# Patient Record
Sex: Male | Born: 1955 | Race: White | Hispanic: No | Marital: Married | Smoking: Current every day smoker
Health system: Southern US, Community
[De-identification: ages and names within clinical notes are randomized; demographics above are authoritative.]

## PROBLEM LIST (undated history)

## (undated) DIAGNOSIS — H409 Unspecified glaucoma: Secondary | ICD-10-CM

## (undated) DIAGNOSIS — Z87891 Personal history of nicotine dependence: Secondary | ICD-10-CM

## (undated) DIAGNOSIS — C3490 Malignant neoplasm of unspecified part of unspecified bronchus or lung: Secondary | ICD-10-CM

## (undated) DIAGNOSIS — Z87442 Personal history of urinary calculi: Secondary | ICD-10-CM

## (undated) DIAGNOSIS — R6520 Severe sepsis without septic shock: Secondary | ICD-10-CM

## (undated) DIAGNOSIS — K469 Unspecified abdominal hernia without obstruction or gangrene: Secondary | ICD-10-CM

## (undated) DIAGNOSIS — F329 Major depressive disorder, single episode, unspecified: Secondary | ICD-10-CM

## (undated) DIAGNOSIS — Z1389 Encounter for screening for other disorder: Secondary | ICD-10-CM

## (undated) DIAGNOSIS — Z933 Colostomy status: Secondary | ICD-10-CM

## (undated) DIAGNOSIS — M1710 Unilateral primary osteoarthritis, unspecified knee: Secondary | ICD-10-CM

## (undated) DIAGNOSIS — K5792 Diverticulitis of intestine, part unspecified, without perforation or abscess without bleeding: Secondary | ICD-10-CM

## (undated) DIAGNOSIS — F32A Depression, unspecified: Secondary | ICD-10-CM

## (undated) DIAGNOSIS — C801 Malignant (primary) neoplasm, unspecified: Secondary | ICD-10-CM

## (undated) DIAGNOSIS — Z95828 Presence of other vascular implants and grafts: Secondary | ICD-10-CM

## (undated) DIAGNOSIS — M179 Osteoarthritis of knee, unspecified: Secondary | ICD-10-CM

## (undated) DIAGNOSIS — Z720 Tobacco use: Secondary | ICD-10-CM

## (undated) DIAGNOSIS — I10 Essential (primary) hypertension: Secondary | ICD-10-CM

## (undated) DIAGNOSIS — K6389 Other specified diseases of intestine: Secondary | ICD-10-CM

## (undated) DIAGNOSIS — N200 Calculus of kidney: Secondary | ICD-10-CM

## (undated) DIAGNOSIS — D649 Anemia, unspecified: Secondary | ICD-10-CM

## (undated) DIAGNOSIS — R0600 Dyspnea, unspecified: Secondary | ICD-10-CM

## (undated) DIAGNOSIS — J9620 Acute and chronic respiratory failure, unspecified whether with hypoxia or hypercapnia: Secondary | ICD-10-CM

## (undated) DIAGNOSIS — G709 Myoneural disorder, unspecified: Secondary | ICD-10-CM

## (undated) DIAGNOSIS — A419 Sepsis, unspecified organism: Secondary | ICD-10-CM

## (undated) DIAGNOSIS — F1290 Cannabis use, unspecified, uncomplicated: Secondary | ICD-10-CM

## (undated) DIAGNOSIS — R7303 Prediabetes: Secondary | ICD-10-CM

## (undated) DIAGNOSIS — Z1211 Encounter for screening for malignant neoplasm of colon: Secondary | ICD-10-CM

## (undated) DIAGNOSIS — C3411 Malignant neoplasm of upper lobe, right bronchus or lung: Secondary | ICD-10-CM

## (undated) DIAGNOSIS — Z8614 Personal history of Methicillin resistant Staphylococcus aureus infection: Secondary | ICD-10-CM

## (undated) DIAGNOSIS — Z972 Presence of dental prosthetic device (complete) (partial): Secondary | ICD-10-CM

## (undated) DIAGNOSIS — J189 Pneumonia, unspecified organism: Secondary | ICD-10-CM

## (undated) DIAGNOSIS — J449 Chronic obstructive pulmonary disease, unspecified: Secondary | ICD-10-CM

## (undated) DIAGNOSIS — E876 Hypokalemia: Secondary | ICD-10-CM

## (undated) DIAGNOSIS — E119 Type 2 diabetes mellitus without complications: Secondary | ICD-10-CM

## (undated) HISTORY — DX: Encounter for screening for other disorder: Z13.89

## (undated) HISTORY — DX: Calculus of kidney: N20.0

## (undated) HISTORY — DX: Diverticulitis of intestine, part unspecified, without perforation or abscess without bleeding: K57.92

## (undated) HISTORY — PX: COLONOSCOPY: SHX174

## (undated) HISTORY — PX: BACK SURGERY: SHX140

## (undated) HISTORY — DX: Unspecified abdominal hernia without obstruction or gangrene: K46.9

## (undated) HISTORY — DX: Unspecified glaucoma: H40.9

## (undated) HISTORY — PX: EYE SURGERY: SHX253

## (undated) HISTORY — PX: JOINT REPLACEMENT: SHX530

## (undated) HISTORY — DX: Encounter for screening for malignant neoplasm of colon: Z12.11

## (undated) HISTORY — DX: Personal history of nicotine dependence: Z87.891

## (undated) MED FILL — Dexamethasone Sodium Phosphate Inj 100 MG/10ML: INTRAMUSCULAR | Qty: 1 | Status: AC

## (undated) MED FILL — Iron Sucrose Inj 20 MG/ML (Fe Equiv): INTRAVENOUS | Qty: 10 | Status: AC

---
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
## 1977-10-04 DIAGNOSIS — K469 Unspecified abdominal hernia without obstruction or gangrene: Secondary | ICD-10-CM

## 1977-10-04 HISTORY — PX: HERNIA REPAIR: SHX51

## 1977-10-04 HISTORY — DX: Unspecified abdominal hernia without obstruction or gangrene: K46.9

## 2010-10-15 ENCOUNTER — Ambulatory Visit: Payer: Self-pay

## 2010-11-11 ENCOUNTER — Ambulatory Visit: Payer: Self-pay | Admitting: Anesthesiology

## 2010-11-13 ENCOUNTER — Ambulatory Visit: Payer: Self-pay | Admitting: Anesthesiology

## 2010-12-10 ENCOUNTER — Ambulatory Visit: Payer: Self-pay | Admitting: Anesthesiology

## 2010-12-30 ENCOUNTER — Ambulatory Visit: Payer: Self-pay | Admitting: Anesthesiology

## 2011-01-20 ENCOUNTER — Ambulatory Visit: Payer: Self-pay | Admitting: Anesthesiology

## 2011-08-23 ENCOUNTER — Ambulatory Visit: Payer: Self-pay

## 2011-10-05 HISTORY — PX: LITHOTRIPSY: SUR834

## 2012-01-07 ENCOUNTER — Emergency Department: Payer: Self-pay | Admitting: *Deleted

## 2012-01-07 LAB — COMPREHENSIVE METABOLIC PANEL
Albumin: 3.8 g/dL (ref 3.4–5.0)
Alkaline Phosphatase: 77 U/L (ref 50–136)
Calcium, Total: 8.6 mg/dL (ref 8.5–10.1)
Creatinine: 0.95 mg/dL (ref 0.60–1.30)
EGFR (African American): >60
EGFR (Non-African Amer.): >60
Glucose: 159 mg/dL — ABNORMAL HIGH (ref 65–99)
Osmolality: 283 (ref 275–301)
SGOT(AST): 28 U/L (ref 15–37)
Sodium: 141 mmol/L (ref 136–145)

## 2012-01-07 LAB — URINALYSIS, COMPLETE
Bacteria: NONE SEEN
Leukocyte Esterase: NEGATIVE
Ph: 5 (ref 4.5–8.0)
RBC,UR: 8 /HPF (ref 0–5)
Specific Gravity: 1.02 (ref 1.003–1.030)
WBC UR: 1 /HPF (ref 0–5)

## 2012-01-07 LAB — CBC
HCT: 42.8 % (ref 40.0–52.0)
MCH: 31.7 pg (ref 26.0–34.0)
MCHC: 33.8 g/dL (ref 32.0–36.0)
MCV: 94 fL (ref 80–100)
Platelet: 211 10*3/uL (ref 150–440)
RBC: 4.56 10*6/uL (ref 4.40–5.90)
RDW: 13.5 % (ref 11.5–14.5)
WBC: 18.1 10*3/uL — ABNORMAL HIGH (ref 3.8–10.6)

## 2012-01-07 LAB — LIPASE, BLOOD: Lipase: 163 U/L (ref 73–393)

## 2012-01-08 ENCOUNTER — Ambulatory Visit: Payer: Self-pay | Admitting: Urology

## 2012-01-08 ENCOUNTER — Inpatient Hospital Stay: Payer: Self-pay | Admitting: Internal Medicine

## 2012-01-08 LAB — COMPREHENSIVE METABOLIC PANEL
Alkaline Phosphatase: 69 U/L (ref 50–136)
Anion Gap: 11 (ref 7–16)
BUN: 12 mg/dL (ref 7–18)
Bilirubin,Total: 1.1 mg/dL — ABNORMAL HIGH (ref 0.2–1.0)
Calcium, Total: 8.4 mg/dL — ABNORMAL LOW (ref 8.5–10.1)
Creatinine: 1.08 mg/dL (ref 0.60–1.30)
EGFR (African American): >60
EGFR (Non-African Amer.): >60
Potassium: 3.8 mmol/L (ref 3.5–5.1)
SGPT (ALT): 20 U/L
Total Protein: 6.9 g/dL (ref 6.4–8.2)

## 2012-01-08 LAB — URINALYSIS, COMPLETE
Bacteria: NONE SEEN
Bilirubin,UR: NEGATIVE
Glucose,UR: 150 mg/dL (ref 0–75)
Ketone: NEGATIVE
Protein: 30
RBC,UR: 129 /HPF (ref 0–5)
Specific Gravity: 1.03 (ref 1.003–1.030)
Squamous Epithelial: <1
WBC UR: 4 /HPF (ref 0–5)

## 2012-01-08 LAB — CBC
HGB: 13.3 g/dL (ref 13.0–18.0)
MCH: 30.8 pg (ref 26.0–34.0)
MCHC: 33.2 g/dL (ref 32.0–36.0)
Platelet: 198 10*3/uL (ref 150–440)
RDW: 12.5 % (ref 11.5–14.5)

## 2012-01-09 LAB — CBC WITH DIFFERENTIAL/PLATELET
Basophil #: 0 10*3/uL (ref 0.0–0.1)
Basophil %: 0.2 %
Eosinophil #: 0 10*3/uL (ref 0.0–0.7)
Eosinophil %: 0.3 %
HCT: 33.3 % — ABNORMAL LOW (ref 40.0–52.0)
HGB: 11.3 g/dL — ABNORMAL LOW (ref 13.0–18.0)
Lymphocyte #: 0.6 10*3/uL — ABNORMAL LOW (ref 1.0–3.6)
MCHC: 34 g/dL (ref 32.0–36.0)
Monocyte #: 0.5 10*3/uL (ref 0.0–0.7)
Monocyte %: 3.8 %
Neutrophil %: 91.1 %
Platelet: 154 10*3/uL (ref 150–440)
RDW: 13.9 % (ref 11.5–14.5)
WBC: 13.9 10*3/uL — ABNORMAL HIGH (ref 3.8–10.6)

## 2012-01-09 LAB — MAGNESIUM: Magnesium: 1.7 mg/dL — ABNORMAL LOW

## 2012-01-09 LAB — BASIC METABOLIC PANEL
Anion Gap: 11 (ref 7–16)
Calcium, Total: 7.9 mg/dL — ABNORMAL LOW (ref 8.5–10.1)
Creatinine: 0.9 mg/dL (ref 0.60–1.30)
EGFR (African American): >60
EGFR (Non-African Amer.): >60
Glucose: 104 mg/dL — ABNORMAL HIGH (ref 65–99)
Osmolality: 287 (ref 275–301)
Potassium: 3.4 mmol/L — ABNORMAL LOW (ref 3.5–5.1)
Sodium: 144 mmol/L (ref 136–145)

## 2012-01-10 LAB — CBC WITH DIFFERENTIAL/PLATELET
Basophil %: 0.1 %
Basophil %: 0.4 %
Eosinophil #: 0 10*3/uL (ref 0.0–0.7)
Eosinophil #: 0.1 10*3/uL (ref 0.0–0.7)
Eosinophil %: 1 %
HCT: 35.6 % — ABNORMAL LOW (ref 40.0–52.0)
HCT: 33.8 % — ABNORMAL LOW (ref 40.0–52.0)
HGB: 11.7 g/dL — ABNORMAL LOW (ref 13.0–18.0)
Lymphocyte #: 0.6 10*3/uL — ABNORMAL LOW (ref 1.0–3.6)
Lymphocyte #: 0.5 10*3/uL — ABNORMAL LOW (ref 1.0–3.6)
Lymphocyte %: 4.5 %
Lymphocyte %: 4.1 %
MCH: 32 pg (ref 26.0–34.0)
MCH: 32.1 pg (ref 26.0–34.0)
MCHC: 34.1 g/dL (ref 32.0–36.0)
MCV: 94 fL (ref 80–100)
MCV: 93 fL (ref 80–100)
Monocyte #: 0.4 10*3/uL (ref 0.0–0.7)
Monocyte #: 0.2 10*3/uL (ref 0.0–0.7)
Monocyte %: 3.3 %
Monocyte %: 1.9 %
Neutrophil #: 10.9 10*3/uL — ABNORMAL HIGH (ref 1.4–6.5)
Neutrophil %: 91.7 %
Neutrophil %: 92.6 %
RBC: 3.63 10*6/uL — ABNORMAL LOW (ref 4.40–5.90)
RDW: 12.8 % (ref 11.5–14.5)
WBC: 11.7 10*3/uL — ABNORMAL HIGH (ref 3.8–10.6)

## 2012-01-10 LAB — MAGNESIUM: Magnesium: 1.8 mg/dL

## 2012-01-10 LAB — OCCULT BLOOD X 1 CARD TO LAB, STOOL: Occult Blood, Feces: NEGATIVE

## 2012-01-12 LAB — CBC WITH DIFFERENTIAL/PLATELET
Basophil #: 0 10*3/uL (ref 0.0–0.1)
Basophil %: 0 %
Eosinophil %: 0.8 %
HCT: 35.4 % — ABNORMAL LOW (ref 40.0–52.0)
MCH: 31.9 pg (ref 26.0–34.0)
MCHC: 34.2 g/dL (ref 32.0–36.0)
MCV: 93 fL (ref 80–100)
Monocyte #: 0.9 x10 3/mm (ref 0.2–1.0)
Neutrophil %: 87.2 %
RBC: 3.79 10*6/uL — ABNORMAL LOW (ref 4.40–5.90)
WBC: 13.1 10*3/uL — ABNORMAL HIGH (ref 3.8–10.6)

## 2012-02-03 ENCOUNTER — Ambulatory Visit: Payer: Self-pay | Admitting: Urology

## 2012-06-22 ENCOUNTER — Ambulatory Visit: Payer: Self-pay | Admitting: Urology

## 2013-02-01 ENCOUNTER — Ambulatory Visit: Payer: Self-pay | Admitting: Hematology and Oncology

## 2013-02-07 LAB — CBC CANCER CENTER
Basophil #: 0.1 x10 3/mm (ref 0.0–0.1)
Basophil %: 1 %
Eosinophil #: 0.3 x10 3/mm (ref 0.0–0.7)
HCT: 43.8 % (ref 40.0–52.0)
HGB: 15.1 g/dL (ref 13.0–18.0)
Lymphocyte #: 2.5 x10 3/mm (ref 1.0–3.6)
MCH: 31.6 pg (ref 26.0–34.0)
MCHC: 34.4 g/dL (ref 32.0–36.0)
Monocyte #: 0.8 x10 3/mm (ref 0.2–1.0)
Monocyte %: 8.4 %
Platelet: 219 x10 3/mm (ref 150–440)
RBC: 4.77 10*6/uL (ref 4.40–5.90)
RDW: 14 % (ref 11.5–14.5)
WBC: 9.8 x10 3/mm (ref 3.8–10.6)

## 2013-03-04 ENCOUNTER — Ambulatory Visit: Payer: Self-pay | Admitting: Hematology and Oncology

## 2013-03-26 ENCOUNTER — Encounter: Payer: Self-pay | Admitting: General Surgery

## 2013-03-26 ENCOUNTER — Ambulatory Visit (INDEPENDENT_AMBULATORY_CARE_PROVIDER_SITE_OTHER): Payer: BC Managed Care – PPO | Admitting: General Surgery

## 2013-03-26 VITALS — BP 140/80 | HR 74 | Resp 12 | Ht 71.0 in | Wt 201.0 lb

## 2013-03-26 DIAGNOSIS — K5732 Diverticulitis of large intestine without perforation or abscess without bleeding: Secondary | ICD-10-CM

## 2013-03-26 NOTE — Progress Notes (Signed)
Patient ID: Rodney Vega, male   DOB: [birth year], 57 y.o.   MRN: [id number]  Chief Complaint  Patient presents with  . Other    divertculitis    HPI Rodney Vega is a 57 y.o. male here today for diverculitis. He states approximately 1 year ago he started having abdominal pains. The first episode required in hospital treatment. He has been diagnosed with diverculitis since that time in April 2013. CT scan confirmed the diagnosis at that time. He states he has been to the doctor approximately 8 times due to this issue. He has been put on antibiotics for this several times in the past yr. The most recent episode was in April 2014. The pain is most in the super pubic abdominal area.  HPI  Past Medical History  Diagnosis Date  . Diverticulitis   . Kidney stone     Past Surgical History  Procedure Laterality Date  . Back surgery  1994, 2012  . Hernia repair  1979  . Tonsillectomy and adenoidectomy  1962    History reviewed. No pertinent family history.  Social History History  Substance Use Topics  . Smoking status: Current Every Day Smoker -- 1.00 packs/day for 20 years  . Smokeless tobacco: Not on file  . Alcohol Use: Yes    No Known Allergies  Current Outpatient Prescriptions  Medication Sig Dispense Refill  . calcium citrate (CALCITRATE - DOSED IN MG ELEMENTAL CALCIUM) 950 MG tablet Take 1 tablet by mouth daily.      . Probiotic Product (PROBIOTIC DAILY PO) Take 1 tablet by mouth daily.       No current facility-administered medications for this visit.    Review of Systems Review of Systems  Constitutional: Negative.   Respiratory: Negative.   Cardiovascular: Negative.   Gastrointestinal: Negative.     Blood pressure 140/80, pulse 74, resp. rate 12, height 5\' 11"  (1.803 m), weight 201 lb (91.173 kg).  Physical Exam Physical Exam  Constitutional: He is oriented to person, place, and time. He appears well-developed and well-nourished.  Eyes: Conjunctivae are  normal. No scleral icterus.  Neck: Trachea normal. No mass and no thyromegaly present.  Cardiovascular: Normal rate, regular rhythm, normal heart sounds and normal pulses.   No murmur heard. Pulmonary/Chest: Effort normal. He has wheezes (scattered bilateral).  Abdominal: Soft. Normal appearance and bowel sounds are normal. There is no hepatosplenomegaly. There is no tenderness. No hernia.  Neurological: He is alert and oriented to person, place, and time.  Skin: Skin is warm and dry.    Data Reviewed  Prior CT and Dr. Orpah Cobb notes  Assessment    Reoccurring diverticulitis of sigmoid colon. I feel it's reasonable to consider sigmoid colectomy. This was discussed in full with patient. The risks and benefits explained to him. Remote need for colostomy and potential complications of surgery explained to him. Patient continues to smoke and advised on the importance of quitting.     Plan    Would like a repeat CT to assess for surgical planning.        Nafeesa Dils G 03/26/2013, 9:05 PM

## 2013-03-26 NOTE — Patient Instructions (Addendum)
Patient to have repeat abdominal CT scan for surgical stand point. Surgery to be schedule after CT Scan. CT scan scheduled at [facility] on Thursday June 26 at 3:00 pm.  Patient to be NPO four hours prior and to pick up a prep kit today.

## 2013-03-29 ENCOUNTER — Ambulatory Visit: Payer: Self-pay | Admitting: General Surgery

## 2013-04-03 ENCOUNTER — Telehealth: Payer: Self-pay | Admitting: *Deleted

## 2013-04-03 NOTE — Telephone Encounter (Signed)
Patient had called asking about CT results.  Appt made to review CT results per Dr.Sankar for Thursday at 3:00.

## 2013-04-04 ENCOUNTER — Telehealth: Payer: Self-pay

## 2013-04-04 NOTE — Telephone Encounter (Signed)
Peg called and reported the patient had a temp of 100 at 3:00 am today, was given Tylenol with good effect. Was concerned because twice when he has urinated he is having some red tinted stringy like discharge. Did no know if this was normal or something to be concerned about.

## 2013-04-04 NOTE — Telephone Encounter (Signed)
Encouraged to drink plenty of water. He does have a history of kidney stones. Follow up as scheduled

## 2013-04-05 ENCOUNTER — Ambulatory Visit (INDEPENDENT_AMBULATORY_CARE_PROVIDER_SITE_OTHER): Payer: BC Managed Care – PPO | Admitting: General Surgery

## 2013-04-05 ENCOUNTER — Encounter: Payer: Self-pay | Admitting: General Surgery

## 2013-04-05 VITALS — BP 130/70 | HR 82 | Temp 99.7°F | Resp 14 | Ht 71.0 in | Wt 199.0 lb

## 2013-04-05 DIAGNOSIS — K5732 Diverticulitis of large intestine without perforation or abscess without bleeding: Secondary | ICD-10-CM

## 2013-04-05 MED ORDER — PEG 3350-KCL-NABCB-NACL-NASULF 236 G PO SOLR: 4.0000 L | Freq: Once | ORAL | Status: DC

## 2013-04-05 MED ORDER — AMOXICILLIN-POT CLAVULANATE 875-125 MG PO TABS: 1.0000 | ORAL_TABLET | Freq: Two times a day (BID) | ORAL | Status: AC

## 2013-04-05 MED ORDER — METRONIDAZOLE 500 MG PO TABS: 500.0000 mg | ORAL_TABLET | ORAL | Status: AC

## 2013-04-05 MED ORDER — NEOMYCIN SULFATE 500 MG PO TABS: ORAL_TABLET | ORAL | Status: DC

## 2013-04-05 NOTE — Patient Instructions (Addendum)
Schedule for colon surgery Take antibiotics as directed   Laparoscopic Colon Resection Laparoscopic colon resection is a relatively new procedure and is not performed in all centers. It may be done to remove a piece of the colon (large intestine) that may be sore and reddened (inflamed). It may be done to remove a portion of bowel that is blocked. The intestine may be blocked because of colon cancer. It is sometimes used to treat diseases of the bowel in which there are multiple small outgrowths from the bowel wall (polyps), which may predispose a person to cancer. LET YOUR CAREGIVER KNOW ABOUT:  Allergies.  Medications taken including herbs, eye drops, over the counter medications, and creams.  Use of steroids (by mouth or creams).  Previous problems with anesthetics or novocaine.  Possibility of pregnancy, if this applies.  History of blood clots (thrombophlebitis).  History of bleeding or blood problems.  Previous surgery.  Other health problems. RISKS AND COMPLICATIONS Some problems, which occur following this procedure, include:  Infection: A germ starts growing in the wound. This can usually be treated with medicine that kills germs (antibiotics).  Bleeding following surgery may be a complication of almost all surgeries. Your surgeon takes every precaution to keep this from happening.  Damage to other organs may occur. If damage to other organs or excessive bleeding should occur it may be necessary to convert the laparoscopic procedure into an open abdominal (belly) procedure. This means the surgery is performed by opening the abdomen and performing the surgery under direct vision. Scarring from previous surgeries or disease may also be a cause to change this procedure to an open abdominal operation.  Sometimes a leak can occur in the line where the bowel was sewn together after the portion of bowel was removed.  It is possible for the bowel to become obstructed in the area  where it was sewn together. When this happens, it is sometimes necessary to operate again to repair this. This may be accomplished using the laparoscope or opening the abdomen and operating in the usual manner without the laparoscope. BEFORE THE PROCEDURE You should be present 2 hours prior to your procedure or as instructed.  PROCEDURE  Laparoscopic means a laparoscope (a small pencil sized telescope) is used. You are made to sleep with medicine (anesthetized). Your surgeon inflates your belly (abdomen) with a needle like device (trocar and cannula). The inflation is done with a harmless gas (carbon dioxide). This makes your organs easier to see. The laparoscope is inserted into your abdomen through a small slit (incision) that allows your surgeon to see into the abdomen. Other small instruments, such as probes and operating instruments, are inserted into the abdomen through other small openings (ports). These ports allow the surgeon to perform the operation. Often surgeons attach a video camera to the laparoscope to enlarge the view. During the procedure the portion of bowel to be removed is taken out through one of the ports. A port may have to be enlarged if the bowel is too large to be removed. In this case a small incision will be made and some times the bowel is reconnected (anastamosis) outside the abdomen. After the procedure, the gas is released, and your incisions are closed with stitches (sutures). Because these incisions are small (usually less than one-half inch), there is usually minimal discomfort following the procedure. AFTER THE PROCEDURE The recovery time, if there are no problems, is shortened compared to regular surgery. You will rest in a recovery room until  you are stable and doing well. Following this, barring other problems you will be allowed to return to your room. Recovery times vary depending on what is found at surgery, the age of the patient, general health, etc. SEEK IMMEDIATE  MEDICAL CARE IF:   There is redness, swelling, or increasing pain in the wound area.  Pus is coming from the wound.  An unexplained oral temperature above 102 F (38.9 C) develops or as directed.  You notice a foul smell coming from the wound or dressing.  There is a breaking open of a wound (edges not staying together) after sutures have been removed.  You develop increasing abdominal pain. Document Released: 12/11/2002 Document Revised: 12/13/2011 Document Reviewed: 10/20/2007 [facility] Patient Information 2014 [place], [place].  Patient's surgery has been scheduled for 04-20-13 at [facility]. This patient will need to do a bowel prep and has been given instructions.

## 2013-04-05 NOTE — Progress Notes (Signed)
Patient ID: Rodney Vega, male   DOB: 02-Oct-1956, 57 y.o.   MRN: [id number]  Chief Complaint  Patient presents with  . Other    discussion      Rodney Vega is a 57 y.o. male here to discuss CT results. Patient has had a fever on Sunday and then again Wednesday, both relieved with Tylenol. Patient also reports two incidences of some reddish tinted urine with mucus like discharge while urinating. CT completed on 03/29/13. Patient does not report any pain, just some discomfort while urinating. HPI  Past Medical History  Diagnosis Date  . Diverticulitis   . Kidney stone     Past Surgical History  Procedure Laterality Date  . Back surgery  1994, 2012  . Hernia repair  1979  . Tonsillectomy and adenoidectomy  1962    History reviewed. No pertinent family history.  Social History History  Substance Use Topics  . Smoking status: Current Every Day Smoker -- 1.00 packs/day for 20 years  . Smokeless tobacco: Never Used  . Alcohol Use: Yes    No Known Allergies  Current Outpatient Prescriptions  Medication Sig Dispense Refill  . calcium citrate (CALCITRATE - DOSED IN MG ELEMENTAL CALCIUM) 950 MG tablet Take 1 tablet by mouth daily.      . Probiotic Product (PROBIOTIC DAILY PO) Take 1 tablet by mouth daily.      Marland Kitchen amoxicillin-clavulanate (AUGMENTIN) 875-125 MG per tablet Take 1 tablet by mouth 2 (two) times daily.  14 tablet  0  . metroNIDAZOLE (FLAGYL) 500 MG tablet Take 1 tablet (500 mg total) by mouth See admin instructions. Take one (1) tablet at 6 PM and one (1) tablets at 11 PM the night prior to surgery.  4 tablet  0  . neomycin (MYCIFRADIN) 500 MG tablet Take two (2) tablets at 6 pm and two (2) tablets at 11 pm  4 tablet  0  . polyethylene glycol (GOLYTELY) 236 G solution Take 4,000 mLs by mouth once.  4000 mL  0   No current facility-administered medications for this visit.    Review of Systems Review of Systems  Constitutional: Positive for fever and fatigue. Negative  for chills, diaphoresis, activity change, appetite change and unexpected weight change.  Gastrointestinal: Negative.   Recent onset of some mucoid material in urine.  Blood pressure 130/70, pulse 82, temperature 99.7 F (37.6 C), resp. rate 14, height 5\' 11"  (1.803 m), weight 199 lb (90.266 kg).  Physical Exam Physical Exam  Data Reviewed CT  Assessment    persistent active sigmoid diverticulitis low grade.  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 This was discussed in detail with the patient including the risks and benefits. Patient is agreeable.     Patient's surgery has been scheduled for 04-20-13 at [facility]. This patient will need to do a bowel prep and has been given instructions.    SANKAR,SEEPLAPUTHUR G 04/06/2013, 7:55 AM

## 2013-04-05 NOTE — Progress Notes (Deleted)
Patient ID: Rodney Vega, male   DOB: 05-28-1956, 57 y.o.   MRN: [id number]  Chief Complaint  Patient presents with  . Other    discussion     HPI Rodney Vega is a 57 y.o. male.  *** HPI  Past Medical History  Diagnosis Date  . Diverticulitis   . Kidney stone     Past Surgical History  Procedure Laterality Date  . Back surgery  1994, 2012  . Hernia repair  1979  . Tonsillectomy and adenoidectomy  1962    No family history on file.  Social History History  Substance Use Topics  . Smoking status: Current Every Day Smoker -- 1.00 packs/day for 20 years  . Smokeless tobacco: Not on file  . Alcohol Use: Yes    No Known Allergies  Current Outpatient Prescriptions  Medication Sig Dispense Refill  . calcium citrate (CALCITRATE - DOSED IN MG ELEMENTAL CALCIUM) 950 MG tablet Take 1 tablet by mouth daily.      . Probiotic Product (PROBIOTIC DAILY PO) Take 1 tablet by mouth daily.       No current facility-administered medications for this visit.    Review of Systems Review of Systems  Constitutional: Negative.   Respiratory: Negative.   Cardiovascular: Negative.     There were no vitals taken for this visit.  Physical Exam Physical Exam  Data Reviewed ***  Assessment    ***    Plan    ***       Currie Paris 04/05/2013, 2:55 PM

## 2013-04-06 ENCOUNTER — Encounter: Payer: Self-pay | Admitting: General Surgery

## 2013-04-09 ENCOUNTER — Other Ambulatory Visit: Payer: Self-pay | Admitting: General Surgery

## 2013-04-09 ENCOUNTER — Telehealth: Payer: Self-pay | Admitting: *Deleted

## 2013-04-09 DIAGNOSIS — K5732 Diverticulitis of large intestine without perforation or abscess without bleeding: Secondary | ICD-10-CM

## 2013-04-09 NOTE — Telephone Encounter (Signed)
Rodney Vega at Dr. Ramon Dredge office was notified that Dr. [name] would need to be on standby for patient's surgery on 04-20-13 due to possible colovesical fistula.

## 2013-04-11 ENCOUNTER — Telehealth: Payer: Self-pay | Admitting: General Surgery

## 2013-04-11 NOTE — Telephone Encounter (Signed)
Pt reportedly still having mild pain. Says he did better with Cipro and Flagyl. Rx called in for both to [facility] pharmacy. Advised to report any new problems, otherwise will proceed with surgery as planned on 04/20/13. Also discussed with Dr. Durene Fruits regarding  the colovesical fistula. He will be available at time of surgery if needed.

## 2013-04-13 ENCOUNTER — Telehealth: Payer: Self-pay

## 2013-04-13 NOTE — Telephone Encounter (Signed)
Spoke with Gigi Gin and discussed the patients complaints of abdominal discomfort and decreased appetite and no fever. He is urinitating and having small bowel movements. She states he had sausage ball and gravy biscuit yesterday. Advised her to encourage clear liquids. Dr Lemar Livings was advised of patients concerns. He reccommended if severe abdominal pain then he is to go to the ER. Instructions reviewed with Gigi Gin and she is agreeable to try clear liquids, and follow up with our office as to the patients progress.(Per Collene Schlichter, RN)

## 2013-04-13 NOTE — Telephone Encounter (Signed)
Rodney Vega called and said that Rodney Vega was not feeling any better and had been experiencing some weakness, decreased appetite, and some discomfort. She said that he was not eating or drinking very much. She says that he has thrown up some twice since Tuesday. She says that he has not had any fever in the past two days.

## 2013-04-17 ENCOUNTER — Telehealth: Payer: Self-pay

## 2013-04-17 ENCOUNTER — Other Ambulatory Visit: Payer: Self-pay | Admitting: General Surgery

## 2013-04-17 DIAGNOSIS — K5732 Diverticulitis of large intestine without perforation or abscess without bleeding: Secondary | ICD-10-CM

## 2013-04-17 MED ORDER — CIPROFLOXACIN HCL 500 MG PO TABS: 500.0000 mg | ORAL_TABLET | Freq: Two times a day (BID) | ORAL | Status: AC

## 2013-04-17 MED ORDER — METRONIDAZOLE 500 MG PO TABS: 500.0000 mg | ORAL_TABLET | Freq: Three times a day (TID) | ORAL | Status: DC

## 2013-04-17 NOTE — Telephone Encounter (Signed)
Peggy called and said that Rodney Vega has one dose of Cipro and Flagyl left for tonight. She stated that he is still having low grade fevers (99-100.8) off and on. These are controlled with the use of Tylenol. His eating and drinking have improved some. She would like to know if he needs refills of these antibiotics. He is scheduled for surgery on Friday July 18th with Dr Evette Cristal.

## 2013-04-18 ENCOUNTER — Encounter: Payer: Self-pay | Admitting: *Deleted

## 2013-04-18 NOTE — Telephone Encounter (Signed)
Notified Peggy that Dr Lemar Livings said she can pick up Zavior's antibiotic prescriptions at the pharmacy. She is to call us with any further questions or problems.

## 2013-04-19 ENCOUNTER — Ambulatory Visit: Payer: Self-pay | Admitting: General Surgery

## 2013-04-19 ENCOUNTER — Telehealth: Payer: Self-pay | Admitting: *Deleted

## 2013-04-19 LAB — BASIC METABOLIC PANEL
Calcium, Total: 8.8 mg/dL (ref 8.5–10.1)
EGFR (African American): >60
Osmolality: 282 (ref 275–301)

## 2013-04-19 LAB — CBC WITH DIFFERENTIAL/PLATELET
Basophil #: 0.1 10*3/uL (ref 0.0–0.1)
HCT: 38.4 % — ABNORMAL LOW (ref 40.0–52.0)
HGB: 13.1 g/dL (ref 13.0–18.0)
Lymphocyte %: 14.2 %
MCH: 30.9 pg (ref 26.0–34.0)
MCHC: 34.1 g/dL (ref 32.0–36.0)
MCV: 91 fL (ref 80–100)
Neutrophil #: 9 10*3/uL — ABNORMAL HIGH (ref 1.4–6.5)
Neutrophil %: 74.7 %
WBC: 12.1 10*3/uL — ABNORMAL HIGH (ref 3.8–10.6)

## 2013-04-19 NOTE — Telephone Encounter (Signed)
Patient's girlfriend called in to report patient is throwing up prep for surgery scheduled tomorrow, 04-20-13. A prescription for Zofran has been called into [facility] pharmacy.

## 2013-04-20 ENCOUNTER — Encounter: Payer: Self-pay | Admitting: General Surgery

## 2013-04-20 ENCOUNTER — Inpatient Hospital Stay: Payer: Self-pay | Admitting: General Surgery

## 2013-04-20 DIAGNOSIS — K660 Peritoneal adhesions (postprocedural) (postinfection): Secondary | ICD-10-CM

## 2013-04-20 DIAGNOSIS — N321 Vesicointestinal fistula: Secondary | ICD-10-CM

## 2013-04-20 DIAGNOSIS — K5732 Diverticulitis of large intestine without perforation or abscess without bleeding: Secondary | ICD-10-CM

## 2013-04-20 HISTORY — PX: LAPAROTOMY: SHX154

## 2013-04-20 HISTORY — PX: COLOSTOMY: SHX63

## 2013-04-20 LAB — CBC WITH DIFFERENTIAL/PLATELET
Basophil %: 0.3 %
Eosinophil #: 0 10*3/uL (ref 0.0–0.7)
Eosinophil %: 0 %
HCT: 33.4 % — ABNORMAL LOW (ref 40.0–52.0)
HGB: 10.9 g/dL — ABNORMAL LOW (ref 13.0–18.0)
Lymphocyte %: 3.4 %
MCHC: 32.6 g/dL (ref 32.0–36.0)
MCV: 91 fL (ref 80–100)
Monocyte #: 0.4 x10 3/mm (ref 0.2–1.0)
Monocyte %: 2.4 %
Platelet: 394 10*3/uL (ref 150–440)
RBC: 3.68 10*6/uL — ABNORMAL LOW (ref 4.40–5.90)
RDW: 13 % (ref 11.5–14.5)
WBC: 15.4 10*3/uL — ABNORMAL HIGH (ref 3.8–10.6)

## 2013-04-21 LAB — CBC WITH DIFFERENTIAL/PLATELET
Basophil %: 0.1 %
Eosinophil %: 0 %
HCT: 29.8 % — ABNORMAL LOW (ref 40.0–52.0)
HGB: 10.1 g/dL — ABNORMAL LOW (ref 13.0–18.0)
Lymphocyte %: 6.3 %
MCH: 30.5 pg (ref 26.0–34.0)
MCHC: 33.9 g/dL (ref 32.0–36.0)
MCV: 90 fL (ref 80–100)
Monocyte #: 1 x10 3/mm (ref 0.2–1.0)
Neutrophil %: 87.4 %
Platelet: 342 10*3/uL (ref 150–440)
RBC: 3.32 10*6/uL — ABNORMAL LOW (ref 4.40–5.90)
RDW: 13 % (ref 11.5–14.5)

## 2013-04-21 LAB — BASIC METABOLIC PANEL
Anion Gap: 7 (ref 7–16)
BUN: 10 mg/dL (ref 7–18)
Chloride: 107 mmol/L (ref 98–107)
Co2: 26 mmol/L (ref 21–32)
Creatinine: 0.96 mg/dL (ref 0.60–1.30)
EGFR (Non-African Amer.): >60
Glucose: 173 mg/dL — ABNORMAL HIGH (ref 65–99)
Sodium: 140 mmol/L (ref 136–145)

## 2013-04-22 LAB — CBC WITH DIFFERENTIAL/PLATELET
Basophil #: 0.1 10*3/uL (ref 0.0–0.1)
Eosinophil #: 0.1 10*3/uL (ref 0.0–0.7)
HCT: 26.2 % — ABNORMAL LOW (ref 40.0–52.0)
MCH: 31.5 pg (ref 26.0–34.0)
MCV: 90 fL (ref 80–100)
RBC: 2.92 10*6/uL — ABNORMAL LOW (ref 4.40–5.90)
WBC: 12.7 10*3/uL — ABNORMAL HIGH (ref 3.8–10.6)

## 2013-04-23 LAB — CBC WITH DIFFERENTIAL/PLATELET
Basophil %: 0.8 %
Eosinophil #: 0.2 10*3/uL (ref 0.0–0.7)
Eosinophil %: 1.8 %
HGB: 8.8 g/dL — ABNORMAL LOW (ref 13.0–18.0)
MCHC: 31.7 g/dL — ABNORMAL LOW (ref 32.0–36.0)
Monocyte %: 7.5 %
Neutrophil %: 73.9 %
Platelet: 342 10*3/uL (ref 150–440)
RBC: 3.06 10*6/uL — ABNORMAL LOW (ref 4.40–5.90)
WBC: 11 10*3/uL — ABNORMAL HIGH (ref 3.8–10.6)

## 2013-04-24 ENCOUNTER — Telehealth: Payer: Self-pay | Admitting: *Deleted

## 2013-04-24 LAB — PATHOLOGY REPORT

## 2013-04-24 MED ORDER — NYSTATIN 100000 UNIT/ML MT SUSP: 500000.0000 [IU] | Freq: Three times a day (TID) | OROMUCOSAL | Status: DC

## 2013-04-24 NOTE — Telephone Encounter (Signed)
Pt aware of RX 

## 2013-04-25 ENCOUNTER — Encounter: Payer: Self-pay | Admitting: General Surgery

## 2013-04-26 ENCOUNTER — Encounter: Payer: Self-pay | Admitting: General Surgery

## 2013-05-01 ENCOUNTER — Telehealth: Payer: Self-pay | Admitting: General Surgery

## 2013-05-01 ENCOUNTER — Encounter: Payer: Self-pay | Admitting: General Surgery

## 2013-05-01 ENCOUNTER — Ambulatory Visit (INDEPENDENT_AMBULATORY_CARE_PROVIDER_SITE_OTHER): Payer: BC Managed Care – PPO | Admitting: General Surgery

## 2013-05-01 VITALS — BP 156/78 | HR 74 | Resp 14 | Ht 71.0 in | Wt 184.0 lb

## 2013-05-01 DIAGNOSIS — K5732 Diverticulitis of large intestine without perforation or abscess without bleeding: Secondary | ICD-10-CM

## 2013-05-01 NOTE — Telephone Encounter (Signed)
05-01-13 PATIENTS FRIEND(PEGGE SPOON)CALLED AND WANTED TO SEE HOW LONG THE HOME HEALTH NURSE NEEDED TO COME?

## 2013-05-01 NOTE — Progress Notes (Signed)
Subjective:     Patient ID: Rodney Vega, male   DOB: 1956-01-14, 57 y.o.   MRN: [id number]  HPI Patient here today for post op visit, laparotomy with sigmoid resection and colostomy 04-20-13. [place] had repair of bladder   Sates he is getting along well. Foley catheter remains in place followed by Dr Sheppard Penton.  Review of Systems     Objective:   Physical Exam  Constitutional: He is oriented to person, place, and time. He appears well-developed and well-nourished.  Eyes: No scleral icterus.  Cardiovascular: Normal rate, regular rhythm and normal heart sounds.   Pulmonary/Chest: Breath sounds normal.  Abdominal: Soft. There is no tenderness.    Lymphadenopathy:    He has no cervical adenopathy.  Neurological: He is alert and oriented to person, place, and time.  Skin: Skin is warm and dry.       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     Plan:     Patient to return in four weeks.

## 2013-05-01 NOTE — Patient Instructions (Addendum)
Patient to return in 4 weeks. No physical activity.

## 2013-05-17 ENCOUNTER — Telehealth: Payer: Self-pay | Admitting: *Deleted

## 2013-05-17 NOTE — Telephone Encounter (Signed)
Left message FMLA papers ready Return to work date 06-21-13

## 2013-05-29 ENCOUNTER — Ambulatory Visit (INDEPENDENT_AMBULATORY_CARE_PROVIDER_SITE_OTHER): Payer: BC Managed Care – PPO | Admitting: General Surgery

## 2013-05-29 ENCOUNTER — Encounter: Payer: Self-pay | Admitting: General Surgery

## 2013-05-29 VITALS — BP 126/78 | HR 80 | Resp 13 | Ht 71.0 in | Wt 187.0 lb

## 2013-05-29 DIAGNOSIS — K5732 Diverticulitis of large intestine without perforation or abscess without bleeding: Secondary | ICD-10-CM

## 2013-05-29 NOTE — Progress Notes (Signed)
Patient ID: Rodney Vega, male   DOB: May 19, 1956, 57 y.o.   MRN: [id number]  Chief Complaint  Patient presents with  . Diverticulitis    surgery    HPI Rodney Vega is a 57 y.o. male. here today following up from his colon surgery and colostomy done 04-20-13.  States he is getting along well and adjusting to his colostomy.  HPI  Past Medical History  Diagnosis Date  . Diverticulitis   . Glaucoma     since 2004  . Hernia 1979  . Personal history of tobacco use, presenting hazards to health   . Kidney stone 2013  . Special screening for malignant neoplasms, colon   . Screening for obesity     Past Surgical History  Procedure Laterality Date  . Back surgery  1994, 2012  . Hernia repair  1979  . Tonsillectomy and adenoidectomy  1962  . Lithotripsy  2013  . Colonoscopy  4098,1191    [facility]/Dr. Carmell Austria sessile polyp,21mm, found in rectum,multiple diverticula were found in the sigmoid, in descending and in transverse colon  . Laparotomy  04-20-13    colon resection with colosotmy  . Colostomy  04-20-13    History reviewed. No pertinent family history.  Social History History  Substance Use Topics  . Smoking status: Current Every Day Smoker -- 1.00 packs/day for 20 years  . Smokeless tobacco: Never Used  . Alcohol Use: Yes    Allergies  Allergen Reactions  . Aleve [Naproxen Sodium] Itching    No current outpatient prescriptions on file.   No current facility-administered medications for this visit.    Review of Systems Review of Systems  Constitutional: Negative.   Respiratory: Negative.   Cardiovascular: Negative.     Blood pressure 126/78, pulse 80, resp. rate 13, height 5\' 11"  (1.803 m), weight 187 lb (84.823 kg).  Physical Exam Physical Exam  Constitutional: He is oriented to person, place, and time. He appears well-developed and well-nourished.  Cardiovascular: Normal rate and regular rhythm.   Pulmonary/Chest: Effort normal and breath sounds normal.   Abdominal: Soft.  Lower midline incision well healed Colostomy intact and functioning well LLQ  Neurological: He is alert and oriented to person, place, and time.  Skin: Skin is warm and dry.    Data Reviewed none  Assessment    Stable.     Plan    Follow up in November- will plan for reversal of colostomy in December.       SANKAR,SEEPLAPUTHUR G 05/29/2013, 2:21 PM

## 2013-05-29 NOTE — Patient Instructions (Addendum)
The patient is aware to call back for any questions or concerns. Resume physical activity as tolerated Pace back into a routine gradually return to work Sept 8 2014 Consider reversal of colostomy Dec 2014

## 2013-08-27 ENCOUNTER — Ambulatory Visit (INDEPENDENT_AMBULATORY_CARE_PROVIDER_SITE_OTHER): Payer: BC Managed Care – PPO | Admitting: General Surgery

## 2013-08-27 ENCOUNTER — Encounter: Payer: Self-pay | Admitting: General Surgery

## 2013-08-27 VITALS — BP 124/80 | HR 80 | Resp 16 | Ht 71.0 in | Wt 204.0 lb

## 2013-08-27 DIAGNOSIS — K5732 Diverticulitis of large intestine without perforation or abscess without bleeding: Secondary | ICD-10-CM

## 2013-08-27 DIAGNOSIS — Z933 Colostomy status: Secondary | ICD-10-CM

## 2013-08-27 MED ORDER — NEOMYCIN SULFATE 500 MG PO TABS: ORAL_TABLET | ORAL | Status: DC

## 2013-08-27 MED ORDER — POLYETHYLENE GLYCOL 3350 17 GM/SCOOP PO POWD: ORAL | Status: DC

## 2013-08-27 MED ORDER — METRONIDAZOLE 500 MG PO TABS: ORAL_TABLET | ORAL | Status: DC

## 2013-08-27 NOTE — Progress Notes (Signed)
Patient ID: Rodney Vega, male   DOB: Feb 23, 1956, 57 y.o.   MRN: [id number]  Chief Complaint  Patient presents with  . Other    diverticulitis    HPI Rodney Vega is a 57 y.o. male here today for his 3 months diverticulitis. Patient states he is doing  well.  He developed colovesical fistula, underwent sigmoid resection, bladder repair and colostomy in July, 2014. He denies any urinary or bowel symptoms. Appetite better, has gained back some of his weight. HPI  Past Medical History  Diagnosis Date  . Diverticulitis   . Glaucoma     since 2004  . Hernia 1979  . Personal history of tobacco use, presenting hazards to health   . Kidney stone 2013  . Special screening for malignant neoplasms, colon   . Screening for obesity     Past Surgical History  Procedure Laterality Date  . Back surgery  1994, 2012  . Hernia repair  1979  . Tonsillectomy and adenoidectomy  1962  . Lithotripsy  2013  . Colonoscopy  4098,1191    [facility]/Dr. Carmell Austria sessile polyp,44mm, found in rectum,multiple diverticula were found in the sigmoid, in descending and in transverse colon  . Laparotomy  04-20-13    colon resection with colosotmy  . Colostomy  04-20-13    History reviewed. No pertinent family history.  Social History History  Substance Use Topics  . Smoking status: Current Every Day Smoker -- 1.00 packs/day for 20 years  . Smokeless tobacco: Never Used  . Alcohol Use: Yes    Allergies  Allergen Reactions  . Aleve [Naproxen Sodium] Itching    Current Outpatient Prescriptions  Medication Sig Dispense Refill  . acetaminophen (TYLENOL) 325 MG tablet Take 325 mg by mouth every 6 (six) hours as needed.        No current facility-administered medications for this visit.    Review of Systems Review of Systems  Constitutional: Negative.   Respiratory: Negative.   Cardiovascular: Negative.     Blood pressure 124/80, pulse 80, resp. rate 16, height 5\' 11"  (1.803 m), weight 204 lb (92.534  kg).  Physical Exam Physical Exam  Constitutional: He is oriented to person, place, and time. He appears well-developed and well-nourished.  Eyes: Conjunctivae are normal. No scleral icterus.  Neck: No mass and no thyromegaly present.  Cardiovascular: Normal rate, regular rhythm and normal heart sounds.   Pulmonary/Chest: Breath sounds normal.  Abdominal: Soft. Normal appearance and bowel sounds are normal. There is no hepatomegaly. There is no tenderness. No hernia.  Left lower quadrant colostomy is intact and functioning well.   Lymphadenopathy:    He has no cervical adenopathy.  Neurological: He is alert and oriented to person, place, and time.  Skin: Skin is warm and dry.    Data Reviewed Previous op note  Assessment    Patient 6 months post sigmoid resection with colostomy. Ok to proceed with reversal of colostomy.     Plan    Patient to be scheduled for surgery. Procedure explained to him.     This patient's surgery has been scheduled for 09-14-13 at [facility].   [name] 08/27/2013, 9:13 AM

## 2013-08-27 NOTE — Patient Instructions (Addendum)
.  End Colostomy Reversal An end colostomy reversal is surgery that reverses an end colostomy. The large intestine is disconnected from the opening in the abdomen (stoma). It is then reconnected to the large intestine inside the body. A stoma and pouch are no longer needed. Bowel movements can resume through the rectum. LET YOUR CAREGIVER KNOW ABOUT:  Allergies to food or medicine.  Medicines taken, including vitamins, health supplements, herbs, eyedrops, over-the-counter medicines, and creams.  Use of steroids (by mouth or creams).  Previous problems with anesthetics or numbing medicines.  History of bleeding problems or blood clots.  Previous surgery.  Other health problems, including diabetes and kidney problems.  Possibility of pregnancy, if this applies. RISKS AND COMPLICATIONS General surgical complications may include:  Reaction to anesthesia.  Damage to surrounding nerves, tissues, or structures.  Blood clot.  Bleeding.  Scarring. Specific risks for colostomy reversal, while rare, may include:  Intestinal paralysis (ileus). This is a normal part of recovery. It usually goes away in 3 to 7 days. However, it can last longer in some people.  Leaking at the joined part of the intestine (anastamotic leak).  Infection of the surgical cut (incision) or the place where the stoma was located.  A collection of pus (abscess) in the abdomen or pelvis.  Intestinal blockage.  Narrowing at the joined part of the intestine (stricture).  Urinary and sexual dysfunction. BEFORE THE PROCEDURE It is important to follow your surgeon's instructions prior to your procedure. This will help you to avoid complications. Steps before your procedure may include:  A physical exam, rectal exam, X-rays, colonoscopy, and other procedures.  Chemotherapy or radiation therapy if the stoma was created for cancer.  A review of the procedure, the anesthesia being used, and what to expect after the  procedure. You may be asked to:  Stop taking certain medicines for several days prior to your procedure. This may include blood thinners (such as aspirin).  Take certain medicines, such as antibiotics or stool softeners.  Avoid eating and drinking after midnight the night before the procedure. This will help you to avoid complications from the anesthesia.  Quit smoking. Smoking increases the chances of a healing problem after your procedure. PROCEDURE  You will be given medicine that makes you sleep (general anesthetic). The procedure may be done as open surgery, with a large incision. It may also be done as laparoscopic surgery, with several smaller incisions. The surgeon will stitch or staple the intestine ends back together. This surgery takes several hours. AFTER THE PROCEDURE  You will be given pain medicine.  Your caregivers will slowly increase your diet and movement.  You can expect to be in the hospital for about 5 to 10 days.  You should arrange for someone to help you with activities at home while you recover. Document Released: 12/13/2011 Document Reviewed: 12/13/2011 [facility] Patient Information 2014 [place], [place].  Patient's surgery has been scheduled for 09-14-13 at [facility].

## 2013-09-12 ENCOUNTER — Encounter: Payer: Self-pay | Admitting: General Surgery

## 2013-09-12 ENCOUNTER — Ambulatory Visit (INDEPENDENT_AMBULATORY_CARE_PROVIDER_SITE_OTHER): Payer: BC Managed Care – PPO | Admitting: General Surgery

## 2013-09-12 VITALS — BP 140/80 | HR 76 | Resp 14 | Ht 71.0 in | Wt 207.0 lb

## 2013-09-12 DIAGNOSIS — K5732 Diverticulitis of large intestine without perforation or abscess without bleeding: Secondary | ICD-10-CM

## 2013-09-12 DIAGNOSIS — L02219 Cutaneous abscess of trunk, unspecified: Secondary | ICD-10-CM

## 2013-09-12 MED ORDER — SULFAMETHOXAZOLE-TRIMETHOPRIM 400-80 MG PO TABS: 1.0000 | ORAL_TABLET | Freq: Two times a day (BID) | ORAL | Status: AC

## 2013-09-12 NOTE — Patient Instructions (Addendum)
Rx sent  Patient's surgery has been rescheduled for 09-19-13 at [facility]. This patient is to have labs drawn today at [facility] lab.

## 2013-09-12 NOTE — Progress Notes (Signed)
Patient ID: Rodney Vega, male   DOB: 05/12/1956, 57 y.o.   MRN: [id number]  Chief Complaint  Patient presents with  . Other    ingrown hair     HPI Rodney Vega is a 57 y.o. male. here today for a evaluation of a ingrown hair around pubic area.HPI  Past Medical History  Diagnosis Date  . Diverticulitis   . Glaucoma     since 2004  . Hernia 1979  . Personal history of tobacco use, presenting hazards to health   . Kidney stone 2013  . Special screening for malignant neoplasms, colon   . Screening for obesity     Past Surgical History  Procedure Laterality Date  . Back surgery  1994, 2012  . Hernia repair  1979  . Tonsillectomy and adenoidectomy  1962  . Lithotripsy  2013  . Colonoscopy  4098,1191    [facility]/Dr. Carmell Austria sessile polyp,77mm, found in rectum,multiple diverticula were found in the sigmoid, in descending and in transverse colon  . Laparotomy  04-20-13    colon resection with colosotmy  . Colostomy  04-20-13    History reviewed. No pertinent family history.  Social History History  Substance Use Topics  . Smoking status: Current Every Day Smoker -- 1.00 packs/day for 20 years  . Smokeless tobacco: Never Used  . Alcohol Use: Yes    Allergies  Allergen Reactions  . Aleve [Naproxen Sodium] Itching    Current Outpatient Prescriptions  Medication Sig Dispense Refill  . acetaminophen (TYLENOL) 325 MG tablet Take 325 mg by mouth every 6 (six) hours as needed.       . metroNIDAZOLE (FLAGYL) 500 MG tablet Take one (1) tablet at 6 pm and one (1) tablet at 11 pm the evening prior to surgery.  2 tablet  0  . neomycin (MYCIFRADIN) 500 MG tablet Take two (2) tablets at 6 pm and two (2) tablets at 11 pm the evening prior to surgery.  4 tablet  0  . polyethylene glycol powder (GLYCOLAX/MIRALAX) powder 255 grams one bottle for bowel prep for surgery  255 g  0  . sulfamethoxazole-trimethoprim (BACTRIM) 400-80 MG per tablet Take 1 tablet by mouth 2 (two) times daily.  14 tablet   0   No current facility-administered medications for this visit.    Review of Systems Review of Systems  Constitutional: Negative.   Respiratory: Negative.   Cardiovascular: Negative.     Blood pressure 140/80, pulse 76, resp. rate 14, height 5\' 11"  (1.803 m), weight 207 lb (93.895 kg).  Physical Exam Physical Exam  Constitutional: He is oriented to person, place, and time. He appears well-developed and well-nourished.  Eyes: No scleral icterus.  Lymphadenopathy:    He has no cervical adenopathy.  Neurological: He is alert and oriented to person, place, and time.  Skin: Skin is warm and dry.  On top of pubic area there is a 3-4cm red swollen area with fluctuance in middle.   Data Reviewed none  Assessment    Skin abscess pubic area. Feel this needs to be treated and controlled before planned surgery for colostomy closure. This was drained today, c/s sent and pt started on Bactrm DS 1 bid for 1 week.     Incision and drainage: Abscess pubic area. 1ml 1% xylocine used, betadine prep. Cruciate incision made over the abscess and 1-2 ml thick yellow pus evacuated. C/S sent. Dressed with 4/4s.  Plan        Patient's surgery has been rescheduled for  09-19-13 at [facility]. This patient is to have the following labs drawn today at [facility] lab: CBC and Met C.   SANKAR,SEEPLAPUTHUR G 09/12/2013, 10:10 AM

## 2013-09-13 LAB — CBC WITH DIFFERENTIAL
Basos: 1 %
Eos: 4 %
HCT: 42.5 % (ref 37.5–51.0)
Hemoglobin: 14.5 g/dL (ref 12.6–17.7)
Immature Grans (Abs): 0 10*3/uL (ref 0.0–0.1)
Lymphs: 29 %
MCHC: 34.1 g/dL (ref 31.5–35.7)
MCV: 90 fL (ref 79–97)
Monocytes: 11 %
Neutrophils Absolute: 4.2 10*3/uL (ref 1.4–7.0)
Neutrophils Relative %: 55 %
RBC: 4.71 x10E6/uL (ref 4.14–5.80)
WBC: 7.5 10*3/uL (ref 3.4–10.8)

## 2013-09-13 LAB — COMPREHENSIVE METABOLIC PANEL
ALT: 16 IU/L (ref 0–44)
Albumin/Globulin Ratio: 2.1 (ref 1.1–2.5)
Albumin: 4.4 g/dL (ref 3.5–5.5)
CO2: 25 mmol/L (ref 18–29)
Calcium: 9.8 mg/dL (ref 8.7–10.2)
GFR calc non Af Amer: 73 mL/min/{1.73_m2} (ref >59)
Glucose: 105 mg/dL — ABNORMAL HIGH (ref 65–99)
Potassium: 4.8 mmol/L (ref 3.5–5.2)
Total Protein: 6.5 g/dL (ref 6.0–8.5)

## 2013-09-17 LAB — ANAEROBIC AND AEROBIC CULTURE

## 2013-09-18 ENCOUNTER — Ambulatory Visit: Payer: Self-pay | Admitting: General Surgery

## 2013-09-19 ENCOUNTER — Inpatient Hospital Stay: Payer: Self-pay | Admitting: General Surgery

## 2013-09-19 ENCOUNTER — Encounter: Payer: Self-pay | Admitting: General Surgery

## 2013-09-19 DIAGNOSIS — Z933 Colostomy status: Secondary | ICD-10-CM

## 2013-09-20 ENCOUNTER — Encounter: Payer: Self-pay | Admitting: General Surgery

## 2013-09-20 LAB — CBC WITH DIFFERENTIAL/PLATELET
Basophil #: 0.1 10*3/uL (ref 0.0–0.1)
Eosinophil #: 0 10*3/uL (ref 0.0–0.7)
HGB: 13.4 g/dL (ref 13.0–18.0)
MCH: 31.5 pg (ref 26.0–34.0)
MCHC: 35.1 g/dL (ref 32.0–36.0)
Monocyte #: 1.1 x10 3/mm — ABNORMAL HIGH (ref 0.2–1.0)
Monocyte %: 6.1 %
Neutrophil #: 15.3 10*3/uL — ABNORMAL HIGH (ref 1.4–6.5)
Platelet: 216 10*3/uL (ref 150–440)
RBC: 4.25 10*6/uL — ABNORMAL LOW (ref 4.40–5.90)
RDW: 14 % (ref 11.5–14.5)
WBC: 17.9 10*3/uL — ABNORMAL HIGH (ref 3.8–10.6)

## 2013-09-20 LAB — BASIC METABOLIC PANEL
Anion Gap: 5 — ABNORMAL LOW (ref 7–16)
Calcium, Total: 8.2 mg/dL — ABNORMAL LOW (ref 8.5–10.1)
Chloride: 107 mmol/L (ref 98–107)
Glucose: 160 mg/dL — ABNORMAL HIGH (ref 65–99)
Osmolality: 279 (ref 275–301)
Sodium: 138 mmol/L (ref 136–145)

## 2013-09-21 LAB — CBC WITH DIFFERENTIAL/PLATELET
Basophil #: 0.1 10*3/uL (ref 0.0–0.1)
Basophil %: 0.5 %
Eosinophil %: 0.3 %
HCT: 36.3 % — ABNORMAL LOW (ref 40.0–52.0)
Lymphocyte #: 1.4 10*3/uL (ref 1.0–3.6)
MCH: 31.3 pg (ref 26.0–34.0)
MCV: 91 fL (ref 80–100)
Monocyte #: 0.9 x10 3/mm (ref 0.2–1.0)
Neutrophil #: 11.3 10*3/uL — ABNORMAL HIGH (ref 1.4–6.5)
Neutrophil %: 82.2 %
Platelet: 185 10*3/uL (ref 150–440)
WBC: 13.8 10*3/uL — ABNORMAL HIGH (ref 3.8–10.6)

## 2013-09-24 ENCOUNTER — Encounter: Payer: Self-pay | Admitting: General Surgery

## 2013-09-24 LAB — CREATININE, SERUM
Creatinine: 0.84 mg/dL (ref 0.60–1.30)
EGFR (African American): >60
EGFR (Non-African Amer.): >60

## 2013-09-25 ENCOUNTER — Telehealth: Payer: Self-pay | Admitting: *Deleted

## 2013-09-25 NOTE — Telephone Encounter (Signed)
Restoril 15 mg 1 po qhs prn sleep #30 called in to [facility] per Dr Evette Cristal.

## 2013-09-25 NOTE — Telephone Encounter (Signed)
FMLA completed. Pt request Restoril for sleep?

## 2013-10-02 ENCOUNTER — Encounter: Payer: Self-pay | Admitting: General Surgery

## 2013-10-02 ENCOUNTER — Ambulatory Visit (INDEPENDENT_AMBULATORY_CARE_PROVIDER_SITE_OTHER): Payer: BC Managed Care – PPO | Admitting: General Surgery

## 2013-10-02 VITALS — BP 134/72 | HR 74 | Resp 14 | Ht 71.0 in | Wt 198.0 lb

## 2013-10-02 DIAGNOSIS — K5732 Diverticulitis of large intestine without perforation or abscess without bleeding: Secondary | ICD-10-CM

## 2013-10-02 NOTE — Progress Notes (Signed)
This is a 57 year old male here today for his post op visit. He had laparpotomy on12/17/14. Rectum was unidentifiable and colostomy reversal was not feasible. Pt is adjusting to this finding. Colostomy is working. Abd is soft. Incision is clean. Colostomy intact. Staples removed.

## 2013-10-02 NOTE — Patient Instructions (Signed)
No exertional activity. 

## 2013-10-03 ENCOUNTER — Encounter: Payer: Self-pay | Admitting: General Surgery

## 2013-10-15 ENCOUNTER — Encounter: Payer: Self-pay | Admitting: General Surgery

## 2013-10-15 ENCOUNTER — Ambulatory Visit (INDEPENDENT_AMBULATORY_CARE_PROVIDER_SITE_OTHER): Payer: BC Managed Care – PPO | Admitting: General Surgery

## 2013-10-15 VITALS — BP 134/70 | HR 74 | Resp 12 | Ht 71.0 in | Wt 193.0 lb

## 2013-10-15 DIAGNOSIS — Z933 Colostomy status: Secondary | ICD-10-CM

## 2013-10-15 NOTE — Progress Notes (Signed)
Patient ID: Rodney Vega, male   DOB: 06-22-1956, 58 y.o.   MRN: [id number]  Chief Complaint  Patient presents with  . Follow-up    HPI Rodney Vega is a 58 y.o. male. Here for follow up from failed attempt at colostomy reversal. He reports is doing well physically. He does report some depression and loss of appetite. HPI  Past Medical History  Diagnosis Date  . Diverticulitis   . Glaucoma     since 2004  . Hernia 1979  . Personal history of tobacco use, presenting hazards to health   . Kidney stone 2013  . Special screening for malignant neoplasms, colon   . Screening for obesity     Past Surgical History  Procedure Laterality Date  . Back surgery  1994, 2012  . Hernia repair  1979  . Tonsillectomy and adenoidectomy  1962  . Lithotripsy  2013  . Colonoscopy  [id number]    [facility]/Dr. Buelah Manis sessile polyp,17mm, found in rectum,multiple diverticula were found in the sigmoid, in descending and in transverse colon  . Laparotomy  04-20-13    colon resection with colosotmy  . Colostomy  04-20-13    History reviewed. No pertinent family history.  Social History History  Substance Use Topics  . Smoking status: Current Every Day Smoker -- 1.00 packs/day for 20 years  . Smokeless tobacco: Never Used  . Alcohol Use: Yes    Allergies  Allergen Reactions  . Aleve [Naproxen Sodium] Itching    Current Outpatient Prescriptions  Medication Sig Dispense Refill  . acetaminophen (TYLENOL) 325 MG tablet Take 325 mg by mouth every 6 (six) hours as needed.       . Cholecalciferol (VITAMIN D PO) Take by mouth.      Marland Kitchen FLUoxetine (PROZAC) 10 MG capsule Take 10 mg by mouth daily.      . Multiple Vitamin (MULTIVITAMIN) tablet Take 1 tablet by mouth daily.       No current facility-administered medications for this visit.    Review of Systems Review of Systems  Constitutional: Negative.   Respiratory: Negative.   Cardiovascular: Negative.     Blood pressure 134/70, pulse 74, resp.  rate 12, height 5\' 11"  (1.803 m), weight 193 lb (87.544 kg).  Physical Exam Physical Exam  Constitutional: He is oriented to person, place, and time. He appears well-developed and well-nourished.  Neurological: He is alert and oriented to person, place, and time.  Skin: Skin is warm.  Abdomen is soft. Incision healed well. Colostomy intact and functioning well.  Data Reviewed none  Assessment    Pt overall doing well.     Plan    Patient to return in 2-3 weeks. May increase activity to light work.        SANKAR,SEEPLAPUTHUR G 10/17/2013, 7:43 AM

## 2013-10-15 NOTE — Patient Instructions (Signed)
Patient to return in two weeks

## 2013-10-17 ENCOUNTER — Encounter: Payer: Self-pay | Admitting: General Surgery

## 2013-10-30 ENCOUNTER — Encounter: Payer: Self-pay | Admitting: General Surgery

## 2013-10-30 ENCOUNTER — Ambulatory Visit (INDEPENDENT_AMBULATORY_CARE_PROVIDER_SITE_OTHER): Payer: BC Managed Care – PPO | Admitting: General Surgery

## 2013-10-30 VITALS — BP 132/74 | HR 76 | Resp 14 | Ht 71.0 in | Wt 196.0 lb

## 2013-10-30 DIAGNOSIS — Z933 Colostomy status: Secondary | ICD-10-CM

## 2013-10-30 NOTE — Progress Notes (Signed)
This is a 58 year old male here for follow up from failed attempt at colostomy reversal. He reports is doing well physically. He does report some depression and loss of appetite. Incision is well healed and strong. The colostomy is intact and functing well. Patient may return to work 11/05/13.

## 2013-10-30 NOTE — Patient Instructions (Addendum)
Patient to return to work on 11/05/13. Will have pt evaluated at tertiary care facility for input regarding colostomy closure.

## 2013-12-04 ENCOUNTER — Ambulatory Visit (INDEPENDENT_AMBULATORY_CARE_PROVIDER_SITE_OTHER): Payer: BC Managed Care – PPO | Admitting: General Surgery

## 2013-12-04 ENCOUNTER — Encounter: Payer: Self-pay | Admitting: General Surgery

## 2013-12-04 VITALS — BP 146/98 | HR 88 | Resp 12 | Ht 71.0 in | Wt 202.0 lb

## 2013-12-04 DIAGNOSIS — Z933 Colostomy status: Secondary | ICD-10-CM

## 2013-12-04 NOTE — Patient Instructions (Addendum)
Patient to return in three months. Advised use of zinc oxide for skin irritation near colostomy

## 2013-12-04 NOTE — Progress Notes (Signed)
Patient ID: Rodney Vega, male   DOB: 1956-09-10, 58 y.o.   MRN: [id number]  Chief Complaint  Patient presents with  . Follow-up    colostomy    HPI Rodney Vega is a 58 y.o. male.  Here today for follow up colostomy. Irritation around the stoma and bleeds occasionally around the stoma.  HPI  Past Medical History  Diagnosis Date  . Diverticulitis   . Glaucoma     since 2004  . Hernia 1979  . Personal history of tobacco use, presenting hazards to health   . Kidney stone 2013  . Special screening for malignant neoplasms, colon   . Screening for obesity     Past Surgical History  Procedure Laterality Date  . Back surgery  1994, 2012  . Hernia repair  1979  . Tonsillectomy and adenoidectomy  1962  . Lithotripsy  2013  . Colonoscopy  [id number]    [facility]/Dr. Buelah Manis sessile polyp,42mm, found in rectum,multiple diverticula were found in the sigmoid, in descending and in transverse colon  . Laparotomy  04-20-13    colon resection with colosotmy  . Colostomy  04-20-13    History reviewed. No pertinent family history.  Social History History  Substance Use Topics  . Smoking status: Current Every Day Smoker -- 1.00 packs/day for 20 years  . Smokeless tobacco: Never Used  . Alcohol Use: Yes    Allergies  Allergen Reactions  . Aleve [Naproxen Sodium] Itching    Current Outpatient Prescriptions  Medication Sig Dispense Refill  . diphenhydramine-acetaminophen (TYLENOL PM) 25-500 MG TABS Take 1 tablet by mouth at bedtime as needed.      . Multiple Vitamin (MULTIVITAMIN) tablet Take 1 tablet by mouth daily.       No current facility-administered medications for this visit.    Review of Systems Review of Systems  Constitutional: Negative.   Respiratory: Negative.   Cardiovascular: Negative.   Gastrointestinal: Negative for nausea, vomiting, diarrhea and constipation.    Blood pressure 146/98, pulse 88, resp. rate 12, height 5\' 11"  (1.803 m), weight 202 lb (91.627  kg).  Physical Exam Physical Exam  Constitutional: He is oriented to person, place, and time. He appears well-developed and well-nourished.  Eyes: Conjunctivae are normal.  Neck: Neck supple. No mass and no thyromegaly present.  Cardiovascular: Normal rate, regular rhythm, normal heart sounds and intact distal pulses.   Pulses:      Dorsalis pedis pulses are 2+ on the right side, and 2+ on the left side.       Posterior tibial pulses are 2+ on the right side, and 2+ on the left side.  No edema   Pulmonary/Chest: Breath sounds normal.  Abdominal:  Mild skin irritation around the colostomy.  Neurological: He is alert and oriented to person, place, and time.  Skin: Skin is warm and dry.    Data Reviewed none  Assessment   Pt is doing well post sigmoid resection and colostomy ,failed attempt at reversal due to recal stump being unidentifiable.    Plan    Patient to return in 3 months.        Taylar Hartsough G 12/06/2013, 5:55 AM

## 2013-12-06 ENCOUNTER — Encounter: Payer: Self-pay | Admitting: General Surgery

## 2013-12-11 ENCOUNTER — Ambulatory Visit (INDEPENDENT_AMBULATORY_CARE_PROVIDER_SITE_OTHER): Payer: BC Managed Care – PPO | Admitting: General Surgery

## 2013-12-11 ENCOUNTER — Ambulatory Visit: Payer: BC Managed Care – PPO | Admitting: General Surgery

## 2013-12-11 ENCOUNTER — Telehealth: Payer: Self-pay | Admitting: *Deleted

## 2013-12-11 ENCOUNTER — Encounter: Payer: Self-pay | Admitting: General Surgery

## 2013-12-11 VITALS — BP 134/72 | HR 74 | Resp 14 | Ht 71.0 in | Wt 206.0 lb

## 2013-12-11 DIAGNOSIS — L02419 Cutaneous abscess of limb, unspecified: Secondary | ICD-10-CM

## 2013-12-11 DIAGNOSIS — L03119 Cellulitis of unspecified part of limb: Secondary | ICD-10-CM

## 2013-12-11 DIAGNOSIS — L02416 Cutaneous abscess of left lower limb: Secondary | ICD-10-CM

## 2013-12-11 MED ORDER — DOXYCYCLINE HYCLATE 100 MG PO CAPS: 100.0000 mg | ORAL_CAPSULE | Freq: Two times a day (BID) | ORAL | Status: DC

## 2013-12-11 NOTE — Telephone Encounter (Signed)
Called states there is an area marble size on left leg that is red and painful.

## 2013-12-11 NOTE — Patient Instructions (Addendum)
Patient to call if swelling and pain get worse. Otherwise follow up as schedule

## 2013-12-11 NOTE — Progress Notes (Signed)
Patient ID: Rodney Vega, male   DOB: July 31, 1956, 57 y.o.   MRN: [id number]  Chief Complaint  Patient presents with  . Follow-up    left thigh infected area    HPI Rodney Vega is a 58 y.o. male here today for an evaluation for his left leg abscess. Patient noticed this about four days. He states it is draining.  HPI  Past Medical History  Diagnosis Date  . Diverticulitis   . Glaucoma     since 2004  . Hernia 1979  . Personal history of tobacco use, presenting hazards to health   . Kidney stone 2013  . Special screening for malignant neoplasms, colon   . Screening for obesity     Past Surgical History  Procedure Laterality Date  . Back surgery  1994, 2012  . Hernia repair  1979  . Tonsillectomy and adenoidectomy  1962  . Lithotripsy  2013  . Colonoscopy  [id number]    [facility]/Dr. Buelah Manis sessile polyp,15mm, found in rectum,multiple diverticula were found in the sigmoid, in descending and in transverse colon  . Laparotomy  04-20-13    colon resection with colosotmy  . Colostomy  04-20-13    History reviewed. No pertinent family history.  Social History History  Substance Use Topics  . Smoking status: Current Every Day Smoker -- 1.00 packs/day for 20 years  . Smokeless tobacco: Never Used  . Alcohol Use: Yes    Allergies  Allergen Reactions  . Aleve [Naproxen Sodium] Itching    Current Outpatient Prescriptions  Medication Sig Dispense Refill  . diphenhydramine-acetaminophen (TYLENOL PM) 25-500 MG TABS Take 1 tablet by mouth at bedtime as needed.      . Multiple Vitamin (MULTIVITAMIN) tablet Take 1 tablet by mouth daily.      Marland Kitchen doxycycline (VIBRAMYCIN) 100 MG capsule Take 1 capsule (100 mg total) by mouth 2 (two) times daily.  14 capsule  0   No current facility-administered medications for this visit.    Review of Systems Review of Systems  Constitutional: Negative.   Respiratory: Negative.   Cardiovascular: Negative.        Blood pressure 134/72, pulse 74,  resp. rate 14, height 5\' 11"  (1.803 m), weight 206 lb (93.441 kg).  Physical Exam Physical Exam 3cm redness induration inner left thigh, 2 mm scabbed opening in the middle. No fluctuance noted. With gentle pressure 2 drops of pus drained.  No evidence of cellulitis.  Data Reviewed    Assessment    Likely an infected insect bite.      Plan    Moist heat , antibiotic-Doxycycline 100mg  bid for 1 week.        Rolande Moe G 12/12/2013, 6:06 AM

## 2013-12-12 ENCOUNTER — Encounter: Payer: Self-pay | Admitting: General Surgery

## 2014-01-31 ENCOUNTER — Telehealth: Payer: Self-pay | Admitting: *Deleted

## 2014-01-31 NOTE — Telephone Encounter (Signed)
Phone call from significant other, Vickii Chafe, a 3 month follow up appt was made for June. She also states that he has had at least 3-4 small "rabbit" stools from rectum, and he actually has passed gas from the rectum as well. No blood has been noted and denies any unusual pain. His colostomy is still functioning with at least 2 stools a day. i told her I would inform Dr. Jamal Collin and that we would call back if his appt needed to be sooner.

## 2014-03-07 ENCOUNTER — Telehealth: Payer: Self-pay | Admitting: General Surgery

## 2014-03-07 NOTE — Telephone Encounter (Signed)
Rodney Vega WOULD LIKE TO SPEAK TO YOU ABOUT STEVE Wingler.SHE WOULD LIKE TO KNOW IF HE CAN BE GIVEN ANYTHING FOR DEPRESSION.

## 2014-03-07 NOTE — Telephone Encounter (Signed)
Patient to check with Dr. Kary Kos regarding medications. She states the ostomy itself is doing well.

## 2014-03-26 ENCOUNTER — Encounter: Payer: Self-pay | Admitting: General Surgery

## 2014-03-26 ENCOUNTER — Ambulatory Visit (INDEPENDENT_AMBULATORY_CARE_PROVIDER_SITE_OTHER): Payer: BC Managed Care – PPO | Admitting: General Surgery

## 2014-03-26 VITALS — BP 140/90 | HR 76 | Resp 16 | Ht 71.0 in | Wt 207.0 lb

## 2014-03-26 DIAGNOSIS — Z933 Colostomy status: Secondary | ICD-10-CM

## 2014-03-26 NOTE — Progress Notes (Signed)
Patient ID: Rodney Vega, male   DOB: 06-12-1956, 58 y.o.   MRN: [id number]  Chief Complaint  Patient presents with  . Follow-up    3 month follow up colostomy    HPI Rodney Vega is a 58 y.o. male.  Here today for follow up colostomy. Ostomy functioning well, no bleeding noted. No gastrointestinal issues. Small irritated skin area around the ostomy and uses antibiotic ointment and zinc oxide occasionally.  Using OTC cold medications.  HPI  Past Medical History  Diagnosis Date  . Diverticulitis   . Glaucoma     since 2004  . Hernia 1979  . Personal history of tobacco use, presenting hazards to health   . Kidney stone 2013  . Special screening for malignant neoplasms, colon   . Screening for obesity     Past Surgical History  Procedure Laterality Date  . Back surgery  1994, 2012  . Hernia repair  1979  . Tonsillectomy and adenoidectomy  1962  . Lithotripsy  2013  . Colonoscopy  9563,8756    [facility]/Dr. Buelah Vega sessile polyp,13mm, found in rectum,multiple diverticula were found in the sigmoid, in descending and in transverse colon  . Laparotomy  04-20-13    colon resection with colosotmy  . Colostomy  04-20-13    History reviewed. No pertinent family history.  Social History History  Substance Use Topics  . Smoking status: Current Every Day Smoker -- 1.00 packs/day for 20 years  . Smokeless tobacco: Never Used  . Alcohol Use: Yes    Allergies  Allergen Reactions  . Aleve [Naproxen Sodium] Itching    Current Outpatient Prescriptions  Medication Sig Dispense Refill  . Acetaminophen (TYLENOL ARTHRITIS PAIN PO) Take by mouth as needed.      . diphenhydramine-acetaminophen (TYLENOL PM) 25-500 MG TABS Take 1 tablet by mouth at bedtime as needed.      . dorzolamide-timolol (COSOPT) 22.3-6.8 MG/ML ophthalmic solution Frequency:BID   Dosage:0.0     Instructions:  Note:Dose: 2%-0.5%      . latanoprost (XALATAN) 0.005 % ophthalmic solution Administer 1 drop to both eyes  nightly.      . Multiple Vitamin (MULTIVITAMIN) tablet Take 1 tablet by mouth daily.      Marland Kitchen venlafaxine XR (EFFEXOR-XR) 37.5 MG 24 hr capsule 1 po qd x 1-2 wks,then 2 po qd if needed       No current facility-administered medications for this visit.    Review of Systems Review of Systems  Constitutional: Negative.   Respiratory: Negative.   Cardiovascular: Negative.     Blood pressure 140/90, pulse 76, resp. rate 16, height 5\' 11"  (1.803 m), weight 207 lb (93.895 kg).  Physical Exam Physical Exam  Constitutional: He is oriented to person, place, and time. He appears well-developed and well-nourished.  Neck: Neck supple.  Cardiovascular: Normal rate, regular rhythm and normal heart sounds.   Pulmonary/Chest: Effort normal and breath sounds normal.  Abdominal: Soft. Normal appearance and bowel sounds are normal.  Ostomy present left lower quadrant. Intact and functioning. No hernia noted.  Lymphadenopathy:    He has no cervical adenopathy.  Neurological: He is alert and oriented to person, place, and time.  Skin: Skin is warm and dry.    Data Reviewed none  Assessment    Stable physical exam. Patient is status post Hartman's for diverticular disease. Subsequent attempt for reversal was unsuccessful due to inability to identify rectal pouch.    Plan    Will arrange for appointment at tertiary care for  consideration of colostomy reversal.      He would like letter to avoid heavy lifting at work.  PCP: Rodney Vega 03/27/2014, 5:29 AM

## 2014-03-26 NOTE — Patient Instructions (Signed)
The patient is aware to call back for any questions or concerns.  

## 2014-03-27 ENCOUNTER — Encounter: Payer: Self-pay | Admitting: General Surgery

## 2014-04-15 ENCOUNTER — Ambulatory Visit (INDEPENDENT_AMBULATORY_CARE_PROVIDER_SITE_OTHER): Payer: BC Managed Care – PPO | Admitting: General Surgery

## 2014-04-15 VITALS — BP 130/72 | HR 76 | Resp 14 | Ht 71.0 in | Wt 207.0 lb

## 2014-04-15 DIAGNOSIS — Z933 Colostomy status: Secondary | ICD-10-CM

## 2014-04-15 NOTE — Progress Notes (Signed)
Patient ID: Rodney Vega, male   DOB: 01-23-1956, 58 y.o.   MRN: [id number]  Chief Complaint  Patient presents with  . Follow-up    colotomy    HPI Rodney Vega is a 58 y.o. male here today for a evaluation of a hole in colotomy site. Patient noticed this 04/13/14. His long-term girlfriend who is responsible for stoma management noticed an ulcer forming medial to the colostomy in the last few days. She was concerned about the possibility for infection and he was asked to come in for assessment.  They've had to make use of several different appliances, and in spite of this and had difficulty with a good seal.  HPI  Past Medical History  Diagnosis Date  . Diverticulitis   . Glaucoma     since 2004  . Hernia 1979  . Personal history of tobacco use, presenting hazards to health   . Kidney stone 2013  . Special screening for malignant neoplasms, colon   . Screening for obesity     Past Surgical History  Procedure Laterality Date  . Back surgery  1994, 2012  . Hernia repair  1979  . Tonsillectomy and adenoidectomy  1962  . Lithotripsy  2013  . Colonoscopy  [id number]    [facility]/Dr. Buelah Manis sessile polyp,23mm, found in rectum,multiple diverticula were found in the sigmoid, in descending and in transverse colon  . Laparotomy  04-20-13    colon resection with colosotmy  . Colostomy  04-20-13    No family history on file.  Social History History  Substance Use Topics  . Smoking status: Current Every Day Smoker -- 1.00 packs/day for 20 years  . Smokeless tobacco: Never Used  . Alcohol Use: Yes    Allergies  Allergen Reactions  . Aleve [Naproxen Sodium] Itching    Current Outpatient Prescriptions  Medication Sig Dispense Refill  . Acetaminophen (TYLENOL ARTHRITIS PAIN PO) Take by mouth as needed.      . diphenhydramine-acetaminophen (TYLENOL PM) 25-500 MG TABS Take 1 tablet by mouth at bedtime as needed.      . dorzolamide-timolol (COSOPT) 22.3-6.8 MG/ML ophthalmic solution  Frequency:BID   Dosage:0.0     Instructions:  Note:Dose: 2%-0.5%      . latanoprost (XALATAN) 0.005 % ophthalmic solution Administer 1 drop to both eyes nightly.      . Multiple Vitamin (MULTIVITAMIN) tablet Take 1 tablet by mouth daily.      Marland Kitchen venlafaxine XR (EFFEXOR-XR) 37.5 MG 24 hr capsule 1 po qd x 1-2 wks,then 2 po qd if needed       No current facility-administered medications for this visit.    Review of Systems Review of Systems  Constitutional: Negative.   Respiratory: Negative.   Cardiovascular: Negative.     Blood pressure 130/72, pulse 76, resp. rate 14, height 5\' 11"  (1.803 m), weight 207 lb (93.895 kg).  Physical Exam Physical Exam  Constitutional: He is oriented to person, place, and time. He appears well-nourished.  Abdominal:    Neurological: He is alert and oriented to person, place, and time.  Skin: Skin is dry.       Assessment    Leaking stoma with resulting skin ulceration.     Plan    Will make a referral to the hospital ostomy nurse for her assessment. In the interval, the patient's girlfriend will make use of stoma paste to minimize skin irritation.     PCP: Virgie Dad 04/16/2014, 9:24 PM

## 2014-04-17 ENCOUNTER — Telehealth: Payer: Self-pay

## 2014-04-17 NOTE — Telephone Encounter (Signed)
Message copied by Lesly Rubenstein on Wed Apr 17, 2014 11:50 AM ------      Message from: [place], [place]      Created: Mon Apr 15, 2014  4:33 PM       See if you can arrange an evaluation by the stoma nurse,  Domenic Moras; pager [phone]; office # [phone] re: appliance fitting and para-stomal ulceration. Thanks. ------

## 2014-04-17 NOTE — Telephone Encounter (Signed)
Message left for patient to call back

## 2014-04-17 NOTE — Telephone Encounter (Signed)
Spoke with patient's wife and informed her of the doctor's recommendations. She is very [employer] attorney and will make sure he schedules with Domenic Moras for an evaluation and instruction. All contact information has been given to the patient.

## 2014-06-12 ENCOUNTER — Telehealth: Payer: Self-pay | Admitting: *Deleted

## 2014-06-12 NOTE — Telephone Encounter (Signed)
She was asking if Dr Jamal Collin had set up an appointment for colostomy reversal?

## 2014-06-12 NOTE — Telephone Encounter (Signed)
Pts girlfriend called and wanted to talk to you about his colostomy site.

## 2014-09-04 ENCOUNTER — Telehealth: Payer: Self-pay | Admitting: *Deleted

## 2014-09-04 NOTE — Telephone Encounter (Signed)
He was just wondering if Dr Jamal Collin had made any arrangements for his referral for colostomy reversal.

## 2014-09-04 NOTE — Telephone Encounter (Signed)
Pt was calling and wanted to talk to you, he didn't really say what he wanted but just to talk to you.

## 2014-09-10 ENCOUNTER — Telehealth: Payer: Self-pay | Admitting: *Deleted

## 2014-09-10 NOTE — Telephone Encounter (Signed)
I talked with Pegge and she is aware that Dr Jamal Collin is still trying to contact a referring physician, that he has not forgotten. She is also aware that Dr Jamal Collin will be gone the end on December.

## 2014-09-10 NOTE — Telephone Encounter (Signed)
Pt is calling to ask you a question

## 2015-01-24 NOTE — Op Note (Signed)
PATIENT NAME:  Rodney Vega, Rodney Vega MR#:  [id number] DATE OF BIRTH:  1955/12/22  DATE OF PROCEDURE:  04/20/2013  PREOPERATIVE DIAGNOSES:  1. Bladder laceration.  2. Colovesical fistula.   POSTOPERATIVE DIAGNOSES:  1. Bladder laceration.  2. Colovesical fistula.   PROCEDURE: Repair of bladder laceration.   SURGEON: Maryan Puls, M.D.   ANESTHETIST: Dennard Nip, M.D.   ANESTHETIC METHOD: General.   INDICATIONS: Mr. Poffenberger had just undergone sigmoid colon resection, and the segment of sigmoid was adherent to the dome of the bladder. During excision, the bladder was opened, and urologic consultation was requested. The patient does have a history of having a pinpoint colovesical fistula in the posterior bladder wall which was confirmed cystoscopically.   OPERATIVE SUMMARY: The patient had already undergone laparotomy. Bookwalter retractor was placed. The bladder dome was opened horizontally for a length of approximately 5 cm. The cystotomy did not extend to the ureters or trigone. Both ureteral orifices were identified and had clear efflux. At this point, the bladder mucosa was closed in several segments consisting of 4-0 Vicryl. After the mucosa was approximated, the bladder muscle and peritoneum was closed with running locking 2-0 Vicryl suture. At this point, the urologic portion of the procedure was terminated. The remaining portion of the procedure will be dictated by Dr. Jamal Collin.   ____________________________ [name]. [name], MD mrw:gb D: 04/20/2013 13:21:34 ET T: 04/20/2013 13:29:43 ET JOB#: [id number]  cc: [name]. [name], MD, <Dictator> [name] MD ELECTRONICALLY SIGNED 04/23/2013 8:08

## 2015-01-24 NOTE — Discharge Summary (Signed)
PATIENT NAME:  Rodney Vega, Rodney Vega MR#:  [id number] DATE OF BIRTH:  15-Jun-1956  DATE OF ADMISSION:  04/20/2013 DATE OF DISCHARGE:  04/24/2013  HISTORY OF PRESENT ILLNESS: This is a 59 year old male, who, in the past year, had recurring bouts of diverticulitis involving the sigmoid colon. He had been treated initially with in-hospital IV antibiotics and responded well and has been treated with oral antibiotics off and on over the past year per recurring episodes. The patient, most recently, presented with persistent left lower quadrant pain of a low-grade nature and CT scan showed, what looked like, active diverticulitis involving the sigmoid area with a mass-like effect. He has had a prior colonoscopy after his initial episode of diverticulitis showing diverticulosis, but no polyps or cancers. Prior to his admission, the patient also developed symptoms with some air and fecal material seen in the urine consistent with a colovesicle fistula. He was evaluated preoperatively also by Dr. Maryan Puls from urology. Recommendation was made for elective resection of this area and closure of the bladder defect and the patient accordingly was prepared for the same.  COURSE IN HOSPITAL: On 04/20/2013, he underwent laparotomy with lysis of adhesions, which involved several loops of small bowel stuck to this sigmoid mass area followed by sigmoid resection in the course of which a transverse tear of the bladder wall and the dome was noted since there was no actual plane of dissection. Colostomy with closure of the distal segment was performed and the bladder was repaired by Dr. Maryan Puls. The procedure was somewhat tedious, but otherwise uncomplicated. Postoperative course was basically unremarkable. The patient remained stable and after a brief period, appeared to be ileus. The colostomy was noted to be starting to function. He was able to tolerate oral intake without any difficulty. The abdominal incision was healing  satisfactorily. Instructions were given to the patient and arrangements were made for home health nursing to follow and assist with colostomy care.   FINAL DIAGNOSIS: Sigmoid diverticulitis with small bowel adhesions and colovesicle fistula.   OPERATION PERFORMED: Sigmoid colon resection with colostomy, closure of distal segment, lysis of adhesions with repair of the urinary bladder.   ____________________________ S.Robinette Haines, MD sgs:aw D: 05/08/2013 10:03:21 ET T: 05/08/2013 10:25:38 ET JOB#: [id number]  cc: Synthia Innocent. Jamal Collin, MD, <Dictator> [facility] Robinette Haines MD ELECTRONICALLY SIGNED 05/10/2013 7:57

## 2015-01-24 NOTE — Op Note (Signed)
PATIENT NAME:  Rodney Vega, Rodney Vega MR#:  [id number] DATE OF BIRTH:  03/01/1956  DATE OF PROCEDURE:  09/19/2013  PREOPERATIVE DIAGNOSIS: Status post sigmoid colostomy and resection of sigmoid colon for colovesical fistula.   POSTOPERATIVE DIAGNOSIS: Status post sigmoid colostomy and resection of sigmoid colon for colovesical fistula.   OPERATION PERFORMED: Laparotomy and attempted closure of colostomy, lysis of adhesions.   SURGEON: S.G. Jamal Collin, M.D.   ANESTHESIA: General.   COMPLICATIONS: None.   ESTIMATED BLOOD LOSS: Approximately 100 mL.   DRAINS: None.   DESCRIPTION OF PROCEDURE: This patient was put to sleep in a supine position on the operating table. He was placed on a bean bag and the legs placed in stirrups thereafter. A Foley catheter was inserted. The abdomen was prepped and draped out after of the colostomy site was closed with a pursestring sutureof 0silk. The previous skin incision along the midline was reopened after a timeout was performed. This was from below the umbilicus down towards the pubis, and it was carefully deepened through the layers until the peritoneal cavity was entered into. Some of the previous Prolene stitches in the fascia were removed. The peritoneal opening as it extended up and down for the entire length, it was noted that there were adhesions of small bowel to the anterior abdominal wall around the colostomy and in the pelvic area. Carefully, these adhesions were taken down until they were mobilized out of the pelvis and out of the anterior abdominal wall and around the colostomy. This was retracted superiorly and held in place with a lap sponge and retractors. A self-retaining retractor was placed. Although the previous Prolene stitch could be identified in the rectal area, this was buried somewhat and it was difficult to find. It also was noted that the rectum and the bladder were one contiguous structure with no defined edges or plane for any dissection. At this  point, it was decided to try to dilate the rectum with sigmoidoscope air and see if this will help to delineate the end of the rectum; however, although the area would distend, it would not stay within the rectum and kept pulling right out of the rectal area and this was unsuccessful. Following this, a finger palpation through the rectum was performed, and although the rectal stump area could be palpated from inside and from the rectal area, there appeared to be some tissue that was bridging this. It was really hard to tell if it was just rectal wall or whether it included some of the perirectal tissue. To make an honest attempt at trying to put this back together, it was felt that perhaps a stapler passed through the rectum might work. The 29 stapling device was brought up after sizing with dilators. The colostomy was taken down without difficulty, and the colostomy portion attached to the skin was excised out. With a pursestring suture around the sigmoid, it was brought up towards the rectum. It required to be mobilized some from the lateral aspect, and this was done to allow for easy approximation; however, the stapling device would not pass the anvil through to complete visualization, and an approximation was, therefore, felt to be unsuitable. It was felt that this was likely because of excessive tissue surrounding this area and since the rectum could not be more easily dissected out, further attempts were not made. The small opening through which the anvil came through was closed with a silk stitch and reinforced by the peritoneum over this. The colostomy was repositioned within the  prior colostomy site and matured. The fascial opening was then closed with interrupted 0 Prolene stitches. Subcutaneous tissue was irrigated and the skin closed with staples. Foley catheter was left in place. Colostomy appliance was placed. The patient subsequently returned to the recovery room in stable condition.    ____________________________ S.Robinette Haines, MD sgs:gb D: 09/19/2013 18:00:11 ET T: 09/19/2013 23:54:22 ET JOB#: [id number]  cc: S.G. Jamal Collin, MD, <Dictator> [facility] Robinette Haines MD ELECTRONICALLY SIGNED 09/20/2013 6:39

## 2015-01-24 NOTE — Op Note (Signed)
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   POSTOPERATIVE DIAGNOSIS: Phlegmonous sigmoid diverticulitis with a colovesical fistula and small bowel adhesions.   OPERATION PERFORMED: Laparotomy, lysis of adhesions, resection of sigmoid colon and repair of urinary bladder.   SURGEONS:  Mckinley Jewel, MD and Maryan Puls, MD from Urology   ASSISTANT:  Nestor Lewandowsky, MD    ANESTHESIA: General.  COMPLICATIONS: None.   ESTIMATED BLOOD LOSS: Approximately 900 mL.  REPLACEMENT: With 4 liters of crystalloid.   PROCEDURE: The patient was put to sleep in the supine position on the operating table, and a Foley catheter was inserted. Initial plan was for a laparoscopic attempt; however, on the patient being fully asleep and relaxed, abdominal exam revealed a palpable large mass in the left lower quadrant and suprapubic area.  It was felt, given this and the somewhat disturbing findings on a CT, that a laparoscopic approach would not be feasible. Accordingly, open laparotomy was planned. The abdomen was prepped and draped out as a sterile field. A timeout procedure was performed.  A vertical midline incision was made from below the umbilicus down to the pubic symphysis and carefully deepened through the layers of the abdominal wall.  In the lower part of the abdomen along the preperitoneal space and peritoneum, there appeared to be adherence of this mass, and, therefore, dissection was then performed to free up this area more carefully. It was noted during the course of this that the bladder wall was firmly adherent from the dome to the sigmoid mass and was basically with no plane of dissection. A transverse-oriented laceration through the bladder wall and the dome was noted and the Foley catheter exposed. At this point, the bladder wall was dissected  off the sigmoid colon both using blunt and sharp dissection very carefully until this completely freed from the sigmoid mass. After this was done, the mass was inspected, and with good exposure it was noted that there were multiple loops of small bowel also somewhat densely adherent to this mass. Again, careful dissection was then performed to free up the small bowel and loops, again using blunt and sharp dissection very slowly until this was completely freed. One small serosal tear was encountered in the small bowel which was repaired with 3-0 silk. The small bowel was then displaced superiorly and packed away, and the sigmoid colon was then dissected. It was noted that the proximal third of the sigmoid colon was relatively normal. However, the distal end and the upper rectum could not be easily visualized at this point. It was, therefore, decided to transect the bowel just proximal to this mass where the sigmoid colon was relatively normal. The mesentery was freed, and this was then taken down with a GIA stapler. The mesentery was then taken down with the use of the Harmonic device in addition to sutures with the use of 3-0 silk and 2-0 silk ligatures until the posterior aspect of the upper rectum was reached, and from here the rectum was adequately palpated. This was then freed around the posterior aspect, and a Contour stapling device was then used to take this down. The involved sigmoid colon was then sent in formalin to pathology. The staple line on the rectal stump was noted to be intact, and this was tagged with 2 sutures of 2-0 Prolene for future identification for reversal. At this point,  the procedure was turned over to Dr. Maryan Puls, who performed the repair of the bladder laceration. After this was completed, the area was irrigated.  The proximal sigmoid colon was then mobilized to allow for placement of the colostomy in the left lower quadrant. A circular incision was made along the anterior aspect  of the left rectus muscle, and the skin and subcutaneous tissue was removed. A cruciate incision was made in the anterior sheath of the rectus.  The rectus muscle was then separated.  The peritoneum was then opened and the opening dilated to 2 fingerbreadths.  The sigmoid colon stump was then brought up through this area and tacked to the fascia with interrupted 2-0 Vicryl stitches. Following this, the abdomen was irrigated and closed. The peritoneal layer was closed with a running 2-0 Vicryl stitch. The wound was irrigated and the fascia closed with interrupted figure-of-eight stitches of 0 Prolene. Subcutaneous tissue was irrigated and closed with 3-0 Vicryl and the skin approximated with staples. Following this, the colostomy was opened by removing the staple line and was matured with inverting sutures of 3-0 Vicryl.  A sealed dressing was placed over the incision and the colostomy appliance placed over this. The patient subsequently was extubated and returned to the recovery room in stable condition.  ____________________________ S.Robinette Haines, MD sgs:cb D: 04/22/2013 10:19:01 ET T: 04/22/2013 21:20:31 ET JOB#: [id number] cc: S.G. Jamal Collin, MD, <Dictator> [facility] Robinette Haines MD ELECTRONICALLY SIGNED 04/23/2013 6:36

## 2015-01-24 NOTE — Consult Note (Signed)
Brief Consult Note: Diagnosis: Bladder laceration/colovesical fistula.   Recommend to proceed with surgery or procedure.   Orders entered.   Discussed with Attending MD.   Comments: Foley for 1-2 weeks. Follow-up in the office in one week for cystogram. Levsin as needed for bladder spasms.  Electronic Signatures: [name] (MD)  (Signed 18-Jul-14 13:26)  Authored: Brief Consult Note   Last Updated: 18-Jul-14 13:26 by [name] (MD)

## 2015-01-25 NOTE — Discharge Summary (Signed)
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 At that time, a Hartmann's procedure was performed, and the bladder repair was repaired by urology. The patient has since done very well, and he was now brought in for an elective reanastomosis of his colon. He was scheduled for the surgery a week prior, but he had developed about 2 weeks ago an area of infection, cutaneous over the pubic region, and it was felt that this was best cleared before elective surgery is performed. This abscess area was drained, and the patient was placed on antibiotics, which he has completed. The patient's general health otherwise had been stable. He was a smoker and had quit smoking for quite a while but then resumed again, but states he is not smoking as much. On examination, the patient had a functioning left lower quadrant colostomy and a well-healed lower vertical midline incision. Abdominal exam was otherwise unremarkable.   COURSE IN HOSPITAL: The patient was taken to the operating room on 12/17. A laparotomy was then performed with reopening of the previous incision, and some adhesions of the small bowel were taken down and adhesions of the small bowel to the pelvic area were freed until all of this could be displaced superiorly. Further exploration was performed, and it was noted that the bladder and the rectum had become a mesh into one and there were absolutely no identifiable landmarks to dissect out the rectum. An attempt was made to do this by way of placing a sizer through the rectum to see where the rectum ended and to see if the peritoneum could be opened over this, but even this appeared to have a 1 cm long, 1 cm  thick tissue, and it was felt that this was not suitable. The colostomy was positioned back in its place, even though it was more than adequate length to bring it down to the rectum critical area. After multiple attempts were made to identify the rectum, which were unsuccessful, it was felt that it was safer not to attempt any further dissection of this area or attempt a reanastomosis. Accordingly, the abdomen was closed. The patient subsequently was advised of these findings. He had a brief period of ileus, which  resolved without any difficulty. He was able to start on some clear liquid diet and advanced to a solid diet by day 4, which he tolerated well, and he was discharged home in stable condition on 09/24/2013. Given this news, which obviously caused him to become somewhat depressed, he was placed on Fluoxetine 10 mg oral capsule at bedtime daily, which seemed to help him significantly. At the time of discharge, the colostomy was functioning. His abdominal incision was healing satisfactorily. He was tolerating his diet, and no other problems were encountered.   FINAL DIAGNOSES: History of sigmoid resection and repair of colovesical fistula. Status post colostomy. Operation laparotomy. Unsuccessful attempt at reanastomosis of the colon.  ____________________________ S.Robinette Haines, MD sgs:jcm D: 10/10/2013 14:43:22 ET T: 10/10/2013 20:03:48 ET JOB#: [id number]  cc: Synthia Innocent. Jamal Collin, MD, <Dictator> [facility] Robinette Haines MD ELECTRONICALLY SIGNED 10/11/2013 18:04

## 2015-01-26 NOTE — Consult Note (Signed)
PATIENT NAME:  Rodney Vega, Rodney Vega MR#:  [id number] DATE OF BIRTH:  [birth year]  DATE OF CONSULTATION:  01/10/2012  REFERRING PHYSICIAN:   CONSULTING PHYSICIAN:  S.G. Jamal Collin, MD  HISTORY OF PRESENT ILLNESS: This is a 59 year old male who was admitted a couple of days ago with a complaint of significant lower abdominal pain. This seemed to start fairly abruptly on 04/05 going on to the early morning of 04/06 when he was seen in the Emergency Room and admitted. He is not sure if he had any fever or associated symptoms with it. Since admission to the hospital the patient is feeling notably better. He is still sore and has some pain in the lower abdomen but not as bad as there was earlier. About a week ago the patient had noted the urine was fairly dark in color. He was concerned about this, was evaluated at urgent care where it was noted that he had some blood in the urine. He subsequently had a CT scan which showed that there is suggestion of mild stenosis in the left ureteral junction with the pelvis and possibly a calculus in the kidney area. He already had a urologic evaluation. With regards to his abdominal pain he has never had any such pain in the past. He has had no previous history of any GI problems. He reportedly had a colonoscopy several years ago at [facility] and reportedly it was normal.   PAST MEDICAL HISTORY:  1. Significant for the fact that he has history of chronic tobacco use.   2. History of glaucoma.   MEDICATIONS: The only medicine that he is on is eyedrops for glaucoma.   PAST SURGICAL HISTORY:  1. Lumbar spine surgery in 2012 and also in 1997.  2. He has had a right inguinal hernia repair done when he was in the early 20s, reportedly this was done without mesh and he has not had any problems there since.    PHYSICAL EXAMINATION:  GENERAL: Healthy-appearing male who is not in any acute distress.   HEENT: Normocephalic. Pupils are equal and reacting. Conjunctiva is pink. Tongue  is moist, pink, and clear.   NECK: Supple. No nodes or masses palpable.   LUNGS: Clear to auscultation and percussion.   HEART: Sinus rhythm without any murmurs.   ABDOMEN: Soft abdomen with mild to moderate tenderness over the lower abdominal area, more so on the left lower quadrant and suprapubic region with some mild voluntary guarding. Bowel sounds are active at present.   EXTREMITIES: Free of any edema or cyanosis. Patient has good peripheral pulses.   LABORATORY, DIAGNOSTIC AND RADIOLOGICAL DATA: The patient has had two CT scans done, one on the 5th and one on the 6th both of which were reviewed showing that he has evidence of sigmoid diverticulitis with focal findings of perforation with 2 or 3 tiny pockets of extraluminal air. There does not appear to be any signs of free perforation either on CT or on physical examination. It is unknown if the colonoscopy he had before had revealed any evidence of diverticulosis but the patient is not aware of this. The patient also has what appears to be a very tiny lobe left lower lobe pulmonary nodule which also needs a follow up.   IMPRESSION: Acute episode of sigmoid diverticulitis. By history this is the first such episode and based on his hospital course it appears he is improving. His pain has lessened significantly. His white count has come down to normal today. I feel  it is reasonable to continue with the current regimen of Cipro and Flagyl for an additional couple of days until he is totally free of any fever and the pain level is minimal and tolerable with oral pain medicine. Long term I do not think he will require any surgical consideration unless he has evidence of recurrent problem in this area. I certainly think it would if it has been over five years since he had a colonoscopy it would be prudent to repeat this at some point once he is recovered fully from this acute episode. I will arrange for this when I see him as an outpatient after  discharge.   CURRENT RECOMMENDATIONS: Continue with Cipro and Flagyl. IV is to stay until his diet is advanced. I would certainly favor advancing his diet to a full liquid once his pain is reduced to a minimal level at which point he can be discharged home on oral Cipro and Flagyl.   I thank you for allowing me to evaluate and help in the care of this patient. I will continue to follow while he is in the hospital and also follow as an outpatient.    As far as the GU status I will discuss this with the urologist after they evaluate him again. The pulmonary nodule can likely be re-evaluated in about two to three months with dedicated CT of the chest if necessary.   ____________________________ S.Robinette Haines, MD sgs:cms D: 01/10/2012 17:16:15 ET T: 01/10/2012 17:42:22 ET JOB#: [id number]  cc: S.G. Jamal Collin, MD, <Dictator> [facility] Robinette Haines MD ELECTRONICALLY SIGNED 01/13/2012 8:45

## 2015-01-26 NOTE — Consult Note (Signed)
PATIENT NAME:  Rodney Vega, Rodney Vega MR#:  [id number] DATE OF BIRTH:  May 28, 1956  DATE OF CONSULTATION:  01/08/2012  REFERRING PHYSICIAN:  Deborra Medina, MD  CONSULTING PHYSICIAN:  Darcella Cheshire, MD  REASON FOR CONSULTATION: Hematuria with new diagnosis of left lower pole nephrolithiasis with question of partial ureteropelvic junction obstruction.   HISTORY OF PRESENT ILLNESS: The patient is a 59 year old Caucasian male who reports a 4 to 5 day history of dark coffee-ground-appearing urine with suprapubic abdominal irritation described as a burning sensation associated with urinary urgency. This prompted the patient to visit a local urgent care where a urinalysis was done which revealed "a lot of blood". The patient denies any fevers or chills and after visit to the urgent care had scheduled an appointment with Dr. Charyl Dancer, a local urologist, for this upcoming Monday. Over the past couple of days, however, the patient developed progressive pain in the suprapubic region described as cramping in nature and severe rating a 10 out of 10 prompting a visit to the [facility] Emergency Department yesterday and again today. The pain persisted until pain medicines were administered in the Emergency Department. In the Emergency Department yesterday, the patient's white blood cell count was 18.1 thousand, hematocrit 42.8%, and platelet count of 211,000. Creatinine was 0.95 and calcium was 8.6 with liver function tests being within normal limits. A urinalysis at that time revealed 8 red blood cells per high-power field and a CAT scan of the abdomen and pelvis with contrast revealed inflammation in the sigmoid colon with diverticulosis consistent with likely colitis versus sigmoid diverticulitis. Also noted was fullness of the left renal collecting system with multiple stones in the left lower pole measuring up to 10 mm in diameter. The patient was treated for diverticulitis and discharged but returned to the  [facility] Emergency Department this morning again with recurrent suprapubic severe crampy abdominal pain. Re-evaluation in the Emergency Department revealed improvement in the leukocytosis to 14.5 thousand with hematocrit of 40% and platelet count of 198,000, sodium 143 potassium 3.8, chloride 106, CO2 26, BUN 12, creatinine 1.08, glucose 139, calcium 8.4. LFTs within normal limits. Urinalysis revealed a pH of 5.0, specific gravity of 1.030. Microscopic evaluation revealed 129 red blood cells and 4 white blood cells. A stone protocol CT revealed persistence in the multiple left lower pole renal calculi measuring up to 10 mm but with progression in the sigmoid diverticulitis with possible microperforations. There was also persistence in right pelviectasis suggesting possible partial ureteropelvic junction obstruction. The patient is being admitted for treatment for his acute progressive acute diverticulitis with intravenous Cipro and Flagyl and Urology is being consulted for further evaluation of the patient's gross hematuria with new onset of stone disease and possible partial ureteropelvic junction obstruction. The patient denies any personal or family history of urolithiasis. The patient also denies any past history of left flank pain. The patient denies any flank pain specifically after increased fluid intake or after caffeinated beverages or alcoholic beverages. The patient also denies any pneumaturia or fecaluria.   PAST MEDICAL HISTORY:   1. Degenerative disk disease of the lumbar spine.  2. Glaucoma.  PAST SURGICAL HISTORY: 1. Status post laminectomy in 1997.  2. Status post L4-L5 spine surgery in June of 2012 by Dr. Vertell Limber at [facility].   MEDICATIONS: 1. Eyedrops for glaucoma.  2. Tramadol p.r.n. status post his L4-L5 back surgery in June of 2012.  ALLERGIES: No known drug allergies.   FAMILY HISTORY:  Again, negative for urolithiasis but positive for prostate  cancer in his father and positive for hypertension in his mother.  SOCIAL HISTORY: Tobacco; the patient is an active smoker with a greater than 30-pack-year smoking history with a brief period of smoking cessation for five years in the past. The patient is encouraged to consider smoking cessation once again. Alcohol; the patient reports social intake of alcohol. 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   REVIEW OF SYSTEMS: CONSTITUTIONAL: As per the history of present illness (no fevers or chills). HEENT: The patient denies any recent colds or flus. RESPIRATORY: The patient denies any shortness of breath. CARDIAC: The patient denies any chest pain. GASTROINTESTINAL: The patient reports normal bowel movements, however, reports a sense of incomplete rectal emptying for the past 3 to 4 months but denies any blood per rectum. GENITOURINARY: As per the history of present illness. SKIN: The patient denies any rashes or lesions. MUSCULOSKELETAL: The patient denies any joint pain or discomfort except for chronic mild lower back pain status post back surgery. ENDOCRINE: The patient denies any thyroid dysfunction. HEMATOLOGIC AND LYMPHATIC: The patient denies any bleeding diathesis or adenopathy. NEUROLOGIC: The patient denies any lateralizing weakness. PSYCHIATRIC: The patient denies any history of depression.  PHYSICAL EXAMINATION:   VITAL SIGNS: Temperature 99.7 degrees Fahrenheit, pulse 90 and regular, respiratory rate 20 and unlabored, blood pressure 118/73, oxygen saturation 95% on room air.   GENERAL: The patient is a well developed, well nourished white male in no apparent distress.   SKIN: No rashes or lesions about the head and neck.   HEENT: Normocephalic and atraumatic. Extraocular movements intact. Anicteric.   NECK: No masses. No bruits.   CHEST: Clear to auscultation with normal respiratory effort.   CARDIOVASCULAR: Regular rate and rhythm without murmurs,  gallops, or rubs. 2+ radial pulses bilaterally.   ABDOMEN: Soft, nondistended but tender in the suprapubic region and the left lower quadrant without guarding or rebound. No right lower quadrant abdominal pain. No costovertebral angle tenderness.  GENITOURINARY: Normal circumcised phallus with bilaterally descended testes that were nontender and without masses.   RECTAL: Deferred to Dr. Madelin Headings after resolution of the patient's diverticulitis.   EXTREMITIES: No edema.   MUSCULOSKELETAL: Normal range of movement.   NEUROLOGIC: Nonfocal.  PSYCHIATRIC: Alert and oriented x4, pleasant and cooperative and appropriate for admission.   ADMISSION LABORATORY DATA: As per the history of present illness. Additionally, a stone CT again in the Emergency Department revealed a probable right adrenal adenoma with multiple stones in the left lower pole of the left kidney measuring up to 10 mm with progressive sigmoid diverticulitis with possible microperforations.    ASSESSMENT:  1. Gross hematuria. The patient has multiple possible etiologies for the gross hematuria including multiple stones in the left lower pole of the left kidney as well as possible partial ureteropelvic junction obstruction and certainly his sigmoid diverticulitis with possible microperforation. The area of the sigmoid inflammation appears to be right at the left dome of the bladder on the CT scan. Given the patient's active smoking history, the patient is urged to keep his follow-up with Dr. Charyl Dancer of [place] on Monday if he is discharged from the hospital in time; if not, upon discharge from the hospital again for complete evaluation of his gross hematuria as well as his new onset of stone disease and possible ureteropelvic junction obstruction.  2. Left lower pole renal calculi. The patient denies any past history  or family history of urolithiasis. Currently the stones are asymptomatic. Given the multiple  stones and the large size of the stones, however, he would certainly benefit from a stone risk profile evaluation after resolution of the diverticulitis.  3. 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 The patient is asymptomatic. However, this may need to be addressed in order for treatment of his left lower pole renal calculi. The patient was advised to maintain follow-up with Dr. Charyl Dancer of [place] for further evaluation after resolution of his diverticulitis.  4. Positive family history of adenocarcinoma of the prostate. The patient is advised to have his PSA blood test done and digital rectal examination of the prostate performed on an annual basis and again is urged to have this done after resolution of the diverticulitis with Dr. Charyl Dancer of [place].  5. Right adrenal mass/possible adrenal adenoma. Functional evaluation should be considered especially if surgery is anticipated.   Again, thank you very much for the opportunity to participate in the care of this most pleasant gentleman.  ____________________________ Darcella Cheshire, MD jhk:drc D: 01/08/2012 22:43:07 ET T: 01/10/2012 09:51:44 ET JOB#: [id number]  cc: Darcella Cheshire, MD, <Dictator> Deborra Medina, MD Alcus Dad, MD [place] SIGNED 01/22/2012 16:00

## 2015-01-26 NOTE — H&P (Signed)
PATIENT NAME:  Rodney Vega, Rodney Vega MR#:  [id number] DATE OF BIRTH:  05-Sep-1956  DATE OF ADMISSION:  01/08/2012  CHIEF COMPLAINT: Lower abdominal pain.   HISTORY OF PRESENT ILLNESS: Rodney Vega is a 59 year old white male with a history of chronic tobacco use over 30 years who presents with a five-day history of dark-colored urine and lower midline suprapubic discomfort accompanied by incomplete voiding and nausea with dry heaves. The patient came to the ER one day prior to admission with complaints of pain and diarrhea accompanied by increased bowel gas and decreased bowel movements and was treated for diverticulosis and sent home. He returns today with continued pain and worsening pain and was found to also have nephrolithiasis on repeat CAT scan. He notes that over the past several days he has had subjective fevers and chills, but his temperature has actually been measuring less than 98.4. His girlfriend/wife did set him up to see Dr. Madelin Headings, the urologist, as an outpatient when he first started having some urinary issues five days ago. He has never had a history of kidney stones and has no history of diverticulosis or diverticulitis. His last colonoscopy was over 10 years ago at [facility] and he has not had any medical follow up since then. His last meal was one day prior to admission where he had some chicken broth which did not cause any immediate abdominal pain or vomiting, but he has noted some episodes of nausea with dry heaves a couple of times over the last 48 hours.   The patient does not have a primary care doctor. He has no past medical history, other than chronic tobacco abuse. He does actually have a history of glaucoma and takes eye drops for this. He is not sure the name of them.   MEDICATIONS: Eyedrops for glaucoma.   PAST SURGICAL HISTORY:  1. L4-L5 lumbar spine surgery in June 2012 by Dr. Vertell Limber at [facility].  2. Back surgery in 1997, in the same area, and notes that since then he has  had some trouble with urinary retention.   DRUG ALLERGIES: No known drug allergies.   LAST HOSPITALIZATION: In 2012 at [facility].   FAMILY HISTORY: Father had a myocardial infarction in his 83s and died at age 52 from complications secondary to treatment for prostate cancer. Mother is alive and well with hypertension.   SOCIAL HISTORY: He is married. He is a daily smoker with a 30+ pack-year history. He did quit once for five years but resumed secondary to stressors at home. He is an occasional social drinker. 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   REVIEW OF SYSTEMS: Positive for subjective fevers and pain. No weight changes noted. He has a history of glaucoma, but his vision has been stable. He denies tinnitus, hearing loss, seasonal rhinitis, and trouble swallowing. He denies any cough, wheeze, hemoptysis, or dyspnea. He has no known history of chronic obstructive pulmonary disease. He denies chest pain, orthopnea, edema, and dyspnea on exertion. He has had nausea and vomiting with diarrhea and abdominal pain, per history of present illness. He denies dysuria, hematuria, and any prior history of renal calculi. He has noted some urinary retention over the last several years. He has no history of polyuria, nocturia, or thyroid problems. No history of anemia, easy bruising, or bleeding. He does not have a history of skin cancers or changes in moles, hair, or skin. He denies any chronic  neck, back, shoulder, knee, or hip pain. He does have occasional numbness in his left leg since his surgery. He has no history of anxiety or insomnia or any other psychiatric illness.   PHYSICAL EXAMINATION:   GENERAL: This is a well nourished, well developed male in no apparent distress.   VITAL SIGNS: Initial vital signs at 8:30 this morning: Pulse was 77 and regular, temperature 97.8, respirations 20, blood pressure 113/65, and pulse oximetry 100%. Pain was  10/10.  Repeat vital signs at my examination: Blood pressure 106/51, pulse 73, respirations 18, and saturating 95% on room air.   HEENT: Pupils are equal, round, and reactive to light. Extraocular movements are intact. Oropharynx is benign. Teeth are in good condition.   NECK: Supple without lymphadenopathy, JVD, thyromegaly, or carotid bruits.   LUNGS: Clear to auscultation bilaterally with no wheezing or rhonchi.   CARDIOVASCULAR: Regular rate and rhythm with no murmurs, rubs, or gallops.   ABDOMEN: Soft. He is guarding in the lower abdominal area. There is no rebound or masses.   GYN: Deferred. He has 5/5 strength in all four extremities.   SKIN: Skin is warm and dry without rashes or lesions.   LYMPH: He has no cervical, axillary, inguinal, or supraclavicular lymphadenopathy.   NEUROLOGIC: Grossly nonfocal with intact deep tendon reflexes. He is alert and oriented to person, place, and time.   ADMISSION LABS/STUDIES: Sodium 143, potassium 3.8, chloride 106, bicarbonate 26, BUN 12, creatinine 1.08, and glucose 139. White count 14.5, hemoglobin 13.3, and platelets 198. Liver function tests are normal.   Urinalysis has a specific gravity of 1.03 with 50 glucose, 4 white cells, and 129 red cells.   Three-way of the abdomen shows no signs of obstruction. It does show bilateral interstitial opacities.   CT of chest, abdomen and pelvis shows a 3 mm nodule in the left lower lobe of the lung, multiple calculi in the inferior pole of the left kidney the largest one being 9 to 10 mm in diameter, and diverticulosis noted with diverticulitis and evidence of free air suggestive of micro perforations. Left ureter is enlarged and there is suggestion of UPJ stenosis, on the left.   ASSESSMENT AND PLAN:  1. Diverticulitis, acute: Admit for IV fluids, n.p.o. status, and IV antibiotics with Cipro and Flagyl, already started in the ED. I have discussed with the patient the significance of the micro  perforations and the eventual need to see a general surgeon to deal with this area that may prove to be problematic in the future. We will refer to Dr. Jamal Collin and Dr. Bary Castilla as an outpatient, unless he worsens in house.  2. Nephrolithiasis with hydroureter and UPJ stenosis: The patient has an appointment with Dr. Madelin Headings at [place] which we will try to get moved up to an in-house visit while he is here. We will follow his urine output and if he has trouble voiding or his urine output drops, urgent urologic consult will be issued.  3. Pulmonary nodule: The patient has a long history of tobacco use. Therefore, this nodule needs to be viewed suspiciously. We will refer as an outpatient to local pulmonology with Dr. Lake Bells at [facility] here in [place].  4. Tobacco abuse: I have spoken with the patient today about his need to quit smoking. He has successfully quit in the past for as long as five years, but resumed due to stressors. We will place a patch on him for comfort while he is here. He is motivated  to quit, especially since he is anticipating having to have surgery on his bowels in the near future. The pulmonary nodule has not been discussed with him yet as this was found after the patient was evaluated.   ESTIMATED TIME OF CARE: 60 minutes. ____________________________ Deborra Medina, MD tt:slb D: 01/08/2012 15:09:17 ET T: 01/08/2012 15:35:49 ET JOB#: [id number]  cc: Deborra Medina, MD, <Dictator> Deborra Medina MD ELECTRONICALLY SIGNED 02/10/2012 18:46

## 2015-01-26 NOTE — Discharge Summary (Signed)
PATIENT NAME:  Rodney Vega, Rodney Vega MR#:  [id number] DATE OF BIRTH:  Dec 07, 1955  DATE OF ADMISSION:  01/08/2012 DATE OF DISCHARGE:  01/12/2012  ADMITTING DIAGNOSIS: Diverticulitis   DISCHARGE DIAGNOSES: 1. Colon diverticulitis with microperforation.  2. Leukocytosis.  3. Dehydration.  4. Nephrolithiasis.  5. Pulmonary nodules.  6. Hematuria, resolved.  7. Ongoing tobacco use.  8. History of glaucoma.  9. History of lumbar surgery in the past.   DISCHARGE CONDITION: Fair.   DISCHARGE MEDICATIONS:  1. Patient is to start Flagyl 500 mg p.o. every eight hours, total for 14 days. Patient is to continue antibiotic therapy to complete 14 day course. 2. Zofran 8 mg twice daily as needed.  3. Percocet 5/325 mg 1 tablet every six hours as needed.  4. Ciprofloxacin 500 mg p.o. twice daily, 14 days.  5. Dorzolamide timolol 2.23%/0.68% ophthalmic solution one drop to each affected eye twice daily.  6. Latanoprost 0.005% ophthalmic solution one drop to each affected eye once at bedtime   DIET: Mechanical soft.   ACTIVITY LIMITATIONS: As tolerated.    FOLLOW UP: Follow-up appointment with Dr. Jamal Collin in three days after discharge; Dr. Madelin Headings in three days after discharge; Dr. Clayborn Bigness in 2 to 3 days after discharge to establish primary care physician.    CONSULTANTS:  1. Dr. Jamal Collin. 2. Dr. Delma Officer.  HISTORY OF PRESENT ILLNESS: Patient is a 59 year old Caucasian male with history of glaucoma as well as tobacco abuse presented to the hospital with complaints of lower abdominal pain. Please refer to Dr. Hulen Shouts admission on 01/08/2012. On arrival to the hospital patient's pulse was 77 and regular, temperature 97.8, respirations were 20, blood pressure 113/65, pulse ox 100% on room air. Physical examination was remarkable for guarding in lower abdominal area but no rebound or masses were noted.   LABORATORY, DIAGNOSTIC AND RADIOLOGICAL DATA: CT of abdomen and pelvis with contrast 01/07/2012:  Findings concerning for acute diverticulitis versus colitis in the sigmoid region. No abscess formation is present. No definite perforation is identified. It would be difficult to exclude microscopic perforation in the region of image 118 and 119 as well as 120. No abscess formation is evident. 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 Urologic follow up is recommended. Colonic diverticular disease is present. The lung bases show dependent atelectasis but are otherwise clear. CT of abdomen and pelvis without contrast for stones 01/08/2012 revealed acute sigmoid diverticulitis. There are a few small foci of extraluminal air which are concerning for microperforation. No discrete extraluminal fluid collection is noted. Direct visualization is suggested after the acute episode as malignancy can have similar appearance. Mild bowel wall thickening of the adjacent small bowel is likely reactive. Mild dilatation of left renal collecting system similar to prior. The majority of the excreted contrast material has resolved from the left renal collecting system. Findings may represent some degree of partial left ureteropelvic junction obstruction. Left-sided nephrolithiasis. Indeterminate 3 mm nodule in left lower lobe. This patient is at low risk of lung cancer. No further follow-up is recommended. If patient is high risk for lung cancer recommend 12 month follow-up with noncontrast chest CT according to radiologist. Abdomen three-way including PA of chest 01/08/2012 showed no evidence of bowel obstruction. There are findings of diverticulosis of sigmoid colon. The findings of diverticulitis were better visualized in recent prior abdominal CT. Bilateral interstitial pulmonary opacities may represent interstitial pulmonary edema or atypical infection. Prominence of the bilateral  hilum may be related to residual opacities, however, follow up  chest radiographs are recommended to ensure resolution. Abdomen three views 01/09/2012: Nonobstructive bowel gas pattern. No large amount of free intraperitoneal air identified. The foci of extraluminal air seen on CT would be too small to visualize by radiography according to radiologist.   Lab data on 01/07/2012 showed glucose 159, otherwise BMP was unremarkable. Patient's lipase was normal at 163. Patient's liver enzymes were normal. White blood cell count was markedly elevated to 18.1, hemoglobin 14.5, platelet count 211. Urinalysis showed yellow clear urine, more than 500 glucose, negative bilirubin, 1+ ketones, specific gravity 1.020, pH 5.0, 2+ blood, negative for protein, nitrites, or leukocyte esterase, 8 red blood cells, 1 white blood cell, no bacteria were seen, less than 1 epithelial cell as well as mucus was present. Occult blood in feces study was negative.   HOSPITAL COURSE: Patient was admitted to the hospital. He was started on antibiotic therapy and consultation with Dr. Jamal Collin as well as Dr. Maudie Mercury were obtained. Dr. Delma Officer, urologist, saw patient on 01/08/2012. He felt that patient has gross hematuria and has multiple potential etiologies for that. 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 He urged to keep his scheduled appointment with Dr. Madelin Headings in the next few days after discharge. Patient understands the possibility of  genitourinary neoplasm. He also has new onset of ureterolithiasis with multiple stones up 10 mm in LLP, however, current asymptomatic.   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  In regards to questionable right adrenal mass, questionable adrenal adenoma needs to be worked up. Dr. Maudie Mercury felt that there was no indication for urgent urology intervention as patient had no left flank pain. He recommended to keep appointment with Dr. Madelin Headings of  [place] as scheduled or as soon as diverticulitis is resolved. Also patient will need annual PSA given positive family history of prostate cancer in first degree relative such as father. Also functional evaluation of right adrenal mass will be recommended as well as serial imaging to rule out growth and patient would need to follow up with general surgery for that according to Dr. Maudie Mercury. Dr. Jamal Collin saw patient in consultation and followed him along. He felt that patient's pain was improving as well as white blood cell count. His vital signs remained stable and on abdominal exam patient's abdomen was found to be soft, scant tenderness in left lower quadrant as well as suprapubic was still noted. White blood cell count was noted to be around 13,000 on the day of discharge. It was a little up from day before, however, patient was feeling better and had no fever. He recommended discharge patient on Cipro and Flagyl as well as pain medications and follow up with him in the office next week. Patient was also advised on diet and recommended to stay off work until he is seen in one week by Dr. Jamal Collin.   Patient is being discharged with above-mentioned medications and follow-up. His vital signs on the day of discharge: Temperature 98, pulse 91, respiratory rate 20, blood pressure 124/78, saturation 96% on room air at rest.   In regards to other medical issues such as a pulmonary nodule, patient is to make an appointment with primary care with Dr. Clayborn Bigness and make decisions about evaluation with repeated CT scan as outpatient.   In regards to hematuria, again patient is to follow  up with urologist.  For tobacco abuse, patient was counseled extensively, was recommended to quit.  For history of glaucoma, he is to continue his outpatient medications.   Patient is being discharged in stable condition with above-mentioned medications and follow up.   TIME SPENT: 40 minutes.    ____________________________ Theodoro Grist, MD rv:cms D: 01/12/2012 20:27:14 ET T: 01/13/2012 13:47:19 ET JOB#: [id number]  cc: Theodoro Grist, MD, <Dictator> Lavera Guise, MD Alcus Dad, MD Mckinley Jewel, MD Theodoro Grist MD ELECTRONICALLY SIGNED 01/27/2012 20:16

## 2015-01-26 NOTE — Consult Note (Signed)
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  Gross Hematuriapt has multiple potential etiologies (LLP Renal Calculi, ?Partial Left UPJO, Sigmoid Diverticulitis adjacent to the left bladder dome, extensive active smoking hx)the pt keep his scheduled appt with Dr. Alcus Dad this Monday, if discharged, or promptly after resolution of his current diverticulitis -the pt understands the possibility of a GU neoplasm New Onset of Urolithiasis (multiple stones, up to 21m, in the LLP)asymptomatic; FH negative for stones ?Partial Left UPJO FH of Prostate Cancer (Father) Right Adrenal Mass (?adrenal adenoma)  No indication for urgent urologic intervention (no left flank pain)Encourage pt to keep his appt with Dr. JAlcus Dadof [facility] scheduled this Monday, if discharged, or as soon as his diverticulitis is resolved.Annual PSA's/DRE's, given his positive FH of Prostate Cancer in a first degree relative (Father)Functional evalution of the right adrenal mass (and serial imaging to r/o growth) with his General Surgery follow up. you for the opportunity to participate in the care of this most pleasant gentleman.  Electronic Signatures: KDarcella Cheshire(MD)  (Signed on 06-Apr-13 23:10)  Authored  Last Updated: 06-Apr-13 23:10 by KDarcella Cheshire(MD)

## 2015-03-05 ENCOUNTER — Encounter: Payer: Self-pay | Admitting: *Deleted

## 2015-03-05 NOTE — Progress Notes (Signed)
  He came by the office and was just wondering if Dr Jamal Collin had made any arrangements for his referral for colostomy reversal ?

## 2015-10-01 ENCOUNTER — Telehealth: Payer: Self-pay | Admitting: *Deleted

## 2015-10-01 NOTE — Telephone Encounter (Signed)
She called asking if Dr Jamal Collin could fax notes to a Dr Gwinda [place] [facility] ph [phone] or [phone] fax [phone] for colostomy reversal consult, ok per Dr Jamal Collin, notes faxed. She called back to let us know his appointment is scheduled for January 11 at 10:00.

## 2015-10-13 ENCOUNTER — Emergency Department
Admission: EM | Admit: 2015-10-13 | Discharge: 2015-10-13 | Disposition: A | Discharge status: Home / Self Care | Payer: BC Managed Care – PPO | Attending: Emergency Medicine | Admitting: Emergency Medicine

## 2015-10-13 ENCOUNTER — Encounter: Payer: Self-pay | Admitting: Emergency Medicine

## 2015-10-13 DIAGNOSIS — Z79899 Other long term (current) drug therapy: Secondary | ICD-10-CM | POA: Diagnosis not present

## 2015-10-13 DIAGNOSIS — L089 Local infection of the skin and subcutaneous tissue, unspecified: Secondary | ICD-10-CM | POA: Diagnosis present

## 2015-10-13 DIAGNOSIS — F17200 Nicotine dependence, unspecified, uncomplicated: Secondary | ICD-10-CM | POA: Diagnosis not present

## 2015-10-13 DIAGNOSIS — L02415 Cutaneous abscess of right lower limb: Secondary | ICD-10-CM | POA: Insufficient documentation

## 2015-10-13 DIAGNOSIS — L03119 Cellulitis of unspecified part of limb: Secondary | ICD-10-CM

## 2015-10-13 DIAGNOSIS — L03115 Cellulitis of right lower limb: Secondary | ICD-10-CM | POA: Diagnosis not present

## 2015-10-13 DIAGNOSIS — L02419 Cutaneous abscess of limb, unspecified: Secondary | ICD-10-CM

## 2015-10-13 MED ORDER — SULFAMETHOXAZOLE-TRIMETHOPRIM 800-160 MG PO TABS: 1.0000 | ORAL_TABLET | Freq: Two times a day (BID) | ORAL | Status: DC

## 2015-10-13 NOTE — Discharge Instructions (Signed)
Cellulitis Cellulitis is an infection of the skin and the tissue beneath it. The infected area is usually red and tender. Cellulitis occurs most often in the arms and lower legs.  CAUSES  Cellulitis is caused by bacteria that enter the skin through cracks or cuts in the skin. The most common types of bacteria that cause cellulitis are staphylococci and streptococci. SIGNS AND SYMPTOMS   Redness and warmth.  Swelling.  Tenderness or pain.  Fever. DIAGNOSIS  Your health care provider can usually determine what is wrong based on a physical exam. Blood tests may also be done. TREATMENT  Treatment usually involves taking an antibiotic medicine. HOME CARE INSTRUCTIONS   Take your antibiotic medicine as directed by your health care provider. Finish the antibiotic even if you start to feel better.  Keep the infected arm or leg elevated to reduce swelling.  Apply a warm cloth to the affected area up to 4 times per day to relieve pain.  Take medicines only as directed by your health care provider.  Keep all follow-up visits as directed by your health care provider. SEEK MEDICAL CARE IF:   You notice red streaks coming from the infected area.  Your red area gets larger or turns dark in color.  Your bone or joint underneath the infected area becomes painful after the skin has healed.  Your infection returns in the same area or another area.  You notice a swollen bump in the infected area.  You develop new symptoms.  You have a fever. SEEK IMMEDIATE MEDICAL CARE IF:   You feel very sleepy.  You develop vomiting or diarrhea.  You have a general ill feeling (malaise) with muscle aches and pains.   This information is not intended to replace advice given to you by your health care provider. Make sure you discuss any questions you have with your health care provider.   Probiotics  WHAT ARE PROBIOTICS?  Probiotics are the good bacteria and yeasts that live in your body and keep  you and your digestive system healthy. Probiotics also help your body's defense (immune) system and protect your body against bad bacterial growth.  Certain foods contain probiotics, such as yogurt. Probiotics can also be purchased as a supplement. As with any supplement or drug, it is important to discuss its use with your health care provider.  WHAT AFFECTS THE BALANCE OF BACTERIA IN MY BODY?  The balance of bacteria in your body can be affected by:  Antibiotic medicines. Antibiotics are sometimes necessary to treat infection. Unfortunately, they may kill good or friendly bacteria in your body as well as the bad bacteria. This may lead to stomach problems like diarrhea, gas, and cramping.  Disease. Some conditions are the result of an overgrowth of bad bacteria, yeasts, parasites, or fungi. These conditions include:  Infectious diarrhea.  Stomach and respiratory infections.  Skin infections.  Irritable bowel syndrome (IBS).  Inflammatory bowel diseases.  Ulcer due to Helicobacter pylori (H. pylori) infection.  Tooth decay and periodontal disease.  Vaginal infections. Stress and poor diet may also lower the good bacteria in your body.  WHAT TYPE OF PROBIOTIC IS RIGHT FOR ME?  Probiotics are available over the counter at your local pharmacy, health food, or grocery store. They come in many different forms, combinations of strains, and dosing strengths. Some may need to be refrigerated. Always read the label for storage and usage instructions.  Specific strains have been shown to be more effective for certain conditions. Ask your  health care provider what option is best for you.  WHY WOULD I NEED PROBIOTICS?  There are many reasons your health care provider might recommend a probiotic supplement, including:  Diarrhea.  Constipation.  IBS.  Respiratory infections.  Yeast infections.  Acne, eczema, and other skin conditions.  Frequent urinary tract infections (UTIs). ARE THERE SIDE EFFECTS OF  PROBIOTICS?  Some people experience mild side effects when taking probiotics. Side effects are usually temporary and may include:  Gas.  Bloating.  Cramping. Rarely, serious side effects, such as infection or immune system changes, may occur.  WHAT ELSE DO I NEED TO KNOW ABOUT PROBIOTICS?  There are many different strains of probiotics. Certain strains may be more effective depending on your condition. Probiotics are available in varying doses. Ask your health care provider which probiotic you should use and how often.  If you are taking probiotics along with antibiotics, it is generally recommended to wait at least 2 hours between taking the antibiotic and taking the probiotic.  FOR MORE INFORMATION:  [facility] [email or web address]  This information is not intended to replace advice given to you by your health care provider. Make sure you discuss any questions you have with your health care provider.  Document Released: 04/17/2014 Document Reviewed: 04/17/2014  Elsevier Interactive Patient Education 2016 [place] Released: 06/30/2005 Document Revised: 06/11/2015 Document Reviewed: 12/06/2011 Elsevier Interactive Patient Education [employer].

## 2015-10-13 NOTE — ED Provider Notes (Signed)
[facility] Emergency Department Provider Note ?  ? ____________________________________________ ? Time seen: 4:46 PM ? I have reviewed the triage vital signs and the nursing notes.  ________ HISTORY ? Chief Complaint Abscess     HPI  Rodney Vega is a 60 y.o. male   who presents to emergency department complaining of "skin infection" to his right inguinal region. Patient states that he initially noticed area 2-3 days ago. He thought at first that he had a "pimple" in that region. He states over the intervening. He has had increased pain and redness. Patient denies taking any medications prior to arrival. He denies any fevers or chills, abdominal pain, nausea or vomiting. ? ? ? Past Medical History  Diagnosis Date  . Diverticulitis   . Glaucoma     since 2004  . Hernia 1979  . Personal history of tobacco use, presenting hazards to health   . Kidney stone 2013  . Special screening for malignant neoplasms, colon   . Screening for obesity     Patient Active Problem List   Diagnosis Date Noted  . 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 Past Surgical History  Procedure Laterality Date  . Back surgery  1994, 2012  . Hernia repair  1979  . Tonsillectomy and adenoidectomy  1962  . Lithotripsy  2013  . Colonoscopy  [id number]    [facility]/Dr. Buelah Manis sessile polyp,18m, found in rectum,multiple diverticula were found in the sigmoid, in descending and in transverse colon  . Laparotomy  04-20-13    colon resection with colosotmy  . Colostomy  04-20-13   ? Current Outpatient Rx  Name  Route  Sig  Dispense  Refill  . Acetaminophen (TYLENOL ARTHRITIS PAIN PO)   Oral   Take by mouth as needed.         . diphenhydramine-acetaminophen (TYLENOL PM) 25-500 MG TABS   Oral   Take 1 tablet by mouth at bedtime as needed.         . dorzolamide-timolol (COSOPT) 22.3-6.8 MG/ML ophthalmic solution      Frequency:BID   Dosage:0.0     Instructions:  Note:Dose:  2%-0.5%         . latanoprost (XALATAN) 0.005 % ophthalmic solution      Administer 1 drop to both eyes nightly.         . Multiple Vitamin (MULTIVITAMIN) tablet   Oral   Take 1 tablet by mouth daily.         .Marland Kitchensulfamethoxazole-trimethoprim (BACTRIM DS,SEPTRA DS) 800-160 MG tablet   Oral   Take 1 tablet by mouth 2 (two) times daily.   14 tablet   0   . venlafaxine XR (EFFEXOR-XR) 37.5 MG 24 hr capsule      1 po qd x 1-2 wks,then 2 po qd if needed          ? Allergies Aleve ? History reviewed. No pertinent family history. ? Social History Social History  Substance Use Topics  . Smoking status: Current Every Day Smoker -- 1.00 packs/day for 20 years  . Smokeless tobacco: Never Used  . Alcohol Use: Yes   ? Review of Systems Constitutional: no fever. Eyes: no discharge ENT: no sore throat. Cardiovascular: no chest pain. Respiratory: no cough. No sob Gastrointestinal: denies abdominal pain, vomiting, diarrhea, and constipation Genitourinary: no dysuria. Negative for hematuria Musculoskeletal: Negative for back pain. Skin: Negative for rash. Endorses erythematous and edematous lesion to right inguinal region.  Neurological: Negative for headaches  10-point  ROS otherwise negative.  _______________ PHYSICAL EXAM: ? VITAL SIGNS:   ED Triage Vitals  Enc Vitals Group     BP 10/13/15 1313 160/78 mmHg     Pulse Rate 10/13/15 1313 96     Resp 10/13/15 1313 18     Temp 10/13/15 1313 97.5 F (36.4 C)     Temp Source 10/13/15 1313 Oral     SpO2 10/13/15 1313 97 %     Weight 10/13/15 1313 223 lb (101.152 kg)     Height 10/13/15 1313 '5\' 11"'$  (1.803 m)     Head Cir --      Peak Flow --      Pain Score 10/13/15 1310 0     Pain Loc --      Pain Edu? --      Excl. in [place]? --    ?  Constitutional: Alert and oriented. Well appearing and in no distress. Eyes: Conjunctivae are normal.  ENT      Head: Normocephalic and atraumatic.      Ears:       Nose: No  congestion/rhinnorhea.      Mouth/Throat: Mucous membranes are moist.   Hematological/Lymphatic/Immunilogical: No cervical lymphadenopathy. Cardiovascular: Normal rate, regular rhythm. Normal S1 and S2. Respiratory: Normal respiratory effort without tachypnea nor retractions. Lungs CTAB. Gastrointestinal: Soft and nontender. No distention. There is no CVA tenderness. Genitourinary:  Musculoskeletal: Nontender with normal range of motion in all extremities.  Neurologic:  Normal speech and language. No gross focal neurologic deficits are appreciated. Skin:  Skin is warm, dry and intact. No rash noted. erythematous and edematous lesion noted to the right inguinal region. Area is approximately 5 cm in diameter. Area is firm to palpation. No fluctuance is noted. No drainage is noted.  Psychiatric: Mood and affect are normal. Speech and behavior are normal. Patient exhibits appropriate insight and judgment.    LABS (all labs ordered are listed, but only abnormal results are displayed)  Labs Reviewed - No data to display  ___________ RADIOLOGY    _____________ PROCEDURES ? Procedure(s) performed:    Medications - No data to display  ______________________________________________________ INITIAL IMPRESSION / ASSESSMENT AND PLAN / ED COURSE ? Pertinent labs & imaging results that were available during my care of the patient were reviewed by me and considered in my medical decision making (see chart for details).    [place] diagnosis is consistent with cellulitis to the right inguinal region. Patient will be placed on antibiotics. He will also be instructed to take probiotics at home to prevent GI upset. Patient may take Tylenol and ibuprofen for pain control. Patient will follow-up with his primary care for any since persisting past this treatment course.    New Prescriptions   SULFAMETHOXAZOLE-TRIMETHOPRIM (BACTRIM DS,SEPTRA DS) 800-160 MG TABLET    Take 1 tablet by mouth 2 (two)  times daily.   ____________________________________________ FINAL CLINICAL IMPRESSION(S) / ED DIAGNOSES?  Final diagnoses:  Cellulitis and abscess of lower extremity            Darletta Moll, PA-C 10/13/15 1646  Lisa Roca, MD 10/13/15 2344

## 2015-10-13 NOTE — ED Notes (Signed)
Pt to ed with c/o right leg abscess x 3 days.

## 2015-10-13 NOTE — ED Notes (Signed)
States abscess area to right leg for couple of days

## 2017-02-19 DIAGNOSIS — Z79899 Other long term (current) drug therapy: Secondary | ICD-10-CM | POA: Insufficient documentation

## 2017-02-19 DIAGNOSIS — F17200 Nicotine dependence, unspecified, uncomplicated: Secondary | ICD-10-CM | POA: Insufficient documentation

## 2017-02-19 DIAGNOSIS — R1084 Generalized abdominal pain: Secondary | ICD-10-CM | POA: Diagnosis present

## 2017-02-19 DIAGNOSIS — R110 Nausea: Secondary | ICD-10-CM | POA: Insufficient documentation

## 2017-02-19 DIAGNOSIS — R531 Weakness: Secondary | ICD-10-CM | POA: Insufficient documentation

## 2017-02-19 NOTE — ED Triage Notes (Signed)
Reports generalized weakness, abdominal pain and nausea all day.

## 2017-02-20 ENCOUNTER — Emergency Department
Admission: EM | Admit: 2017-02-20 | Discharge: 2017-02-20 | Disposition: A | Discharge status: Home / Self Care | Payer: BC Managed Care – PPO | Attending: Emergency Medicine | Admitting: Emergency Medicine

## 2017-02-20 ENCOUNTER — Emergency Department: Payer: BC Managed Care – PPO

## 2017-02-20 DIAGNOSIS — R1084 Generalized abdominal pain: Secondary | ICD-10-CM

## 2017-02-20 DIAGNOSIS — R110 Nausea: Secondary | ICD-10-CM

## 2017-02-20 LAB — URINALYSIS, COMPLETE (UACMP) WITH MICROSCOPIC
BILIRUBIN URINE: NEGATIVE
Bacteria, UA: NONE SEEN
KETONES UR: NEGATIVE mg/dL
LEUKOCYTES UA: NEGATIVE
Nitrite: NEGATIVE
PROTEIN: NEGATIVE mg/dL
Specific Gravity, Urine: 1.045 — ABNORMAL HIGH (ref 1.005–1.030)
pH: 5 (ref 5.0–8.0)

## 2017-02-20 LAB — CBC
HCT: 48.7 % (ref 40.0–52.0)
HEMOGLOBIN: 16.8 g/dL (ref 13.0–18.0)
MCH: 31.8 pg (ref 26.0–34.0)
MCHC: 34.4 g/dL (ref 32.0–36.0)
MCV: 92.5 fL (ref 80.0–100.0)
Platelets: 272 10*3/uL (ref 150–440)
RBC: 5.26 MIL/uL (ref 4.40–5.90)
RDW: 13.8 % (ref 11.5–14.5)
WBC: 24 10*3/uL — AB (ref 3.8–10.6)

## 2017-02-20 LAB — COMPREHENSIVE METABOLIC PANEL
ALT: 22 U/L (ref 17–63)
ANION GAP: 10 (ref 5–15)
AST: 34 U/L (ref 15–41)
Albumin: 4.5 g/dL (ref 3.5–5.0)
Alkaline Phosphatase: 76 U/L (ref 38–126)
BUN: 20 mg/dL (ref 6–20)
CALCIUM: 9.4 mg/dL (ref 8.9–10.3)
CHLORIDE: 105 mmol/L (ref 101–111)
CO2: 23 mmol/L (ref 22–32)
Creatinine, Ser: 0.95 mg/dL (ref 0.61–1.24)
GFR calc non Af Amer: >60 mL/min (ref >60)
Glucose, Bld: 195 mg/dL — ABNORMAL HIGH (ref 65–99)
Potassium: 4.1 mmol/L (ref 3.5–5.1)
SODIUM: 138 mmol/L (ref 135–145)
Total Bilirubin: 0.8 mg/dL (ref 0.3–1.2)
Total Protein: 7.6 g/dL (ref 6.5–8.1)

## 2017-02-20 LAB — TROPONIN I: Troponin I: <0.03 ng/mL (ref <0.03)

## 2017-02-20 LAB — LIPASE, BLOOD: LIPASE: 44 U/L (ref 11–51)

## 2017-02-20 MED ORDER — ONDANSETRON HCL 4 MG/2ML IJ SOLN
4.0000 mg | Freq: Once | INTRAMUSCULAR | Status: AC
  Administered 2017-02-20: 4 mg via INTRAVENOUS
  Filled 2017-02-20: qty 2

## 2017-02-20 MED ORDER — SODIUM CHLORIDE 0.9 % IV BOLUS (SEPSIS)
1000.0000 mL | Freq: Once | INTRAVENOUS | Status: AC
  Administered 2017-02-20: 1000 mL via INTRAVENOUS

## 2017-02-20 MED ORDER — ONDANSETRON 4 MG PO TBDP: 4.0000 mg | ORAL_TABLET | Freq: Three times a day (TID) | ORAL | 0 refills | Status: DC | PRN

## 2017-02-20 MED ORDER — CIPROFLOXACIN HCL 500 MG PO TABS: 500.0000 mg | ORAL_TABLET | Freq: Two times a day (BID) | ORAL | 0 refills | Status: DC

## 2017-02-20 MED ORDER — OXYCODONE-ACETAMINOPHEN 5-325 MG PO TABS: 1.0000 | ORAL_TABLET | ORAL | 0 refills | Status: DC | PRN

## 2017-02-20 MED ORDER — IOPAMIDOL (ISOVUE-300) INJECTION 61%
100.0000 mL | Freq: Once | INTRAVENOUS | Status: AC | PRN
  Administered 2017-02-20: 100 mL via INTRAVENOUS

## 2017-02-20 MED ORDER — CIPROFLOXACIN HCL 500 MG PO TABS
500.0000 mg | ORAL_TABLET | Freq: Once | ORAL | Status: AC
  Administered 2017-02-20: 500 mg via ORAL
  Filled 2017-02-20: qty 1

## 2017-02-20 MED ORDER — MORPHINE SULFATE (PF) 4 MG/ML IV SOLN
4.0000 mg | Freq: Once | INTRAVENOUS | Status: AC
  Administered 2017-02-20: 4 mg via INTRAVENOUS
  Filled 2017-02-20: qty 1

## 2017-02-20 MED ORDER — IOPAMIDOL (ISOVUE-300) INJECTION 61%
30.0000 mL | Freq: Once | INTRAVENOUS | Status: AC | PRN
  Administered 2017-02-20: 30 mL via ORAL

## 2017-02-20 NOTE — ED Provider Notes (Signed)
[facility] Emergency Department Provider Note   ____________________________________________   First MD Initiated Contact with Patient 02/20/17 [phone]     (approximate)  I have reviewed the triage vital signs and the nursing notes.   HISTORY  Chief Complaint Abdominal Pain and Weakness    HPI Rodney Vega is a 61 y.o. male who presents to the ED from home with a chief complaint of generalized weakness, abdominal pain and nausea. Patient reports a one-day history of the above complaints. Reports he has been eating "a lot" of peanuts. He has a history of diverticulitis requiring colectomy. Notes liquid stool in ostomy bag. Denies associated fever, chills, chest pain, shortness of breath, vomiting. Denies recent travel, trauma or antibiotic use. Nothing makes his symptoms better or worse.   Past Medical History:  Diagnosis Date  . Diverticulitis   . Glaucoma    since 2004  . Hernia 1979  . Kidney stone 2013  . Personal history of tobacco use, presenting hazards to health   . Screening for obesity   . Special screening for malignant neoplasms, colon     Patient Active Problem List   Diagnosis Date Noted  . 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 [place], 2012  . COLONOSCOPY  [id number]   [facility]/Dr. Buelah Manis sessile polyp,71m, found in rectum,multiple diverticula were found in the sigmoid, in descending and in transverse colon  . COLOSTOMY  04-20-13  . HERNIA REPAIR  1979  . LAPAROTOMY  04-20-13   colon resection with colosotmy  . LITHOTRIPSY  2013  . TONSILLECTOMY AND ADENOIDECTOMY  1962    Prior to Admission medications   Medication Sig Start Date End Date Taking? Authorizing Provider  Acetaminophen (TYLENOL ARTHRITIS PAIN PO) Take by mouth as needed.    [provider]  ciprofloxacin (CIPRO) 500 MG tablet Take 1 tablet (500 mg total) by mouth 2 (two) times daily. 02/20/17    SPaulette Blanch MD  diphenhydramine-acetaminophen (TYLENOL PM) 25-500 MG TABS Take 1 tablet by mouth at bedtime as needed.    [provider]  dorzolamide-timolol (COSOPT) 22.3-6.8 MG/ML ophthalmic solution Frequency:BID   Dosage:0.0     Instructions:  Note:Dose: 2%-0.5% 07/19/12   [provider]  latanoprost (XALATAN) 0.005 % ophthalmic solution Administer 1 drop to both eyes nightly. 09/03/13   [provider]  Multiple Vitamin (MULTIVITAMIN) tablet Take 1 tablet by mouth daily.    [provider]  ondansetron (ZOFRAN ODT) 4 MG disintegrating tablet Take 1 tablet (4 mg total) by mouth every 8 (eight) hours as needed for nausea or vomiting. 02/20/17   SPaulette Blanch MD  oxyCODONE-acetaminophen (ROXICET) 5-325 MG tablet Take 1 tablet by mouth every 4 (four) hours as needed for severe pain. 02/20/17   SPaulette Blanch MD  sulfamethoxazole-trimethoprim (BACTRIM DS,SEPTRA DS) 800-160 MG tablet Take 1 tablet by mouth 2 (two) times daily. 10/13/15   Cuthriell, JCharline Bills PA-C  venlafaxine XR (EFFEXOR-XR) 37.5 MG 24 hr capsule 1 po qd x 1-2 wks,then 2 po qd if needed 03/12/14   [provider]    Allergies Aleve [naproxen sodium]  No family history on file.  Social History Social History  Substance Use Topics  . Smoking status: Current Every Day Smoker    Packs/day: 1.00    Years: 20.00  . Smokeless tobacco: Never Used  . Alcohol use Yes    Review of Systems  Constitutional: No  fever/chills. Eyes: No visual changes. ENT: No sore throat. Cardiovascular: Denies chest pain. Respiratory: Denies shortness of breath. Gastrointestinal: Positive for abdominal pain and nausea, no vomiting.  No diarrhea.  No constipation. Genitourinary: Negative for dysuria. Musculoskeletal: Negative for back pain. Skin: Negative for rash. Neurological: Negative for headaches, focal weakness or numbness.   ____________________________________________   PHYSICAL  EXAM:  VITAL SIGNS: ED Triage Vitals  Enc Vitals Group     BP 02/19/17 2345 (!) 153/82     Pulse Rate 02/19/17 2345 86     Resp 02/19/17 2345 20     Temp 02/19/17 2345 97.7 F (36.5 C)     Temp Source 02/19/17 2345 Oral     SpO2 02/19/17 2345 96 %     Weight 02/19/17 2345 228 lb (103.4 kg)     Height 02/19/17 2345 '5\' 11"'$  (1.803 m)     Head Circumference --      Peak Flow --      Pain Score 02/19/17 2344 2     Pain Loc --      Pain Edu? --      Excl. in [place]? --     Constitutional: Alert and oriented. Well appearing and in mild acute distress. Eyes: Conjunctivae are normal. PERRL. EOMI. Head: Atraumatic. Nose: No congestion/rhinnorhea. Mouth/Throat: Mucous membranes are moist.  Oropharynx non-erythematous. Neck: No stridor.   Cardiovascular: Normal rate, regular rhythm. Grossly normal heart sounds.  Good peripheral circulation. Respiratory: Normal respiratory effort.  No retractions. Lungs CTAB. Gastrointestinal: Soft and mildly diffusely tender to palpation without rebound or guarding. Liquid stool in ostomy bag. No distention. No abdominal bruits. No CVA tenderness. Musculoskeletal: No lower extremity tenderness nor edema.  No joint effusions. Neurologic:  Normal speech and language. No gross focal neurologic deficits are appreciated.  Skin:  Skin is warm, dry and intact. No rash noted. Psychiatric: Mood and affect are normal. Speech and behavior are normal.  ____________________________________________   LABS (all labs ordered are listed, but only abnormal results are displayed)  Labs Reviewed  COMPREHENSIVE METABOLIC PANEL - Abnormal; Notable for the following:       Result Value   Glucose, Bld 195 (*)    All other components within normal limits  CBC - Abnormal; Notable for the following:    WBC 24.0 (*)    All other components within normal limits  URINALYSIS, COMPLETE (UACMP) WITH MICROSCOPIC - Abnormal; Notable for the following:    Color, Urine YELLOW (*)     APPearance CLEAR (*)    Specific Gravity, Urine 1.045 (*)    Glucose, UA >=500 (*)    Hgb urine dipstick SMALL (*)    Squamous Epithelial / LPF 0-5 (*)    All other components within normal limits  LIPASE, BLOOD  TROPONIN I   ____________________________________________  EKG  None ____________________________________________  RADIOLOGY  CT abdomen and pelvis interpreted per Dr. Collins [place]: 1. No acute abnormality of the abdomen or pelvis.  2. Diffusely fluid-filled and prominent small bowel without clear  transition point, technically below the upper limit of what is  considered nondilated. Nonetheless, a developing or early  obstructive process would be difficult to exclude.  3. Aortic atherosclerosis.  4. Nonobstructive left nephrolithiasis measuring up to 8 mm.   ____________________________________________   PROCEDURES  Procedure(s) performed: None  Procedures  Critical Care performed: No  ____________________________________________   INITIAL IMPRESSION / ASSESSMENT AND PLAN / ED COURSE  Pertinent labs & imaging results that were available during my care  of the patient were reviewed by me and considered in my medical decision making (see chart for details).  61 year old male who presents with abdominal pain. History of diverticulitis status post colectomy. Laboratory results remarkable for leukocytosis. Will administer IV analgesia and proceed with CT abdomen/pelvis.  Clinical Course as of Feb 20 750  Sun Feb 20, 2017  0346 Patient soundly asleep. Updated patient and spouse of CT imaging results. He will try to give urine specimen.  [JS]  [id number] Updated patient and spouse of urine results. He remains soundly asleep, resting comfortably. Reexamined abdomen which is nontender to palpation. Discussed with both; will place on Cipro empirically for 5 days. Strict return precautions given. Both verbalize understanding and agree with plan of care.  [JS]    Clinical  Course User Index [JS] Paulette Blanch, MD     ____________________________________________   FINAL CLINICAL IMPRESSION(S) / ED DIAGNOSES  Final diagnoses:  Generalized abdominal pain  Nausea      NEW MEDICATIONS STARTED DURING THIS VISIT:  Discharge Medication List as of 02/20/2017  4:40 AM    START taking these medications   Details  ciprofloxacin (CIPRO) 500 MG tablet Take 1 tablet (500 mg total) by mouth 2 (two) times daily., Starting Sun 02/20/2017, Print    ondansetron (ZOFRAN ODT) 4 MG disintegrating tablet Take 1 tablet (4 mg total) by mouth every 8 (eight) hours as needed for nausea or vomiting., Starting Sun 02/20/2017, Print    oxyCODONE-acetaminophen (ROXICET) 5-325 MG tablet Take 1 tablet by mouth every 4 (four) hours as needed for severe pain., Starting Sun 02/20/2017, Print         Note:  This document was prepared using Dragon voice recognition software and may include unintentional dictation errors.    Paulette Blanch, MD 02/20/17 [phone]

## 2017-02-20 NOTE — Discharge Instructions (Signed)
1. Take antibiotic as prescribed (Cipro 500 mg twice daily 5 days). 2. Avoid foods with seeds for the next week. 3. You may take pain and nausea medicines as needed (Percocet/Zofran). 4. Return to the ER for worsening symptoms, persistent vomiting, fever, difficulty breathing or other concerns.

## 2017-02-21 ENCOUNTER — Telehealth: Payer: Self-pay | Admitting: *Deleted

## 2017-02-21 NOTE — Telephone Encounter (Signed)
Patient's wife Vickii Chafe called the office to report that patient was seen in the ER Saturday night/Sunday morning. Went because of stomach pain.   The nurse told them that he had a small blockage but states that the doctor told him that he didn't. They would like for you to review the CT scan that was done.   Patient was placed on Cipro and told to follow up with Dr. Kary Kos.   Best number to give patient a call back is at [phone].

## 2017-10-11 DIAGNOSIS — M170 Bilateral primary osteoarthritis of knee: Secondary | ICD-10-CM | POA: Insufficient documentation

## 2017-10-11 DIAGNOSIS — E669 Obesity, unspecified: Secondary | ICD-10-CM | POA: Insufficient documentation

## 2017-11-01 ENCOUNTER — Other Ambulatory Visit: Payer: Self-pay | Admitting: Unknown Physician Specialty

## 2017-11-01 DIAGNOSIS — M170 Bilateral primary osteoarthritis of knee: Secondary | ICD-10-CM

## 2017-11-09 ENCOUNTER — Ambulatory Visit
Admission: RE | Admit: 2017-11-09 | Discharge: 2017-11-09 | Disposition: A | Discharge status: Home / Self Care | Payer: BC Managed Care – PPO | Source: Ambulatory Visit | Attending: Unknown Physician Specialty | Admitting: Unknown Physician Specialty

## 2017-11-09 DIAGNOSIS — M170 Bilateral primary osteoarthritis of knee: Secondary | ICD-10-CM

## 2017-11-09 DIAGNOSIS — S83241A Other tear of medial meniscus, current injury, right knee, initial encounter: Secondary | ICD-10-CM | POA: Insufficient documentation

## 2017-11-09 DIAGNOSIS — X58XXXA Exposure to other specified factors, initial encounter: Secondary | ICD-10-CM | POA: Diagnosis not present

## 2017-11-09 DIAGNOSIS — S83242A Other tear of medial meniscus, current injury, left knee, initial encounter: Secondary | ICD-10-CM | POA: Diagnosis not present

## 2018-03-21 ENCOUNTER — Telehealth: Payer: Self-pay | Admitting: General Surgery

## 2018-03-21 NOTE — Telephone Encounter (Signed)
Please would like for you to return his call.Old patient of Dr Lucy Chris)

## 2018-03-21 NOTE — Telephone Encounter (Signed)
He called asking for a letter stating that he has an ostomy that comes across where his seat belt is. Patient has not been seen since 2015 and Dr Jamal Collin has since retired, The patient was referred to [facility], Dr Gwinda Maine in 2016. Recommended him contacting them or his PCP.

## 2018-04-16 NOTE — Discharge Instructions (Signed)
°  Instructions after Total Knee Replacement ° ° Zurri Rudden P. Tison Leibold, Jr., M.D.    ° Dept. of Orthopaedics & Sports Medicine ° [facility] ° [address] ° [place], [place]  [address] ° Phone: [phone]   Fax: [phone] ° °  °DIET: °• Drink plenty of non-alcoholic fluids. °• Resume your normal diet. Include foods high in fiber. ° °ACTIVITY:  °• You may use crutches or a walker with weight-bearing as tolerated, unless instructed otherwise. °• You may be weaned off of the walker or crutches by your Physical Therapist.  °• Do NOT place pillows under the knee. Anything placed under the knee could limit your ability to straighten the knee.   °• Continue doing gentle exercises. Exercising will reduce the pain and swelling, increase motion, and prevent muscle weakness.   °• Please continue to use the TED compression stockings for 6 weeks. You may remove the stockings at night, but should reapply them in the morning. °• Do not drive or operate any equipment until instructed. ° °WOUND CARE:  °• Continue to use the PolarCare or ice packs periodically to reduce pain and swelling. °• You may bathe or shower after the staples are removed at the first office visit following surgery. ° °MEDICATIONS: °• You may resume your regular medications. °• Please take the pain medication as prescribed on the medication. °• Do not take pain medication on an empty stomach. °• You have been given a prescription for a blood thinner (Lovenox or Coumadin). Please take the medication as instructed. (NOTE: After completing a 2 week course of Lovenox, take one Enteric-coated aspirin once a day. This along with elevation will help reduce the possibility of phlebitis in your operated leg.) °• Do not drive or drink alcoholic beverages when taking pain medications. ° °CALL THE OFFICE FOR: °• Temperature above 101 degrees °• Excessive bleeding or drainage on the dressing. °• Excessive swelling, coldness, or paleness of the toes. °• Persistent  nausea and vomiting. ° °FOLLOW-UP:  °• You should have an appointment to return to the office in 10-14 days after surgery. °• Arrangements have been made for continuation of Physical Therapy (either home therapy or outpatient therapy). °  °

## 2018-04-26 ENCOUNTER — Encounter
Admission: RE | Admit: 2018-04-26 | Discharge: 2018-04-26 | Disposition: A | Discharge status: Home / Self Care | Payer: BC Managed Care – PPO | Source: Ambulatory Visit | Attending: Orthopedic Surgery | Admitting: Orthopedic Surgery

## 2018-04-26 ENCOUNTER — Other Ambulatory Visit: Payer: Self-pay

## 2018-04-26 DIAGNOSIS — Z01812 Encounter for preprocedural laboratory examination: Secondary | ICD-10-CM | POA: Diagnosis present

## 2018-04-26 HISTORY — DX: Major depressive disorder, single episode, unspecified: F32.9

## 2018-04-26 HISTORY — DX: Depression, unspecified: F32.A

## 2018-04-26 HISTORY — DX: Osteoarthritis of knee, unspecified: M17.9

## 2018-04-26 HISTORY — DX: Personal history of urinary calculi: Z87.442

## 2018-04-26 HISTORY — DX: Unilateral primary osteoarthritis, unspecified knee: M17.10

## 2018-04-26 LAB — COMPREHENSIVE METABOLIC PANEL
ALBUMIN: 3.9 g/dL (ref 3.5–5.0)
ALT: 18 U/L (ref 0–44)
AST: 26 U/L (ref 15–41)
Alkaline Phosphatase: 73 U/L (ref 38–126)
Anion gap: 5 (ref 5–15)
BUN: 7 mg/dL — AB (ref 8–23)
CHLORIDE: 105 mmol/L (ref 98–111)
CO2: 29 mmol/L (ref 22–32)
Calcium: 8.9 mg/dL (ref 8.9–10.3)
Creatinine, Ser: 1.01 mg/dL (ref 0.61–1.24)
GFR calc Af Amer: >60 mL/min (ref >60)
Glucose, Bld: 139 mg/dL — ABNORMAL HIGH (ref 70–99)
POTASSIUM: 3.9 mmol/L (ref 3.5–5.1)
SODIUM: 139 mmol/L (ref 135–145)
Total Bilirubin: 0.4 mg/dL (ref 0.3–1.2)
Total Protein: 7.3 g/dL (ref 6.5–8.1)

## 2018-04-26 LAB — CBC
HCT: 39.9 % — ABNORMAL LOW (ref 40.0–52.0)
Hemoglobin: 14.5 g/dL (ref 13.0–18.0)
MCH: 34.5 pg — ABNORMAL HIGH (ref 26.0–34.0)
MCHC: 36.4 g/dL — ABNORMAL HIGH (ref 32.0–36.0)
MCV: 94.9 fL (ref 80.0–100.0)
PLATELETS: 210 10*3/uL (ref 150–440)
RBC: 4.21 MIL/uL — AB (ref 4.40–5.90)
RDW: 13.6 % (ref 11.5–14.5)
WBC: 7.6 10*3/uL (ref 3.8–10.6)

## 2018-04-26 LAB — URINALYSIS, ROUTINE W REFLEX MICROSCOPIC
BACTERIA UA: NONE SEEN
BILIRUBIN URINE: NEGATIVE
Glucose, UA: >=500 mg/dL — AB
Ketones, ur: NEGATIVE mg/dL
Leukocytes, UA: NEGATIVE
Nitrite: NEGATIVE
Protein, ur: NEGATIVE mg/dL
SPECIFIC GRAVITY, URINE: 1.014 (ref 1.005–1.030)
SQUAMOUS EPITHELIAL / LPF: NONE SEEN (ref 0–5)
pH: 6 (ref 5.0–8.0)

## 2018-04-26 LAB — TYPE AND SCREEN
ABO/RH(D): B POS
ANTIBODY SCREEN: NEGATIVE

## 2018-04-26 LAB — APTT: APTT: 30 s (ref 24–36)

## 2018-04-26 LAB — SEDIMENTATION RATE: SED RATE: 10 mm/h (ref 0–20)

## 2018-04-26 LAB — C-REACTIVE PROTEIN

## 2018-04-26 LAB — SURGICAL PCR SCREEN
MRSA, PCR: NEGATIVE
STAPHYLOCOCCUS AUREUS: POSITIVE — AB

## 2018-04-26 LAB — PROTIME-INR
INR: 0.95
PROTHROMBIN TIME: 12.6 s (ref 11.4–15.2)

## 2018-04-26 NOTE — Patient Instructions (Signed)
Your procedure is scheduled on: Monday, May 08, 2018 Report to Day Surgery on the 2nd floor of the [facility]. To find out your arrival time, please call [phone] between 1PM - 3PM on: Friday, May 05, 2018  REMEMBER: Instructions that are not followed completely may result in serious medical risk, up to and including death; or upon the discretion of your surgeon and anesthesiologist your surgery may need to be rescheduled.  Do not eat food after midnight the night before your procedure.  No gum chewing, lozengers or hard candies.  You may however, drink CLEAR liquids up to 2 hours before you are scheduled to arrive for your surgery. Do not drink anything within 2 hours of the start of your surgery.  Clear liquids include: - water  - apple juice without pulp - clear gatorade - black coffee or tea (Do NOT add anything to the coffee or tea) Do NOT drink anything that is not on this list.  No Alcohol for 24 hours before or after surgery.  No Smoking including e-cigarettes for 24 hours prior to surgery.  No chewable tobacco products for at least 6 hours prior to surgery.  No nicotine patches on the day of surgery.  On the morning of surgery brush your teeth with toothpaste and water, you may rinse your mouth with mouthwash if you wish. Do not swallow any toothpaste or mouthwash.  Notify your doctor if there is any change in your medical condition (cold, fever, infection).  Do not wear jewelry, make-up, hairpins, clips or nail polish.  Do not wear lotions, powders, or perfumes. You may wear deodorant.  Do not shave 48 hours prior to surgery. Men may shave face and neck.  Contacts and dentures may not be worn into surgery.  Do not bring valuables to the hospital, including drivers license, insurance or credit cards.  [facility] is not responsible for any belongings or valuables.   TAKE THESE MEDICATIONS THE MORNING OF SURGERY:  1.  cosopt eye drops 2.  effexor  Use  CHG Soap as directed on instruction sheet.  On July 29 - Stop Anti-inflammatories (NSAIDS) such as Advil, Aleve, Ibuprofen, Motrin, Naproxen, Naprosyn and Aspirin based products such as Excedrin, Goodys Powder, BC Powder. (May take Tylenol or Acetaminophen if needed.)  On July 29 - Stop ANY OVER THE COUNTER supplements until after surgery.  Wear comfortable clothing (specific to your surgery type) to the hospital.  Plan for stool softeners for home use.  If you are being admitted to the hospital overnight, leave your suitcase in the car. After surgery it may be brought to your room.  Please call [phone] if you have any questions about these instructions.

## 2018-04-27 LAB — URINE CULTURE
Culture: NO GROWTH
Special Requests: NORMAL

## 2018-05-01 LAB — HEMOGLOBIN A1C
HEMOGLOBIN A1C: 6.1 % — AB (ref 4.8–5.6)
MEAN PLASMA GLUCOSE: 128.37 mg/dL

## 2018-05-07 MED ORDER — TRANEXAMIC ACID 1000 MG/10ML IV SOLN
1000.0000 mg | INTRAVENOUS | Status: DC
  Filled 2018-05-07: qty 10

## 2018-05-07 MED ORDER — CEFAZOLIN SODIUM-DEXTROSE 2-4 GM/100ML-% IV SOLN: 2.0000 g | INTRAVENOUS | Status: DC

## 2018-05-08 ENCOUNTER — Encounter: Payer: Self-pay | Admitting: Orthopedic Surgery

## 2018-05-08 ENCOUNTER — Inpatient Hospital Stay: Payer: BC Managed Care – PPO

## 2018-05-08 ENCOUNTER — Inpatient Hospital Stay
Admission: RE | Admit: 2018-05-08 | Discharge: 2018-05-10 | DRG: 470 | Disposition: A | Discharge status: Home Health | Payer: BC Managed Care – PPO | Attending: Orthopedic Surgery | Admitting: Orthopedic Surgery

## 2018-05-08 ENCOUNTER — Inpatient Hospital Stay: Payer: BC Managed Care – PPO | Admitting: Anesthesiology

## 2018-05-08 ENCOUNTER — Other Ambulatory Visit: Payer: Self-pay

## 2018-05-08 ENCOUNTER — Encounter
Admission: RE | Disposition: A | Discharge status: Home Health | Payer: Self-pay | Source: Ambulatory Visit | Attending: Orthopedic Surgery

## 2018-05-08 DIAGNOSIS — N200 Calculus of kidney: Secondary | ICD-10-CM | POA: Insufficient documentation

## 2018-05-08 DIAGNOSIS — Z96651 Presence of right artificial knee joint: Secondary | ICD-10-CM

## 2018-05-08 DIAGNOSIS — Z6830 Body mass index (BMI) 30.0-30.9, adult: Secondary | ICD-10-CM | POA: Diagnosis not present

## 2018-05-08 DIAGNOSIS — Z933 Colostomy status: Secondary | ICD-10-CM | POA: Insufficient documentation

## 2018-05-08 DIAGNOSIS — F17200 Nicotine dependence, unspecified, uncomplicated: Secondary | ICD-10-CM | POA: Diagnosis present

## 2018-05-08 DIAGNOSIS — H409 Unspecified glaucoma: Secondary | ICD-10-CM | POA: Diagnosis present

## 2018-05-08 DIAGNOSIS — M1711 Unilateral primary osteoarthritis, right knee: Secondary | ICD-10-CM | POA: Diagnosis present

## 2018-05-08 DIAGNOSIS — E669 Obesity, unspecified: Secondary | ICD-10-CM | POA: Diagnosis present

## 2018-05-08 DIAGNOSIS — Z96659 Presence of unspecified artificial knee joint: Secondary | ICD-10-CM

## 2018-05-08 HISTORY — PX: KNEE ARTHROPLASTY: SHX992

## 2018-05-08 LAB — URINE DRUG SCREEN, QUALITATIVE (ARMC ONLY)
Amphetamines, Ur Screen: NOT DETECTED
BARBITURATES, UR SCREEN: NOT DETECTED
CANNABINOID 50 NG, UR ~~LOC~~: POSITIVE — AB
Cocaine Metabolite,Ur ~~LOC~~: NOT DETECTED
MDMA (Ecstasy)Ur Screen: NOT DETECTED
Methadone Scn, Ur: NOT DETECTED
OPIATE, UR SCREEN: NOT DETECTED
PHENCYCLIDINE (PCP) UR S: NOT DETECTED
Tricyclic, Ur Screen: NOT DETECTED

## 2018-05-08 LAB — ABO/RH: ABO/RH(D): B POS

## 2018-05-08 SURGERY — ARTHROPLASTY, KNEE, TOTAL, USING IMAGELESS COMPUTER-ASSISTED NAVIGATION
Anesthesia: Spinal | Site: Knee | Laterality: Right | Wound class: Clean

## 2018-05-08 MED ORDER — PROPOFOL 500 MG/50ML IV EMUL
INTRAVENOUS | Status: AC
  Filled 2018-05-08: qty 50

## 2018-05-08 MED ORDER — ONDANSETRON HCL 4 MG PO TABS: 4.0000 mg | ORAL_TABLET | Freq: Four times a day (QID) | ORAL | Status: DC | PRN

## 2018-05-08 MED ORDER — SENNOSIDES-DOCUSATE SODIUM 8.6-50 MG PO TABS
1.0000 | ORAL_TABLET | Freq: Two times a day (BID) | ORAL | Status: DC
  Administered 2018-05-08 – 2018-05-10 (×4): 1 via ORAL
  Filled 2018-05-08 (×4): qty 1

## 2018-05-08 MED ORDER — BUPIVACAINE IN DEXTROSE 0.75-8.25 % IT SOLN
INTRATHECAL | Status: DC | PRN
  Administered 2018-05-08: 1.8 mL via INTRATHECAL

## 2018-05-08 MED ORDER — MAGNESIUM HYDROXIDE 400 MG/5ML PO SUSP
30.0000 mL | Freq: Every day | ORAL | Status: DC
  Administered 2018-05-09 – 2018-05-10 (×2): 30 mL via ORAL
  Filled 2018-05-08 (×2): qty 30

## 2018-05-08 MED ORDER — GLYCOPYRROLATE 0.2 MG/ML IJ SOLN
INTRAMUSCULAR | Status: DC | PRN
  Administered 2018-05-08: 0.2 mg via INTRAVENOUS

## 2018-05-08 MED ORDER — NEOMYCIN-POLYMYXIN B GU 40-200000 IR SOLN
Status: DC | PRN
  Administered 2018-05-08: 14 mL

## 2018-05-08 MED ORDER — METOCLOPRAMIDE HCL 10 MG PO TABS: 5.0000 mg | ORAL_TABLET | Freq: Three times a day (TID) | ORAL | Status: DC | PRN

## 2018-05-08 MED ORDER — MENTHOL 3 MG MT LOZG
1.0000 | LOZENGE | OROMUCOSAL | Status: DC | PRN
  Filled 2018-05-08: qty 9

## 2018-05-08 MED ORDER — CELECOXIB 200 MG PO CAPS
400.0000 mg | ORAL_CAPSULE | Freq: Once | ORAL | Status: AC
  Administered 2018-05-08: 400 mg via ORAL

## 2018-05-08 MED ORDER — ACETAMINOPHEN 10 MG/ML IV SOLN
INTRAVENOUS | Status: DC | PRN
  Administered 2018-05-08: 1000 mg via INTRAVENOUS

## 2018-05-08 MED ORDER — METOCLOPRAMIDE HCL 10 MG PO TABS
10.0000 mg | ORAL_TABLET | Freq: Three times a day (TID) | ORAL | Status: DC
  Administered 2018-05-08 – 2018-05-10 (×6): 10 mg via ORAL
  Filled 2018-05-08 (×6): qty 1

## 2018-05-08 MED ORDER — SODIUM CHLORIDE FLUSH 0.9 % IV SOLN
INTRAVENOUS | Status: AC
  Filled 2018-05-08: qty 40

## 2018-05-08 MED ORDER — BUPIVACAINE HCL (PF) 0.25 % IJ SOLN
INTRAMUSCULAR | Status: DC | PRN
  Administered 2018-05-08: 60 mL

## 2018-05-08 MED ORDER — CELECOXIB 200 MG PO CAPS
ORAL_CAPSULE | ORAL | Status: AC
  Administered 2018-05-08: 400 mg via ORAL
  Filled 2018-05-08: qty 2

## 2018-05-08 MED ORDER — GABAPENTIN 300 MG PO CAPS
300.0000 mg | ORAL_CAPSULE | Freq: Every day | ORAL | Status: DC
  Administered 2018-05-08 – 2018-05-09 (×2): 300 mg via ORAL
  Filled 2018-05-08 (×2): qty 1

## 2018-05-08 MED ORDER — ACETAMINOPHEN 10 MG/ML IV SOLN
INTRAVENOUS | Status: AC
  Filled 2018-05-08: qty 100

## 2018-05-08 MED ORDER — CHLORHEXIDINE GLUCONATE 4 % EX LIQD: 60.0000 mL | Freq: Once | CUTANEOUS | Status: DC

## 2018-05-08 MED ORDER — DIPHENHYDRAMINE HCL 12.5 MG/5ML PO ELIX
12.5000 mg | ORAL_SOLUTION | ORAL | Status: DC | PRN
  Administered 2018-05-09: 25 mg via ORAL
  Filled 2018-05-08: qty 10

## 2018-05-08 MED ORDER — ACETAMINOPHEN 325 MG PO TABS: 325.0000 mg | ORAL_TABLET | Freq: Four times a day (QID) | ORAL | Status: DC | PRN

## 2018-05-08 MED ORDER — TRANEXAMIC ACID 1000 MG/10ML IV SOLN
INTRAVENOUS | Status: DC | PRN
  Administered 2018-05-08: 1000 mg via INTRAVENOUS

## 2018-05-08 MED ORDER — METOCLOPRAMIDE HCL 5 MG/ML IJ SOLN: 5.0000 mg | Freq: Three times a day (TID) | INTRAMUSCULAR | Status: DC | PRN

## 2018-05-08 MED ORDER — GABAPENTIN 300 MG PO CAPS
ORAL_CAPSULE | ORAL | Status: AC
  Administered 2018-05-08: 300 mg via ORAL
  Filled 2018-05-08: qty 1

## 2018-05-08 MED ORDER — FERROUS SULFATE 325 (65 FE) MG PO TABS
325.0000 mg | ORAL_TABLET | Freq: Two times a day (BID) | ORAL | Status: DC
  Administered 2018-05-09 – 2018-05-10 (×3): 325 mg via ORAL
  Filled 2018-05-08 (×3): qty 1

## 2018-05-08 MED ORDER — BISACODYL 10 MG RE SUPP: 10.0000 mg | Freq: Every day | RECTAL | Status: DC | PRN

## 2018-05-08 MED ORDER — ALUM & MAG HYDROXIDE-SIMETH 200-200-20 MG/5ML PO SUSP: 30.0000 mL | ORAL | Status: DC | PRN

## 2018-05-08 MED ORDER — PROPOFOL 10 MG/ML IV BOLUS
INTRAVENOUS | Status: AC
  Filled 2018-05-08: qty 20

## 2018-05-08 MED ORDER — CEFAZOLIN SODIUM-DEXTROSE 2-4 GM/100ML-% IV SOLN
2.0000 g | Freq: Four times a day (QID) | INTRAVENOUS | Status: AC
  Administered 2018-05-09 (×3): 2 g via INTRAVENOUS
  Filled 2018-05-08 (×4): qty 100

## 2018-05-08 MED ORDER — ONDANSETRON HCL 4 MG/2ML IJ SOLN: 4.0000 mg | Freq: Once | INTRAMUSCULAR | Status: DC | PRN

## 2018-05-08 MED ORDER — OXYCODONE HCL 5 MG PO TABS
10.0000 mg | ORAL_TABLET | ORAL | Status: DC | PRN
  Administered 2018-05-09 (×2): 10 mg via ORAL
  Filled 2018-05-08 (×4): qty 2

## 2018-05-08 MED ORDER — PROPOFOL 500 MG/50ML IV EMUL
INTRAVENOUS | Status: DC | PRN
  Administered 2018-05-08: 60 ug/kg/min via INTRAVENOUS
  Administered 2018-05-08: 100 ug/kg/min via INTRAVENOUS

## 2018-05-08 MED ORDER — CEFAZOLIN SODIUM-DEXTROSE 2-3 GM-%(50ML) IV SOLR
INTRAVENOUS | Status: DC | PRN
  Administered 2018-05-08: 2 g via INTRAVENOUS

## 2018-05-08 MED ORDER — SODIUM CHLORIDE 0.9 % IV SOLN
INTRAVENOUS | Status: DC | PRN
  Administered 2018-05-08: 60 mL

## 2018-05-08 MED ORDER — DEXAMETHASONE SODIUM PHOSPHATE 10 MG/ML IJ SOLN
INTRAMUSCULAR | Status: AC
  Administered 2018-05-08: 8 mg via INTRAVENOUS
  Filled 2018-05-08: qty 1

## 2018-05-08 MED ORDER — DORZOLAMIDE HCL-TIMOLOL MAL 2-0.5 % OP SOLN
1.0000 [drp] | Freq: Two times a day (BID) | OPHTHALMIC | Status: DC
  Administered 2018-05-08 – 2018-05-10 (×3): 1 [drp] via OPHTHALMIC
  Filled 2018-05-08: qty 10

## 2018-05-08 MED ORDER — BUPIVACAINE LIPOSOME 1.3 % IJ SUSP
INTRAMUSCULAR | Status: AC
  Filled 2018-05-08: qty 20

## 2018-05-08 MED ORDER — MIDAZOLAM HCL 2 MG/2ML IJ SOLN
INTRAMUSCULAR | Status: AC
  Filled 2018-05-08: qty 2

## 2018-05-08 MED ORDER — LACTATED RINGERS IV SOLN
INTRAVENOUS | Status: DC
  Administered 2018-05-08 (×2): via INTRAVENOUS

## 2018-05-08 MED ORDER — PROPOFOL 10 MG/ML IV BOLUS
INTRAVENOUS | Status: AC
  Filled 2018-05-08: qty 40

## 2018-05-08 MED ORDER — HYDROMORPHONE HCL 1 MG/ML IJ SOLN: 0.5000 mg | INTRAMUSCULAR | Status: DC | PRN

## 2018-05-08 MED ORDER — PHENOL 1.4 % MT LIQD
1.0000 | OROMUCOSAL | Status: DC | PRN
  Filled 2018-05-08: qty 177

## 2018-05-08 MED ORDER — MIDAZOLAM HCL 5 MG/5ML IJ SOLN
INTRAMUSCULAR | Status: DC | PRN
Start: 2018-05-08 — End: 2018-05-08
  Administered 2018-05-08: 2 mg via INTRAVENOUS

## 2018-05-08 MED ORDER — VENLAFAXINE HCL ER 150 MG PO CP24
150.0000 mg | ORAL_CAPSULE | Freq: Every day | ORAL | Status: DC
  Administered 2018-05-09 – 2018-05-10 (×2): 150 mg via ORAL
  Filled 2018-05-08 (×2): qty 1

## 2018-05-08 MED ORDER — CEFAZOLIN SODIUM-DEXTROSE 2-4 GM/100ML-% IV SOLN
INTRAVENOUS | Status: AC
Start: 2018-05-08 — End: 2018-05-08
  Administered 2018-05-08: 2000 mg
  Filled 2018-05-08: qty 100

## 2018-05-08 MED ORDER — TRAMADOL HCL 50 MG PO TABS
50.0000 mg | ORAL_TABLET | ORAL | Status: DC | PRN
  Administered 2018-05-08 – 2018-05-09 (×2): 100 mg via ORAL
  Filled 2018-05-08 (×2): qty 2

## 2018-05-08 MED ORDER — FLEET ENEMA 7-19 GM/118ML RE ENEM: 1.0000 | ENEMA | Freq: Once | RECTAL | Status: DC | PRN

## 2018-05-08 MED ORDER — OXYCODONE HCL 5 MG PO TABS
5.0000 mg | ORAL_TABLET | ORAL | Status: DC | PRN
  Administered 2018-05-08 – 2018-05-09 (×2): 5 mg via ORAL
  Filled 2018-05-08: qty 1

## 2018-05-08 MED ORDER — LATANOPROST 0.005 % OP SOLN
1.0000 [drp] | Freq: Every day | OPHTHALMIC | Status: DC
  Administered 2018-05-08: 1 [drp] via OPHTHALMIC
  Filled 2018-05-08: qty 2.5

## 2018-05-08 MED ORDER — ACETAMINOPHEN 10 MG/ML IV SOLN
1000.0000 mg | Freq: Four times a day (QID) | INTRAVENOUS | Status: AC
  Administered 2018-05-09 (×4): 1000 mg via INTRAVENOUS
  Filled 2018-05-08 (×4): qty 100

## 2018-05-08 MED ORDER — DEXAMETHASONE SODIUM PHOSPHATE 10 MG/ML IJ SOLN
8.0000 mg | Freq: Once | INTRAMUSCULAR | Status: AC
  Administered 2018-05-08: 8 mg via INTRAVENOUS

## 2018-05-08 MED ORDER — BUPIVACAINE HCL (PF) 0.25 % IJ SOLN
INTRAMUSCULAR | Status: AC
  Filled 2018-05-08: qty 60

## 2018-05-08 MED ORDER — GABAPENTIN 300 MG PO CAPS
300.0000 mg | ORAL_CAPSULE | Freq: Once | ORAL | Status: AC
  Administered 2018-05-08: 300 mg via ORAL

## 2018-05-08 MED ORDER — SODIUM CHLORIDE 0.9 % IV SOLN
INTRAVENOUS | Status: DC
  Administered 2018-05-08 – 2018-05-09 (×2): via INTRAVENOUS

## 2018-05-08 MED ORDER — FENTANYL CITRATE (PF) 100 MCG/2ML IJ SOLN: 25.0000 ug | INTRAMUSCULAR | Status: DC | PRN

## 2018-05-08 MED ORDER — TRANEXAMIC ACID 1000 MG/10ML IV SOLN
1000.0000 mg | Freq: Once | INTRAVENOUS | Status: AC
  Administered 2018-05-08: 1000 mg via INTRAVENOUS
  Filled 2018-05-08: qty 10

## 2018-05-08 MED ORDER — NEOMYCIN-POLYMYXIN B GU 40-200000 IR SOLN
Status: AC
  Filled 2018-05-08: qty 20

## 2018-05-08 MED ORDER — FAMOTIDINE 20 MG PO TABS
ORAL_TABLET | ORAL | Status: AC
  Administered 2018-05-08: 20 mg via ORAL
  Filled 2018-05-08: qty 1

## 2018-05-08 MED ORDER — ENOXAPARIN SODIUM 30 MG/0.3ML ~~LOC~~ SOLN
30.0000 mg | Freq: Two times a day (BID) | SUBCUTANEOUS | Status: DC
  Administered 2018-05-09 – 2018-05-10 (×3): 30 mg via SUBCUTANEOUS
  Filled 2018-05-08 (×3): qty 0.3

## 2018-05-08 MED ORDER — PANTOPRAZOLE SODIUM 40 MG PO TBEC
40.0000 mg | DELAYED_RELEASE_TABLET | Freq: Two times a day (BID) | ORAL | Status: DC
  Administered 2018-05-08 – 2018-05-10 (×4): 40 mg via ORAL
  Filled 2018-05-08 (×4): qty 1

## 2018-05-08 MED ORDER — CELECOXIB 200 MG PO CAPS
200.0000 mg | ORAL_CAPSULE | Freq: Two times a day (BID) | ORAL | Status: DC
  Administered 2018-05-08 – 2018-05-10 (×4): 200 mg via ORAL
  Filled 2018-05-08 (×4): qty 1

## 2018-05-08 MED ORDER — FAMOTIDINE 20 MG PO TABS
20.0000 mg | ORAL_TABLET | Freq: Once | ORAL | Status: AC
  Administered 2018-05-08: 20 mg via ORAL

## 2018-05-08 MED ORDER — ONDANSETRON HCL 4 MG/2ML IJ SOLN: 4.0000 mg | Freq: Four times a day (QID) | INTRAMUSCULAR | Status: DC | PRN

## 2018-05-08 SURGICAL SUPPLY — 72 items
ATTUNE MED DOME PAT 38 KNEE (Knees) ×2 IMPLANT
ATTUNE PS FEM RT SZ 7 CEM KNEE (Femur) ×2 IMPLANT
ATTUNE PSRP INSR SZ7 6 KNEE (Insert) ×2 IMPLANT
BASE TIBIAL ROT PLAT SZ 7 KNEE (Knees) ×1 IMPLANT
BATTERY INSTRU NAVIGATION (MISCELLANEOUS) ×8 IMPLANT
BLADE CLIPPER SURG (BLADE) ×2 IMPLANT
BLADE SAGITTAL 25.0X1.19X90 (BLADE) ×2 IMPLANT
BLADE SAW 1/2 (BLADE) ×2 IMPLANT
BLADE SAW 70X12.5 (BLADE) IMPLANT
BONE CEMENT GENTAMICIN (Cement) ×4 IMPLANT
CANISTER SUCT 1200ML W/VALVE (MISCELLANEOUS) ×2 IMPLANT
CANISTER SUCT 3000ML PPV (MISCELLANEOUS) ×4 IMPLANT
CEMENT BONE GENTAMICIN 40 (Cement) ×2 IMPLANT
COOLER POLAR GLACIER W/PUMP (MISCELLANEOUS) ×2 IMPLANT
CUFF TOURN 24 STER (MISCELLANEOUS) IMPLANT
CUFF TOURN 30 STER DUAL PORT (MISCELLANEOUS) IMPLANT
DRAPE SHEET LG 3/4 BI-LAMINATE (DRAPES) ×2 IMPLANT
DRSG DERMACEA 8X12 NADH (GAUZE/BANDAGES/DRESSINGS) ×2 IMPLANT
DRSG OPSITE POSTOP 4X14 (GAUZE/BANDAGES/DRESSINGS) ×2 IMPLANT
DRSG TEGADERM 4X4.75 (GAUZE/BANDAGES/DRESSINGS) ×2 IMPLANT
DURAPREP 26ML APPLICATOR (WOUND CARE) ×4 IMPLANT
ELECT CAUTERY BLADE 6.4 (BLADE) ×2 IMPLANT
ELECT REM PT RETURN 9FT ADLT (ELECTROSURGICAL) ×2
ELECTRODE REM PT RTRN 9FT ADLT (ELECTROSURGICAL) ×1 IMPLANT
EX-PIN ORTHOLOCK NAV 4X150 (PIN) ×4 IMPLANT
GLOVE BIOGEL M STRL SZ7.5 (GLOVE) ×4 IMPLANT
GLOVE BIOGEL PI IND STRL 7.0 (GLOVE) ×6 IMPLANT
GLOVE BIOGEL PI IND STRL 9 (GLOVE) ×1 IMPLANT
GLOVE BIOGEL PI INDICATOR 7.0 (GLOVE) ×6
GLOVE BIOGEL PI INDICATOR 9 (GLOVE) ×1
GLOVE INDICATOR 8.0 STRL GRN (GLOVE) ×2 IMPLANT
GLOVE SURG SYN 9.0  PF PI (GLOVE) ×1
GLOVE SURG SYN 9.0 PF PI (GLOVE) ×1 IMPLANT
GOWN STRL REUS W/ TWL LRG LVL3 (GOWN DISPOSABLE) ×2 IMPLANT
GOWN STRL REUS W/TWL 2XL LVL3 (GOWN DISPOSABLE) ×2 IMPLANT
GOWN STRL REUS W/TWL LRG LVL3 (GOWN DISPOSABLE) ×2
HEMOVAC 400CC 10FR (MISCELLANEOUS) ×2 IMPLANT
HOLDER FOLEY CATH W/STRAP (MISCELLANEOUS) ×2 IMPLANT
HOOD PEEL AWAY FLYTE STAYCOOL (MISCELLANEOUS) ×4 IMPLANT
KIT TURNOVER KIT A (KITS) ×2 IMPLANT
KNIFE SCULPS 14X20 (INSTRUMENTS) ×2 IMPLANT
LABEL OR SOLS (LABEL) ×2 IMPLANT
NDL SAFETY ECLIPSE 18X1.5 (NEEDLE) ×1 IMPLANT
NEEDLE HYPO 18GX1.5 SHARP (NEEDLE) ×1
NEEDLE SPNL 20GX3.5 QUINCKE YW (NEEDLE) ×4 IMPLANT
NS IRRIG 500ML POUR BTL (IV SOLUTION) ×2 IMPLANT
PACK TOTAL KNEE (MISCELLANEOUS) ×2 IMPLANT
PAD WRAPON POLAR KNEE (MISCELLANEOUS) ×1 IMPLANT
PIN DRILL QUICK PACK ×2 IMPLANT
PIN FIXATION 1/8DIA X 3INL (PIN) ×6 IMPLANT
PULSAVAC PLUS IRRIG FAN TIP (DISPOSABLE) ×2
SOL .9 NS 3000ML IRR  AL (IV SOLUTION) ×1
SOL .9 NS 3000ML IRR UROMATIC (IV SOLUTION) ×1 IMPLANT
SOL PREP PVP 2OZ (MISCELLANEOUS) ×2
SOLUTION PREP PVP 2OZ (MISCELLANEOUS) ×1 IMPLANT
SPONGE DRAIN TRACH 4X4 STRL 2S (GAUZE/BANDAGES/DRESSINGS) ×2 IMPLANT
SPONGE LAP 18X18 RF (DISPOSABLE) ×2 IMPLANT
STAPLER SKIN PROX 35W (STAPLE) ×2 IMPLANT
STRAP TIBIA SHORT (MISCELLANEOUS) ×2 IMPLANT
SUCTION FRAZIER HANDLE 10FR (MISCELLANEOUS) ×1
SUCTION TUBE FRAZIER 10FR DISP (MISCELLANEOUS) ×1 IMPLANT
SUT VIC AB 0 CT1 36 (SUTURE) ×2 IMPLANT
SUT VIC AB 1 CT1 36 (SUTURE) ×4 IMPLANT
SUT VIC AB 2-0 CT2 27 (SUTURE) ×2 IMPLANT
SYR 20CC LL (SYRINGE) ×2 IMPLANT
SYR 30ML LL (SYRINGE) ×4 IMPLANT
TIBIAL BASE ROT PLAT SZ 7 KNEE (Knees) ×2 IMPLANT
TIP FAN IRRIG PULSAVAC PLUS (DISPOSABLE) ×1 IMPLANT
TOWEL OR 17X26 4PK STRL BLUE (TOWEL DISPOSABLE) ×2 IMPLANT
TOWER CARTRIDGE SMART MIX (DISPOSABLE) ×2 IMPLANT
TRAY FOLEY MTR SLVR 16FR STAT (SET/KITS/TRAYS/PACK) ×2 IMPLANT
WRAPON POLAR PAD KNEE (MISCELLANEOUS) ×2

## 2018-05-08 NOTE — Anesthesia Procedure Notes (Signed)
Spinal  Patient location during procedure: OR Staffing Anesthesiologist: Gunnar Bulla, MD Performed: anesthesiologist  Preanesthetic Checklist Completed: patient identified, site marked, surgical consent, pre-op evaluation, timeout performed, IV checked and risks and benefits discussed Spinal Block Patient position: sitting Prep: ChloraPrep Patient monitoring: heart rate, cardiac monitor, continuous pulse ox and blood pressure Approach: midline Location: L3-4 Injection technique: single-shot Needle Needle type: Pencil-Tip  Needle gauge: 25 G Needle length: 9 cm Assessment Sensory level: T10

## 2018-05-08 NOTE — Anesthesia Procedure Notes (Deleted)
Performed by: Aline Brochure, CRNA Ventilation: Oral airway inserted - appropriate to patient size

## 2018-05-08 NOTE — Progress Notes (Signed)
   05/08/18 2100  Clinical Encounter Type  Visited With Patient and family together  Visit Type Initial;Other (Comment) (HCPOA)  Referral From Nurse  Spiritual Encounters  Spiritual Needs Literature  HCPOA/AD materials dropped off with patient.  [place] reviewed materials briefly, discussed process; offered additional spiritual and emotional support

## 2018-05-08 NOTE — H&P (Signed)
The patient has been re-examined, and the chart reviewed, and there have been no interval changes to the documented history and physical.    The risks, benefits, and alternatives have been discussed at length. The patient expressed understanding of the risks benefits and agreed with plans for surgical intervention.  Maelle Sheaffer P. Gabriana Wilmott, Jr. M.D.    

## 2018-05-08 NOTE — Anesthesia Preprocedure Evaluation (Addendum)
Anesthesia Evaluation  Patient identified by MRN, date of birth, ID band Patient awake    Reviewed: Allergy & Precautions, NPO status , Patient's Chart, lab work & pertinent test results, reviewed documented beta blocker date and time   Airway Mallampati: III  TM Distance: >3 FB     Dental  (+) Upper Dentures, Lower Dentures   Pulmonary Current Smoker,           Cardiovascular      Neuro/Psych PSYCHIATRIC DISORDERS Depression    GI/Hepatic   Endo/Other    Renal/GU Renal disease     Musculoskeletal  (+) Arthritis ,   Abdominal   Peds  Hematology   Anesthesia Other Findings Obese. Has had a back operation without metal. Will pproceed with spinal.  Reproductive/Obstetrics                            Anesthesia Physical Anesthesia Plan  ASA: III  Anesthesia Plan: Spinal   Post-op Pain Management:    Induction:   PONV Risk Score and Plan:   Airway Management Planned:   Additional Equipment:   Intra-op Plan:   Post-operative Plan:   Informed Consent: I have reviewed the patients History and Physical, chart, labs and discussed the procedure including the risks, benefits and alternatives for the proposed anesthesia with the patient or authorized representative who has indicated his/her understanding and acceptance.     Plan Discussed with: CRNA  Anesthesia Plan Comments:         Anesthesia Quick Evaluation

## 2018-05-08 NOTE — Op Note (Signed)
OPERATIVE NOTE  DATE OF SURGERY:  05/08/2018  PATIENT NAME:  Rodney Vega   DOB: [birth year]  MRN: [id number]  PRE-OPERATIVE DIAGNOSIS: Degenerative arthrosis of the right knee, primary  POST-OPERATIVE DIAGNOSIS:  Same  PROCEDURE:  Right total knee arthroplasty using computer-assisted navigation  SURGEON:  Marciano Sequin. M.D.  ASSISTANT:  Vance Peper, PA (present and scrubbed throughout the case, critical for assistance with exposure, retraction, instrumentation, and closure)  ANESTHESIA: spinal  ESTIMATED BLOOD LOSS: 50 mL  FLUIDS REPLACED: 1300 mL of crystalloid  TOURNIQUET TIME: 102 minutes  DRAINS: 2 medium Hemovac drains  SOFT TISSUE RELEASES: Anterior cruciate ligament, posterior cruciate ligament, deep and superficial medial collateral ligament, patellofemoral ligament  IMPLANTS UTILIZED: DePuy Attune size 7 posterior stabilized femoral component (cemented), size 7 rotating platform tibial component (cemented), 38 mm medialized dome patella (cemented), and a 6 mm stabilized rotating platform polyethylene insert.  INDICATIONS FOR SURGERY: Rodney Vega is a 62 y.o. year old male with a long history of progressive knee pain. X-rays demonstrated severe degenerative changes in tricompartmental fashion. The patient had not seen any significant improvement despite conservative nonsurgical intervention. After discussion of the risks and benefits of surgical intervention, the patient expressed understanding of the risks benefits and agree with plans for total knee arthroplasty.   The risks, benefits, and alternatives were discussed at length including but not limited to the risks of infection, bleeding, nerve injury, stiffness, blood clots, the need for revision surgery, cardiopulmonary complications, among others, and they were willing to proceed.  PROCEDURE IN DETAIL: The patient was brought into the operating room and, after adequate spinal anesthesia was achieved, a tourniquet was  placed on the patient's upper thigh. The patient's knee and leg were cleaned and prepped with alcohol and DuraPrep and draped in the usual sterile fashion. A "timeout" was performed as per usual protocol. The lower extremity was exsanguinated using an Esmarch, and the tourniquet was inflated to 300 mmHg. An anterior longitudinal incision was made followed by a standard mid vastus approach. The deep fibers of the medial collateral ligament were elevated in a subperiosteal fashion off of the medial flare of the tibia so as to maintain a continuous soft tissue sleeve. The patella was subluxed laterally and the patellofemoral ligament was incised. Inspection of the knee demonstrated severe degenerative changes with full-thickness loss of articular cartilage. Osteophytes were debrided using a rongeur. Anterior and posterior cruciate ligaments were excised. Two 4.0 mm Schanz pins were inserted in the femur and into the tibia for attachment of the array of trackers used for computer-assisted navigation. Hip center was identified using a circumduction technique. Distal landmarks were mapped using the computer. The distal femur and proximal tibia were mapped using the computer. The distal femoral cutting guide was positioned using computer-assisted navigation so as to achieve a 5 distal valgus cut. The femur was sized and it was felt that a size 7 femoral component was appropriate. A size 7 femoral cutting guide was positioned and the anterior cut was performed and verified using the computer. This was followed by completion of the posterior and chamfer cuts. Femoral cutting guide for the central box was then positioned in the center box cut was performed.  Attention was then directed to the proximal tibia. Medial and lateral menisci were excised. The extramedullary tibial cutting guide was positioned using computer-assisted navigation so as to achieve a 0 varus-valgus alignment and 3 posterior slope. The cut was  performed and verified using the computer. The proximal tibia  was sized and it was felt that a size 7 tibial tray was appropriate. Tibial and femoral trials were inserted followed by insertion of a 6 mm polyethylene insert. The knee was felt to be tight medially. A Cobb elevator was used to elevate the superficial fibers of the medial collateral ligament.  This allowed for excellent mediolateral soft tissue balancing both in flexion and in full extension. Finally, the patella was cut and prepared so as to accommodate a 38 mm medialized dome patella. A patella trial was placed and the knee was placed through a range of motion with excellent patellar tracking appreciated. The femoral trial was removed after debridement of posterior osteophytes. The central post-hole for the tibial component was reamed followed by insertion of a keel punch. Tibial trials were then removed. Cut surfaces of bone were irrigated with copious amounts of normal saline with antibiotic solution using pulsatile lavage and then suctioned dry. Polymethylmethacrylate cement with gentamicin was prepared in the usual fashion using a vacuum mixer. Cement was applied to the cut surface of the proximal tibia as well as along the undersurface of a size 7 rotating platform tibial component. Tibial component was positioned and impacted into place. Excess cement was removed using [employer] fast streamer. Cement was then applied to the cut surfaces of the femur as well as along the posterior flanges of the size 7 femoral component. The femoral component was positioned and impacted into place. Excess cement was removed using [employer] fast streamer. A 6 mm polyethylene trial was inserted and the knee was brought into full extension with steady axial compression applied. Finally, cement was applied to the backside of a 38 mm medialized dome patella and the patellar component was positioned and patellar clamp applied. Excess cement was removed using [employer] fast streamer. After  adequate curing of the cement, the tourniquet was deflated after a total tourniquet time of 102 minutes. Hemostasis was achieved using electrocautery. The knee was irrigated with copious amounts of normal saline with antibiotic solution using pulsatile lavage and then suctioned dry. 20 mL of 1.3% Exparel and 60 mL of 0.25% Marcaine in 40 mL of normal saline was injected along the posterior capsule, medial and lateral gutters, and along the arthrotomy site. A 6 mm stabilized rotating platform polyethylene insert was inserted and the knee was placed through a range of motion with excellent mediolateral soft tissue balancing appreciated and excellent patellar tracking noted. 2 medium drains were placed in the wound bed and brought out through separate stab incisions. The medial parapatellar portion of the incision was reapproximated using interrupted sutures of #1 Vicryl. Subcutaneous tissue was approximated in layers using first #0 Vicryl followed #2-0 Vicryl. The skin was approximated with skin staples. A sterile dressing was applied.  The patient tolerated the procedure well and was transported to the recovery room in stable condition.    James P. Holley Bouche., M.D.

## 2018-05-08 NOTE — Transfer of Care (Signed)
Immediate Anesthesia Transfer of Care Note  Patient: Rodney Vega  Procedure(s) Performed: COMPUTER ASSISTED TOTAL KNEE ARTHROPLASTY (Right Knee)  Patient Location: PACU  Anesthesia Type:Spinal  Level of Consciousness: sedated  Airway & Oxygen Therapy: Patient connected to face mask oxygen  Post-op Assessment: Post -op Vital signs reviewed and stable  Post vital signs: stable  Last Vitals:  Vitals Value Taken Time  BP 138/81 05/08/2018  5:29 PM  Temp    Pulse 78 05/08/2018  5:29 PM  Resp 14 05/08/2018  5:29 PM  SpO2 99 % 05/08/2018  5:29 PM  Vitals shown include unvalidated device data.  Last Pain:  Vitals:   05/08/18 1245  TempSrc: Oral  PainSc: 4          Complications: No apparent anesthesia complications

## 2018-05-08 NOTE — Anesthesia Post-op Follow-up Note (Signed)
Anesthesia QCDR form completed.        

## 2018-05-09 ENCOUNTER — Encounter: Payer: Self-pay | Admitting: Orthopedic Surgery

## 2018-05-09 MED ORDER — OXYCODONE HCL 5 MG PO TABS: 5.0000 mg | ORAL_TABLET | ORAL | 0 refills | Status: DC | PRN

## 2018-05-09 MED ORDER — TRAMADOL HCL 50 MG PO TABS: 50.0000 mg | ORAL_TABLET | Freq: Four times a day (QID) | ORAL | 0 refills | Status: DC | PRN

## 2018-05-09 MED ORDER — ENOXAPARIN SODIUM 40 MG/0.4ML ~~LOC~~ SOLN: 40.0000 mg | SUBCUTANEOUS | 0 refills | Status: DC

## 2018-05-09 NOTE — Progress Notes (Signed)
   Subjective: 1 Day Post-Op Procedure(s) (LRB): COMPUTER ASSISTED TOTAL KNEE ARTHROPLASTY (Right) Patient reports pain as 3 on 0-10 scale.   Patient is well, and has had no acute complaints or problems Nursing staff states that patient being on his leg at the side of the bed for quite some time during the night. Plan is to go Home after hospital stay. no nausea and no vomiting Patient denies any chest pains or shortness of breath. Slept well. Voicing no complaints  Objective: Vital signs in last 24 hours: Temp:  [97.2 F (36.2 C)-98.2 F (36.8 C)] 97.8 F (36.6 C) (08/06 0024) Pulse Rate:  [60-80] 68 (08/06 0024) Resp:  [14-21] 18 (08/06 0024) BP: (126-155)/(74-89) 143/84 (08/06 0024) SpO2:  [96 %-100 %] 97 % (08/06 0024) Weight:  [97.7 kg (215 lb 6.4 oz)] 97.7 kg (215 lb 6.4 oz) (08/05 1245) Heels are non tender and elevated off the bed using rolled towels along with bone foam under operative heel  Intake/Output from previous day: 08/05 0701 - 08/06 0700 In: 2131.7 [I.V.:2131.7] Out: 2140 [Urine:1900; Drains:190; Blood:50] Intake/Output this shift: No intake/output data recorded.  No results for input(s): HGB in the last 72 hours. No results for input(s): WBC, RBC, HCT, PLT in the last 72 hours. No results for input(s): NA, K, CL, CO2, BUN, CREATININE, GLUCOSE, CALCIUM in the last 72 hours. No results for input(s): LABPT, INR in the last 72 hours.  EXAM General - Patient is Alert, Appropriate and Oriented Extremity - Neurologically intact Neurovascular intact Sensation intact distally Intact pulses distally Dorsiflexion/Plantar flexion intact Compartment soft Dressing - dressing C/D/I Motor Function - intact, moving foot and toes well on exam.    Past Medical History:  Diagnosis Date  . Degenerative arthritis of knee   . Depression   . Diverticulitis   . Glaucoma    since 2004  . Hernia 1979  . History of kidney stones   . Kidney stone 2013  . Personal  history of tobacco use, presenting hazards to health   . Screening for obesity   . Special screening for malignant neoplasms, colon     Assessment/Plan: 1 Day Post-Op Procedure(s) (LRB): COMPUTER ASSISTED TOTAL KNEE ARTHROPLASTY (Right) Active Problems:   S/P total knee arthroplasty  Estimated body mass index is 30.04 kg/m as calculated from the following:   Height as of 04/26/18: 5\' 11"  (1.803 m).   Weight as of this encounter: 97.7 kg (215 lb 6.4 oz). Advance diet Up with therapy D/C IV fluids Plan for discharge tomorrow Discharge home with home health  Labs: None DVT Prophylaxis - Lovenox, Foot Pumps and TED hose Weight-Bearing as tolerated to right leg D/C O2 and Pulse OX and try on Room Air Begin working on bowel movement  Aleyna Cueva R. [place] 05/09/2018, 7:36 AM

## 2018-05-09 NOTE — Evaluation (Signed)
Occupational Therapy Evaluation Patient Details Name: Rodney Vega MRN: [id number] DOB: [birth year] Today's Date: 05/09/2018    History of Present Illness 62 yo male with onset of R TKA and plans to do L knee in the future.  PMHx:  OA, glaucoma,    Clinical Impression   Pt seen for OT evaluation this date, POD#1 from above surgery. Pt was independent in all ADLs prior to surgery, however occasionally using SPC for mobility due to R knee pain. Pt is eager to return to PLOF with less pain and improved safety and independence. Pt currently requires additional time/effort to perform LB dressing and requires assist for compression stocking mgt due to pain and limited AROM of R knee. Pt instructed in polar care mgt, falls prevention strategies including pet care considerations, home/routines modifications, DME/AE for LB bathing and dressing tasks, and compression stocking mgt. Pt/family verbalized understanding to all education/training provided. No additional skilled OT needs. Will sing off.       Follow Up Recommendations  No OT follow up    Equipment Recommendations  None recommended by OT    Recommendations for Other Services       Precautions / Restrictions Precautions Precautions: Fall;Knee Precaution Booklet Issued: Yes (comment) Precaution Comments: pt is aware of his drain and the use of immob not needed Required Braces or Orthoses: (pt can do SLR's at evaluation) Restrictions Weight Bearing Restrictions: Yes RLE Weight Bearing: Weight bearing as tolerated Other Position/Activity Restrictions: no immob needed      Mobility Bed Mobility Overal bed mobility: Modified Independent Bed Mobility: Supine to Sit;Sit to Supine     Supine to sit: Modified independent (Device/Increase time) Sit to supine: Modified independent (Device/Increase time)      Transfers Overall transfer level: Needs assistance Equipment used: Rolling walker (2 wheeled) Transfers: Sit to/from Stand Sit  to Stand: Supervision              Balance Overall balance assessment: No apparent balance deficits (not formally assessed)                                         ADL either performed or assessed with clinical judgement   ADL Overall ADL's : Modified independent                                       General ADL Comments: Pt able to perform with slightly increased time/effort, will have assist from spouse for compression stockings and polar care     Vision Baseline Vision/History: Glaucoma Patient Visual Report: No change from baseline Vision Assessment?: No apparent visual deficits     Perception     Praxis      Pertinent Vitals/Pain Pain Assessment: 0-10 Pain Score: 1  Pain Descriptors / Indicators: Aching Pain Intervention(s): Limited activity within patient's tolerance;Monitored during session;Premedicated before session;Repositioned;Ice applied     Hand Dominance Right   Extremity/Trunk Assessment Upper Extremity Assessment Upper Extremity Assessment: Overall WFL for tasks assessed   Lower Extremity Assessment Lower Extremity Assessment: Defer to PT evaluation;Overall WFL for tasks assessed(expected post-op strength/ROM deficits, however pt able to bend knee almost to 90 degrees sitting EOB without difficulty)   Cervical / Trunk Assessment Cervical / Trunk Assessment: Normal   Communication Communication Communication: No difficulties   Cognition Arousal/Alertness: Awake/alert Behavior During  Therapy: WFL for tasks assessed/performed Overall Cognitive Status: Within Functional Limits for tasks assessed                                     General Comments       Exercises Other Exercises Other Exercises: Pt instructed in compression stockings mgt Other Exercises: Pt instructed in polar care mgt Other Exercises: Pt instructed in AE for LB ADL tasks  Other Exercises: Pt instructed in falls prevention  strategies including pet care considerations   Shoulder Instructions      Home Living Family/patient expects to be discharged to:: Private residence Living Arrangements: Spouse/significant other Available Help at Discharge: Family;Available PRN/intermittently Type of Home: House Home Access: Stairs to enter [employer] of Steps: 4 Entrance Stairs-Rails: Left;Right;Can reach both Home Layout: One level     Bathroom Shower/Tub: Tub/shower unit;Walk-in shower   Bathroom Toilet: Standard     Home Equipment: Cane - single point   Additional Comments: will need RW for mobility      Prior Functioning/Environment Level of Independence: Independent with assistive device(s)        Comments: PRN use of SPC, no falls, indep with ADL, IADL, mobility, stays active        OT Problem List:        OT Treatment/Interventions:      OT Goals(Current goals can be found in the care plan section) Acute Rehab OT Goals Patient Stated Goal: to go home and recover OT Goal Formulation: All assessment and education complete, DC therapy  OT Frequency:     Barriers to D/C:            Co-evaluation              AM-PAC PT "6 Clicks" Daily Activity     Outcome Measure Help from another person eating meals?: None Help from another person taking care of personal grooming?: None Help from another person toileting, which includes using toliet, bedpan, or urinal?: None Help from another person bathing (including washing, rinsing, drying)?: A Little Help from another person to put on and taking off regular upper body clothing?: None Help from another person to put on and taking off regular lower body clothing?: A Little 6 Click Score: 22   End of Session    Activity Tolerance: Patient tolerated treatment well Patient left: in bed;with call bell/phone within reach;with bed alarm set;with family/visitor present  OT Visit Diagnosis: Other abnormalities of gait and mobility  (R26.89)                Time: 8657-8469 OT Time Calculation (min): 29 min Charges:  OT General Charges $OT Visit: 1 Visit OT Evaluation $OT Eval Low Complexity: 1 Low OT Treatments $Self Care/Home Management : 8-22 mins  Jeni Salles, MPH, MS, OTR/L ascom [phone] 05/09/18, 12:32 PM

## 2018-05-09 NOTE — Progress Notes (Signed)
Pt was assessed and did a great job in walking and following instructions from PT for mobility.  He is set for BID visits and expect him to progress well with all TKA rehab.  HHPT requested and can follow up with outpatient thereafter for final progression of rehab.       05/09/18 0900  PT Visit Information  Last PT Received On 05/09/18  Assistance Needed +1  History of Present Illness 62 yo male with onset of R TKA and plans to do L knee in the future.  PMHx:  OA, glaucoma,   Precautions  Precautions Fall;Knee  Precaution Booklet Issued Yes (comment)  Precaution Comments pt is aware of his drain and the use of immob not needed  Required Braces or Orthoses  (pt can do SLR's at evaluation)  Restrictions  Weight Bearing Restrictions Yes  RLE Weight Bearing WBAT  Other Position/Activity Restrictions no immob needed  Home Living  Family/patient expects to be discharged to: Private residence  Living Arrangements Spouse/significant other  Available Help at Discharge Family;Available PRN/intermittently  Type of Home House  Home Access Stairs to enter  Entrance Stairs-Number of Steps 4  Entrance Stairs-Rails Left;Right  Home Layout One level  Research officer, [employer] - single point  Additional Comments will need RW for mobility  Prior Function  Level of Independence Independent with assistive device(s)  Communication  Communication No difficulties  Pain Assessment  Pain Assessment 0-10  Pain Score 2  Pain Location R knee  Pain Descriptors / Indicators Operative site guarding  Pain Intervention(s) Repositioned;Premedicated before session;Ice applied  Cognition  Arousal/Alertness Awake/alert  Behavior During Therapy WFL for tasks assessed/performed  Overall Cognitive Status Within Functional Limits for tasks assessed  Upper Extremity Assessment  Upper Extremity Assessment Overall WFL for tasks assessed  Lower Extremity Assessment  Lower Extremity Assessment  RLE deficits/detail  RLE Deficits / Details new TKA with ROM and strength changes  RLE Coordination decreased fine motor;decreased gross motor  Cervical / Trunk Assessment  Cervical / Trunk Assessment Normal  Bed Mobility  Overal bed mobility Modified Independent  Bed Mobility Supine to Sit;Sit to Supine  General bed mobility comments pt is using hand placement as cued  Transfers  Overall transfer level Needs assistance  Equipment used Rolling walker (2 wheeled);1 person hand held assist  Transfers Sit to/from Stand  Sit to Stand Min guard  General transfer comment min guard for safety with standing  Ambulation/Gait  Ambulation/Gait assistance Min guard  Gait Distance (Feet) 200 Feet  Assistive device Rolling walker (2 wheeled);1 person hand held assist  Gait Pattern/deviations Step-to pattern;Step-through pattern;Decreased stride length;Decreased weight shift to right;Decreased dorsiflexion - right;Decreased dorsiflexion - left;Wide base of support  General Gait Details leads with LLE at times but appropriate offloading on RW for RLE  Gait velocity reduced  Gait velocity interpretation <1.31 ft/sec, indicative of household ambulator  Stairs Yes  Stairs assistance Min guard  Stair Management One rail Right;One rail Left;Step to pattern;Forwards  Number of Stairs 4 (4 up and down)  General stair comments Pt is able to follow cues and appropriately sequence stairs with RLE  Balance  Overall balance assessment No apparent balance deficits (not formally assessed)  Exercises  Exercises Total Joint  Total Joint Exercises  Goniometric ROM -11 ext and 76 flexion R knee AAROM  PT - End of Session  Equipment Utilized During Treatment Gait belt  Activity Tolerance Patient tolerated treatment well;Patient limited by fatigue  Patient left in  bed;with call bell/phone within reach;with family/visitor present  Nurse Communication Mobility status  PT Assessment  PT Recommendation/Assessment  Patient needs continued PT services  PT Visit Diagnosis Unsteadiness on feet (R26.81);Muscle weakness (generalized) (M62.81);Difficulty in walking, not elsewhere classified (R26.2)  PT Problem List Decreased strength;Decreased range of motion;Decreased activity tolerance;Decreased balance;Decreased mobility;Decreased coordination;Decreased knowledge of use of DME;Decreased safety awareness;Pain;Decreased skin integrity  Barriers to Discharge Inaccessible home environment;Decreased caregiver support  Barriers to Discharge Comments home with family but will be less as he improves  PT Plan  PT Frequency (ACUTE ONLY) BID  PT Treatment/Interventions (ACUTE ONLY) DME instruction;Gait training;Stair training;Functional mobility training;Therapeutic activities;Therapeutic exercise;Balance training;Neuromuscular re-education;Patient/family education  AM-PAC PT "6 Clicks" Daily Activity Outcome Measure  Difficulty turning over in bed (including adjusting bedclothes, sheets and blankets)? 3  Difficulty moving from lying on back to sitting on the side of the bed?  3  Difficulty sitting down on and standing up from a chair with arms (e.g., wheelchair, bedside commode, etc,.)? 3  Help needed moving to and from a bed to chair (including a wheelchair)? 3  Help needed walking in hospital room? 3  Help needed climbing 3-5 steps with a railing?  3  6 Click Score 18  Mobility G Code  CK  PT Recommendation  Follow Up Recommendations Home health PT;Supervision for mobility/OOB  PT equipment Rolling walker with 5" wheels  Individuals Consulted  Consulted and Agree with Results and Recommendations Patient;Family member/caregiver  Family Member Consulted wife  Acute Rehab PT Goals  Patient Stated Goal to go home and get ready for next knee surgery  PT Goal Formulation With patient/family  Time For Goal Achievement 05/23/18  Potential to Achieve Goals Good  PT Time Calculation  PT Start Time (ACUTE ONLY) 0756   PT Stop Time (ACUTE ONLY) 0824  PT Time Calculation (min) (ACUTE ONLY) 28 min  PT General Charges  $$ ACUTE PT VISIT 1 Visit  PT Evaluation  $PT Eval Moderate Complexity 1 Mod  PT Treatments  $Gait Training 8-22 mins    Mee Hives, PT MS Acute Rehab Dept. Number: [place]

## 2018-05-09 NOTE — Discharge Summary (Signed)
Physician Discharge Summary  Patient ID: Rodney Vega MRN: [id number] DOB/AGE: Jun 24, 1956 62 y.o.  Admit date: 05/08/2018 Discharge date: 05/10/2018  Admission Diagnoses:  OSTEOARTHRITIS RIGHT KNEE   Discharge Diagnoses: Patient Active Problem List   Diagnosis Date Noted  . Colostomy in place [facility]) 05/08/2018  . Nephrolithiasis 05/08/2018  . S/P total knee arthroplasty 05/08/2018  . Obesity (BMI 30.0-34.9) 10/11/2017  . Primary osteoarthritis of both knees 10/11/2017  . Colostomy status ([place]) 08/27/2013    Past Medical History:  Diagnosis Date  . Degenerative arthritis of knee   . Depression   . Diverticulitis   . Glaucoma    since 2004  . Hernia 1979  . History of kidney stones   . Kidney stone 2013  . Personal history of tobacco use, presenting hazards to health   . Screening for obesity   . Special screening for malignant neoplasms, colon      Transfusion: No transfusions during this admission   Consultants (if any):   Discharged Condition: Improved  Hospital Course: Rodney Vega is an 62 y.o. male who was admitted 05/08/2018 with a diagnosis of degenerative arthrosis right knee and went to the operating room on 05/08/2018 and underwent the above named procedures.    Surgeries:Procedure(s): COMPUTER ASSISTED TOTAL KNEE ARTHROPLASTY on 05/08/2018  PRE-OPERATIVE DIAGNOSIS: Degenerative arthrosis of the right knee, primary  POST-OPERATIVE DIAGNOSIS:  Same  PROCEDURE:  Right total knee arthroplasty using computer-assisted navigation  SURGEON:  Marciano Sequin. M.D.  ASSISTANT:  Vance Peper, PA (present and scrubbed throughout the case, critical for assistance with exposure, retraction, instrumentation, and closure)  ANESTHESIA: spinal  ESTIMATED BLOOD LOSS: 50 mL  FLUIDS REPLACED: 1300 mL of crystalloid  TOURNIQUET TIME: 102 minutes  DRAINS: 2 medium Hemovac drains  SOFT TISSUE RELEASES: Anterior cruciate ligament, posterior cruciate ligament, deep  and superficial medial collateral ligament, patellofemoral ligament  IMPLANTS UTILIZED: DePuy Attune size 7 posterior stabilized femoral component (cemented), size 7 rotating platform tibial component (cemented), 38 mm medialized dome patella (cemented), and a 6 mm stabilized rotating platform polyethylene insert.  INDICATIONS FOR SURGERY: Rodney Vega is a 62 y.o. year old male with a long history of progressive knee pain. X-rays demonstrated severe degenerative changes in tricompartmental fashion. The patient had not seen any significant improvement despite conservative nonsurgical intervention. After discussion of the risks and benefits of surgical intervention, the patient expressed understanding of the risks benefits and agree with plans for total knee arthroplasty.   The risks, benefits, and alternatives were discussed at length including but not limited to the risks of infection, bleeding, nerve injury, stiffness, blood clots, the need for revision surgery, cardiopulmonary complications, among others, and they were willing to proceed.   Patient tolerated the surgery well. No complications .Patient was taken to PACU where she was stabilized and then transferred to the orthopedic floor.  Patient started on Lovenox 30 mg q 12 hrs. Foot pumps applied bilaterally at 80 mm hgb. Heels elevated off bed with rolled towels. No evidence of DVT. Calves non tender. Negative Homan. Physical therapy started on day #1 for gait training and transfer with OT starting on  day #1 for ADL and assisted devices. Patient has done well with therapy. Ambulated greater than 200 feet upon being discharged.  Was able to ascend and descend 4 steps safely and independently  Patient's IV And Foley were discontinued on day #1 with Hemovac being discontinued on day #2. Dressing was changed on day 2 prior to patient being discharged  He was given perioperative antibiotics:  Anti-infectives (From admission, onward)    Start     Dose/Rate Route Frequency Ordered Stop   05/08/18 2000  ceFAZolin (ANCEF) IVPB 2g/100 mL premix     2 g 200 mL/hr over 30 Minutes Intravenous Every 6 hours 05/08/18 1829 05/09/18 1959   05/08/18 1221  ceFAZolin (ANCEF) 2-4 GM/100ML-% IVPB    Note to Pharmacy:  Lyman Bishop   : cabinet override      05/08/18 1221 05/08/18 2005   05/08/18 0600  ceFAZolin (ANCEF) IVPB 2g/100 mL premix  Status:  Discontinued     2 g 200 mL/hr over 30 Minutes Intravenous On call to O.R. 05/07/18 2149 05/08/18 1227    .  He was fitted with AV 1 compression foot pump devices, instructed on heel pumps, early ambulation, and fitted with TED stockings bilaterally for DVT prophylaxis.  He benefited maximally from the hospital stay and there were no complications.    Recent vital signs:  Vitals:   05/09/18 0024 05/09/18 0738  BP: (!) 143/84 (!) 151/80  Pulse: 68 78  Resp: 18 20  Temp: 97.8 F (36.6 C) 97.6 F (36.4 C)  SpO2: 97% 99%    Recent laboratory studies:  Lab Results  Component Value Date   HGB 14.5 04/26/2018   HGB 16.8 02/19/2017   HGB 12.5 (L) 09/21/2013   Lab Results  Component Value Date   WBC 7.6 04/26/2018   PLT 210 04/26/2018   Lab Results  Component Value Date   INR 0.95 04/26/2018   Lab Results  Component Value Date   NA 139 04/26/2018   K 3.9 04/26/2018   CL 105 04/26/2018   CO2 29 04/26/2018   BUN 7 (L) 04/26/2018   CREATININE 1.01 04/26/2018   GLUCOSE 139 (H) 04/26/2018    Discharge Medications:   Allergies as of 05/09/2018      Reactions   Aleve [naproxen Sodium] Itching      Medication List    STOP taking these medications   dorzolamide-timolol 22.3-6.8 MG/ML ophthalmic solution Commonly known as:  COSOPT   latanoprost 0.005 % ophthalmic solution Commonly known as:  XALATAN     TAKE these medications   acetaminophen 500 MG tablet Commonly known as:  TYLENOL Take 1,000 mg by mouth 3 (three) times daily as needed for moderate pain or  headache.   diphenhydramine-acetaminophen 25-500 MG Tabs tablet Commonly known as:  TYLENOL PM Take 1 tablet by mouth at bedtime.   enoxaparin 40 MG/0.4ML injection Commonly known as:  LOVENOX Inject 0.4 mLs (40 mg total) into the skin daily for 14 days. Start taking on:  05/11/2018   oxyCODONE 5 MG immediate release tablet Commonly known as:  Oxy IR/ROXICODONE Take 1 tablet (5 mg total) by mouth every 4 (four) hours as needed for moderate pain (pain score 4-6).   traMADol 50 MG tablet Commonly known as:  ULTRAM Take 1-2 tablets (50-100 mg total) by mouth every 6 (six) hours as needed for moderate pain.   venlafaxine XR 150 MG 24 hr capsule Commonly known as:  EFFEXOR-XR Take 150 mg by mouth daily.            Durable Medical Equipment  (From admission, onward)        Start     Ordered   05/08/18 1830  DME Walker rolling  Once    Question:  Patient needs a walker to treat with the following condition  Answer:  Total knee replacement  status   05/08/18 1829   05/08/18 1830  DME Bedside commode  Once    Question:  Patient needs a bedside commode to treat with the following condition  Answer:  Total knee replacement status   05/08/18 1829      Diagnostic Studies: Dg Knee Right Port  Result Date: 05/08/2018 CLINICAL DATA:  Total knee replacement EXAM: PORTABLE RIGHT KNEE - 1-2 VIEW COMPARISON:  None. FINDINGS: Status post right total knee arthroplasty with well seated femoral and tibial components. There is soft tissue swelling with a surgical drain and expected soft tissue gas. No abnormality. IMPRESSION: Expected postoperative appearance of right total knee arthroplasty. Electronically Signed   By: Ulyses Jarred M.D.   On: 05/08/2018 17:51    Disposition:   Discharge Instructions    Diet - low sodium heart healthy   Complete by:  As directed    Increase activity slowly   Complete by:  As directed       Follow-up Information    Watt Climes, PA On 05/23/2018.    Specialty:  Physician Assistant Why:  at 9:15am Contact information: [place] [address] [phone]        Dereck Leep, MD On 06/27/2018.   Specialty:  Orthopedic Surgery Why:  at 11:00am Contact information: [place] [address] [phone]            Signed: Watt Climes. 05/09/2018, 7:42 AM

## 2018-05-09 NOTE — Progress Notes (Signed)
Physical Therapy Treatment Patient Details Name: Lawrnce Vega MRN: [id number] DOB: 11-19-1955 Today's Date: 05/09/2018    History of Present Illness 62 yo male with onset of R TKA and plans to do L knee in the future.  PMHx:  OA, glaucoma,     PT Comments    Pt was seen for full exercise review and discussion about precautions for TKA.  He is voicing understanding about all rules and also about the sequence for car transfers.  Wife in attendance to see and hear all instructions.  Pt is getting improved ROM on R knee with routine and will plan to attach exercises to his dc summary.  Follow acutely for exercises, education and mobility as needed.   Follow Up Recommendations  Home health PT;Supervision for mobility/OOB     Equipment Recommendations  Rolling walker with 5" wheels    Recommendations for Other Services       Precautions / Restrictions Precautions Precautions: Fall;Knee Precaution Booklet Issued: Yes (comment) Precaution Comments: pt is aware of his drain and the use of immob not needed Restrictions Weight Bearing Restrictions: Yes RLE Weight Bearing: Weight bearing as tolerated    Mobility  Bed Mobility Overal bed mobility: Modified Independent Bed Mobility: Rolling              Transfers                    Ambulation/Gait                 Stairs             Wheelchair Mobility    Modified Rankin (Stroke Patients Only)       Balance                                            Cognition Arousal/Alertness: Awake/alert Behavior During Therapy: WFL for tasks assessed/performed Overall Cognitive Status: Within Functional Limits for tasks assessed                                        Exercises Total Joint Exercises Ankle Circles/Pumps: AROM;Both;5 reps Quad Sets: AROM;Both;10 reps Gluteal Sets: AROM;Both;10 reps Heel Slides: AROM;AAROM;Both;20 reps Hip ABduction/ADduction:  AROM;AAROM;Both;10 reps Straight Leg Raises: AROM;Both;10 reps Goniometric ROM: -9 ext, flexion 89 deg    General Comments        Pertinent Vitals/Pain Pain Assessment: 0-10 Pain Score: 1  Pain Location: R knee Pain Descriptors / Indicators: Operative site guarding Pain Intervention(s): Monitored during session;Premedicated before session    Home Living                      Prior Function            PT Goals (current goals can now be found in the care plan section) Progress towards PT goals: Progressing toward goals    Frequency    BID      PT Plan Current plan remains appropriate    Co-evaluation              AM-PAC PT "6 Clicks" Daily Activity  Outcome Measure  Difficulty turning over in bed (including adjusting bedclothes, sheets and blankets)?: A Little Difficulty moving from lying on back to sitting on the side of the  bed? : A Little Difficulty sitting down on and standing up from a chair with arms (e.g., wheelchair, bedside commode, etc,.)?: A Little Help needed moving to and from a bed to chair (including a wheelchair)?: A Little Help needed walking in hospital room?: A Little Help needed climbing 3-5 steps with a railing? : A Little 6 Click Score: 18    End of Session   Activity Tolerance: Patient tolerated treatment well Patient left: in bed;with call bell/phone within reach;with family/visitor present Nurse Communication: Mobility status PT Visit Diagnosis: Unsteadiness on feet (R26.81);Muscle weakness (generalized) (M62.81);Difficulty in walking, not elsewhere classified (R26.2)     Time: 3664-4034 PT Time Calculation (min) (ACUTE ONLY): 31 min  Charges:  $Therapeutic Exercise: 8-22 mins $Therapeutic Activity: 8-22 mins                     Ramond Dial 05/09/2018, 5:02 PM   Mee Hives, PT MS Acute Rehab Dept. Number: [place] [facility] [place]

## 2018-05-09 NOTE — Anesthesia Postprocedure Evaluation (Signed)
Anesthesia Post Note  Patient: Broox Lonigro  Procedure(s) Performed: COMPUTER ASSISTED TOTAL KNEE ARTHROPLASTY (Right Knee)  Patient location during evaluation: Other Anesthesia Type: Spinal Level of consciousness: oriented and awake and alert Pain management: pain level controlled Vital Signs Assessment: post-procedure vital signs reviewed and stable Respiratory status: spontaneous breathing, respiratory function stable and patient connected to nasal cannula oxygen Cardiovascular status: blood pressure returned to baseline and stable Postop Assessment: no headache, no backache and no apparent nausea or vomiting Anesthetic complications: no     Last Vitals:  Vitals:   05/09/18 0024 05/09/18 0738  BP: (!) 143/84 (!) 151/80  Pulse: 68 78  Resp: 18 20  Temp: 36.6 C 36.4 C  SpO2: 97% 99%    Last Pain:  Vitals:   05/09/18 0745  TempSrc:   PainSc: 6                  Alison Stalling

## 2018-05-10 NOTE — Progress Notes (Signed)
Physical Therapy Treatment Patient Details Name: Rodney Vega MRN: [id number] DOB: Feb 24, 1956 Today's Date: 05/10/2018    History of Present Illness 62 yo male with onset of R TKA and plans to do L knee in the future.  PMHx:  OA, glaucoma,     PT Comments    Pt presents with mild deficits in strength, transfers, gait, balance, R knee ROM, and activity tolerance but overall is doing very well and progress quickly towards goals.  Pt was Mod Ind with bed mobility tasks and was SBA with transfers and ambulation.  Pt was able to amb 200' with a RW with step-to pattern initially that progressed quickly to step-through pattern with improved cadence.  Pt steady ascending and descending 4 steps with B rails with SBA and min verbal cues for sequencing.  Pt will benefit from HHPT services upon discharge to safely address above deficits for decreased caregiver assistance and eventual return to PLOF.     Follow Up Recommendations  Home health PT;Supervision for mobility/OOB     Equipment Recommendations  Rolling walker with 5" wheels    Recommendations for Other Services       Precautions / Restrictions Precautions Precautions: Fall;Knee Restrictions Weight Bearing Restrictions: Yes RLE Weight Bearing: Weight bearing as tolerated    Mobility  Bed Mobility Overal bed mobility: Modified Independent                Transfers Overall transfer level: Needs assistance Equipment used: Rolling walker (2 wheeled) Transfers: Sit to/from Stand Sit to Stand: Supervision         General transfer comment: Min verbal cues for sequencing for correct hand placement  Ambulation/Gait Ambulation/Gait assistance: Supervision Gait Distance (Feet): 200 Feet Assistive device: Rolling walker (2 wheeled) Gait Pattern/deviations: Step-through pattern;Decreased stance time - right;Decreased step length - left Gait velocity: reduced   General Gait Details: Gait quality improved during the session  from near step-to pattern to step-through with improved cadence    Stairs Stairs: Yes Stairs assistance: Supervision Stair Management: Two rails Number of Stairs: 4 General stair comments: Min verbal cues for sequencing   Wheelchair Mobility    Modified Rankin (Stroke Patients Only)       Balance Overall balance assessment: No apparent balance deficits (not formally assessed)                                          Cognition Arousal/Alertness: Awake/alert Behavior During Therapy: WFL for tasks assessed/performed Overall Cognitive Status: Within Functional Limits for tasks assessed                                        Exercises Total Joint Exercises Ankle Circles/Pumps: AROM;Both;10 reps Quad Sets: AROM;Both;10 reps;Strengthening Long Arc Quad: AROM;Right;10 reps Knee Flexion: AROM;Right;10 reps;15 reps Goniometric ROM: R knee AROM 2-105 deg Marching in Standing: AROM;Both;5 reps;10 reps Other Exercises Other Exercises: Static balance training with feet apart and together without UE support with eyes open/closed and head still/head turns Other Exercises: Gait training education to prevent CKC twisting on the R knee during turns    General Comments        Pertinent Vitals/Pain Pain Assessment: 0-10 Pain Score: 1  Pain Location: R knee Pain Descriptors / Indicators: Sore Pain Intervention(s): Monitored during session    Home  Living                      Prior Function            PT Goals (current goals can now be found in the care plan section) Progress towards PT goals: Progressing toward goals    Frequency    BID      PT Plan Current plan remains appropriate    Co-evaluation              AM-PAC PT "6 Clicks" Daily Activity  Outcome Measure                   End of Session Equipment Utilized During Treatment: Gait belt Activity Tolerance: Patient tolerated treatment well Patient  left: in bed;with call bell/phone within reach;with family/visitor present Nurse Communication: Mobility status PT Visit Diagnosis: Muscle weakness (generalized) (M62.81);Other abnormalities of gait and mobility (R26.89)     Time: 1836-7255 PT Time Calculation (min) (ACUTE ONLY): 26 min  Charges:  $Gait Training: 8-22 mins $Therapeutic Exercise: 8-22 mins                     D. Scott Alec Mcphee PT, DPT 05/10/18, 12:13 PM

## 2018-05-10 NOTE — Progress Notes (Signed)
Clinical Education officer, museum (CSW) received SNF consult. PT is recommending home health. RN case manger aware of above. Please reconsult if future social work needs arise. CSW signing off.   [employer], LCSW [phone]

## 2018-05-10 NOTE — Progress Notes (Signed)
DISCHARGE NOTE:  Pt given discharge instructions and prescriptions. Pt verbalized understanding. Honeycomb dressing changed (extra sent with pt), TED hose on both legs, bone foam and walker sent with pt. Pt wheeled to car by staff.

## 2018-05-10 NOTE — Progress Notes (Addendum)
   Subjective: 2 Days Post-Op Procedure(s) (LRB): COMPUTER ASSISTED TOTAL KNEE ARTHROPLASTY (Right) Patient reports pain as mild.   Patient is well, and has had no acute complaints or problems Patient did well with physical therapy yesterday.  Was able to ambulate 200 feet.  Has full extension with flexion being approximately 90 degrees. Plan is to go Home after hospital stay. no nausea and no vomiting Patient denies any chest pains or shortness of breath. Patient voicing no complaints. Patient sleeping in a fetal position with no bone foam under leg.  Objective: Vital signs in last 24 hours: Temp:  [97.6 F (36.4 C)-97.8 F (36.6 C)] 97.8 F (36.6 C) (08/07 0012) Pulse Rate:  [55-78] 71 (08/07 0012) Resp:  [15-20] 15 (08/07 0012) BP: (116-151)/(59-80) 116/59 (08/07 0012) SpO2:  [97 %-100 %] 97 % (08/07 0012) Weight:  [98.4 kg (217 lb)] 98.4 kg (217 lb) (08/06 0738) well approximated incision Heels are non tender and elevated off the bed using rolled towels Intake/Output from previous day: 08/06 0701 - 08/07 0700 In: 2224.3 [P.O.:721; I.V.:780; IV Piggyback:723.3] Out: 1460 [Urine:1200; Drains:260] Intake/Output this shift: Total I/O In: 241 [P.O.:241] Out: 670 [Urine:600; Drains:70]  No results for input(s): HGB in the last 72 hours. No results for input(s): WBC, RBC, HCT, PLT in the last 72 hours. No results for input(s): NA, K, CL, CO2, BUN, CREATININE, GLUCOSE, CALCIUM in the last 72 hours. No results for input(s): LABPT, INR in the last 72 hours.  EXAM General - Patient is Alert, Appropriate and Oriented Extremity - Neurologically intact Neurovascular intact Sensation intact distally Intact pulses distally Dorsiflexion/Plantar flexion intact No cellulitis present Compartment soft Dressing - scant drainage Motor Function - intact, moving foot and toes well on exam.    Past Medical History:  Diagnosis Date  . Degenerative arthritis of knee   . Depression   .  Diverticulitis   . Glaucoma    since 2004  . Hernia 1979  . History of kidney stones   . Kidney stone 2013  . Personal history of tobacco use, presenting hazards to health   . Screening for obesity   . Special screening for malignant neoplasms, colon     Assessment/Plan: 2 Days Post-Op Procedure(s) (LRB): COMPUTER ASSISTED TOTAL KNEE ARTHROPLASTY (Right) Active Problems:   S/P total knee arthroplasty  Estimated body mass index is 30.27 kg/m as calculated from the following:   Height as of this encounter: 5\' 11"  (1.803 m).   Weight as of this encounter: 98.4 kg (217 lb). Up with therapy Discharge home with home health  Labs: None DVT Prophylaxis - Lovenox, Foot Pumps and TED hose Weight-Bearing as tolerated to right leg Hemovac was discontinued today.  Into the drain appeared to be intact. Patient needs a bowel movement Please wash operative leg, change dressing and apply TED stockings. Bone foam is to go home with patient Please give the patient 2 extra honeycomb dressings to take home  [place]. [place] 05/10/2018, 6:41 AM

## 2018-05-10 NOTE — Care Management Note (Signed)
Case Management Note  Patient Details  Name: Rodney Vega MRN: [id number] Date of Birth: 06-25-1956  Subjective/Objective:  Met with patient and his wife at bedside to discuss discharge planning. Patient will need a walker and bsc. Ordered from [place] with [facility]. Offered a list of home health agencies. [facility]erral to [facility] for [facility] health PT[phone]. Cost of Lovenox can not be received due to [facility] computer problems. Patients wife updated.                   Action/Plan: [facility] for HHPT. [facility] for DME.   Expected Discharge Date:  05/10/18               Expected Discharge Plan:  [place]  In-House Referral:     Discharge planning Services  CM Consult  Post Acute Care Choice:  Durable Medical Equipment, Home Health Choice offered to:  Patient  DME Arranged:  Walker rolling, 3-N-1 DME Agency:  [place]:  PT [place]:  [facility]  Status of Service:  Completed, signed off  If discussed at [employer] of Stay Meetings, dates discussed:    Additional Comments:  Jolly Mango, RN 05/10/2018, 9:29 AM

## 2019-10-18 NOTE — Discharge Instructions (Signed)
Instructions after Total Knee Replacement   Rodney Vega, Jr., M.D.     Dept. of Orthopaedics & Sports Medicine  [facility]  [address]  [place], [place]  [address]  Phone: [phone]   Fax: [phone]    DIET: Drink plenty of non-alcoholic fluids. Resume your normal diet. Include foods high in fiber.  ACTIVITY:  You may use crutches or a walker with weight-bearing as tolerated, unless instructed otherwise. You may be weaned off of the walker or crutches by your Physical Therapist.  Do NOT place pillows under the knee. Anything placed under the knee could limit your ability to straighten the knee.   Continue doing gentle exercises. Exercising will reduce the pain and swelling, increase motion, and prevent muscle weakness.   Please continue to use the TED compression stockings for 6 weeks. You may remove the stockings at night, but should reapply them in the morning. Do not drive or operate any equipment until instructed.  WOUND CARE:  Continue to use the PolarCare or ice packs periodically to reduce pain and swelling. You may bathe or shower after the staples are removed at the first office visit following surgery.  MEDICATIONS: You may resume your regular medications. Please take the pain medication as prescribed on the medication. Do not take pain medication on an empty stomach. You have been given a prescription for a blood thinner (Lovenox or Coumadin). Please take the medication as instructed. (NOTE: After completing a 2 week course of Lovenox, take one Enteric-coated aspirin once a day. This along with elevation will help reduce the possibility of phlebitis in your operated leg.) Do not drive or drink alcoholic beverages when taking pain medications.  CALL THE OFFICE FOR: Temperature above 101 degrees Excessive bleeding or drainage on the dressing. Excessive swelling, coldness, or paleness of the toes. Persistent nausea and vomiting.  FOLLOW-UP:  You  should have an appointment to return to the office in 10-14 days after surgery. Arrangements have been made for continuation of Physical Therapy (either home therapy or outpatient therapy).   [facility] Department Directory         [email or web address]       [email or web address]          Cardiology  Appointments: [place] - [phone] [place] - [phone]  Endocrinology  Appointments: [place] - [phone] [place] - [phone]  Gastroenterology  Appointments: [place] - [phone] [place] - [phone]        General Surgery   Appointments: [place] - [phone]  Internal Medicine/Family Medicine  Appointments: [place] - [phone] [place] - [phone] [place] - [phone]  Metabolic and Weigh Loss Surgery  Appointments: [place] - [phone]        Neurology  Appointments: [place] - [phone] [place] - [phone]  Neurosurgery  Appointments: [place] - [phone]  Obstetrics & Gynecology  Appointments: [place] - [phone] [place] - [phone]        Pediatrics  Appointments: [place] - [phone] [place] - [phone]  Physiatry  Appointments: [place] -[phone]  Physical Therapy  Appointments: [place] - [phone] [place] - [phone]        Podiatry  Appointments: [place] - [phone] [place] - [phone]  Pulmonology  Appointments: [place] - [phone]  Rheumatology  Appointments: [place] - [phone]         Location: [facility]  [address] , [place]  [address]  [place] Location: [facility] [address] [place], [place]  [address]  [place] Location: [facility] [address] [place], [place]  [address]    

## 2019-10-19 ENCOUNTER — Encounter
Admission: RE | Admit: 2019-10-19 | Discharge: 2019-10-19 | Disposition: A | Discharge status: Home / Self Care | Payer: BC Managed Care – PPO | Source: Ambulatory Visit | Attending: Orthopedic Surgery | Admitting: Orthopedic Surgery

## 2019-10-19 ENCOUNTER — Encounter: Payer: Self-pay | Admitting: Orthopedic Surgery

## 2019-10-19 ENCOUNTER — Other Ambulatory Visit: Payer: Self-pay

## 2019-10-19 DIAGNOSIS — Z01818 Encounter for other preprocedural examination: Secondary | ICD-10-CM | POA: Insufficient documentation

## 2019-10-19 NOTE — Patient Instructions (Signed)
Your procedure is scheduled on: Wed. 1/20 Report to Day Surgery. [facility] To find out your arrival time please call [phone] between 1PM - 3PM on Tues 1/19.  Remember: Instructions that are not followed completely may result in serious medical risk,  up to and including death, or upon the discretion of your surgeon and anesthesiologist your  surgery may need to be rescheduled.     _X__ 1. Do not eat food after midnight the night before your procedure.                 No gum chewing or hard candies. You may drink clear liquids up to 2 hours                 before you are scheduled to arrive for your surgery- DO not drink clear                 liquids within 2 hours of the start of your surgery.                 Clear Liquids include:  water, apple juice without pulp, clear carbohydrate COMPLETE DRINK  2 HOURS BEFORE ARRIVAL                 Gatorade, Black Coffee or Tea (Do not add                 anything to coffee or tea).  __X__2.  On the morning of surgery brush your teeth with toothpaste and water, you                may rinse your mouth with mouthwash if you wish.  Do not swallow any toothpaste of mouthwash.     _X__ 3.  No Alcohol for 24 hours before or after surgery.   _X__ 4.  Do Not Smoke or use e-cigarettes For 24 Hours Prior to Your Surgery.                 Do not use any chewable tobacco products for at least 6 hours prior to                 surgery.  ____  5.  Bring all medications with you on the day of surgery if instructed.   __x__  6.  Notify your doctor if there is any change in your medical condition      (cold, fever, infections).     Do not wear jewelry, make-up, hairpins, clips or nail polish. Do not wear lotions, powders, or perfumes. You may wear deodorant. Do not shave 48 hours prior to surgery. Men may shave face and neck. Do not bring valuables to the hospital.    [facility] is not responsible for any belongings or valuables.  Contacts,  dentures or bridgework may not be worn into surgery. Leave your suitcase in the car. After surgery it may be brought to your room. For patients admitted to the hospital, discharge time is determined by your treatment team.   Patients discharged the day of surgery will not be allowed to drive home.   Please read over the following fact sheets that you were given:     _x___ Take these medicines the morning of surgery with A SIP OF WATER:    1. EYE DROPS  2. TYLENOL IF NEEDED   3.   4.  5.  6.  ____ Fleet Enema (as directed)   _x___ Use CHG Soap as  directed  ____ Use inhalers on the day of surgery  ____ Stop metformin 2 days prior to surgery    ____ Take 1/2 of usual insulin dose the night before surgery. No insulin the morning          of surgery.   ____ Stop Coumadin/Plavix/aspirin on   _X__ Stop Anti-inflammatories  No ibuprofen     May continue tylenol   ____ Stop supplements until after surgery.    ____ Bring C-Pap to the hospital.

## 2019-10-22 ENCOUNTER — Other Ambulatory Visit: Admission: RE | Admit: 2019-10-22 | Payer: BC Managed Care – PPO | Source: Ambulatory Visit

## 2019-10-22 ENCOUNTER — Other Ambulatory Visit: Payer: Self-pay

## 2019-10-22 ENCOUNTER — Encounter
Admission: RE | Admit: 2019-10-22 | Discharge: 2019-10-22 | Disposition: A | Discharge status: Home / Self Care | Payer: BC Managed Care – PPO | Source: Ambulatory Visit | Attending: Orthopedic Surgery | Admitting: Orthopedic Surgery

## 2019-10-22 DIAGNOSIS — Z01818 Encounter for other preprocedural examination: Secondary | ICD-10-CM | POA: Diagnosis present

## 2019-10-22 DIAGNOSIS — R9431 Abnormal electrocardiogram [ECG] [EKG]: Secondary | ICD-10-CM | POA: Insufficient documentation

## 2019-10-22 DIAGNOSIS — Z20822 Contact with and (suspected) exposure to covid-19: Secondary | ICD-10-CM | POA: Diagnosis not present

## 2019-10-22 LAB — SEDIMENTATION RATE: Sed Rate: 15 mm/hr (ref 0–20)

## 2019-10-22 LAB — URINALYSIS, ROUTINE W REFLEX MICROSCOPIC
Bacteria, UA: NONE SEEN
Bilirubin Urine: NEGATIVE
Glucose, UA: 150 mg/dL — AB
Ketones, ur: NEGATIVE mg/dL
Leukocytes,Ua: NEGATIVE
Nitrite: NEGATIVE
Protein, ur: NEGATIVE mg/dL
Specific Gravity, Urine: 1.018 (ref 1.005–1.030)
Squamous Epithelial / HPF: NONE SEEN (ref 0–5)
pH: 6 (ref 5.0–8.0)

## 2019-10-22 LAB — CBC
HCT: 39.7 % (ref 39.0–52.0)
Hemoglobin: 14.9 g/dL (ref 13.0–17.0)
MCH: 35 pg — ABNORMAL HIGH (ref 26.0–34.0)
MCHC: 37.5 g/dL — ABNORMAL HIGH (ref 30.0–36.0)
MCV: 93.2 fL (ref 80.0–100.0)
Platelets: 219 10*3/uL (ref 150–400)
RBC: 4.26 MIL/uL (ref 4.22–5.81)
RDW: 12.6 % (ref 11.5–15.5)
WBC: 7.7 10*3/uL (ref 4.0–10.5)
nRBC: 0 % (ref 0.0–0.2)

## 2019-10-22 LAB — TYPE AND SCREEN
ABO/RH(D): B POS
Antibody Screen: NEGATIVE

## 2019-10-22 LAB — PROTIME-INR
INR: 1 (ref 0.8–1.2)
Prothrombin Time: 13.1 seconds (ref 11.4–15.2)

## 2019-10-22 LAB — COMPREHENSIVE METABOLIC PANEL
ALT: 16 U/L (ref 0–44)
AST: 26 U/L (ref 15–41)
Albumin: 4 g/dL (ref 3.5–5.0)
Alkaline Phosphatase: 80 U/L (ref 38–126)
Anion gap: 9 (ref 5–15)
BUN: 10 mg/dL (ref 8–23)
CO2: 28 mmol/L (ref 22–32)
Calcium: 8.9 mg/dL (ref 8.9–10.3)
Chloride: 101 mmol/L (ref 98–111)
Creatinine, Ser: 1.02 mg/dL (ref 0.61–1.24)
GFR calc Af Amer: >60 mL/min (ref >60)
GFR calc non Af Amer: >60 mL/min (ref >60)
Glucose, Bld: 175 mg/dL — ABNORMAL HIGH (ref 70–99)
Potassium: 4 mmol/L (ref 3.5–5.1)
Sodium: 138 mmol/L (ref 135–145)
Total Bilirubin: 0.9 mg/dL (ref 0.3–1.2)
Total Protein: 7.3 g/dL (ref 6.5–8.1)

## 2019-10-22 LAB — C-REACTIVE PROTEIN: CRP: 0.7 mg/dL (ref <1.0)

## 2019-10-22 LAB — APTT: aPTT: 30 seconds (ref 24–36)

## 2019-10-22 LAB — SURGICAL PCR SCREEN
MRSA, PCR: NEGATIVE
Staphylococcus aureus: POSITIVE — AB

## 2019-10-22 LAB — SARS CORONAVIRUS 2 (TAT 6-24 HRS): SARS Coronavirus 2: NEGATIVE

## 2019-10-22 NOTE — Pre-Procedure Instructions (Addendum)
Pre-Admit Testing Provider Notification Note  Provider Notified: Dr. Marry Guan  Notification Mode: Fax AND Secure Chat  Reason: Abnormal EKG. Request for Clearance.  Response: Fax confirmation received. Awaiting Secure Chat Response.  Additional Information: Placed on chart. Noted on Pre-Admit Worksheet.   Signed: Beulah Gandy, RN

## 2019-10-23 LAB — URINE CULTURE
Culture: NO GROWTH
Special Requests: NORMAL

## 2019-10-23 NOTE — Pre-Procedure Instructions (Signed)
Will do stress test on 1/20 and will proceed if OK.

## 2019-10-24 ENCOUNTER — Ambulatory Visit
Admission: RE | Admit: 2019-10-24 | Payer: BC Managed Care – PPO | Source: Home / Self Care | Admitting: Orthopedic Surgery

## 2019-10-24 ENCOUNTER — Encounter: Admission: RE | Payer: Self-pay | Source: Home / Self Care

## 2019-10-24 HISTORY — DX: Prediabetes: R73.03

## 2019-10-24 HISTORY — DX: Essential (primary) hypertension: I10

## 2019-10-24 SURGERY — ARTHROPLASTY, KNEE, TOTAL, USING IMAGELESS COMPUTER-ASSISTED NAVIGATION
Anesthesia: Choice | Site: Knee | Laterality: Left

## 2019-11-07 NOTE — H&P (Signed)
ORTHOPAEDIC HISTORY & PHYSICAL  Progress Notes by Feliberto Gottron, [place] at 10/19/2019 2:00 PM  Chief Complaint:    Chief Complaint  Patient presents with  . Left Knee - Pre-op Exam    Rodney Vega is a 64 y.o. male who presents today for history and physical for left total knee arthroplasty with Dr. Marry Guan on 10/24/2019.  Patient has had years of progressive left knee pain that is recently interfered with his quality of life and activities of daily living.  Patient unable to walk more than 100 feet without his left knee buckling and giving away.  He has had a successful right total knee arthroplasty which is doing very well.  Despite conservative treatment with Tylenol, cortisone injections, gel injections and topical creams has been unable to get any relief of his left knee pain.  Pain is moderate to severe with weightbearing activity.  He will have swelling with activity.  He gets relief only with sitting.  Pain is located along the medial joint line is described as a deep ache.  X-rays show severe degenerative changes with complete loss of joint space in the medial compartment of the left knee.  Patient has seen Dr. Marry Guan, discussed his left knee and has agreed and consented to left total knee arthroplasty.    Past Medical History:     Past Medical History:  Diagnosis Date  . Colostomy in place (CMS-HCC)   . Diverticula of colon    surgery for colostomy  . Diverticulitis   . Glaucoma   . Kidney stone   . Kidney stone   . Nephrolithiasis    REQUIRING LITHOTRIPSY  . Osteoarthritis   . Situational depression     Past Surgical History:      Past Surgical History:  Procedure Laterality Date  . back surgery  1994 and June of 2012  . BACK SURGERY     x 2  . COLON SURGERY  04/2013  . HERNIA REPAIR  1979  . LITHOTRIPSY  2013   KIDNEY STONES  . Right total knee arthroplasty using computer-assisted navigation   05/08/2018   Dr Marry Guan  . Tdap   07/2012  . TONSILLECTOMY  1964    Past Family History: Family History       Family History  Problem Relation Age of Onset  . High blood pressure (Hypertension) Mother   . Diabetes type II Mother 46  . Coronary Artery Disease (Blocked arteries around heart) Father 49       MI  . Prostate cancer Father 68      Medications:       Current Outpatient Medications Ordered in Epic  Medication Sig Dispense Refill  . acetaminophen (TYLENOL) 500 MG tablet Take 1,000 mg by mouth every 6 (six) hours as needed      . diphenhydrAMINE-acetaminophen (TYLENOL PM) 25-500 mg per tablet Take 1 tablet by mouth nightly as needed      . dorzolamide-timolol (COSOPT) 22.3-6.8 mg/mL ophthalmic solution Place 1 drop into both eyes 2 (two) times daily      . latanoprost (XALATAN) 0.005 % ophthalmic solution Place 1 drop into both eyes nightly      . losartan (COZAAR) 100 MG tablet Take 1 tablet (100 mg total) by mouth once daily 30 tablet 11  . triamcinolone 0.1 % cream Apply topically 2 (two) times daily 30 g 0  . venlafaxine (EFFEXOR-XR) 75 MG XR capsule Take 3 capsules (225 mg total) by mouth once daily 90 capsule  11   No current Epic-ordered facility-administered medications on file.     Allergies:      Allergies  Allergen Reactions  . Aleve [Naproxen Sodium] Hives  . Nsaids (Non-Steroidal Anti-Inflammatory Drug) Abdominal Pain    H/O diverticulitis, has colostomy     Review of Systems:  A comprehensive 14 point ROS was performed, reviewed by me today, and the pertinent orthopaedic findings are documented in the HPI.   Exam: BP 144/70   Wt 99.8 kg (220 lb)   BMI 31.57 kg/m  General:  Well developed, well nourished, no apparent distress, normal affect, antalgic gait with varus thrust.  HEENT: Head normocephalic, atraumatic, PERRL.  Upper and lower dentures present.  Abdomen: Soft, non tender, non distended, Bowel sounds present.  Heart: Examination of the heart  reveals regular, rate, and rhythm.  There is no murmur noted on ascultation.  There is a normal apical pulse.  Lungs: Lungs are clear to auscultation.  There is no wheeze, rhonchi, or crackles.  There is normal expansion of bilateral chest walls.    Left lower extremity: Examination of the left lower extremity shows patient has good internal and external rotation of the hip with no discomfort.  He is able to straight leg raise left knee, lacks a few degrees of active extension.  Active flexion to 115 degrees.  Knee is stable to valgus and varus stress testing.  There is a varus deformity that is not passively corrected.  Patella tracks well the trochlear groove.  Mild swelling and effusion noted.  Negative Homans' sign.  Neurovascular intact in left lower extremity.   AP lateral and sunrise views of the left knee are ordered interpreted by me in the office today.  Impression: Patient has varus deformity to the left knee with severe degenerative changes, complete loss of joint space in the medial compartment.  Subchondral changes noted in the lateral tibial plateau with mild spurring along the lateral femoral condyle.  Small loose bodies noted along the posterior knee.  Patellofemoral spurring noted with lateral tracking of the patella in the trochlear groove.  No evidence of acute bony abnormality.  No abnormal bony lesions.   Impression: Primary osteoarthritis of left knee [M17.12] Primary osteoarthritis of left knee  (primary encounter diagnosis)  Plan:  1.  Risks, benefits, complications of a left total knee arthroplasty have been discussed with the patient.  Patient has agreed and consented procedure with Dr. Marry Guan on 10/24/2019.  This note was generated in part with voice recognition software and I apologize for any typographical errors that were not detected and corrected.   Feliberto Gottron MPA-C      Electronically signed by Feliberto Gottron, [place] on  10/19/2019 3:37 PM

## 2019-11-13 ENCOUNTER — Other Ambulatory Visit: Payer: Self-pay

## 2019-11-13 ENCOUNTER — Encounter
Admission: RE | Admit: 2019-11-13 | Discharge: 2019-11-13 | Disposition: A | Discharge status: Home / Self Care | Payer: BC Managed Care – PPO | Source: Ambulatory Visit | Attending: Orthopedic Surgery | Admitting: Orthopedic Surgery

## 2019-11-13 DIAGNOSIS — Z20822 Contact with and (suspected) exposure to covid-19: Secondary | ICD-10-CM | POA: Insufficient documentation

## 2019-11-13 DIAGNOSIS — Z01812 Encounter for preprocedural laboratory examination: Secondary | ICD-10-CM | POA: Insufficient documentation

## 2019-11-13 NOTE — Patient Instructions (Signed)
Your procedure is scheduled on: 10/16/19 Report to [place]. To find out your arrival time please call [phone] between 1PM - 3PM on 10/15/19.  Remember: Instructions that are not followed completely may result in serious medical risk, up to and including death, or upon the discretion of your surgeon and anesthesiologist your surgery may need to be rescheduled.     _X__ 1. Do not eat food after midnight the night before your procedure.                 No gum chewing or hard candies. You may drink clear liquids up to 2 hours                 before you are scheduled to arrive for your surgery- DO not drink clear                 liquids within 2 hours of the start of your surgery.                 Clear Liquids include:  water, apple juice without pulp, clear carbohydrate                 drink such as Clearfast or Gatorade, Black Coffee or Tea (Do not add                 anything to coffee or tea). Diabetics water only  __X__2.  On the morning of surgery brush your teeth with toothpaste and water, you                 may rinse your mouth with mouthwash if you wish.  Do not swallow any              toothpaste of mouthwash.     _X__ 3.  No Alcohol for 24 hours before or after surgery.   _X__ 4.  Do Not Smoke or use e-cigarettes For 24 Hours Prior to Your Surgery.                 Do not use any chewable tobacco products for at least 6 hours prior to                 surgery.  ____  5.  Bring all medications with you on the day of surgery if instructed.   __X__  6.  Notify your doctor if there is any change in your medical condition      (cold, fever, infections).     Do not wear jewelry, make-up, hairpins, clips or nail polish. Do not wear lotions, powders, or perfumes.  Do not shave 48 hours prior to surgery. Men may shave face and neck. Do not bring valuables to the hospital.    [facility] is not responsible for any belongings or  valuables.  Contacts, dentures/partials or body piercings may not be worn into surgery. Bring a case for your contacts, glasses or hearing aids, a denture cup will be supplied. Leave your suitcase in the car. After surgery it may be brought to your room. For patients admitted to the hospital, discharge time is determined by your treatment team.   Patients discharged the day of surgery will not be allowed to drive home.   Please read over the following fact sheets that you were given:   MRSA Information  __X__ Take these medicines the morning of surgery with A SIP OF WATER:  1. venlafaxine XR (EFFEXOR-XR) 75 MG 24 hr capsule  2.   3.   4.  5.  6.  ____ Fleet Enema (as directed)   __X__ Use CHG Soap/SAGE wipes as directed  ____ Use inhalers on the day of surgery  ____ Stop metformin/Janumet/Farxiga 2 days prior to surgery    ____ Take 1/2 of usual insulin dose the night before surgery. No insulin the morning          of surgery.   ____ Stop Blood Thinners Coumadin/Plavix/Xarelto/Pleta/Pradaxa/Eliquis/Effient/Aspirin  on   Or contact your Surgeon, Cardiologist or Medical Doctor regarding  ability to stop your blood thinners  __X__ Stop Anti-inflammatories 7 days before surgery such as Advil, Ibuprofen, Motrin,  BC or Goodies Powder, Naprosyn, Naproxen, Aleve, Aspirin    __X__ Stop all herbal supplements, fish oil or vitamin E until after surgery.    ____ Bring C-Pap to the hospital.

## 2019-11-14 ENCOUNTER — Other Ambulatory Visit
Admission: RE | Admit: 2019-11-14 | Discharge: 2019-11-14 | Disposition: A | Discharge status: Home / Self Care | Payer: BC Managed Care – PPO | Source: Ambulatory Visit | Attending: Orthopedic Surgery | Admitting: Orthopedic Surgery

## 2019-11-14 DIAGNOSIS — Z01812 Encounter for preprocedural laboratory examination: Secondary | ICD-10-CM | POA: Diagnosis present

## 2019-11-14 DIAGNOSIS — Z20822 Contact with and (suspected) exposure to covid-19: Secondary | ICD-10-CM | POA: Diagnosis not present

## 2019-11-14 LAB — SARS CORONAVIRUS 2 (TAT 6-24 HRS): SARS Coronavirus 2: NEGATIVE

## 2019-11-15 ENCOUNTER — Encounter: Payer: Self-pay | Admitting: Orthopedic Surgery

## 2019-11-16 ENCOUNTER — Inpatient Hospital Stay: Payer: BC Managed Care – PPO | Admitting: Anesthesiology

## 2019-11-16 ENCOUNTER — Encounter: Payer: Self-pay | Admitting: Orthopedic Surgery

## 2019-11-16 ENCOUNTER — Inpatient Hospital Stay: Payer: BC Managed Care – PPO

## 2019-11-16 ENCOUNTER — Other Ambulatory Visit: Payer: Self-pay

## 2019-11-16 ENCOUNTER — Inpatient Hospital Stay
Admission: RE | Admit: 2019-11-16 | Discharge: 2019-11-17 | DRG: 470 | Disposition: A | Discharge status: Home / Self Care | Payer: BC Managed Care – PPO | Attending: Orthopedic Surgery | Admitting: Orthopedic Surgery

## 2019-11-16 ENCOUNTER — Encounter
Admission: RE | Disposition: A | Discharge status: Home / Self Care | Payer: Self-pay | Source: Home / Self Care | Attending: Orthopedic Surgery

## 2019-11-16 DIAGNOSIS — Z87891 Personal history of nicotine dependence: Secondary | ICD-10-CM

## 2019-11-16 DIAGNOSIS — H409 Unspecified glaucoma: Secondary | ICD-10-CM | POA: Diagnosis present

## 2019-11-16 DIAGNOSIS — Z96651 Presence of right artificial knee joint: Secondary | ICD-10-CM | POA: Diagnosis present

## 2019-11-16 DIAGNOSIS — I10 Essential (primary) hypertension: Secondary | ICD-10-CM | POA: Diagnosis present

## 2019-11-16 DIAGNOSIS — F4321 Adjustment disorder with depressed mood: Secondary | ICD-10-CM | POA: Diagnosis present

## 2019-11-16 DIAGNOSIS — Z8249 Family history of ischemic heart disease and other diseases of the circulatory system: Secondary | ICD-10-CM | POA: Diagnosis not present

## 2019-11-16 DIAGNOSIS — E669 Obesity, unspecified: Secondary | ICD-10-CM | POA: Diagnosis present

## 2019-11-16 DIAGNOSIS — Z96659 Presence of unspecified artificial knee joint: Secondary | ICD-10-CM

## 2019-11-16 DIAGNOSIS — M25762 Osteophyte, left knee: Secondary | ICD-10-CM | POA: Diagnosis present

## 2019-11-16 DIAGNOSIS — Z23 Encounter for immunization: Secondary | ICD-10-CM | POA: Diagnosis not present

## 2019-11-16 DIAGNOSIS — K08109 Complete loss of teeth, unspecified cause, unspecified class: Secondary | ICD-10-CM | POA: Diagnosis present

## 2019-11-16 DIAGNOSIS — Z888 Allergy status to other drugs, medicaments and biological substances status: Secondary | ICD-10-CM

## 2019-11-16 DIAGNOSIS — M1712 Unilateral primary osteoarthritis, left knee: Secondary | ICD-10-CM | POA: Diagnosis present

## 2019-11-16 DIAGNOSIS — Z6831 Body mass index (BMI) 31.0-31.9, adult: Secondary | ICD-10-CM

## 2019-11-16 DIAGNOSIS — Z79899 Other long term (current) drug therapy: Secondary | ICD-10-CM | POA: Diagnosis not present

## 2019-11-16 DIAGNOSIS — Z87442 Personal history of urinary calculi: Secondary | ICD-10-CM

## 2019-11-16 DIAGNOSIS — Z933 Colostomy status: Secondary | ICD-10-CM

## 2019-11-16 HISTORY — PX: KNEE ARTHROPLASTY: SHX992

## 2019-11-16 LAB — TYPE AND SCREEN
ABO/RH(D): B POS
Antibody Screen: NEGATIVE

## 2019-11-16 LAB — URINE DRUG SCREEN, QUALITATIVE (ARMC ONLY)
Amphetamines, Ur Screen: NOT DETECTED
Barbiturates, Ur Screen: NOT DETECTED
Benzodiazepine, Ur Scrn: NOT DETECTED
Cannabinoid 50 Ng, Ur ~~LOC~~: POSITIVE — AB
Cocaine Metabolite,Ur ~~LOC~~: NOT DETECTED
MDMA (Ecstasy)Ur Screen: NOT DETECTED
Methadone Scn, Ur: NOT DETECTED
Opiate, Ur Screen: NOT DETECTED
Phencyclidine (PCP) Ur S: NOT DETECTED
Tricyclic, Ur Screen: NOT DETECTED

## 2019-11-16 SURGERY — ARTHROPLASTY, KNEE, TOTAL, USING IMAGELESS COMPUTER-ASSISTED NAVIGATION
Anesthesia: Spinal | Site: Knee | Laterality: Left

## 2019-11-16 MED ORDER — KETAMINE HCL 50 MG/ML IJ SOLN
INTRAMUSCULAR | Status: DC | PRN
  Administered 2019-11-16 (×2): 50 mg via INTRAMUSCULAR

## 2019-11-16 MED ORDER — ALUM & MAG HYDROXIDE-SIMETH 200-200-20 MG/5ML PO SUSP: 30.0000 mL | ORAL | Status: DC | PRN

## 2019-11-16 MED ORDER — SENNOSIDES-DOCUSATE SODIUM 8.6-50 MG PO TABS
1.0000 | ORAL_TABLET | Freq: Two times a day (BID) | ORAL | Status: DC
  Administered 2019-11-16: 1 via ORAL
  Filled 2019-11-16 (×3): qty 1

## 2019-11-16 MED ORDER — BUPIVACAINE HCL (PF) 0.5 % IJ SOLN
INTRAMUSCULAR | Status: DC | PRN
  Administered 2019-11-16: 3 mL via INTRATHECAL

## 2019-11-16 MED ORDER — BUPIVACAINE LIPOSOME 1.3 % IJ SUSP
INTRAMUSCULAR | Status: AC
  Filled 2019-11-16: qty 20

## 2019-11-16 MED ORDER — FERROUS SULFATE 325 (65 FE) MG PO TABS
325.0000 mg | ORAL_TABLET | Freq: Two times a day (BID) | ORAL | Status: DC
  Administered 2019-11-16: 325 mg via ORAL
  Filled 2019-11-16 (×2): qty 1

## 2019-11-16 MED ORDER — CELECOXIB 200 MG PO CAPS
200.0000 mg | ORAL_CAPSULE | Freq: Two times a day (BID) | ORAL | Status: DC
  Administered 2019-11-16 – 2019-11-17 (×2): 200 mg via ORAL
  Filled 2019-11-16 (×2): qty 1

## 2019-11-16 MED ORDER — DEXAMETHASONE SODIUM PHOSPHATE 10 MG/ML IJ SOLN: 8.0000 mg | Freq: Once | INTRAMUSCULAR | Status: AC

## 2019-11-16 MED ORDER — PROPOFOL 500 MG/50ML IV EMUL
INTRAVENOUS | Status: AC
  Filled 2019-11-16: qty 50

## 2019-11-16 MED ORDER — FAMOTIDINE 20 MG PO TABS
ORAL_TABLET | ORAL | Status: AC
  Administered 2019-11-16: 20 mg via ORAL
  Filled 2019-11-16: qty 1

## 2019-11-16 MED ORDER — PROPOFOL 10 MG/ML IV BOLUS
INTRAVENOUS | Status: DC | PRN
  Administered 2019-11-16: 40 mg via INTRAVENOUS

## 2019-11-16 MED ORDER — OXYCODONE HCL 5 MG PO TABS
5.0000 mg | ORAL_TABLET | ORAL | Status: DC | PRN
  Administered 2019-11-16 – 2019-11-17 (×2): 5 mg via ORAL
  Filled 2019-11-16 (×2): qty 1

## 2019-11-16 MED ORDER — LATANOPROST 0.005 % OP SOLN
1.0000 [drp] | Freq: Every day | OPHTHALMIC | Status: DC
  Administered 2019-11-16: 1 [drp] via OPHTHALMIC
  Filled 2019-11-16: qty 2.5

## 2019-11-16 MED ORDER — NEOMYCIN-POLYMYXIN B GU 40-200000 IR SOLN
Status: DC | PRN
  Administered 2019-11-16: 16 mL

## 2019-11-16 MED ORDER — DORZOLAMIDE HCL-TIMOLOL MAL 2-0.5 % OP SOLN
1.0000 [drp] | Freq: Two times a day (BID) | OPHTHALMIC | Status: DC
  Administered 2019-11-16 – 2019-11-17 (×2): 1 [drp] via OPHTHALMIC
  Filled 2019-11-16: qty 10

## 2019-11-16 MED ORDER — GABAPENTIN 300 MG PO CAPS: 300.0000 mg | ORAL_CAPSULE | Freq: Once | ORAL | Status: AC

## 2019-11-16 MED ORDER — MENTHOL 3 MG MT LOZG
1.0000 | LOZENGE | OROMUCOSAL | Status: DC | PRN
  Filled 2019-11-16: qty 9

## 2019-11-16 MED ORDER — DIPHENHYDRAMINE HCL 12.5 MG/5ML PO ELIX
12.5000 mg | ORAL_SOLUTION | ORAL | Status: DC | PRN
  Administered 2019-11-16: 25 mg via ORAL
  Filled 2019-11-16: qty 10

## 2019-11-16 MED ORDER — TRANEXAMIC ACID-NACL 1000-0.7 MG/100ML-% IV SOLN: 1000.0000 mg | Freq: Once | INTRAVENOUS | Status: AC

## 2019-11-16 MED ORDER — PROPOFOL 500 MG/50ML IV EMUL
INTRAVENOUS | Status: DC | PRN
  Administered 2019-11-16: 75 ug/kg/min via INTRAVENOUS

## 2019-11-16 MED ORDER — CEFAZOLIN SODIUM-DEXTROSE 2-4 GM/100ML-% IV SOLN
2.0000 g | INTRAVENOUS | Status: AC
  Administered 2019-11-16: 2 g via INTRAVENOUS

## 2019-11-16 MED ORDER — MAGNESIUM HYDROXIDE 400 MG/5ML PO SUSP
30.0000 mL | Freq: Every day | ORAL | Status: DC
  Filled 2019-11-16 (×2): qty 30

## 2019-11-16 MED ORDER — PHENOL 1.4 % MT LIQD
1.0000 | OROMUCOSAL | Status: DC | PRN
  Filled 2019-11-16: qty 177

## 2019-11-16 MED ORDER — CEFAZOLIN SODIUM-DEXTROSE 2-4 GM/100ML-% IV SOLN
2.0000 g | Freq: Four times a day (QID) | INTRAVENOUS | Status: AC
  Administered 2019-11-16 – 2019-11-17 (×4): 2 g via INTRAVENOUS
  Filled 2019-11-16 (×4): qty 100

## 2019-11-16 MED ORDER — SODIUM CHLORIDE 0.9 % IV SOLN
INTRAVENOUS | Status: DC | PRN
  Administered 2019-11-16: 60 mL

## 2019-11-16 MED ORDER — TRANEXAMIC ACID-NACL 1000-0.7 MG/100ML-% IV SOLN
INTRAVENOUS | Status: AC
  Administered 2019-11-16: 1000 mg via INTRAVENOUS
  Filled 2019-11-16: qty 100

## 2019-11-16 MED ORDER — KETAMINE HCL 50 MG/ML IJ SOLN
INTRAMUSCULAR | Status: AC
  Filled 2019-11-16: qty 10

## 2019-11-16 MED ORDER — INFLUENZA VAC SPLIT QUAD 0.5 ML IM SUSY
0.5000 mL | PREFILLED_SYRINGE | INTRAMUSCULAR | Status: AC
  Administered 2019-11-17: 0.5 mL via INTRAMUSCULAR
  Filled 2019-11-16: qty 0.5

## 2019-11-16 MED ORDER — FENTANYL CITRATE (PF) 100 MCG/2ML IJ SOLN
INTRAMUSCULAR | Status: AC
  Filled 2019-11-16: qty 2

## 2019-11-16 MED ORDER — PANTOPRAZOLE SODIUM 40 MG PO TBEC
40.0000 mg | DELAYED_RELEASE_TABLET | Freq: Two times a day (BID) | ORAL | Status: DC
  Administered 2019-11-16: 40 mg via ORAL
  Filled 2019-11-16 (×2): qty 1

## 2019-11-16 MED ORDER — GABAPENTIN 300 MG PO CAPS
ORAL_CAPSULE | ORAL | Status: AC
  Administered 2019-11-16: 300 mg via ORAL
  Filled 2019-11-16: qty 1

## 2019-11-16 MED ORDER — ENOXAPARIN SODIUM 30 MG/0.3ML ~~LOC~~ SOLN
30.0000 mg | Freq: Two times a day (BID) | SUBCUTANEOUS | Status: DC
  Administered 2019-11-17 (×2): 30 mg via SUBCUTANEOUS
  Filled 2019-11-16 (×2): qty 0.3

## 2019-11-16 MED ORDER — ONDANSETRON HCL 4 MG/2ML IJ SOLN
INTRAMUSCULAR | Status: DC | PRN
  Administered 2019-11-16: 4 mg via INTRAVENOUS

## 2019-11-16 MED ORDER — ACETAMINOPHEN 10 MG/ML IV SOLN
INTRAVENOUS | Status: AC
  Filled 2019-11-16: qty 100

## 2019-11-16 MED ORDER — LACTATED RINGERS IV SOLN: INTRAVENOUS | Status: DC

## 2019-11-16 MED ORDER — ONDANSETRON HCL 4 MG/2ML IJ SOLN
INTRAMUSCULAR | Status: AC
  Filled 2019-11-16: qty 2

## 2019-11-16 MED ORDER — OXYCODONE HCL 5 MG PO TABS: 10.0000 mg | ORAL_TABLET | ORAL | Status: DC | PRN

## 2019-11-16 MED ORDER — TRANEXAMIC ACID-NACL 1000-0.7 MG/100ML-% IV SOLN
1000.0000 mg | INTRAVENOUS | Status: AC
  Administered 2019-11-16: 1000 mg via INTRAVENOUS

## 2019-11-16 MED ORDER — BUPIVACAINE HCL (PF) 0.25 % IJ SOLN
INTRAMUSCULAR | Status: AC
  Filled 2019-11-16: qty 60

## 2019-11-16 MED ORDER — ACETAMINOPHEN 10 MG/ML IV SOLN
INTRAVENOUS | Status: DC | PRN
  Administered 2019-11-16: 1000 mg via INTRAVENOUS

## 2019-11-16 MED ORDER — ONDANSETRON HCL 4 MG PO TABS: 4.0000 mg | ORAL_TABLET | Freq: Four times a day (QID) | ORAL | Status: DC | PRN

## 2019-11-16 MED ORDER — CELECOXIB 200 MG PO CAPS: 400.0000 mg | ORAL_CAPSULE | Freq: Once | ORAL | Status: AC

## 2019-11-16 MED ORDER — BISACODYL 10 MG RE SUPP: 10.0000 mg | Freq: Every day | RECTAL | Status: DC | PRN

## 2019-11-16 MED ORDER — ACETAMINOPHEN 325 MG PO TABS: 325.0000 mg | ORAL_TABLET | Freq: Four times a day (QID) | ORAL | Status: DC | PRN

## 2019-11-16 MED ORDER — BUPIVACAINE HCL (PF) 0.25 % IJ SOLN
INTRAMUSCULAR | Status: DC | PRN
  Administered 2019-11-16: 60 mL

## 2019-11-16 MED ORDER — METOCLOPRAMIDE HCL 10 MG PO TABS
10.0000 mg | ORAL_TABLET | Freq: Three times a day (TID) | ORAL | Status: DC
  Administered 2019-11-16 (×2): 10 mg via ORAL
  Filled 2019-11-16 (×3): qty 1

## 2019-11-16 MED ORDER — ONDANSETRON HCL 4 MG/2ML IJ SOLN: 4.0000 mg | Freq: Once | INTRAMUSCULAR | Status: DC | PRN

## 2019-11-16 MED ORDER — HYDROMORPHONE HCL 1 MG/ML IJ SOLN
0.5000 mg | INTRAMUSCULAR | Status: DC | PRN
  Filled 2019-11-16: qty 1

## 2019-11-16 MED ORDER — SODIUM CHLORIDE 0.9 % IV SOLN: INTRAVENOUS | Status: DC

## 2019-11-16 MED ORDER — ACETAMINOPHEN 10 MG/ML IV SOLN
1000.0000 mg | Freq: Four times a day (QID) | INTRAVENOUS | Status: AC
  Administered 2019-11-16 – 2019-11-17 (×4): 1000 mg via INTRAVENOUS
  Filled 2019-11-16 (×4): qty 100

## 2019-11-16 MED ORDER — FLEET ENEMA 7-19 GM/118ML RE ENEM: 1.0000 | ENEMA | Freq: Once | RECTAL | Status: DC | PRN

## 2019-11-16 MED ORDER — SODIUM CHLORIDE FLUSH 0.9 % IV SOLN
INTRAVENOUS | Status: AC
  Filled 2019-11-16: qty 40

## 2019-11-16 MED ORDER — METOCLOPRAMIDE HCL 5 MG/ML IJ SOLN: 5.0000 mg | Freq: Three times a day (TID) | INTRAMUSCULAR | Status: DC | PRN

## 2019-11-16 MED ORDER — CHLORHEXIDINE GLUCONATE 4 % EX LIQD: 60.0000 mL | Freq: Once | CUTANEOUS | Status: DC

## 2019-11-16 MED ORDER — CELECOXIB 200 MG PO CAPS
ORAL_CAPSULE | ORAL | Status: AC
  Administered 2019-11-16: 400 mg via ORAL
  Filled 2019-11-16: qty 2

## 2019-11-16 MED ORDER — METOCLOPRAMIDE HCL 10 MG PO TABS: 5.0000 mg | ORAL_TABLET | Freq: Three times a day (TID) | ORAL | Status: DC | PRN

## 2019-11-16 MED ORDER — LOSARTAN POTASSIUM 50 MG PO TABS
100.0000 mg | ORAL_TABLET | Freq: Every day | ORAL | Status: DC
  Administered 2019-11-16 – 2019-11-17 (×2): 100 mg via ORAL
  Filled 2019-11-16 (×2): qty 2

## 2019-11-16 MED ORDER — DEXAMETHASONE SODIUM PHOSPHATE 10 MG/ML IJ SOLN
INTRAMUSCULAR | Status: AC
  Administered 2019-11-16: 8 mg via INTRAVENOUS
  Filled 2019-11-16: qty 1

## 2019-11-16 MED ORDER — CEFAZOLIN SODIUM-DEXTROSE 2-4 GM/100ML-% IV SOLN
INTRAVENOUS | Status: AC
  Filled 2019-11-16: qty 100

## 2019-11-16 MED ORDER — FENTANYL CITRATE (PF) 100 MCG/2ML IJ SOLN
INTRAMUSCULAR | Status: DC | PRN
  Administered 2019-11-16 (×2): 25 ug via INTRAVENOUS

## 2019-11-16 MED ORDER — FENTANYL CITRATE (PF) 100 MCG/2ML IJ SOLN: 25.0000 ug | INTRAMUSCULAR | Status: DC | PRN

## 2019-11-16 MED ORDER — MIDAZOLAM HCL 2 MG/2ML IJ SOLN
INTRAMUSCULAR | Status: AC
  Filled 2019-11-16: qty 2

## 2019-11-16 MED ORDER — GABAPENTIN 300 MG PO CAPS
300.0000 mg | ORAL_CAPSULE | Freq: Every day | ORAL | Status: DC
  Administered 2019-11-17: 300 mg via ORAL
  Filled 2019-11-16 (×2): qty 1

## 2019-11-16 MED ORDER — TRANEXAMIC ACID-NACL 1000-0.7 MG/100ML-% IV SOLN
INTRAVENOUS | Status: AC
  Filled 2019-11-16: qty 100

## 2019-11-16 MED ORDER — TRAMADOL HCL 50 MG PO TABS
50.0000 mg | ORAL_TABLET | ORAL | Status: DC | PRN
  Administered 2019-11-16: 100 mg via ORAL
  Filled 2019-11-16: qty 2

## 2019-11-16 MED ORDER — VENLAFAXINE HCL ER 75 MG PO CP24
225.0000 mg | ORAL_CAPSULE | Freq: Every day | ORAL | Status: DC
  Administered 2019-11-16 – 2019-11-17 (×2): 225 mg via ORAL
  Filled 2019-11-16 (×2): qty 1

## 2019-11-16 MED ORDER — MIDAZOLAM HCL 5 MG/5ML IJ SOLN
INTRAMUSCULAR | Status: DC | PRN
  Administered 2019-11-16: 2 mg via INTRAVENOUS

## 2019-11-16 MED ORDER — DEXAMETHASONE SODIUM PHOSPHATE 10 MG/ML IJ SOLN
INTRAMUSCULAR | Status: AC
  Filled 2019-11-16: qty 1

## 2019-11-16 MED ORDER — ONDANSETRON HCL 4 MG/2ML IJ SOLN: 4.0000 mg | Freq: Four times a day (QID) | INTRAMUSCULAR | Status: DC | PRN

## 2019-11-16 MED ORDER — ENSURE ENLIVE PO LIQD: 296.0000 mL | Freq: Once | ORAL | Status: DC

## 2019-11-16 MED ORDER — DEXAMETHASONE SODIUM PHOSPHATE 10 MG/ML IJ SOLN
INTRAMUSCULAR | Status: DC | PRN
  Administered 2019-11-16: 10 mg via INTRAVENOUS

## 2019-11-16 MED ORDER — FAMOTIDINE 20 MG PO TABS: 20.0000 mg | ORAL_TABLET | Freq: Once | ORAL | Status: AC

## 2019-11-16 SURGICAL SUPPLY — 76 items
ATTUNE MED DOME PAT 38 KNEE (Knees) ×1 IMPLANT
ATTUNE PS FEM LT SZ 7 CEM KNEE (Femur) ×1 IMPLANT
ATTUNE PSRP INSR SZ7 5 KNEE (Insert) ×1 IMPLANT
BASE TIBIAL ROT PLAT SZ 7 KNEE (Knees) IMPLANT
BATTERY INSTRU NAVIGATION (MISCELLANEOUS) ×8 IMPLANT
BLADE SAW 70X12.5 (BLADE) ×2 IMPLANT
BLADE SAW 90X13X1.19 OSCILLAT (BLADE) ×2 IMPLANT
BLADE SAW 90X25X1.19 OSCILLAT (BLADE) ×2 IMPLANT
CANISTER SUCT 3000ML PPV (MISCELLANEOUS) ×2 IMPLANT
CEMENT HV SMART SET (Cement) ×2 IMPLANT
COOLER POLAR GLACIER W/PUMP (MISCELLANEOUS) ×2 IMPLANT
COVER WAND RF STERILE (DRAPES) ×2 IMPLANT
CUFF TOURN SGL QUICK 30 (TOURNIQUET CUFF) ×1
CUFF TRNQT CYL 30X4X21-28X (TOURNIQUET CUFF) IMPLANT
DRAPE 3/4 80X56 (DRAPES) ×2 IMPLANT
DRSG DERMACEA 8X12 NADH (GAUZE/BANDAGES/DRESSINGS) ×2 IMPLANT
DRSG OPSITE POSTOP 4X12 (GAUZE/BANDAGES/DRESSINGS) ×1 IMPLANT
DRSG OPSITE POSTOP 4X14 (GAUZE/BANDAGES/DRESSINGS) ×2 IMPLANT
DRSG TEGADERM 4X4.75 (GAUZE/BANDAGES/DRESSINGS) ×2 IMPLANT
DURAPREP 26ML APPLICATOR (WOUND CARE) ×4 IMPLANT
ELECT REM PT RETURN 9FT ADLT (ELECTROSURGICAL) ×2
ELECTRODE REM PT RTRN 9FT ADLT (ELECTROSURGICAL) ×1 IMPLANT
EX-PIN ORTHOLOCK NAV 4X150 (PIN) ×4 IMPLANT
GLOVE BIO SURGEON STRL SZ7.5 (GLOVE) ×4 IMPLANT
GLOVE BIOGEL M STRL SZ7.5 (GLOVE) ×4 IMPLANT
GLOVE BIOGEL PI IND STRL 7.5 (GLOVE) ×1 IMPLANT
GLOVE BIOGEL PI INDICATOR 7.5 (GLOVE) ×1
GLOVE INDICATOR 8.0 STRL GRN (GLOVE) ×2 IMPLANT
GOWN STRL REUS W/ TWL LRG LVL3 (GOWN DISPOSABLE) ×2 IMPLANT
GOWN STRL REUS W/ TWL XL LVL3 (GOWN DISPOSABLE) ×1 IMPLANT
GOWN STRL REUS W/TWL LRG LVL3 (GOWN DISPOSABLE) ×2
GOWN STRL REUS W/TWL XL LVL3 (GOWN DISPOSABLE) ×1
HEMOVAC 400CC 10FR (MISCELLANEOUS) ×2 IMPLANT
HOLDER FOLEY CATH W/STRAP (MISCELLANEOUS) ×2 IMPLANT
HOOD PEEL AWAY FLYTE STAYCOOL (MISCELLANEOUS) ×4 IMPLANT
KIT TURNOVER KIT A (KITS) ×2 IMPLANT
KNIFE SCULPS 14X20 (INSTRUMENTS) ×2 IMPLANT
LABEL OR SOLS (LABEL) ×2 IMPLANT
MANIFOLD NEPTUNE II (INSTRUMENTS) ×2 IMPLANT
NDL SAFETY ECLIPSE 18X1.5 (NEEDLE) ×1 IMPLANT
NDL SPNL 20GX3.5 QUINCKE YW (NEEDLE) ×2 IMPLANT
NEEDLE HYPO 18GX1.5 SHARP (NEEDLE) ×1
NEEDLE SPNL 20GX3.5 QUINCKE YW (NEEDLE) ×4 IMPLANT
NS IRRIG 500ML POUR BTL (IV SOLUTION) ×2 IMPLANT
PACK TOTAL KNEE (MISCELLANEOUS) ×2 IMPLANT
PAD ABD DERMACEA PRESS 5X9 (GAUZE/BANDAGES/DRESSINGS) ×1 IMPLANT
PAD CAST CTTN 4X4 STRL (SOFTGOODS) IMPLANT
PAD WRAPON POLAR KNEE (MISCELLANEOUS) ×1 IMPLANT
PADDING CAST COTTON 4X4 STRL (SOFTGOODS) ×1
PENCIL SMOKE EVACUATOR COATED (MISCELLANEOUS) ×2 IMPLANT
PENCIL SMOKE ULTRAEVAC 22 CON (MISCELLANEOUS) ×2 IMPLANT
PIN DRILL QUICK PACK ×2 IMPLANT
PIN FIXATION 1/8DIA X 3INL (PIN) ×6 IMPLANT
PULSAVAC PLUS IRRIG FAN TIP (DISPOSABLE) ×2
SOL .9 NS 3000ML IRR  AL (IV SOLUTION) ×1
SOL .9 NS 3000ML IRR UROMATIC (IV SOLUTION) ×1 IMPLANT
SOL PREP PVP 2OZ (MISCELLANEOUS) ×2
SOLUTION PREP PVP 2OZ (MISCELLANEOUS) ×1 IMPLANT
SPONGE DRAIN TRACH 4X4 STRL 2S (GAUZE/BANDAGES/DRESSINGS) ×2 IMPLANT
STAPLER SKIN PROX 35W (STAPLE) ×2 IMPLANT
STOCKINETTE BIAS CUT 6 980064 (GAUZE/BANDAGES/DRESSINGS) ×1 IMPLANT
STOCKINETTE IMPERV 14X48 (MISCELLANEOUS) ×1 IMPLANT
STRAP TIBIA SHORT (MISCELLANEOUS) ×2 IMPLANT
SUCTION FRAZIER HANDLE 10FR (MISCELLANEOUS) ×1
SUCTION TUBE FRAZIER 10FR DISP (MISCELLANEOUS) ×1 IMPLANT
SUT VIC AB 0 CT1 36 (SUTURE) ×4 IMPLANT
SUT VIC AB 1 CT1 36 (SUTURE) ×4 IMPLANT
SUT VIC AB 2-0 CT2 27 (SUTURE) ×2 IMPLANT
SYR 20ML LL LF (SYRINGE) ×2 IMPLANT
SYR 30ML LL (SYRINGE) ×4 IMPLANT
TIBIAL BASE ROT PLAT SZ 7 KNEE (Knees) ×2 IMPLANT
TIP FAN IRRIG PULSAVAC PLUS (DISPOSABLE) ×1 IMPLANT
TOWEL OR 17X26 4PK STRL BLUE (TOWEL DISPOSABLE) ×2 IMPLANT
TOWER CARTRIDGE SMART MIX (DISPOSABLE) ×2 IMPLANT
TRAY FOLEY MTR SLVR 16FR STAT (SET/KITS/TRAYS/PACK) ×2 IMPLANT
WRAPON POLAR PAD KNEE (MISCELLANEOUS) ×2

## 2019-11-16 NOTE — Progress Notes (Signed)
Patient able to feed self, well POD 0 for Left knee replacement, tolerating diet without difficulty . Given PRN ultram 100mg  for breakthrough pain

## 2019-11-16 NOTE — Anesthesia Preprocedure Evaluation (Signed)
Anesthesia Evaluation  Patient identified by MRN, date of birth, ID band Patient awake    Reviewed: Allergy & Precautions, NPO status , Patient's Chart, lab work & pertinent test results  History of Anesthesia Complications Negative for: history of anesthetic complications  Airway Mallampati: II  TM Distance: >3 FB Neck ROM: Full    Dental  (+) Edentulous Upper, Edentulous Lower   Pulmonary neg sleep apnea, neg COPD, Current Smoker and Patient abstained from smoking.,    breath sounds clear to auscultation- rhonchi (-) wheezing      Cardiovascular hypertension, Pt. on medications (-) CAD, (-) Past MI, (-) Cardiac Stents and (-) CABG  Rhythm:Regular Rate:Normal - Systolic murmurs and - Diastolic murmurs    Neuro/Psych neg Seizures PSYCHIATRIC DISORDERS Depression negative neurological ROS     GI/Hepatic negative GI ROS, Neg liver ROS,   Endo/Other  negative endocrine ROSneg diabetes  Renal/GU Renal disease: hx of nephrolithiasis.     Musculoskeletal  (+) Arthritis ,   Abdominal (+) + obese,   Peds  Hematology negative hematology ROS (+)   Anesthesia Other Findings Past Medical History: No date: Degenerative arthritis of knee No date: Depression No date: Diverticulitis No date: Glaucoma     Comment:  since 2004 1979: Hernia No date: History of kidney stones No date: Hypertension No date: Personal history of tobacco use, presenting hazards to health No date: Pre-diabetes No date: Screening for obesity No date: Special screening for malignant neoplasms, colon   Reproductive/Obstetrics                             Lab Results  Component Value Date   WBC 7.7 10/22/2019   HGB 14.9 10/22/2019   HCT 39.7 10/22/2019   MCV 93.2 10/22/2019   PLT 219 10/22/2019    Anesthesia Physical Anesthesia Plan  ASA: II  Anesthesia Plan: Spinal   Post-op Pain Management:    Induction:    PONV Risk Score and Plan: 0 and Propofol infusion  Airway Management Planned: Natural Airway  Additional Equipment:   Intra-op Plan:   Post-operative Plan:   Informed Consent: I have reviewed the patients History and Physical, chart, labs and discussed the procedure including the risks, benefits and alternatives for the proposed anesthesia with the patient or authorized representative who has indicated his/her understanding and acceptance.     Dental advisory given  Plan Discussed with: CRNA and Anesthesiologist  Anesthesia Plan Comments:         Anesthesia Quick Evaluation

## 2019-11-16 NOTE — Transfer of Care (Signed)
Immediate Anesthesia Transfer of Care Note  Patient: Rodney Vega  Procedure(s) Performed: COMPUTER ASSISTED TOTAL KNEE ARTHROPLASTY (Left Knee)  Patient Location: PACU  Anesthesia Type:Spinal  Level of Consciousness: awake  Airway & Oxygen Therapy: Patient connected to face mask oxygen  Post-op Assessment: Post -op Vital signs reviewed and stable  Post vital signs: stable  Last Vitals:  Vitals Value Taken Time  BP 134/81 11/16/19 1113  Temp    Pulse 74 11/16/19 1115  Resp 13 11/16/19 1115  SpO2 99 % 11/16/19 1115  Vitals shown include unvalidated device data.  Last Pain:  Vitals:   11/16/19 0634  TempSrc: Tympanic  PainSc: 0-No pain         Complications: No apparent anesthesia complications

## 2019-11-16 NOTE — H&P (Signed)
The patient has been re-examined, and the chart reviewed, and there have been no interval changes to the documented history and physical.    The risks, benefits, and alternatives have been discussed at length. The patient expressed understanding of the risks benefits and agreed with plans for surgical intervention.  Damaso Laday P. Aleksei Goodlin, Jr. M.D.    

## 2019-11-16 NOTE — Evaluation (Signed)
Occupational Therapy Evaluation Patient Details Name: Rodney Vega MRN: [id number] DOB: October 25, 1955 Today's Date: 11/16/2019    History of Present Illness Pt admitted for L TKR. History of previous R TKR in 2019.    Clinical Impression   Pt seen for OT evaluation this date, POD#1 from above surgery. Pt was independent in all ADL prior to surgery and is eager to return to PLOF with less pain and improved safety and independence. Pt currently requires PRN minimal assist for LB dressing and bathing while in seated position due to pain and limited AROM of L knee. Pt/spouse instructed in polar care mgt, falls prevention strategies, home/routines modifications, DME/AE for LB bathing and dressing tasks, and compression stocking mgt. Pt/spouse verbalized understanding. CGA for ADL transfer, pt able to manage urinal with supervision to CGA for static standing with occasional UE support on RW. No LOB noted. All education/training provided at time of evaluation. Do not anticipate need for additional skilled OT services. Will sign off. Pt/spouse in agreement.       Follow Up Recommendations  No OT follow up    Equipment Recommendations  None recommended by OT    Recommendations for Other Services       Precautions / Restrictions Precautions Precautions: Fall;Knee Precaution Booklet Issued: No Restrictions Weight Bearing Restrictions: Yes LLE Weight Bearing: Weight bearing as tolerated      Mobility Bed Mobility Overal bed mobility: Needs Assistance Bed Mobility: Supine to Sit     Supine to sit: Min guard     General bed mobility comments: deferred, up in recliner at start and end of session  Transfers Overall transfer level: Needs assistance Equipment used: Rolling walker (2 wheeled) Transfers: Sit to/from Stand Sit to Stand: Min guard         General transfer comment: upon initial standing, upright posture noted with slight LOB with post leaning, able to self correct     Balance Overall balance assessment: Needs assistance Sitting-balance support: Feet supported Sitting balance-Leahy Scale: Good     Standing balance support: Single extremity supported;During functional activity Standing balance-Leahy Scale: Good                             ADL either performed or assessed with clinical judgement   ADL Overall ADL's : Needs assistance/impaired                                       General ADL Comments: PRN Min A for LB ADL and mod A for compression stocking mgt 2/2 decreased strength/ROM of L knee; spouse able to provide needed level of assist     Vision Baseline Vision/History: Glaucoma Patient Visual Report: No change from baseline       Perception     Praxis      Pertinent Vitals/Pain Pain Assessment: No/denies pain     Hand Dominance Right   Extremity/Trunk Assessment Upper Extremity Assessment Upper Extremity Assessment: Overall WFL for tasks assessed   Lower Extremity Assessment Lower Extremity Assessment: (L LE grossly 4/5; R LE grossly 5/5)   Cervical / Trunk Assessment Cervical / Trunk Assessment: Normal   Communication Communication Communication: No difficulties   Cognition Arousal/Alertness: Awake/alert Behavior During Therapy: WFL for tasks assessed/performed Overall Cognitive Status: Within Functional Limits for tasks assessed  General Comments       Exercises  Other Exercises: pt/spouse instructed in compression stocking mgt, polar acre mgt, falls prevention, home/routines modifications and AE/DME for ADL tasks; handout provided   Shoulder Instructions      Home Living Family/patient expects to be discharged to:: Private residence Living Arrangements: Spouse/significant other Available Help at Discharge: Family;Available 24 hours/day Type of Home: House Home Access: Stairs to enter [employer] of Steps: 5 Entrance  Stairs-Rails: Can reach both Home Layout: One level     Bathroom Shower/Tub: Occupational psychologist: Standard     Home Equipment: Environmental consultant - 2 wheels;Bedside commode;Shower seat - built Education officer, museum        Prior Functioning/Environment Level of Independence: Independent        Comments: driving and indep with all ADLs prior to admission        OT Problem List: Decreased strength;Decreased range of motion      OT Treatment/Interventions:      OT Goals(Current goals can be found in the care plan section) Acute Rehab OT Goals Patient Stated Goal: to go home OT Goal Formulation: All assessment and education complete, DC therapy  OT Frequency:     Barriers to D/C:            Co-evaluation              AM-PAC OT "6 Clicks" Daily Activity     Outcome Measure Help from another person eating meals?: None Help from another person taking care of personal grooming?: None Help from another person toileting, which includes using toliet, bedpan, or urinal?: A Little Help from another person bathing (including washing, rinsing, drying)?: A Little Help from another person to put on and taking off regular upper body clothing?: None Help from another person to put on and taking off regular lower body clothing?: A Little 6 Click Score: 21   End of Session Equipment Utilized During Treatment: Rolling walker Nurse Communication: Other (comment)(pt requesting bone foam)  Activity Tolerance: Patient tolerated treatment well Patient left: in chair;with call bell/phone within reach;with chair alarm set;with family/visitor present;with SCD's reapplied;Other (comment)(polar care and hemovac in place)  OT Visit Diagnosis: Other abnormalities of gait and mobility (R26.89)                Time: 8937-3428 OT Time Calculation (min): 36 min Charges:  OT General Charges $OT Visit: 1 Visit OT Evaluation $OT Eval Low Complexity: 1 Low OT  Treatments $Self Care/Home Management : 23-37 mins  Jeni Salles, MPH, MS, OTR/L ascom [phone] 11/16/19, 4:55 PM

## 2019-11-16 NOTE — Op Note (Signed)
OPERATIVE NOTE  DATE OF SURGERY:  11/16/2019  PATIENT NAME:  Rodney Vega   DOB: [birth year]  MRN: [id number]  PRE-OPERATIVE DIAGNOSIS: Degenerative arthrosis of the left knee, primary  POST-OPERATIVE DIAGNOSIS:  Same  PROCEDURE:  Left total knee arthroplasty using computer-assisted navigation  SURGEON:  Marciano Sequin. M.D.  ASSISTANT: Cassell Smiles, PA-C (present and scrubbed throughout the case, critical for assistance with exposure, retraction, instrumentation, and closure)  ANESTHESIA: spinal  ESTIMATED BLOOD LOSS: 50 mL  FLUIDS REPLACED: 1200 mL of crystalloid  TOURNIQUET TIME: 88 minutes  DRAINS: 2 medium Hemovac drains  SOFT TISSUE RELEASES: Anterior cruciate ligament, posterior cruciate ligament, deep medial collateral ligament, patellofemoral ligament  IMPLANTS UTILIZED: DePuy Attune size 7 posterior stabilized femoral component (cemented), size 7 rotating platform tibial component (cemented), 38 mm medialized dome patella (cemented), and a 5 mm stabilized rotating platform polyethylene insert.  INDICATIONS FOR SURGERY: Rodney Vega is a 64 y.o. year old male with a long history of progressive knee pain. X-rays demonstrated severe degenerative changes in tricompartmental fashion. The patient had not seen any significant improvement despite conservative nonsurgical intervention. After discussion of the risks and benefits of surgical intervention, the patient expressed understanding of the risks benefits and agree with plans for total knee arthroplasty.   The risks, benefits, and alternatives were discussed at length including but not limited to the risks of infection, bleeding, nerve injury, stiffness, blood clots, the need for revision surgery, cardiopulmonary complications, among others, and they were willing to proceed.  PROCEDURE IN DETAIL: The patient was brought into the operating room and, after adequate spinal anesthesia was achieved, a tourniquet was placed on the  patient's upper thigh. The patient's knee and leg were cleaned and prepped with alcohol and DuraPrep and draped in the usual sterile fashion. A "timeout" was performed as per usual protocol. The lower extremity was exsanguinated using an Esmarch, and the tourniquet was inflated to 300 mmHg. An anterior longitudinal incision was made followed by a standard mid vastus approach. The deep fibers of the medial collateral ligament were elevated in a subperiosteal fashion off of the medial flare of the tibia so as to maintain a continuous soft tissue sleeve. The patella was subluxed laterally and the patellofemoral ligament was incised. Inspection of the knee demonstrated severe degenerative changes with full-thickness loss of articular cartilage. Osteophytes were debrided using a rongeur. Anterior and posterior cruciate ligaments were excised. Two 4.0 mm Schanz pins were inserted in the femur and into the tibia for attachment of the array of trackers used for computer-assisted navigation. Hip center was identified using a circumduction technique. Distal landmarks were mapped using the computer. The distal femur and proximal tibia were mapped using the computer. The distal femoral cutting guide was positioned using computer-assisted navigation so as to achieve a 5 distal valgus cut. The femur was sized and it was felt that a size 7 femoral component was appropriate. A size 7 femoral cutting guide was positioned and the anterior cut was performed and verified using the computer. This was followed by completion of the posterior and chamfer cuts. Femoral cutting guide for the central box was then positioned in the center box cut was performed.  Attention was then directed to the proximal tibia. Medial and lateral menisci were excised. The extramedullary tibial cutting guide was positioned using computer-assisted navigation so as to achieve a 0 varus-valgus alignment and 3 posterior slope. The cut was performed and  verified using the computer. The proximal tibia was sized and  it was felt that a size 7 tibial tray was appropriate. Tibial and femoral trials were inserted followed by insertion of a 5 mm polyethylene insert. This allowed for excellent mediolateral soft tissue balancing both in flexion and in full extension. Finally, the patella was cut and prepared so as to accommodate a 38 mm medialized dome patella. A patella trial was placed and the knee was placed through a range of motion with excellent patellar tracking appreciated. The femoral trial was removed after debridement of posterior osteophytes. The central post-hole for the tibial component was reamed followed by insertion of a keel punch. Tibial trials were then removed. Cut surfaces of bone were irrigated with copious amounts of normal saline with antibiotic solution using pulsatile lavage and then suctioned dry. Polymethylmethacrylate cement was prepared in the usual fashion using a vacuum mixer. Cement was applied to the cut surface of the proximal tibia as well as along the undersurface of a size 7 rotating platform tibial component. Tibial component was positioned and impacted into place. Excess cement was removed using [employer] fast streamer. Cement was then applied to the cut surfaces of the femur as well as along the posterior flanges of the size 7 femoral component. The femoral component was positioned and impacted into place. Excess cement was removed using [employer] fast streamer. A 5 mm polyethylene trial was inserted and the knee was brought into full extension with steady axial compression applied. Finally, cement was applied to the backside of a 38 mm medialized dome patella and the patellar component was positioned and patellar clamp applied. Excess cement was removed using [employer] fast streamer. After adequate curing of the cement, the tourniquet was deflated after a total tourniquet time of 88 minutes. Hemostasis was achieved using electrocautery. The knee was  irrigated with copious amounts of normal saline with antibiotic solution using pulsatile lavage and then suctioned dry. 20 mL of 1.3% Exparel and 60 mL of 0.25% Marcaine in 40 mL of normal saline was injected along the posterior capsule, medial and lateral gutters, and along the arthrotomy site. A 5 mm stabilized rotating platform polyethylene insert was inserted and the knee was placed through a range of motion with excellent mediolateral soft tissue balancing appreciated and excellent patellar tracking noted. 2 medium drains were placed in the wound bed and brought out through separate stab incisions. The medial parapatellar portion of the incision was reapproximated using interrupted sutures of #1 Vicryl. Subcutaneous tissue was approximated in layers using first #0 Vicryl followed #2-0 Vicryl. The skin was approximated with skin staples. A sterile dressing was applied.  The patient tolerated the procedure well and was transported to the recovery room in stable condition.    Soloman Mckeithan P. Holley Bouche., M.D.

## 2019-11-16 NOTE — Anesthesia Procedure Notes (Addendum)
Spinal  Patient location during procedure: OR Start time: 11/16/2019 7:21 AM End time: 11/16/2019 7:27 AM Staffing Performed: resident/CRNA  Anesthesiologist: Emmie Niemann, MD Resident/CRNA: Aline Brochure, CRNA Preanesthetic Checklist Completed: patient identified, IV checked, site marked, risks and benefits discussed, surgical consent, monitors and equipment checked, pre-op evaluation and timeout performed Spinal Block Patient position: sitting Prep: ChloraPrep Patient monitoring: heart rate, continuous pulse ox and blood pressure Approach: midline Location: L4-5 Injection technique: single-shot Needle Needle type: Introducer and Pencil-Tip  Needle gauge: 24 G Needle length: 9 cm Additional Notes Negative paresthesia. Negative blood return. Positive free-flowing CSF. Expiration date of kit checked and confirmed. Patient tolerated procedure well, without complications.

## 2019-11-16 NOTE — Evaluation (Signed)
Physical Therapy Evaluation Patient Details Name: Rodney Vega MRN: [id number] DOB: 02/01/1956 Today's Date: 11/16/2019   History of Present Illness  Pt admitted for L TKR. History of previous R TKR in 2019.   Clinical Impression  Pt is a pleasant 64 year old male who was admitted for L TKR. Pt performs bed mobility, transfers, and ambulation with cga and RW. Pt demonstrates ability to perform 10 SLRs with independence, therefore does not require KI for mobility. Pt demonstrates deficits with strength/mobility/pain. Pt is motivated to perform therapy. Would benefit from skilled PT to address above deficits and promote optimal return to PLOF. Recommend transition to [place] upon discharge from acute hospitalization.     Follow Up Recommendations Home health PT    Equipment Recommendations  None recommended by PT    Recommendations for Other Services       Precautions / Restrictions Precautions Precautions: Fall;Knee Precaution Booklet Issued: No Restrictions Weight Bearing Restrictions: Yes LLE Weight Bearing: Weight bearing as tolerated      Mobility  Bed Mobility Overal bed mobility: Needs Assistance Bed Mobility: Supine to Sit     Supine to sit: Min guard     General bed mobility comments: safe technique with ease of transition. ONce seated at EOB, upright posture noted  Transfers Overall transfer level: Needs assistance Equipment used: Rolling walker (2 wheeled) Transfers: Sit to/from Stand Sit to Stand: Min guard         General transfer comment: upon initial standing, upright posture noted with slight LOB with post leaning, able to self correct  Ambulation/Gait Ambulation/Gait assistance: Min guard Gait Distance (Feet): 40 Feet Assistive device: Rolling walker (2 wheeled) Gait Pattern/deviations: Step-to pattern     General Gait Details: ambulated in room with RW with safe technique. Noted slight buckling in L knee, however improves with cues for keeping  knee straight.  Stairs            Wheelchair Mobility    Modified Rankin (Stroke Patients Only)       Balance Overall balance assessment: Needs assistance Sitting-balance support: Feet supported Sitting balance-Leahy Scale: Good     Standing balance support: Bilateral upper extremity supported Standing balance-Leahy Scale: Good                               Pertinent Vitals/Pain Pain Assessment: No/denies pain    Home Living Family/patient expects to be discharged to:: Private residence Living Arrangements: Spouse/significant other Available Help at Discharge: Family;Available 24 hours/day Type of Home: House Home Access: Stairs to enter Entrance Stairs-Rails: Can reach both Entrance Stairs-Number of Steps: 5 Home Layout: One level Home Equipment: Walker - 2 wheels;Bedside commode      Prior Function Level of Independence: Independent         Comments: driving and indep with all ADLs prior to admission     Hand Dominance        Extremity/Trunk Assessment   Upper Extremity Assessment Upper Extremity Assessment: Overall WFL for tasks assessed    Lower Extremity Assessment Lower Extremity Assessment: Generalized weakness(L LE grossly 4/5; R LE grossly 5/5)       Communication   Communication: No difficulties  Cognition Arousal/Alertness: Awake/alert Behavior During Therapy: WFL for tasks assessed/performed Overall Cognitive Status: Within Functional Limits for tasks assessed  General Comments      Exercises Total Joint Exercises Goniometric ROM: L knee AAROM: 0-85 degrees Other Exercises Other Exercises: supine ther-ex performed on L LE including AP, SLRs, hip abd/add, and quad sets. All ther-ex performed x 10 reps with L LE with supervision   Assessment/Plan    PT Assessment Patient needs continued PT services  PT Problem List Decreased strength;Decreased range of  motion;Decreased balance;Decreased mobility;Decreased knowledge of use of DME;Pain       PT Treatment Interventions DME instruction;Gait training;Stair training;Therapeutic exercise;Balance training    PT Goals (Current goals can be found in the Care Plan section)  Acute Rehab PT Goals Patient Stated Goal: to go home PT Goal Formulation: With patient Time For Goal Achievement: 11/30/19 Potential to Achieve Goals: Good    Frequency BID   Barriers to discharge        Co-evaluation               AM-PAC PT "6 Clicks" Mobility  Outcome Measure Help needed turning from your back to your side while in a flat bed without using bedrails?: None Help needed moving from lying on your back to sitting on the side of a flat bed without using bedrails?: None Help needed moving to and from a bed to a chair (including a wheelchair)?: A Little Help needed standing up from a chair using your arms (e.g., wheelchair or bedside chair)?: A Little Help needed to walk in hospital room?: A Little Help needed climbing 3-5 steps with a railing? : A Little 6 Click Score: 20    End of Session Equipment Utilized During Treatment: Gait belt Activity Tolerance: Patient tolerated treatment well Patient left: in chair;with chair alarm set;with SCD's reapplied Nurse Communication: Mobility status PT Visit Diagnosis: Difficulty in walking, not elsewhere classified (R26.2);Pain Pain - Right/Left: Left Pain - part of body: Knee    Time: 9509-3267 PT Time Calculation (min) (ACUTE ONLY): 33 min   Charges:   PT Evaluation $PT Eval Moderate Complexity: 1 Mod PT Treatments $Therapeutic Exercise: 8-22 mins        Greggory Stallion, PT, DPT [phone]   Harvey Lingo 11/16/2019, 4:48 PM

## 2019-11-17 MED ORDER — ENOXAPARIN SODIUM 40 MG/0.4ML ~~LOC~~ SOLN: 40.0000 mg | SUBCUTANEOUS | 0 refills | Status: DC

## 2019-11-17 MED ORDER — OXYCODONE HCL 5 MG PO TABS: 5.0000 mg | ORAL_TABLET | ORAL | 0 refills | Status: DC | PRN

## 2019-11-17 MED ORDER — TRAMADOL HCL 50 MG PO TABS: 50.0000 mg | ORAL_TABLET | ORAL | 0 refills | Status: DC | PRN

## 2019-11-17 MED ORDER — CELECOXIB 200 MG PO CAPS: 200.0000 mg | ORAL_CAPSULE | Freq: Two times a day (BID) | ORAL | 0 refills | Status: DC

## 2019-11-17 NOTE — Progress Notes (Signed)
Ben PA spoke with patient and wife about dc instructions.  He will email complete instructions to patients email.  Dc instructions and medications gone over.  Verbalizes understanding.  Dc home via w/c

## 2019-11-17 NOTE — Progress Notes (Signed)
  Subjective: 1 Day Post-Op Procedure(s) (LRB): COMPUTER ASSISTED TOTAL KNEE ARTHROPLASTY (Left) Patient reports pain as well-controlled.   Patient is well, and has had no acute complaints or problems Plan is to go Home after hospital stay. Negative for chest pain and shortness of breath Fever: no Gastrointestinal: negative for nausea and vomiting.  Patient has had a bowel movement.  Objective: Vital signs in last 24 hours: Temp:  [97.5 F (36.4 C)-98.5 F (36.9 C)] 97.7 F (36.5 C) (02/13 1536) Pulse Rate:  [73-91] 78 (02/13 1536) Resp:  [16-18] 16 (02/13 0533) BP: (132-165)/(73-94) 150/91 (02/13 1536) SpO2:  [96 %-100 %] 100 % (02/13 1536)  Intake/Output from previous day:  Intake/Output Summary (Last 24 hours) at 11/17/2019 1545 Last data filed at 11/17/2019 1319 Gross per 24 hour  Intake 1104 ml  Output 2250 ml  Net -1146 ml    Intake/Output this shift: Total I/O In: 360 [P.O.:360] Out: 690 [Urine:650; Drains:40]  Labs: No results for input(s): HGB in the last 72 hours. No results for input(s): WBC, RBC, HCT, PLT in the last 72 hours. No results for input(s): NA, K, CL, CO2, BUN, CREATININE, GLUCOSE, CALCIUM in the last 72 hours. No results for input(s): LABPT, INR in the last 72 hours.   EXAM General - Patient is Alert, Appropriate and Oriented Extremity - Neurovascular intact Dorsiflexion/Plantar flexion intact Compartment soft Dressing/Incision -Postoperative dressing remains in place., Polar Care in place and working. , Hemovac in place.  Motor Function - intact, moving foot and toes well on exam.  Cardiovascular- Regular rate and rhythm, no murmurs/rubs/gallops Respiratory- Lungs clear to auscultation bilaterally Gastrointestinal- soft, nontender and active bowel sounds   Assessment/Plan: 1 Day Post-Op Procedure(s) (LRB): COMPUTER ASSISTED TOTAL KNEE ARTHROPLASTY (Left) Active Problems:   Total knee replacement status  Estimated body mass index is  32.19 kg/m as calculated from the following:   Height as of this encounter: 5\' 9"  (1.753 m).   Weight as of this encounter: 98.9 kg. Advance diet Up with therapy Discharge home with home health  Postoperative dressing and Hemovac removed.  Fresh honeycomb dressing placed.  DVT Prophylaxis - Lovenox, Ted hose and foot pumps Weight-Bearing as tolerated to left leg  Cassell Smiles, PA-C [facility] 11/17/2019, 3:45 PM

## 2019-11-17 NOTE — Anesthesia Postprocedure Evaluation (Signed)
Anesthesia Post Note  Patient: Mahlon Gabrielle  Procedure(s) Performed: COMPUTER ASSISTED TOTAL KNEE ARTHROPLASTY (Left Knee)  Patient location during evaluation: Nursing Unit Anesthesia Type: Spinal Level of consciousness: oriented and awake and alert Pain management: pain level controlled Vital Signs Assessment: post-procedure vital signs reviewed and stable Respiratory status: spontaneous breathing, respiratory function stable and patient connected to nasal cannula oxygen Cardiovascular status: blood pressure returned to baseline and stable Postop Assessment: no headache, no backache and no apparent nausea or vomiting Anesthetic complications: no     Last Vitals:  Vitals:   11/17/19 1144 11/17/19 1536  BP: (!) 145/73 (!) 150/91  Pulse: 79 78  Resp:    Temp: 36.5 C 36.5 C  SpO2: 97% 100%    Last Pain:  Vitals:   11/17/19 1536  TempSrc: Oral  PainSc:                  Martha Clan

## 2019-11-17 NOTE — Progress Notes (Signed)
Physical Therapy Treatment Patient Details Name: Rodney Vega MRN: [id number] DOB: 27-Aug-1956 Today's Date: 11/17/2019    History of Present Illness 64 y/o male s/p L TKR 2/12. History of previous R TKR in 2019.     PT Comments    Pt did very well with PT session POD1, w/o warm up was easily able to do 10 SLRs, showed surprising comfort with ROM tasks, getting to 106 AROM flexion with no increased pain.  Pt was able to safely and confidently circumambulate the nurses station with consistent cadence, minimal UE reliance and good speed and safety.  Overall pt is progressing nicely and is eager to go home when cleared for discharge.   Follow Up Recommendations  Home health PT     Equipment Recommendations  None recommended by PT    Recommendations for Other Services       Precautions / Restrictions Restrictions LLE Weight Bearing: Weight bearing as tolerated    Mobility  Bed Mobility Overal bed mobility: Independent Bed Mobility: Supine to Sit     Supine to sit: Independent     General bed mobility comments: Pt easily able to get up to sitting from supine  Transfers Overall transfer level: Modified independent Equipment used: Rolling walker (2 wheeled) Transfers: Sit to/from Stand Sit to Stand: Supervision         General transfer comment: minimal cuing to insure appropriate UE use, not issues with rising or return to sitting - good control  Ambulation/Gait Ambulation/Gait assistance: Supervision Gait Distance (Feet): 250 Feet Assistive device: Rolling walker (2 wheeled)       General Gait Details: Pt was able to easily and confidently circumambulate the nurses' station with minimal UE use on walker.  He had consistent cadence with no buckling or L knee hesitation, O2 remained in the 90s and overall pt could not believe how good he felt with the effort.     Stairs             Wheelchair Mobility    Modified Rankin (Stroke Patients Only)        Balance Overall balance assessment: Modified Independent Sitting-balance support: Feet supported Sitting balance-Leahy Scale: Good     Standing balance support: Bilateral upper extremity supported Standing balance-Leahy Scale: Good Standing balance comment: pt with minimal WBing through walker, maintains balance easily                            Cognition Arousal/Alertness: Awake/alert Behavior During Therapy: WFL for tasks assessed/performed Overall Cognitive Status: Within Functional Limits for tasks assessed                                        Exercises Total Joint Exercises Ankle Circles/Pumps: AROM;10 reps Quad Sets: Strengthening;10 reps Heel Slides: Strengthening;10 reps Hip ABduction/ADduction: Strengthening;10 reps Straight Leg Raises: Strengthening;10 reps Knee Flexion: AROM;5 reps Goniometric ROM: 0-106 AROM, minimal pain    General Comments        Pertinent Vitals/Pain Pain Assessment: 0-10 Pain Score: 1  Pain Location: pain hardly increases at all even with exercises and ROM    Home Living                      Prior Function            PT Goals (current goals can now be found  in the care plan section) Progress towards PT goals: Progressing toward goals    Frequency    BID      PT Plan Current plan remains appropriate    Co-evaluation              AM-PAC PT "6 Clicks" Mobility   Outcome Measure  Help needed turning from your back to your side while in a flat bed without using bedrails?: None Help needed moving from lying on your back to sitting on the side of a flat bed without using bedrails?: None Help needed moving to and from a bed to a chair (including a wheelchair)?: None Help needed standing up from a chair using your arms (e.g., wheelchair or bedside chair)?: None Help needed to walk in hospital room?: None Help needed climbing 3-5 steps with a railing? : None 6 Click Score: 24     End of Session Equipment Utilized During Treatment: Gait belt Activity Tolerance: Patient tolerated treatment well Patient left: with call bell/phone within reach;with chair alarm set Nurse Communication: Mobility status PT Visit Diagnosis: Difficulty in walking, not elsewhere classified (R26.2);Pain Pain - Right/Left: Left Pain - part of body: Knee     Time: 8159-4707 PT Time Calculation (min) (ACUTE ONLY): 41 min  Charges:  $Gait Training: 8-22 mins $Therapeutic Exercise: 8-22 mins $Therapeutic Activity: 8-22 mins                     Kreg Shropshire, DPT 11/17/2019, 12:47 PM

## 2019-11-17 NOTE — Discharge Instructions (Signed)
Orthopedic instructions: 1.  Take Lovenox injections once daily for 14 days. AFTER completion of Lovenox injections, begin taking two 81mg  aspirin daily. 2. Keep the incision clean and dry until your follow-up appointment in 2 weeks.  3. Work with home health therapy to increase your range of motion.  4. Continue wearing TED hose for the first 6 weeks following surgery. You may remove them at night.  5. Wear the PolarCare as tolerated. This works to decrease swelling and inflammation in the knee.

## 2019-11-17 NOTE — Progress Notes (Signed)
Physical Therapy Treatment Patient Details Name: Rodney Vega MRN: [id number] DOB: 07-14-1956 Today's Date: 11/17/2019    History of Present Illness 64 y/o male s/p L TKR 2/12. History of previous R TKR in 2019.     PT Comments    Pt continues to improve nicely on POD1, no assist required with mobility, gait, steps and only minimal cuing at times to insure safety awareness and respect that though he is feeling well he does need to insure he is deliberate and and safe as the knee is still acutely recovering.  Pt's wife present, answered questions about home safety and expectation, issues paper HEP as pt is unsure if he still has copy from previous TKA.  Pt doing well and is ready to go home from PT perspective.  Follow Up Recommendations  Home health PT     Equipment Recommendations  None recommended by PT    Recommendations for Other Services       Precautions / Restrictions Precautions Precautions: Fall;Knee Restrictions LLE Weight Bearing: Weight bearing as tolerated    Mobility  Bed Mobility Overal bed mobility: Independent Bed Mobility: Supine to Sit     Supine to sit: Independent     General bed mobility comments: Pt easily able to get up to sitting from supine  Transfers Overall transfer level: Modified independent Equipment used: Rolling walker (2 wheeled) Transfers: Sit to/from Stand Sit to Stand: Supervision         General transfer comment: again needs some cuing to insure safe/appropriate walker use/positioning  Ambulation/Gait Ambulation/Gait assistance: Supervision Gait Distance (Feet): 300 Feet Assistive device: Rolling walker (2 wheeled)       General Gait Details: Pt again with minimal reliance on the walker, good confidence, safety and consistent cadence.  No buckling/hesitation.   Stairs Stairs: Yes Stairs assistance: Min guard Stair Management: Two rails Number of Stairs: 4 General stair comments: Pt able to easily negotiate up/down  steps safely and w/o issue   Wheelchair Mobility    Modified Rankin (Stroke Patients Only)       Balance Overall balance assessment: Modified Independent Sitting-balance support: Feet supported Sitting balance-Leahy Scale: Good     Standing balance support: Bilateral upper extremity supported Standing balance-Leahy Scale: Good                              Cognition Arousal/Alertness: Awake/alert Behavior During Therapy: WFL for tasks assessed/performed Overall Cognitive Status: Within Functional Limits for tasks assessed                                        Exercises Total Joint Exercises Quad Sets: Strengthening;10 reps Short Arc Quad: Strengthening;15 reps(pt with some fatigue by the end of 15 reps) Heel Slides: Strengthening;10 reps Hip ABduction/ADduction: Strengthening;10 reps Straight Leg Raises: Strengthening;10 reps Knee Flexion: AROM;5 reps Goniometric ROM: ~110 AROM    General Comments        Pertinent Vitals/Pain Pain Assessment: 0-10 Pain Score: 1     Home Living                      Prior Function            PT Goals (current goals can now be found in the care plan section) Progress towards PT goals: Progressing toward goals    Frequency  BID      PT Plan Current plan remains appropriate    Co-evaluation              AM-PAC PT "6 Clicks" Mobility   Outcome Measure  Help needed turning from your back to your side while in a flat bed without using bedrails?: None Help needed moving from lying on your back to sitting on the side of a flat bed without using bedrails?: None Help needed moving to and from a bed to a chair (including a wheelchair)?: None Help needed standing up from a chair using your arms (e.g., wheelchair or bedside chair)?: None Help needed to walk in hospital room?: None Help needed climbing 3-5 steps with a railing? : None 6 Click Score: 24    End of Session  Equipment Utilized During Treatment: Gait belt Activity Tolerance: Patient tolerated treatment well Patient left: with call bell/phone within reach;with chair alarm set Nurse Communication: Mobility status PT Visit Diagnosis: Difficulty in walking, not elsewhere classified (R26.2);Pain Pain - Right/Left: Left Pain - part of body: Knee     Time: 4742-5956 PT Time Calculation (min) (ACUTE ONLY): 28 min  Charges:  $Gait Training: 8-22 mins $Therapeutic Exercise: 8-22 mins                     Kreg Shropshire, DPT 11/17/2019, 5:11 PM

## 2019-11-17 NOTE — Discharge Summary (Signed)
Physician Discharge Summary  Patient ID: Rodney Vega MRN: [id number] DOB/AGE: September 28, 1956 64 y.o.  Admit date: 11/16/2019 Discharge date: 11/17/2019  Admission Diagnoses:  Total knee replacement status [Z96.659]  Surgeries:  Left total knee arthroplasty using computer-assisted navigation  SURGEON:  Marciano Sequin. M.D.  ASSISTANT: Cassell Smiles, PA-C (present and scrubbed throughout the case, critical for assistance with exposure, retraction, instrumentation, and closure)  ANESTHESIA: spinal  ESTIMATED BLOOD LOSS: 50 mL  FLUIDS REPLACED: 1200 mL of crystalloid  TOURNIQUET TIME: 88 minutes  DRAINS: 2 medium Hemovac drains  SOFT TISSUE RELEASES: Anterior cruciate ligament, posterior cruciate ligament, deep medial collateral ligament, patellofemoral ligament  IMPLANTS UTILIZED: DePuy Attune size 7 posterior stabilized femoral component (cemented), size 7 rotating platform tibial component (cemented), 38 mm medialized dome patella (cemented), and a 5 mm stabilized rotating platform polyethylene insert.  Discharge Diagnoses: Patient Active Problem List   Diagnosis Date Noted  . 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 Colostomy in place [facility]) 05/08/2018  . Nephrolithiasis 05/08/2018  . Status post total right knee replacement 05/08/2018  . Obesity (BMI 30.0-34.9) 10/11/2017  . Primary osteoarthritis of both knees 10/11/2017  . Colostomy status ([place]) 08/27/2013    Past Medical History:  Diagnosis Date  . Degenerative arthritis of knee   . Depression   . Diverticulitis   . Glaucoma    since 2004  . Hernia 1979  . History of kidney stones   . Hypertension   . Personal history of tobacco use, presenting hazards to health   . Pre-diabetes   . Screening for obesity   . Special screening for malignant neoplasms, colon      Transfusion:    Consultants (if any):   Discharged Condition: Improved  Hospital Course: Rodney Vega is an 64 y.o. male who was  admitted 11/16/2019 with a diagnosis of left knee osteoarthritis and went to the operating room on 11/16/2019 and underwent left total knee arthroplasty. The patient received perioperative antibiotics for prophylaxis (see below). The patient tolerated the procedure well and was transported to PACU in stable condition. After meeting PACU criteria, the patient was subsequently transferred to the Orthopaedics/Rehabilitation unit.   The patient received DVT prophylaxis in the form of early mobilization, Lovenox, Foot Pumps and TED hose. A sacral pad had been placed and heels were elevated off of the bed with rolled towels in order to protect skin integrity. Foley catheter was discontinued on postoperative day #0. Wound drains were discontinued on postoperative day #1. The surgical incision was healing well without signs of infection.  Physical therapy was initiated postoperatively for transfers, gait training, and strengthening. Occupational therapy was initiated for activities of daily living and evaluation for assisted devices. Rehabilitation goals were reviewed in detail with the patient. The patient made steady progress with physical therapy and physical therapy recommended discharge to Home.   The patient achieved his preliminary goals of this hospitalization and was felt to be medically and orthopaedically appropriate for discharge.  He was given perioperative antibiotics:  Anti-infectives (From admission, onward)   Start     Dose/Rate Route Frequency Ordered Stop   11/16/19 1429  ceFAZolin (ANCEF) IVPB 2g/100 mL premix     2 g 200 mL/hr over 30 Minutes Intravenous Every 6 hours 11/16/19 1429 11/17/19 0938   11/16/19 0622  ceFAZolin (ANCEF) 2-4 GM/100ML-% IVPB    Note to Pharmacy: Myles Lipps   : cabinet override      11/16/19 0622 11/16/19 1497   11/16/19 0263  ceFAZolin (ANCEF) IVPB 2g/100 mL premix     2 g 200 mL/hr over 30 Minutes Intravenous On call to O.R. 11/16/19 9390 11/16/19 0750      .  Recent vital signs:  Vitals:   11/17/19 1144 11/17/19 1536  BP: (!) 145/73 (!) 150/91  Pulse: 79 78  Resp:    Temp: 97.7 F (36.5 C) 97.7 F (36.5 C)  SpO2: 97% 100%    Recent laboratory studies:  No results for input(s): WBC, HGB, HCT, PLT, K, CL, CO2, BUN, CREATININE, GLUCOSE, CALCIUM, LABPT, INR in the last 72 hours.  Diagnostic Studies: DG Knee Left Port  Result Date: 11/16/2019 CLINICAL DATA:  Knee replacement EXAM: PORTABLE LEFT KNEE - 1-2 VIEW COMPARISON:  None similar FINDINGS: Total knee arthroplasty that is well seated. There is no subluxation or fracture. IMPRESSION: Total knee arthroplasty without complicating feature. Electronically Signed   By: Monte Fantasia M.D.   On: 11/16/2019 11:37    Discharge Medications:   Allergies as of 11/17/2019      Reactions   Aleve [naproxen Sodium] Itching      Medication List    TAKE these medications   acetaminophen 500 MG tablet Commonly known as: TYLENOL Take 1,000 mg by mouth daily.   celecoxib 200 MG capsule Commonly known as: CELEBREX Take 1 capsule (200 mg total) by mouth 2 (two) times daily.   diphenhydramine-acetaminophen 25-500 MG Tabs tablet Commonly known as: TYLENOL PM Take 1-2 tablets by mouth at bedtime as needed (sleep.).   dorzolamide-timolol 22.3-6.8 MG/ML ophthalmic solution Commonly known as: COSOPT Place 1 drop into both eyes 2 (two) times daily.   enoxaparin 40 MG/0.4ML injection Commonly known as: LOVENOX Inject 0.4 mLs (40 mg total) into the skin daily for 14 days.   latanoprost 0.005 % ophthalmic solution Commonly known as: XALATAN Place 1 drop into both eyes at bedtime.   losartan 100 MG tablet Commonly known as: COZAAR Take 100 mg by mouth daily.   oxyCODONE 5 MG immediate release tablet Commonly known as: Oxy IR/ROXICODONE Take 1 tablet (5 mg total) by mouth every 4 (four) hours as needed for moderate pain (pain score 4-6).   traMADol 50 MG tablet Commonly known as:  ULTRAM Take 1 tablet (50 mg total) by mouth every 4 (four) hours as needed for moderate pain.   venlafaxine XR 75 MG 24 hr capsule Commonly known as: EFFEXOR-XR Take 225 mg by mouth daily.            Durable Medical Equipment  (From admission, onward)         Start     Ordered   11/16/19 1430  DME Walker rolling  Once    Question:  Patient needs a walker to treat with the following condition  Answer:  Total knee replacement status   11/16/19 1429   11/16/19 1430  DME Bedside commode  Once    Question:  Patient needs a bedside commode to treat with the following condition  Answer:  Total knee replacement status   11/16/19 1429          Disposition: Home with home health PT     Follow-up Information    [facility] On 11/30/2019.   Specialty: Orthopedic Surgery Why: at 9:15am Contact information: [place] [address] [phone]        Dereck Leep, MD On 01/01/2020.   Specialty: Orthopedic Surgery Why: at 9:15am Contact information:  [place] [address] [place], Hershal Coria 11/17/2019, 3:49 PM

## 2020-01-06 DIAGNOSIS — Z96652 Presence of left artificial knee joint: Secondary | ICD-10-CM | POA: Insufficient documentation

## 2021-01-13 ENCOUNTER — Other Ambulatory Visit: Payer: Self-pay

## 2021-01-13 ENCOUNTER — Encounter: Payer: Self-pay | Admitting: Ophthalmology

## 2021-01-19 NOTE — Discharge Instructions (Signed)

## 2021-01-20 ENCOUNTER — Other Ambulatory Visit: Payer: Self-pay

## 2021-01-20 ENCOUNTER — Encounter: Payer: Self-pay | Admitting: Ophthalmology

## 2021-01-20 ENCOUNTER — Ambulatory Visit
Admission: RE | Admit: 2021-01-20 | Discharge: 2021-01-20 | Disposition: A | Discharge status: Home / Self Care | Payer: Medicare PPO | Attending: Ophthalmology | Admitting: Ophthalmology

## 2021-01-20 ENCOUNTER — Ambulatory Visit: Payer: Medicare PPO | Admitting: Anesthesiology

## 2021-01-20 ENCOUNTER — Encounter
Admission: RE | Disposition: A | Discharge status: Home / Self Care | Payer: Self-pay | Source: Home / Self Care | Attending: Ophthalmology

## 2021-01-20 DIAGNOSIS — F17210 Nicotine dependence, cigarettes, uncomplicated: Secondary | ICD-10-CM | POA: Insufficient documentation

## 2021-01-20 DIAGNOSIS — Z79899 Other long term (current) drug therapy: Secondary | ICD-10-CM | POA: Diagnosis not present

## 2021-01-20 DIAGNOSIS — H2512 Age-related nuclear cataract, left eye: Secondary | ICD-10-CM | POA: Diagnosis not present

## 2021-01-20 DIAGNOSIS — Z886 Allergy status to analgesic agent status: Secondary | ICD-10-CM | POA: Insufficient documentation

## 2021-01-20 DIAGNOSIS — Z96653 Presence of artificial knee joint, bilateral: Secondary | ICD-10-CM | POA: Insufficient documentation

## 2021-01-20 HISTORY — PX: CATARACT EXTRACTION W/PHACO: SHX586

## 2021-01-20 HISTORY — DX: Presence of dental prosthetic device (complete) (partial): Z97.2

## 2021-01-20 SURGERY — PHACOEMULSIFICATION, CATARACT, WITH IOL INSERTION
Anesthesia: Monitor Anesthesia Care | Site: Eye | Laterality: Left

## 2021-01-20 MED ORDER — EPINEPHRINE PF 1 MG/ML IJ SOLN
INTRAOCULAR | Status: DC | PRN
  Administered 2021-01-20: 46 mL via OPHTHALMIC

## 2021-01-20 MED ORDER — TETRACAINE HCL 0.5 % OP SOLN
1.0000 [drp] | OPHTHALMIC | Status: DC | PRN
  Administered 2021-01-20 (×3): 1 [drp] via OPHTHALMIC

## 2021-01-20 MED ORDER — ONDANSETRON HCL 4 MG/2ML IJ SOLN: 4.0000 mg | Freq: Once | INTRAMUSCULAR | Status: DC | PRN

## 2021-01-20 MED ORDER — NA CHONDROIT SULF-NA HYALURON 40-17 MG/ML IO SOLN
INTRAOCULAR | Status: DC | PRN
  Administered 2021-01-20: 1 mL via INTRAOCULAR

## 2021-01-20 MED ORDER — BRIMONIDINE TARTRATE-TIMOLOL 0.2-0.5 % OP SOLN
OPHTHALMIC | Status: DC | PRN
  Administered 2021-01-20: 1 [drp] via OPHTHALMIC

## 2021-01-20 MED ORDER — ACETAMINOPHEN 325 MG PO TABS: 325.0000 mg | ORAL_TABLET | ORAL | Status: DC | PRN

## 2021-01-20 MED ORDER — FENTANYL CITRATE (PF) 100 MCG/2ML IJ SOLN
INTRAMUSCULAR | Status: DC | PRN
  Administered 2021-01-20 (×2): 50 ug via INTRAVENOUS

## 2021-01-20 MED ORDER — ACETAMINOPHEN 160 MG/5ML PO SOLN: 325.0000 mg | ORAL | Status: DC | PRN

## 2021-01-20 MED ORDER — LIDOCAINE HCL (PF) 2 % IJ SOLN
INTRAOCULAR | Status: DC | PRN
  Administered 2021-01-20: 1 mL

## 2021-01-20 MED ORDER — ARMC OPHTHALMIC DILATING DROPS
1.0000 "application " | OPHTHALMIC | Status: DC | PRN
  Administered 2021-01-20 (×3): 1 via OPHTHALMIC

## 2021-01-20 MED ORDER — MIDAZOLAM HCL 2 MG/2ML IJ SOLN
INTRAMUSCULAR | Status: DC | PRN
  Administered 2021-01-20: 2 mg via INTRAVENOUS

## 2021-01-20 MED ORDER — MOXIFLOXACIN HCL 0.5 % OP SOLN
OPHTHALMIC | Status: DC | PRN
  Administered 2021-01-20: 0.2 mL via OPHTHALMIC

## 2021-01-20 SURGICAL SUPPLY — 17 items
CANNULA ANT/CHMB 27GA (MISCELLANEOUS) ×4 IMPLANT
GLOVE SURG TRIUMPH 8.0 PF LTX (GLOVE) ×6 IMPLANT
GOWN STRL REUS W/ TWL LRG LVL3 (GOWN DISPOSABLE) ×2 IMPLANT
GOWN STRL REUS W/TWL LRG LVL3 (GOWN DISPOSABLE) ×4
LENS IOL TECNIS EYHANCE 20.0 (Intraocular Lens) ×2 IMPLANT
MARKER SKIN DUAL TIP RULER LAB (MISCELLANEOUS) ×2 IMPLANT
NEEDLE FILTER BLUNT 18X 1/2SAF (NEEDLE) ×1
NEEDLE FILTER BLUNT 18X1 1/2 (NEEDLE) ×1 IMPLANT
PACK EYE AFTER SURG (MISCELLANEOUS) ×2 IMPLANT
PACK OPTHALMIC (MISCELLANEOUS) ×2 IMPLANT
PACK PORFILIO (MISCELLANEOUS) ×2 IMPLANT
SUT ETHILON 10-0 CS-B-6CS-B-6 (SUTURE)
SUTURE EHLN 10-0 CS-B-6CS-B-6 (SUTURE) IMPLANT
SYR 3ML LL SCALE MARK (SYRINGE) ×2 IMPLANT
SYR TB 1ML LUER SLIP (SYRINGE) ×2 IMPLANT
WATER STERILE IRR 250ML POUR (IV SOLUTION) ×2 IMPLANT
WIPE NON LINTING 3.25X3.25 (MISCELLANEOUS) ×2 IMPLANT

## 2021-01-20 NOTE — H&P (Signed)
[facility]   Primary Care Physician:  [name], MD Ophthalmologist: Dr. George Ina  Pre-Procedure History & Physical: HPI:  Rodney Vega is a 65 y.o. male here for cataract surgery.   Past Medical History:  Diagnosis Date  . Degenerative arthritis of knee   . Depression   . Diverticulitis   . Glaucoma    since 2004  . Hernia 1979  . History of kidney stones   . Hypertension   . Personal history of tobacco use, presenting hazards to health   . Pre-diabetes   . Screening for obesity   . Special screening for malignant neoplasms, colon   . Wears dentures    full upper and lower    Past Surgical History:  Procedure Laterality Date  . [place], 2012   lumbar bulging disc  . COLONOSCOPY  8676,1950   [facility]/Dr. Buelah Manis sessile polyp,65mm, found in rectum,multiple diverticula were found in the sigmoid, in descending and in transverse colon  . COLOSTOMY  04-20-13  . HERNIA REPAIR  1979   inguinal  . JOINT REPLACEMENT Right    knee  . KNEE ARTHROPLASTY Right 05/08/2018   Procedure: COMPUTER ASSISTED TOTAL KNEE ARTHROPLASTY;  Surgeon: Dereck Leep, MD;  Location: [facility] ORS;  Service: Orthopedics;  Laterality: Right;  . KNEE ARTHROPLASTY Left 11/16/2019   Procedure: COMPUTER ASSISTED TOTAL KNEE ARTHROPLASTY;  Surgeon: Dereck Leep, MD;  Location: [facility] ORS;  Service: Orthopedics;  Laterality: Left;  . LAPAROTOMY  04-20-13   colon resection with colosotmy  . LITHOTRIPSY  2013  . TONSILLECTOMY AND ADENOIDECTOMY  1962    Prior to Admission medications   Medication Sig Start Date End Date Taking? Authorizing Provider  acetaminophen (TYLENOL) 500 MG tablet Take 1,000 mg by mouth daily.   Yes [provider]  cetirizine (ZYRTEC) 10 MG tablet Take 10 mg by mouth daily.   Yes [provider]  Cyanocobalamin (VITAMIN B-12 PO) Take by mouth daily.   Yes [provider]  diphenhydramine-acetaminophen (TYLENOL PM) 25-500 MG TABS Take 1-2  tablets by mouth at bedtime as needed (sleep.).    Yes [provider]  dorzolamide-timolol (COSOPT) 22.3-6.8 MG/ML ophthalmic solution Place 1 drop into both eyes 2 (two) times daily.   Yes [provider]  guaiFENesin (MUCINEX) 600 MG 12 hr tablet Take by mouth 2 (two) times daily as needed.   Yes [provider]  latanoprost (XALATAN) 0.005 % ophthalmic solution Place 1 drop into both eyes at bedtime.   Yes [provider]  olmesartan (BENICAR) 40 MG tablet Take 40 mg by mouth daily.   Yes [provider]  venlafaxine XR (EFFEXOR-XR) 75 MG 24 hr capsule Take 225 mg by mouth daily. 09/24/19  Yes [provider]    Allergies as of 12/24/2020 - Review Complete 11/16/2019  Allergen Reaction Noted  . Aleve [naproxen sodium] Itching 05/01/2013    Family History  Problem Relation Age of Onset  . Diabetes Mother   . Heart disease Mother   . Heart attack Father   . Prostate cancer Father     Social History   Socioeconomic History  . Marital status: Single    Spouse name: Not on file  . Number of children: Not on file  . Years of education: Not on file  . Highest education level: Not on file  Occupational History  . Not on file  Tobacco Use  . Smoking status: Current Every Day Smoker    Packs/day:  1.00    Years: 20.00    Pack years: 20.00    Types: Cigarettes  . Smokeless tobacco: Never Used  . Tobacco comment: since age 33 or 65  Vaping Use  . Vaping Use: Never used  Substance and Sexual Activity  . Alcohol use: Yes    Comment: occassional  . Drug use: Yes    Types: Marijuana  . Sexual activity: Not on file  Other Topics Concern  . Not on file  Social History Narrative  . Not on file   Social Determinants of Health   Financial Resource Strain: Not on file  Food Insecurity: Not on file  Transportation Needs: Not on file  Physical Activity: Not on file  Stress: Not on file  Social Connections: Not on file   Intimate Partner Violence: Not on file    Review of Systems: See HPI, otherwise negative ROS  Physical Exam: BP (!) 156/91   Pulse 76   Temp (!) 96.8 F (36 C) (Temporal)   Resp 16   Ht 5\' 11"  (1.803 m)   Wt 94.1 kg   SpO2 97%   BMI 28.94 kg/m  General:   Alert,  pleasant and cooperative in NAD Head:  Normocephalic and atraumatic. Respiratory:  Normal work of breathing. Cardiovascular:  RRR  Impression/Plan: Rodney Vega is here for cataract surgery.  Risks, benefits, limitations, and alternatives regarding cataract surgery have been reviewed with the patient.  Questions have been answered.  All parties agreeable.   Birder Robson, MD  01/20/2021, 8:11 AM

## 2021-01-20 NOTE — Anesthesia Procedure Notes (Signed)
Procedure Name: [place] Performed by: Silvana Newness, CRNA Pre-anesthesia Checklist: Patient identified, Emergency Drugs available, Suction available, Patient being monitored and Timeout performed Patient Re-evaluated:Patient Re-evaluated prior to induction

## 2021-01-20 NOTE — Anesthesia Postprocedure Evaluation (Signed)
Anesthesia Post Note  Patient: Rodney Vega  Procedure(s) Performed: CATARACT EXTRACTION PHACO AND INTRAOCULAR LENS PLACEMENT (IOC) LEFT (Left Eye)     Patient location during evaluation: PACU Anesthesia Type: MAC Level of consciousness: awake Pain management: pain level controlled Vital Signs Assessment: post-procedure vital signs reviewed and stable Respiratory status: respiratory function stable Cardiovascular status: stable Postop Assessment: no apparent nausea or vomiting Anesthetic complications: no   No complications documented.  [name]

## 2021-01-20 NOTE — Transfer of Care (Signed)
Immediate Anesthesia Transfer of Care Note  Patient: Rodney Vega  Procedure(s) Performed: CATARACT EXTRACTION PHACO AND INTRAOCULAR LENS PLACEMENT (IOC) LEFT (Left Eye)  Patient Location: PACU  Anesthesia Type: MAC  Level of Consciousness: awake, alert  and patient cooperative  Airway and Oxygen Therapy: Patient Spontanous Breathing and Patient connected to supplemental oxygen  Post-op Assessment: Post-op Vital signs reviewed, Patient's Cardiovascular Status Stable, Respiratory Function Stable, Patent Airway and No signs of Nausea or vomiting  Post-op Vital Signs: Reviewed and stable  Complications: No complications documented.

## 2021-01-20 NOTE — Anesthesia Preprocedure Evaluation (Signed)
Anesthesia Evaluation  Patient identified by MRN, date of birth, ID band Patient awake    Reviewed: Allergy & Precautions, NPO status   Airway Mallampati: II  TM Distance: >3 FB     Dental   Pulmonary Current Smoker and Patient abstained from smoking.,    Pulmonary exam normal        Cardiovascular hypertension,  Rhythm:Regular Rate:Normal     Neuro/Psych PSYCHIATRIC DISORDERS Depression    GI/Hepatic   Endo/Other    Renal/GU      Musculoskeletal  (+) Arthritis ,   Abdominal   Peds  Hematology   Anesthesia Other Findings   Reproductive/Obstetrics                             Anesthesia Physical Anesthesia Plan  ASA: III  Anesthesia Plan: MAC   Post-op Pain Management:    Induction: Intravenous  PONV Risk Score and Plan: TIVA, Midazolam and Treatment may vary due to age or medical condition  Airway Management Planned: Natural Airway and Nasal Cannula  Additional Equipment:   Intra-op Plan:   Post-operative Plan:   Informed Consent: I have reviewed the patients History and Physical, chart, labs and discussed the procedure including the risks, benefits and alternatives for the proposed anesthesia with the patient or authorized representative who has indicated his/her understanding and acceptance.       Plan Discussed with: CRNA  Anesthesia Plan Comments:         Anesthesia Quick Evaluation

## 2021-01-20 NOTE — Op Note (Signed)
PREOPERATIVE DIAGNOSIS:  Nuclear sclerotic cataract of the left eye.   POSTOPERATIVE DIAGNOSIS:  Nuclear sclerotic cataract of the left eye.   OPERATIVE PROCEDURE:@   SURGEON:  Birder Robson, MD.   ANESTHESIA:  Anesthesiologist: [name], MD CRNA: Izetta Dakin, CRNA; Silvana Newness, CRNA  1.      Managed anesthesia care. 2.     0.74ml of Shugarcaine was instilled following the paracentesis   COMPLICATIONS:  None.   TECHNIQUE:   Stop and chop   DESCRIPTION OF PROCEDURE:  The patient was examined and consented in the preoperative holding area where the aforementioned topical anesthesia was applied to the left eye and then brought back to the Operating Room where the left eye was prepped and draped in the usual sterile ophthalmic fashion and a lid speculum was placed. A paracentesis was created with the side port blade and the anterior chamber was filled with viscoelastic. A near clear corneal incision was performed with the steel keratome. A continuous curvilinear capsulorrhexis was performed with a cystotome followed by the capsulorrhexis forceps. Hydrodissection and hydrodelineation were carried out with BSS on a blunt cannula. The lens was removed in a stop and chop  technique and the remaining cortical material was removed with the irrigation-aspiration handpiece. The capsular bag was inflated with viscoelastic and the Technis [id number] lens was placed in the capsular bag without complication. The remaining viscoelastic was removed from the eye with the irrigation-aspiration handpiece. The wounds were hydrated. The anterior chamber was flushed with BSS and the eye was inflated to physiologic pressure. 0.48ml Vigamox was placed in the anterior chamber. The wounds were found to be water tight. The eye was dressed with Combigan. The patient was given protective glasses to wear throughout the day and a shield with which to sleep tonight. The patient was also given drops with which to begin a  drop regimen today and will follow-up with me in one day. Implant Name Type Inv. Item Serial No. Manufacturer Lot No. LRB No. Used Action  LENS IOL TECNIS EYHANCE 20.0 - [id number] Intraocular Lens LENS IOL TECNIS EYHANCE 20.0 [id number] JOHNSON   Left 1 Implanted    Procedure(s) with comments: CATARACT EXTRACTION PHACO AND INTRAOCULAR LENS PLACEMENT ([facility]) LEFT (Left) - 10.13 0:52.1  Electronically signed: Birder Robson 01/20/2021 8:34 AM

## 2021-01-21 ENCOUNTER — Encounter: Payer: Self-pay | Admitting: Ophthalmology

## 2021-05-06 ENCOUNTER — Other Ambulatory Visit: Payer: Self-pay | Admitting: Student

## 2021-05-06 DIAGNOSIS — R918 Other nonspecific abnormal finding of lung field: Secondary | ICD-10-CM

## 2021-05-12 ENCOUNTER — Ambulatory Visit: Payer: Medicare PPO

## 2021-05-15 ENCOUNTER — Ambulatory Visit: Payer: Medicare PPO

## 2021-05-19 ENCOUNTER — Other Ambulatory Visit: Payer: Self-pay

## 2021-05-19 ENCOUNTER — Ambulatory Visit
Admission: RE | Admit: 2021-05-19 | Discharge: 2021-05-19 | Disposition: A | Discharge status: Home / Self Care | Payer: Medicare PPO | Source: Ambulatory Visit | Attending: Student | Admitting: Student

## 2021-05-19 DIAGNOSIS — R918 Other nonspecific abnormal finding of lung field: Secondary | ICD-10-CM | POA: Diagnosis present

## 2021-05-19 LAB — POCT I-STAT CREATININE: Creatinine, Ser: 1.1 mg/dL (ref 0.61–1.24)

## 2021-05-19 MED ORDER — IOHEXOL 350 MG/ML SOLN
50.0000 mL | Freq: Once | INTRAVENOUS | Status: AC | PRN
  Administered 2021-05-19: 50 mL via INTRAVENOUS

## 2021-05-20 ENCOUNTER — Inpatient Hospital Stay: Payer: Medicare PPO

## 2021-05-20 ENCOUNTER — Encounter: Payer: Self-pay | Admitting: *Deleted

## 2021-05-20 ENCOUNTER — Encounter: Payer: Self-pay | Admitting: Internal Medicine

## 2021-05-20 ENCOUNTER — Inpatient Hospital Stay: Payer: Medicare PPO | Attending: Internal Medicine | Admitting: Internal Medicine

## 2021-05-20 ENCOUNTER — Telehealth: Payer: Self-pay | Admitting: Internal Medicine

## 2021-05-20 ENCOUNTER — Ambulatory Visit: Payer: Medicare PPO

## 2021-05-20 VITALS — BP 156/92 | HR 98 | Temp 98.0°F | Resp 18 | Wt 203.5 lb

## 2021-05-20 DIAGNOSIS — F17210 Nicotine dependence, cigarettes, uncomplicated: Secondary | ICD-10-CM | POA: Diagnosis not present

## 2021-05-20 DIAGNOSIS — Z8042 Family history of malignant neoplasm of prostate: Secondary | ICD-10-CM | POA: Insufficient documentation

## 2021-05-20 DIAGNOSIS — I700 Atherosclerosis of aorta: Secondary | ICD-10-CM | POA: Diagnosis not present

## 2021-05-20 DIAGNOSIS — Z79899 Other long term (current) drug therapy: Secondary | ICD-10-CM | POA: Insufficient documentation

## 2021-05-20 DIAGNOSIS — C3411 Malignant neoplasm of upper lobe, right bronchus or lung: Secondary | ICD-10-CM | POA: Insufficient documentation

## 2021-05-20 DIAGNOSIS — R918 Other nonspecific abnormal finding of lung field: Secondary | ICD-10-CM

## 2021-05-20 DIAGNOSIS — Z933 Colostomy status: Secondary | ICD-10-CM | POA: Insufficient documentation

## 2021-05-20 DIAGNOSIS — R0789 Other chest pain: Secondary | ICD-10-CM | POA: Diagnosis not present

## 2021-05-20 DIAGNOSIS — N200 Calculus of kidney: Secondary | ICD-10-CM | POA: Diagnosis not present

## 2021-05-20 DIAGNOSIS — E279 Disorder of adrenal gland, unspecified: Secondary | ICD-10-CM | POA: Diagnosis not present

## 2021-05-20 DIAGNOSIS — R590 Localized enlarged lymph nodes: Secondary | ICD-10-CM | POA: Diagnosis not present

## 2021-05-20 DIAGNOSIS — J439 Emphysema, unspecified: Secondary | ICD-10-CM | POA: Diagnosis not present

## 2021-05-20 DIAGNOSIS — Z87442 Personal history of urinary calculi: Secondary | ICD-10-CM | POA: Diagnosis not present

## 2021-05-20 DIAGNOSIS — I10 Essential (primary) hypertension: Secondary | ICD-10-CM | POA: Insufficient documentation

## 2021-05-20 LAB — CBC WITH DIFFERENTIAL/PLATELET
Abs Immature Granulocytes: 0.03 10*3/uL (ref 0.00–0.07)
Basophils Absolute: 0.1 10*3/uL (ref 0.0–0.1)
Basophils Relative: 1 %
Eosinophils Absolute: 0.2 10*3/uL (ref 0.0–0.5)
Eosinophils Relative: 2 %
HCT: 39.8 % (ref 39.0–52.0)
Hemoglobin: 13.8 g/dL (ref 13.0–17.0)
Immature Granulocytes: 0 %
Lymphocytes Relative: 19 %
Lymphs Abs: 1.4 10*3/uL (ref 0.7–4.0)
MCH: 31.1 pg (ref 26.0–34.0)
MCHC: 34.7 g/dL (ref 30.0–36.0)
MCV: 89.6 fL (ref 80.0–100.0)
Monocytes Absolute: 0.4 10*3/uL (ref 0.1–1.0)
Monocytes Relative: 6 %
Neutro Abs: 5.6 10*3/uL (ref 1.7–7.7)
Neutrophils Relative %: 72 %
Platelets: 235 10*3/uL (ref 150–400)
RBC: 4.44 MIL/uL (ref 4.22–5.81)
RDW: 12.6 % (ref 11.5–15.5)
WBC: 7.7 10*3/uL (ref 4.0–10.5)
nRBC: 0 % (ref 0.0–0.2)

## 2021-05-20 LAB — COMPREHENSIVE METABOLIC PANEL
ALT: 10 U/L (ref 0–44)
AST: 18 U/L (ref 15–41)
Albumin: 4.1 g/dL (ref 3.5–5.0)
Alkaline Phosphatase: 112 U/L (ref 38–126)
Anion gap: 7 (ref 5–15)
BUN: 7 mg/dL — ABNORMAL LOW (ref 8–23)
CO2: 30 mmol/L (ref 22–32)
Calcium: 9 mg/dL (ref 8.9–10.3)
Chloride: 100 mmol/L (ref 98–111)
Creatinine, Ser: 1.11 mg/dL (ref 0.61–1.24)
GFR, Estimated: >60 mL/min (ref >60)
Glucose, Bld: 228 mg/dL — ABNORMAL HIGH (ref 70–99)
Potassium: 3.4 mmol/L — ABNORMAL LOW (ref 3.5–5.1)
Sodium: 137 mmol/L (ref 135–145)
Total Bilirubin: 0.4 mg/dL (ref 0.3–1.2)
Total Protein: 7.5 g/dL (ref 6.5–8.1)

## 2021-05-20 LAB — PROTIME-INR
INR: 1 (ref 0.8–1.2)
Prothrombin Time: 13.1 seconds (ref 11.4–15.2)

## 2021-05-20 LAB — APTT: aPTT: 29 seconds (ref 24–36)

## 2021-05-20 LAB — LACTATE DEHYDROGENASE: LDH: 172 U/L (ref 98–192)

## 2021-05-20 NOTE — Progress Notes (Signed)
Met with patient during initial consult with Dr. Brahmanday to discuss recent CT scan and next steps. All questions answered during visit. Pt informed that he will be called with his appt for PET scan and biopsy once scheduled. Contact info given and instructed to call with any questions or needs. Pt verbalized understanding. 

## 2021-05-20 NOTE — Telephone Encounter (Signed)
On 8/17-called and left a voicemail for the patient to get his daughter's number for me to call/update.  Hayley-please reach out to patient; and forward me his daughter's number  Thanks  GB

## 2021-05-20 NOTE — Progress Notes (Signed)
[place] NOTE  Patient Care Team: [name], [name], [name], RN as Oncology Nurse Navigator  CHIEF COMPLAINTS/PURPOSE OF CONSULTATION: lung nodule/mass  #  IMPRESSION: 8.2 x 5.9 cm spiculated mass noted in right upper lobe consistent with malignancy. It extends from the right hilum to the lateral chest wall and results in lytic destruction of the lateral portion of the right fourth rib. These results will be called to the ordering clinician or representative by the Radiologist Assistant, and communication documented in the PACS or zVision Dashboard.   Mediastinal lymph nodes are noted, including 12 mm right hilar lymph node, consistent with metastatic disease.   3 cm right adrenal mass is noted concerning for metastatic disease.    Oncology History   No history exists.     HISTORY OF PRESENTING ILLNESS:  Rodney Vega 65 y.o.  male history of smoking is here for further evaluation and recommendations for lung mass.   Patient noted to have right chest wall pain/cough approximately 4 months ago.  Patient was treated with antibiotics symptoms improved but not resolved.  Patient continued to have a lingering chest wall pain-which led to further imaging with chest x-ray and finally CT scan as above.  With regards to chest wall pain patient has been taking 1-2 Tylenol today; only hurts with movement.   Patient denies any headaches or nausea vomiting.   Review of Systems  Constitutional:  Negative for chills, diaphoresis, fever, malaise/fatigue and weight loss.  HENT:  Negative for nosebleeds and sore throat.   Eyes:  Negative for double vision.  Respiratory:  Positive for shortness of breath. Negative for cough, hemoptysis, sputum production and wheezing.   Cardiovascular:  Negative for chest pain, palpitations, orthopnea and leg swelling.   Gastrointestinal:  Negative for abdominal pain, blood in stool, constipation, diarrhea, heartburn, melena, nausea and vomiting.  Genitourinary:  Negative for dysuria, frequency and urgency.  Musculoskeletal:  Negative for back pain and joint pain.  Skin: Negative.  Negative for itching and rash.  Neurological:  Positive for tingling. Negative for dizziness, focal weakness, weakness and headaches.  Endo/Heme/Allergies:  Does not bruise/bleed easily.  Psychiatric/Behavioral:  Negative for depression. The patient is not nervous/anxious and does not have insomnia.     MEDICAL HISTORY:  Past Medical History:  Diagnosis Date   Degenerative arthritis of knee    Depression    Diverticulitis    Glaucoma    since 2004   Hernia 1979   History of kidney stones    Hypertension    Personal history of tobacco use, presenting hazards to health    Pre-diabetes    Screening for obesity    Special screening for malignant neoplasms, colon    Wears dentures    full upper and lower    SURGICAL HISTORY: Past Surgical History:  Procedure Laterality Date   [place], 2012   lumbar bulging disc   CATARACT EXTRACTION W/PHACO Left 01/20/2021   Procedure: CATARACT EXTRACTION PHACO AND INTRAOCULAR LENS PLACEMENT ([place]) LEFT;  Surgeon: Birder Robson, MD;  Location: [place];  Service: Ophthalmology;  Laterality: Left;  10.13 0:52.1   COLONOSCOPY  5621,3086   [facility]/Dr. Buelah Manis sessile polyp,79mm, found in rectum,multiple diverticula were found in the sigmoid, in descending and in transverse colon   COLOSTOMY  04-20-13   HERNIA REPAIR  1979   inguinal   JOINT REPLACEMENT Right  knee   KNEE ARTHROPLASTY Right 05/08/2018   Procedure: COMPUTER ASSISTED TOTAL KNEE ARTHROPLASTY;  Surgeon: Dereck Leep, MD;  Location: [facility];  Service: Orthopedics;  Laterality: Right;   KNEE ARTHROPLASTY Left 11/16/2019   Procedure: COMPUTER ASSISTED TOTAL KNEE ARTHROPLASTY;  Surgeon: Dereck Leep,  MD;  Location: [facility];  Service: Orthopedics;  Laterality: Left;   LAPAROTOMY  04-20-13   colon resection with colosotmy   LITHOTRIPSY  2013   TONSILLECTOMY AND ADENOIDECTOMY  1962    SOCIAL HISTORY: Social History   Socioeconomic History   Marital status: Single    Spouse name: Not on file   Number of children: Not on file   Years of education: Not on file   Highest education level: Not on file  Occupational History   Not on file  Tobacco Use   Smoking status: Every Day    Packs/day: 1.00    Years: 20.00    Pack years: 20.00    Types: Cigarettes   Smokeless tobacco: Never   Tobacco comments:    since age 37 or 16  Vaping Use   Vaping Use: Never used  Substance and Sexual Activity   Alcohol use: Yes    Comment: occassional   Drug use: Yes    Types: Marijuana   Sexual activity: Not on file  Other Topics Concern   Not on file  Social History Narrative   15 mins [place]; smoking; not much alcohol. Worked for [employer], 2019. Marland Kitchen    Social Determinants of Health   Financial Resource Strain: Not on file  Food Insecurity: Not on file  Transportation Needs: Not on file  Physical Activity: Not on file  Stress: Not on file  Social Connections: Not on file  Intimate Partner Violence: Not on file    FAMILY HISTORY: Family History  Problem Relation Age of Onset   Diabetes Mother    Heart disease Mother    Heart attack Father    Prostate cancer Father     ALLERGIES:  is allergic to aleve [naproxen sodium].  MEDICATIONS:  Current Outpatient Medications  Medication Sig Dispense Refill   acetaminophen (TYLENOL) 500 MG tablet Take 1,000 mg by mouth daily.     cetirizine (ZYRTEC) 10 MG tablet Take 10 mg by mouth daily.     diphenhydramine-acetaminophen (TYLENOL PM) 25-500 MG TABS Take 1-2 tablets by mouth at bedtime as needed (sleep.).      dorzolamide-timolol (COSOPT) 22.3-6.8 MG/ML ophthalmic solution Place 1 drop into both eyes 2 (two) times  daily.     guaiFENesin (MUCINEX) 600 MG 12 hr tablet Take by mouth 2 (two) times daily as needed.     latanoprost (XALATAN) 0.005 % ophthalmic solution Place 1 drop into both eyes at bedtime.     olmesartan (BENICAR) 40 MG tablet Take 40 mg by mouth daily.     venlafaxine XR (EFFEXOR-XR) 75 MG 24 hr capsule Take 225 mg by mouth daily.     Cyanocobalamin (VITAMIN B-12 PO) Take by mouth daily. (Patient not taking: Reported on 05/20/2021)     No current facility-administered medications for this visit.      Marland Kitchen  PHYSICAL EXAMINATION: ECOG PERFORMANCE STATUS: 1 - Symptomatic but completely ambulatory  Vitals:   05/20/21 1341  BP: (!) 156/92  Pulse: 98  Resp: 18  Temp: 98 F (36.7 C)  SpO2: 100%   Filed Weights   05/20/21 1341  Weight: 203 lb 8 oz (92.3 kg)  Physical Exam Vitals and nursing note reviewed.  Constitutional:      Comments: With wife. Ambulating independently.  HENT:     Head: Normocephalic and atraumatic.     Mouth/Throat:     Pharynx: Oropharynx is clear.  Eyes:     Extraocular Movements: Extraocular movements intact.     Pupils: Pupils are equal, round, and reactive to light.  Cardiovascular:     Rate and Rhythm: Normal rate and regular rhythm.  Pulmonary:     Comments: Decreased breath sounds bilaterally.  Abdominal:     Palpations: Abdomen is soft.  Musculoskeletal:        General: Normal range of motion.     Cervical back: Normal range of motion.  Skin:    General: Skin is warm.  Neurological:     General: No focal deficit present.     Mental Status: He is alert and oriented to person, place, and time.  Psychiatric:        Behavior: Behavior normal.        Judgment: Judgment normal.     LABORATORY DATA:  I have reviewed the data as listed Lab Results  Component Value Date   WBC 7.7 05/20/2021   HGB 13.8 05/20/2021   HCT 39.8 05/20/2021   MCV 89.6 05/20/2021   PLT 235 05/20/2021   Recent Labs    05/19/21 0755 05/20/21 1443  NA  --   137  K  --  3.4*  CL  --  100  CO2  --  30  GLUCOSE  --  228*  BUN  --  7*  CREATININE 1.10 1.11  CALCIUM  --  9.0  GFRNONAA  --  >60  PROT  --  7.5  ALBUMIN  --  4.1  AST  --  18  ALT  --  10  ALKPHOS  --  112  BILITOT  --  0.4    RADIOGRAPHIC STUDIES: I have personally reviewed the radiological images as listed and agreed with the findings in the report. CT CHEST W CONTRAST  Result Date: 05/19/2021 CLINICAL DATA:  Abnormal chest x-ray.  Cough. EXAM: CT CHEST WITH CONTRAST TECHNIQUE: Multidetector CT imaging of the chest was performed during intravenous contrast administration. CONTRAST:  62mL OMNIPAQUE IOHEXOL 350 MG/ML SOLN COMPARISON:  None available currently. FINDINGS: Cardiovascular: Atherosclerosis of thoracic aorta is noted without aneurysm or dissection. Normal cardiac size. No pericardial effusion. Mild coronary artery calcifications are noted. Mediastinum/Nodes: Thyroid gland and esophagus are unremarkable. 8 mm precarinal lymph node is noted. 8 mm subcarinal lymph node is noted. 12 mm right hilar lymph node is noted. Lungs/Pleura: No pneumothorax or pleural effusion is noted. Mild left apical scarring is noted. 8.2 x 5.9 cm spiculated mass is noted in right upper lobe which extends from the right hilum to the lateral chest wall and results in lytic destruction of the lateral portion of the right fourth rib. Upper Abdomen: 3 cm right adrenal mass is noted concerning for metastatic disease. Musculoskeletal: As noted previously, there is lytic destruction of the lateral portion of the right fourth rib secondary to right upper lobe malignancy. IMPRESSION: 8.2 x 5.9 cm spiculated mass noted in right upper lobe consistent with malignancy. It extends from the right hilum to the lateral chest wall and results in lytic destruction of the lateral portion of the right fourth rib. These results will be called to the ordering clinician or representative by the Radiologist Assistant, and  communication documented in the PACS or  zVision Dashboard. Mediastinal lymph nodes are noted, including 12 mm right hilar lymph node, consistent with metastatic disease. 3 cm right adrenal mass is noted concerning for metastatic disease. Mild coronary artery calcifications are noted. Aortic Atherosclerosis (ICD10-I70.0) and Emphysema (ICD10-J43.9). Electronically Signed   By: Marijo Conception M.D.   On: 05/19/2021 09:04    ASSESSMENT & PLAN:   Mass of upper lobe of right lung #Right upper lobe lung mass -concerning for malignancy.  8.2 x 5.9 cm -extends from the right hilum to the lateral chest wall and results in lytic destruction of the lateral portion of the right fourth rib; also noted to have mediastinal adenopathy/hilar lymph nodes; and a 3 cm adrenal mass on the right.   #Discussed with the patient advised that given findings concerning for malignancy -I would recommend PET scan for further evaluation/staging. Patient will need biopsy for confirmation of malignancy /NGS testing.  Discussed with IR; CT-guided biopsy of pulm mass ordered.  #Await prognosis/staging while waiting on the PET scan/biopsy.  We will have the patient follow-up after the biopsy to review the treatment plan/plan of care. Patient also need brain MRI.   #Right chest wall pain- right improved on Tylenol prn.  Continue NSAIDs for now #Smoking:  #Clinically COPD-based on imaging findings/history.  Will order inhalers at next visit.  #Prediabetes: We need to monitor blood sugar levels on therapies.  Thank you Dr.Hedrick Ms.Ccarter for allowing me to participate in the care of your pleasant patient. Please do not hesitate to contact me with questions or concerns in the interim.  Discussed with [facility].  # DISPOSITION: # labs today- cbc/cmp/CEA/LDH/PT-PTT # PET ASAP # follow up TBD- Dr.B  # I reviewed the blood work- with the patient in detail; also reviewed the imaging independently [as summarized above]; and with  the patient in detail.     All questions were answered. The patient knows to call the clinic with any problems, questions or concerns.       [name], MD 05/20/2021 3:43 PM

## 2021-05-20 NOTE — Assessment & Plan Note (Addendum)
#  Right upper lobe lung mass -concerning for malignancy.  8.2 x 5.9 cm -extends from the right hilum to the lateral chest wall and results in lytic destruction of the lateral portion of the right fourth rib; also noted to have mediastinal adenopathy/hilar lymph nodes; and a 3 cm adrenal mass on the right.   #Discussed with the patient advised that given findings concerning for malignancy -I would recommend PET scan for further evaluation/staging. Patient will need biopsy for confirmation of malignancy /NGS testing.  Discussed with IR; CT-guided biopsy of pulm mass ordered.  #Await prognosis/staging while waiting on the PET scan/biopsy.  We will have the patient follow-up after the biopsy to review the treatment plan/plan of care. Patient also need brain MRI.   #Right chest wall pain- right improved on Tylenol prn.  Continue NSAIDs for now #Smoking:  #Clinically COPD-based on imaging findings/history.  Will order inhalers at next visit.  #Prediabetes: We need to monitor blood sugar levels on therapies.  Thank you Dr.Hedrick Ms.Ccarter for allowing me to participate in the care of your pleasant patient. Please do not hesitate to contact me with questions or concerns in the interim.  Discussed with [facility].  # DISPOSITION: # labs today- cbc/cmp/CEA/LDH/PT-PTT # PET ASAP # follow up TBD- Dr.B  # I reviewed the blood work- with the patient in detail; also reviewed the imaging independently [as summarized above]; and with the patient in detail.

## 2021-05-21 ENCOUNTER — Telehealth: Payer: Self-pay | Admitting: Internal Medicine

## 2021-05-21 LAB — CEA: CEA: 6.1 ng/mL — ABNORMAL HIGH (ref 0.0–4.7)

## 2021-05-21 NOTE — Telephone Encounter (Signed)
On 8/18  ; I called pt 's daughter re: update. Unable to reach.  I left VM to call us back to discuss.  GB

## 2021-05-21 NOTE — Telephone Encounter (Signed)
Chinita Pester, daughter, can be reached at [phone]

## 2021-05-22 ENCOUNTER — Other Ambulatory Visit: Payer: Self-pay | Admitting: *Deleted

## 2021-05-22 ENCOUNTER — Encounter: Payer: Self-pay | Admitting: Internal Medicine

## 2021-05-22 MED ORDER — TRAMADOL HCL 50 MG PO TABS: 50.0000 mg | ORAL_TABLET | Freq: Three times a day (TID) | ORAL | 0 refills | Status: DC | PRN

## 2021-05-22 MED ORDER — TRAZODONE HCL 50 MG PO TABS: 50.0000 mg | ORAL_TABLET | Freq: Every evening | ORAL | 1 refills | Status: DC | PRN

## 2021-05-22 NOTE — Telephone Encounter (Signed)
Daughter sent mychart msg   Dr. Rogue Bussing,  This is Chinita Pester, Mariane Duval' daughter. I returned your call this morning and I will have my phone on me today. I am in clinic but I will be on the look out for your call. I did want to thank you for being available to the family and also for your "bedside manner" with my dad.  He was less anxious after meeting with you.  Thank you,  Chinita Pester, [phone]

## 2021-05-22 NOTE — Addendum Note (Signed)
Addended by: [name] on: 05/22/2021 11:36 AM   Modules accepted: Orders

## 2021-05-23 ENCOUNTER — Other Ambulatory Visit: Payer: Self-pay | Admitting: Internal Medicine

## 2021-05-23 MED ORDER — NICOTINE 21 MG/24HR TD PT24: 21.0000 mg | MEDICATED_PATCH | Freq: Every day | TRANSDERMAL | 1 refills | Status: DC

## 2021-05-23 NOTE — Progress Notes (Signed)
Spoke to daughter sandra at length re: imaging/concerns for malignancy on imaging. Discussed re: nutrition/life style changes with current diagnosis. Discussed re: smoking cessation [order patches]; pain control/insomnia/medications.  FYI-GB

## 2021-05-25 ENCOUNTER — Ambulatory Visit
Admission: RE | Admit: 2021-05-25 | Discharge: 2021-05-25 | Disposition: A | Discharge status: Home / Self Care | Payer: Medicare PPO | Source: Ambulatory Visit | Attending: Internal Medicine | Admitting: Internal Medicine

## 2021-05-25 DIAGNOSIS — R918 Other nonspecific abnormal finding of lung field: Secondary | ICD-10-CM | POA: Insufficient documentation

## 2021-05-25 HISTORY — DX: Malignant (primary) neoplasm, unspecified: C80.1

## 2021-05-25 LAB — GLUCOSE, CAPILLARY: Glucose-Capillary: 144 mg/dL — ABNORMAL HIGH (ref 70–99)

## 2021-05-25 MED ORDER — FLUDEOXYGLUCOSE F - 18 (FDG) INJECTION
10.5000 | Freq: Once | INTRAVENOUS | Status: AC | PRN
  Administered 2021-05-25: 11.25 via INTRAVENOUS

## 2021-05-26 NOTE — Progress Notes (Signed)
Patient on schedule for Lung biospy 05/29/2021, called and spoke with patient on phone and wife with pre procedure instructions given. Made aware to be here @ 0800, NPO after MN prior to procedure, and driver post procedure/recovery/discharge/stated understanding.

## 2021-05-27 ENCOUNTER — Other Ambulatory Visit: Payer: Medicare PPO

## 2021-05-28 ENCOUNTER — Other Ambulatory Visit: Payer: Self-pay | Admitting: Radiology

## 2021-05-29 ENCOUNTER — Ambulatory Visit
Admission: RE | Admit: 2021-05-29 | Discharge: 2021-05-29 | Disposition: A | Discharge status: Home / Self Care | Payer: Medicare PPO | Source: Ambulatory Visit | Attending: Internal Medicine | Admitting: Internal Medicine

## 2021-05-29 ENCOUNTER — Telehealth: Payer: Self-pay | Admitting: *Deleted

## 2021-05-29 ENCOUNTER — Ambulatory Visit
Admission: RE | Admit: 2021-05-29 | Discharge: 2021-05-29 | Disposition: A | Discharge status: Home / Self Care | Payer: Medicare PPO | Source: Ambulatory Visit | Attending: Interventional Radiology | Admitting: Interventional Radiology

## 2021-05-29 DIAGNOSIS — J95811 Postprocedural pneumothorax: Secondary | ICD-10-CM | POA: Diagnosis present

## 2021-05-29 DIAGNOSIS — R918 Other nonspecific abnormal finding of lung field: Secondary | ICD-10-CM

## 2021-05-29 MED ORDER — FENTANYL CITRATE (PF) 100 MCG/2ML IJ SOLN
INTRAMUSCULAR | Status: AC | PRN
  Administered 2021-05-29: 25 ug via INTRAVENOUS

## 2021-05-29 MED ORDER — MIDAZOLAM HCL 2 MG/2ML IJ SOLN
INTRAMUSCULAR | Status: AC
  Filled 2021-05-29: qty 2

## 2021-05-29 MED ORDER — SODIUM CHLORIDE 0.9 % IV SOLN: INTRAVENOUS | Status: DC

## 2021-05-29 MED ORDER — MIDAZOLAM HCL 2 MG/2ML IJ SOLN
INTRAMUSCULAR | Status: AC | PRN
  Administered 2021-05-29: 0.5 mg via INTRAVENOUS

## 2021-05-29 MED ORDER — FENTANYL CITRATE (PF) 100 MCG/2ML IJ SOLN
INTRAMUSCULAR | Status: AC
  Filled 2021-05-29: qty 2

## 2021-05-29 NOTE — H&P (Signed)
Interventional Radiology - Consult H&P    Referring Provider (current admission): [name], MD  Reason for Consult: Right lung mass     History of Present Illness  Rodney Vega is a 65 y.o. male with a relevant past medical history of right lung mass seen in consultation for Ct guided right lung biopsy.     Additional Past Medical History Past Medical History:  Diagnosis Date   Cancer ([place])    Degenerative arthritis of knee    Depression    Diverticulitis    Glaucoma    since 2004   Hernia 1979   History of kidney stones    Hypertension    Personal history of tobacco use, presenting hazards to health    Pre-diabetes    Screening for obesity    Special screening for malignant neoplasms, colon    Wears dentures    full upper and lower    Surgical History  Past Surgical History:  Procedure Laterality Date   BACK SURGERY  1994, 2012   lumbar bulging disc   CATARACT EXTRACTION W/PHACO Left 01/20/2021   Procedure: CATARACT EXTRACTION PHACO AND INTRAOCULAR LENS PLACEMENT ([place]) LEFT;  Surgeon: Birder Robson, MD;  Location: [place];  Service: Ophthalmology;  Laterality: Left;  10.13 0:52.1   COLONOSCOPY  1093,2355   [facility]/Dr. Buelah Manis sessile polyp,57mm, found in rectum,multiple diverticula were found in the sigmoid, in descending and in transverse colon   COLOSTOMY  04-20-13   [place]   inguinal   JOINT REPLACEMENT Right    knee   KNEE ARTHROPLASTY Right 05/08/2018   Procedure: COMPUTER ASSISTED TOTAL KNEE ARTHROPLASTY;  Surgeon: Dereck Leep, MD;  Location: [facility];  Service: Orthopedics;  Laterality: Right;   KNEE ARTHROPLASTY Left 11/16/2019   Procedure: COMPUTER ASSISTED TOTAL KNEE ARTHROPLASTY;  Surgeon: Dereck Leep, MD;  Location: [facility];  Service: Orthopedics;  Laterality: Left;   LAPAROTOMY  04-20-13   colon resection with colosotmy   LITHOTRIPSY  2013   [place]     Medications  I have  reviewed the current medication list. Refer to chart for details. Current Outpatient Medications  Medication Instructions   acetaminophen (TYLENOL) 1,000 mg, Oral, Daily   cetirizine (ZYRTEC) 10 mg, Oral, Daily   Cyanocobalamin (VITAMIN B-12 PO) Daily   diphenhydramine-acetaminophen (TYLENOL PM) 25-500 MG TABS 1-2 tablets, Oral, At bedtime PRN   dorzolamide-timolol (COSOPT) 22.3-6.8 MG/ML ophthalmic solution 1 drop, Both Eyes, 2 times daily   guaiFENesin (MUCINEX) 600 MG 12 hr tablet Oral, 2 times daily PRN   latanoprost (XALATAN) 0.005 % ophthalmic solution 1 drop, Both Eyes, Daily at bedtime   nicotine (NICODERM CQ - DOSED IN MG/24 HOURS) 21 mg, Transdermal, Daily   olmesartan (BENICAR) 40 mg, Oral, Daily   traMADol (ULTRAM) 50 mg, Oral, Every 8 hours PRN   traZODone (DESYREL) 50 mg, Oral, At bedtime PRN   venlafaxine XR (EFFEXOR-XR) 225 mg, Oral, Daily     I have reviewed prior sedation medication usage: Yes    Allergies Allergies  Allergen Reactions   Aleve [Naproxen Sodium] Itching   Does patient have contrast allergy: No     Physical Exam Current Vitals Temp: 98 F (36.7 C) (Temp Source: Oral)  Pulse Rate: 85  Resp: 15  BP: (!) 182/95  SpO2: 99 %  Height: 5\' 11"  (180.3 cm)  Weight: 92.1 kg  Body mass index is 28.31 kg/m.  General: Alert and answers questions appropriately. No  apparent distress. HEENT: Normocephalic, atraumatic. Conjunctivae normal without scleral icterus. Mallampati score: I (soft palate, uvula, fauces, and tonsillar pillars visible) Cardiac: Regular rate and rhythm. No dependent edema. Pulmonary: Normal work of breathing. On room air. Abdominal: Soft without distension. Extremities: Normally-formed, well perfused.    Pertinent Lab Results Labs: CBC:    BMP:   Coagulation:    CBC Trends: No results for input(s): WBC, HGB, HCT, PLT in the last 72 hours.   Creatinine Trend: No results for input(s): CREATININE in the last 72  hours.   Relevant and/or Recent Imaging: CT/PET showing right lung mass, large necrotic center    Assessment & Plan Rodney Vega is a 65 y.o. male seen in consultation for CT guided biopsy of right lung mass.  Risks including bleeding, infection and pneumothorax were described. Patient understand and agrees to proceed after being given time to ask questions.    Procedure Checklist:  Consent obtained: Risks of the procedure as well as the alternatives and risks of each were explained to the patient and/or caregiver.  Consent for the procedure was obtained and is signed in the bedside chart Consent obtained from: The patient Patient is appropriate candidate for sedation Yes ASA Classification: ASA 2 - Patient with mild systemic disease with no functional limitations NPO status: 0000  Code status:   Code Status: Prior Pre-procedural prep necessary: none      Albin Felling, MD  Vascular and Interventional Radiology 05/29/2021 10:06 AM

## 2021-05-29 NOTE — Procedures (Signed)
Interventional Radiology Procedure Note  Date of Procedure: 05/29/2021  Procedure: CT guided right lung biopsy   Findings:  1. Successful CT guided core needle biopsy of right lung mass, 18ga x5 passes    Complications: No immediate complications noted.   Estimated Blood Loss: minimal  Follow-up and Recommendations: 1. CXR 1 hour    Albin Felling, MD  Vascular & Interventional Radiology  05/29/2021 10:01 AM

## 2021-05-29 NOTE — Telephone Encounter (Signed)
Per pt's wife, pt's daughter that is involved with his care would like to come to his appt on Wed with pt and his wife present to discuss biopsy results and treatment options. Informed that may be able to use consult room downstairs if okay with Dr. Rogue Bussing for this one visit. Informed that will relay message to Dr. Jacinto Reap and his clinical team.

## 2021-06-01 ENCOUNTER — Telehealth: Payer: Self-pay | Admitting: *Deleted

## 2021-06-01 NOTE — Telephone Encounter (Signed)
Pt continues to have right shoulder pain that he describes as "nagging." He has been taking Tramadol but it has not helped relieve his pain recently. Pt is asking if there is another medication he can try that is stronger than Tramadol but not an opioid.   Pt advised to try Tramadol with Tylenol to see if that helps his "nagging" pain but would discuss with Dr. B if there is a stronger pain medication he could try as well.   Please advise.

## 2021-06-01 NOTE — Telephone Encounter (Signed)
[name]- please advise

## 2021-06-01 NOTE — Telephone Encounter (Signed)
Message left with patient to continue tramadol + tylenol or increase tramadol to 100mg  q6hours prn as recommended by [name]. Instructed to call back with any further questions or needs.

## 2021-06-02 ENCOUNTER — Other Ambulatory Visit: Payer: Self-pay | Admitting: Internal Medicine

## 2021-06-02 LAB — SURGICAL PATHOLOGY

## 2021-06-03 ENCOUNTER — Inpatient Hospital Stay (HOSPITAL_BASED_OUTPATIENT_CLINIC_OR_DEPARTMENT_OTHER): Payer: Medicare PPO | Admitting: Internal Medicine

## 2021-06-03 ENCOUNTER — Encounter: Payer: Self-pay | Admitting: *Deleted

## 2021-06-03 ENCOUNTER — Ambulatory Visit
Admission: RE | Admit: 2021-06-03 | Discharge: 2021-06-03 | Disposition: A | Discharge status: Home / Self Care | Payer: Medicare PPO | Source: Ambulatory Visit | Attending: Radiation Oncology | Admitting: Radiation Oncology

## 2021-06-03 ENCOUNTER — Encounter: Payer: Self-pay | Admitting: Radiation Oncology

## 2021-06-03 VITALS — BP 162/91 | HR 86 | Temp 97.6°F | Resp 16 | Wt 202.3 lb

## 2021-06-03 DIAGNOSIS — M13869 Other specified arthritis, unspecified knee: Secondary | ICD-10-CM | POA: Insufficient documentation

## 2021-06-03 DIAGNOSIS — Z79899 Other long term (current) drug therapy: Secondary | ICD-10-CM | POA: Diagnosis not present

## 2021-06-03 DIAGNOSIS — Z87442 Personal history of urinary calculi: Secondary | ICD-10-CM | POA: Insufficient documentation

## 2021-06-03 DIAGNOSIS — C3490 Malignant neoplasm of unspecified part of unspecified bronchus or lung: Secondary | ICD-10-CM | POA: Diagnosis not present

## 2021-06-03 DIAGNOSIS — I10 Essential (primary) hypertension: Secondary | ICD-10-CM | POA: Insufficient documentation

## 2021-06-03 DIAGNOSIS — F17210 Nicotine dependence, cigarettes, uncomplicated: Secondary | ICD-10-CM | POA: Insufficient documentation

## 2021-06-03 DIAGNOSIS — R918 Other nonspecific abnormal finding of lung field: Secondary | ICD-10-CM | POA: Insufficient documentation

## 2021-06-03 DIAGNOSIS — Z8042 Family history of malignant neoplasm of prostate: Secondary | ICD-10-CM | POA: Insufficient documentation

## 2021-06-03 DIAGNOSIS — C3411 Malignant neoplasm of upper lobe, right bronchus or lung: Secondary | ICD-10-CM

## 2021-06-03 DIAGNOSIS — Z8719 Personal history of other diseases of the digestive system: Secondary | ICD-10-CM | POA: Diagnosis not present

## 2021-06-03 MED ORDER — ALBUTEROL SULFATE HFA 108 (90 BASE) MCG/ACT IN AERS: 2.0000 | INHALATION_SPRAY | Freq: Four times a day (QID) | RESPIRATORY_TRACT | 2 refills | Status: DC | PRN

## 2021-06-03 NOTE — Progress Notes (Signed)
Met with patient and his family during follow up visit to discuss biopsy results and treatment options. All questions answered during visit. Reviewed upcoming appts with patient. Informed will be notified with appt for port placement once scheduled. Instructed pt to call with any further questions or needs Pt verbalized understanding. Nothing further needed at this time.

## 2021-06-03 NOTE — Progress Notes (Signed)
Patient on schedule for Port placement 06/10/2021, called and spoke with patient/friend on phone with pre procedure instructions given. Made aware to be here @ 1000, NPO after MN prior to procedure and driver post procedure/recovery/discharge. Stated understanding.

## 2021-06-03 NOTE — Assessment & Plan Note (Addendum)
#  Right upper lobe lung mass -concerning for malignancy.  8.2 x 5.9 cm -extends from the right hilum to the lateral chest wall and results in lytic destruction of the lateral portion of the right fourth rib; also noted to have mediastinal adenopathy/hilar lymph nodes; and a 3 cm adrenal mass on the right.  Recommend MRI of the brain.  August 2022-PET scan avoid lung findings; adrenal lesion non-avid.   #Biopsy unfortunately not definitive-however atypical cells suggestive of malignancy noted-no necrosis.  Discussed regarding repeating a biopsy however patient will need further invasive testing like bronchoscopy.  Given the clinical picture [imaging-]; I think is reasonable to proceed with concurrent chemoradiation without any repeat biopsy.   # Long discussion with the patient regarding the goal of treatment of stage III lung cancer-goal is cure; however only 10-20% of the patients are cured.  Patient has unresectable disease.  #Discussed the role of concurrent chemoradiation [weekly carbotaxol with the daily radiation/Monday through Friday ~6 weeks].  Reviewed with the patient the potential side effects of radiation include skin rash; radiation esophagitis/pneumonitis.   # Post chemoradiation-consolidation immunotherapy with durvalumab every 2 weeks is recommended based on data from [facility] clinical trial.  #Right chest wall pain-clinically stable on Tylenol/tramadol.  # COPD-prescribed albuterol.  #Smoking: Counseled regarding quitting smoking.  #Prediabetes: We need to monitor blood sugar levels on therapies.  #IV access-recommend port implantation.  # DISPOSITION: # Chemo education # port referral # follow up on SEP 12th, MD- labs- cbc/cmp;carbo-taxol-Dr.B   # I reviewed the blood work- with the patient in detail; also reviewed the imaging independently [as summarized above]; and with the patient in detail.    # 40 minutes face-to-face with the patient discussing the above plan of  care; more than 50% of time spent on prognosis/ natural history; counseling and coordination.

## 2021-06-03 NOTE — Progress Notes (Signed)
[place] NOTE  Patient Care Team: [name], [name], [name], [name]    Oncology History Overview Note  AUG 2022- 8.2 x 5.9 cm spiculated mass noted in right upper lobe consistent with malignancy. It extends from the right hilum to the lateral chest wall and results in lytic destruction of the lateral portion of the right fourth rib.  Mediastinal lymph nodes are noted, including 12 mm right hilar lymph node, consistent with metastatic disease. 3 cm right adrenal mass is noted concerning for metastatic disease.  # AUG 2022- PET IMPRESSION: Peripherally hypermetabolic right upper lobe mass, likely due to central necrosis. Mass extends into the right hilum and right lateral chest wall involving the second through fourth right ribs.   Mildly enlarged and hypermetabolic right hilar lymph node.   No evidence of metastatic disease in the abdomen or pelvis.  # AUG, 2022-RIGHT CHEST WALL Bx-necrosis; suspicious for malignancy not definitive [discussed with Dr.Kraynie;MDT] LUNG MASS, RIGHT; BIOPSY:  - SUSPICIOUS FOR MALIGNANCY.  - SEE COMMENT.   Comment:  Approximately six tissue cores demonstrate inflamed fibrous capsule with  hemosiderin-laden macrophages, in a background of abundant necrosis.  One of the cores demonstrates a minute fragment of viable epithelial  cells.  These cells are positive for p40.  They are negative for TTF1  and CK7. The cells of interest disappear on deeper cut levels.  While  the findings are suspicious for squamous cell carcinoma, there is not  enough viable tumor for a definitive diagnosis.  Additional tissue  sampling may be helpful.    Primary cancer of right upper lobe of lung ([place])  06/03/2021 Initial Diagnosis   Primary cancer of right  upper lobe of lung ([place])   06/03/2021 Cancer Staging   Staging form: Lung, AJCC 8th Edition - Clinical: Stage IIIB (cT3, cN2, cM0) - Signed by [name], MD on 06/03/2021   06/09/2021 -  Chemotherapy    Patient is on Treatment Plan: LUNG CARBOPLATIN / PACLITAXEL + XRT Q7D          HISTORY OF PRESENTING ILLNESS: Ambulating independently.  Accompanied by daughter and wife. Rodney Vega 65 y.o.  male history of smoking is here today with results of the chest wall mass biopsy.  He continues to have right chest wall pain-fairly controlled with tramadol/NSAIDs.  Denies any nausea vomiting.  Review of Systems  Constitutional:  Negative for chills, diaphoresis, fever, malaise/fatigue and weight loss.  HENT:  Negative for nosebleeds and sore throat.   Eyes:  Negative for double vision.  Respiratory:  Positive for shortness of breath. Negative for cough, hemoptysis, sputum production and wheezing.   Cardiovascular:  Negative for chest pain, palpitations, orthopnea and leg swelling.  Gastrointestinal:  Negative for abdominal pain, blood in stool, constipation, diarrhea, heartburn, melena, nausea and vomiting.  Genitourinary:  Negative for dysuria, frequency and urgency.  Musculoskeletal:  Negative for back pain and joint pain.  Skin: Negative.  Negative for itching and rash.  Neurological:  Positive for tingling. Negative for dizziness, focal weakness, weakness and headaches.  Endo/Heme/Allergies:  Does not bruise/bleed easily.  Psychiatric/Behavioral:  Negative for depression. The patient is not nervous/anxious and does not have insomnia.     MEDICAL HISTORY:  Past Medical History:  Diagnosis Date  . Cancer ([place])   . Degenerative arthritis of knee   .  Depression   . Diverticulitis   . Glaucoma    since 2004  . Hernia 1979  . History of kidney stones   . Hypertension   . Personal history of tobacco use, presenting hazards to health   . Pre-diabetes   . Screening for  obesity   . Special screening for malignant neoplasms, colon   . Wears dentures    full upper and lower    SURGICAL HISTORY: Past Surgical History:  Procedure Laterality Date  . [place], 2012   lumbar bulging disc  . CATARACT EXTRACTION W/PHACO Left 01/20/2021   Procedure: CATARACT EXTRACTION PHACO AND INTRAOCULAR LENS PLACEMENT ([place]) LEFT;  Surgeon: Birder Robson, MD;  Location: [facility];  Service: Ophthalmology;  Laterality: Left;  10.13 0:52.1  . COLONOSCOPY  0109,3235   [facility]/Dr. Buelah Manis sessile polyp,109mm, found in rectum,multiple diverticula were found in the sigmoid, in descending and in transverse colon  . COLOSTOMY  04-20-13  . HERNIA REPAIR  1979   inguinal  . JOINT REPLACEMENT Right    knee  . KNEE ARTHROPLASTY Right 05/08/2018   Procedure: COMPUTER ASSISTED TOTAL KNEE ARTHROPLASTY;  Surgeon: Dereck Leep, MD;  Location: [facility];  Service: Orthopedics;  Laterality: Right;  . KNEE ARTHROPLASTY Left 11/16/2019   Procedure: COMPUTER ASSISTED TOTAL KNEE ARTHROPLASTY;  Surgeon: Dereck Leep, MD;  Location: [facility];  Service: Orthopedics;  Laterality: Left;  . LAPAROTOMY  04-20-13   colon resection with colosotmy  . LITHOTRIPSY  2013  . TONSILLECTOMY AND ADENOIDECTOMY  1962    SOCIAL HISTORY: Social History   Socioeconomic History  . Marital status: Single    Spouse name: Not on file  . Number of children: Not on file  . Years of education: Not on file  . Highest education level: Not on file  Occupational History  . Not on file  Tobacco Use  . Smoking status: Every Day    Packs/day: 1.00    Years: 20.00    Pack years: 20.00    Types: Cigarettes  . Smokeless tobacco: Never  . Tobacco comments:    since age 47 or 86  Vaping Use  . Vaping Use: Never used  Substance and Sexual Activity  . Alcohol use: Yes    Comment: occassional  . Drug use: Yes    Types: Marijuana  . Sexual activity: Not on file  Other Topics Concern  . Not on  file  Social History Narrative   15 mins [place]; smoking; not much alcohol. Worked for [employer], 2019. 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 Diabetes Mother   . Heart disease Mother   . Heart attack Father   . Prostate cancer Father     ALLERGIES:  is allergic to aleve [naproxen sodium].  MEDICATIONS:  Current Outpatient Medications  Medication Sig Dispense Refill  . albuterol (VENTOLIN HFA) 108 (90 Base) MCG/ACT inhaler Inhale 2 puffs into the lungs every 6 (six) hours as needed for wheezing or shortness of breath. 1 each 2  . acetaminophen (TYLENOL) 500 MG tablet Take 1,000 mg by mouth daily.    . cetirizine (ZYRTEC)  10 MG tablet Take 10 mg by mouth daily.    . Cyanocobalamin (VITAMIN B-12 PO) Take by mouth daily. (Patient not taking: No sig reported)    . diphenhydramine-acetaminophen (TYLENOL PM) 25-500 MG TABS Take 1-2 tablets by mouth at bedtime as needed (sleep.).     Marland Kitchen dorzolamide-timolol (COSOPT) 22.3-6.8 MG/ML ophthalmic solution Place 1 drop into both eyes 2 (two) times daily.    Marland Kitchen guaiFENesin (MUCINEX) 600 MG 12 hr tablet Take by mouth 2 (two) times daily as needed.    . latanoprost (XALATAN) 0.005 % ophthalmic solution Place 1 drop into both eyes at bedtime.    . nicotine (NICODERM CQ - DOSED IN MG/24 HOURS) 21 mg/24hr patch Place 1 patch (21 mg total) onto the skin daily. 28 patch 1  . olmesartan (BENICAR) 40 MG tablet Take 40 mg by mouth daily.    . traMADol (ULTRAM) 50 MG tablet TAKE 1 TABLET(50 MG) BY MOUTH EVERY 8 HOURS AS NEEDED 60 tablet 0  . traZODone (DESYREL) 50 MG tablet Take 1 tablet (50 mg total) by mouth at bedtime as needed for sleep.  30 tablet 1  . venlafaxine XR (EFFEXOR-XR) 75 MG 24 hr capsule Take 225 mg by mouth daily.     No current facility-administered medications for this visit.      Marland Kitchen  PHYSICAL EXAMINATION: ECOG PERFORMANCE STATUS: 1 - Symptomatic but completely ambulatory  There were no vitals filed for this visit.  There were no vitals filed for this visit.   Physical Exam Vitals and nursing note reviewed.  HENT:     Head: Normocephalic and atraumatic.     Mouth/Throat:     Pharynx: Oropharynx is clear.  Eyes:     Extraocular Movements: Extraocular movements intact.     Pupils: Pupils are equal, round, and reactive to light.  Cardiovascular:     Rate and Rhythm: Normal rate and regular rhythm.  Pulmonary:     Comments: Decreased breath sounds bilaterally.  Abdominal:     Palpations: Abdomen is soft.  Musculoskeletal:        General: Normal range of motion.     Cervical back: Normal range of motion.  Skin:    General: Skin is warm.  Neurological:     General: No focal deficit present.     Mental Status: He is alert and oriented to person, place, and time.  Psychiatric:        Behavior: Behavior normal.        Judgment: Judgment normal.     LABORATORY DATA:  I have reviewed the data as listed Lab Results  Component Value Date   WBC 7.7 05/20/2021   HGB 13.8 05/20/2021   HCT 39.8 05/20/2021   MCV 89.6 05/20/2021   PLT 235 05/20/2021   Recent Labs    05/19/21 0755 05/20/21 1443  NA  --  137  K  --  3.4*  CL  --  100  CO2  --  30  GLUCOSE  --  228*  BUN  --  7*  CREATININE 1.10 1.11  CALCIUM  --  9.0  GFRNONAA  --  >60  PROT  --  7.5  ALBUMIN  --  4.1  AST  --  18  ALT  --  10  ALKPHOS  --  112  BILITOT  --  0.4    RADIOGRAPHIC STUDIES: I have personally reviewed the radiological images as listed and agreed with the findings in the report. CT CHEST W  CONTRAST  Result Date: 05/19/2021 CLINICAL DATA:  Abnormal chest x-ray.  Cough. EXAM: CT CHEST WITH CONTRAST  TECHNIQUE: Multidetector CT imaging of the chest was performed during intravenous contrast administration. CONTRAST:  75mL OMNIPAQUE IOHEXOL 350 MG/ML SOLN COMPARISON:  None available currently. FINDINGS: Cardiovascular: Atherosclerosis of thoracic aorta is noted without aneurysm or dissection. Normal cardiac size. No pericardial effusion. Mild coronary artery calcifications are noted. Mediastinum/Nodes: Thyroid gland and esophagus are unremarkable. 8 mm precarinal lymph node is noted. 8 mm subcarinal lymph node is noted. 12 mm right hilar lymph node is noted. Lungs/Pleura: No pneumothorax or pleural effusion is noted. Mild left apical scarring is noted. 8.2 x 5.9 cm spiculated mass is noted in right upper lobe which extends from the right hilum to the lateral chest wall and results in lytic destruction of the lateral portion of the right fourth rib. Upper Abdomen: 3 cm right adrenal mass is noted concerning for metastatic disease. Musculoskeletal: As noted previously, there is lytic destruction of the lateral portion of the right fourth rib secondary to right upper lobe malignancy. IMPRESSION: 8.2 x 5.9 cm spiculated mass noted in right upper lobe consistent with malignancy. It extends from the right hilum to the lateral chest wall and results in lytic destruction of the lateral portion of the right fourth rib. These results will be called to the ordering clinician or representative by the Radiologist Assistant, and communication documented in the PACS or zVision Dashboard. Mediastinal lymph nodes are noted, including 12 mm right hilar lymph node, consistent with metastatic disease. 3 cm right adrenal mass is noted concerning for metastatic disease. Mild coronary artery calcifications are noted. Aortic Atherosclerosis (ICD10-I70.0) and Emphysema (ICD10-J43.9). Electronically Signed   By: Marijo Conception M.D.   On: 05/19/2021 09:04   NM PET Image Initial (PI) Skull Base To Thigh (F-18 FDG)  Result Date:  05/26/2021 CLINICAL DATA:  Initial treatment strategy for right upper lobe mass EXAM: NUCLEAR MEDICINE PET SKULL BASE TO THIGH TECHNIQUE: 11.25 mCi F-18 FDG was injected intravenously. Full-ring PET imaging was performed from the skull base to thigh after the radiotracer. CT data was obtained and used for attenuation correction and anatomic localization. Fasting blood glucose: 144 mg/dl COMPARISON:  None. FINDINGS: Mediastinal blood pool activity: SUV max 2.2 Liver activity: SUV max 4.6 NECK: No hypermetabolic lymph nodes in the neck. Incidental CT findings: none CHEST: Peripherally hypermetabolic right upper lobe mass, likely due to central necrosis, with an SUV max of 8.7. Mass extends into the right hilum causing narrowing of the right upper lobe bronchus. Mildly enlarged hypermetabolic right hilar lymph node measuring 1.2 cm in short axis with an SUV max of 5.9. Mediastinal lymph nodes with FDG activity similar to mediastinal blood pool. Reference right lower paratracheal lymph node measuring 9 mm in short axis with an SUV max of 2.4. Incidental CT findings: none ABDOMEN/PELVIS: No abnormal hypermetabolic activity within the liver, pancreas, adrenal glands, or spleen. No hypermetabolic lymph nodes in the abdomen or pelvis. Incidental CT findings: Prominent left extrarenal pelvis. Nonobstructing stones of the left kidney. Left lower quadrant colostomy. SKELETON: Osseous destruction of the lateral second through fourth ribs adjacent to the right upper lobe lung mass. Incidental CT findings: none IMPRESSION: Peripherally hypermetabolic right upper lobe mass, likely due to central necrosis. Mass extends into the right hilum and right lateral chest wall involving the second through fourth right ribs. Mildly enlarged and hypermetabolic right hilar lymph node. No evidence of metastatic disease in the abdomen or pelvis.  Electronically Signed   By: Yetta Glassman M.D.   On: 05/26/2021 09:30   CT BIOPSY  Result  Date: 05/29/2021 INDICATION: Right lung mass EXAM: CT BIOPSY MEDICATIONS: None. ANESTHESIA/SEDATION: Moderate (conscious) sedation was employed during this procedure. A total of Versed 0.5 mg and Fentanyl 25 mcg was administered intravenously. Moderate Sedation Time: 14 minutes. The patient's level of consciousness and vital signs were monitored continuously by radiology nursing throughout the procedure under my direct supervision. FLUOROSCOPY TIME:  N/a COMPLICATIONS: None immediate. PROCEDURE: Informed written consent was obtained from the patient after a thorough discussion of the procedural risks, benefits and alternatives. All questions were addressed. Maximal Sterile Barrier Technique was utilized including caps, mask, sterile gowns, sterile gloves, sterile drape, hand hygiene and skin antiseptic. A timeout was performed prior to the initiation of the procedure. The patient was placed supine on the CT exam table. Limited CT of the chest was performed for planning purposes. The significant inserted a large right lung mass. Skin entry site was marked, and the overlying skin was prepped and draped in the standard sterile fashion. Local analgesia was obtained with 1% lidocaine. Using CT fluoroscopy, a 17 gauge introducer needle was advanced just deep to the right lung mass. Subsequently, core needle biopsy was performed using an 18 gauge core biopsy device x5 passes. Specimens were submitted in formalin to pathology for further handling. Limited postprocedure imaging demonstrated no complicating feature or pneumothorax. A clean dressing was placed. The patient tolerated the procedure well without immediate complication. IMPRESSION: Successful CT-guided core needle biopsy of right lung mass. Electronically Signed   By: Albin Felling M.D.   On: 05/29/2021 11:41   DG Chest Port 1 View  Result Date: 05/29/2021 CLINICAL DATA:  Status post right lung biopsy EXAM: PORTABLE CHEST 1 VIEW COMPARISON:  None. FINDINGS:  The cardiomediastinal silhouette is within normal limits. No pleural effusion. No pneumothorax. Again seen is the large right upper lung mass. Coarse interstitial markings. No acute osseous abnormality. IMPRESSION: No evidence of pneumothorax after right lung mass biopsy. Electronically Signed   By: Albin Felling M.D.   On: 05/29/2021 11:38    ASSESSMENT & PLAN:   Primary cancer of right upper lobe of lung ([place]) #Right upper lobe lung mass -concerning for malignancy.  8.2 x 5.9 cm -extends from the right hilum to the lateral chest wall and results in lytic destruction of the lateral portion of the right fourth rib; also noted to have mediastinal adenopathy/hilar lymph nodes; and a 3 cm adrenal mass on the right.  Recommend MRI of the brain.  August 2022-PET scan avoid lung findings; adrenal lesion non-avid.   #Biopsy unfortunately not definitive-however atypical cells suggestive of malignancy noted-no necrosis.  Discussed regarding repeating a biopsy however patient will need further invasive testing like bronchoscopy.  Given the clinical picture [imaging-]; I think is reasonable to proceed with concurrent chemoradiation without any repeat biopsy.   # Long discussion with the patient regarding the goal of treatment of stage III lung cancer-goal is cure; however only 10-20% of the patients are cured.  Patient has unresectable disease.  #Discussed the role of concurrent chemoradiation [weekly carbotaxol with the daily radiation/Monday through Friday ~6 weeks].  Reviewed with the patient the potential side effects of radiation include skin rash; radiation esophagitis/pneumonitis.   # Post chemoradiation-consolidation immunotherapy with durvalumab every 2 weeks is recommended based on data from [facility] clinical trial.  #Right chest wall pain-clinically stable on Tylenol/tramadol.  # COPD-prescribed albuterol.  #Smoking: Counseled  regarding quitting smoking.  #Prediabetes: We need to monitor blood  sugar levels on therapies.  #IV access-recommend port implantation.  # DISPOSITION: # Chemo education # port referral # follow up on SEP 12th, MD- labs- cbc/cmp;carbo-taxol-Dr.B   # I reviewed the blood work- with the patient in detail; also reviewed the imaging independently [as summarized above]; and with the patient in detail.    # 40 minutes face-to-face with the patient discussing the above plan of care; more than 50% of time spent on prognosis/ natural history; counseling and coordination.      All questions were answered. The patient knows to call the clinic with any problems, questions or concerns.       [name], MD 06/09/2021 10:51 PM

## 2021-06-03 NOTE — Consult Note (Signed)
NEW PATIENT EVALUATION  Name: Rodney Vega  MRN: [id number]  Date:   06/03/2021     DOB: [birth year]   This 65 y.o. 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  REFERRING PHYSICIAN: [name], MD  CHIEF COMPLAINT:  Chief Complaint  Patient presents with   Lung Cancer    DIAGNOSIS: The encounter diagnosis was Primary cancer of right upper lobe of lung ([place]).   PREVIOUS INVESTIGATIONS:  PET scan CT scans reviewed Clinical notes reviewed Pathology report reviewed  HPI: Patient is a 65 year old male who presented with right chest pain a feeling of cough had a chest x-ray showing a right lung mass.  CT scan demonstrated an 8.2 x 5.9 cm spiculated mass in the right upper lobe consistent with malignancy.  There is also lytic destruction of the lateral portion of the right fourth rib.  He had mediastinal lymph nodes.  Patient underwent a PET CT scan again showing peripheral hypermetabolic right upper lobe mass with central necrosis.  Mass extended to the right hilum and right lateral chest wall involving the second through fourth ribs.  There are mildly enlarged hypermetabolic right hilar lymph nodes no Adibe evidence of metastatic disease in abdomen or pelvis.  He underwent a CT-guided biopsy although not definitive for malignancy it was highly suspicious favoring squamous cell carcinoma.  Patient is now seen by both medical oncology and radiation oncology for opinion he is doing fairly well still has some tenderness and pain in his right chest wall.  He is having no significant cough hemoptysis or chest tightness.  PLANNED TREATMENT REGIMEN: IMRT radiation with concurrent chemotherapy  PAST MEDICAL HISTORY:  has a past medical history of Cancer ([place]), Degenerative arthritis of knee, Depression, Diverticulitis, Glaucoma, Hernia (1979), History of kidney stones, Hypertension, Personal  history of tobacco use, presenting hazards to health, Pre-diabetes, Screening for obesity, Special screening for malignant neoplasms, colon, and Wears dentures.    PAST SURGICAL HISTORY:  Past Surgical History:  Procedure Laterality Date   BACK SURGERY  1994, 2012   lumbar bulging disc   CATARACT EXTRACTION W/PHACO Left 01/20/2021   Procedure: CATARACT EXTRACTION PHACO AND INTRAOCULAR LENS PLACEMENT ([place]) LEFT;  Surgeon: Birder Robson, MD;  Location: [place];  Service: Ophthalmology;  Laterality: Left;  10.13 0:52.1   COLONOSCOPY  1962,2297   [facility]/Dr. Buelah Manis sessile polyp,47mm, found in rectum,multiple diverticula were found in the sigmoid, in descending and in transverse colon   COLOSTOMY  04-20-13   [place]   inguinal   JOINT REPLACEMENT Right    knee   KNEE ARTHROPLASTY Right 05/08/2018   Procedure: COMPUTER ASSISTED TOTAL KNEE ARTHROPLASTY;  Surgeon: Dereck Leep, MD;  Location: [facility];  Service: Orthopedics;  Laterality: Right;   KNEE ARTHROPLASTY Left 11/16/2019   Procedure: COMPUTER ASSISTED TOTAL KNEE ARTHROPLASTY;  Surgeon: Dereck Leep, MD;  Location: [facility];  Service: Orthopedics;  Laterality: Left;   LAPAROTOMY  04-20-13   colon resection with colosotmy   LITHOTRIPSY  2013   TONSILLECTOMY AND ADENOIDECTOMY  1962    FAMILY HISTORY: family history includes Diabetes in his mother; Heart attack in his father; Heart disease in his mother; Prostate cancer in his father.  SOCIAL HISTORY:  reports that he has been smoking cigarettes. He has a 20.00 pack-year smoking history. He has never used smokeless tobacco. He reports current alcohol use. He reports  current drug use. Drug: Marijuana.  ALLERGIES: Aleve [naproxen sodium]  MEDICATIONS:  Current Outpatient Medications  Medication Sig Dispense Refill   acetaminophen (TYLENOL) 500 MG tablet Take 1,000 mg by mouth daily.     cetirizine (ZYRTEC) 10 MG tablet Take 10 mg by mouth daily.      diphenhydramine-acetaminophen (TYLENOL PM) 25-500 MG TABS Take 1-2 tablets by mouth at bedtime as needed (sleep.).      dorzolamide-timolol (COSOPT) 22.3-6.8 MG/ML ophthalmic solution Place 1 drop into both eyes 2 (two) times daily.     guaiFENesin (MUCINEX) 600 MG 12 hr tablet Take by mouth 2 (two) times daily as needed.     latanoprost (XALATAN) 0.005 % ophthalmic solution Place 1 drop into both eyes at bedtime.     nicotine (NICODERM CQ - DOSED IN MG/24 HOURS) 21 mg/24hr patch Place 1 patch (21 mg total) onto the skin daily. 28 patch 1   olmesartan (BENICAR) 40 MG tablet Take 40 mg by mouth daily.     traMADol (ULTRAM) 50 MG tablet Take 1 tablet (50 mg total) by mouth every 8 (eight) hours as needed. 30 tablet 0   traZODone (DESYREL) 50 MG tablet Take 1 tablet (50 mg total) by mouth at bedtime as needed for sleep. 30 tablet 1   venlafaxine XR (EFFEXOR-XR) 75 MG 24 hr capsule Take 225 mg by mouth daily.     Cyanocobalamin (VITAMIN B-12 PO) Take by mouth daily. (Patient not taking: No sig reported)     No current facility-administered medications for this encounter.    ECOG PERFORMANCE STATUS:  1 - Symptomatic but completely ambulatory  REVIEW OF SYSTEMS: Patient denies any weight loss, fatigue, weakness, fever, chills or night sweats. Patient denies any loss of vision, blurred vision. Patient denies any ringing  of the ears or hearing loss. No irregular heartbeat. Patient denies heart murmur or history of fainting. Patient denies any chest pain or pain radiating to her upper extremities. Patient denies any shortness of breath, difficulty breathing at night, cough or hemoptysis. Patient denies any swelling in the lower legs. Patient denies any nausea vomiting, vomiting of blood, or coffee ground material in the vomitus. Patient denies any stomach pain. Patient states has had normal bowel movements no significant constipation or diarrhea. Patient denies any dysuria, hematuria or significant  nocturia. Patient denies any problems walking, swelling in the joints or loss of balance. Patient denies any skin changes, loss of hair or loss of weight. Patient denies any excessive worrying or anxiety or significant depression. Patient denies any problems with insomnia. Patient denies excessive thirst, polyuria, polydipsia. Patient denies any swollen glands, patient denies easy bruising or easy bleeding. Patient denies any recent infections, allergies or URI. Patient "s visual fields have not changed significantly in recent time.   PHYSICAL EXAM: BP (!) 162/91   Pulse 86   Temp 97.6 F (36.4 C) (Tympanic)   Resp 16   Wt 202 lb 4.8 oz (91.8 kg)   BMI 28.22 kg/m  Well-developed well-nourished patient in NAD. HEENT reveals PERLA, EOMI, discs not visualized.  Oral cavity is clear. No oral mucosal lesions are identified. Neck is clear without evidence of cervical or supraclavicular adenopathy. Lungs are clear to A&P. Cardiac examination is essentially unremarkable with regular rate and rhythm without murmur rub or thrill. Abdomen is benign with no organomegaly or masses noted. Motor sensory and DTR levels are equal and symmetric in the upper and lower extremities. Cranial nerves II through XII are grossly intact. Proprioception  is intact. No peripheral adenopathy or edema is identified. No motor or sensory levels are noted. Crude visual fields are within normal range.  LABORATORY DATA: Pathology report reviewed    RADIOLOGY RESULTS: CT scans PET CT scan reviewed compatible with above-stated findings   IMPRESSION: Stage IIIa non-small cell lung cancer of the right lung in 65 year old male for concurrent chemoradiation  PLAN: At this time I believe pathology report is sufficient with highly suspicious cells for malignancy favoring squamous cell carcinoma.  That and the fact there is lytic destruction of his ribs goes along with malignancy.  I would recommend [address] using IMRT treatment planning and  delivery.  I would treat with IMRT to avoid critical structures such as normal lung volume spinal cord esophagus and heart.  Risks and benefits of treatment including development of cough skin reaction fatigue alteration of blood counts and possible dysphagia all were reviewed in detail with the patient.  I have set her up for CT simulation later this week.  I will use motion restriction as well as 4-dimensional treatment planning.  PET fusion also will be performed.  Patient comprehends my recommendations well.  I would like to take this opportunity to thank you for allowing me to participate in the care of your patient.Noreene Filbert, MD

## 2021-06-04 ENCOUNTER — Other Ambulatory Visit: Payer: Self-pay | Admitting: Internal Medicine

## 2021-06-04 ENCOUNTER — Other Ambulatory Visit: Payer: Self-pay | Admitting: Radiology

## 2021-06-04 DIAGNOSIS — E119 Type 2 diabetes mellitus without complications: Secondary | ICD-10-CM | POA: Insufficient documentation

## 2021-06-04 DIAGNOSIS — I10 Essential (primary) hypertension: Secondary | ICD-10-CM | POA: Insufficient documentation

## 2021-06-05 ENCOUNTER — Ambulatory Visit
Admission: RE | Admit: 2021-06-05 | Discharge: 2021-06-05 | Disposition: A | Discharge status: Home / Self Care | Payer: Medicare PPO | Source: Ambulatory Visit | Attending: Radiation Oncology | Admitting: Radiation Oncology

## 2021-06-05 DIAGNOSIS — Z510 Encounter for antineoplastic radiation therapy: Secondary | ICD-10-CM | POA: Diagnosis present

## 2021-06-05 DIAGNOSIS — C3411 Malignant neoplasm of upper lobe, right bronchus or lung: Secondary | ICD-10-CM | POA: Diagnosis not present

## 2021-06-06 ENCOUNTER — Encounter: Payer: Self-pay | Admitting: Internal Medicine

## 2021-06-08 DIAGNOSIS — C3491 Malignant neoplasm of unspecified part of right bronchus or lung: Secondary | ICD-10-CM | POA: Insufficient documentation

## 2021-06-09 ENCOUNTER — Other Ambulatory Visit: Payer: Self-pay | Admitting: Internal Medicine

## 2021-06-09 ENCOUNTER — Other Ambulatory Visit: Payer: Self-pay | Admitting: Radiology

## 2021-06-09 ENCOUNTER — Encounter: Payer: Self-pay | Admitting: Internal Medicine

## 2021-06-09 DIAGNOSIS — Z510 Encounter for antineoplastic radiation therapy: Secondary | ICD-10-CM | POA: Diagnosis not present

## 2021-06-09 NOTE — Progress Notes (Signed)
START ON PATHWAY REGIMEN - Non-Small Cell Lung     Administer weekly:     Paclitaxel      Carboplatin   **Always confirm dose/schedule in your pharmacy ordering system**  Patient Characteristics: Preoperative or Nonsurgical Candidate (Clinical Staging), Stage III - Nonsurgical Candidate (Nonsquamous and Squamous), PS = 0, 1 Therapeutic Status: Preoperative or Nonsurgical Candidate (Clinical Staging) AJCC T Category: cT3 AJCC N Category: cN2 AJCC M Category: cM0 AJCC 8 Stage Grouping: IIIB ECOG Performance Status: 1 Intent of Therapy: Curative Intent, Discussed with Patient 

## 2021-06-10 ENCOUNTER — Other Ambulatory Visit: Payer: Self-pay | Admitting: Radiology

## 2021-06-10 ENCOUNTER — Other Ambulatory Visit: Payer: Self-pay

## 2021-06-10 ENCOUNTER — Encounter: Payer: Self-pay | Admitting: Radiology

## 2021-06-10 ENCOUNTER — Ambulatory Visit
Admission: RE | Admit: 2021-06-10 | Discharge: 2021-06-10 | Disposition: A | Discharge status: Home / Self Care | Payer: Medicare PPO | Source: Ambulatory Visit | Attending: Internal Medicine | Admitting: Internal Medicine

## 2021-06-10 DIAGNOSIS — Z79899 Other long term (current) drug therapy: Secondary | ICD-10-CM | POA: Diagnosis not present

## 2021-06-10 DIAGNOSIS — C3411 Malignant neoplasm of upper lobe, right bronchus or lung: Secondary | ICD-10-CM | POA: Diagnosis present

## 2021-06-10 DIAGNOSIS — Z886 Allergy status to analgesic agent status: Secondary | ICD-10-CM | POA: Diagnosis not present

## 2021-06-10 DIAGNOSIS — F17210 Nicotine dependence, cigarettes, uncomplicated: Secondary | ICD-10-CM | POA: Diagnosis not present

## 2021-06-10 HISTORY — PX: IR IMAGING GUIDED PORT INSERTION: IMG5740

## 2021-06-10 MED ORDER — LIDOCAINE HCL 1 % IJ SOLN
INTRAMUSCULAR | Status: AC
  Administered 2021-06-10: 20 mL
  Filled 2021-06-10: qty 20

## 2021-06-10 MED ORDER — FENTANYL CITRATE (PF) 100 MCG/2ML IJ SOLN
INTRAMUSCULAR | Status: AC | PRN
  Administered 2021-06-10 (×2): 50 ug via INTRAVENOUS

## 2021-06-10 MED ORDER — SODIUM CHLORIDE 0.9 % IV SOLN
INTRAVENOUS | Status: DC
  Filled 2021-06-10: qty 1000

## 2021-06-10 MED ORDER — HEPARIN SOD (PORK) LOCK FLUSH 100 UNIT/ML IV SOLN
INTRAVENOUS | Status: AC
  Administered 2021-06-10: 500 [IU]
  Filled 2021-06-10: qty 5

## 2021-06-10 MED ORDER — MIDAZOLAM HCL 2 MG/2ML IJ SOLN
INTRAMUSCULAR | Status: AC | PRN
  Administered 2021-06-10: 2 mg via INTRAVENOUS
  Administered 2021-06-10: 1 mg via INTRAVENOUS

## 2021-06-10 MED ORDER — MIDAZOLAM HCL 2 MG/2ML IJ SOLN
INTRAMUSCULAR | Status: AC
  Filled 2021-06-10: qty 2

## 2021-06-10 MED ORDER — FENTANYL CITRATE (PF) 100 MCG/2ML IJ SOLN
INTRAMUSCULAR | Status: AC
  Filled 2021-06-10: qty 2

## 2021-06-10 NOTE — Procedures (Signed)
Interventional Radiology Procedure Note  Procedure: Single Lumen Power Port Placement    Access:  Left IJ vein.  Findings: Catheter tip positioned at SVC/RA junction. Port is ready for immediate use.   Complications: None  EBL: < 10 mL  Recommendations:  - Ok to shower in 24 hours - Do not submerge for 7 days - Routine line care   Tomie Elko T. Kathlene Cote, M.D Pager:  [phone]

## 2021-06-10 NOTE — H&P (Signed)
Chief Complaint: Patient was seen in consultation today for lung cancer at the request of Brahmanday,Govinda R  Referring Physician(s): [name]  Supervising Physician: Aletta Edouard  Patient Status: [facility] - Out-pt  History of Present Illness: Rodney Vega is a 65 y.o. male with a RUL lung mass s/p biopsy with IR, pathology suggestive of malignancy. The patient has been seen by Oncology on 8/31 with decision to start chemoradiation, request received for port a catheter placement with IR. The patient has had a H&P performed within the last 30 days, all history, medications, and exam have been reviewed. The patient denies any interval changes since the H&P.  He denies any chest pain, shortness of breath or palpitations. He denies any active signs of bleeding or excessive bruising. He denies any recent fever or chills. The patient denies any history of sleep apnea or chronic oxygen use. He has previously tolerated sedation without complications.    Past Medical History:  Diagnosis Date   Cancer ([place])    Degenerative arthritis of knee    Depression    Diverticulitis    Glaucoma    since 2004   Hernia 1979   History of kidney stones    Hypertension    Personal history of tobacco use, presenting hazards to health    Pre-diabetes    Screening for obesity    Special screening for malignant neoplasms, colon    Wears dentures    full upper and lower    Past Surgical History:  Procedure Laterality Date   BACK SURGERY  1994, 2012   lumbar bulging disc   CATARACT EXTRACTION W/PHACO Left 01/20/2021   Procedure: CATARACT EXTRACTION PHACO AND INTRAOCULAR LENS PLACEMENT ([place]) LEFT;  Surgeon: Birder Robson, MD;  Location: [place];  Service: Ophthalmology;  Laterality: Left;  10.13 0:52.1   COLONOSCOPY  [id number]   [facility]/Dr. Buelah Manis sessile polyp,59mm, found in rectum,multiple diverticula were found in the sigmoid, in descending and in transverse colon    COLOSTOMY  04-20-13   [place]   inguinal   JOINT REPLACEMENT Right    knee   KNEE ARTHROPLASTY Right 05/08/2018   Procedure: COMPUTER ASSISTED TOTAL KNEE ARTHROPLASTY;  Surgeon: Dereck Leep, MD;  Location: [facility];  Service: Orthopedics;  Laterality: Right;   KNEE ARTHROPLASTY Left 11/16/2019   Procedure: COMPUTER ASSISTED TOTAL KNEE ARTHROPLASTY;  Surgeon: Dereck Leep, MD;  Location: [facility];  Service: Orthopedics;  Laterality: Left;   LAPAROTOMY  04-20-13   colon resection with colosotmy   LITHOTRIPSY  2013   TONSILLECTOMY AND ADENOIDECTOMY  1962    Allergies: Aleve [naproxen sodium]  Medications: Prior to Admission medications   Medication Sig Start Date End Date Taking? Authorizing Provider  acetaminophen (TYLENOL) 500 MG tablet Take 1,000 mg by mouth daily.   Yes [provider]  cetirizine (ZYRTEC) 10 MG tablet Take 10 mg by mouth daily.   Yes [provider]  diphenhydramine-acetaminophen (TYLENOL PM) 25-500 MG TABS Take 1-2 tablets by mouth at bedtime as needed (sleep.).    Yes [provider]  dorzolamide-timolol (COSOPT) 22.3-6.8 MG/ML ophthalmic solution Place 1 drop into both eyes 2 (two) times daily.   Yes [provider]  guaiFENesin (MUCINEX) 600 MG 12 hr tablet Take by mouth 2 (two) times daily as needed.   Yes [provider]  latanoprost (XALATAN) 0.005 % ophthalmic solution Place 1 drop into both eyes at bedtime.   Yes [provider]  nicotine (NICODERM CQ -  DOSED IN MG/24 HOURS) 21 mg/24hr patch Place 1 patch (21 mg total) onto the skin daily. 05/23/21  Yes [name], MD  traMADol (ULTRAM) 50 MG tablet TAKE 1 TABLET(50 MG) BY MOUTH EVERY 8 HOURS AS NEEDED 06/04/21  Yes [name], MD  traZODone (DESYREL) 50 MG tablet Take 1 tablet (50 mg total) by mouth at bedtime as needed for sleep. 05/22/21  Yes [name], MD  venlafaxine XR (EFFEXOR-XR) 75 MG 24 hr capsule Take  225 mg by mouth daily. 09/24/19  Yes [provider]  albuterol (VENTOLIN HFA) 108 (90 Base) MCG/ACT inhaler Inhale 2 puffs into the lungs every 6 (six) hours as needed for wheezing or shortness of breath. 06/03/21   [name], MD  Cyanocobalamin (VITAMIN B-12 PO) Take by mouth daily. Patient not taking: No sig reported    [provider]  olmesartan (BENICAR) 40 MG tablet Take 40 mg by mouth daily.    [provider]     Family History  Problem Relation Age of Onset   Diabetes Mother    Heart disease Mother    Heart attack Father    Prostate cancer Father     Social History   Socioeconomic History   Marital status: Single    Spouse name: Not on file   Number of children: Not on file   Years of education: Not on file   Highest education level: Not on file  Occupational History   Not on file  Tobacco Use   Smoking status: Every Day    Packs/day: 1.00    Years: 20.00    Pack years: 20.00    Types: Cigarettes   Smokeless tobacco: Never   Tobacco comments:    since age 35 or 40  Vaping Use   Vaping Use: Never used  Substance and Sexual Activity   Alcohol use: Yes    Comment: occassional   Drug use: Yes    Types: Marijuana   Sexual activity: Not on file  Other Topics Concern   Not on file  Social History Narrative   15 mins [place]; smoking; not much alcohol. Worked for [employer], 2019. [employer]    Social Determinants of Health   Financial Resource Strain: Not on file  Food Insecurity: Not on file  Transportation Needs: Not on file  Physical Activity: Not on file  Stress: Not on file  Social Connections: Not on file   Review of Systems: A 12 point ROS discussed and pertinent positives are indicated in the HPI above.  All other systems are negative.  Review of Systems  Vital Signs: BP (!) 164/85   Pulse 77   Temp 98.1 F (36.7 C)   Resp (!) 8   Ht 5\' 11"  (1.803 m)   Wt 203 lb (92.1 kg)   SpO2 98%   BMI  28.31 kg/m   Physical Exam Constitutional:      Appearance: Normal appearance.  HENT:     Head: Normocephalic and atraumatic.  Cardiovascular:     Rate and Rhythm: Normal rate and regular rhythm.  Pulmonary:     Effort: Pulmonary effort is normal. No respiratory distress.  Abdominal:     Palpations: Abdomen is soft.  Skin:    General: Skin is warm and dry.  Neurological:     Mental Status: He is alert and oriented to person, place, and time.  Psychiatric:        Thought Content: Thought content normal.  Imaging: CT CHEST W CONTRAST  Result Date: 05/19/2021 CLINICAL DATA:  Abnormal chest x-ray.  Cough. EXAM: CT CHEST WITH CONTRAST TECHNIQUE: Multidetector CT imaging of the chest was performed during intravenous contrast administration. CONTRAST:  72mL OMNIPAQUE IOHEXOL 350 MG/ML SOLN COMPARISON:  None available currently. FINDINGS: Cardiovascular: Atherosclerosis of thoracic aorta is noted without aneurysm or dissection. Normal cardiac size. No pericardial effusion. Mild coronary artery calcifications are noted. Mediastinum/Nodes: Thyroid gland and esophagus are unremarkable. 8 mm precarinal lymph node is noted. 8 mm subcarinal lymph node is noted. 12 mm right hilar lymph node is noted. Lungs/Pleura: No pneumothorax or pleural effusion is noted. Mild left apical scarring is noted. 8.2 x 5.9 cm spiculated mass is noted in right upper lobe which extends from the right hilum to the lateral chest wall and results in lytic destruction of the lateral portion of the right fourth rib. Upper Abdomen: 3 cm right adrenal mass is noted concerning for metastatic disease. Musculoskeletal: As noted previously, there is lytic destruction of the lateral portion of the right fourth rib secondary to right upper lobe malignancy. IMPRESSION: 8.2 x 5.9 cm spiculated mass noted in right upper lobe consistent with malignancy. It extends from the right hilum to the lateral chest wall and results in lytic  destruction of the lateral portion of the right fourth rib. These results will be called to the ordering clinician or representative by the Radiologist Assistant, and communication documented in the PACS or zVision Dashboard. Mediastinal lymph nodes are noted, including 12 mm right hilar lymph node, consistent with metastatic disease. 3 cm right adrenal mass is noted concerning for metastatic disease. Mild coronary artery calcifications are noted. Aortic Atherosclerosis (ICD10-I70.0) and Emphysema (ICD10-J43.9). Electronically Signed   By: Marijo Conception M.D.   On: 05/19/2021 09:04   NM PET Image Initial (PI) Skull Base To Thigh (F-18 FDG)  Result Date: 05/26/2021 CLINICAL DATA:  Initial treatment strategy for right upper lobe mass EXAM: NUCLEAR MEDICINE PET SKULL BASE TO THIGH TECHNIQUE: 11.25 mCi F-18 FDG was injected intravenously. Full-ring PET imaging was performed from the skull base to thigh after the radiotracer. CT data was obtained and used for attenuation correction and anatomic localization. Fasting blood glucose: 144 mg/dl COMPARISON:  None. FINDINGS: Mediastinal blood pool activity: SUV max 2.2 Liver activity: SUV max 4.6 NECK: No hypermetabolic lymph nodes in the neck. Incidental CT findings: none CHEST: Peripherally hypermetabolic right upper lobe mass, likely due to central necrosis, with an SUV max of 8.7. Mass extends into the right hilum causing narrowing of the right upper lobe bronchus. Mildly enlarged hypermetabolic right hilar lymph node measuring 1.2 cm in short axis with an SUV max of 5.9. Mediastinal lymph nodes with FDG activity similar to mediastinal blood pool. Reference right lower paratracheal lymph node measuring 9 mm in short axis with an SUV max of 2.4. Incidental CT findings: none ABDOMEN/PELVIS: No abnormal hypermetabolic activity within the liver, pancreas, adrenal glands, or spleen. No hypermetabolic lymph nodes in the abdomen or pelvis. Incidental CT findings: Prominent  left extrarenal pelvis. Nonobstructing stones of the left kidney. Left lower quadrant colostomy. SKELETON: Osseous destruction of the lateral second through fourth ribs adjacent to the right upper lobe lung mass. Incidental CT findings: none IMPRESSION: Peripherally hypermetabolic right upper lobe mass, likely due to central necrosis. Mass extends into the right hilum and right lateral chest wall involving the second through fourth right ribs. Mildly enlarged and hypermetabolic right hilar lymph node. No evidence of metastatic disease in  the abdomen or pelvis. Electronically Signed   By: Yetta Glassman M.D.   On: 05/26/2021 09:30   CT BIOPSY  Result Date: 05/29/2021 INDICATION: Right lung mass EXAM: CT BIOPSY MEDICATIONS: None. ANESTHESIA/SEDATION: Moderate (conscious) sedation was employed during this procedure. A total of Versed 0.5 mg and Fentanyl 25 mcg was administered intravenously. Moderate Sedation Time: 14 minutes. The patient's level of consciousness and vital signs were monitored continuously by radiology nursing throughout the procedure under my direct supervision. FLUOROSCOPY TIME:  N/a COMPLICATIONS: None immediate. PROCEDURE: Informed written consent was obtained from the patient after a thorough discussion of the procedural risks, benefits and alternatives. All questions were addressed. Maximal Sterile Barrier Technique was utilized including caps, mask, sterile gowns, sterile gloves, sterile drape, hand hygiene and skin antiseptic. A timeout was performed prior to the initiation of the procedure. The patient was placed supine on the CT exam table. Limited CT of the chest was performed for planning purposes. The significant inserted a large right lung mass. Skin entry site was marked, and the overlying skin was prepped and draped in the standard sterile fashion. Local analgesia was obtained with 1% lidocaine. Using CT fluoroscopy, a 17 gauge introducer needle was advanced just deep to the right  lung mass. Subsequently, core needle biopsy was performed using an 18 gauge core biopsy device x5 passes. Specimens were submitted in formalin to pathology for further handling. Limited postprocedure imaging demonstrated no complicating feature or pneumothorax. A clean dressing was placed. The patient tolerated the procedure well without immediate complication. IMPRESSION: Successful CT-guided core needle biopsy of right lung mass. Electronically Signed   By: Albin Felling M.D.   On: 05/29/2021 11:41   DG Chest Port 1 View  Result Date: 05/29/2021 CLINICAL DATA:  Status post right lung biopsy EXAM: PORTABLE CHEST 1 VIEW COMPARISON:  None. FINDINGS: The cardiomediastinal silhouette is within normal limits. No pleural effusion. No pneumothorax. Again seen is the large right upper lung mass. Coarse interstitial markings. No acute osseous abnormality. IMPRESSION: No evidence of pneumothorax after right lung mass biopsy. Electronically Signed   By: Albin Felling M.D.   On: 05/29/2021 11:38    Labs:  CBC: Recent Labs    05/20/21 1443  WBC 7.7  HGB 13.8  HCT 39.8  PLT 235    COAGS: Recent Labs    05/20/21 1443  INR 1.0  APTT 29    BMP: Recent Labs    05/19/21 0755 05/20/21 1443  NA  --  137  K  --  3.4*  CL  --  100  CO2  --  30  GLUCOSE  --  228*  BUN  --  7*  CALCIUM  --  9.0  CREATININE 1.10 1.11  GFRNONAA  --  >60    LIVER FUNCTION TESTS: Recent Labs    05/20/21 1443  BILITOT 0.4  AST 18  ALT 10  ALKPHOS 112  PROT 7.5  ALBUMIN 4.1    Assessment and Plan: RUL lung mass s/p biopsy with IR, pathology suggestive of malignancy  Seen by Oncology 8/31 with decision to start chemoradiation  Request received for port a catheter placement with IR The patient has been NPO, no blood thinners taken, labs and vitals have been reviewed.  Risks and benefits of image guided port-a-catheter placement was discussed with the patient including, but not limited to bleeding,  infection, pneumothorax, or fibrin sheath development and need for additional procedures.  All of the patient's questions were answered, patient is  agreeable to proceed. Consent signed and in chart.   Thank you for this interesting consult.  I greatly enjoyed meeting Hiroto Saltzman and look forward to participating in their care.  A copy of this report was sent to the requesting provider on this date.  Electronically Signed: Hedy Jacob, PA-C 06/10/2021, 11:39 AM   I spent a total of 15 Minutes in face to face in clinical consultation, greater than 50% of which was counseling/coordinating care for Lung cancer.

## 2021-06-11 ENCOUNTER — Ambulatory Visit: Admission: RE | Admit: 2021-06-11 | Payer: Medicare PPO | Source: Ambulatory Visit

## 2021-06-11 ENCOUNTER — Other Ambulatory Visit: Payer: Medicare PPO

## 2021-06-11 ENCOUNTER — Inpatient Hospital Stay: Payer: Medicare PPO | Attending: Internal Medicine

## 2021-06-11 DIAGNOSIS — J449 Chronic obstructive pulmonary disease, unspecified: Secondary | ICD-10-CM | POA: Insufficient documentation

## 2021-06-11 DIAGNOSIS — E279 Disorder of adrenal gland, unspecified: Secondary | ICD-10-CM | POA: Insufficient documentation

## 2021-06-11 DIAGNOSIS — T380X5A Adverse effect of glucocorticoids and synthetic analogues, initial encounter: Secondary | ICD-10-CM | POA: Insufficient documentation

## 2021-06-11 DIAGNOSIS — Z8042 Family history of malignant neoplasm of prostate: Secondary | ICD-10-CM | POA: Insufficient documentation

## 2021-06-11 DIAGNOSIS — C3411 Malignant neoplasm of upper lobe, right bronchus or lung: Secondary | ICD-10-CM | POA: Insufficient documentation

## 2021-06-11 DIAGNOSIS — R0789 Other chest pain: Secondary | ICD-10-CM | POA: Insufficient documentation

## 2021-06-11 DIAGNOSIS — Z79899 Other long term (current) drug therapy: Secondary | ICD-10-CM | POA: Insufficient documentation

## 2021-06-11 DIAGNOSIS — F17210 Nicotine dependence, cigarettes, uncomplicated: Secondary | ICD-10-CM | POA: Insufficient documentation

## 2021-06-11 DIAGNOSIS — Z5111 Encounter for antineoplastic chemotherapy: Secondary | ICD-10-CM | POA: Insufficient documentation

## 2021-06-11 DIAGNOSIS — J439 Emphysema, unspecified: Secondary | ICD-10-CM | POA: Insufficient documentation

## 2021-06-11 DIAGNOSIS — I10 Essential (primary) hypertension: Secondary | ICD-10-CM | POA: Insufficient documentation

## 2021-06-11 DIAGNOSIS — Z7984 Long term (current) use of oral hypoglycemic drugs: Secondary | ICD-10-CM | POA: Insufficient documentation

## 2021-06-11 DIAGNOSIS — E1165 Type 2 diabetes mellitus with hyperglycemia: Secondary | ICD-10-CM | POA: Insufficient documentation

## 2021-06-11 DIAGNOSIS — I700 Atherosclerosis of aorta: Secondary | ICD-10-CM | POA: Insufficient documentation

## 2021-06-11 MED ORDER — LIDOCAINE-PRILOCAINE 2.5-2.5 % EX CREA
TOPICAL_CREAM | CUTANEOUS | 3 refills | Status: DC
Start: 2021-06-11 — End: 2021-09-11

## 2021-06-11 MED ORDER — ONDANSETRON HCL 8 MG PO TABS: 8.0000 mg | ORAL_TABLET | Freq: Two times a day (BID) | ORAL | 1 refills | Status: DC | PRN

## 2021-06-11 MED ORDER — PROCHLORPERAZINE MALEATE 10 MG PO TABS: 10.0000 mg | ORAL_TABLET | Freq: Four times a day (QID) | ORAL | 1 refills | Status: DC | PRN

## 2021-06-11 MED ORDER — DEXAMETHASONE 4 MG PO TABS
8.0000 mg | ORAL_TABLET | Freq: Every day | ORAL | 1 refills | Status: DC
Start: 2021-06-11 — End: 2021-09-11

## 2021-06-11 NOTE — Progress Notes (Signed)
Tumor Board Documentation  Rodney Vega was presented by Dr Rogue Bussing at our Tumor Board on 06/11/2021, which included representatives from medical oncology, radiation oncology, surgical, radiology, pathology, navigation, internal medicine, genetics, research, palliative care, pulmonology, pharmacy.  Rodney Vega currently presents as a new patient, for [facility], for new positive pathology with history of the following treatments: surgical intervention(s).  Additionally, we reviewed previous medical and familial history, history of present illness, and recent lab results along with all available histopathologic and imaging studies. The tumor board considered available treatment options and made the following recommendations: Concurrent chemo-radiation therapy, Additional screening (MRI Brain,)    The following procedures/referrals were also placed: No orders of the defined types were placed in this encounter.   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  Tumor board is a meeting of clinicians from various specialty areas who evaluate and discuss patients for whom a multidisciplinary approach is being considered. Final determinations in the plan of care are those of the provider(s). The responsibility for follow up of recommendations given during tumor board is that of the provider.   Today's extended care, comprehensive team conference, Rodney Vega was not present for the discussion and was not examined.   Multidisciplinary Tumor Board is a multidisciplinary case peer review process.  Decisions discussed in the Multidisciplinary Tumor Board reflect the opinions of the specialists present at the conference without having examined the patient.  Ultimately, treatment and diagnostic decisions rest with the primary  provider(s) and the patient.

## 2021-06-11 NOTE — Addendum Note (Signed)
Addended by: Magdalene Patricia B on: 06/11/2021 04:21 PM   Modules accepted: Orders

## 2021-06-12 ENCOUNTER — Ambulatory Visit
Admission: RE | Admit: 2021-06-12 | Discharge: 2021-06-12 | Disposition: A | Discharge status: Home / Self Care | Payer: Medicare PPO | Source: Ambulatory Visit | Attending: Internal Medicine | Admitting: Internal Medicine

## 2021-06-12 DIAGNOSIS — C3411 Malignant neoplasm of upper lobe, right bronchus or lung: Secondary | ICD-10-CM | POA: Diagnosis not present

## 2021-06-12 MED ORDER — GADOBUTROL 1 MMOL/ML IV SOLN
9.0000 mL | Freq: Once | INTRAVENOUS | Status: AC | PRN
  Administered 2021-06-12: 9 mL via INTRAVENOUS

## 2021-06-15 ENCOUNTER — Ambulatory Visit
Admission: RE | Admit: 2021-06-15 | Discharge: 2021-06-15 | Disposition: A | Discharge status: Home / Self Care | Payer: Medicare PPO | Source: Ambulatory Visit | Attending: Radiation Oncology | Admitting: Radiation Oncology

## 2021-06-15 ENCOUNTER — Encounter: Payer: Self-pay | Admitting: Internal Medicine

## 2021-06-15 ENCOUNTER — Other Ambulatory Visit: Payer: Self-pay

## 2021-06-15 ENCOUNTER — Inpatient Hospital Stay: Payer: Medicare PPO

## 2021-06-15 ENCOUNTER — Inpatient Hospital Stay (HOSPITAL_BASED_OUTPATIENT_CLINIC_OR_DEPARTMENT_OTHER): Payer: Medicare PPO | Admitting: Internal Medicine

## 2021-06-15 VITALS — BP 136/78 | HR 71 | Resp 16

## 2021-06-15 DIAGNOSIS — Z7984 Long term (current) use of oral hypoglycemic drugs: Secondary | ICD-10-CM | POA: Diagnosis not present

## 2021-06-15 DIAGNOSIS — Z510 Encounter for antineoplastic radiation therapy: Secondary | ICD-10-CM | POA: Diagnosis not present

## 2021-06-15 DIAGNOSIS — I10 Essential (primary) hypertension: Secondary | ICD-10-CM | POA: Diagnosis not present

## 2021-06-15 DIAGNOSIS — Z79899 Other long term (current) drug therapy: Secondary | ICD-10-CM | POA: Diagnosis not present

## 2021-06-15 DIAGNOSIS — T380X5A Adverse effect of glucocorticoids and synthetic analogues, initial encounter: Secondary | ICD-10-CM | POA: Diagnosis not present

## 2021-06-15 DIAGNOSIS — E1165 Type 2 diabetes mellitus with hyperglycemia: Secondary | ICD-10-CM | POA: Diagnosis not present

## 2021-06-15 DIAGNOSIS — E279 Disorder of adrenal gland, unspecified: Secondary | ICD-10-CM | POA: Diagnosis not present

## 2021-06-15 DIAGNOSIS — C3411 Malignant neoplasm of upper lobe, right bronchus or lung: Secondary | ICD-10-CM | POA: Diagnosis present

## 2021-06-15 DIAGNOSIS — R0789 Other chest pain: Secondary | ICD-10-CM | POA: Diagnosis not present

## 2021-06-15 DIAGNOSIS — I700 Atherosclerosis of aorta: Secondary | ICD-10-CM | POA: Diagnosis not present

## 2021-06-15 DIAGNOSIS — C3490 Malignant neoplasm of unspecified part of unspecified bronchus or lung: Secondary | ICD-10-CM

## 2021-06-15 DIAGNOSIS — F17210 Nicotine dependence, cigarettes, uncomplicated: Secondary | ICD-10-CM | POA: Diagnosis not present

## 2021-06-15 DIAGNOSIS — Z5111 Encounter for antineoplastic chemotherapy: Secondary | ICD-10-CM | POA: Diagnosis not present

## 2021-06-15 DIAGNOSIS — J449 Chronic obstructive pulmonary disease, unspecified: Secondary | ICD-10-CM | POA: Diagnosis not present

## 2021-06-15 DIAGNOSIS — J439 Emphysema, unspecified: Secondary | ICD-10-CM | POA: Diagnosis not present

## 2021-06-15 DIAGNOSIS — Z8042 Family history of malignant neoplasm of prostate: Secondary | ICD-10-CM | POA: Diagnosis not present

## 2021-06-15 LAB — CBC WITH DIFFERENTIAL/PLATELET
Abs Immature Granulocytes: 0.05 10*3/uL (ref 0.00–0.07)
Basophils Absolute: 0.1 10*3/uL (ref 0.0–0.1)
Basophils Relative: 1 %
Eosinophils Absolute: 0.2 10*3/uL (ref 0.0–0.5)
Eosinophils Relative: 2 %
HCT: 38.9 % — ABNORMAL LOW (ref 39.0–52.0)
Hemoglobin: 13.3 g/dL (ref 13.0–17.0)
Immature Granulocytes: 1 %
Lymphocytes Relative: 17 %
Lymphs Abs: 1.8 10*3/uL (ref 0.7–4.0)
MCH: 30.7 pg (ref 26.0–34.0)
MCHC: 34.2 g/dL (ref 30.0–36.0)
MCV: 89.8 fL (ref 80.0–100.0)
Monocytes Absolute: 0.8 10*3/uL (ref 0.1–1.0)
Monocytes Relative: 8 %
Neutro Abs: 7.5 10*3/uL (ref 1.7–7.7)
Neutrophils Relative %: 71 %
Platelets: 249 10*3/uL (ref 150–400)
RBC: 4.33 MIL/uL (ref 4.22–5.81)
RDW: 12.9 % (ref 11.5–15.5)
WBC: 10.6 10*3/uL — ABNORMAL HIGH (ref 4.0–10.5)
nRBC: 0 % (ref 0.0–0.2)

## 2021-06-15 LAB — COMPREHENSIVE METABOLIC PANEL
ALT: 11 U/L (ref 0–44)
AST: 17 U/L (ref 15–41)
Albumin: 3.9 g/dL (ref 3.5–5.0)
Alkaline Phosphatase: 109 U/L (ref 38–126)
Anion gap: 7 (ref 5–15)
BUN: 11 mg/dL (ref 8–23)
CO2: 28 mmol/L (ref 22–32)
Calcium: 8.9 mg/dL (ref 8.9–10.3)
Chloride: 100 mmol/L (ref 98–111)
Creatinine, Ser: 1.06 mg/dL (ref 0.61–1.24)
GFR, Estimated: >60 mL/min (ref >60)
Glucose, Bld: 171 mg/dL — ABNORMAL HIGH (ref 70–99)
Potassium: 3.6 mmol/L (ref 3.5–5.1)
Sodium: 135 mmol/L (ref 135–145)
Total Bilirubin: 0.8 mg/dL (ref 0.3–1.2)
Total Protein: 7.3 g/dL (ref 6.5–8.1)

## 2021-06-15 MED ORDER — SODIUM CHLORIDE 0.9 % IV SOLN
Freq: Once | INTRAVENOUS | Status: AC
  Filled 2021-06-15: qty 250

## 2021-06-15 MED ORDER — DIPHENHYDRAMINE HCL 50 MG/ML IJ SOLN
50.0000 mg | Freq: Once | INTRAMUSCULAR | Status: AC
  Administered 2021-06-15: 50 mg via INTRAVENOUS
  Filled 2021-06-15: qty 1

## 2021-06-15 MED ORDER — SODIUM CHLORIDE 0.9 % IV SOLN
228.0000 mg | Freq: Once | INTRAVENOUS | Status: AC
  Administered 2021-06-15: 230 mg via INTRAVENOUS
  Filled 2021-06-15: qty 23

## 2021-06-15 MED ORDER — FAMOTIDINE 20 MG IN NS 100 ML IVPB
20.0000 mg | Freq: Once | INTRAVENOUS | Status: AC
  Administered 2021-06-15: 20 mg via INTRAVENOUS
  Filled 2021-06-15: qty 20

## 2021-06-15 MED ORDER — PALONOSETRON HCL INJECTION 0.25 MG/5ML
0.2500 mg | Freq: Once | INTRAVENOUS | Status: AC
  Administered 2021-06-15: 0.25 mg via INTRAVENOUS
  Filled 2021-06-15: qty 5

## 2021-06-15 MED ORDER — SODIUM CHLORIDE 0.9 % IV SOLN
10.0000 mg | Freq: Once | INTRAVENOUS | Status: AC
  Administered 2021-06-15: 10 mg via INTRAVENOUS
  Filled 2021-06-15: qty 10

## 2021-06-15 MED ORDER — HEPARIN SOD (PORK) LOCK FLUSH 100 UNIT/ML IV SOLN
500.0000 [IU] | Freq: Once | INTRAVENOUS | Status: AC | PRN
  Administered 2021-06-15: 500 [IU]
  Filled 2021-06-15: qty 5

## 2021-06-15 MED ORDER — SODIUM CHLORIDE 0.9 % IV SOLN
45.0000 mg/m2 | Freq: Once | INTRAVENOUS | Status: AC
  Administered 2021-06-15: 96 mg via INTRAVENOUS
  Filled 2021-06-15: qty 16

## 2021-06-15 NOTE — Patient Instructions (Signed)
[facility]  Discharge Instructions: Thank you for choosing [facility] to provide your oncology and hematology care.  If you have a lab appointment with the [facility], please go directly to the [facility] and check in at the registration area.  Wear comfortable clothing and clothing appropriate for easy access to any Portacath or PICC line.   We strive to give you quality time with your provider. You may need to reschedule your appointment if you arrive late (15 or more minutes).  Arriving late affects you and other patients whose appointments are after yours.  Also, if you miss three or more appointments without notifying the office, you may be dismissed from the clinic at the provider's discretion.      For prescription refill requests, have your pharmacy contact our office and allow 72 hours for refills to be completed.    Today you received the following chemotherapy and/or immunotherapy agents : Taxol / Carboplatin   Paclitaxel injection What is this medication? PACLITAXEL (PAK li TAX el) is a chemotherapy drug. It targets fast dividing cells, like cancer cells, and causes these cells to die. This medicine is used to treat ovarian cancer, breast cancer, lung cancer, Kaposi's sarcoma, and other cancers. This medicine may be used for other purposes; ask your health care provider or pharmacist if you have questions. COMMON BRAND NAME(S): Onxol, Taxol What should I tell my care team before I take this medication? They need to know if you have any of these conditions: history of irregular heartbeat liver disease low blood counts, like low white cell, platelet, or red cell counts lung or breathing disease, like asthma tingling of the fingers or toes, or other nerve disorder an unusual or allergic reaction to paclitaxel, alcohol, polyoxyethylated castor oil, other chemotherapy, other medicines, foods, dyes, or preservatives pregnant  or trying to get pregnant breast-feeding How should I use this medication? This drug is given as an infusion into a vein. It is administered in a hospital or clinic by a specially trained health care professional. Talk to your pediatrician regarding the use of this medicine in children. Special care may be needed. Overdosage: If you think you have taken too much of this medicine contact a poison control center or emergency room at once. NOTE: This medicine is only for you. Do not share this medicine with others. What if I miss a dose? It is important not to miss your dose. Call your doctor or health care professional if you are unable to keep an appointment. What may interact with this medication? Do not take this medicine with any of the following medications: live virus vaccines This medicine may also interact with the following medications: antiviral medicines for hepatitis, HIV or AIDS certain antibiotics like erythromycin and clarithromycin certain medicines for fungal infections like ketoconazole and itraconazole certain medicines for seizures like carbamazepine, phenobarbital, phenytoin gemfibrozil nefazodone rifampin St. John's wort This list may not describe all possible interactions. Give your health care provider a list of all the medicines, herbs, non-prescription drugs, or dietary supplements you use. Also tell them if you smoke, drink alcohol, or use illegal drugs. Some items may interact with your medicine. What should I watch for while using this medication? Your condition will be monitored carefully while you are receiving this medicine. You will need important blood work done while you are taking this medicine. This medicine can cause serious allergic reactions. To reduce your risk you will need to take other  medicine(s) before treatment with this medicine. If you experience allergic reactions like skin rash, itching or hives, swelling of the face, lips, or tongue, tell your  doctor or health care professional right away. In some cases, you may be given additional medicines to help with side effects. Follow all directions for their use. This drug may make you feel generally unwell. This is not uncommon, as chemotherapy can affect healthy cells as well as cancer cells. Report any side effects. Continue your course of treatment even though you feel ill unless your doctor tells you to stop. Call your doctor or health care professional for advice if you get a fever, chills or sore throat, or other symptoms of a cold or flu. Do not treat yourself. This drug decreases your body's ability to fight infections. Try to avoid being around people who are sick. This medicine may increase your risk to bruise or bleed. Call your doctor or health care professional if you notice any unusual bleeding. Be careful brushing and flossing your teeth or using a toothpick because you may get an infection or bleed more easily. If you have any dental work done, tell your dentist you are receiving this medicine. Avoid taking products that contain aspirin, acetaminophen, ibuprofen, naproxen, or ketoprofen unless instructed by your doctor. These medicines may hide a fever. Do not become pregnant while taking this medicine. Women should inform their doctor if they wish to become pregnant or think they might be pregnant. There is a potential for serious side effects to an unborn child. Talk to your health care professional or pharmacist for more information. Do not breast-feed an infant while taking this medicine. Men are advised not to father a child while receiving this medicine. This product may contain alcohol. Ask your pharmacist or healthcare provider if this medicine contains alcohol. Be sure to tell all healthcare providers you are taking this medicine. Certain medicines, like metronidazole and disulfiram, can cause an unpleasant reaction when taken with alcohol. The reaction includes flushing,  headache, nausea, vomiting, sweating, and increased thirst. The reaction can last from 30 minutes to several hours. What side effects may I notice from receiving this medication? Side effects that you should report to your doctor or health care professional as soon as possible: allergic reactions like skin rash, itching or hives, swelling of the face, lips, or tongue breathing problems changes in vision fast, irregular heartbeat high or low blood pressure mouth sores pain, tingling, numbness in the hands or feet signs of decreased platelets or bleeding - bruising, pinpoint red spots on the skin, black, tarry stools, blood in the urine signs of decreased red blood cells - unusually weak or tired, feeling faint or lightheaded, falls signs of infection - fever or chills, cough, sore throat, pain or difficulty passing urine signs and symptoms of liver injury like dark yellow or brown urine; general ill feeling or flu-like symptoms; light-colored stools; loss of appetite; nausea; right upper belly pain; unusually weak or tired; yellowing of the eyes or skin swelling of the ankles, feet, hands unusually slow heartbeat Side effects that usually do not require medical attention (report to your doctor or health care professional if they continue or are bothersome): diarrhea hair loss loss of appetite muscle or joint pain nausea, vomiting pain, redness, or irritation at site where injected tiredness This list may not describe all possible side effects. Call your doctor for medical advice about side effects. You may report side effects to FDA at 1-800-FDA-1088. Where should I keep  my medication? This drug is given in a hospital or clinic and will not be stored at home. NOTE: This sheet is a summary. It may not cover all possible information. If you have questions about this medicine, talk to your doctor, pharmacist, or health care provider.  2022 Elsevier/Gold Standard (2019-08-22  13:37:23)    Carboplatin injection What is this medication? CARBOPLATIN (KAR boe pla tin) is a chemotherapy drug. It targets fast dividing cells, like cancer cells, and causes these cells to die. This medicine is used to treat ovarian cancer and many other cancers. This medicine may be used for other purposes; ask your health care provider or pharmacist if you have questions. COMMON BRAND NAME(S): Paraplatin What should I tell my care team before I take this medication? They need to know if you have any of these conditions: blood disorders hearing problems kidney disease recent or ongoing radiation therapy an unusual or allergic reaction to carboplatin, cisplatin, other chemotherapy, other medicines, foods, dyes, or preservatives pregnant or trying to get pregnant breast-feeding How should I use this medication? This drug is usually given as an infusion into a vein. It is administered in a hospital or clinic by a specially trained health care professional. Talk to your pediatrician regarding the use of this medicine in children. Special care may be needed. Overdosage: If you think you have taken too much of this medicine contact a poison control center or emergency room at once. NOTE: This medicine is only for you. Do not share this medicine with others. What if I miss a dose? It is important not to miss a dose. Call your doctor or health care professional if you are unable to keep an appointment. What may interact with this medication? medicines for seizures medicines to increase blood counts like filgrastim, pegfilgrastim, sargramostim some antibiotics like amikacin, gentamicin, neomycin, streptomycin, tobramycin vaccines Talk to your doctor or health care professional before taking any of these medicines: acetaminophen aspirin ibuprofen ketoprofen naproxen This list may not describe all possible interactions. Give your health care provider a list of all the medicines, herbs,  non-prescription drugs, or dietary supplements you use. Also tell them if you smoke, drink alcohol, or use illegal drugs. Some items may interact with your medicine. What should I watch for while using this medication? Your condition will be monitored carefully while you are receiving this medicine. You will need important blood work done while you are taking this medicine. This drug may make you feel generally unwell. This is not uncommon, as chemotherapy can affect healthy cells as well as cancer cells. Report any side effects. Continue your course of treatment even though you feel ill unless your doctor tells you to stop. In some cases, you may be given additional medicines to help with side effects. Follow all directions for their use. Call your doctor or health care professional for advice if you get a fever, chills or sore throat, or other symptoms of a cold or flu. Do not treat yourself. This drug decreases your body's ability to fight infections. Try to avoid being around people who are sick. This medicine may increase your risk to bruise or bleed. Call your doctor or health care professional if you notice any unusual bleeding. Be careful brushing and flossing your teeth or using a toothpick because you may get an infection or bleed more easily. If you have any dental work done, tell your dentist you are receiving this medicine. Avoid taking products that contain aspirin, acetaminophen, ibuprofen, naproxen, or  ketoprofen unless instructed by your doctor. These medicines may hide a fever. Do not become pregnant while taking this medicine. Women should inform their doctor if they wish to become pregnant or think they might be pregnant. There is a potential for serious side effects to an unborn child. Talk to your health care professional or pharmacist for more information. Do not breast-feed an infant while taking this medicine. What side effects may I notice from receiving this medication? Side  effects that you should report to your doctor or health care professional as soon as possible: allergic reactions like skin rash, itching or hives, swelling of the face, lips, or tongue signs of infection - fever or chills, cough, sore throat, pain or difficulty passing urine signs of decreased platelets or bleeding - bruising, pinpoint red spots on the skin, black, tarry stools, nosebleeds signs of decreased red blood cells - unusually weak or tired, fainting spells, lightheadedness breathing problems changes in hearing changes in vision chest pain high blood pressure low blood counts - This drug may decrease the number of white blood cells, red blood cells and platelets. You may be at increased risk for infections and bleeding. nausea and vomiting pain, swelling, redness or irritation at the injection site pain, tingling, numbness in the hands or feet problems with balance, talking, walking trouble passing urine or change in the amount of urine Side effects that usually do not require medical attention (report to your doctor or health care professional if they continue or are bothersome): hair loss loss of appetite metallic taste in the mouth or changes in taste This list may not describe all possible side effects. Call your doctor for medical advice about side effects. You may report side effects to FDA at 1-800-FDA-1088. Where should I keep my medication? This drug is given in a hospital or clinic and will not be stored at home. NOTE: This sheet is a summary. It may not cover all possible information. If you have questions about this medicine, talk to your doctor, pharmacist, or health care provider.  2022 Elsevier/Gold Standard (2007-12-26 14:38:05)      To help prevent nausea and vomiting after your treatment, we encourage you to take your nausea medication as directed.  BELOW ARE SYMPTOMS THAT SHOULD BE REPORTED IMMEDIATELY: *FEVER GREATER THAN 100.4 F (38 C) OR  HIGHER *CHILLS OR SWEATING *NAUSEA AND VOMITING THAT IS NOT CONTROLLED WITH YOUR NAUSEA MEDICATION *UNUSUAL SHORTNESS OF BREATH *UNUSUAL BRUISING OR BLEEDING *URINARY PROBLEMS (pain or burning when urinating, or frequent urination) *BOWEL PROBLEMS (unusual diarrhea, constipation, pain near the anus) TENDERNESS IN MOUTH AND THROAT WITH OR WITHOUT PRESENCE OF ULCERS (sore throat, sores in mouth, or a toothache) UNUSUAL RASH, SWELLING OR PAIN  UNUSUAL VAGINAL DISCHARGE OR ITCHING   Items with * indicate a potential emergency and should be followed up as soon as possible or go to the Emergency Department if any problems should occur.  Please show the CHEMOTHERAPY ALERT CARD or IMMUNOTHERAPY ALERT CARD at check-in to the Emergency Department and triage nurse.  Should you have questions after your visit or need to cancel or reschedule your appointment, please contact [facility]  [phone] and follow the prompts.  Office hours are 8:00 a.m. to 4:30 p.m. Monday - Friday. Please note that voicemails left after 4:00 p.m. may not be returned until the following business day.  We are closed weekends and major holidays. You have access to a nurse at all times for urgent questions. Please  call the main number to the clinic [phone] and follow the prompts.  For any non-urgent questions, you may also contact your provider using MyChart. We now offer e-Visits for anyone 59 and older to request care online for non-urgent symptoms. For details visit [email or web address].   Also download the MyChart app! Go to the app store, search "MyChart", open the app, select [facility], and log in with your MyChart username and password.  Due to Covid, a mask is required upon entering the hospital/clinic. If you do not have a mask, one will be given to you upon arrival. For doctor visits, patients may have 1 support person aged 63 or older with them. For treatment visits,  patients cannot have anyone with them due to current Covid guidelines and our immunocompromised population.

## 2021-06-15 NOTE — Progress Notes (Signed)
Rod[facility] Vega NOTE  Patient Care Team: [name], [name], [name], [name]    Oncology History Overview Note  AUG 2022- 8.2 x 5.9 cm spiculated mass noted in right upper lobe consistent with malignancy. It extends from the right hilum to the lateral chest wall and results in lytic destruction of the lateral portion of the right fourth rib.  Mediastinal lymph nodes are noted, including 12 mm right hilar lymph node, consistent with metastatic disease. 3 cm right adrenal mass is noted concerning for metastatic disease.  # AUG 2022- PET IMPRESSION: Peripherally hypermetabolic right upper lobe mass, likely due to central necrosis. Mass extends into the right hilum and right lateral chest wall involving the second through fourth right ribs.   Mildly enlarged and hypermetabolic right hilar lymph node.   No evidence of metastatic disease in the abdomen or pelvis.  # AUG, 2022-RIGHT CHEST WALL Bx-necrosis; suspicious for malignancy not definitive [discussed with Dr.Kraynie;MDT] LUNG MASS, RIGHT; BIOPSY:  - SUSPICIOUS FOR MALIGNANCY.  - SEE COMMENT.   Comment:  Approximately six tissue cores demonstrate inflamed fibrous capsule with  hemosiderin-laden macrophages, in a background of abundant necrosis.  One of the cores demonstrates a minute fragment of viable epithelial  cells.  These cells are positive for p40.  They are negative for TTF1  and CK7. The cells of interest disappear on deeper cut levels.  While  the findings are suspicious for squamous cell carcinoma, there is not  enough viable tumor for a definitive diagnosis.  Additional tissue  sampling may be helpful.    Primary cancer of right upper lobe of lung ([place])  06/03/2021 Initial Diagnosis   Primary cancer of right  upper lobe of lung ([place])   06/03/2021 Cancer Staging   Staging form: Lung, AJCC 8th Edition - Clinical: Stage IIIB (cT3, cN2, cM0) - Signed by [name], MD on 06/03/2021   06/15/2021 -  Chemotherapy    Patient is on Treatment Plan: LUNG CARBOPLATIN / PACLITAXEL + XRT Q7D          HISTORY OF PRESENTING ILLNESS: Ambulating independently.  Accompanied by daughter and wife.  Rodney Vega 65 y.o.  male history of smoking -"lung cancer" [Limited necrotic tissue]-stage III unresectable is here to proceed with chemoradiation.  Patient continues to have right chest wall pain-fairly controlled with tramadol/Tylenol.  Denies any nausea vomiting headaches.  Review of Systems  Constitutional:  Negative for chills, diaphoresis, fever, malaise/fatigue and weight loss.  HENT:  Negative for nosebleeds and sore throat.   Eyes:  Negative for double vision.  Respiratory:  Positive for shortness of breath. Negative for cough, hemoptysis, sputum production and wheezing.   Cardiovascular:  Negative for chest pain, palpitations, orthopnea and leg swelling.  Gastrointestinal:  Negative for abdominal pain, blood in stool, constipation, diarrhea, heartburn, melena, nausea and vomiting.  Genitourinary:  Negative for dysuria, frequency and urgency.  Musculoskeletal:  Negative for back pain and joint pain.  Skin: Negative.  Negative for itching and rash.  Neurological:  Positive for tingling. Negative for dizziness, focal weakness, weakness and headaches.  Endo/Heme/Allergies:  Does not bruise/bleed easily.  Psychiatric/Behavioral:  Negative for depression. The patient is not nervous/anxious and does not have insomnia.     MEDICAL HISTORY:  Past Medical History:  Diagnosis Date  . Cancer ([place])   . Degenerative  arthritis of knee   . Depression   . Diverticulitis   . Glaucoma    since 2004  . Hernia 1979  . History of kidney stones   . Hypertension   . Personal history of tobacco use,  presenting hazards to health   . Pre-diabetes   . Screening for obesity   . Special screening for malignant neoplasms, colon   . Wears dentures    full upper and lower    SURGICAL HISTORY: Past Surgical History:  Procedure Laterality Date  . [place], 2012   lumbar bulging disc  . CATARACT EXTRACTION W/PHACO Left 01/20/2021   Procedure: CATARACT EXTRACTION PHACO AND INTRAOCULAR LENS PLACEMENT ([place]) LEFT;  Surgeon: Birder Robson, MD;  Location: [facility];  Service: Ophthalmology;  Laterality: Left;  10.13 0:52.1  . COLONOSCOPY  [id number]   [facility]/Dr. Buelah Manis sessile polyp,51mm, found in rectum,multiple diverticula were found in the sigmoid, in descending and in transverse colon  . COLOSTOMY  04-20-13  . HERNIA REPAIR  1979   inguinal  . IR IMAGING GUIDED PORT INSERTION  06/10/2021  . JOINT REPLACEMENT Right    knee  . KNEE ARTHROPLASTY Right 05/08/2018   Procedure: COMPUTER ASSISTED TOTAL KNEE ARTHROPLASTY;  Surgeon: Dereck Leep, MD;  Location: [facility];  Service: Orthopedics;  Laterality: Right;  . KNEE ARTHROPLASTY Left 11/16/2019   Procedure: COMPUTER ASSISTED TOTAL KNEE ARTHROPLASTY;  Surgeon: Dereck Leep, MD;  Location: [facility];  Service: Orthopedics;  Laterality: Left;  . LAPAROTOMY  04-20-13   colon resection with colosotmy  . LITHOTRIPSY  2013  . TONSILLECTOMY AND ADENOIDECTOMY  1962    SOCIAL HISTORY: Social History   Socioeconomic History  . Marital status: Single    Spouse name: Not on file  . Number of children: Not on file  . Years of education: Not on file  . Highest education level: Not on file  Occupational History  . Not on file  Tobacco Use  . Smoking status: Every Day    Packs/day: 1.00    Years: 20.00    Pack years: 20.00    Types: Cigarettes  . Smokeless tobacco: Never  . Tobacco comments:    since age 1 or 76  Vaping Use  . Vaping Use: Never used  Substance and Sexual Activity  . Alcohol use: Yes    Comment:  occassional  . Drug use: Yes    Types: Marijuana  . Sexual activity: Not on file  Other Topics Concern  . Not on file  Social History Narrative   15 mins [place]; smoking; not much alcohol. Worked for [employer], 2019. 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 Diabetes Mother   . Heart disease Mother   . Heart attack Father   . Prostate cancer Father     ALLERGIES:  is allergic to aleve [naproxen sodium].  MEDICATIONS:  Current Outpatient Medications  Medication Sig Dispense Refill  . acetaminophen (TYLENOL) 500 MG tablet Take 1,000 mg by mouth daily.    Marland Kitchen albuterol (VENTOLIN HFA) 108 (90 Base) MCG/ACT inhaler Inhale 2 puffs into the lungs every 6 (six) hours  as needed for wheezing or shortness of breath. 1 each 2  . cetirizine (ZYRTEC) 10 MG tablet Take 10 mg by mouth daily.    Marland Kitchen dexamethasone (DECADRON) 4 MG tablet Take 2 tablets (8 mg total) by mouth daily. Start the day after chemotherapy for 2 days. 30 tablet 1  . diphenhydramine-acetaminophen (TYLENOL PM) 25-500 MG TABS Take 1-2 tablets by mouth at bedtime as needed (sleep.).     Marland Kitchen dorzolamide-timolol (COSOPT) 22.3-6.8 MG/ML ophthalmic solution Place 1 drop into both eyes 2 (two) times daily.    Marland Kitchen guaiFENesin (MUCINEX) 600 MG 12 hr tablet Take by mouth 2 (two) times daily as needed.    . latanoprost (XALATAN) 0.005 % ophthalmic solution Place 1 drop into both eyes at bedtime.    . lidocaine-prilocaine (EMLA) cream Apply to affected area once 30 g 3  . nicotine (NICODERM CQ - DOSED IN MG/24 HOURS) 21 mg/24hr patch Place 1 patch (21 mg total) onto the skin daily. 28 patch 1  . olmesartan (BENICAR) 40 MG tablet  Take 40 mg by mouth daily.    . ondansetron (ZOFRAN) 8 MG tablet Take 1 tablet (8 mg total) by mouth 2 (two) times daily as needed for refractory nausea / vomiting. Start on day 3 after chemo. 30 tablet 1  . prochlorperazine (COMPAZINE) 10 MG tablet Take 1 tablet (10 mg total) by mouth every 6 (six) hours as needed (Nausea or vomiting). 30 tablet 1  . traMADol (ULTRAM) 50 MG tablet TAKE 1 TABLET(50 MG) BY MOUTH EVERY 8 HOURS AS NEEDED 60 tablet 0  . traZODone (DESYREL) 50 MG tablet Take 1 tablet (50 mg total) by mouth at bedtime as needed for sleep. 30 tablet 1  . venlafaxine XR (EFFEXOR-XR) 75 MG 24 hr capsule Take 225 mg by mouth daily.    . Cyanocobalamin (VITAMIN B-12 PO) Take by mouth daily. (Patient not taking: Reported on 06/15/2021)     No current facility-administered medications for this visit.      Marland Kitchen  PHYSICAL EXAMINATION: ECOG PERFORMANCE STATUS: 1 - Symptomatic but completely ambulatory  Vitals:   06/15/21 0844  BP: (!) 164/82  Resp: 20  Temp: 98.6 F (37 C)  SpO2: 98%    Filed Weights   06/15/21 0844  Weight: 196 lb 9.6 oz (89.2 kg)     Physical Exam Vitals and nursing note reviewed.  HENT:     Head: Normocephalic and atraumatic.     Mouth/Throat:     Pharynx: Oropharynx is clear.  Eyes:     Extraocular Movements: Extraocular movements intact.     Pupils: Pupils are equal, round, and reactive to light.  Cardiovascular:     Rate and Rhythm: Normal rate and regular rhythm.  Pulmonary:     Comments: Decreased breath sounds bilaterally.  Abdominal:     Palpations: Abdomen is soft.  Musculoskeletal:        General: Normal range of motion.     Cervical back: Normal range of motion.  Skin:    General: Skin is warm.  Neurological:     General: No focal deficit present.     Mental Status: He is alert and oriented to person, place, and time.  Psychiatric:        Behavior: Behavior normal.        Judgment: Judgment normal.     LABORATORY DATA:  I have  reviewed the data as listed Lab Results  Component Value Date   WBC 10.6 (H) 06/15/2021  HGB 13.3 06/15/2021   HCT 38.9 (L) 06/15/2021   MCV 89.8 06/15/2021   PLT 249 06/15/2021   Recent Labs    05/19/21 0755 05/20/21 1443 06/15/21 0825  NA  --  137 135  K  --  3.4* 3.6  CL  --  100 100  CO2  --  30 28  GLUCOSE  --  228* 171*  BUN  --  7* 11  CREATININE 1.10 1.11 1.06  CALCIUM  --  9.0 8.9  GFRNONAA  --  >60 >60  PROT  --  7.5 7.3  ALBUMIN  --  4.1 3.9  AST  --  18 17  ALT  --  10 11  ALKPHOS  --  112 109  BILITOT  --  0.4 0.8    RADIOGRAPHIC STUDIES: I have personally reviewed the radiological images as listed and agreed with the findings in the report. CT CHEST W CONTRAST  Result Date: 05/19/2021 CLINICAL DATA:  Abnormal chest x-ray.  Cough. EXAM: CT CHEST WITH CONTRAST TECHNIQUE: Multidetector CT imaging of the chest was performed during intravenous contrast administration. CONTRAST:  55mL OMNIPAQUE IOHEXOL 350 MG/ML SOLN COMPARISON:  None available currently. FINDINGS: Cardiovascular: Atherosclerosis of thoracic aorta is noted without aneurysm or dissection. Normal cardiac size. No pericardial effusion. Mild coronary artery calcifications are noted. Mediastinum/Nodes: Thyroid gland and esophagus are unremarkable. 8 mm precarinal lymph node is noted. 8 mm subcarinal lymph node is noted. 12 mm right hilar lymph node is noted. Lungs/Pleura: No pneumothorax or pleural effusion is noted. Mild left apical scarring is noted. 8.2 x 5.9 cm spiculated mass is noted in right upper lobe which extends from the right hilum to the lateral chest wall and results in lytic destruction of the lateral portion of the right fourth rib. Upper Abdomen: 3 cm right adrenal mass is noted concerning for metastatic disease. Musculoskeletal: As noted previously, there is lytic destruction of the lateral portion of the right fourth rib secondary to right upper lobe malignancy. IMPRESSION: 8.2 x 5.9 cm  spiculated mass noted in right upper lobe consistent with malignancy. It extends from the right hilum to the lateral chest wall and results in lytic destruction of the lateral portion of the right fourth rib. These results will be called to the ordering clinician or representative by the Radiologist Assistant, and communication documented in the PACS or zVision Dashboard. Mediastinal lymph nodes are noted, including 12 mm right hilar lymph node, consistent with metastatic disease. 3 cm right adrenal mass is noted concerning for metastatic disease. Mild coronary artery calcifications are noted. Aortic Atherosclerosis (ICD10-I70.0) and Emphysema (ICD10-J43.9). Electronically Signed   By: Marijo Conception M.D.   On: 05/19/2021 09:04   MR Brain W Wo Contrast  Result Date: 06/13/2021 CLINICAL DATA:  Non-small cell lung cancer, staging EXAM: MRI HEAD WITHOUT AND WITH CONTRAST TECHNIQUE: Multiplanar, multiecho pulse sequences of the brain and surrounding structures were obtained without and with intravenous contrast. CONTRAST:  39mL GADAVIST GADOBUTROL 1 MMOL/ML IV SOLN COMPARISON:  None. FINDINGS: Brain: No acute infarction, hemorrhage, hydrocephalus, extra-axial collection or mass lesion. Mild scattered T2 hyperintensities within the white matter, nonspecific but compatible with chronic microvascular ischemic disease. Vascular: Major arterial flow voids are maintained at the skull base. Skull and upper cervical spine: Normal marrow signal. Sinuses/Orbits: Very small air-fluid level in left maxillary sinus. No acute orbital abnormality. Other: No mastoid effusions. IMPRESSION: 1. No evidence of acute intracranial abnormality or metastatic disease. 2. Mild chronic microvascular ischemic disease.  Electronically Signed   By: Margaretha Sheffield M.D.   On: 06/13/2021 12:22   NM PET Image Initial (PI) Skull Base To Thigh (F-18 FDG)  Addendum Date: 06/10/2021   ADDENDUM REPORT: 06/10/2021 14:44 ADDENDUM: Right adrenal nodule  demonstrates no FDG activity and has been present dating back to 2013 prior exam, compatible with benign adenoma. Electronically Signed   By: Yetta Glassman M.D.   On: 06/10/2021 14:44   Result Date: 06/10/2021 CLINICAL DATA:  Initial treatment strategy for right upper lobe mass EXAM: NUCLEAR MEDICINE PET SKULL BASE TO THIGH TECHNIQUE: 11.25 mCi F-18 FDG was injected intravenously. Full-ring PET imaging was performed from the skull base to thigh after the radiotracer. CT data was obtained and used for attenuation correction and anatomic localization. Fasting blood glucose: 144 mg/dl COMPARISON:  None. FINDINGS: Mediastinal blood pool activity: SUV max 2.2 Liver activity: SUV max 4.6 NECK: No hypermetabolic lymph nodes in the neck. Incidental CT findings: none CHEST: Peripherally hypermetabolic right upper lobe mass, likely due to central necrosis, with an SUV max of 8.7. Mass extends into the right hilum causing narrowing of the right upper lobe bronchus. Mildly enlarged hypermetabolic right hilar lymph node measuring 1.2 cm in short axis with an SUV max of 5.9. Mediastinal lymph nodes with FDG activity similar to mediastinal blood pool. Reference right lower paratracheal lymph node measuring 9 mm in short axis with an SUV max of 2.4. Incidental CT findings: none ABDOMEN/PELVIS: No abnormal hypermetabolic activity within the liver, pancreas, adrenal glands, or spleen. No hypermetabolic lymph nodes in the abdomen or pelvis. Incidental CT findings: Prominent left extrarenal pelvis. Nonobstructing stones of the left kidney. Left lower quadrant colostomy. SKELETON: Osseous destruction of the lateral second through fourth ribs adjacent to the right upper lobe lung mass. Incidental CT findings: none IMPRESSION: Peripherally hypermetabolic right upper lobe mass, likely due to central necrosis. Mass extends into the right hilum and right lateral chest wall involving the second through fourth right ribs. Mildly enlarged  and hypermetabolic right hilar lymph node. No evidence of metastatic disease in the abdomen or pelvis. Electronically Signed: By: Yetta Glassman M.D. On: 05/26/2021 09:30   CT BIOPSY  Result Date: 05/29/2021 INDICATION: Right lung mass EXAM: CT BIOPSY MEDICATIONS: None. ANESTHESIA/SEDATION: Moderate (conscious) sedation was employed during this procedure. A total of Versed 0.5 mg and Fentanyl 25 mcg was administered intravenously. Moderate Sedation Time: 14 minutes. The patient's level of consciousness and vital signs were monitored continuously by radiology nursing throughout the procedure under my direct supervision. FLUOROSCOPY TIME:  N/a COMPLICATIONS: None immediate. PROCEDURE: Informed written consent was obtained from the patient after a thorough discussion of the procedural risks, benefits and alternatives. All questions were addressed. Maximal Sterile Barrier Technique was utilized including caps, mask, sterile gowns, sterile gloves, sterile drape, hand hygiene and skin antiseptic. A timeout was performed prior to the initiation of the procedure. The patient was placed supine on the CT exam table. Limited CT of the chest was performed for planning purposes. The significant inserted a large right lung mass. Skin entry site was marked, and the overlying skin was prepped and draped in the standard sterile fashion. Local analgesia was obtained with 1% lidocaine. Using CT fluoroscopy, a 17 gauge introducer needle was advanced just deep to the right lung mass. Subsequently, core needle biopsy was performed using an 18 gauge core biopsy device x5 passes. Specimens were submitted in formalin to pathology for further handling. Limited postprocedure imaging demonstrated no complicating feature or pneumothorax.  A clean dressing was placed. The patient tolerated the procedure well without immediate complication. IMPRESSION: Successful CT-guided core needle biopsy of right lung mass. Electronically Signed   By:  Albin Felling M.D.   On: 05/29/2021 11:41   DG Chest Port 1 View  Result Date: 05/29/2021 CLINICAL DATA:  Status post right lung biopsy EXAM: PORTABLE CHEST 1 VIEW COMPARISON:  None. FINDINGS: The cardiomediastinal silhouette is within normal limits. No pleural effusion. No pneumothorax. Again seen is the large right upper lung mass. Coarse interstitial markings. No acute osseous abnormality. IMPRESSION: No evidence of pneumothorax after right lung mass biopsy. Electronically Signed   By: Albin Felling M.D.   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 Port-A-Cath placement requested for chemotherapy administration. EXAM: IMPLANTED PORT A CATH PLACEMENT WITH ULTRASOUND AND FLUOROSCOPIC GUIDANCE ANESTHESIA/SEDATION: 2.0 mg IV Versed; 100 mcg IV Fentanyl Total Moderate Sedation Time:  44 minutes The patient's level of consciousness and physiologic status were continuously monitored during the procedure by Radiology nursing. FLUOROSCOPY TIME:  30 seconds.  3.4 mGy. PROCEDURE: The procedure, risks, benefits, and alternatives were explained to the patient. Questions regarding the procedure were encouraged and answered. The patient understands and consents to the procedure. A time-out was performed prior to initiating the procedure. Ultrasound was utilized to confirm patency of the left internal jugular vein. The left neck and chest were prepped with chlorhexidine in a sterile fashion, and a sterile drape was applied covering the operative field. Maximum barrier sterile technique with sterile gowns and gloves were used for the procedure. Local anesthesia was provided with 1% lidocaine. After creating a small venotomy incision, a 21 gauge needle was advanced into the left internal jugular vein under direct, real-time ultrasound guidance. Ultrasound  image documentation was performed. After securing guidewire access, an 8 Fr dilator was placed. A J-wire was kinked to measure appropriate catheter length. A subcutaneous port pocket was then created along the upper chest wall utilizing sharp and blunt dissection. Portable cautery was utilized. The pocket was irrigated with sterile saline. A single lumen power injectable port was chosen for placement. The 8 Fr catheter was tunneled from the port pocket site to the venotomy incision. The port was placed in the pocket. External catheter was trimmed to appropriate length based on guidewire measurement. At the venotomy, an 8 Fr peel-away sheath was placed over a guidewire. The catheter was then placed through the sheath and the sheath removed. Final catheter positioning was confirmed and documented with a fluoroscopic spot image. The port was accessed with a needle and aspirated and flushed with heparinized saline. The access needle was removed. The venotomy and port pocket incisions were closed with subcutaneous 3-0 Monocryl and subcuticular 4-0 Vicryl. Dermabond was applied to both incisions. COMPLICATIONS: COMPLICATIONS None FINDINGS: After catheter placement, the tip lies at the cavo-atrial junction. The catheter aspirates normally and is ready for immediate use. IMPRESSION: Placement of single lumen port a cath via left internal jugular vein. The catheter tip lies at the cavo-atrial junction. A power injectable port a cath was placed and is ready for immediate use. Electronically Signed   By: Aletta Edouard M.D.   On: 06/10/2021 15:28    ASSESSMENT & PLAN:   Primary cancer of right upper lobe of lung ([place]) #Right upper lobe lung mass -concerning for malignancy.  8.2  x 5.9 cm -extends from the right hilum to the lateral chest wall and results in lytic destruction of the lateral portion of the right fourth rib; also noted to have mediastinal adenopathy/hilar lymph nodes; and a 3 cm adrenal mass on the right.  SEP 2022 MRI of the brain-WNL.  August 2022-PET scan above. lung findings; adrenal lesion non-avid.Biopsy unfortunately not definitive-however atypical cells suggestive of malignancy noted-no necrosis.  Reviewed at tumor conference.   # Proceed with carbo-taxol with RT [last RT 10/28]; Labs today reviewed;  acceptable for treatment today.   #Right chest wall pain-clinically stable on Tylenol/tramadol [Qhs-2; 5-6 tramadol a day]  # COPD- STABLE; albuterol.  #Smoking: Counseled regarding quitting smoking; recommend putting the patch on.   #Prediabetes: FBS: 171- We need to monitor blood sugar levels on therapies.  #IV mediport: functioning/STABLE.  tati # DISPOSITION: # Chemo today # 1 week- labs- cbc/cmo;carbo-taxol # follow up in 2 weeks- , MD- labs- cbc/cmp;carbo-taxol-Dr.B         All questions were answered. The patient knows to call the clinic with any problems, questions or concerns.       [name], MD 06/15/2021 11:59 PM

## 2021-06-15 NOTE — Assessment & Plan Note (Signed)
#  Right upper lobe lung mass -concerning for malignancy.  8.2 x 5.9 cm -extends from the right hilum to the lateral chest wall and results in lytic destruction of the lateral portion of the right fourth rib; also noted to have mediastinal adenopathy/hilar lymph nodes; and a 3 cm adrenal mass on the right. SEP 2022 MRI of the brain-WNL.  August 2022-PET scan above. lung findings; adrenal lesion non-avid.Biopsy unfortunately not definitive-however atypical cells suggestive of malignancy noted-no necrosis.  Reviewed at tumor conference.   # Proceed with carbo-taxol with RT [last RT 10/28]; Labs today reviewed;  acceptable for treatment today.   #Right chest wall pain-clinically stable on Tylenol/tramadol [Qhs-2; 5-6 tramadol a day]  # COPD- STABLE; albuterol.  #Smoking: Counseled regarding quitting smoking; recommend putting the patch on.   #Prediabetes: FBS: 171- We need to monitor blood sugar levels on therapies.  #IV mediport: functioning/STABLE.  tati # DISPOSITION: # Chemo today # 1 week- labs- cbc/cmo;carbo-taxol # follow up in 2 weeks- , MD- labs- cbc/cmp;carbo-taxol-Dr.B

## 2021-06-16 ENCOUNTER — Telehealth: Payer: Self-pay

## 2021-06-16 ENCOUNTER — Ambulatory Visit
Admission: RE | Admit: 2021-06-16 | Discharge: 2021-06-16 | Disposition: A | Discharge status: Home / Self Care | Payer: Medicare PPO | Source: Ambulatory Visit | Attending: Radiation Oncology | Admitting: Radiation Oncology

## 2021-06-16 DIAGNOSIS — Z510 Encounter for antineoplastic radiation therapy: Secondary | ICD-10-CM | POA: Diagnosis not present

## 2021-06-16 NOTE — Telephone Encounter (Signed)
Telephone call to patient for follow up after receiving first infusion.   Patient states infusion went great.  States eating good and drinking plenty of fluids.   Denies any nausea or vomiting.  Encouraged patient to call for any concerns or questions. 

## 2021-06-17 ENCOUNTER — Encounter: Payer: Self-pay | Admitting: *Deleted

## 2021-06-17 ENCOUNTER — Ambulatory Visit
Admission: RE | Admit: 2021-06-17 | Discharge: 2021-06-17 | Disposition: A | Discharge status: Home / Self Care | Payer: Medicare PPO | Source: Ambulatory Visit | Attending: Radiation Oncology | Admitting: Radiation Oncology

## 2021-06-17 DIAGNOSIS — Z510 Encounter for antineoplastic radiation therapy: Secondary | ICD-10-CM | POA: Diagnosis not present

## 2021-06-18 ENCOUNTER — Ambulatory Visit
Admission: RE | Admit: 2021-06-18 | Discharge: 2021-06-18 | Disposition: A | Discharge status: Home / Self Care | Payer: Medicare PPO | Source: Ambulatory Visit | Attending: Radiation Oncology | Admitting: Radiation Oncology

## 2021-06-18 DIAGNOSIS — Z510 Encounter for antineoplastic radiation therapy: Secondary | ICD-10-CM | POA: Diagnosis not present

## 2021-06-19 ENCOUNTER — Ambulatory Visit
Admission: RE | Admit: 2021-06-19 | Discharge: 2021-06-19 | Disposition: A | Discharge status: Home / Self Care | Payer: Medicare PPO | Source: Ambulatory Visit | Attending: Radiation Oncology | Admitting: Radiation Oncology

## 2021-06-19 DIAGNOSIS — Z510 Encounter for antineoplastic radiation therapy: Secondary | ICD-10-CM | POA: Diagnosis not present

## 2021-06-22 ENCOUNTER — Ambulatory Visit
Admission: RE | Admit: 2021-06-22 | Discharge: 2021-06-22 | Disposition: A | Discharge status: Home / Self Care | Payer: Medicare PPO | Source: Ambulatory Visit | Attending: Radiation Oncology | Admitting: Radiation Oncology

## 2021-06-22 ENCOUNTER — Other Ambulatory Visit: Payer: Self-pay | Admitting: Internal Medicine

## 2021-06-22 ENCOUNTER — Inpatient Hospital Stay: Payer: Medicare PPO

## 2021-06-22 VITALS — BP 136/66 | HR 101 | Temp 98.3°F | Resp 20 | Wt 207.0 lb

## 2021-06-22 DIAGNOSIS — C3411 Malignant neoplasm of upper lobe, right bronchus or lung: Secondary | ICD-10-CM

## 2021-06-22 DIAGNOSIS — C3490 Malignant neoplasm of unspecified part of unspecified bronchus or lung: Secondary | ICD-10-CM

## 2021-06-22 DIAGNOSIS — Z510 Encounter for antineoplastic radiation therapy: Secondary | ICD-10-CM | POA: Diagnosis not present

## 2021-06-22 LAB — COMPREHENSIVE METABOLIC PANEL
ALT: 13 U/L (ref 0–44)
AST: 18 U/L (ref 15–41)
Albumin: 3.9 g/dL (ref 3.5–5.0)
Alkaline Phosphatase: 95 U/L (ref 38–126)
Anion gap: 10 (ref 5–15)
BUN: 16 mg/dL (ref 8–23)
CO2: 26 mmol/L (ref 22–32)
Calcium: 9 mg/dL (ref 8.9–10.3)
Chloride: 98 mmol/L (ref 98–111)
Creatinine, Ser: 1 mg/dL (ref 0.61–1.24)
GFR, Estimated: >60 mL/min (ref >60)
Glucose, Bld: 252 mg/dL — ABNORMAL HIGH (ref 70–99)
Potassium: 3.6 mmol/L (ref 3.5–5.1)
Sodium: 134 mmol/L — ABNORMAL LOW (ref 135–145)
Total Bilirubin: 0.5 mg/dL (ref 0.3–1.2)
Total Protein: 7.4 g/dL (ref 6.5–8.1)

## 2021-06-22 LAB — CBC WITH DIFFERENTIAL/PLATELET
Abs Immature Granulocytes: 0.06 10*3/uL (ref 0.00–0.07)
Basophils Absolute: 0.1 10*3/uL (ref 0.0–0.1)
Basophils Relative: 1 %
Eosinophils Absolute: 0.2 10*3/uL (ref 0.0–0.5)
Eosinophils Relative: 2 %
HCT: 38.6 % — ABNORMAL LOW (ref 39.0–52.0)
Hemoglobin: 13.4 g/dL (ref 13.0–17.0)
Immature Granulocytes: 1 %
Lymphocytes Relative: 16 %
Lymphs Abs: 1.5 10*3/uL (ref 0.7–4.0)
MCH: 30.8 pg (ref 26.0–34.0)
MCHC: 34.7 g/dL (ref 30.0–36.0)
MCV: 88.7 fL (ref 80.0–100.0)
Monocytes Absolute: 0.5 10*3/uL (ref 0.1–1.0)
Monocytes Relative: 6 %
Neutro Abs: 6.6 10*3/uL (ref 1.7–7.7)
Neutrophils Relative %: 74 %
Platelets: 244 10*3/uL (ref 150–400)
RBC: 4.35 MIL/uL (ref 4.22–5.81)
RDW: 12.8 % (ref 11.5–15.5)
WBC: 9 10*3/uL (ref 4.0–10.5)
nRBC: 0 % (ref 0.0–0.2)

## 2021-06-22 MED ORDER — SODIUM CHLORIDE 0.9 % IV SOLN
45.0000 mg/m2 | Freq: Once | INTRAVENOUS | Status: AC
  Administered 2021-06-22: 96 mg via INTRAVENOUS
  Filled 2021-06-22: qty 16

## 2021-06-22 MED ORDER — ALBUTEROL SULFATE HFA 108 (90 BASE) MCG/ACT IN AERS
1.0000 | INHALATION_SPRAY | Freq: Once | RESPIRATORY_TRACT | Status: AC
  Administered 2021-06-22: 2 via RESPIRATORY_TRACT
  Filled 2021-06-22 (×2): qty 6.7

## 2021-06-22 MED ORDER — SODIUM CHLORIDE 0.9% FLUSH
10.0000 mL | INTRAVENOUS | Status: DC | PRN
  Administered 2021-06-22: 10 mL
  Filled 2021-06-22: qty 10

## 2021-06-22 MED ORDER — SODIUM CHLORIDE 0.9 % IV SOLN
230.0000 mg | Freq: Once | INTRAVENOUS | Status: AC
  Administered 2021-06-22: 230 mg via INTRAVENOUS
  Filled 2021-06-22: qty 23

## 2021-06-22 MED ORDER — PALONOSETRON HCL INJECTION 0.25 MG/5ML
0.2500 mg | Freq: Once | INTRAVENOUS | Status: AC
  Administered 2021-06-22: 0.25 mg via INTRAVENOUS
  Filled 2021-06-22: qty 5

## 2021-06-22 MED ORDER — SODIUM CHLORIDE 0.9 % IV SOLN
Freq: Once | INTRAVENOUS | Status: DC | PRN
  Filled 2021-06-22: qty 250

## 2021-06-22 MED ORDER — FAMOTIDINE 20 MG IN NS 100 ML IVPB
20.0000 mg | Freq: Once | INTRAVENOUS | Status: AC | PRN
  Administered 2021-06-22: 20 mg via INTRAVENOUS
  Filled 2021-06-22: qty 100

## 2021-06-22 MED ORDER — METHYLPREDNISOLONE SODIUM SUCC 125 MG IJ SOLR
125.0000 mg | Freq: Once | INTRAMUSCULAR | Status: AC | PRN
  Administered 2021-06-22: 125 mg via INTRAVENOUS

## 2021-06-22 MED ORDER — FAMOTIDINE 20 MG IN NS 100 ML IVPB
20.0000 mg | Freq: Once | INTRAVENOUS | Status: AC
Start: 2021-06-22 — End: 2021-06-22
  Administered 2021-06-22: 20 mg via INTRAVENOUS
  Filled 2021-06-22: qty 20

## 2021-06-22 MED ORDER — SODIUM CHLORIDE 0.9 % IV SOLN
10.0000 mg | Freq: Once | INTRAVENOUS | Status: AC
  Administered 2021-06-22: 10 mg via INTRAVENOUS
  Filled 2021-06-22: qty 10

## 2021-06-22 MED ORDER — HEPARIN SOD (PORK) LOCK FLUSH 100 UNIT/ML IV SOLN
500.0000 [IU] | Freq: Once | INTRAVENOUS | Status: AC | PRN
  Administered 2021-06-22: 500 [IU]
  Filled 2021-06-22: qty 5

## 2021-06-22 MED ORDER — DIPHENHYDRAMINE HCL 50 MG/ML IJ SOLN
50.0000 mg | Freq: Once | INTRAMUSCULAR | Status: AC
  Administered 2021-06-22: 50 mg via INTRAVENOUS
  Filled 2021-06-22: qty 1

## 2021-06-22 MED ORDER — DIPHENHYDRAMINE HCL 50 MG/ML IJ SOLN
50.0000 mg | Freq: Once | INTRAMUSCULAR | Status: AC | PRN
  Administered 2021-06-22: 25 mg via INTRAVENOUS

## 2021-06-22 MED ORDER — SODIUM CHLORIDE 0.9 % IV SOLN
Freq: Once | INTRAVENOUS | Status: AC
  Filled 2021-06-22: qty 250

## 2021-06-22 NOTE — Progress Notes (Signed)
Hypersensitivity Reaction note  Date of event: 06/22/21 Time of event: 1252 Generic name of drug involved: paclitaxel Name of provider notified of the hypersensitivity reaction: Faythe Casa, NP Was agent that likely caused hypersensitivity reaction added to Allergies List within EMR? yes Chain of events including reaction signs/symptoms, treatment administered, and outcome (e.g., drug resumed; drug discontinued; sent to Emergency Department; etc.) Taxol started at 1242, approximately 10 minutes later patient sat up and complaining of trouble breathing, flushed, and diaphoretic. Taxol stopped, IVF bolus started, Benadryl, Pepcid, and solumedrol given. Faythe Casa NP, chairside at 1255. Patient wheezing, sats 94% on room air . 2L via [place] applied. See Flowsheet for VS. Albuterol inhaler given at 1315. 1320 VSS, patient 98% on room air. Dr Rogue Bussing to see patient at 1330. Symptoms resolved. Carboplatin given without any incident. Patient discharged home.   Johnsie Cancel, RN 06/22/2021 3:12 PM

## 2021-06-22 NOTE — Patient Instructions (Signed)
[facility]  Discharge Instructions: Thank you for choosing [facility] to provide your oncology and hematology care.  If you have a lab appointment with the [facility], please go directly to the [facility] and check in at the registration area.  Wear comfortable clothing and clothing appropriate for easy access to any Portacath or PICC line.   We strive to give you quality time with your provider. You may need to reschedule your appointment if you arrive late (15 or more minutes).  Arriving late affects you and other patients whose appointments are after yours.  Also, if you miss three or more appointments without notifying the office, you may be dismissed from the clinic at the provider's discretion.      For prescription refill requests, have your pharmacy contact our office and allow 72 hours for refills to be completed.    Today you received the following chemotherapy and/or immunotherapy agents : Taxol, Carboplatin      To help prevent nausea and vomiting after your treatment, we encourage you to take your nausea medication as directed.  BELOW ARE SYMPTOMS THAT SHOULD BE REPORTED IMMEDIATELY: *FEVER GREATER THAN 100.4 F (38 C) OR HIGHER *CHILLS OR SWEATING *NAUSEA AND VOMITING THAT IS NOT CONTROLLED WITH YOUR NAUSEA MEDICATION *UNUSUAL SHORTNESS OF BREATH *UNUSUAL BRUISING OR BLEEDING *URINARY PROBLEMS (pain or burning when urinating, or frequent urination) *BOWEL PROBLEMS (unusual diarrhea, constipation, pain near the anus) TENDERNESS IN MOUTH AND THROAT WITH OR WITHOUT PRESENCE OF ULCERS (sore throat, sores in mouth, or a toothache) UNUSUAL RASH, SWELLING OR PAIN  UNUSUAL VAGINAL DISCHARGE OR ITCHING   Items with * indicate a potential emergency and should be followed up as soon as possible or go to the Emergency Department if any problems should occur.  Please show the CHEMOTHERAPY ALERT CARD or IMMUNOTHERAPY ALERT CARD at  check-in to the Emergency Department and triage nurse.  Should you have questions after your visit or need to cancel or reschedule your appointment, please contact [facility]  [phone] and follow the prompts.  Office hours are 8:00 a.m. to 4:30 p.m. Monday - Friday. Please note that voicemails left after 4:00 p.m. may not be returned until the following business day.  We are closed weekends and major holidays. You have access to a nurse at all times for urgent questions. Please call the main number to the clinic [phone] and follow the prompts.  For any non-urgent questions, you may also contact your provider using MyChart. We now offer e-Visits for anyone 8 and older to request care online for non-urgent symptoms. For details visit [email or web address].   Also download the MyChart app! Go to the app store, search "MyChart", open the app, select [facility], and log in with your MyChart username and password.  Due to Covid, a mask is required upon entering the hospital/clinic. If you do not have a mask, one will be given to you upon arrival. For doctor visits, patients may have 1 support person aged 50 or older with them. For treatment visits, patients cannot have anyone with them due to current Covid guidelines and our immunocompromised population. Carboplatin injection What is this medication? CARBOPLATIN (KAR boe pla tin) is a chemotherapy drug. It targets fast dividing cells, like cancer cells, and causes these cells to die. This medicine is used to treat ovarian cancer and many other cancers. This medicine may be used for other purposes; ask your health care provider or  pharmacist if you have questions. COMMON BRAND NAME(S): Paraplatin What should I tell my care team before I take this medication? They need to know if you have any of these conditions: blood disorders hearing problems kidney disease recent or ongoing radiation therapy an unusual  or allergic reaction to carboplatin, cisplatin, other chemotherapy, other medicines, foods, dyes, or preservatives pregnant or trying to get pregnant breast-feeding How should I use this medication? This drug is usually given as an infusion into a vein. It is administered in a hospital or clinic by a specially trained health care professional. Talk to your pediatrician regarding the use of this medicine in children. Special care may be needed. Overdosage: If you think you have taken too much of this medicine contact a poison control center or emergency room at once. NOTE: This medicine is only for you. Do not share this medicine with others. What if I miss a dose? It is important not to miss a dose. Call your doctor or health care professional if you are unable to keep an appointment. What may interact with this medication? medicines for seizures medicines to increase blood counts like filgrastim, pegfilgrastim, sargramostim some antibiotics like amikacin, gentamicin, neomycin, streptomycin, tobramycin vaccines Talk to your doctor or health care professional before taking any of these medicines: acetaminophen aspirin ibuprofen ketoprofen naproxen This list may not describe all possible interactions. Give your health care provider a list of all the medicines, herbs, non-prescription drugs, or dietary supplements you use. Also tell them if you smoke, drink alcohol, or use illegal drugs. Some items may interact with your medicine. What should I watch for while using this medication? Your condition will be monitored carefully while you are receiving this medicine. You will need important blood work done while you are taking this medicine. This drug may make you feel generally unwell. This is not uncommon, as chemotherapy can affect healthy cells as well as cancer cells. Report any side effects. Continue your course of treatment even though you feel ill unless your doctor tells you to stop. In  some cases, you may be given additional medicines to help with side effects. Follow all directions for their use. Call your doctor or health care professional for advice if you get a fever, chills or sore throat, or other symptoms of a cold or flu. Do not treat yourself. This drug decreases your body's ability to fight infections. Try to avoid being around people who are sick. This medicine may increase your risk to bruise or bleed. Call your doctor or health care professional if you notice any unusual bleeding. Be careful brushing and flossing your teeth or using a toothpick because you may get an infection or bleed more easily. If you have any dental work done, tell your dentist you are receiving this medicine. Avoid taking products that contain aspirin, acetaminophen, ibuprofen, naproxen, or ketoprofen unless instructed by your doctor. These medicines may hide a fever. Do not become pregnant while taking this medicine. Women should inform their doctor if they wish to become pregnant or think they might be pregnant. There is a potential for serious side effects to an unborn child. Talk to your health care professional or pharmacist for more information. Do not breast-feed an infant while taking this medicine. What side effects may I notice from receiving this medication? Side effects that you should report to your doctor or health care professional as soon as possible: allergic reactions like skin rash, itching or hives, swelling of the face, lips, or tongue  signs of infection - fever or chills, cough, sore throat, pain or difficulty passing urine signs of decreased platelets or bleeding - bruising, pinpoint red spots on the skin, black, tarry stools, nosebleeds signs of decreased red blood cells - unusually weak or tired, fainting spells, lightheadedness breathing problems changes in hearing changes in vision chest pain high blood pressure low blood counts - This drug may decrease the number of  white blood cells, red blood cells and platelets. You may be at increased risk for infections and bleeding. nausea and vomiting pain, swelling, redness or irritation at the injection site pain, tingling, numbness in the hands or feet problems with balance, talking, walking trouble passing urine or change in the amount of urine Side effects that usually do not require medical attention (report to your doctor or health care professional if they continue or are bothersome): hair loss loss of appetite metallic taste in the mouth or changes in taste This list may not describe all possible side effects. Call your doctor for medical advice about side effects. You may report side effects to FDA at 1-800-FDA-1088. Where should I keep my medication? This drug is given in a hospital or clinic and will not be stored at home. NOTE: This sheet is a summary. It may not cover all possible information. If you have questions about this medicine, talk to your doctor, pharmacist, or health care provider.  2022 Elsevier/Gold Standard (2007-12-26 14:38:05) Paclitaxel injection What is this medication? PACLITAXEL (PAK li TAX el) is a chemotherapy drug. It targets fast dividing cells, like cancer cells, and causes these cells to die. This medicine is used to treat ovarian cancer, breast cancer, lung cancer, Kaposi's sarcoma, and other cancers. This medicine may be used for other purposes; ask your health care provider or pharmacist if you have questions. COMMON BRAND NAME(S): Onxol, Taxol What should I tell my care team before I take this medication? They need to know if you have any of these conditions: history of irregular heartbeat liver disease low blood counts, like low white cell, platelet, or red cell counts lung or breathing disease, like asthma tingling of the fingers or toes, or other nerve disorder an unusual or allergic reaction to paclitaxel, alcohol, polyoxyethylated castor oil, other chemotherapy,  other medicines, foods, dyes, or preservatives pregnant or trying to get pregnant breast-feeding How should I use this medication? This drug is given as an infusion into a vein. It is administered in a hospital or clinic by a specially trained health care professional. Talk to your pediatrician regarding the use of this medicine in children. Special care may be needed. Overdosage: If you think you have taken too much of this medicine contact a poison control center or emergency room at once. NOTE: This medicine is only for you. Do not share this medicine with others. What if I miss a dose? It is important not to miss your dose. Call your doctor or health care professional if you are unable to keep an appointment. What may interact with this medication? Do not take this medicine with any of the following medications: live virus vaccines This medicine may also interact with the following medications: antiviral medicines for hepatitis, HIV or AIDS certain antibiotics like erythromycin and clarithromycin certain medicines for fungal infections like ketoconazole and itraconazole certain medicines for seizures like carbamazepine, phenobarbital, phenytoin gemfibrozil nefazodone rifampin St. John's wort This list may not describe all possible interactions. Give your health care provider a list of all the medicines, herbs, non-prescription drugs,  or dietary supplements you use. Also tell them if you smoke, drink alcohol, or use illegal drugs. Some items may interact with your medicine. What should I watch for while using this medication? Your condition will be monitored carefully while you are receiving this medicine. You will need important blood work done while you are taking this medicine. This medicine can cause serious allergic reactions. To reduce your risk you will need to take other medicine(s) before treatment with this medicine. If you experience allergic reactions like skin rash, itching  or hives, swelling of the face, lips, or tongue, tell your doctor or health care professional right away. In some cases, you may be given additional medicines to help with side effects. Follow all directions for their use. This drug may make you feel generally unwell. This is not uncommon, as chemotherapy can affect healthy cells as well as cancer cells. Report any side effects. Continue your course of treatment even though you feel ill unless your doctor tells you to stop. Call your doctor or health care professional for advice if you get a fever, chills or sore throat, or other symptoms of a cold or flu. Do not treat yourself. This drug decreases your body's ability to fight infections. Try to avoid being around people who are sick. This medicine may increase your risk to bruise or bleed. Call your doctor or health care professional if you notice any unusual bleeding. Be careful brushing and flossing your teeth or using a toothpick because you may get an infection or bleed more easily. If you have any dental work done, tell your dentist you are receiving this medicine. Avoid taking products that contain aspirin, acetaminophen, ibuprofen, naproxen, or ketoprofen unless instructed by your doctor. These medicines may hide a fever. Do not become pregnant while taking this medicine. Women should inform their doctor if they wish to become pregnant or think they might be pregnant. There is a potential for serious side effects to an unborn child. Talk to your health care professional or pharmacist for more information. Do not breast-feed an infant while taking this medicine. Men are advised not to father a child while receiving this medicine. This product may contain alcohol. Ask your pharmacist or healthcare provider if this medicine contains alcohol. Be sure to tell all healthcare providers you are taking this medicine. Certain medicines, like metronidazole and disulfiram, can cause an unpleasant reaction when  taken with alcohol. The reaction includes flushing, headache, nausea, vomiting, sweating, and increased thirst. The reaction can last from 30 minutes to several hours. What side effects may I notice from receiving this medication? Side effects that you should report to your doctor or health care professional as soon as possible: allergic reactions like skin rash, itching or hives, swelling of the face, lips, or tongue breathing problems changes in vision fast, irregular heartbeat high or low blood pressure mouth sores pain, tingling, numbness in the hands or feet signs of decreased platelets or bleeding - bruising, pinpoint red spots on the skin, black, tarry stools, blood in the urine signs of decreased red blood cells - unusually weak or tired, feeling faint or lightheaded, falls signs of infection - fever or chills, cough, sore throat, pain or difficulty passing urine signs and symptoms of liver injury like dark yellow or brown urine; general ill feeling or flu-like symptoms; light-colored stools; loss of appetite; nausea; right upper belly pain; unusually weak or tired; yellowing of the eyes or skin swelling of the ankles, feet, hands unusually slow heartbeat  Side effects that usually do not require medical attention (report to your doctor or health care professional if they continue or are bothersome): diarrhea hair loss loss of appetite muscle or joint pain nausea, vomiting pain, redness, or irritation at site where injected tiredness This list may not describe all possible side effects. Call your doctor for medical advice about side effects. You may report side effects to FDA at 1-800-FDA-1088. Where should I keep my medication? This drug is given in a hospital or clinic and will not be stored at home. NOTE: This sheet is a summary. It may not cover all possible information. If you have questions about this medicine, talk to your doctor, pharmacist, or health care provider.  2022  Elsevier/Gold Standard (2019-08-22 13:37:23)

## 2021-06-22 NOTE — Progress Notes (Signed)
On 9/19-patient had a reaction to Taxol-severe needing breathing treatment/oxygen supplementation.  Improved with albuterol; steroids/supportive care.  Will discontinue Taxol.  Proceed with carboplatin #2.   Recommend substituting Taxol with Abraxane. Discussed with pt/Rodney Vega.   GB

## 2021-06-23 ENCOUNTER — Encounter: Payer: Self-pay | Admitting: *Deleted

## 2021-06-23 ENCOUNTER — Ambulatory Visit
Admission: RE | Admit: 2021-06-23 | Discharge: 2021-06-23 | Disposition: A | Discharge status: Home / Self Care | Payer: Medicare PPO | Source: Ambulatory Visit | Attending: Radiation Oncology | Admitting: Radiation Oncology

## 2021-06-23 DIAGNOSIS — Z510 Encounter for antineoplastic radiation therapy: Secondary | ICD-10-CM | POA: Diagnosis not present

## 2021-06-23 NOTE — Progress Notes (Signed)
Met with patient during infusion on 06/22/21. All questions answered during visit. Pt did not have any barriers or needs at this time. Instructed to call with any questions or needs in the future. Pt verbalized understanding.

## 2021-06-24 ENCOUNTER — Inpatient Hospital Stay: Payer: Medicare PPO | Admitting: Occupational Therapy

## 2021-06-24 ENCOUNTER — Ambulatory Visit
Admission: RE | Admit: 2021-06-24 | Discharge: 2021-06-24 | Disposition: A | Discharge status: Home / Self Care | Payer: Medicare PPO | Source: Ambulatory Visit | Attending: Radiation Oncology | Admitting: Radiation Oncology

## 2021-06-24 ENCOUNTER — Inpatient Hospital Stay: Payer: Medicare PPO | Admitting: *Deleted

## 2021-06-24 DIAGNOSIS — M25611 Stiffness of right shoulder, not elsewhere classified: Secondary | ICD-10-CM

## 2021-06-24 DIAGNOSIS — Z510 Encounter for antineoplastic radiation therapy: Secondary | ICD-10-CM | POA: Diagnosis not present

## 2021-06-24 DIAGNOSIS — C3490 Malignant neoplasm of unspecified part of unspecified bronchus or lung: Secondary | ICD-10-CM

## 2021-06-24 NOTE — Progress Notes (Signed)
Multidisciplinary Oncology Council Documentation  Rodney Vega was presented by our [facility] on 06/24/2021, which included representatives from:  Palliative Care Dietitian  Physical/Occupational Therapist Nurse Navigator Genetics Speech Therapist Survivorship RN Research RN  Shandy currently presents with history of lung cancer  We reviewed previous medical and familial history, history of present illness, and recent lab results along with all available histopathologic and imaging studies. The [facility] considered available treatment options and made the following recommendations/referrals:  -No recommendations at this time.  The MOC is a meeting of clinicians from various specialty areas who evaluate and discuss patients for whom a multidisciplinary approach is being considered. Final determinations in the plan of care are those of the provider(s).   Today's extended care, comprehensive team conference, Nour was not present for the discussion and was not examined.

## 2021-06-24 NOTE — Therapy (Signed)
[facility] [address] [place], [place], [place], [address] Phone: [phone]   Fax:  [phone]  Occupational Therapy Screen:  Patient Details  Name: Rodney Vega MRN: [id number] Date of Birth: March 20, 1956 No data recorded  Encounter Date: 06/24/2021   OT End of Session - 06/24/21 1127     Visit Number 0             Past Medical History:  Diagnosis Date   [facility]cer [facility])    Degenerative arthritis of knee    Depression    Diverticulitis    Glaucoma    since 2004   Hernia 1979   History of kidney stones    Hypertension    Personal history of tobacco use, presenting hazards to health    Pre-diabetes    Screening for obesity    Special screening for malignant neoplasms, colon    Wears dentures    full upper and lower    Past Surgical History:  Procedure Laterality Date   [place], 2012   lumbar bulging disc   CATARACT EXTRACTION W/PHACO Left 01/20/2021   Procedure: CATARACT EXTRACTION PHACO AND INTRAOCULAR LENS PLACEMENT ([place]) LEFT;  Surgeon: Birder Robson, MD;  Location: [facility];  Service: Ophthalmology;  Laterality: Left;  10.13 0:52.1   COLONOSCOPY  5009,3818   [facility]/Dr. Buelah Manis sessile polyp,16mm, found in rectum,multiple diverticula were found in the sigmoid, in descending and in transverse colon   COLOSTOMY  04-20-13   HERNIA REPAIR  1979   inguinal   IR IMAGING GUIDED PORT INSERTION  06/10/2021   JOINT REPLACEMENT Right    knee   KNEE ARTHROPLASTY Right 05/08/2018   Procedure: COMPUTER ASSISTED TOTAL KNEE ARTHROPLASTY;  Surgeon: Dereck Leep, MD;  Location: [facility];  Service: Orthopedics;  Laterality: Right;   KNEE ARTHROPLASTY Left 11/16/2019   Procedure: COMPUTER ASSISTED TOTAL KNEE ARTHROPLASTY;  Surgeon: Dereck Leep, MD;  Location: [facility];  Service: Orthopedics;  Laterality: Left;   LAPAROTOMY  04-20-13   colon resection with colosotmy   LITHOTRIPSY  2013    [place]    There were no vitals filed for this visit.   Subjective Assessment - 06/24/21 1124     Subjective  I had since July 4th - some pain with my R shoulder and on my side - then after that was diagnosis with cancer - today is my 8th radiation treatment- and has chemo every week - but since Sunday my pain on my R side and arm is better- I did fell on that shoulder years ago    Currently in Pain? No/denies                Dr Rogue Bussing note from 06/15/21: ASSESSMENT & PLAN:    Primary cancer of right upper lobe of lung ([place]) #Right upper lobe lung mass -concerning for malignancy.  8.2 x 5.9 cm -extends from the right hilum to the lateral chest wall and results in lytic destruction of the lateral portion of the right fourth rib; also noted to have mediastinal adenopathy/hilar lymph nodes; and a 3 cm adrenal mass on the right. SEP 2022 MRI of the brain-WNL.  August 2022-PET scan above. lung findings; adrenal lesion non-avid.Biopsy unfortunately not definitive-however atypical cells suggestive of malignancy noted-no necrosis.  Reviewed at tumor conference.    # Proceed with carbo-taxol with RT [last RT 10/28]; Labs today reviewed;  acceptable for treatment today.    #  Right chest wall pain-clinically stable on Tylenol/tramadol [Qhs-2; 5-6 tramadol a day]   # COPD- STABLE; albuterol.   #Smoking: Counseled regarding quitting smoking; recommend putting the patch on.    #Prediabetes: FBS: 171- We need to monitor blood sugar levels on therapies.   #IV mediport: functioning/STABLE.  tati # DISPOSITION: # Chemo today # 1 week- labs- cbc/cmo;carbo-taxol # follow up in 2 weeks- , MD- labs- cbc/cmp;carbo-taxol-Dr.B    OT SCREEN 06/24/21: Pt  report he was worried about not having his R shoulder get more stiff- but since this weekend his shoulder and pain in his side better. AROM for bilateral shoulder flexion, ABD and ext WNL- strength 5/5 EXT and INT  rotations WNL  Pt active at home - with puppy, gardening and yard work. Denies any falls  No need for OT at this time - pt and wife to contact me if any issues occur in future with fatigue, balance , stiffness or shoulder pain - or any other rehab needs                                      Patient will benefit from skilled therapeutic intervention in order to improve the following deficits and impairments:           Visit Diagnosis: Stiffness of right shoulder, not elsewhere classified    Problem List Patient Active Problem List   Diagnosis Date Noted   Primary cancer of right upper lobe of lung ([place]) 06/03/2021   Mass of upper lobe of right lung 05/20/2021   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

## 2021-06-25 ENCOUNTER — Other Ambulatory Visit: Payer: Self-pay | Admitting: *Deleted

## 2021-06-25 ENCOUNTER — Ambulatory Visit
Admission: RE | Admit: 2021-06-25 | Discharge: 2021-06-25 | Disposition: A | Discharge status: Home / Self Care | Payer: Medicare PPO | Source: Ambulatory Visit | Attending: Radiation Oncology | Admitting: Radiation Oncology

## 2021-06-25 ENCOUNTER — Other Ambulatory Visit: Payer: Self-pay | Admitting: Internal Medicine

## 2021-06-25 DIAGNOSIS — C3411 Malignant neoplasm of upper lobe, right bronchus or lung: Secondary | ICD-10-CM

## 2021-06-25 DIAGNOSIS — Z510 Encounter for antineoplastic radiation therapy: Secondary | ICD-10-CM | POA: Diagnosis not present

## 2021-06-25 NOTE — Telephone Encounter (Signed)
Pt requesting refill of tramadol.

## 2021-06-26 ENCOUNTER — Ambulatory Visit
Admission: RE | Admit: 2021-06-26 | Discharge: 2021-06-26 | Disposition: A | Discharge status: Home / Self Care | Payer: Medicare PPO | Source: Ambulatory Visit | Attending: Radiation Oncology | Admitting: Radiation Oncology

## 2021-06-26 DIAGNOSIS — Z510 Encounter for antineoplastic radiation therapy: Secondary | ICD-10-CM | POA: Diagnosis not present

## 2021-06-29 ENCOUNTER — Inpatient Hospital Stay: Payer: Medicare PPO

## 2021-06-29 ENCOUNTER — Ambulatory Visit
Admission: RE | Admit: 2021-06-29 | Discharge: 2021-06-29 | Disposition: A | Discharge status: Home / Self Care | Payer: Medicare PPO | Source: Ambulatory Visit | Attending: Radiation Oncology | Admitting: Radiation Oncology

## 2021-06-29 ENCOUNTER — Inpatient Hospital Stay (HOSPITAL_BASED_OUTPATIENT_CLINIC_OR_DEPARTMENT_OTHER): Payer: Medicare PPO | Admitting: Internal Medicine

## 2021-06-29 VITALS — BP 137/66 | HR 96 | Temp 98.0°F | Resp 18 | Wt 197.1 lb

## 2021-06-29 DIAGNOSIS — E1369 Other specified diabetes mellitus with other specified complication: Secondary | ICD-10-CM | POA: Diagnosis not present

## 2021-06-29 DIAGNOSIS — C3411 Malignant neoplasm of upper lobe, right bronchus or lung: Secondary | ICD-10-CM

## 2021-06-29 DIAGNOSIS — C3490 Malignant neoplasm of unspecified part of unspecified bronchus or lung: Secondary | ICD-10-CM

## 2021-06-29 DIAGNOSIS — Z510 Encounter for antineoplastic radiation therapy: Secondary | ICD-10-CM | POA: Diagnosis not present

## 2021-06-29 LAB — COMPREHENSIVE METABOLIC PANEL
ALT: 15 U/L (ref 0–44)
AST: 18 U/L (ref 15–41)
Albumin: 3.7 g/dL (ref 3.5–5.0)
Alkaline Phosphatase: 117 U/L (ref 38–126)
Anion gap: 9 (ref 5–15)
BUN: 17 mg/dL (ref 8–23)
CO2: 26 mmol/L (ref 22–32)
Calcium: 9.4 mg/dL (ref 8.9–10.3)
Chloride: 98 mmol/L (ref 98–111)
Creatinine, Ser: 0.94 mg/dL (ref 0.61–1.24)
GFR, Estimated: >60 mL/min (ref >60)
Glucose, Bld: 174 mg/dL — ABNORMAL HIGH (ref 70–99)
Potassium: 4.1 mmol/L (ref 3.5–5.1)
Sodium: 133 mmol/L — ABNORMAL LOW (ref 135–145)
Total Bilirubin: 0.4 mg/dL (ref 0.3–1.2)
Total Protein: 7.1 g/dL (ref 6.5–8.1)

## 2021-06-29 LAB — CBC WITH DIFFERENTIAL/PLATELET
Abs Immature Granulocytes: 0.04 10*3/uL (ref 0.00–0.07)
Basophils Absolute: 0.1 10*3/uL (ref 0.0–0.1)
Basophils Relative: 1 %
Eosinophils Absolute: 0.1 10*3/uL (ref 0.0–0.5)
Eosinophils Relative: 1 %
HCT: 38.2 % — ABNORMAL LOW (ref 39.0–52.0)
Hemoglobin: 12.9 g/dL — ABNORMAL LOW (ref 13.0–17.0)
Immature Granulocytes: 1 %
Lymphocytes Relative: 13 %
Lymphs Abs: 1 10*3/uL (ref 0.7–4.0)
MCH: 30.4 pg (ref 26.0–34.0)
MCHC: 33.8 g/dL (ref 30.0–36.0)
MCV: 89.9 fL (ref 80.0–100.0)
Monocytes Absolute: 0.9 10*3/uL (ref 0.1–1.0)
Monocytes Relative: 11 %
Neutro Abs: 5.8 10*3/uL (ref 1.7–7.7)
Neutrophils Relative %: 73 %
Platelets: 232 10*3/uL (ref 150–400)
RBC: 4.25 MIL/uL (ref 4.22–5.81)
RDW: 13.3 % (ref 11.5–15.5)
WBC: 7.9 10*3/uL (ref 4.0–10.5)
nRBC: 0 % (ref 0.0–0.2)

## 2021-06-29 MED ORDER — BLOOD GLUCOSE MONITOR KIT
PACK | 0 refills | Status: DC
Start: 2021-06-29 — End: 2021-06-29

## 2021-06-29 MED ORDER — PALONOSETRON HCL INJECTION 0.25 MG/5ML
0.2500 mg | Freq: Once | INTRAVENOUS | Status: AC
  Administered 2021-06-29: 0.25 mg via INTRAVENOUS
  Filled 2021-06-29: qty 5

## 2021-06-29 MED ORDER — GLIPIZIDE 5 MG PO TABS: 5.0000 mg | ORAL_TABLET | Freq: Every day | ORAL | 1 refills | Status: DC

## 2021-06-29 MED ORDER — FREESTYLE LANCETS MISC: 12 refills | Status: DC

## 2021-06-29 MED ORDER — BLOOD GLUCOSE MONITOR KIT: PACK | 0 refills | Status: DC

## 2021-06-29 MED ORDER — SODIUM CHLORIDE 0.9% FLUSH
10.0000 mL | Freq: Once | INTRAVENOUS | Status: AC
  Administered 2021-06-29: 10 mL via INTRAVENOUS
  Filled 2021-06-29: qty 10

## 2021-06-29 MED ORDER — PACLITAXEL PROTEIN-BOUND CHEMO INJECTION 100 MG
25.0000 mg/m2 | Freq: Once | INTRAVENOUS | Status: AC
  Administered 2021-06-29: 50 mg via INTRAVENOUS
  Filled 2021-06-29: qty 10

## 2021-06-29 MED ORDER — GLUCOSE BLOOD VI STRP: ORAL_STRIP | 12 refills | Status: DC

## 2021-06-29 MED ORDER — SODIUM CHLORIDE 0.9 % IV SOLN
Freq: Once | INTRAVENOUS | Status: AC
  Filled 2021-06-29: qty 250

## 2021-06-29 MED ORDER — SODIUM CHLORIDE 0.9 % IV SOLN
10.0000 mg | Freq: Once | INTRAVENOUS | Status: AC
  Administered 2021-06-29: 10 mg via INTRAVENOUS
  Filled 2021-06-29: qty 10

## 2021-06-29 MED ORDER — FAMOTIDINE 20 MG IN NS 100 ML IVPB: 20.0000 mg | Freq: Once | INTRAVENOUS | Status: DC

## 2021-06-29 MED ORDER — DIPHENHYDRAMINE HCL 50 MG/ML IJ SOLN
50.0000 mg | Freq: Once | INTRAMUSCULAR | Status: DC
  Filled 2021-06-29: qty 1

## 2021-06-29 MED ORDER — SODIUM CHLORIDE 0.9 % IV SOLN
230.0000 mg | Freq: Once | INTRAVENOUS | Status: AC
  Administered 2021-06-29: 230 mg via INTRAVENOUS
  Filled 2021-06-29: qty 23

## 2021-06-29 MED ORDER — HEPARIN SOD (PORK) LOCK FLUSH 100 UNIT/ML IV SOLN
INTRAVENOUS | Status: AC
  Administered 2021-06-29: 500 [IU]
  Filled 2021-06-29: qty 5

## 2021-06-29 MED ORDER — HEPARIN SOD (PORK) LOCK FLUSH 100 UNIT/ML IV SOLN
500.0000 [IU] | Freq: Once | INTRAVENOUS | Status: AC | PRN
  Filled 2021-06-29: qty 5

## 2021-06-29 NOTE — Patient Instructions (Signed)
[facility]  Discharge Instructions: Thank you for choosing [facility] to provide your oncology and hematology care.  If you have a lab appointment with the [facility], please go directly to the [facility] and check in at the registration area.  Wear comfortable clothing and clothing appropriate for easy access to any Portacath or PICC line.   We strive to give you quality time with your provider. You may need to reschedule your appointment if you arrive late (15 or more minutes).  Arriving late affects you and other patients whose appointments are after yours.  Also, if you miss three or more appointments without notifying the office, you may be dismissed from the clinic at the provider's discretion.      For prescription refill requests, have your pharmacy contact our office and allow 72 hours for refills to be completed.    Today you received the following chemotherapy and/or immunotherapy agents Abraxane & carboplatin   Carboplatin injection What is this medication? CARBOPLATIN (KAR boe pla tin) is a chemotherapy drug. It targets fast dividing cells, like cancer cells, and causes these cells to die. This medicine is used to treat ovarian cancer and many other cancers. This medicine may be used for other purposes; ask your health care provider or pharmacist if you have questions. COMMON BRAND NAME(S): Paraplatin What should I tell my care team before I take this medication? They need to know if you have any of these conditions: blood disorders hearing problems kidney disease recent or ongoing radiation therapy an unusual or allergic reaction to carboplatin, cisplatin, other chemotherapy, other medicines, foods, dyes, or preservatives pregnant or trying to get pregnant breast-feeding How should I use this medication? This drug is usually given as an infusion into a vein. It is administered in a hospital or clinic by a specially  trained health care professional. Talk to your pediatrician regarding the use of this medicine in children. Special care may be needed. Overdosage: If you think you have taken too much of this medicine contact a poison control center or emergency room at once. NOTE: This medicine is only for you. Do not share this medicine with others. What if I miss a dose? It is important not to miss a dose. Call your doctor or health care professional if you are unable to keep an appointment. What may interact with this medication? medicines for seizures medicines to increase blood counts like filgrastim, pegfilgrastim, sargramostim some antibiotics like amikacin, gentamicin, neomycin, streptomycin, tobramycin vaccines Talk to your doctor or health care professional before taking any of these medicines: acetaminophen aspirin ibuprofen ketoprofen naproxen This list may not describe all possible interactions. Give your health care provider a list of all the medicines, herbs, non-prescription drugs, or dietary supplements you use. Also tell them if you smoke, drink alcohol, or use illegal drugs. Some items may interact with your medicine. What should I watch for while using this medication? Your condition will be monitored carefully while you are receiving this medicine. You will need important blood work done while you are taking this medicine. This drug may make you feel generally unwell. This is not uncommon, as chemotherapy can affect healthy cells as well as cancer cells. Report any side effects. Continue your course of treatment even though you feel ill unless your doctor tells you to stop. In some cases, you may be given additional medicines to help with side effects. Follow all directions for their use. Call your doctor or  health care professional for advice if you get a fever, chills or sore throat, or other symptoms of a cold or flu. Do not treat yourself. This drug decreases your body's ability to  fight infections. Try to avoid being around people who are sick. This medicine may increase your risk to bruise or bleed. Call your doctor or health care professional if you notice any unusual bleeding. Be careful brushing and flossing your teeth or using a toothpick because you may get an infection or bleed more easily. If you have any dental work done, tell your dentist you are receiving this medicine. Avoid taking products that contain aspirin, acetaminophen, ibuprofen, naproxen, or ketoprofen unless instructed by your doctor. These medicines may hide a fever. Do not become pregnant while taking this medicine. Women should inform their doctor if they wish to become pregnant or think they might be pregnant. There is a potential for serious side effects to an unborn child. Talk to your health care professional or pharmacist for more information. Do not breast-feed an infant while taking this medicine. What side effects may I notice from receiving this medication? Side effects that you should report to your doctor or health care professional as soon as possible: allergic reactions like skin rash, itching or hives, swelling of the face, lips, or tongue signs of infection - fever or chills, cough, sore throat, pain or difficulty passing urine signs of decreased platelets or bleeding - bruising, pinpoint red spots on the skin, black, tarry stools, nosebleeds signs of decreased red blood cells - unusually weak or tired, fainting spells, lightheadedness breathing problems changes in hearing changes in vision chest pain high blood pressure low blood counts - This drug may decrease the number of white blood cells, red blood cells and platelets. You may be at increased risk for infections and bleeding. nausea and vomiting pain, swelling, redness or irritation at the injection site pain, tingling, numbness in the hands or feet problems with balance, talking, walking trouble passing urine or change in the  amount of urine Side effects that usually do not require medical attention (report to your doctor or health care professional if they continue or are bothersome): hair loss loss of appetite metallic taste in the mouth or changes in taste This list may not describe all possible side effects. Call your doctor for medical advice about side effects. You may report side effects to FDA at 1-800-FDA-1088. Where should I keep my medication? This drug is given in a hospital or clinic and will not be stored at home. NOTE: This sheet is a summary. It may not cover all possible information. If you have questions about this medicine, talk to your doctor, pharmacist, or health care provider.  2022 Elsevier/Gold Standard (2007-12-26 14:38:05) Nanoparticle Albumin-Bound Paclitaxel injection What is this medication? NANOPARTICLE ALBUMIN-BOUND PACLITAXEL (Na no PAHR ti kuhl al BYOO muhn-bound PAK li TAX el) is a chemotherapy drug. It targets fast dividing cells, like cancer cells, and causes these cells to die. This medicine is used to treat advanced breast cancer, lung cancer, and pancreatic cancer. This medicine may be used for other purposes; ask your health care provider or pharmacist if you have questions. COMMON BRAND NAME(S): Abraxane What should I tell my care team before I take this medication? They need to know if you have any of these conditions: kidney disease liver disease low blood counts, like low white cell, platelet, or red cell counts lung or breathing disease, like asthma tingling of the fingers or toes,  or other nerve disorder an unusual or allergic reaction to paclitaxel, albumin, other chemotherapy, other medicines, foods, dyes, or preservatives pregnant or trying to get pregnant breast-feeding How should I use this medication? This drug is given as an infusion into a vein. It is administered in a hospital or clinic by a specially trained health care professional. Talk to your  pediatrician regarding the use of this medicine in children. Special care may be needed. Overdosage: If you think you have taken too much of this medicine contact a poison control center or emergency room at once. NOTE: This medicine is only for you. Do not share this medicine with others. What if I miss a dose? It is important not to miss your dose. Call your doctor or health care professional if you are unable to keep an appointment. What may interact with this medication? This medicine may interact with the following medications: antiviral medicines for hepatitis, HIV or AIDS certain antibiotics like erythromycin and clarithromycin certain medicines for fungal infections like ketoconazole and itraconazole certain medicines for seizures like carbamazepine, phenobarbital, phenytoin gemfibrozil nefazodone rifampin St. John's wort This list may not describe all possible interactions. Give your health care provider a list of all the medicines, herbs, non-prescription drugs, or dietary supplements you use. Also tell them if you smoke, drink alcohol, or use illegal drugs. Some items may interact with your medicine. What should I watch for while using this medication? Your condition will be monitored carefully while you are receiving this medicine. You will need important blood work done while you are taking this medicine. This medicine can cause serious allergic reactions. If you experience allergic reactions like skin rash, itching or hives, swelling of the face, lips, or tongue, tell your doctor or health care professional right away. In some cases, you may be given additional medicines to help with side effects. Follow all directions for their use. This drug may make you feel generally unwell. This is not uncommon, as chemotherapy can affect healthy cells as well as cancer cells. Report any side effects. Continue your course of treatment even though you feel ill unless your doctor tells you to  stop. Call your doctor or health care professional for advice if you get a fever, chills or sore throat, or other symptoms of a cold or flu. Do not treat yourself. This drug decreases your body's ability to fight infections. Try to avoid being around people who are sick. This medicine may increase your risk to bruise or bleed. Call your doctor or health care professional if you notice any unusual bleeding. Be careful brushing and flossing your teeth or using a toothpick because you may get an infection or bleed more easily. If you have any dental work done, tell your dentist you are receiving this medicine. Avoid taking products that contain aspirin, acetaminophen, ibuprofen, naproxen, or ketoprofen unless instructed by your doctor. These medicines may hide a fever. Do not become pregnant while taking this medicine or for 6 months after stopping it. Women should inform their doctor if they wish to become pregnant or think they might be pregnant. Men should not father a child while taking this medicine or for 3 months after stopping it. There is a potential for serious side effects to an unborn child. Talk to your health care professional or pharmacist for more information. Do not breast-feed an infant while taking this medicine or for 2 weeks after stopping it. This medicine may interfere with the ability to get pregnant or to father  a child. You should talk to your doctor or health care professional if you are concerned about your fertility. What side effects may I notice from receiving this medication? Side effects that you should report to your doctor or health care professional as soon as possible: allergic reactions like skin rash, itching or hives, swelling of the face, lips, or tongue breathing problems changes in vision fast, irregular heartbeat low blood pressure mouth sores pain, tingling, numbness in the hands or feet signs of decreased platelets or bleeding - bruising, pinpoint red spots  on the skin, black, tarry stools, blood in the urine signs of decreased red blood cells - unusually weak or tired, feeling faint or lightheaded, falls signs of infection - fever or chills, cough, sore throat, pain or difficulty passing urine signs and symptoms of liver injury like dark yellow or brown urine; general ill feeling or flu-like symptoms; light-colored stools; loss of appetite; nausea; right upper belly pain; unusually weak or tired; yellowing of the eyes or skin swelling of the ankles, feet, hands unusually slow heartbeat Side effects that usually do not require medical attention (report to your doctor or health care professional if they continue or are bothersome): diarrhea hair loss loss of appetite nausea, vomiting tiredness This list may not describe all possible side effects. Call your doctor for medical advice about side effects. You may report side effects to FDA at 1-800-FDA-1088. Where should I keep my medication? This drug is given in a hospital or clinic and will not be stored at home. NOTE: This sheet is a summary. It may not cover all possible information. If you have questions about this medicine, talk to your doctor, pharmacist, or health care provider.  2022 Elsevier/Gold Standard (2017-05-24 13:03:45)    To help prevent nausea and vomiting after your treatment, we encourage you to take your nausea medication as directed.  BELOW ARE SYMPTOMS THAT SHOULD BE REPORTED IMMEDIATELY: *FEVER GREATER THAN 100.4 F (38 C) OR HIGHER *CHILLS OR SWEATING *NAUSEA AND VOMITING THAT IS NOT CONTROLLED WITH YOUR NAUSEA MEDICATION *UNUSUAL SHORTNESS OF BREATH *UNUSUAL BRUISING OR BLEEDING *URINARY PROBLEMS (pain or burning when urinating, or frequent urination) *BOWEL PROBLEMS (unusual diarrhea, constipation, pain near the anus) TENDERNESS IN MOUTH AND THROAT WITH OR WITHOUT PRESENCE OF ULCERS (sore throat, sores in mouth, or a toothache) UNUSUAL RASH, SWELLING OR PAIN   UNUSUAL VAGINAL DISCHARGE OR ITCHING   Items with * indicate a potential emergency and should be followed up as soon as possible or go to the Emergency Department if any problems should occur.  Please show the CHEMOTHERAPY ALERT CARD or IMMUNOTHERAPY ALERT CARD at check-in to the Emergency Department and triage nurse.  Should you have questions after your visit or need to cancel or reschedule your appointment, please contact [facility]  [phone] and follow the prompts.  Office hours are 8:00 a.m. to 4:30 p.m. Monday - Friday. Please note that voicemails left after 4:00 p.m. may not be returned until the following business day.  We are closed weekends and major holidays. You have access to a nurse at all times for urgent questions. Please call the main number to the clinic [phone] and follow the prompts.  For any non-urgent questions, you may also contact your provider using MyChart. We now offer e-Visits for anyone 35 and older to request care online for non-urgent symptoms. For details visit [email or web address].   Also download the MyChart app! Go to the app store, search "MyChart", open the  app, select [facility], and log in with your MyChart username and password.  Due to Covid, a mask is required upon entering the hospital/clinic. If you do not have a mask, one will be given to you upon arrival. For doctor visits, patients may have 1 support person aged 28 or older with them. For treatment visits, patients cannot have anyone with them due to current Covid guidelines and our immunocompromised population.

## 2021-06-29 NOTE — Patient Instructions (Signed)
#  Sent prescription for glipizide for diabetes.  Take medication as recommended.  Watch your diet-cut down sugars.   #Check your blood glucose levels twice a day; once in the morning before breakfast; and once in the evening after dinner.  Call us if blood sugars greater than 300 -350 consistently or blood sugars less than 70-80.

## 2021-06-29 NOTE — Progress Notes (Signed)
[place] NOTE  Patient Care Team: [name], [name], [name], [name]    Oncology History Overview Note  AUG 2022- 8.2 x 5.9 cm spiculated mass noted in right upper lobe consistent with malignancy. It extends from the right hilum to the lateral chest wall and results in lytic destruction of the lateral portion of the right fourth rib.  Mediastinal lymph nodes are noted, including 12 mm right hilar lymph node, consistent with metastatic disease. 3 cm right adrenal mass is noted concerning for metastatic disease.  # AUG 2022- PET IMPRESSION: Peripherally hypermetabolic right upper lobe mass, likely due to central necrosis. Mass extends into the right hilum and right lateral chest wall involving the second through fourth right ribs.   Mildly enlarged and hypermetabolic right hilar lymph node.   No evidence of metastatic disease in the abdomen or pelvis.  # AUG, 2022-RIGHT CHEST WALL Bx-necrosis; suspicious for malignancy not definitive [discussed with Dr.Kraynie;MDT] LUNG MASS, RIGHT; BIOPSY:  - SUSPICIOUS FOR MALIGNANCY.  - SEE COMMENT.   Comment:  Approximately six tissue cores demonstrate inflamed fibrous capsule with  hemosiderin-laden macrophages, in a background of abundant necrosis.  One of the cores demonstrates a minute fragment of viable epithelial  cells.  These cells are positive for p40.  They are negative for TTF1  and CK7. The cells of interest disappear on deeper cut levels.  While  the findings are suspicious for squamous cell carcinoma, there is not  enough viable tumor for a definitive diagnosis.  Additional tissue  sampling may be helpful.   # SEP 2022-cycle #2 Taxol hypersensitive reaction.  Switched over to #3 cycle- carbo-Abraxane starting  06/29/2021   Primary cancer of right upper lobe of lung ([place])  06/03/2021 Initial Diagnosis   Primary cancer of right upper lobe of lung ([place])   06/03/2021 Cancer Staging   Staging form: Lung, AJCC 8th Edition - Clinical: Stage IIIB (cT3, cN2, cM0) - Signed by [name], MD on 06/03/2021   06/15/2021 -  Chemotherapy   Patient is on Treatment Plan : LUNG Carboplatin / Paclitaxel + XRT q7d        HISTORY OF PRESENTING ILLNESS: Ambulating independently.  Accompanied by wife.  Rodney Vega 65 y.o.  male history of smoking -"lung cancer" [Limited necrotic tissue]-stage III unresectable currently on chemoradiation- carbo-Taxane.   Patient had reaction to Taxol with cycle #2.  Taxol has been discontinued.  Patient continues to have right chest wall pain-fairly controlled with tramadol/Tylenol.  Denies any nausea vomiting headaches.  Review of Systems  Constitutional:  Positive for malaise/fatigue. Negative for chills, diaphoresis, fever and weight loss.  HENT:  Negative for nosebleeds and sore throat.   Eyes:  Negative for double vision.  Respiratory:  Positive for shortness of breath. Negative for cough, hemoptysis, sputum production and wheezing.   Cardiovascular:  Negative for chest pain, palpitations, orthopnea and leg swelling.  Gastrointestinal:  Positive for nausea. Negative for abdominal pain, blood in stool, constipation, diarrhea, heartburn, melena and vomiting.  Genitourinary:  Negative for dysuria, frequency and urgency.  Musculoskeletal:  Negative for back pain and joint pain.  Skin: Negative.  Negative for itching and rash.  Neurological:  Positive for tingling. Negative for dizziness, focal weakness, weakness and headaches.  Endo/Heme/Allergies:  Does not bruise/bleed easily.  Psychiatric/Behavioral:  Negative for depression. The  patient is not nervous/anxious and does not have insomnia.     MEDICAL HISTORY:  Past Medical History:  Diagnosis Date   Cancer [facility])     Degenerative arthritis of knee    Depression    Diverticulitis    Glaucoma    since 2004   Hernia 1979   History of kidney stones    Hypertension    Personal history of tobacco use, presenting hazards to health    Pre-diabetes    Screening for obesity    Special screening for malignant neoplasms, colon    Wears dentures    full upper and lower    SURGICAL HISTORY: Past Surgical History:  Procedure Laterality Date   [place], 2012   lumbar bulging disc   CATARACT EXTRACTION W/PHACO Left 01/20/2021   Procedure: CATARACT EXTRACTION PHACO AND INTRAOCULAR LENS PLACEMENT ([place]) LEFT;  Surgeon: Birder Robson, MD;  Location: [place];  Service: Ophthalmology;  Laterality: Left;  10.13 0:52.1   COLONOSCOPY  6213,[id number]   [facility]/Dr. Buelah Manis sessile polyp,64m, found in rectum,multiple diverticula were found in the sigmoid, in descending and in transverse colon   COLOSTOMY  04-20-13   HERNIA REPAIR  1979   inguinal   IR IMAGING GUIDED PORT INSERTION  06/10/2021   JOINT REPLACEMENT Right    knee   KNEE ARTHROPLASTY Right 05/08/2018   Procedure: COMPUTER ASSISTED TOTAL KNEE ARTHROPLASTY;  Surgeon: HDereck Leep MD;  Location: [facility] ORS;  Service: Orthopedics;  Laterality: Right;   KNEE ARTHROPLASTY Left 11/16/2019   Procedure: COMPUTER ASSISTED TOTAL KNEE ARTHROPLASTY;  Surgeon: HDereck Leep MD;  Location: [facility] OR[facility];  Service: Orthopedics;  Laterality: Left;   LAPAROTOMY  04-20-13   colon resection with colosotmy   LITHOTRIPSY  2013   TONSILLECTOMY AND ADENOIDECTOMY  1962    SOCIAL HISTORY: Social History   Socioeconomic History   Marital status: Single    Spouse name: Not on file   Number of children: Not on file   Years of education: Not on file   Highest education level: Not on file  Occupational History   Not on file  Tobacco Use   Smoking status: Every Day    Packs/day: 1.00    Years: 20.00    Pack years: 20.00    Types: Cigarettes   Smokeless  tobacco: Never   Tobacco comments:    since age [age] Vaping Use   Vaping Use: Never used  Substance and Sexual Activity   Alcohol use: Yes    Comment: occassional   Drug use: Yes    Types: Marijuana   Sexual activity: Not on file  Other Topics Concern   Not on file  Social History Narrative   15 mins /[place] smoking; not much alcohol. Worked for [employer] 2019. .Marland Kitchen   Social Determinants of Health   Financial Resource Strain: Not on file  Food Insecurity: Not on file  Transportation Needs: Not on file  Physical Activity: Not on file  Stress: Not on file  Social Connections: Not on file  Intimate Partner Violence: Not on file    FAMILY HISTORY: Family History  Problem Relation Age of Onset   Diabetes Mother    Heart disease Mother    Heart attack Father    Prostate cancer Father     ALLERGIES:  is allergic to paclitaxel, ambien [zolpidem], and aleve [naproxen sodium].  MEDICATIONS:  Current Outpatient Medications  Medication Sig Dispense Refill  acetaminophen (TYLENOL) 500 MG tablet Take 1,000 mg by mouth daily.     albuterol (VENTOLIN HFA) 108 (90 Base) MCG/ACT inhaler Inhale 2 puffs into the lungs every 6 (six) hours as needed for wheezing or shortness of breath. 1 each 2   cetirizine (ZYRTEC) 10 MG tablet Take 10 mg by mouth daily.     dexamethasone (DECADRON) 4 MG tablet Take 2 tablets (8 mg total) by mouth daily. Start the day after chemotherapy for 2 days. 30 tablet 1   diphenhydramine-acetaminophen (TYLENOL PM) 25-500 MG TABS Take 1-2 tablets by mouth at bedtime as needed (sleep.).      dorzolamide-timolol (COSOPT) 22.3-6.8 MG/ML ophthalmic solution Place 1 drop into both eyes 2 (two) times daily.     glipiZIDE (GLUCOTROL) 5 MG tablet Take 1 tablet (5 mg total) by mouth daily before breakfast. 30 tablet 1   guaiFENesin (MUCINEX) 600 MG 12 hr tablet Take by mouth 2 (two) times daily as needed.     latanoprost (XALATAN) 0.005 %  ophthalmic solution Place 1 drop into both eyes at bedtime.     lidocaine-prilocaine (EMLA) cream Apply to affected area once 30 g 3   nicotine (NICODERM CQ - DOSED IN MG/24 HOURS) 21 mg/24hr patch Place 1 patch (21 mg total) onto the skin daily. 28 patch 1   olmesartan (BENICAR) 40 MG tablet Take 40 mg by mouth daily.     ondansetron (ZOFRAN) 8 MG tablet Take 1 tablet (8 mg total) by mouth 2 (two) times daily as needed for refractory nausea / vomiting. Start on day 3 after chemo. 30 tablet 1   prochlorperazine (COMPAZINE) 10 MG tablet TAKE 1 TABLET(10 MG) BY MOUTH EVERY 6 HOURS AS NEEDED FOR NAUSEA OR VOMITING 30 tablet 1   traMADol (ULTRAM) 50 MG tablet TAKE 1 TABLET(50 MG) BY MOUTH EVERY 8 HOURS AS NEEDED 60 tablet 0   traZODone (DESYREL) 50 MG tablet Take 1 tablet (50 mg total) by mouth at bedtime as needed for sleep. 30 tablet 1   venlafaxine XR (EFFEXOR-XR) 75 MG 24 hr capsule Take 225 mg by mouth daily.     blood glucose meter kit and supplies KIT Check your blood glucose levels twice a day; once in the morning before breakfast; and once in the evening after dinner. 1 each 0   Cyanocobalamin (VITAMIN B-12 PO) Take by mouth daily. (Patient not taking: No sig reported)     glucose blood test strip Check your blood glucose levels twice a day; once in the morning before breakfast; and once in the evening after dinner. 100 each 12   Lancets (FREESTYLE) lancets Check your blood glucose levels twice a day; once in the morning before breakfast; and once in the evening after dinner. 100 each 12   No current facility-administered medications for this visit.      Marland Kitchen  PHYSICAL EXAMINATION: ECOG PERFORMANCE STATUS: 1 - Symptomatic but completely ambulatory  Vitals:   06/29/21 0906  BP: 137/66  Pulse: 96  Resp: 18  Temp: 98 F (36.7 C)  SpO2: 99%    Filed Weights   06/29/21 0906  Weight: 197 lb 2 oz (89.4 kg)     Physical Exam Vitals and nursing note reviewed.  HENT:     Head:  Normocephalic and atraumatic.     Mouth/Throat:     Pharynx: Oropharynx is clear.  Eyes:     Extraocular Movements: Extraocular movements intact.     Pupils: Pupils are equal, round, and reactive to  light.  Cardiovascular:     Rate and Rhythm: Normal rate and regular rhythm.  Pulmonary:     Comments: Decreased breath sounds bilaterally.  Abdominal:     Palpations: Abdomen is soft.  Musculoskeletal:        General: Normal range of motion.     Cervical back: Normal range of motion.  Skin:    General: Skin is warm.  Neurological:     General: No focal deficit present.     Mental Status: He is alert and oriented to person, place, and time.  Psychiatric:        Behavior: Behavior normal.        Judgment: Judgment normal.     LABORATORY DATA:  I have reviewed the data as listed Lab Results  Component Value Date   WBC 7.9 06/29/2021   HGB 12.9 (L) 06/29/2021   HCT 38.2 (L) 06/29/2021   MCV 89.9 06/29/2021   PLT 232 06/29/2021   Recent Labs    06/15/21 0825 06/22/21 1059 06/29/21 0850  NA 135 134* 133*  K 3.6 3.6 4.1  CL 100 98 98  CO2 _0 GLUCOSE 171* 252* 174*  BUN _1 CREATININE 1.06 1.00 0.94  CALCIUM 8.9 9.0 9.4  GFRNONAA >60 >60 >60  PROT 7.3 7.4 7.1  ALBUMIN 3.9 3.9 3.7  AST _2 ALT _3 ALKPHOS 109 95 117  BILITOT 0.8 0.5 0.4    RADIOGRAPHIC STUDIES: I have personally reviewed the radiological images as listed and agreed with the findings in the report. MR Brain W Wo Contrast  Result Date: 06/13/2021 CLINICAL DATA:  Non-small cell lung cancer, staging EXAM: MRI HEAD WITHOUT AND WITH CONTRAST TECHNIQUE: Multiplanar, multiecho pulse sequences of the brain and surrounding structures were obtained without and with intravenous contrast. CONTRAST:  15m GADAVIST GADOBUTROL 1 MMOL/ML IV SOLN COMPARISON:  None. FINDINGS: Brain: No acute infarction, hemorrhage, hydrocephalus, extra-axial collection or mass lesion. Mild scattered T2  hyperintensities within the white matter, nonspecific but compatible with chronic microvascular ischemic disease. Vascular: Major arterial flow voids are maintained at the skull base. Skull and upper cervical spine: Normal marrow signal. Sinuses/Orbits: Very small air-fluid level in left maxillary sinus. No acute orbital abnormality. Other: No mastoid effusions. IMPRESSION: 1. No evidence of acute intracranial abnormality or metastatic disease. 2. Mild chronic microvascular ischemic disease. Electronically Signed   By: FMargaretha SheffieldM.D.   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 Port-A-Cath placement requested for chemotherapy administration. EXAM: IMPLANTED PORT A CATH PLACEMENT WITH ULTRASOUND AND FLUOROSCOPIC GUIDANCE ANESTHESIA/SEDATION: 2.0 mg IV Versed; 100 mcg IV Fentanyl Total Moderate Sedation Time:  44 minutes The patient's level of consciousness and physiologic status were continuously monitored during the procedure by Radiology nursing. FLUOROSCOPY TIME:  30 seconds.  3.4 mGy. PROCEDURE: The procedure, risks, benefits, and alternatives were explained to the patient. Questions regarding the procedure were encouraged and answered. The patient understands and consents to the procedure. A time-out was performed prior to initiating the procedure. Ultrasound was utilized to confirm patency of the left internal jugular vein. The left neck and chest were prepped with chlorhexidine in a sterile fashion, and a sterile drape was applied covering the operative field. Maximum barrier sterile technique with sterile gowns and gloves were used for  the procedure. Local anesthesia was provided with 1% lidocaine. After creating a small venotomy incision, a 21 gauge needle was advanced into the left internal jugular vein under direct, real-time  ultrasound guidance. Ultrasound image documentation was performed. After securing guidewire access, an 8 Fr dilator was placed. A J-wire was kinked to measure appropriate catheter length. A subcutaneous port pocket was then created along the upper chest wall utilizing sharp and blunt dissection. Portable cautery was utilized. The pocket was irrigated with sterile saline. A single lumen power injectable port was chosen for placement. The 8 Fr catheter was tunneled from the port pocket site to the venotomy incision. The port was placed in the pocket. External catheter was trimmed to appropriate length based on guidewire measurement. At the venotomy, an 8 Fr peel-away sheath was placed over a guidewire. The catheter was then placed through the sheath and the sheath removed. Final catheter positioning was confirmed and documented with a fluoroscopic spot image. The port was accessed with a needle and aspirated and flushed with heparinized saline. The access needle was removed. The venotomy and port pocket incisions were closed with subcutaneous 3-0 Monocryl and subcuticular 4-0 Vicryl. Dermabond was applied to both incisions. COMPLICATIONS: COMPLICATIONS None FINDINGS: After catheter placement, the tip lies at the cavo-atrial junction. The catheter aspirates normally and is ready for immediate use. IMPRESSION: Placement of single lumen port a cath via left internal jugular vein. The catheter tip lies at the cavo-atrial junction. A power injectable port a cath was placed and is ready for immediate use. Electronically Signed   By: Aletta Edouard M.D.   On: 06/10/2021 15:28    ASSESSMENT & PLAN:   Primary cancer of right upper lobe of lung ([place]) #Right upper lobe lung mass -concerning for malignancy.  8.2 x 5.9 cm -extends from the right hilum to the lateral chest wall and results in lytic destruction of the lateral portion of the right fourth rib; also noted to have mediastinal adenopathy/hilar lymph nodes; and a 3  cm adrenal mass on the right. SEP 2022 MRI of the brain-WNL.  August 2022-PET scan above. lung findings; adrenal lesion non-avid.Biopsy unfortunately not definitive-however atypical cells suggestive of malignancy noted-no necrosis.   # continue carbo-abraxane with RT [last RT 10/28]; Labs today reviewed;  acceptable for treatment today.   #Hypersensitive reaction to Taxol.-Discontinue Taxol; substituted with Abraxane.  #Right chest wall pain-clinically  STABLE on Tylenol/tramadol [Qhs-2; 5-6 tramadol a day]  # COPD- STABLE: continue  Prn albuterol.  #Diabetes- poorly controlled sec to steroids- PPBG: 252- ;monitor blood sugar levels on therapies; recommend adding glipizide 5 mg  In AM; glucometer/strips. Discussed with joli.  # Smoking: Counseled regarding quitting smoking; recommend putting the patch on.   #IV mediport: functioning/STABLE.   Rodney Vega # DISPOSITION: # Chemo today # 1 week- labs- cbc/cmo;carbo-abraxane # follow up in 2 weeks- MD- labs- cbc/cmp;carbo-abraxane-Dr.B      All questions were answered. The patient knows to call the clinic with any problems, questions or concerns.       [name], MD 06/29/2021 8:39 PM

## 2021-06-29 NOTE — Progress Notes (Signed)
Nutrition Assessment:  65 year old male with right upper lobe lung mass.  Past medical history of pre-diabetes, HTN, smoking.  Patient receiving chemotherapy and radiation.   Met with patient and wife in clinic.  Patient reports that appetite is up and down.  Denies nausea.  Constipation is better, not taking pain medication.  Breakfast usually eats sausage and eggs, toast and coffee (adds sugar). Lunch is sandwich and dinner is usually meat and vegetables.  Snacks on fruit, pudding, yogurt.  Wife has been making milkshakes with milk, ice cream and packet of carnation breakfast essentials packet.      Medications: MD adding glipizide today  Labs: glucose 174  Anthropometrics:   Height: 71 inches Weight: 197 lb today 220 lb 11/12/20 BMI: 27  10% weight loss in the last 7 months  Estimated Energy Needs  Kcals: 2250-2700 Protein: 112-135 g Fluid: 2.2 L  NUTRITION DIAGNOSIS: Inadequate oral intake related to cancer and cancer related treatment side effects as evidenced by 10% weight loss in the last 7 months and decreased intake   INTERVENTION:  Discussed importance of nutrition during treatment and weight maintenance Encouraged protein food at every meal Encouraged patient to reduce sugary beverages to help with better blood glucose control (ie less sweet tea, sugar in coffee, etc).  Provided samples of boost glucose control and glucerna  Contact information provided    MONITORING, EVALUATION, GOAL: weight trends, intake   NEXT VISIT: Thursday, Oct 13th after radiation  Vernetta Dizdarevic B. Zenia Resides, Derma, [place] Registered Dietitian [phone] (mobile)

## 2021-06-29 NOTE — Progress Notes (Signed)
Pt states that he had a reaction to the Taxol at his last treatment. States that is appetite is "holding". Reports that chest/thoracic pain is improving. Feels weak at times.

## 2021-06-29 NOTE — Progress Notes (Signed)
Discontinue diphenhydramine and famotidine from premedication for chemo due to discontinuation of Taxol.  T.O. Dr Dorris Fetch, PharmD

## 2021-06-29 NOTE — Assessment & Plan Note (Addendum)
#  Right upper lobe lung mass -concerning for malignancy.  8.2 x 5.9 cm -extends from the right hilum to the lateral chest wall and results in lytic destruction of the lateral portion of the right fourth rib; also noted to have mediastinal adenopathy/hilar lymph nodes; and a 3 cm adrenal mass on the right. SEP 2022 MRI of the brain-WNL.  August 2022-PET scan above. lung findings; adrenal lesion non-avid.Biopsy unfortunately not definitive-however atypical cells suggestive of malignancy noted-no necrosis.   # continue carbo-abraxane with RT [last RT 10/28]; Labs today reviewed;  acceptable for treatment today.   #Hypersensitive reaction to Taxol.-Discontinue Taxol; substituted with Abraxane.  #Right chest wall pain-clinically  STABLE on Tylenol/tramadol [Qhs-2; 5-6 tramadol a day]  # COPD- STABLE: continue  Prn albuterol.  #Diabetes- poorly controlled sec to steroids- PPBG: 252- ;monitor blood sugar levels on therapies; recommend adding glipizide 5 mg  In AM; glucometer/strips. Discussed with joli.  # Smoking: Counseled regarding quitting smoking; recommend putting the patch on.   #IV mediport: functioning/STABLE.   Rodney Vega # DISPOSITION: # Chemo today # 1 week- labs- cbc/cmo;carbo-abraxane # follow up in 2 weeks- MD- labs- cbc/cmp;carbo-abraxane-Dr.B

## 2021-06-30 ENCOUNTER — Ambulatory Visit
Admission: RE | Admit: 2021-06-30 | Discharge: 2021-06-30 | Disposition: A | Discharge status: Home / Self Care | Payer: Medicare PPO | Source: Ambulatory Visit | Attending: Radiation Oncology | Admitting: Radiation Oncology

## 2021-06-30 DIAGNOSIS — Z510 Encounter for antineoplastic radiation therapy: Secondary | ICD-10-CM | POA: Diagnosis not present

## 2021-07-01 ENCOUNTER — Ambulatory Visit
Admission: RE | Admit: 2021-07-01 | Discharge: 2021-07-01 | Disposition: A | Discharge status: Home / Self Care | Payer: Medicare PPO | Source: Ambulatory Visit | Attending: Radiation Oncology | Admitting: Radiation Oncology

## 2021-07-01 DIAGNOSIS — Z510 Encounter for antineoplastic radiation therapy: Secondary | ICD-10-CM | POA: Diagnosis not present

## 2021-07-02 ENCOUNTER — Ambulatory Visit
Admission: RE | Admit: 2021-07-02 | Discharge: 2021-07-02 | Disposition: A | Discharge status: Home / Self Care | Payer: Medicare PPO | Source: Ambulatory Visit | Attending: Radiation Oncology | Admitting: Radiation Oncology

## 2021-07-02 DIAGNOSIS — Z510 Encounter for antineoplastic radiation therapy: Secondary | ICD-10-CM | POA: Diagnosis not present

## 2021-07-03 ENCOUNTER — Ambulatory Visit
Admission: RE | Admit: 2021-07-03 | Discharge: 2021-07-03 | Disposition: A | Discharge status: Home / Self Care | Payer: Medicare PPO | Source: Ambulatory Visit | Attending: Radiation Oncology | Admitting: Radiation Oncology

## 2021-07-03 DIAGNOSIS — Z510 Encounter for antineoplastic radiation therapy: Secondary | ICD-10-CM | POA: Diagnosis not present

## 2021-07-06 ENCOUNTER — Other Ambulatory Visit: Payer: Self-pay | Admitting: Internal Medicine

## 2021-07-06 ENCOUNTER — Ambulatory Visit
Admission: RE | Admit: 2021-07-06 | Discharge: 2021-07-06 | Disposition: A | Discharge status: Home / Self Care | Payer: Medicare PPO | Source: Ambulatory Visit | Attending: Radiation Oncology | Admitting: Radiation Oncology

## 2021-07-06 ENCOUNTER — Inpatient Hospital Stay: Payer: Medicare PPO

## 2021-07-06 ENCOUNTER — Other Ambulatory Visit: Payer: Self-pay

## 2021-07-06 VITALS — BP 102/69 | HR 106 | Temp 97.0°F | Resp 18

## 2021-07-06 DIAGNOSIS — C3411 Malignant neoplasm of upper lobe, right bronchus or lung: Secondary | ICD-10-CM

## 2021-07-06 DIAGNOSIS — Z510 Encounter for antineoplastic radiation therapy: Secondary | ICD-10-CM | POA: Diagnosis present

## 2021-07-06 DIAGNOSIS — Z5111 Encounter for antineoplastic chemotherapy: Secondary | ICD-10-CM | POA: Insufficient documentation

## 2021-07-06 DIAGNOSIS — C3490 Malignant neoplasm of unspecified part of unspecified bronchus or lung: Secondary | ICD-10-CM

## 2021-07-06 LAB — CBC WITH DIFFERENTIAL/PLATELET
Abs Immature Granulocytes: 0.09 10*3/uL — ABNORMAL HIGH (ref 0.00–0.07)
Basophils Absolute: 0.1 10*3/uL (ref 0.0–0.1)
Basophils Relative: 1 %
Eosinophils Absolute: 0.1 10*3/uL (ref 0.0–0.5)
Eosinophils Relative: 1 %
HCT: 37.1 % — ABNORMAL LOW (ref 39.0–52.0)
Hemoglobin: 12.9 g/dL — ABNORMAL LOW (ref 13.0–17.0)
Immature Granulocytes: 1 %
Lymphocytes Relative: 9 %
Lymphs Abs: 1.1 10*3/uL (ref 0.7–4.0)
MCH: 31 pg (ref 26.0–34.0)
MCHC: 34.8 g/dL (ref 30.0–36.0)
MCV: 89.2 fL (ref 80.0–100.0)
Monocytes Absolute: 0.7 10*3/uL (ref 0.1–1.0)
Monocytes Relative: 6 %
Neutro Abs: 9.6 10*3/uL — ABNORMAL HIGH (ref 1.7–7.7)
Neutrophils Relative %: 82 %
Platelets: 191 10*3/uL (ref 150–400)
RBC: 4.16 MIL/uL — ABNORMAL LOW (ref 4.22–5.81)
RDW: 13.5 % (ref 11.5–15.5)
WBC: 11.6 10*3/uL — ABNORMAL HIGH (ref 4.0–10.5)
nRBC: 0 % (ref 0.0–0.2)

## 2021-07-06 LAB — COMPREHENSIVE METABOLIC PANEL
ALT: 16 U/L (ref 0–44)
AST: 17 U/L (ref 15–41)
Albumin: 3.8 g/dL (ref 3.5–5.0)
Alkaline Phosphatase: 106 U/L (ref 38–126)
Anion gap: 7 (ref 5–15)
BUN: 17 mg/dL (ref 8–23)
CO2: 26 mmol/L (ref 22–32)
Calcium: 9.1 mg/dL (ref 8.9–10.3)
Chloride: 102 mmol/L (ref 98–111)
Creatinine, Ser: 0.98 mg/dL (ref 0.61–1.24)
GFR, Estimated: >60 mL/min (ref >60)
Glucose, Bld: 153 mg/dL — ABNORMAL HIGH (ref 70–99)
Potassium: 4 mmol/L (ref 3.5–5.1)
Sodium: 135 mmol/L (ref 135–145)
Total Bilirubin: 0.5 mg/dL (ref 0.3–1.2)
Total Protein: 7.5 g/dL (ref 6.5–8.1)

## 2021-07-06 MED ORDER — PACLITAXEL PROTEIN-BOUND CHEMO INJECTION 100 MG
25.0000 mg/m2 | Freq: Once | INTRAVENOUS | Status: AC
  Administered 2021-07-06: 50 mg via INTRAVENOUS
  Filled 2021-07-06: qty 10

## 2021-07-06 MED ORDER — HEPARIN SOD (PORK) LOCK FLUSH 100 UNIT/ML IV SOLN
500.0000 [IU] | Freq: Once | INTRAVENOUS | Status: AC | PRN
  Administered 2021-07-06: 500 [IU]
  Filled 2021-07-06: qty 5

## 2021-07-06 MED ORDER — SODIUM CHLORIDE 0.9 % IV SOLN
Freq: Once | INTRAVENOUS | Status: AC
  Filled 2021-07-06: qty 250

## 2021-07-06 MED ORDER — SODIUM CHLORIDE 0.9 % IV SOLN
10.0000 mg | Freq: Once | INTRAVENOUS | Status: AC
  Administered 2021-07-06: 10 mg via INTRAVENOUS
  Filled 2021-07-06: qty 10

## 2021-07-06 MED ORDER — SODIUM CHLORIDE 0.9 % IV SOLN
230.0000 mg | Freq: Once | INTRAVENOUS | Status: AC
  Administered 2021-07-06: 230 mg via INTRAVENOUS
  Filled 2021-07-06: qty 23

## 2021-07-06 MED ORDER — PALONOSETRON HCL INJECTION 0.25 MG/5ML
0.2500 mg | Freq: Once | INTRAVENOUS | Status: AC
  Administered 2021-07-06: 0.25 mg via INTRAVENOUS
  Filled 2021-07-06: qty 5

## 2021-07-06 MED ORDER — HEPARIN SOD (PORK) LOCK FLUSH 100 UNIT/ML IV SOLN
INTRAVENOUS | Status: AC
  Filled 2021-07-06: qty 5

## 2021-07-06 NOTE — Progress Notes (Signed)
Per MD ok to treat with HR 106

## 2021-07-07 ENCOUNTER — Ambulatory Visit
Admission: RE | Admit: 2021-07-07 | Discharge: 2021-07-07 | Disposition: A | Discharge status: Home / Self Care | Payer: Medicare PPO | Source: Ambulatory Visit | Attending: Radiation Oncology | Admitting: Radiation Oncology

## 2021-07-07 DIAGNOSIS — Z510 Encounter for antineoplastic radiation therapy: Secondary | ICD-10-CM | POA: Diagnosis not present

## 2021-07-08 ENCOUNTER — Ambulatory Visit
Admission: RE | Admit: 2021-07-08 | Discharge: 2021-07-08 | Disposition: A | Discharge status: Home / Self Care | Payer: Medicare PPO | Source: Ambulatory Visit | Attending: Radiation Oncology | Admitting: Radiation Oncology

## 2021-07-08 DIAGNOSIS — Z510 Encounter for antineoplastic radiation therapy: Secondary | ICD-10-CM | POA: Diagnosis not present

## 2021-07-09 ENCOUNTER — Ambulatory Visit
Admission: RE | Admit: 2021-07-09 | Discharge: 2021-07-09 | Disposition: A | Discharge status: Home / Self Care | Payer: Medicare PPO | Source: Ambulatory Visit | Attending: Radiation Oncology | Admitting: Radiation Oncology

## 2021-07-09 ENCOUNTER — Other Ambulatory Visit: Payer: Self-pay | Admitting: Internal Medicine

## 2021-07-09 DIAGNOSIS — C3411 Malignant neoplasm of upper lobe, right bronchus or lung: Secondary | ICD-10-CM

## 2021-07-09 DIAGNOSIS — Z510 Encounter for antineoplastic radiation therapy: Secondary | ICD-10-CM | POA: Diagnosis not present

## 2021-07-10 ENCOUNTER — Ambulatory Visit
Admission: RE | Admit: 2021-07-10 | Discharge: 2021-07-10 | Disposition: A | Discharge status: Home / Self Care | Payer: Medicare PPO | Source: Ambulatory Visit | Attending: Radiation Oncology | Admitting: Radiation Oncology

## 2021-07-10 DIAGNOSIS — Z510 Encounter for antineoplastic radiation therapy: Secondary | ICD-10-CM | POA: Diagnosis not present

## 2021-07-13 ENCOUNTER — Ambulatory Visit
Admission: RE | Admit: 2021-07-13 | Discharge: 2021-07-13 | Disposition: A | Discharge status: Home / Self Care | Payer: Medicare PPO | Source: Ambulatory Visit | Attending: Radiation Oncology | Admitting: Radiation Oncology

## 2021-07-13 ENCOUNTER — Inpatient Hospital Stay: Payer: Medicare PPO

## 2021-07-13 ENCOUNTER — Encounter: Payer: Self-pay | Admitting: Internal Medicine

## 2021-07-13 ENCOUNTER — Other Ambulatory Visit: Payer: Self-pay

## 2021-07-13 ENCOUNTER — Inpatient Hospital Stay (HOSPITAL_BASED_OUTPATIENT_CLINIC_OR_DEPARTMENT_OTHER): Payer: Medicare PPO | Admitting: Internal Medicine

## 2021-07-13 VITALS — BP 112/61 | HR 92 | Resp 18

## 2021-07-13 DIAGNOSIS — C3411 Malignant neoplasm of upper lobe, right bronchus or lung: Secondary | ICD-10-CM

## 2021-07-13 DIAGNOSIS — C3490 Malignant neoplasm of unspecified part of unspecified bronchus or lung: Secondary | ICD-10-CM

## 2021-07-13 DIAGNOSIS — Z510 Encounter for antineoplastic radiation therapy: Secondary | ICD-10-CM | POA: Diagnosis not present

## 2021-07-13 LAB — CBC WITH DIFFERENTIAL/PLATELET
Abs Immature Granulocytes: 0.04 10*3/uL (ref 0.00–0.07)
Basophils Absolute: 0 10*3/uL (ref 0.0–0.1)
Basophils Relative: 0 %
Eosinophils Absolute: 0 10*3/uL (ref 0.0–0.5)
Eosinophils Relative: 1 %
HCT: 30.9 % — ABNORMAL LOW (ref 39.0–52.0)
Hemoglobin: 10.7 g/dL — ABNORMAL LOW (ref 13.0–17.0)
Immature Granulocytes: 1 %
Lymphocytes Relative: 9 %
Lymphs Abs: 0.7 10*3/uL (ref 0.7–4.0)
MCH: 31.3 pg (ref 26.0–34.0)
MCHC: 34.6 g/dL (ref 30.0–36.0)
MCV: 90.4 fL (ref 80.0–100.0)
Monocytes Absolute: 0.6 10*3/uL (ref 0.1–1.0)
Monocytes Relative: 8 %
Neutro Abs: 5.8 10*3/uL (ref 1.7–7.7)
Neutrophils Relative %: 81 %
Platelets: 106 10*3/uL — ABNORMAL LOW (ref 150–400)
RBC: 3.42 MIL/uL — ABNORMAL LOW (ref 4.22–5.81)
RDW: 14 % (ref 11.5–15.5)
WBC: 7.1 10*3/uL (ref 4.0–10.5)
nRBC: 0 % (ref 0.0–0.2)

## 2021-07-13 LAB — COMPREHENSIVE METABOLIC PANEL
ALT: 13 U/L (ref 0–44)
AST: 16 U/L (ref 15–41)
Albumin: 3.3 g/dL — ABNORMAL LOW (ref 3.5–5.0)
Alkaline Phosphatase: 77 U/L (ref 38–126)
Anion gap: 8 (ref 5–15)
BUN: 13 mg/dL (ref 8–23)
CO2: 26 mmol/L (ref 22–32)
Calcium: 8.6 mg/dL — ABNORMAL LOW (ref 8.9–10.3)
Chloride: 101 mmol/L (ref 98–111)
Creatinine, Ser: 0.87 mg/dL (ref 0.61–1.24)
GFR, Estimated: >60 mL/min (ref >60)
Glucose, Bld: 154 mg/dL — ABNORMAL HIGH (ref 70–99)
Potassium: 3.5 mmol/L (ref 3.5–5.1)
Sodium: 135 mmol/L (ref 135–145)
Total Bilirubin: 0.5 mg/dL (ref 0.3–1.2)
Total Protein: 7.1 g/dL (ref 6.5–8.1)

## 2021-07-13 MED ORDER — SODIUM CHLORIDE 0.9 % IV SOLN
10.0000 mg | Freq: Once | INTRAVENOUS | Status: AC
  Administered 2021-07-13: 10 mg via INTRAVENOUS
  Filled 2021-07-13: qty 10

## 2021-07-13 MED ORDER — SODIUM CHLORIDE 0.9 % IV SOLN
230.0000 mg | Freq: Once | INTRAVENOUS | Status: AC
  Administered 2021-07-13: 230 mg via INTRAVENOUS
  Filled 2021-07-13: qty 23

## 2021-07-13 MED ORDER — HEPARIN SOD (PORK) LOCK FLUSH 100 UNIT/ML IV SOLN
INTRAVENOUS | Status: AC
  Filled 2021-07-13: qty 5

## 2021-07-13 MED ORDER — HEPARIN SOD (PORK) LOCK FLUSH 100 UNIT/ML IV SOLN
500.0000 [IU] | Freq: Once | INTRAVENOUS | Status: AC | PRN
  Administered 2021-07-13: 500 [IU]
  Filled 2021-07-13: qty 5

## 2021-07-13 MED ORDER — PALONOSETRON HCL INJECTION 0.25 MG/5ML
0.2500 mg | Freq: Once | INTRAVENOUS | Status: AC
  Administered 2021-07-13: 0.25 mg via INTRAVENOUS
  Filled 2021-07-13: qty 5

## 2021-07-13 MED ORDER — SODIUM CHLORIDE 0.9 % IV SOLN
Freq: Once | INTRAVENOUS | Status: AC
  Filled 2021-07-13: qty 250

## 2021-07-13 MED ORDER — PACLITAXEL PROTEIN-BOUND CHEMO INJECTION 100 MG
25.0000 mg/m2 | Freq: Once | INTRAVENOUS | Status: AC
  Administered 2021-07-13: 50 mg via INTRAVENOUS
  Filled 2021-07-13: qty 10

## 2021-07-13 NOTE — Patient Instructions (Signed)
[facility] ONCOLOGY  Discharge Instructions: Thank you for choosing [facility] to provide your oncology and hematology care.  If you have a lab appointment with the [facility], please go directly to the [facility] and check in at the registration area.  Wear comfortable clothing and clothing appropriate for easy access to any Portacath or PICC line.   We strive to give you quality time with your provider. You may need to reschedule your appointment if you arrive late (15 or more minutes).  Arriving late affects you and other patients whose appointments are after yours.  Also, if you miss three or more appointments without notifying the office, you may be dismissed from the clinic at the provider's discretion.      For prescription refill requests, have your pharmacy contact our office and allow 72 hours for refills to be completed.    Today you received the following chemotherapy and/or immunotherapy agents      To help prevent nausea and vomiting after your treatment, we encourage you to take your nausea medication as directed.  BELOW ARE SYMPTOMS THAT SHOULD BE REPORTED IMMEDIATELY: *FEVER GREATER THAN 100.4 F (38 C) OR HIGHER *CHILLS OR SWEATING *NAUSEA AND VOMITING THAT IS NOT CONTROLLED WITH YOUR NAUSEA MEDICATION *UNUSUAL SHORTNESS OF BREATH *UNUSUAL BRUISING OR BLEEDING *URINARY PROBLEMS (pain or burning when urinating, or frequent urination) *BOWEL PROBLEMS (unusual diarrhea, constipation, pain near the anus) TENDERNESS IN MOUTH AND THROAT WITH OR WITHOUT PRESENCE OF ULCERS (sore throat, sores in mouth, or a toothache) UNUSUAL RASH, SWELLING OR PAIN  UNUSUAL VAGINAL DISCHARGE OR ITCHING   Items with * indicate a potential emergency and should be followed up as soon as possible or go to the Emergency Department if any problems should occur.  Please show the CHEMOTHERAPY ALERT CARD or IMMUNOTHERAPY ALERT CARD at check-in to the  Emergency Department and triage nurse.  Should you have questions after your visit or need to cancel or reschedule your appointment, please contact [facility]  [phone] and follow the prompts.  Office hours are 8:00 a.m. to 4:30 p.m. Monday - Friday. Please note that voicemails left after 4:00 p.m. may not be returned until the following business day.  We are closed weekends and major holidays. You have access to a nurse at all times for urgent questions. Please call the main number to the clinic [phone] and follow the prompts.  For any non-urgent questions, you may also contact your provider using MyChart. We now offer e-Visits for anyone 80 and older to request care online for non-urgent symptoms. For details visit [email or web address].   Also download the MyChart app! Go to the app store, search "MyChart", open the app, select [facility], and log in with your MyChart username and password.  Due to Covid, a mask is required upon entering the hospital/clinic. If you do not have a mask, one will be given to you upon arrival. For doctor visits, patients may have 1 support person aged 34 or older with them. For treatment visits, patients cannot have anyone with them due to current Covid guidelines and our immunocompromised population.

## 2021-07-13 NOTE — Progress Notes (Signed)
[facility] NOTE  Patient Care Team: [name], [name], [name], [name]    Oncology History Overview Note  AUG 2022- 8.2 x 5.9 cm spiculated mass noted in right upper lobe consistent with malignancy. It extends from the right hilum to the lateral chest wall and results in lytic destruction of the lateral portion of the right fourth rib.  Mediastinal lymph nodes are noted, including 12 mm right hilar lymph node, consistent with metastatic disease. 3 cm right adrenal mass is noted concerning for metastatic disease.  # AUG 2022- PET IMPRESSION: Peripherally hypermetabolic right upper lobe mass, likely due to central necrosis. Mass extends into the right hilum and right lateral chest wall involving the second through fourth right ribs.   Mildly enlarged and hypermetabolic right hilar lymph node.   No evidence of metastatic disease in the abdomen or pelvis.  # AUG, 2022-RIGHT CHEST WALL Bx-necrosis; suspicious for malignancy not definitive [discussed with Dr.Kraynie;MDT] LUNG MASS, RIGHT; BIOPSY:  - SUSPICIOUS FOR MALIGNANCY.  - SEE COMMENT.   Comment:  Approximately six tissue cores demonstrate inflamed fibrous capsule with  hemosiderin-laden macrophages, in a background of abundant necrosis.  One of the cores demonstrates a minute fragment of viable epithelial  cells.  These cells are positive for p40.  They are negative for TTF1  and CK7. The cells of interest disappear on deeper cut levels.  While  the findings are suspicious for squamous cell carcinoma, there is not  enough viable tumor for a definitive diagnosis.  Additional tissue  sampling may be helpful.   # SEP 2022-cycle #2 Taxol hypersensitive reaction.  Switched over to #3 cycle- carbo-Abraxane starting  06/29/2021   Primary cancer of right upper lobe of lung ([place])  06/03/2021 Initial Diagnosis   Primary cancer of right upper lobe of lung ([place])   06/03/2021 Cancer Staging   Staging form: Lung, AJCC 8th Edition - Clinical: Stage IIIB (cT3, cN2, cM0) - Signed by [name], MD on 06/03/2021   06/15/2021 -  Chemotherapy   Patient is on Treatment Plan : LUNG Carboplatin / Paclitaxel + XRT q7d        HISTORY OF PRESENTING ILLNESS: Ambulating independently.  Accompanied by wife.  Rodney Vega 65 y.o.  male history of smoking -"lung cancer" [Limited necrotic tissue]-stage III unresectable currently on chemoradiation- carbo-abraxane is here for follow-up.  Patient complains of mild nausea.  Complains of mild tingling and numbness EXTR.  Not any worse.  No falls.  Denies any fevers or chills.  + Fatigue.  Review of Systems  Constitutional:  Positive for malaise/fatigue. Negative for chills, diaphoresis, fever and weight loss.  HENT:  Negative for nosebleeds and sore throat.   Eyes:  Negative for double vision.  Respiratory:  Positive for shortness of breath. Negative for cough, hemoptysis, sputum production and wheezing.   Cardiovascular:  Negative for chest pain, palpitations, orthopnea and leg swelling.  Gastrointestinal:  Positive for nausea. Negative for abdominal pain, blood in stool, constipation, diarrhea, heartburn, melena and vomiting.  Genitourinary:  Negative for dysuria, frequency and urgency.  Musculoskeletal:  Negative for back pain and joint pain.  Skin: Negative.  Negative for itching and rash.  Neurological:  Positive for tingling. Negative for dizziness, focal weakness, weakness and headaches.  Endo/Heme/Allergies:  Does not bruise/bleed easily.  Psychiatric/Behavioral:  Negative for depression.  The patient is not nervous/anxious and does not have insomnia.     MEDICAL HISTORY:  Past Medical History:  Diagnosis Date  . Cancer ([place])   . Degenerative arthritis  of knee   . Depression   . Diverticulitis   . Glaucoma    since 2004  . Hernia 1979  . History of kidney stones   . Hypertension   . Personal history of tobacco use, presenting hazards to health   . Pre-diabetes   . Screening for obesity   . Special screening for malignant neoplasms, colon   . Wears dentures    full upper and lower    SURGICAL HISTORY: Past Surgical History:  Procedure Laterality Date  . [place], 2012   lumbar bulging disc  . CATARACT EXTRACTION W/PHACO Left 01/20/2021   Procedure: CATARACT EXTRACTION PHACO AND INTRAOCULAR LENS PLACEMENT ([place]) LEFT;  Surgeon: Birder Robson, MD;  Location: [facility];  Service: Ophthalmology;  Laterality: Left;  10.13 0:52.1  . COLONOSCOPY  [id number]   [facility]/Dr. Buelah Manis sessile polyp,48mm, found in rectum,multiple diverticula were found in the sigmoid, in descending and in transverse colon  . COLOSTOMY  04-20-13  . HERNIA REPAIR  1979   inguinal  . IR IMAGING GUIDED PORT INSERTION  06/10/2021  . JOINT REPLACEMENT Right    knee  . KNEE ARTHROPLASTY Right 05/08/2018   Procedure: COMPUTER ASSISTED TOTAL KNEE ARTHROPLASTY;  Surgeon: Dereck Leep, MD;  Location: [facility];  Service: Orthopedics;  Laterality: Right;  . KNEE ARTHROPLASTY Left 11/16/2019   Procedure: COMPUTER ASSISTED TOTAL KNEE ARTHROPLASTY;  Surgeon: Dereck Leep, MD;  Location: [facility];  Service: Orthopedics;  Laterality: Left;  . LAPAROTOMY  04-20-13   colon resection with colosotmy  . LITHOTRIPSY  2013  . TONSILLECTOMY AND ADENOIDECTOMY  1962    SOCIAL HISTORY: Social History   Socioeconomic History  . Marital status: Single    Spouse name: Not on file  . Number of children: Not on file  . Years of education: Not on file  . Highest education level: Not on file  Occupational History  . Not on file  Tobacco Use  . Smoking status: Every Day    Packs/day: 1.00    Years: 20.00    Pack years: 20.00    Types: Cigarettes  . Smokeless  tobacco: Never  . Tobacco comments:    since age 81 or 51  Vaping Use  . Vaping Use: Never used  Substance and Sexual Activity  . Alcohol use: Yes    Comment: occassional  . Drug use: Yes    Types: Marijuana  . Sexual activity: Not on file  Other Topics Concern  . Not on file  Social History Narrative   15 mins [place]; smoking; not much alcohol. Worked for [employer], 2019. 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 Diabetes Mother   . Heart disease Mother   . Heart attack Father   . Prostate cancer Father     ALLERGIES:  is allergic to paclitaxel, ambien [zolpidem], and aleve [naproxen sodium].  MEDICATIONS:  Current Outpatient Medications  Medication Sig Dispense Refill  .  acetaminophen (TYLENOL) 500 MG tablet Take 1,000 mg by mouth daily.    Marland Kitchen albuterol (VENTOLIN HFA) 108 (90 Base) MCG/ACT inhaler Inhale 2 puffs into the lungs every 6 (six) hours as needed for wheezing or shortness of breath. 1 each 2  . Baclofen 5 MG TABS TAKE 1 TABLET(5 MG) BY MOUTH EVERY NIGHT AS NEEDED FOR PAIN    . blood glucose meter kit and supplies KIT Check your blood glucose levels twice a day; once in the morning before breakfast; and once in the evening after dinner. 1 each 0  . cetirizine (ZYRTEC) 10 MG tablet Take 10 mg by mouth daily.    . Cyanocobalamin (VITAMIN B-12 PO) Take by mouth daily.    Marland Kitchen dexamethasone (DECADRON) 4 MG tablet Take 2 tablets (8 mg total) by mouth daily. Start the day after chemotherapy for 2 days. 30 tablet 1  . diphenhydramine-acetaminophen (TYLENOL PM) 25-500 MG TABS Take 1-2 tablets by mouth at bedtime as needed (sleep.).     Marland Kitchen dorzolamide-timolol  (COSOPT) 22.3-6.8 MG/ML ophthalmic solution Place 1 drop into both eyes 2 (two) times daily.    Marland Kitchen glipiZIDE (GLUCOTROL) 5 MG tablet Take 1 tablet (5 mg total) by mouth daily before breakfast. 30 tablet 1  . glucose blood test strip Check your blood glucose levels twice a day; once in the morning before breakfast; and once in the evening after dinner. 100 each 12  . guaiFENesin (MUCINEX) 600 MG 12 hr tablet Take by mouth 2 (two) times daily as needed.    . Lancets (FREESTYLE) lancets Check your blood glucose levels twice a day; once in the morning before breakfast; and once in the evening after dinner. 100 each 12  . latanoprost (XALATAN) 0.005 % ophthalmic solution Place 1 drop into both eyes at bedtime.    . lidocaine-prilocaine (EMLA) cream Apply to affected area once 30 g 3  . nicotine (NICODERM CQ - DOSED IN MG/24 HOURS) 21 mg/24hr patch Place 1 patch (21 mg total) onto the skin daily. 28 patch 1  . olmesartan (BENICAR) 40 MG tablet Take 40 mg by mouth daily.    . ondansetron (ZOFRAN) 8 MG tablet Take 1 tablet (8 mg total) by mouth 2 (two) times daily as needed for refractory nausea / vomiting. Start on day 3 after chemo. 30 tablet 1  . prochlorperazine (COMPAZINE) 10 MG tablet TAKE 1 TABLET(10 MG) BY MOUTH EVERY 6 HOURS AS NEEDED FOR NAUSEA OR VOMITING 30 tablet 1  . traMADol (ULTRAM) 50 MG tablet TAKE 1 TABLET(50 MG) BY MOUTH EVERY 8 HOURS AS NEEDED 60 tablet 0  . traZODone (DESYREL) 50 MG tablet Take 1 tablet (50 mg total) by mouth at bedtime as needed for sleep. 30 tablet 1  . venlafaxine XR (EFFEXOR-XR) 75 MG 24 hr capsule Take 225 mg by mouth daily.     No current facility-administered medications for this visit.   Facility-Administered Medications Ordered in Other Visits  Medication Dose Route Frequency Provider Last Rate Last Admin  . heparin lock flush 100 UNIT/ML injection           . heparin lock flush 100 UNIT/ML injection               .  PHYSICAL EXAMINATION: ECOG  PERFORMANCE STATUS: 1 - Symptomatic but completely ambulatory  Vitals:   07/13/21 0836  BP: 125/69  Pulse: (!) 102  Resp: 17  Temp: 98.5 F (36.9 C)  SpO2: 100%    Filed Weights  07/13/21 0836  Weight: 195 lb (88.5 kg)     Physical Exam Vitals and nursing note reviewed.  HENT:     Head: Normocephalic and atraumatic.     Mouth/Throat:     Pharynx: Oropharynx is clear.  Eyes:     Extraocular Movements: Extraocular movements intact.     Pupils: Pupils are equal, round, and reactive to light.  Cardiovascular:     Rate and Rhythm: Normal rate and regular rhythm.  Pulmonary:     Comments: Decreased breath sounds bilaterally.  Abdominal:     Palpations: Abdomen is soft.  Musculoskeletal:        General: Normal range of motion.     Cervical back: Normal range of motion.  Skin:    General: Skin is warm.  Neurological:     General: No focal deficit present.     Mental Status: He is alert and oriented to person, place, and time.  Psychiatric:        Behavior: Behavior normal.        Judgment: Judgment normal.     LABORATORY DATA:  I have reviewed the data as listed Lab Results  Component Value Date   WBC 7.1 07/13/2021   HGB 10.7 (L) 07/13/2021   HCT 30.9 (L) 07/13/2021   MCV 90.4 07/13/2021   PLT 106 (L) 07/13/2021   Recent Labs    06/29/21 0850 07/06/21 0820 07/13/21 0822  NA 133* 135 135  K 4.1 4.0 3.5  CL 98 102 101  CO2 $Re'26 26 26  'jxI$ GLUCOSE 174* 153* 154*  BUN $Re'17 17 13  'JqD$ CREATININE 0.94 0.98 0.87  CALCIUM 9.4 9.1 8.6*  GFRNONAA >60 >60 >60  PROT 7.1 7.5 7.1  ALBUMIN 3.7 3.8 3.3*  AST $Re'18 17 16  'Rgo$ ALT $R'15 16 13  'mV$ ALKPHOS 117 106 77  BILITOT 0.4 0.5 0.5    RADIOGRAPHIC STUDIES: I have personally reviewed the radiological images as listed and agreed with the findings in the report. No results found.  ASSESSMENT & PLAN:   Primary cancer of right upper lobe of lung ([place]) #Right upper lobe lung mass -concerning for malignancy.  8.2 x 5.9 cm -extends  from the right hilum to the lateral chest wall and results in lytic destruction of the lateral portion of the right fourth rib; also noted to have mediastinal adenopathy/hilar lymph nodes; and a 3 cm adrenal mass on the right. SEP 2022 MRI of the brain-WNL.  August 2022-PET scan above. lung findings; adrenal lesion non-avid.Biopsy unfortunately not definitive-however atypical cells suggestive of malignancy noted-no necrosis. STABLE.   # continue carbo-abraxane with RT [last RT 10/28]; Labs today reviewed;  acceptable for treatment today. Plan 3-4 more chemo [last tentative on 10/31]; will scan after 1 month.   # PN-G-1-2 sec to chemo- monitor for now.   #Right chest wall pain-on Tylenol/tramadol [Qhs-2; 5-6 tramadol a day]- STABLE.   # COPD:  continue  Prn albuterol.- STABLE  #Diabetes- poorly controlled sec to steroids- PPBG: 154 ; at home blood sugar levels- 124-141 -in AM  glipizide 5 mg  In AM.   # Smoking: Counseled regarding quitting smoking; currentlly putting the patch on.   # IV mediport: functioning/STABLE  # DISPOSITION: # Chemo today # 1 week- labs- cbc/cmo;carbo-abraxane # follow up in 2 weeks- MD- labs- cbc/cmp;carbo-abraxane-Dr.B      All questions were answered. The patient knows to call the clinic with any problems, questions or concerns.       [name],  MD 07/13/2021 5:02 PM

## 2021-07-13 NOTE — Progress Notes (Signed)
Patient here for oncology follow-up appointment, concerns of fatigue, nausea, elevated HR, and neuropathy

## 2021-07-13 NOTE — Assessment & Plan Note (Addendum)
#  Right upper lobe lung mass -concerning for malignancy.  8.2 x 5.9 cm -extends from the right hilum to the lateral chest wall and results in lytic destruction of the lateral portion of the right fourth rib; also noted to have mediastinal adenopathy/hilar lymph nodes; and a 3 cm adrenal mass on the right. SEP 2022 MRI of the brain-WNL.  August 2022-PET scan above. lung findings; adrenal lesion non-avid.Biopsy unfortunately not definitive-however atypical cells suggestive of malignancy noted-no necrosis. STABLE.   # continue carbo-abraxane with RT [last RT 10/28]; Labs today reviewed;  acceptable for treatment today. Plan 3-4 more chemo [last tentative on 10/31]; will scan after 1 month.   # PN-G-1-2 sec to chemo- monitor for now.   #Right chest wall pain-on Tylenol/tramadol [Qhs-2; 5-6 tramadol a day]- STABLE.   # COPD:  continue  Prn albuterol.- STABLE  #Diabetes- poorly controlled sec to steroids- PPBG: 154 ; at home blood sugar levels- 124-141 -in AM  glipizide 5 mg  In AM.   # Smoking: Counseled regarding quitting smoking; currentlly putting the patch on.   # IV mediport: functioning/STABLE  # DISPOSITION: # Chemo today # 1 week- labs- cbc/cmo;carbo-abraxane # follow up in 2 weeks- MD- labs- cbc/cmp;carbo-abraxane-Dr.B

## 2021-07-14 ENCOUNTER — Ambulatory Visit
Admission: RE | Admit: 2021-07-14 | Discharge: 2021-07-14 | Disposition: A | Discharge status: Home / Self Care | Payer: Medicare PPO | Source: Ambulatory Visit | Attending: Radiation Oncology | Admitting: Radiation Oncology

## 2021-07-14 DIAGNOSIS — Z510 Encounter for antineoplastic radiation therapy: Secondary | ICD-10-CM | POA: Diagnosis not present

## 2021-07-15 ENCOUNTER — Ambulatory Visit
Admission: RE | Admit: 2021-07-15 | Discharge: 2021-07-15 | Disposition: A | Discharge status: Home / Self Care | Payer: Medicare PPO | Source: Ambulatory Visit | Attending: Radiation Oncology | Admitting: Radiation Oncology

## 2021-07-15 DIAGNOSIS — Z510 Encounter for antineoplastic radiation therapy: Secondary | ICD-10-CM | POA: Diagnosis not present

## 2021-07-16 ENCOUNTER — Ambulatory Visit
Admission: RE | Admit: 2021-07-16 | Discharge: 2021-07-16 | Disposition: A | Discharge status: Home / Self Care | Payer: Medicare PPO | Source: Ambulatory Visit | Attending: Radiation Oncology | Admitting: Radiation Oncology

## 2021-07-16 ENCOUNTER — Inpatient Hospital Stay: Payer: Medicare PPO

## 2021-07-16 DIAGNOSIS — Z510 Encounter for antineoplastic radiation therapy: Secondary | ICD-10-CM | POA: Diagnosis not present

## 2021-07-16 NOTE — Progress Notes (Signed)
Nutrition Follow-up:  Patient with right upper lobe mass.  Patient receiving chemotherapy and radiation.    Met with patient and wife following radiation.  Patient reports appetite is up and down. Does not eat as much as he used too. Breakfast is usually 2 eggs, sausage.  Lunch maybe sandwich or 2 corn dogs or soup.  Supper wife prepares, tonight having tacos.  Says that taste is off.  Likes to snack on peanut butter crackers, fruit, yogurt.  Wife makes him carnation breakfast, milk and ice cream shake but has not had one in awhile.  Says that he feels fatigued.  Has some nausea but takes a pill and it goes away.  Reports some dry mouth as well.     Medications: reviewed  Labs: reviewed  Anthropometrics:   Weight 195 lb on 10/10  197 lb on 9/26   NUTRITION DIAGNOSIS: Inadequate oral intake stable    INTERVENTION:  Discussed strategies to help with taste alterations. Handout provided Discussed strategies to help with dry mouth.     MONITORING, EVALUATION, GOAL: weight trends, intake   NEXT VISIT: Thursday, Oct 27 after radiation  Dionne Knoop B. Zenia Resides, [place], [place] Registered Dietitian [phone] (mobile)

## 2021-07-17 ENCOUNTER — Ambulatory Visit
Admission: RE | Admit: 2021-07-17 | Discharge: 2021-07-17 | Disposition: A | Discharge status: Home / Self Care | Payer: Medicare PPO | Source: Ambulatory Visit | Attending: Radiation Oncology | Admitting: Radiation Oncology

## 2021-07-17 DIAGNOSIS — Z510 Encounter for antineoplastic radiation therapy: Secondary | ICD-10-CM | POA: Diagnosis not present

## 2021-07-20 ENCOUNTER — Other Ambulatory Visit: Payer: Self-pay

## 2021-07-20 ENCOUNTER — Ambulatory Visit
Admission: RE | Admit: 2021-07-20 | Discharge: 2021-07-20 | Disposition: A | Discharge status: Home / Self Care | Payer: Medicare PPO | Source: Ambulatory Visit | Attending: Radiation Oncology | Admitting: Radiation Oncology

## 2021-07-20 ENCOUNTER — Inpatient Hospital Stay: Payer: Medicare PPO

## 2021-07-20 VITALS — BP 125/70 | HR 91 | Temp 96.9°F | Resp 18 | Wt 194.8 lb

## 2021-07-20 DIAGNOSIS — C3490 Malignant neoplasm of unspecified part of unspecified bronchus or lung: Secondary | ICD-10-CM

## 2021-07-20 DIAGNOSIS — C3411 Malignant neoplasm of upper lobe, right bronchus or lung: Secondary | ICD-10-CM

## 2021-07-20 DIAGNOSIS — Z510 Encounter for antineoplastic radiation therapy: Secondary | ICD-10-CM | POA: Diagnosis not present

## 2021-07-20 LAB — CBC WITH DIFFERENTIAL/PLATELET
Abs Immature Granulocytes: 0.04 10*3/uL (ref 0.00–0.07)
Basophils Absolute: 0 10*3/uL (ref 0.0–0.1)
Basophils Relative: 1 %
Eosinophils Absolute: 0 10*3/uL (ref 0.0–0.5)
Eosinophils Relative: 1 %
HCT: 29 % — ABNORMAL LOW (ref 39.0–52.0)
Hemoglobin: 10.2 g/dL — ABNORMAL LOW (ref 13.0–17.0)
Immature Granulocytes: 1 %
Lymphocytes Relative: 14 %
Lymphs Abs: 0.7 10*3/uL (ref 0.7–4.0)
MCH: 32.4 pg (ref 26.0–34.0)
MCHC: 35.2 g/dL (ref 30.0–36.0)
MCV: 92.1 fL (ref 80.0–100.0)
Monocytes Absolute: 0.2 10*3/uL (ref 0.1–1.0)
Monocytes Relative: 4 %
Neutro Abs: 4 10*3/uL (ref 1.7–7.7)
Neutrophils Relative %: 79 %
Platelets: 116 10*3/uL — ABNORMAL LOW (ref 150–400)
RBC: 3.15 MIL/uL — ABNORMAL LOW (ref 4.22–5.81)
RDW: 14.3 % (ref 11.5–15.5)
WBC: 4.9 10*3/uL (ref 4.0–10.5)
nRBC: 0 % (ref 0.0–0.2)

## 2021-07-20 LAB — COMPREHENSIVE METABOLIC PANEL
ALT: 12 U/L (ref 0–44)
AST: 18 U/L (ref 15–41)
Albumin: 3.2 g/dL — ABNORMAL LOW (ref 3.5–5.0)
Alkaline Phosphatase: 76 U/L (ref 38–126)
Anion gap: 9 (ref 5–15)
BUN: 11 mg/dL (ref 8–23)
CO2: 25 mmol/L (ref 22–32)
Calcium: 8.6 mg/dL — ABNORMAL LOW (ref 8.9–10.3)
Chloride: 100 mmol/L (ref 98–111)
Creatinine, Ser: 1 mg/dL (ref 0.61–1.24)
GFR, Estimated: >60 mL/min (ref >60)
Glucose, Bld: 246 mg/dL — ABNORMAL HIGH (ref 70–99)
Potassium: 3.6 mmol/L (ref 3.5–5.1)
Sodium: 134 mmol/L — ABNORMAL LOW (ref 135–145)
Total Bilirubin: 0.3 mg/dL (ref 0.3–1.2)
Total Protein: 7.1 g/dL (ref 6.5–8.1)

## 2021-07-20 MED ORDER — SODIUM CHLORIDE 0.9% FLUSH
10.0000 mL | Freq: Once | INTRAVENOUS | Status: DC
  Filled 2021-07-20: qty 10

## 2021-07-20 MED ORDER — HEPARIN SOD (PORK) LOCK FLUSH 100 UNIT/ML IV SOLN
INTRAVENOUS | Status: AC
  Administered 2021-07-20: 500 [IU]
  Filled 2021-07-20: qty 5

## 2021-07-20 MED ORDER — PACLITAXEL PROTEIN-BOUND CHEMO INJECTION 100 MG
25.0000 mg/m2 | Freq: Once | INTRAVENOUS | Status: AC
  Administered 2021-07-20: 50 mg via INTRAVENOUS
  Filled 2021-07-20: qty 10

## 2021-07-20 MED ORDER — SODIUM CHLORIDE 0.9 % IV SOLN
230.0000 mg | Freq: Once | INTRAVENOUS | Status: AC
  Administered 2021-07-20: 230 mg via INTRAVENOUS
  Filled 2021-07-20: qty 23

## 2021-07-20 MED ORDER — SODIUM CHLORIDE 0.9 % IV SOLN
Freq: Once | INTRAVENOUS | Status: AC
  Filled 2021-07-20: qty 250

## 2021-07-20 MED ORDER — HEPARIN SOD (PORK) LOCK FLUSH 100 UNIT/ML IV SOLN
500.0000 [IU] | Freq: Once | INTRAVENOUS | Status: AC | PRN
  Filled 2021-07-20: qty 5

## 2021-07-20 MED ORDER — PALONOSETRON HCL INJECTION 0.25 MG/5ML
0.2500 mg | Freq: Once | INTRAVENOUS | Status: AC
  Administered 2021-07-20: 0.25 mg via INTRAVENOUS
  Filled 2021-07-20: qty 5

## 2021-07-20 MED ORDER — SODIUM CHLORIDE 0.9 % IV SOLN
10.0000 mg | Freq: Once | INTRAVENOUS | Status: AC
  Administered 2021-07-20: 10 mg via INTRAVENOUS
  Filled 2021-07-20: qty 10

## 2021-07-21 ENCOUNTER — Ambulatory Visit
Admission: RE | Admit: 2021-07-21 | Discharge: 2021-07-21 | Disposition: A | Discharge status: Home / Self Care | Payer: Medicare PPO | Source: Ambulatory Visit | Attending: Radiation Oncology | Admitting: Radiation Oncology

## 2021-07-21 DIAGNOSIS — Z510 Encounter for antineoplastic radiation therapy: Secondary | ICD-10-CM | POA: Diagnosis not present

## 2021-07-22 ENCOUNTER — Ambulatory Visit
Admission: RE | Admit: 2021-07-22 | Discharge: 2021-07-22 | Disposition: A | Discharge status: Home / Self Care | Payer: Medicare PPO | Source: Ambulatory Visit | Attending: Radiation Oncology | Admitting: Radiation Oncology

## 2021-07-22 ENCOUNTER — Other Ambulatory Visit: Payer: Self-pay | Admitting: Internal Medicine

## 2021-07-22 DIAGNOSIS — Z510 Encounter for antineoplastic radiation therapy: Secondary | ICD-10-CM | POA: Diagnosis not present

## 2021-07-23 ENCOUNTER — Other Ambulatory Visit: Payer: Self-pay | Admitting: Radiation Oncology

## 2021-07-23 ENCOUNTER — Ambulatory Visit
Admission: RE | Admit: 2021-07-23 | Discharge: 2021-07-23 | Disposition: A | Discharge status: Home / Self Care | Payer: Medicare PPO | Source: Ambulatory Visit | Attending: Radiation Oncology | Admitting: Radiation Oncology

## 2021-07-23 ENCOUNTER — Other Ambulatory Visit: Payer: Self-pay | Admitting: *Deleted

## 2021-07-23 DIAGNOSIS — Z510 Encounter for antineoplastic radiation therapy: Secondary | ICD-10-CM | POA: Diagnosis not present

## 2021-07-23 MED ORDER — SUCRALFATE 1 G PO TABS: 1.0000 g | ORAL_TABLET | Freq: Three times a day (TID) | ORAL | 1 refills | Status: DC

## 2021-07-24 ENCOUNTER — Ambulatory Visit
Admission: RE | Admit: 2021-07-24 | Discharge: 2021-07-24 | Disposition: A | Discharge status: Home / Self Care | Payer: Medicare PPO | Source: Ambulatory Visit | Attending: Radiation Oncology | Admitting: Radiation Oncology

## 2021-07-24 DIAGNOSIS — Z510 Encounter for antineoplastic radiation therapy: Secondary | ICD-10-CM | POA: Diagnosis not present

## 2021-07-27 ENCOUNTER — Encounter: Payer: Self-pay | Admitting: Internal Medicine

## 2021-07-27 ENCOUNTER — Inpatient Hospital Stay (HOSPITAL_BASED_OUTPATIENT_CLINIC_OR_DEPARTMENT_OTHER): Payer: Medicare PPO | Admitting: Internal Medicine

## 2021-07-27 ENCOUNTER — Inpatient Hospital Stay: Payer: Medicare PPO

## 2021-07-27 ENCOUNTER — Ambulatory Visit
Admission: RE | Admit: 2021-07-27 | Discharge: 2021-07-27 | Disposition: A | Discharge status: Home / Self Care | Payer: Medicare PPO | Source: Ambulatory Visit | Attending: Radiation Oncology | Admitting: Radiation Oncology

## 2021-07-27 ENCOUNTER — Other Ambulatory Visit: Payer: Self-pay

## 2021-07-27 VITALS — HR 88

## 2021-07-27 DIAGNOSIS — C3411 Malignant neoplasm of upper lobe, right bronchus or lung: Secondary | ICD-10-CM

## 2021-07-27 DIAGNOSIS — Z510 Encounter for antineoplastic radiation therapy: Secondary | ICD-10-CM | POA: Diagnosis not present

## 2021-07-27 DIAGNOSIS — C3490 Malignant neoplasm of unspecified part of unspecified bronchus or lung: Secondary | ICD-10-CM

## 2021-07-27 LAB — COMPREHENSIVE METABOLIC PANEL
ALT: 16 U/L (ref 0–44)
AST: 17 U/L (ref 15–41)
Albumin: 3.4 g/dL — ABNORMAL LOW (ref 3.5–5.0)
Alkaline Phosphatase: 78 U/L (ref 38–126)
Anion gap: 6 (ref 5–15)
BUN: 11 mg/dL (ref 8–23)
CO2: 27 mmol/L (ref 22–32)
Calcium: 8.9 mg/dL (ref 8.9–10.3)
Chloride: 99 mmol/L (ref 98–111)
Creatinine, Ser: 0.91 mg/dL (ref 0.61–1.24)
GFR, Estimated: >60 mL/min (ref >60)
Glucose, Bld: 213 mg/dL — ABNORMAL HIGH (ref 70–99)
Potassium: 3.7 mmol/L (ref 3.5–5.1)
Sodium: 132 mmol/L — ABNORMAL LOW (ref 135–145)
Total Bilirubin: 0.4 mg/dL (ref 0.3–1.2)
Total Protein: 8 g/dL (ref 6.5–8.1)

## 2021-07-27 LAB — CBC WITH DIFFERENTIAL/PLATELET
Abs Immature Granulocytes: 0.02 10*3/uL (ref 0.00–0.07)
Basophils Absolute: 0 10*3/uL (ref 0.0–0.1)
Basophils Relative: 0 %
Eosinophils Absolute: 0 10*3/uL (ref 0.0–0.5)
Eosinophils Relative: 1 %
HCT: 28 % — ABNORMAL LOW (ref 39.0–52.0)
Hemoglobin: 9.8 g/dL — ABNORMAL LOW (ref 13.0–17.0)
Immature Granulocytes: 1 %
Lymphocytes Relative: 16 %
Lymphs Abs: 0.6 10*3/uL — ABNORMAL LOW (ref 0.7–4.0)
MCH: 31.8 pg (ref 26.0–34.0)
MCHC: 35 g/dL (ref 30.0–36.0)
MCV: 90.9 fL (ref 80.0–100.0)
Monocytes Absolute: 0.5 10*3/uL (ref 0.1–1.0)
Monocytes Relative: 14 %
Neutro Abs: 2.4 10*3/uL (ref 1.7–7.7)
Neutrophils Relative %: 68 %
Platelets: 216 10*3/uL (ref 150–400)
RBC: 3.08 MIL/uL — ABNORMAL LOW (ref 4.22–5.81)
RDW: 15.3 % (ref 11.5–15.5)
WBC: 3.5 10*3/uL — ABNORMAL LOW (ref 4.0–10.5)
nRBC: 0 % (ref 0.0–0.2)

## 2021-07-27 MED ORDER — PALONOSETRON HCL INJECTION 0.25 MG/5ML
0.2500 mg | Freq: Once | INTRAVENOUS | Status: AC
  Administered 2021-07-27: 0.25 mg via INTRAVENOUS
  Filled 2021-07-27: qty 5

## 2021-07-27 MED ORDER — PACLITAXEL PROTEIN-BOUND CHEMO INJECTION 100 MG
25.0000 mg/m2 | Freq: Once | INTRAVENOUS | Status: AC
  Administered 2021-07-27: 50 mg via INTRAVENOUS
  Filled 2021-07-27: qty 10

## 2021-07-27 MED ORDER — SODIUM CHLORIDE 0.9 % IV SOLN
Freq: Once | INTRAVENOUS | Status: AC
  Filled 2021-07-27: qty 250

## 2021-07-27 MED ORDER — SODIUM CHLORIDE 0.9 % IV SOLN
230.0000 mg | Freq: Once | INTRAVENOUS | Status: AC
  Administered 2021-07-27: 230 mg via INTRAVENOUS
  Filled 2021-07-27: qty 23

## 2021-07-27 MED ORDER — MONTELUKAST SODIUM 10 MG PO TABS: 10.0000 mg | ORAL_TABLET | Freq: Every day | ORAL | 0 refills | Status: DC

## 2021-07-27 MED ORDER — HEPARIN SOD (PORK) LOCK FLUSH 100 UNIT/ML IV SOLN
500.0000 [IU] | Freq: Once | INTRAVENOUS | Status: AC | PRN
  Administered 2021-07-27: 500 [IU]
  Filled 2021-07-27: qty 5

## 2021-07-27 MED ORDER — SODIUM CHLORIDE 0.9 % IV SOLN
10.0000 mg | Freq: Once | INTRAVENOUS | Status: AC
  Administered 2021-07-27: 10 mg via INTRAVENOUS
  Filled 2021-07-27: qty 10

## 2021-07-27 NOTE — Patient Instructions (Signed)
-  recommend Singulair-called in a prescription/Claritin once a day.

## 2021-07-27 NOTE — Progress Notes (Signed)
[place] NOTE  Patient Care Team: [name], [name], [name], [name]    Oncology History Overview Note  AUG 2022- 8.2 x 5.9 cm spiculated mass noted in right upper lobe consistent with malignancy. It extends from the right hilum to the lateral chest wall and results in lytic destruction of the lateral portion of the right fourth rib.  Mediastinal lymph nodes are noted, including 12 mm right hilar lymph node, consistent with metastatic disease. 3 cm right adrenal mass is noted concerning for metastatic disease.  # AUG 2022- PET IMPRESSION: Peripherally hypermetabolic right upper lobe mass, likely due to central necrosis. Mass extends into the right hilum and right lateral chest wall involving the second through fourth right ribs.   Mildly enlarged and hypermetabolic right hilar lymph node.   No evidence of metastatic disease in the abdomen or pelvis.  # AUG, 2022-RIGHT CHEST WALL Bx-necrosis; suspicious for malignancy not definitive [discussed with Dr.Kraynie;MDT] LUNG MASS, RIGHT; BIOPSY:  - SUSPICIOUS FOR MALIGNANCY.  - SEE COMMENT.   Comment:  Approximately six tissue cores demonstrate inflamed fibrous capsule with  hemosiderin-laden macrophages, in a background of abundant necrosis.  One of the cores demonstrates a minute fragment of viable epithelial  cells.  These cells are positive for p40.  They are negative for TTF1  and CK7. The cells of interest disappear on deeper cut levels.  While  the findings are suspicious for squamous cell carcinoma, there is not  enough viable tumor for a definitive diagnosis.  Additional tissue  sampling may be helpful.   # SEP 2022-cycle #2 Taxol hypersensitive reaction.  Switched over to #3 cycle- carbo-Abraxane starting  06/29/2021   Primary cancer of right upper lobe of lung ([place])  06/03/2021 Initial Diagnosis   Primary cancer of right upper lobe of lung ([place])   06/03/2021 Cancer Staging   Staging form: Lung, AJCC 8th Edition - Clinical: Stage IIIB (cT3, cN2, cM0) - Signed by [name], MD on 06/03/2021    06/15/2021 -  Chemotherapy   Patient is on Treatment Plan : LUNG Carboplatin / Paclitaxel + XRT q7d        HISTORY OF PRESENTING ILLNESS: Ambulating independently.  Accompanied by wife.  Rodney Vega 65 y.o.  male history of smoking -"lung cancer" [Limited necrotic tissue]-stage III unresectable currently on chemoradiation- carbo-abraxane is here for follow-up.  Continues to complain of pain in the right chest wall when sneezing.  Mild tingling and numbness in extremities.  Mild nausea improved with antiemetics.  No fever no chills.  No falls.  Review of Systems  Constitutional:  Positive for malaise/fatigue. Negative for chills, diaphoresis, fever and weight loss.  HENT:  Negative for nosebleeds and sore throat.   Eyes:  Negative for double vision.  Respiratory:  Positive for shortness of breath. Negative for cough, hemoptysis, sputum production and wheezing.   Cardiovascular:  Negative for chest pain, palpitations, orthopnea and leg swelling.  Gastrointestinal:  Positive for nausea. Negative for abdominal pain, blood in stool, constipation, diarrhea, heartburn, melena and vomiting.  Genitourinary:  Negative for dysuria, frequency and urgency.  Musculoskeletal:  Negative for back pain and joint pain.  Skin: Negative.  Negative for itching and rash.  Neurological:  Positive for tingling. Negative for dizziness, focal weakness, weakness and headaches.  Endo/Heme/Allergies:  Does not bruise/bleed easily.  Psychiatric/Behavioral:  Negative for depression. The patient is not nervous/anxious and does not have insomnia.     MEDICAL HISTORY:  Past Medical History:  Diagnosis Date   Cancer  [facility])    Degenerative arthritis of knee    Depression    Diverticulitis    Glaucoma    since 2004   Hernia 1979   History of kidney stones    Hypertension    Personal history of tobacco use, presenting hazards to health    Pre-diabetes    Screening for obesity    Special screening for malignant neoplasms, colon    Wears dentures    full upper and lower    SURGICAL HISTORY: Past Surgical History:  Procedure Laterality Date   [place], 2012   lumbar bulging disc   CATARACT EXTRACTION W/PHACO Left 01/20/2021   Procedure: CATARACT EXTRACTION PHACO AND INTRAOCULAR LENS PLACEMENT ([place]) LEFT;  Surgeon: Birder Robson, MD;  Location: [place];  Service: Ophthalmology;  Laterality: Left;  10.13 0:52.1   COLONOSCOPY  0932,6712   [facility]/Dr. Buelah Manis sessile polyp,13m, found in rectum,multiple diverticula were found in the sigmoid, in descending and in transverse colon   COLOSTOMY  04-20-13   HERNIA REPAIR  1979   inguinal   IR IMAGING GUIDED PORT INSERTION  06/10/2021   JOINT REPLACEMENT Right    knee   KNEE ARTHROPLASTY Right 05/08/2018   Procedure: COMPUTER ASSISTED TOTAL KNEE ARTHROPLASTY;  Surgeon: HDereck Leep MD;  Location: [facility];  Service: Orthopedics;  Laterality: Right;   KNEE ARTHROPLASTY Left 11/16/2019   Procedure: COMPUTER ASSISTED TOTAL KNEE ARTHROPLASTY;  Surgeon: HDereck Leep MD;  Location: [facility];  Service: Orthopedics;  Laterality: Left;   LAPAROTOMY  04-20-13   colon resection with colosotmy   LITHOTRIPSY  2013   TONSILLECTOMY AND ADENOIDECTOMY  1962    SOCIAL HISTORY: Social History   Socioeconomic History   Marital status: Single    Spouse name: Not on file   Number of children: Not on file   Years of education: Not on file   Highest education level: Not on file  Occupational History   Not on file  Tobacco Use   Smoking status: Every Day    Packs/day: 1.00    Years: 20.00    Pack years: 20.00    Types: Cigarettes    Smokeless tobacco: Never   Tobacco comments:    since age [id number] Vaping Use   Vaping Use: Never used  Substance and Sexual Activity   Alcohol use: Yes    Comment: occassional   Drug use: Yes    Types: Marijuana   Sexual activity: Not on file  Other Topics Concern   Not on file  Social History Narrative   15 mins /[place] smoking; not much alcohol. Worked for [employer] 2019. .Marland Kitchen   Social Determinants of Health   Financial Resource Strain: Not on file  Food Insecurity: Not on file  Transportation Needs: Not on file  Physical Activity: Not on file  Stress: Not on file  Social Connections: Not on file  Intimate Partner Violence: Not on file    FAMILY HISTORY: Family History  Problem Relation Age of Onset   Diabetes Mother    Heart disease Mother    Heart attack Father    Prostate cancer Father     ALLERGIES:  is allergic to paclitaxel, ambien [zolpidem], and aleve [naproxen sodium].  MEDICATIONS:  Current Outpatient Medications  Medication Sig Dispense Refill   acetaminophen (TYLENOL) 500 MG tablet Take 1,000 mg by mouth daily.     albuterol (VENTOLIN HFA) 108 (90 Base) MCG/ACT inhaler Inhale 2 puffs into the lungs every 6 (six) hours as needed for wheezing or shortness of breath. 1 each 2   blood glucose meter kit and supplies KIT Check your blood glucose levels twice a day; once in the morning before breakfast; and once in the evening after dinner. 1 each 0   dexamethasone (DECADRON) 4 MG tablet Take 2 tablets (8 mg total) by mouth daily. Start the day after chemotherapy for 2 days. 30 tablet 1   diphenhydramine-acetaminophen (TYLENOL PM) 25-500 MG TABS Take 1-2 tablets by mouth at bedtime as needed (sleep.).      dorzolamide-timolol (COSOPT) 22.3-6.8 MG/ML ophthalmic solution Place 1 drop into both eyes 2 (two) times daily.     glipiZIDE (GLUCOTROL) 5 MG tablet Take 1 tablet (5 mg total) by mouth daily before breakfast. 30 tablet 1   glucose  blood test strip Check your blood glucose levels twice a day; once in the morning before breakfast; and once in the evening after dinner. 100 each 12   Lancets (FREESTYLE) lancets Check your blood glucose levels twice a day; once in the morning before breakfast; and once in the evening after dinner. 100 each 12   latanoprost (XALATAN) 0.005 % ophthalmic solution Place 1 drop into both eyes at bedtime.     lidocaine-prilocaine (EMLA) cream Apply to affected area once 30 g 3   montelukast (SINGULAIR) 10 MG tablet Take 1 tablet (10 mg total) by mouth at bedtime. 30 tablet 0   nicotine (NICODERM CQ - DOSED IN MG/24 HOURS) 21 mg/24hr patch Place 1 patch (21 mg total) onto the skin daily. 28 patch 1   olmesartan (BENICAR) 40 MG tablet Take 40 mg by mouth daily.     ondansetron (ZOFRAN) 8 MG tablet Take 1 tablet (8 mg total) by mouth 2 (two) times daily as needed for refractory nausea / vomiting. Start on day 3 after chemo. 30 tablet 1   prochlorperazine (COMPAZINE) 10 MG tablet TAKE 1 TABLET(10 MG) BY MOUTH EVERY 6 HOURS AS NEEDED FOR NAUSEA OR VOMITING 30 tablet 1   venlafaxine XR (EFFEXOR-XR) 75 MG 24 hr capsule Take 225 mg by mouth daily.     Baclofen 5 MG TABS TAKE 1 TABLET(5 MG) BY MOUTH EVERY NIGHT AS NEEDED FOR PAIN (Patient not taking: Reported on 07/27/2021)     cetirizine (ZYRTEC) 10 MG tablet Take 10 mg by mouth daily. (Patient not taking: Reported on 07/27/2021)     Cyanocobalamin (VITAMIN B-12 PO) Take by mouth daily. (Patient not taking: Reported on 07/27/2021)     guaiFENesin (MUCINEX) 600 MG 12 hr tablet Take by mouth 2 (two) times daily as needed. (Patient not taking: Reported on 07/27/2021)     sucralfate (CARAFATE) 1 g tablet Take 1 tablet (1 g total) by mouth 3 (three) times daily. Dissolve in 3-4 tbsp warm water, swish and swallow (Patient not taking: Reported on 07/27/2021) 90 tablet 1   traMADol (ULTRAM) 50 MG tablet TAKE 1 TABLET(50 MG) BY MOUTH EVERY 8 HOURS AS NEEDED 60 tablet 0    traZODone (DESYREL) 50 MG tablet TAKE 1 TABLET(50 MG) BY MOUTH AT BEDTIME AS NEEDED FOR SLEEP (Patient not taking: Reported on 07/27/2021) 30 tablet 1   No current facility-administered medications for this visit.   Facility-Administered Medications Ordered in Other Visits  Medication Dose  Route Frequency Provider Last Rate Last Admin   CARBOplatin (PARAPLATIN) 230 mg in sodium chloride 0.9 % 250 mL chemo infusion  230 mg Intravenous Once Charlaine Dalton R, MD       heparin lock flush 100 UNIT/ML injection            heparin lock flush 100 UNIT/ML injection            heparin lock flush 100 unit/mL  500 Units Intracatheter Once PRN [name], MD       PACLitaxel-protein bound (ABRAXANE) chemo infusion 50 mg  25 mg/m2 (Treatment Plan Recorded) Intravenous Once [name], MD          .  PHYSICAL EXAMINATION: ECOG PERFORMANCE STATUS: 1 - Symptomatic but completely ambulatory  Vitals:   07/27/21 0855  BP: 123/69  Pulse: (!) 102  Resp: 16  Temp: 98.7 F (37.1 C)  SpO2: 98%    Filed Weights   07/27/21 0855  Weight: 193 lb 3.2 oz (87.6 kg)     Physical Exam Vitals and nursing note reviewed.  HENT:     Head: Normocephalic and atraumatic.     Mouth/Throat:     Pharynx: Oropharynx is clear.  Eyes:     Extraocular Movements: Extraocular movements intact.     Pupils: Pupils are equal, round, and reactive to light.  Cardiovascular:     Rate and Rhythm: Normal rate and regular rhythm.  Pulmonary:     Comments: Decreased breath sounds bilaterally.  Abdominal:     Palpations: Abdomen is soft.  Musculoskeletal:        General: Normal range of motion.     Cervical back: Normal range of motion.  Skin:    General: Skin is warm.  Neurological:     General: No focal deficit present.     Mental Status: He is alert and oriented to person, place, and time.  Psychiatric:        Behavior: Behavior normal.        Judgment: Judgment normal.      LABORATORY DATA:  I have reviewed the data as listed Lab Results  Component Value Date   WBC 3.5 (L) 07/27/2021   HGB 9.8 (L) 07/27/2021   HCT 28.0 (L) 07/27/2021   MCV 90.9 07/27/2021   PLT 216 07/27/2021   Recent Labs    07/13/21 0822 07/20/21 0827 07/27/21 0833  NA 135 134* 132*  K 3.5 3.6 3.7  CL 101 100 99  CO2 _0 GLUCOSE 154* 246* 213*  BUN _1 CREATININE 0.87 1.00 0.91  CALCIUM 8.6* 8.6* 8.9  GFRNONAA >60 >60 >60  PROT 7.1 7.1 8.0  ALBUMIN 3.3* 3.2* 3.4*  AST _2 ALT _3 ALKPHOS 77 76 78  BILITOT 0.5 0.3 0.4    RADIOGRAPHIC STUDIES: I have personally reviewed the radiological images as listed and agreed with the findings in the report. No results found.  ASSESSMENT & PLAN:   Primary cancer of right upper lobe of lung ([place]) #Right upper lobe lung mass -concerning for malignancy.  8.2 x 5.9 cm -extends from the right hilum to the lateral chest wall and results in lytic destruction of the lateral portion of the right fourth rib; also noted to have mediastinal adenopathy/hilar lymph nodes; and a 3 cm adrenal mass on the right. SEP 2022 MRI of the brain-WNL.  August 2022-PET scan above. lung findings; adrenal lesion non-avid.Biopsy unfortunately not definitive-however  atypical cells suggestive of malignancy noted-no necrosis. STABLE.   # continue carbo-abraxane with RT [last RT 10/28]; Labs today reviewed;  acceptable for treatment today.  Plan 1 more chemotherapy/next week]; will scan after 1 month.   # PN-G-1-2 sec to chemo- monitor for now.   #Right chest wall pain-on Tylenol/tramadol [Qhs-2; 5-6 tramadol a day]- STABLE; worse with sneezing-recommend Singulair/Claritin.   # COPD:  continue  Prn albuterol.- STABLE  #Diabetes- poorly controlled sec to steroids-213 at home blood sugar levels- 114; -213-in AM  glipizide 5 mg  In AM.   # Smoking: Counseled regarding quitting smoking; currentlly putting the patch on. STABLE  # IV  mediport: functioning/STABLE  # DISPOSITION: # Chemo today # 1 week- labs- cbc/cmo;carbo-abraxane # follow up in 3 weeks- MD- labs- cbc/cmp; no chemo-Dr.B  All questions were answered. The patient knows to call the clinic with any problems, questions or concerns.    [name], MD 07/27/2021 10:16 AM

## 2021-07-27 NOTE — Patient Instructions (Signed)
[facility] ONCOLOG[facility]  Discharge Instructions: Thank you for choosing [facility] to provide your oncology and hematology care.  If you have a lab appointment with the [facility], please go directly to the [facility] and check in at the registration area.  Wear comfortable clothing and clothing appropriate for easy access to any Portacath or PICC line.   We strive to give you quality time with your provider. You may need to reschedule your appointment if you arrive late (15 or more minutes).  Arriving late affects you and other patients whose appointments are after yours.  Also, if you miss three or more appointments without notifying the office, you may be dismissed from the clinic at the provider's discretion.      For prescription refill requests, have your pharmacy contact our office and allow 72 hours for refills to be completed.    Today you received the following chemotherapy and/or immunotherapy agents     To help prevent nausea and vomiting after your treatment, we encourage you to take your nausea medication as directed.  BELOW ARE SYMPTOMS THAT SHOULD BE REPORTED IMMEDIATELY: *FEVER GREATER THAN 100.4 F (38 C) OR HIGHER *CHILLS OR SWEATING *NAUSEA AND VOMITING THAT IS NOT CONTROLLED WITH YOUR NAUSEA MEDICATION *UNUSUAL SHORTNESS OF BREATH *UNUSUAL BRUISING OR BLEEDING *URINARY PROBLEMS (pain or burning when urinating, or frequent urination) *BOWEL PROBLEMS (unusual diarrhea, constipation, pain near the anus) TENDERNESS IN MOUTH AND THROAT WITH OR WITHOUT PRESENCE OF ULCERS (sore throat, sores in mouth, or a toothache) UNUSUAL RASH, SWELLING OR PAIN  UNUSUAL VAGINAL DISCHARGE OR ITCHING   Items with * indicate a potential emergency and should be followed up as soon as possible or go to the Emergency Department if any problems should occur.  Please show the CHEMOTHERAPY ALERT CARD or IMMUNOTHERAPY ALERT CARD at check-in to the  Emergency Department and triage nurse.  Should you have questions after your visit or need to cancel or reschedule your appointment, please contact [facility]  [phone] and follow the prompts.  Office hours are 8:00 a.m. to 4:30 p.m. Monday - Friday. Please note that voicemails left after 4:00 p.m. may not be returned until the following business day.  We are closed weekends and major holidays. You have access to a nurse at all times for urgent questions. Please call the main number to the clinic [phone] and follow the prompts.  For any non-urgent questions, you may also contact your provider using MyChart. We now offer e-Visits for anyone 72 and older to request care online for non-urgent symptoms. For details visit [email or web address].   Also download the MyChart app! Go to the app store, search "MyChart", open the app, select [facility], and log in with your MyChart username and password.  Due to Covid, a mask is required upon entering the hospital/clinic. If you do not have a mask, one will be given to you upon arrival. For doctor visits, patients may have 1 support person aged 50 or older with them. For treatment visits, patients cannot have anyone with them due to current Covid guidelines and our immunocompromised population.

## 2021-07-27 NOTE — Progress Notes (Signed)
Pt will like to talk about tingling below his knee and feeling off balance at timess; as well as is he able to take tylenol sinus with his current medictions.

## 2021-07-27 NOTE — Assessment & Plan Note (Addendum)
#  Right upper lobe lung mass -concerning for malignancy.  8.2 x 5.9 cm -extends from the right hilum to the lateral chest wall and results in lytic destruction of the lateral portion of the right fourth rib; also noted to have mediastinal adenopathy/hilar lymph nodes; and a 3 cm adrenal mass on the right. SEP 2022 MRI of the brain-WNL.  August 2022-PET scan above. lung findings; adrenal lesion non-avid.Biopsy unfortunately not definitive-however atypical cells suggestive of malignancy noted-no necrosis. STABLE.   # continue carbo-abraxane with RT [last RT 10/28]; Labs today reviewed;  acceptable for treatment today.  Plan 1 more chemotherapy/next week]; will scan after 1 month.   # PN-G-1-2 sec to chemo- monitor for now.   #Right chest wall pain-on Tylenol/tramadol [Qhs-2; 5-6 tramadol a day]- STABLE; worse with sneezing-recommend Singulair/Claritin.   # COPD:  continue  Prn albuterol.- STABLE  #Diabetes- poorly controlled sec to steroids-213 at home blood sugar levels- 114; -213-in AM  glipizide 5 mg  In AM.   # Smoking: Counseled regarding quitting smoking; currentlly putting the patch on. STABLE  # IV mediport: functioning/STABLE  # DISPOSITION: # Chemo today # 1 week- labs- cbc/cmo;carbo-abraxane # follow up in 3 weeks- MD- labs- cbc/cmp; no chemo-Dr.B

## 2021-07-28 ENCOUNTER — Ambulatory Visit
Admission: RE | Admit: 2021-07-28 | Discharge: 2021-07-28 | Disposition: A | Discharge status: Home / Self Care | Payer: Medicare PPO | Source: Ambulatory Visit | Attending: Radiation Oncology | Admitting: Radiation Oncology

## 2021-07-28 DIAGNOSIS — Z510 Encounter for antineoplastic radiation therapy: Secondary | ICD-10-CM | POA: Diagnosis not present

## 2021-07-29 ENCOUNTER — Ambulatory Visit
Admission: RE | Admit: 2021-07-29 | Discharge: 2021-07-29 | Disposition: A | Discharge status: Home / Self Care | Payer: Medicare PPO | Source: Ambulatory Visit | Attending: Radiation Oncology | Admitting: Radiation Oncology

## 2021-07-29 DIAGNOSIS — Z510 Encounter for antineoplastic radiation therapy: Secondary | ICD-10-CM | POA: Diagnosis not present

## 2021-07-30 ENCOUNTER — Inpatient Hospital Stay: Payer: Medicare PPO

## 2021-07-30 ENCOUNTER — Ambulatory Visit
Admission: RE | Admit: 2021-07-30 | Discharge: 2021-07-30 | Disposition: A | Discharge status: Home / Self Care | Payer: Medicare PPO | Source: Ambulatory Visit | Attending: Radiation Oncology | Admitting: Radiation Oncology

## 2021-07-30 DIAGNOSIS — Z510 Encounter for antineoplastic radiation therapy: Secondary | ICD-10-CM | POA: Diagnosis not present

## 2021-07-30 NOTE — Progress Notes (Signed)
Nutrition Follow-up:  Patient with right upper lobe mass.  Patient receiving chemotherapy and radiation.    Met with patient following radiation.  Had an event where "leg would not work right" during radiation.  Blood pressure checked prior to coming to RD.  Patient says that he feels weak and did not eat as well yesterday.  Can't remember what he ate.  Says he ate 2 eggs and sausage this am before coming to radiation.  Says that he is drinking fluids.  Has not been drinking shakes/oral nutrition supplements very much recently.     Anthropometrics:   Weight 193 lb 3.2 oz on 10/24  195 lb on 10/10 197 lb on 9/26   NUTRITION DIAGNOSIS:  Inadequate oral intake continues   INTERVENTION:  Nursing monitoring patient following RD visit as well.  Patient agreed to start drinking oral nutrition supplements again to give more calorie and protein.  Encouraged fluid for hydration.     MONITORING, EVALUATION, GOAL: weight trends, intake   NEXT VISIT: telephone visit on Thursday, Nov 17  Rodney Vega, [place], [place] Registered Dietitian [phone] (mobile)

## 2021-07-31 ENCOUNTER — Ambulatory Visit
Admission: RE | Admit: 2021-07-31 | Discharge: 2021-07-31 | Disposition: A | Discharge status: Home / Self Care | Payer: Medicare PPO | Source: Ambulatory Visit | Attending: Radiation Oncology | Admitting: Radiation Oncology

## 2021-07-31 DIAGNOSIS — Z510 Encounter for antineoplastic radiation therapy: Secondary | ICD-10-CM | POA: Diagnosis not present

## 2021-08-03 ENCOUNTER — Inpatient Hospital Stay: Payer: Medicare PPO

## 2021-08-03 ENCOUNTER — Other Ambulatory Visit: Payer: Self-pay

## 2021-08-03 VITALS — BP 130/68 | HR 100 | Temp 98.2°F | Resp 18 | Wt 192.0 lb

## 2021-08-03 DIAGNOSIS — Z510 Encounter for antineoplastic radiation therapy: Secondary | ICD-10-CM | POA: Diagnosis not present

## 2021-08-03 DIAGNOSIS — C3490 Malignant neoplasm of unspecified part of unspecified bronchus or lung: Secondary | ICD-10-CM

## 2021-08-03 DIAGNOSIS — C3411 Malignant neoplasm of upper lobe, right bronchus or lung: Secondary | ICD-10-CM

## 2021-08-03 LAB — COMPREHENSIVE METABOLIC PANEL
ALT: 15 U/L (ref 0–44)
AST: 20 U/L (ref 15–41)
Albumin: 3.2 g/dL — ABNORMAL LOW (ref 3.5–5.0)
Alkaline Phosphatase: 78 U/L (ref 38–126)
Anion gap: 11 (ref 5–15)
BUN: 14 mg/dL (ref 8–23)
CO2: 26 mmol/L (ref 22–32)
Calcium: 8.7 mg/dL — ABNORMAL LOW (ref 8.9–10.3)
Chloride: 98 mmol/L (ref 98–111)
Creatinine, Ser: 1 mg/dL (ref 0.61–1.24)
GFR, Estimated: >60 mL/min (ref >60)
Glucose, Bld: 255 mg/dL — ABNORMAL HIGH (ref 70–99)
Potassium: 3.3 mmol/L — ABNORMAL LOW (ref 3.5–5.1)
Sodium: 135 mmol/L (ref 135–145)
Total Bilirubin: 0.5 mg/dL (ref 0.3–1.2)
Total Protein: 7.4 g/dL (ref 6.5–8.1)

## 2021-08-03 LAB — CBC WITH DIFFERENTIAL/PLATELET
Abs Immature Granulocytes: 0.07 10*3/uL (ref 0.00–0.07)
Basophils Absolute: 0.1 10*3/uL (ref 0.0–0.1)
Basophils Relative: 1 %
Eosinophils Absolute: 0 10*3/uL (ref 0.0–0.5)
Eosinophils Relative: 0 %
HCT: 27.7 % — ABNORMAL LOW (ref 39.0–52.0)
Hemoglobin: 9.5 g/dL — ABNORMAL LOW (ref 13.0–17.0)
Immature Granulocytes: 1 %
Lymphocytes Relative: 12 %
Lymphs Abs: 0.7 10*3/uL (ref 0.7–4.0)
MCH: 31.5 pg (ref 26.0–34.0)
MCHC: 34.3 g/dL (ref 30.0–36.0)
MCV: 91.7 fL (ref 80.0–100.0)
Monocytes Absolute: 0.7 10*3/uL (ref 0.1–1.0)
Monocytes Relative: 13 %
Neutro Abs: 4 10*3/uL (ref 1.7–7.7)
Neutrophils Relative %: 73 %
Platelets: 285 10*3/uL (ref 150–400)
RBC: 3.02 MIL/uL — ABNORMAL LOW (ref 4.22–5.81)
RDW: 16.6 % — ABNORMAL HIGH (ref 11.5–15.5)
WBC: 5.6 10*3/uL (ref 4.0–10.5)
nRBC: 0 % (ref 0.0–0.2)

## 2021-08-03 MED ORDER — SODIUM CHLORIDE 0.9 % IV SOLN
230.0000 mg | Freq: Once | INTRAVENOUS | Status: AC
  Administered 2021-08-03: 230 mg via INTRAVENOUS
  Filled 2021-08-03: qty 23

## 2021-08-03 MED ORDER — PALONOSETRON HCL INJECTION 0.25 MG/5ML
0.2500 mg | Freq: Once | INTRAVENOUS | Status: AC
  Administered 2021-08-03: 0.25 mg via INTRAVENOUS
  Filled 2021-08-03: qty 5

## 2021-08-03 MED ORDER — HEPARIN SOD (PORK) LOCK FLUSH 100 UNIT/ML IV SOLN
500.0000 [IU] | Freq: Once | INTRAVENOUS | Status: AC | PRN
  Administered 2021-08-03: 500 [IU]
  Filled 2021-08-03: qty 5

## 2021-08-03 MED ORDER — SODIUM CHLORIDE 0.9 % IV SOLN
Freq: Once | INTRAVENOUS | Status: AC
  Filled 2021-08-03: qty 250

## 2021-08-03 MED ORDER — PACLITAXEL PROTEIN-BOUND CHEMO INJECTION 100 MG
25.0000 mg/m2 | Freq: Once | INTRAVENOUS | Status: AC
  Administered 2021-08-03: 50 mg via INTRAVENOUS
  Filled 2021-08-03: qty 10

## 2021-08-03 MED ORDER — SODIUM CHLORIDE 0.9 % IV SOLN
10.0000 mg | Freq: Once | INTRAVENOUS | Status: AC
  Administered 2021-08-03: 10 mg via INTRAVENOUS
  Filled 2021-08-03: qty 10

## 2021-08-03 MED ORDER — HEPARIN SOD (PORK) LOCK FLUSH 100 UNIT/ML IV SOLN
INTRAVENOUS | Status: AC
  Filled 2021-08-03: qty 5

## 2021-08-07 ENCOUNTER — Telehealth: Payer: Self-pay | Admitting: *Deleted

## 2021-08-07 ENCOUNTER — Other Ambulatory Visit: Payer: Self-pay | Admitting: Internal Medicine

## 2021-08-07 MED ORDER — PREGABALIN 50 MG PO CAPS: 50.0000 mg | ORAL_CAPSULE | Freq: Two times a day (BID) | ORAL | 1 refills | Status: DC

## 2021-08-07 NOTE — Telephone Encounter (Signed)
Pt's wife left message stating that pt's peripheral neuropathy is getting worse and is wondering if he could get something called in for it. States that they have previously discussed starting Lyrica with Dr. B at last visit.   Please advise.

## 2021-08-07 NOTE — Telephone Encounter (Signed)
Md sent lyrica prescription to [facility]. Called patient at 1614-left detailed vm

## 2021-08-17 ENCOUNTER — Other Ambulatory Visit: Payer: Self-pay

## 2021-08-17 ENCOUNTER — Encounter: Payer: Self-pay | Admitting: Internal Medicine

## 2021-08-17 ENCOUNTER — Inpatient Hospital Stay (HOSPITAL_BASED_OUTPATIENT_CLINIC_OR_DEPARTMENT_OTHER): Payer: Medicare PPO | Admitting: Internal Medicine

## 2021-08-17 ENCOUNTER — Inpatient Hospital Stay: Payer: Medicare PPO | Attending: Internal Medicine

## 2021-08-17 DIAGNOSIS — R531 Weakness: Secondary | ICD-10-CM | POA: Insufficient documentation

## 2021-08-17 DIAGNOSIS — Z8042 Family history of malignant neoplasm of prostate: Secondary | ICD-10-CM | POA: Insufficient documentation

## 2021-08-17 DIAGNOSIS — I10 Essential (primary) hypertension: Secondary | ICD-10-CM | POA: Insufficient documentation

## 2021-08-17 DIAGNOSIS — Z923 Personal history of irradiation: Secondary | ICD-10-CM | POA: Insufficient documentation

## 2021-08-17 DIAGNOSIS — J069 Acute upper respiratory infection, unspecified: Secondary | ICD-10-CM | POA: Diagnosis not present

## 2021-08-17 DIAGNOSIS — G620 Drug-induced polyneuropathy: Secondary | ICD-10-CM | POA: Diagnosis not present

## 2021-08-17 DIAGNOSIS — N132 Hydronephrosis with renal and ureteral calculous obstruction: Secondary | ICD-10-CM | POA: Insufficient documentation

## 2021-08-17 DIAGNOSIS — Z79899 Other long term (current) drug therapy: Secondary | ICD-10-CM | POA: Insufficient documentation

## 2021-08-17 DIAGNOSIS — T451X5A Adverse effect of antineoplastic and immunosuppressive drugs, initial encounter: Secondary | ICD-10-CM | POA: Diagnosis not present

## 2021-08-17 DIAGNOSIS — R5383 Other fatigue: Secondary | ICD-10-CM | POA: Insufficient documentation

## 2021-08-17 DIAGNOSIS — Z9221 Personal history of antineoplastic chemotherapy: Secondary | ICD-10-CM | POA: Diagnosis not present

## 2021-08-17 DIAGNOSIS — D649 Anemia, unspecified: Secondary | ICD-10-CM | POA: Diagnosis not present

## 2021-08-17 DIAGNOSIS — Z7984 Long term (current) use of oral hypoglycemic drugs: Secondary | ICD-10-CM | POA: Insufficient documentation

## 2021-08-17 DIAGNOSIS — E1165 Type 2 diabetes mellitus with hyperglycemia: Secondary | ICD-10-CM | POA: Diagnosis not present

## 2021-08-17 DIAGNOSIS — F17210 Nicotine dependence, cigarettes, uncomplicated: Secondary | ICD-10-CM | POA: Diagnosis not present

## 2021-08-17 DIAGNOSIS — C3411 Malignant neoplasm of upper lobe, right bronchus or lung: Secondary | ICD-10-CM | POA: Insufficient documentation

## 2021-08-17 DIAGNOSIS — Z87442 Personal history of urinary calculi: Secondary | ICD-10-CM | POA: Insufficient documentation

## 2021-08-17 DIAGNOSIS — C3490 Malignant neoplasm of unspecified part of unspecified bronchus or lung: Secondary | ICD-10-CM

## 2021-08-17 LAB — CBC WITH DIFFERENTIAL/PLATELET
Abs Immature Granulocytes: 0.07 10*3/uL (ref 0.00–0.07)
Basophils Absolute: 0.1 10*3/uL (ref 0.0–0.1)
Basophils Relative: 1 %
Eosinophils Absolute: 0.1 10*3/uL (ref 0.0–0.5)
Eosinophils Relative: 1 %
HCT: 26.6 % — ABNORMAL LOW (ref 39.0–52.0)
Hemoglobin: 8.8 g/dL — ABNORMAL LOW (ref 13.0–17.0)
Immature Granulocytes: 1 %
Lymphocytes Relative: 9 %
Lymphs Abs: 0.9 10*3/uL (ref 0.7–4.0)
MCH: 31.5 pg (ref 26.0–34.0)
MCHC: 33.1 g/dL (ref 30.0–36.0)
MCV: 95.3 fL (ref 80.0–100.0)
Monocytes Absolute: 1 10*3/uL (ref 0.1–1.0)
Monocytes Relative: 11 %
Neutro Abs: 7 10*3/uL (ref 1.7–7.7)
Neutrophils Relative %: 77 %
Platelets: 236 10*3/uL (ref 150–400)
RBC: 2.79 MIL/uL — ABNORMAL LOW (ref 4.22–5.81)
RDW: 17.5 % — ABNORMAL HIGH (ref 11.5–15.5)
WBC: 9 10*3/uL (ref 4.0–10.5)
nRBC: 0 % (ref 0.0–0.2)

## 2021-08-17 LAB — COMPREHENSIVE METABOLIC PANEL
ALT: 21 U/L (ref 0–44)
AST: 22 U/L (ref 15–41)
Albumin: 2.9 g/dL — ABNORMAL LOW (ref 3.5–5.0)
Alkaline Phosphatase: 97 U/L (ref 38–126)
Anion gap: 9 (ref 5–15)
BUN: 13 mg/dL (ref 8–23)
CO2: 26 mmol/L (ref 22–32)
Calcium: 9 mg/dL (ref 8.9–10.3)
Chloride: 100 mmol/L (ref 98–111)
Creatinine, Ser: 0.9 mg/dL (ref 0.61–1.24)
GFR, Estimated: >60 mL/min (ref >60)
Glucose, Bld: 124 mg/dL — ABNORMAL HIGH (ref 70–99)
Potassium: 3.6 mmol/L (ref 3.5–5.1)
Sodium: 135 mmol/L (ref 135–145)
Total Bilirubin: 0.1 mg/dL — ABNORMAL LOW (ref 0.3–1.2)
Total Protein: 7.8 g/dL (ref 6.5–8.1)

## 2021-08-17 MED ORDER — HEPARIN SOD (PORK) LOCK FLUSH 100 UNIT/ML IV SOLN
500.0000 [IU] | Freq: Once | INTRAVENOUS | Status: AC
  Administered 2021-08-17: 500 [IU] via INTRAVENOUS
  Filled 2021-08-17: qty 5

## 2021-08-17 MED ORDER — SODIUM CHLORIDE 0.9% FLUSH
10.0000 mL | INTRAVENOUS | Status: DC | PRN
  Administered 2021-08-17: 10 mL via INTRAVENOUS
  Filled 2021-08-17: qty 10

## 2021-08-17 NOTE — Assessment & Plan Note (Addendum)
#  Right upper lobe lung mass -concerning for malignancy.  8.2 x 5.9 cm -extends from the right hilum to the lateral chest wall and results in lytic destruction of the lateral portion of the right fourth rib; also noted to have mediastinal adenopathy/hilar lymph nodes; and a 3 cm adrenal mass on the right. SEP 2022 MRI of the brain-WNL.  August 2022-PET scan above. lung findings; adrenal lesion non-avid.Biopsy unfortunately not definitive-however atypical cells suggestive of malignancy noted-no necrosis. STABLE.   # s/p  carbo-abraxane with RT [last RT 10/28]; Labs today reviewed;  acceptable for treatment today.  Discussed that we will get a scan in approximately 3 weeks or so.  Discussed that patient will need likely chemotherapy; however await results of the CT scan.  We will discuss further at next visit.  # PN-G-1-2 sec to chemo- monitor for now.   #Right chest wall pain-on Tylenol/tramadol [Qhs-2; 5-6 tramadol a day]- STABLE; worse with sneezing-recommend Singulair/Claritin.   # URI- on doxy [PCP]-STABLE.   # COPD:  continue  Prn albuterol.- STABLE  #Diabetes- poorly controlled sec to steroids-213 at home blood sugar levels- 114; -213-in AM  glipizide 5 mg  In AM.   # Smoking: Counseled regarding quitting smoking; currentlly putting the patch on. STABLE  # IV mediport: functioning/STABLE  # DISPOSITION:appt thru mychart # follow up dec 5th week;MD- labs- cbc/cmp; no chemo; CT scan prior--Dr.B

## 2021-08-17 NOTE — Progress Notes (Signed)
Rodney Vega NOTE  Patient Care Team: Rodney Pink, MD as PCP - General (Family Medicine) Rodney Lye, MD as Consulting Physician (General Surgery) Rodney Nab, [name]    Oncology History Overview Note  AUG 2022- 8.2 x 5.9 cm spiculated mass noted in right upper lobe consistent with malignancy. It extends from the right hilum to the lateral chest wall and results in lytic destruction of the lateral portion of the right fourth rib.  Mediastinal lymph nodes are noted, including 12 mm right hilar lymph node, consistent with metastatic disease. 3 cm right adrenal mass is noted concerning for metastatic disease.  # AUG 2022- PET IMPRESSION: Peripherally hypermetabolic right upper lobe mass, likely due to central necrosis. Mass extends into the right hilum and right lateral chest wall involving the second through fourth right ribs.   Mildly enlarged and hypermetabolic right hilar lymph node.   No evidence of metastatic disease in the abdomen or pelvis.  # AUG, 2022-RIGHT CHEST WALL Bx-necrosis; suspicious for malignancy not definitive [discussed with Rodney Vega;MDT] LUNG MASS, RIGHT; BIOPSY:  - SUSPICIOUS FOR MALIGNANCY.  - SEE COMMENT.   Comment:  Approximately six tissue cores demonstrate inflamed fibrous capsule with  hemosiderin-laden macrophages, in a background of abundant necrosis.  One of the cores demonstrates a minute fragment of viable epithelial  cells.  These cells are positive for p40.  They are negative for TTF1  and CK7. The cells of interest disappear on deeper cut levels.  While  the findings are suspicious for squamous cell carcinoma, there is not  enough viable tumor for a definitive diagnosis.  Additional tissue  sampling may be helpful.   # SEP 2022-cycle #2 Taxol hypersensitive reaction.  Switched over to #3 cycle- carbo-Abraxane starting  06/29/2021   Primary cancer of right upper lobe of lung ([place])  06/03/2021 Initial Diagnosis   Primary cancer of right upper lobe of lung ([place])   06/03/2021 Cancer Staging   Staging form: Lung, AJCC 8th Edition - Clinical: Stage IIIB (cT3, cN2, cM0) - Signed by Rodney Sickle, MD on 06/03/2021    06/15/2021 -  Chemotherapy   Patient is on Treatment Plan : LUNG Carboplatin / Paclitaxel + XRT q7d        HISTORY OF PRESENTING ILLNESS: Ambulating independently.  Accompanied by wife.  Rodney Vega 65 y.o.  male history of smoking -"lung cancer" [Limited necrotic tissue]-stage III unresectable currently on chemoradiation- carbo-abraxane is here for follow-up.  In the interim patient was evaluated by PCP for low-grade fever/sinus pressure.  Currently on doxycycline.  Patient finished radiation approximately 3 weeks; ago.  He finished chemotherapy approximately 2 weeks ago.  Denies any worsening chest wall pain. Mild tingling and numbness in extremities.  Mild nausea improved with antiemetics.  No fever no chills.  No falls.  Review of Systems  Constitutional:  Positive for malaise/fatigue. Negative for chills, diaphoresis, fever and weight loss.  HENT:  Negative for nosebleeds and sore throat.   Eyes:  Negative for double vision.  Respiratory:  Positive for shortness of breath. Negative for cough, hemoptysis, sputum production and wheezing.   Cardiovascular:  Negative for chest pain, palpitations, orthopnea and leg swelling.  Gastrointestinal:  Positive for nausea. Negative for abdominal pain, blood in stool, constipation, diarrhea, heartburn, melena and vomiting.  Genitourinary:  Negative for dysuria, frequency and urgency.  Musculoskeletal:  Negative for back pain and joint pain.  Skin: Negative.  Negative  for itching and rash.  Neurological:  Positive for tingling. Negative for dizziness, focal weakness, weakness and headaches.  Endo/Heme/Allergies:  Does not bruise/bleed easily.   Psychiatric/Behavioral:  Negative for depression. The patient is not nervous/anxious and does not have insomnia.     MEDICAL HISTORY:  Past Medical History:  Diagnosis Date  . Cancer ([place])   . Degenerative arthritis of knee   . Depression   . Diverticulitis   . Glaucoma    since 2004  . Hernia 1979  . History of kidney stones   . Hypertension   . Personal history of tobacco use, presenting hazards to health   . Pre-diabetes   . Screening for obesity   . Special screening for malignant neoplasms, colon   . Wears dentures    full upper and lower    SURGICAL HISTORY: Past Surgical History:  Procedure Laterality Date  . [place], 2012   lumbar bulging disc  . CATARACT EXTRACTION W/PHACO Left 01/20/2021   Procedure: CATARACT EXTRACTION PHACO AND INTRAOCULAR LENS PLACEMENT ([place]) LEFT;  Surgeon: Rodney Robson, MD;  Location: [place] [facility];  Service: Ophthalmology;  Laterality: Left;  10.13 0:52.1  . COLONOSCOPY  [id number]   [facility]/Dr. Buelah Vega sessile polyp,70mm, found in rectum,multiple diverticula were found in the sigmoid, in descending and in transverse colon  . COLOSTOMY  04-20-13  . HERNIA REPAIR  1979   inguinal  . IR IMAGING GUIDED PORT INSERTION  06/10/2021  . JOINT REPLACEMENT Right    knee  . KNEE ARTHROPLASTY Right 05/08/2018   Procedure: COMPUTER ASSISTED TOTAL KNEE ARTHROPLASTY;  Surgeon: Rodney Leep, MD;  Location: [facility];  Service: Orthopedics;  Laterality: Right;  . KNEE ARTHROPLASTY Left 11/16/2019   Procedure: COMPUTER ASSISTED TOTAL KNEE ARTHROPLASTY;  Surgeon: Rodney Leep, MD;  Location: [facility];  Service: Orthopedics;  Laterality: Left;  . LAPAROTOMY  04-20-13   colon resection with colosotmy  . LITHOTRIPSY  2013  . TONSILLECTOMY AND ADENOIDECTOMY  1962    SOCIAL HISTORY: Social History   Socioeconomic History  . Marital status: Single    Spouse name: Not on file  . Number of children: Not on file  . Years of education: Not on  file  . Highest education level: Not on file  Occupational History  . Not on file  Tobacco Use  . Smoking status: Every Day    Packs/day: 1.00    Years: 20.00    Pack years: 20.00    Types: Cigarettes  . Smokeless tobacco: Never  . Tobacco comments:    since age 14 or 1  Vaping Use  . Vaping Use: Never used  Substance and Sexual Activity  . Alcohol use: Yes    Comment: occassional  . Drug use: Yes    Types: Marijuana  . Sexual activity: Not on file  Other Topics Concern  . Not on file  Social History Narrative   15 mins [place]; smoking; not much alcohol. Worked for [employer], 2019. 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 Diabetes Mother   . Heart disease Mother   . Heart attack Father   .  Prostate cancer Father     ALLERGIES:  is allergic to paclitaxel, ambien [zolpidem], and aleve [naproxen sodium].  MEDICATIONS:  Current Outpatient Medications  Medication Sig Dispense Refill  . acetaminophen (TYLENOL) 500 MG tablet Take 1,000 mg by mouth daily.    Marland Kitchen albuterol (VENTOLIN HFA) 108 (90 Base) MCG/ACT inhaler Inhale 2 puffs into the lungs every 6 (six) hours as needed for wheezing or shortness of breath. 1 each 2  . blood glucose meter kit and supplies KIT Check your blood glucose levels twice a day; once in the morning before breakfast; and once in the evening after dinner. 1 each 0  . cetirizine (ZYRTEC) 10 MG tablet Take 10 mg by mouth daily.    Marland Kitchen dexamethasone (DECADRON) 4 MG tablet Take 2 tablets (8 mg total) by mouth daily. Start the day after chemotherapy for 2 days. 30 tablet 1  . diphenhydramine-acetaminophen (TYLENOL PM) 25-500 MG TABS Take  1-2 tablets by mouth at bedtime as needed (sleep.).     Marland Kitchen dorzolamide-timolol (COSOPT) 22.3-6.8 MG/ML ophthalmic solution Place 1 drop into both eyes 2 (two) times daily.    Marland Kitchen doxycycline (VIBRA-TABS) 100 MG tablet Take by mouth.    Marland Kitchen glipiZIDE (GLUCOTROL) 5 MG tablet Take 1 tablet (5 mg total) by mouth daily before breakfast. 30 tablet 1  . glucose blood test strip Check your blood glucose levels twice a day; once in the morning before breakfast; and once in the evening after dinner. 100 each 12  . Lancets (FREESTYLE) lancets Check your blood glucose levels twice a day; once in the morning before breakfast; and once in the evening after dinner. 100 each 12  . latanoprost (XALATAN) 0.005 % ophthalmic solution Place 1 drop into both eyes at bedtime.    . lidocaine-prilocaine (EMLA) cream Apply to affected area once 30 g 3  . montelukast (SINGULAIR) 10 MG tablet Take 1 tablet (10 mg total) by mouth at bedtime. 30 tablet 0  . olmesartan (BENICAR) 40 MG tablet Take 40 mg by mouth daily.    . pregabalin (LYRICA) 50 MG capsule Take 1 capsule (50 mg total) by mouth 2 (two) times daily. 60 capsule 1  . venlafaxine XR (EFFEXOR-XR) 75 MG 24 hr capsule Take 225 mg by mouth daily.    . Baclofen 5 MG TABS TAKE 1 TABLET(5 MG) BY MOUTH EVERY NIGHT AS NEEDED FOR PAIN (Patient not taking: No sig reported)    . Cyanocobalamin (VITAMIN B-12 PO) Take by mouth daily. (Patient not taking: No sig reported)    . guaiFENesin (MUCINEX) 600 MG 12 hr tablet Take by mouth 2 (two) times daily as needed. (Patient not taking: No sig reported)    . nicotine (NICODERM CQ - DOSED IN MG/24 HOURS) 21 mg/24hr patch Place 1 patch (21 mg total) onto the skin daily. (Patient not taking: Reported on 08/17/2021) 28 patch 1  . ondansetron (ZOFRAN) 8 MG tablet Take 1 tablet (8 mg total) by mouth 2 (two) times daily as needed for refractory nausea / vomiting. Start on day 3 after chemo. (Patient not taking: Reported on 08/17/2021) 30 tablet 1   . prochlorperazine (COMPAZINE) 10 MG tablet TAKE 1 TABLET(10 MG) BY MOUTH EVERY 6 HOURS AS NEEDED FOR NAUSEA OR VOMITING (Patient not taking: Reported on 08/17/2021) 30 tablet 1  . sucralfate (CARAFATE) 1 g tablet Take 1 tablet (1 g total) by mouth 3 (three) times daily. Dissolve in 3-4 tbsp warm water, swish and swallow (Patient not taking: No sig reported)  90 tablet 1  . traMADol (ULTRAM) 50 MG tablet TAKE 1 TABLET(50 MG) BY MOUTH EVERY 8 HOURS AS NEEDED (Patient not taking: Reported on 08/17/2021) 60 tablet 0  . traZODone (DESYREL) 50 MG tablet TAKE 1 TABLET(50 MG) BY MOUTH AT BEDTIME AS NEEDED FOR SLEEP (Patient not taking: No sig reported) 30 tablet 1   No current facility-administered medications for this visit.   Facility-Administered Medications Ordered in Other Visits  Medication Dose Route Frequency Provider Last Rate Last Admin  . heparin lock flush 100 UNIT/ML injection           . heparin lock flush 100 UNIT/ML injection           . sodium chloride flush (NS) 0.9 % injection 10 mL  10 mL Intravenous PRN Rodney Sickle, MD   10 mL at 08/17/21 0923      .  PHYSICAL EXAMINATION: ECOG PERFORMANCE STATUS: 1 - Symptomatic but completely ambulatory  Vitals:   08/17/21 0939  BP: 117/72  Pulse: (!) 112  Resp: 18  Temp: 98.1 F (36.7 C)  SpO2: 100%    Filed Weights   08/17/21 0939  Weight: 190 lb 9.6 oz (86.5 kg)     Physical Exam Vitals and nursing note reviewed.  HENT:     Head: Normocephalic and atraumatic.     Mouth/Throat:     Pharynx: Oropharynx is clear.  Eyes:     Extraocular Movements: Extraocular movements intact.     Pupils: Pupils are equal, round, and reactive to light.  Cardiovascular:     Rate and Rhythm: Normal rate and regular rhythm.  Pulmonary:     Comments: Decreased breath sounds bilaterally.  Abdominal:     Palpations: Abdomen is soft.  Musculoskeletal:        General: Normal range of motion.     Cervical back: Normal range of  motion.  Skin:    General: Skin is warm.  Neurological:     General: No focal deficit present.     Mental Status: He is alert and oriented to person, place, and time.  Psychiatric:        Behavior: Behavior normal.        Judgment: Judgment normal.     LABORATORY DATA:  I have reviewed the data as listed Lab Results  Component Value Date   WBC 9.0 08/17/2021   HGB 8.8 (L) 08/17/2021   HCT 26.6 (L) 08/17/2021   MCV 95.3 08/17/2021   PLT 236 08/17/2021   Recent Labs    07/27/21 0833 08/03/21 0808 08/17/21 0920  NA 132* 135 135  K 3.7 3.3* 3.6  CL 99 98 100  CO2 $Re'27 26 26  'GUC$ GLUCOSE 213* 255* 124*  BUN $Re'11 14 13  'zEe$ CREATININE 0.91 1.00 0.90  CALCIUM 8.9 8.7* 9.0  GFRNONAA >60 >60 >60  PROT 8.0 7.4 7.8  ALBUMIN 3.4* 3.2* 2.9*  AST $Re'17 20 22  'Hnx$ ALT $R'16 15 21  'OW$ ALKPHOS 78 78 97  BILITOT 0.4 0.5 0.1*    RADIOGRAPHIC STUDIES: I have personally reviewed the radiological images as listed and agreed with the findings in the report. No results found.  ASSESSMENT & PLAN:   Primary cancer of right upper lobe of lung ([place]) #Right upper lobe lung mass -concerning for malignancy.  8.2 x 5.9 cm -extends from the right hilum to the lateral chest wall and results in lytic destruction of the lateral portion of the right fourth rib; also noted to have mediastinal adenopathy/hilar  lymph nodes; and a 3 cm adrenal mass on the right. SEP 2022 MRI of the brain-WNL.  August 2022-PET scan above. lung findings; adrenal lesion non-avid.Biopsy unfortunately not definitive-however atypical cells suggestive of malignancy noted-no necrosis. STABLE.   # s/p  carbo-abraxane with RT [last RT 10/28]; Labs today reviewed;  acceptable for treatment today.  Discussed that we will get a scan in approximately 3 weeks or so.  Discussed that patient will need likely chemotherapy; however await results of the CT scan.  We will discuss further at next visit.  # PN-G-1-2 sec to chemo- monitor for now.   #Right chest  wall pain-on Tylenol/tramadol [Qhs-2; 5-6 tramadol a day]- STABLE; worse with sneezing-recommend Singulair/Claritin.   # URI- on doxy [PCP]-STABLE.   # COPD:  continue  Prn albuterol.- STABLE  #Diabetes- poorly controlled sec to steroids-213 at home blood sugar levels- 114; -213-in AM  glipizide 5 mg  In AM.   # Smoking: Counseled regarding quitting smoking; currentlly putting the patch on. STABLE  # IV mediport: functioning/STABLE  # DISPOSITION:appt thru mychart # follow up dec 5th week;MD- labs- cbc/cmp; no chemo; CT scan prior--Dr.B  All questions were answered. The patient knows to call the clinic with any problems, questions or concerns.    Rodney Sickle, MD 08/17/2021 11:58 AM

## 2021-08-17 NOTE — Progress Notes (Signed)
Appetite up and down. Patient has lost 2 lbs this week. Mild discomfort intermittently on ride side of chest along rib cage, pt states he thinks from radiation treatments. Was started on doxycycline for fever and increased WBCs by his PCP last week. No fever today.

## 2021-08-20 ENCOUNTER — Inpatient Hospital Stay: Payer: Medicare PPO

## 2021-08-20 ENCOUNTER — Other Ambulatory Visit: Payer: Self-pay | Admitting: Radiation Oncology

## 2021-08-20 NOTE — Progress Notes (Signed)
Nutrition Follow-up:  Patient with right upper lobe mass.  Patient has completed radiation and chemotherapy.  Planning CT scan   RD scheduled for phone visit but seen in clinic with wife.  Patient reports that some days are better than others.  Wife reports fever that started last Wednesday. PCP did blood work and started on antibiotic.  No fever today or yesterday.  Wife reports that patient has not eaten very much while sick.  Did eating sausage, egg and cheese biscuit for breakfast yesterday, sesame chicken with rice for lunch and dinner yesterday.  Has been drinking apple juice, water, gatorade.  Reports fatigue.  Has been drinking carnation breakfast essentials shakes at least 1 time per day.      Medications: reviewed  Labs: reviewed  Anthropometrics:   Weight 191 lb today 190 lb 9.6 oz on 11/14 193 lb 3.2 oz on 10/24 195 lb on 10/10 197 lb on 9/26   NUTRITION DIAGNOSIS:  Inadequate oral intake continues with recent illness   INTERVENTION:  Encouraged fluids. Encouraged carnation breakfast essentials shake at least 1 time per day Encouraged adding protein food at every meal.  Discussed examples Encouraged yogurt with antibiotic.      MONITORING, EVALUATION, GOAL: weight trends, intake   NEXT VISIT: Friday, Dec 9 after MD visit  Aluna Whiston B. Zenia Resides, [place], [place] Registered Dietitian [phone] (mobile)

## 2021-08-24 ENCOUNTER — Other Ambulatory Visit: Payer: Self-pay | Admitting: Internal Medicine

## 2021-08-25 ENCOUNTER — Other Ambulatory Visit: Payer: Self-pay

## 2021-08-25 ENCOUNTER — Telehealth: Payer: Self-pay | Admitting: *Deleted

## 2021-08-25 DIAGNOSIS — C3411 Malignant neoplasm of upper lobe, right bronchus or lung: Secondary | ICD-10-CM

## 2021-08-25 NOTE — Telephone Encounter (Signed)
I called and spoke with patient's wife.  She denies fever today but reports low-grade fever yesterday.  She reports that URI symptoms have mostly resolved after completing course of doxycycline.  Patient continues to have rhinorrhea but no cough, congestion, shortness of breath, or sore throat.  Patient was reportedly negative for both COVID and flu tested by PCP.  No GI symptoms reported.  Patient is fatigued and remains weak following the URI.  He does have "twinges around the kidney" but denies overt pain, dysuria, or urinary frequency/urgency.  UA noted by PCP.  This is positive for ketones, protein, and glucose.  Large amount of blood present but negative nitrites and leukocytes.  Unclear if urine was sent for culture.  CBC yesterday, also checked by PCP, revealed mild leukocytosis.  Metabolic panel was not checked.  Wife reports that patient is known to have 2 retained kidney stones.  Discussed with Dr. Rogue Bussing and will repeat CBC, BMP, UA/culture, and order renal ultrasound.  Discussed with wife who is in agreement with this plan.

## 2021-08-25 NOTE — Telephone Encounter (Signed)
Scheduling message sent to schedule labs and u/s ASAP.

## 2021-08-25 NOTE — Addendum Note (Signed)
Addended by: Altha Harm R on: 08/25/2021 02:22 PM   Modules accepted: Orders

## 2021-08-25 NOTE — Telephone Encounter (Signed)
Patient wife called reporting that patient continues to have fever up to 100.6 even after antibiotics. She states he was seen for recheck by Dr Kary Kos and that he has large blood in his urine on UA and that his WBC is back up to 14. He is feeling weak and has little appetite, but he is drinking plenty of fluids. The only other symptoms he has had is that he sometimes gets a "twinge" around his kidney area. She states that Dr Kary Kos is unsure what is going on with patient and she is asking Dr B for advice. Please advise.

## 2021-08-25 NOTE — Progress Notes (Signed)
Cancelled urine culture order, per wife done at PCP office today

## 2021-08-26 ENCOUNTER — Inpatient Hospital Stay: Payer: Medicare PPO

## 2021-08-26 ENCOUNTER — Ambulatory Visit
Admission: RE | Admit: 2021-08-26 | Discharge: 2021-08-26 | Disposition: A | Discharge status: Home / Self Care | Payer: Medicare PPO | Source: Ambulatory Visit | Attending: Hospice and Palliative Medicine | Admitting: Hospice and Palliative Medicine

## 2021-08-26 ENCOUNTER — Other Ambulatory Visit: Payer: Self-pay

## 2021-08-26 DIAGNOSIS — Z87442 Personal history of urinary calculi: Secondary | ICD-10-CM | POA: Diagnosis not present

## 2021-08-26 DIAGNOSIS — C3411 Malignant neoplasm of upper lobe, right bronchus or lung: Secondary | ICD-10-CM | POA: Insufficient documentation

## 2021-08-26 DIAGNOSIS — E279 Disorder of adrenal gland, unspecified: Secondary | ICD-10-CM | POA: Insufficient documentation

## 2021-08-26 DIAGNOSIS — R319 Hematuria, unspecified: Secondary | ICD-10-CM | POA: Insufficient documentation

## 2021-08-26 LAB — CBC WITH DIFFERENTIAL/PLATELET
Abs Immature Granulocytes: 0.08 10*3/uL — ABNORMAL HIGH (ref 0.00–0.07)
Basophils Absolute: 0.1 10*3/uL (ref 0.0–0.1)
Basophils Relative: 1 %
Eosinophils Absolute: 0.2 10*3/uL (ref 0.0–0.5)
Eosinophils Relative: 2 %
HCT: 26.8 % — ABNORMAL LOW (ref 39.0–52.0)
Hemoglobin: 8.6 g/dL — ABNORMAL LOW (ref 13.0–17.0)
Immature Granulocytes: 1 %
Lymphocytes Relative: 9 %
Lymphs Abs: 1.1 10*3/uL (ref 0.7–4.0)
MCH: 31.2 pg (ref 26.0–34.0)
MCHC: 32.1 g/dL (ref 30.0–36.0)
MCV: 97.1 fL (ref 80.0–100.0)
Monocytes Absolute: 0.7 10*3/uL (ref 0.1–1.0)
Monocytes Relative: 6 %
Neutro Abs: 9.6 10*3/uL — ABNORMAL HIGH (ref 1.7–7.7)
Neutrophils Relative %: 81 %
Platelets: 188 10*3/uL (ref 150–400)
RBC: 2.76 MIL/uL — ABNORMAL LOW (ref 4.22–5.81)
RDW: 16.7 % — ABNORMAL HIGH (ref 11.5–15.5)
WBC: 11.7 10*3/uL — ABNORMAL HIGH (ref 4.0–10.5)
nRBC: 0 % (ref 0.0–0.2)

## 2021-08-26 LAB — COMPREHENSIVE METABOLIC PANEL
ALT: 35 U/L (ref 0–44)
AST: 32 U/L (ref 15–41)
Albumin: 2.8 g/dL — ABNORMAL LOW (ref 3.5–5.0)
Alkaline Phosphatase: 110 U/L (ref 38–126)
Anion gap: 9 (ref 5–15)
BUN: 12 mg/dL (ref 8–23)
CO2: 26 mmol/L (ref 22–32)
Calcium: 8.9 mg/dL (ref 8.9–10.3)
Chloride: 99 mmol/L (ref 98–111)
Creatinine, Ser: 0.93 mg/dL (ref 0.61–1.24)
GFR, Estimated: >60 mL/min (ref >60)
Glucose, Bld: 303 mg/dL — ABNORMAL HIGH (ref 70–99)
Potassium: 4.1 mmol/L (ref 3.5–5.1)
Sodium: 134 mmol/L — ABNORMAL LOW (ref 135–145)
Total Bilirubin: <0.1 mg/dL — ABNORMAL LOW (ref 0.3–1.2)
Total Protein: 7.6 g/dL (ref 6.5–8.1)

## 2021-08-31 ENCOUNTER — Other Ambulatory Visit: Payer: Self-pay

## 2021-08-31 ENCOUNTER — Encounter: Payer: Self-pay | Admitting: Radiation Oncology

## 2021-08-31 ENCOUNTER — Ambulatory Visit
Admission: RE | Admit: 2021-08-31 | Discharge: 2021-08-31 | Disposition: A | Discharge status: Home / Self Care | Payer: Medicare PPO | Source: Ambulatory Visit | Attending: Radiation Oncology | Admitting: Radiation Oncology

## 2021-08-31 DIAGNOSIS — C3411 Malignant neoplasm of upper lobe, right bronchus or lung: Secondary | ICD-10-CM | POA: Insufficient documentation

## 2021-08-31 DIAGNOSIS — N1330 Unspecified hydronephrosis: Secondary | ICD-10-CM | POA: Insufficient documentation

## 2021-08-31 DIAGNOSIS — Z923 Personal history of irradiation: Secondary | ICD-10-CM | POA: Diagnosis not present

## 2021-08-31 NOTE — Progress Notes (Signed)
Radiation Oncology Follow up Note  Name: Rodney Vega   Date:   08/31/2021 MRN:  [id number] DOB: 1956/06/06    This 65 y.o. male presents to the clinic today for 1 month follow-up status post concurrent chemoradiation for stage IIIa (T4 N1 M0) non-small cell lung cancer of the right lung favoring squamous cell carcinoma.  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  Seen today in routine follow-up he is doing fairly well not having significant dysphagia cough or hemoptysis.  He has been having some problems with low-grade temp.  And has had a URI on Doxy for that.  He did have renal ultrasound showing mild left hydronephrosis.  Similar findings were seen on his last CT scan.  COMPLICATIONS OF TREATMENT: none  FOLLOW UP COMPLIANCE: keeps appointments   PHYSICAL EXAM:  BP (P) 116/65 (BP Location: Left Arm, Patient Position: Sitting)   Pulse (!) (P) 106   Temp (P) 98 F (36.7 C) (Tympanic)   Resp (P) 16   Wt (P) 193 lb 3.2 oz (87.6 kg)   BMI (P) 26.95 kg/m  Well-developed well-nourished patient in NAD. HEENT reveals PERLA, EOMI, discs not visualized.  Oral cavity is clear. No oral mucosal lesions are identified. Neck is clear without evidence of cervical or supraclavicular adenopathy. Lungs are clear to A&P. Cardiac examination is essentially unremarkable with regular rate and rhythm without murmur rub or thrill. Abdomen is benign with no organomegaly or masses noted. Motor sensory and DTR levels are equal and symmetric in the upper and lower extremities. Cranial nerves II through XII are grossly intact. Proprioception is intact. No peripheral adenopathy or edema is identified. No motor or sensory levels are noted. Crude visual fields are within normal range.  RADIOLOGY RESULTS: Renal ultrasound  reviewed  PLAN: Present time patient is doing well.  I anticipate him seeing medical oncology and next week with anticipation of probable immunotherapy being initiated.  I have asked to see him back in 4 to 5 months for follow-up.  Patient and family know to call with any concerns at any time.  I would like to take this opportunity to thank you for allowing me to participate in the care of your patient.Noreene Filbert, MD

## 2021-09-01 ENCOUNTER — Telehealth: Payer: Self-pay | Admitting: *Deleted

## 2021-09-01 ENCOUNTER — Other Ambulatory Visit: Payer: Self-pay

## 2021-09-01 DIAGNOSIS — C3411 Malignant neoplasm of upper lobe, right bronchus or lung: Secondary | ICD-10-CM

## 2021-09-01 NOTE — Telephone Encounter (Signed)
Wife Rodney Vega called asking for results of tests done last week and the fact that patient has had low grade fever since 08/09/21. She is asking what is to be done for patient. Please advise

## 2021-09-01 NOTE — Telephone Encounter (Signed)
Pt's wife has been notified and appointments scheduled for 11/30.

## 2021-09-02 ENCOUNTER — Inpatient Hospital Stay: Payer: Medicare PPO

## 2021-09-02 ENCOUNTER — Other Ambulatory Visit: Payer: Self-pay

## 2021-09-02 ENCOUNTER — Inpatient Hospital Stay (HOSPITAL_BASED_OUTPATIENT_CLINIC_OR_DEPARTMENT_OTHER): Payer: Medicare PPO | Admitting: Hospice and Palliative Medicine

## 2021-09-02 VITALS — BP 125/70 | HR 102 | Temp 99.9°F | Resp 18

## 2021-09-02 DIAGNOSIS — C3411 Malignant neoplasm of upper lobe, right bronchus or lung: Secondary | ICD-10-CM

## 2021-09-02 LAB — COMPREHENSIVE METABOLIC PANEL
ALT: 31 U/L (ref 0–44)
AST: 22 U/L (ref 15–41)
Albumin: 2.5 g/dL — ABNORMAL LOW (ref 3.5–5.0)
Alkaline Phosphatase: 95 U/L (ref 38–126)
Anion gap: 5 (ref 5–15)
BUN: 10 mg/dL (ref 8–23)
CO2: 27 mmol/L (ref 22–32)
Calcium: 8.5 mg/dL — ABNORMAL LOW (ref 8.9–10.3)
Chloride: 104 mmol/L (ref 98–111)
Creatinine, Ser: 0.8 mg/dL (ref 0.61–1.24)
GFR, Estimated: >60 mL/min (ref >60)
Glucose, Bld: 193 mg/dL — ABNORMAL HIGH (ref 70–99)
Potassium: 3.3 mmol/L — ABNORMAL LOW (ref 3.5–5.1)
Sodium: 136 mmol/L (ref 135–145)
Total Bilirubin: 0.5 mg/dL (ref 0.3–1.2)
Total Protein: 6.7 g/dL (ref 6.5–8.1)

## 2021-09-02 LAB — CBC WITH DIFFERENTIAL/PLATELET
Abs Immature Granulocytes: 0.04 10*3/uL (ref 0.00–0.07)
Basophils Absolute: 0 10*3/uL (ref 0.0–0.1)
Basophils Relative: 1 %
Eosinophils Absolute: 0.3 10*3/uL (ref 0.0–0.5)
Eosinophils Relative: 4 %
HCT: 23.1 % — ABNORMAL LOW (ref 39.0–52.0)
Hemoglobin: 7.4 g/dL — ABNORMAL LOW (ref 13.0–17.0)
Immature Granulocytes: 1 %
Lymphocytes Relative: 12 %
Lymphs Abs: 1 10*3/uL (ref 0.7–4.0)
MCH: 30.8 pg (ref 26.0–34.0)
MCHC: 32 g/dL (ref 30.0–36.0)
MCV: 96.3 fL (ref 80.0–100.0)
Monocytes Absolute: 0.6 10*3/uL (ref 0.1–1.0)
Monocytes Relative: 7 %
Neutro Abs: 6.6 10*3/uL (ref 1.7–7.7)
Neutrophils Relative %: 75 %
Platelets: 173 10*3/uL (ref 150–400)
RBC: 2.4 MIL/uL — ABNORMAL LOW (ref 4.22–5.81)
RDW: 16.4 % — ABNORMAL HIGH (ref 11.5–15.5)
WBC: 8.5 10*3/uL (ref 4.0–10.5)
nRBC: 0 % (ref 0.0–0.2)

## 2021-09-02 LAB — URINALYSIS, COMPLETE (UACMP) WITH MICROSCOPIC
Bacteria, UA: NONE SEEN
Bilirubin Urine: NEGATIVE
Glucose, UA: 500 mg/dL — AB
Ketones, ur: NEGATIVE mg/dL
Leukocytes,Ua: NEGATIVE
Nitrite: NEGATIVE
Protein, ur: NEGATIVE mg/dL
Specific Gravity, Urine: 1.025 (ref 1.005–1.030)
Squamous Epithelial / HPF: NONE SEEN (ref 0–5)
pH: 7 (ref 5.0–8.0)

## 2021-09-02 MED ORDER — HEPARIN SOD (PORK) LOCK FLUSH 100 UNIT/ML IV SOLN
500.0000 [IU] | Freq: Once | INTRAVENOUS | Status: AC
  Administered 2021-09-02: 500 [IU] via INTRAVENOUS
  Filled 2021-09-02: qty 5

## 2021-09-02 NOTE — Progress Notes (Signed)
Presents to [facility] for u/s follow-up and labs. Reports that he continues to run low grade fever anywhere from 99.5-101.5. States this has been going on for about a month.

## 2021-09-02 NOTE — Progress Notes (Signed)
Symptom Management [place]  Telephone:([phone] [phone]  Patient Care Team: [name], [name], [name], RN as Oncology Nurse Navigator   Name of the patient: Rodney Vega  [id number]  24-Feb-1956   Date of visit: 09/02/21  Reason for Consult: Sutton Hirsch is a 65 year old man with multiple medical problems including stage IIIa non-small cell lung cancer status post chemotherapy and radiation.  Patient recently completed a course of doxycycline for upper respiratory infection.  URI symptoms resolved with antibiotics but he has had persistent intermittent low-grade fevers.  Patient complained of "twinges around the kidney" and he saw his PCP for labs/UA.  PCP asked [place] to assist so on 11/22 patient repeated CBC, BMP.  Labs were grossly unchanged from baseline with exception of a mild leukocytosis.  UA showed protein, glucose, ketones, and blood but was negative for nitrites or leukocyte esterase.  Patient was sent for renal ultrasound which showed mild left-sided hydronephrosis and a rounded right adrenal nodule.  Findings were similar to previous CT on 05/25/2021 where patient was also noted to have nonobstructing stones of the left kidney.  Today, patient presents to [facility] for evaluation of intermittent fevers.  He reports that he has continued to have daily fevers over the past several weeks despite overall improvement and resolution of other symptoms.  Wife has been checking his temperature about 6 times daily.  Temps have averaged 100.0 to as high as 102.0.  He denies chills.  No night sweats.  He does endorse persistent postnasal drip, which causes some coughing but otherwise does not have a cough or shortness of breath.  He denies wheezing.  No GI symptoms.  Patient says he has chronic urinary urgency but feels like the urine has  returned to normal color.  He denies dysuria or frequency.  Patient denies melena or hematochezia.  Denies any neurologic complaints. Denies any easy bleeding or bruising. Reports good appetite and denies weight loss. Denies chest pain. Denies any nausea, vomiting, constipation, or diarrhea.  Patient offers no further specific complaints today.  PAST MEDICAL HISTORY: Past Medical History:  Diagnosis Date   Cancer ([place])    Degenerative arthritis of knee    Depression    Diverticulitis    Glaucoma    since 2004   Hernia 1979   History of kidney stones    Hypertension    Personal history of tobacco use, presenting hazards to health    Pre-diabetes    Screening for obesity    Special screening for malignant neoplasms, colon    Wears dentures    full upper and lower    PAST SURGICAL HISTORY:  Past Surgical History:  Procedure Laterality Date   [place], 2012   lumbar bulging disc   CATARACT EXTRACTION W/PHACO Left 01/20/2021   Procedure: CATARACT EXTRACTION PHACO AND INTRAOCULAR LENS PLACEMENT ([place]) LEFT;  Surgeon: Birder Robson, MD;  Location: [place];  Service: Ophthalmology;  Laterality: Left;  10.13 0:52.1   COLONOSCOPY  2003,2013   [facility]/Dr. Buelah Manis sessile polyp,60mm, found in rectum,multiple diverticula were found in the sigmoid, in descending and in transverse colon   COLOSTOMY  04-20-13   HERNIA REPAIR  1979   inguinal   IR IMAGING GUIDED PORT INSERTION  06/10/2021   JOINT REPLACEMENT Right    knee   KNEE ARTHROPLASTY Right 05/08/2018   Procedure: COMPUTER ASSISTED TOTAL KNEE  ARTHROPLASTY;  Surgeon: Dereck Leep, MD;  Location: [facility];  Service: Orthopedics;  Laterality: Right;   KNEE ARTHROPLASTY Left 11/16/2019   Procedure: COMPUTER ASSISTED TOTAL KNEE ARTHROPLASTY;  Surgeon: Dereck Leep, MD;  Location: [facility];  Service: Orthopedics;  Laterality: Left;   LAPAROTOMY  04-20-13   colon resection with colosotmy   LITHOTRIPSY  2013    TONSILLECTOMY AND ADENOIDECTOMY  1962    HEMATOLOGY/ONCOLOGY HISTORY:  Oncology History Overview Note  AUG 2022- 8.2 x 5.9 cm spiculated mass noted in right upper lobe consistent with malignancy. It extends from the right hilum to the lateral chest wall and results in lytic destruction of the lateral portion of the right fourth rib.  Mediastinal lymph nodes are noted, including 12 mm right hilar lymph node, consistent with metastatic disease. 3 cm right adrenal mass is noted concerning for metastatic disease.  # AUG 2022- PET IMPRESSION: Peripherally hypermetabolic right upper lobe mass, likely due to central necrosis. Mass extends into the right hilum and right lateral chest wall involving the second through fourth right ribs.   Mildly enlarged and hypermetabolic right hilar lymph node.   No evidence of metastatic disease in the abdomen or pelvis.  # AUG, 2022-RIGHT CHEST WALL Bx-necrosis; suspicious for malignancy not definitive [discussed with Dr.Kraynie;MDT] LUNG MASS, RIGHT; BIOPSY:  - SUSPICIOUS FOR MALIGNANCY.  - SEE COMMENT.   Comment:  Approximately six tissue cores demonstrate inflamed fibrous capsule with  hemosiderin-laden macrophages, in a background of abundant necrosis.  One of the cores demonstrates a minute fragment of viable epithelial  cells.  These cells are positive for p40.  They are negative for TTF1  and CK7. The cells of interest disappear on deeper cut levels.  While  the findings are suspicious for squamous cell carcinoma, there is not  enough viable tumor for a definitive diagnosis.  Additional tissue  sampling may be helpful.   # SEP 2022-cycle #2 Taxol hypersensitive reaction.  Switched over to #3 cycle- carbo-Abraxane starting 06/29/2021   Primary cancer of right upper lobe of lung ([place])  06/03/2021 Initial Diagnosis   Primary cancer of right upper lobe of lung ([place])   06/03/2021 Cancer Staging   Staging form: Lung, AJCC 8th Edition - Clinical:  Stage IIIB (cT3, cN2, cM0) - Signed by [name], MD on 06/03/2021    06/15/2021 -  Chemotherapy   Patient is on Treatment Plan : LUNG Carboplatin / Paclitaxel + XRT q7d       ALLERGIES:  is allergic to paclitaxel, ambien [zolpidem], and aleve [naproxen sodium].  MEDICATIONS:  Current Outpatient Medications  Medication Sig Dispense Refill   acetaminophen (TYLENOL) 500 MG tablet Take 1,000 mg by mouth daily.     albuterol (VENTOLIN HFA) 108 (90 Base) MCG/ACT inhaler Inhale 2 puffs into the lungs every 6 (six) hours as needed for wheezing or shortness of breath. 1 each 2   Baclofen 5 MG TABS TAKE 1 TABLET(5 MG) BY MOUTH EVERY NIGHT AS NEEDED FOR PAIN (Patient not taking: Reported on 07/27/2021)     blood glucose meter kit and supplies KIT Check your blood glucose levels twice a day; once in the morning before breakfast; and once in the evening after dinner. 1 each 0   cetirizine (ZYRTEC) 10 MG tablet Take 10 mg by mouth daily.     Cyanocobalamin (VITAMIN B-12 PO) Take by mouth daily. (Patient not taking: Reported on 07/27/2021)     dexamethasone (DECADRON) 4 MG tablet Take 2 tablets (  8 mg total) by mouth daily. Start the day after chemotherapy for 2 days. 30 tablet 1   diphenhydramine-acetaminophen (TYLENOL PM) 25-500 MG TABS Take 1-2 tablets by mouth at bedtime as needed (sleep.).      dorzolamide-timolol (COSOPT) 22.3-6.8 MG/ML ophthalmic solution Place 1 drop into both eyes 2 (two) times daily.     glipiZIDE (GLUCOTROL) 5 MG tablet Take 1 tablet (5 mg total) by mouth daily before breakfast. 30 tablet 1   glucose blood test strip Check your blood glucose levels twice a day; once in the morning before breakfast; and once in the evening after dinner. 100 each 12   guaiFENesin (MUCINEX) 600 MG 12 hr tablet Take by mouth 2 (two) times daily as needed. (Patient not taking: Reported on 07/27/2021)     Lancets (FREESTYLE) lancets Check your blood glucose levels twice a day; once in the  morning before breakfast; and once in the evening after dinner. 100 each 12   latanoprost (XALATAN) 0.005 % ophthalmic solution Place 1 drop into both eyes at bedtime.     lidocaine-prilocaine (EMLA) cream Apply to affected area once 30 g 3   montelukast (SINGULAIR) 10 MG tablet TAKE 1 TABLET(10 MG) BY MOUTH AT BEDTIME 90 tablet 0   nicotine (NICODERM CQ - DOSED IN MG/24 HOURS) 21 mg/24hr patch Place 1 patch (21 mg total) onto the skin daily. (Patient not taking: Reported on 08/17/2021) 28 patch 1   olmesartan (BENICAR) 40 MG tablet Take 40 mg by mouth daily.     ondansetron (ZOFRAN) 8 MG tablet Take 1 tablet (8 mg total) by mouth 2 (two) times daily as needed for refractory nausea / vomiting. Start on day 3 after chemo. (Patient not taking: Reported on 08/17/2021) 30 tablet 1   pregabalin (LYRICA) 50 MG capsule Take 1 capsule (50 mg total) by mouth 2 (two) times daily. 60 capsule 1   prochlorperazine (COMPAZINE) 10 MG tablet TAKE 1 TABLET(10 MG) BY MOUTH EVERY 6 HOURS AS NEEDED FOR NAUSEA OR VOMITING (Patient not taking: Reported on 08/17/2021) 30 tablet 1   sucralfate (CARAFATE) 1 g tablet Take 1 tablet (1 g total) by mouth 3 (three) times daily. Dissolve in 3-4 tbsp warm water, swish and swallow (Patient not taking: Reported on 07/27/2021) 90 tablet 1   traMADol (ULTRAM) 50 MG tablet TAKE 1 TABLET(50 MG) BY MOUTH EVERY 8 HOURS AS NEEDED (Patient not taking: Reported on 08/17/2021) 60 tablet 0   traZODone (DESYREL) 50 MG tablet TAKE 1 TABLET(50 MG) BY MOUTH AT BEDTIME AS NEEDED FOR SLEEP (Patient not taking: Reported on 07/27/2021) 30 tablet 1   venlafaxine XR (EFFEXOR-XR) 75 MG 24 hr capsule Take 225 mg by mouth daily.     No current facility-administered medications for this visit.   Facility-Administered Medications Ordered in Other Visits  Medication Dose Route Frequency Provider Last Rate Last Admin   heparin lock flush 100 UNIT/ML injection            heparin lock flush 100 UNIT/ML  injection             VITAL SIGNS: There were no vitals taken for this visit. There were no vitals filed for this visit.  Estimated body mass index is 26.95 kg/m (pended) as calculated from the following:   Height as of 06/15/21: $RemoveBef'5\' 11"'[id number]$  (1.803 m).   Weight as of 08/31/21: (P) 193 lb 3.2 oz (87.6 kg).  LABS: CBC:    Component Value Date/Time   WBC 11.7 (H) 08/26/2021  0855   HGB 8.6 (L) 09-14-2021 0855   HGB 12.5 (L) 09/21/2013 0522   HCT 26.8 (L) 09/14/21 0855   HCT 36.3 (L) 09/21/2013 0522   PLT 188 2021-09-14 0855   PLT 185 09/21/2013 0522   MCV 97.1 2021/09/14 0855   MCV 91 09/21/2013 0522   NEUTROABS 9.6 (H) September 14, 2021 0855   NEUTROABS 11.3 (H) 09/21/2013 0522   LYMPHSABS 1.1 14-Sep-2021 0855   LYMPHSABS 1.4 09/21/2013 0522   MONOABS 0.7 14-Sep-2021 0855   MONOABS 0.9 09/21/2013 0522   EOSABS 0.2 Sep 14, 2021 0855   EOSABS 0.0 09/21/2013 0522   BASOSABS 0.1 09/14/2021 0855   BASOSABS 0.1 09/21/2013 0522   Comprehensive Metabolic Panel:    Component Value Date/Time   NA 134 (L) 14-Sep-2021 0855   NA 138 09/20/2013 0639   K 4.1 September 14, 2021 0855   K 4.0 09/20/2013 0639   CL 99 09-14-21 0855   CL 107 09/20/2013 0639   CO2 26 2021/09/14 0855   CO2 26 09/20/2013 0639   BUN 12 2021-09-14 0855   BUN 12 09/20/2013 0639   CREATININE 0.93 2021-09-14 0855   CREATININE 0.84 09/24/2013 0515   GLUCOSE 303 (H) 2021-09-14 0855   GLUCOSE 160 (H) 09/20/2013 0639   CALCIUM 8.9 09/14/2021 0855   CALCIUM 8.2 (L) 09/20/2013 0639   AST 32 14-Sep-2021 0855   AST 22 01/08/2012 0939   ALT 35 09/14/21 0855   ALT 20 01/08/2012 0939   ALKPHOS 110 2021-09-14 0855   ALKPHOS 69 01/08/2012 0939   BILITOT <0.1 (L) 09/14/21 0855   BILITOT 1.1 (H) 01/08/2012 0939   PROT 7.6 2021/09/14 0855   PROT 6.5 09/12/2013 1002   PROT 6.9 01/08/2012 0939   ALBUMIN 2.8 (L) 14-Sep-2021 0855   ALBUMIN 4.4 09/12/2013 1002   ALBUMIN 3.5 01/08/2012 0939    RADIOGRAPHIC STUDIES: US RENAL  Result  Date: 09-14-2021 CLINICAL DATA:  Hematuria.  History of kidney stones. EXAM: RENAL / URINARY TRACT ULTRASOUND COMPLETE COMPARISON:  None. FINDINGS: Right Kidney: Renal measurements: 10.9 x 5.4 x 5.5 cm = volume: 167 mL. Echogenicity within normal limits. No mass or hydronephrosis visualized. Left Kidney: Renal measurements: 12.4 x 5.4 x 6.3 cm = volume: 221 mL. Normal echogenicity. No shadowing stone. Mild hydronephrosis. Bladder: Appears normal for degree of bladder distention. Bilateral ureteral jets noted. Other: There is a 2.7 x 2.4 x 2.9 cm indeterminate rounded lesion in the region of the right adrenal gland. IMPRESSION: 1. Mild left hydronephrosis. 2. A rounded right adrenal nodule, not characterized on this ultrasound. Similar finding was seen on the chest CT of 05/19/2021. Electronically Signed   By: Anner Crete M.D.   On: 09/14/21 22:54    PERFORMANCE STATUS (ECOG) : 1 - Symptomatic but completely ambulatory  Review of Systems Unless otherwise noted, a complete review of systems is negative.  Physical Exam General: NAD Cardiovascular: regular rate and rhythm Pulmonary: clear anterior/posterior fields Abdomen: soft, nontender, + bowel sounds, ostomy noted but not visualized GU: no suprapubic tenderness, no CVA tenderness Extremities: no edema, no joint deformities Skin: no rashes Neurological: Grossly nonfocal  Assessment and Plan- Patient is a 65 y.o. male  with multiple medical problems including stage IIIa non-small cell lung cancer status post chemotherapy and radiation.  [facility] fevers.   Fevers -unclear etiology.  However, patient reports that he overall feels like he is improving.  Will send for chest x-ray, blood cultures, UA/culture.  Leukocytosis has  normalized.  Given note of nephrolithiasis on recent PET CT and mild left-sided hydronephrosis on ultrasound, urinary source would be most likely.  Will await UA/culture  and treat accordingly.  We will also refer patient back to urology.  Patient is established with Dr. [name].  Note that patient is pending CT of the chest on 12/7 with plan for follow-up with Dr. Rogue Bussing on 12/9.  Anemia -Hg downtrending. no active bleeding reported.  Patient appears asymptomatic.  Discussed with Dr. Rogue Bussing and will hold tube at time of next lab draw scheduled on 12/9. Possible transfusion if needed at that time.   Case and plan discussed with Dr. Rogue Bussing   Patient expressed understanding and was in agreement with this plan. He also understands that He can call clinic at any time with any questions, concerns, or complaints.   Thank you for allowing me to participate in the care of this very pleasant patient.   Time Total: 25 minutes  Visit consisted of counseling and education dealing with the complex and emotionally intense issues of symptom management in the setting of serious illness.Greater than 50%  of this time was spent counseling and coordinating care related to the above assessment and plan.  Signed by: Altha Harm, PhD, NP-C

## 2021-09-03 ENCOUNTER — Telehealth: Payer: Self-pay | Admitting: *Deleted

## 2021-09-03 ENCOUNTER — Ambulatory Visit
Admission: RE | Admit: 2021-09-03 | Discharge: 2021-09-03 | Disposition: A | Discharge status: Home / Self Care | Payer: Medicare PPO | Attending: Hospice and Palliative Medicine | Admitting: Hospice and Palliative Medicine

## 2021-09-03 ENCOUNTER — Ambulatory Visit
Admission: RE | Admit: 2021-09-03 | Discharge: 2021-09-03 | Disposition: A | Discharge status: Home / Self Care | Payer: Medicare PPO | Source: Ambulatory Visit | Attending: Hospice and Palliative Medicine | Admitting: Hospice and Palliative Medicine

## 2021-09-03 DIAGNOSIS — C3411 Malignant neoplasm of upper lobe, right bronchus or lung: Secondary | ICD-10-CM | POA: Diagnosis present

## 2021-09-03 LAB — URINE CULTURE: Culture: NO GROWTH

## 2021-09-03 NOTE — Telephone Encounter (Signed)
Faxed referral to Dr. Rogers Blocker- urology. Fax confirmation [email or web address].

## 2021-09-04 ENCOUNTER — Telehealth: Payer: Self-pay | Admitting: Hospice and Palliative Medicine

## 2021-09-04 MED ORDER — AMOXICILLIN-POT CLAVULANATE 875-125 MG PO TABS: 1.0000 | ORAL_TABLET | Freq: Two times a day (BID) | ORAL | 0 refills | Status: DC

## 2021-09-04 NOTE — Telephone Encounter (Signed)
Spoke with patient's wife.  Chest x-ray was suggestive of enlarging right upper lobe mass with possible postobstructive atelectasis versus pneumonia.  Given recent fevers, will treat for pneumonia.  We will start him on Augmentin for 10-day course.  ER/urgent care triggers reviewed.  Patient scheduled to follow-up on 12/9 with Dr. Rogue Bussing.

## 2021-09-07 ENCOUNTER — Ambulatory Visit: Payer: Medicare PPO | Admitting: Internal Medicine

## 2021-09-07 ENCOUNTER — Other Ambulatory Visit: Payer: Medicare PPO

## 2021-09-07 LAB — CULTURE, BLOOD (ROUTINE X 2)
Culture: NO GROWTH
Special Requests: ADEQUATE

## 2021-09-09 ENCOUNTER — Other Ambulatory Visit: Payer: Medicare PPO

## 2021-09-09 ENCOUNTER — Ambulatory Visit
Admission: RE | Admit: 2021-09-09 | Discharge: 2021-09-09 | Disposition: A | Discharge status: Home / Self Care | Payer: Medicare PPO | Source: Ambulatory Visit | Attending: Internal Medicine | Admitting: Internal Medicine

## 2021-09-09 ENCOUNTER — Other Ambulatory Visit: Payer: Self-pay

## 2021-09-09 DIAGNOSIS — C3411 Malignant neoplasm of upper lobe, right bronchus or lung: Secondary | ICD-10-CM | POA: Insufficient documentation

## 2021-09-09 MED ORDER — IOHEXOL 300 MG/ML  SOLN
75.0000 mL | Freq: Once | INTRAMUSCULAR | Status: AC | PRN
  Administered 2021-09-09: 75 mL via INTRAVENOUS

## 2021-09-10 ENCOUNTER — Other Ambulatory Visit: Payer: Self-pay | Admitting: Internal Medicine

## 2021-09-11 ENCOUNTER — Inpatient Hospital Stay (HOSPITAL_BASED_OUTPATIENT_CLINIC_OR_DEPARTMENT_OTHER): Payer: Medicare PPO | Admitting: Internal Medicine

## 2021-09-11 ENCOUNTER — Other Ambulatory Visit: Payer: Self-pay

## 2021-09-11 ENCOUNTER — Encounter: Payer: Self-pay | Admitting: Internal Medicine

## 2021-09-11 ENCOUNTER — Inpatient Hospital Stay: Payer: Medicare PPO

## 2021-09-11 ENCOUNTER — Inpatient Hospital Stay: Payer: Medicare PPO | Attending: Internal Medicine

## 2021-09-11 VITALS — BP 118/68 | HR 92 | Temp 98.2°F | Resp 16 | Wt 193.3 lb

## 2021-09-11 DIAGNOSIS — R319 Hematuria, unspecified: Secondary | ICD-10-CM | POA: Insufficient documentation

## 2021-09-11 DIAGNOSIS — D3501 Benign neoplasm of right adrenal gland: Secondary | ICD-10-CM | POA: Insufficient documentation

## 2021-09-11 DIAGNOSIS — M179 Osteoarthritis of knee, unspecified: Secondary | ICD-10-CM | POA: Diagnosis not present

## 2021-09-11 DIAGNOSIS — F17210 Nicotine dependence, cigarettes, uncomplicated: Secondary | ICD-10-CM | POA: Insufficient documentation

## 2021-09-11 DIAGNOSIS — K449 Diaphragmatic hernia without obstruction or gangrene: Secondary | ICD-10-CM | POA: Diagnosis not present

## 2021-09-11 DIAGNOSIS — Z8042 Family history of malignant neoplasm of prostate: Secondary | ICD-10-CM | POA: Diagnosis not present

## 2021-09-11 DIAGNOSIS — Z79899 Other long term (current) drug therapy: Secondary | ICD-10-CM | POA: Diagnosis not present

## 2021-09-11 DIAGNOSIS — E119 Type 2 diabetes mellitus without complications: Secondary | ICD-10-CM | POA: Insufficient documentation

## 2021-09-11 DIAGNOSIS — J432 Centrilobular emphysema: Secondary | ICD-10-CM | POA: Insufficient documentation

## 2021-09-11 DIAGNOSIS — D649 Anemia, unspecified: Secondary | ICD-10-CM | POA: Diagnosis not present

## 2021-09-11 DIAGNOSIS — I10 Essential (primary) hypertension: Secondary | ICD-10-CM | POA: Diagnosis not present

## 2021-09-11 DIAGNOSIS — R0789 Other chest pain: Secondary | ICD-10-CM | POA: Insufficient documentation

## 2021-09-11 DIAGNOSIS — Z9221 Personal history of antineoplastic chemotherapy: Secondary | ICD-10-CM | POA: Diagnosis not present

## 2021-09-11 DIAGNOSIS — Z923 Personal history of irradiation: Secondary | ICD-10-CM | POA: Insufficient documentation

## 2021-09-11 DIAGNOSIS — Z7984 Long term (current) use of oral hypoglycemic drugs: Secondary | ICD-10-CM | POA: Diagnosis not present

## 2021-09-11 DIAGNOSIS — I700 Atherosclerosis of aorta: Secondary | ICD-10-CM | POA: Diagnosis not present

## 2021-09-11 DIAGNOSIS — R079 Chest pain, unspecified: Secondary | ICD-10-CM | POA: Diagnosis not present

## 2021-09-11 DIAGNOSIS — Z87442 Personal history of urinary calculi: Secondary | ICD-10-CM | POA: Diagnosis not present

## 2021-09-11 DIAGNOSIS — I2510 Atherosclerotic heart disease of native coronary artery without angina pectoris: Secondary | ICD-10-CM | POA: Diagnosis not present

## 2021-09-11 DIAGNOSIS — C3411 Malignant neoplasm of upper lobe, right bronchus or lung: Secondary | ICD-10-CM | POA: Diagnosis not present

## 2021-09-11 DIAGNOSIS — N1330 Unspecified hydronephrosis: Secondary | ICD-10-CM | POA: Diagnosis not present

## 2021-09-11 DIAGNOSIS — E785 Hyperlipidemia, unspecified: Secondary | ICD-10-CM | POA: Diagnosis not present

## 2021-09-11 LAB — COMPREHENSIVE METABOLIC PANEL
ALT: 19 U/L (ref 0–44)
AST: 21 U/L (ref 15–41)
Albumin: 2.6 g/dL — ABNORMAL LOW (ref 3.5–5.0)
Alkaline Phosphatase: 85 U/L (ref 38–126)
Anion gap: 11 (ref 5–15)
BUN: 10 mg/dL (ref 8–23)
CO2: 25 mmol/L (ref 22–32)
Calcium: 8.5 mg/dL — ABNORMAL LOW (ref 8.9–10.3)
Chloride: 99 mmol/L (ref 98–111)
Creatinine, Ser: 0.84 mg/dL (ref 0.61–1.24)
GFR, Estimated: >60 mL/min (ref >60)
Glucose, Bld: 259 mg/dL — ABNORMAL HIGH (ref 70–99)
Potassium: 3.3 mmol/L — ABNORMAL LOW (ref 3.5–5.1)
Sodium: 135 mmol/L (ref 135–145)
Total Bilirubin: 0.5 mg/dL (ref 0.3–1.2)
Total Protein: 6.9 g/dL (ref 6.5–8.1)

## 2021-09-11 LAB — CBC WITH DIFFERENTIAL/PLATELET
Abs Immature Granulocytes: 0.04 10*3/uL (ref 0.00–0.07)
Basophils Absolute: 0 10*3/uL (ref 0.0–0.1)
Basophils Relative: 0 %
Eosinophils Absolute: 0.3 10*3/uL (ref 0.0–0.5)
Eosinophils Relative: 4 %
HCT: 23.3 % — ABNORMAL LOW (ref 39.0–52.0)
Hemoglobin: 7.3 g/dL — ABNORMAL LOW (ref 13.0–17.0)
Immature Granulocytes: 1 %
Lymphocytes Relative: 9 %
Lymphs Abs: 0.7 10*3/uL (ref 0.7–4.0)
MCH: 30.3 pg (ref 26.0–34.0)
MCHC: 31.3 g/dL (ref 30.0–36.0)
MCV: 96.7 fL (ref 80.0–100.0)
Monocytes Absolute: 0.6 10*3/uL (ref 0.1–1.0)
Monocytes Relative: 7 %
Neutro Abs: 6.2 10*3/uL (ref 1.7–7.7)
Neutrophils Relative %: 79 %
Platelets: 189 10*3/uL (ref 150–400)
RBC: 2.41 MIL/uL — ABNORMAL LOW (ref 4.22–5.81)
RDW: 16.7 % — ABNORMAL HIGH (ref 11.5–15.5)
WBC: 7.8 10*3/uL (ref 4.0–10.5)
nRBC: 0 % (ref 0.0–0.2)

## 2021-09-11 LAB — SAMPLE TO BLOOD BANK

## 2021-09-11 MED ORDER — HEPARIN SOD (PORK) LOCK FLUSH 100 UNIT/ML IV SOLN
INTRAVENOUS | Status: AC
  Filled 2021-09-11: qty 5

## 2021-09-11 MED ORDER — HEPARIN SOD (PORK) LOCK FLUSH 100 UNIT/ML IV SOLN
500.0000 [IU] | Freq: Once | INTRAVENOUS | Status: AC
  Administered 2021-09-11: 500 [IU] via INTRAVENOUS
  Filled 2021-09-11: qty 5

## 2021-09-11 MED ORDER — GLIPIZIDE 5 MG PO TABS: 5.0000 mg | ORAL_TABLET | Freq: Two times a day (BID) | ORAL | 3 refills | Status: DC

## 2021-09-11 MED ORDER — FLUTICASONE-SALMETEROL 500-50 MCG/ACT IN AEPB: 1.0000 | INHALATION_SPRAY | Freq: Two times a day (BID) | RESPIRATORY_TRACT | 3 refills | Status: DC

## 2021-09-11 NOTE — Progress Notes (Signed)
DISCONTINUE ON PATHWAY REGIMEN - Non-Small Cell Lung     Administer weekly:     Paclitaxel      Carboplatin   **Always confirm dose/schedule in your pharmacy ordering system**  REASON: Continuation Of Treatment PRIOR TREATMENT: [id number]: Carboplatin AUC=2 + Paclitaxel 45 mg/m2 Weekly During Radiation TREATMENT RESPONSE: Stable Disease (SD)  START ON PATHWAY REGIMEN - Non-Small Cell Lung     A cycle is every 14 days:     Durvalumab   **Always confirm dose/schedule in your pharmacy ordering system**  Patient Characteristics: Preoperative or Nonsurgical Candidate (Clinical Staging), Stage III - Nonsurgical Candidate (Nonsquamous and Squamous), PS = 0, 1 Therapeutic Status: Preoperative or Nonsurgical Candidate (Clinical Staging) AJCC T Category: cT3 AJCC N Category: cN2 AJCC M Category: cM0 AJCC 8 Stage Grouping: IIIB ECOG Performance Status: 1 Intent of Therapy: Curative Intent, Discussed with Patient

## 2021-09-11 NOTE — Progress Notes (Signed)
Patient has a cough episodes and they only thing that has helped in the past is a prescription cough syrup that may be in process of being refilled by PCP.

## 2021-09-11 NOTE — Progress Notes (Addendum)
Nutrition Follow-up:  Patient with lung cancer.  Recent chest xray, MD notes reviewed regarding results.  Planning repeat chest xray in 2 weeks and possible starting durvalumab.    Met with patient and wife following MD visit.  Patient says that appetite is some better, improving.  Had 2 eggs, sausage and toast for breakfast yesterday. Lunch was [employer] chicken and dinner was hamburger steak and french fries.  Drinking shakes but not daily.     Medications: glipzide starting today  Labs: glucose 259  Anthropometrics:   Weight 193 lb today  191 lb on 11/17 193 lb on 10/24 195 lb on 10/10 197 lb on 9/26   NUTRITION DIAGNOSIS: Inadequate oral intake stable    INTERVENTION:  Encouraged oral nutrition supplement at least daily. Continue high calorie, high protein foods.   Complimentary case of ensure enlive given to patient today.     MONITORING, EVALUATION, GOAL: weight trends, intake   NEXT VISIT: to be determined with treatment   Rodney Vega B. Zenia Resides, [place], [place] Registered Dietitian [phone] (mobile)

## 2021-09-11 NOTE — Assessment & Plan Note (Addendum)
#  Right upper lobe lung mass -concerning for malignancy [biopsy inconclusive/atypical cells]- Stage III-s/p carbo Abraxane with radiation [finished end of Oct 2022]. DEC 7th CT-increase in size of the Right upper lobe lung mass ~10.5 x 6.5 cm [previously around 8 x 6 cm]-along with slight increase of the mediastinal adenopathy; and surrounding infiltrative changes-pneumonitis infectious versus inflammation [see below]  #Long discussion with the patient and wife and daughter- "progressive disease"-differential diagnosis includes worsening disease [clinically less likely]; chemoradiation changes; associated pneumonia.  However, however is difficult to prove 1 over the other at this time.  However would recommend reevaluation with a chest x-ray in 2 weeks.  #Plan proceed with immunotherapy-durvalumab in 2 weeks-clinically stable/improving.  Await chest x-ray.   #Anemia-hemoglobin 7.3; likely secondary to chemotherapy; not very symptomatic.  Hold off any PRBC transfusion.  #COPD/given the possibility of pneumonia/inflammatory changes noted on CT scan-agree with Augmentin for 3 more days [total 10 days]; and also incentive spirometry.  Continue compliance with albuterol.  Also ordered Advair.  However if-chest x-ray not improving/worsening-consider reevaluation with pulmonary for possible lung abscess.  Await chest x-ray 2 weeks.  #Right chest wall pain-on Tylenol/tramadol [Qhs-2; 5-6 tramadol a day]-  STABLE.   #Diabetes-253- Post coffe this AM; recommend glipizide 5 mg twice daily new prescription sent.  # IV mediport: functioning/STABLE  # DISPOSITION:appt thru mychart   # follow up in 2 weeks-NP-- labs- cbc/cmp;Imfinzi [new]; CXR prior- -Dr.B  # I reviewed the blood work- with the patient in detail; also reviewed the imaging independently [as summarized above]; and with the patient in detail.

## 2021-09-11 NOTE — Patient Instructions (Signed)
#  Please have the chest x-ray done 1 to 2 days before the next visit

## 2021-09-11 NOTE — Progress Notes (Signed)
[facility] CONSULT NOTE  Patient Care Team: [name], MD as PCP - General (Family Medicine) [name], RN as Oncology Nurse Navigator Orson Ape, MD as Consulting Physician (Urology) [name], MD as Consulting Physician (Oncology)  CHIEF COMPLAINTS/PURPOSE OF CONSULTATION: lung cancer    Oncology History Overview Note  AUG 2022- 8.2 x 5.9 cm spiculated mass noted in right upper lobe consistent with malignancy. It extends from the right hilum to the lateral chest wall and results in lytic destruction of the lateral portion of the right fourth rib.  Mediastinal lymph nodes are noted, including 12 mm right hilar lymph node, consistent with metastatic disease. 3 cm right adrenal mass is noted concerning for metastatic disease.  # AUG 2022- PET IMPRESSION: Peripherally hypermetabolic right upper lobe mass, likely due to central necrosis. Mass extends into the right hilum and right lateral chest wall involving the second through fourth right ribs.   Mildly enlarged and hypermetabolic right hilar lymph node.   No evidence of metastatic disease in the abdomen or pelvis.  # AUG, 2022-RIGHT CHEST WALL Bx-necrosis; suspicious for malignancy not definitive [discussed with Dr.Kraynie;MDT] LUNG MASS, RIGHT; BIOPSY:  - SUSPICIOUS FOR MALIGNANCY.  - SEE COMMENT.   Comment:  Approximately six tissue cores demonstrate inflamed fibrous capsule with  hemosiderin-laden macrophages, in a background of abundant necrosis.  One of the cores demonstrates a minute fragment of viable epithelial  cells.  These cells are positive for p40.  They are negative for TTF1  and CK7. The cells of interest disappear on deeper cut levels.  While  the findings are suspicious for squamous cell carcinoma, there is not  enough viable tumor for a definitive diagnosis.  Additional tissue  sampling may be helpful.   # SEP 2022-cycle #2 Taxol hypersensitive reaction.  Switched  over to #3 cycle- carbo-Abraxane starting 06/29/2021   Primary cancer of right upper lobe of lung (HCC)  06/03/2021 Initial Diagnosis   Primary cancer of right upper lobe of lung (HCC)   06/03/2021 Cancer Staging   Staging form: Lung, AJCC 8th Edition - Clinical: Stage IIIB (cT3, cN2, cM0) - Signed by [name], MD on 06/03/2021    06/15/2021 - 08/03/2021 Chemotherapy   Patient is on Treatment Plan : LUNG Carboplatin / Paclitaxel + XRT q7d     09/25/2021 -  Chemotherapy   Patient is on Treatment Plan : LUNG Durvalumab q14d        HISTORY OF PRESENTING ILLNESS: Ambulating independently.  Accompanied by wife.  Rodney Vega 65 y.o.  male history of smoking -"lung cancer" [Limited necrotic tissue]-stage III unresectable currently on chemoradiation- carbo-abraxane is here for follow-up/ review results of CT scan.   In the interim patient was evaluated by symptom management clinic for worsening shortness of breath or cough.  Chest x-ray concerning for pneumonia.  Patient started on Augmentin for 10 days.  Today is day 8th-day of Augmentin.  Overall coughing is improved.  However, patient not using albuterol as recommended.   Denies any worsening chest wall pain.  No nausea no vomiting.  No headaches..  Review of Systems  Constitutional:  Positive for malaise/fatigue. Negative for chills, diaphoresis, fever and weight loss.  HENT:  Negative for nosebleeds and sore throat.   Eyes:  Negative for double vision.  Respiratory:  Positive for shortness of breath. Negative for cough, hemoptysis, sputum production and wheezing.   Cardiovascular:  Negative for chest pain, palpitations, orthopnea and leg swelling.  Gastrointestinal:  Positive for nausea. Negative for abdominal pain, blood in stool, constipation, diarrhea, heartburn, melena and vomiting.  Genitourinary:  Negative for dysuria, frequency and urgency.  Musculoskeletal:  Negative for back pain and joint pain.  Skin: Negative.   Negative for itching and rash.  Neurological:  Positive for tingling. Negative for dizziness, focal weakness, weakness and headaches.  Endo/Heme/Allergies:  Does not bruise/bleed easily.  Psychiatric/Behavioral:  Negative for depression. The patient is not nervous/anxious and does not have insomnia.     MEDICAL HISTORY:  Past Medical History:  Diagnosis Date  . Cancer ([place])   . Degenerative arthritis of knee   . Depression   . Diverticulitis   . Glaucoma    since 2004  . Hernia 1979  . History of kidney stones   . Hypertension   . Personal history of tobacco use, presenting hazards to health   . Pre-diabetes   . Screening for obesity   . Special screening for malignant neoplasms, colon   . Wears dentures    full upper and lower    SURGICAL HISTORY: Past Surgical History:  Procedure Laterality Date  . [place], 2012   lumbar bulging disc  . CATARACT EXTRACTION W/PHACO Left 01/20/2021   Procedure: CATARACT EXTRACTION PHACO AND INTRAOCULAR LENS PLACEMENT ([place]) LEFT;  Surgeon: Birder Robson, MD;  Location: [facility];  Service: Ophthalmology;  Laterality: Left;  10.13 0:52.1  . COLONOSCOPY  1027,2536   [facility]/Dr. Buelah Manis sessile polyp,29mm, found in rectum,multiple diverticula were found in the sigmoid, in descending and in transverse colon  . COLOSTOMY  04-20-13  . HERNIA REPAIR  1979   inguinal  . IR IMAGING GUIDED PORT INSERTION  06/10/2021  . JOINT REPLACEMENT Right    knee  . KNEE ARTHROPLASTY Right 05/08/2018   Procedure: COMPUTER ASSISTED TOTAL KNEE ARTHROPLASTY;  Surgeon: Dereck Leep, MD;  Location: [facility];  Service: Orthopedics;  Laterality: Right;  . KNEE ARTHROPLASTY Left 11/16/2019   Procedure: COMPUTER ASSISTED TOTAL KNEE ARTHROPLASTY;  Surgeon: Dereck Leep, MD;  Location: [facility];  Service: Orthopedics;  Laterality: Left;  . LAPAROTOMY  04-20-13   colon resection with colosotmy  . LITHOTRIPSY  2013  . TONSILLECTOMY AND ADENOIDECTOMY   1962    SOCIAL HISTORY: Social History   Socioeconomic History  . Marital status: Married    Spouse name: Not on file  . Number of children: Not on file  . Years of education: Not on file  . Highest education level: Not on file  Occupational History  . Not on file  Tobacco Use  . Smoking status: Every Day    Packs/day: 1.00    Years: 20.00    Pack years: 20.00    Types: Cigarettes  . Smokeless tobacco: Never  . Tobacco comments:    since age 77 or 48  Vaping Use  . Vaping Use: Never used  Substance and Sexual Activity  . Alcohol use: Yes    Comment: occassional  . Drug use: Yes    Types: Marijuana  . Sexual activity: Not on file  Other Topics Concern  . Not on file  Social History Narrative   15 mins [place]; smoking; not much alcohol. Worked for [employer], 2019. Marland Kitchen    Social Determinants of Health   Financial Resource Strain: Not on file  Food Insecurity: Not on file  Transportation Needs: Not on file  Physical Activity: Not on file  Stress: Not on file  Social Connections: Not  on file  Intimate Partner Violence: Not on file    FAMILY HISTORY: Family History  Problem Relation Age of Onset  . Diabetes Mother   . Heart disease Mother   . Heart attack Father   . Prostate cancer Father     ALLERGIES:  is allergic to paclitaxel, ambien [zolpidem], and aleve [naproxen sodium].  MEDICATIONS:  Current Outpatient Medications  Medication Sig Dispense Refill  . acetaminophen (TYLENOL) 500 MG tablet Take 1,000 mg by mouth daily.    Marland Kitchen albuterol (VENTOLIN HFA) 108 (90 Base) MCG/ACT inhaler Inhale 2 puffs into the lungs every 6 (six) hours as needed for wheezing or shortness of breath. 1 each 2  . amoxicillin-clavulanate (AUGMENTIN) 875-125 MG tablet Take 1 tablet by mouth 2 (two) times daily. 20 tablet 0  . blood glucose meter kit and supplies KIT Check your blood glucose levels twice a day; once in the morning before breakfast; and once in the  evening after dinner. 1 each 0  . cetirizine (ZYRTEC) 10 MG tablet Take 10 mg by mouth daily.    . diphenhydramine-acetaminophen (TYLENOL PM) 25-500 MG TABS Take 1-2 tablets by mouth at bedtime as needed (sleep.).     Marland Kitchen dorzolamide-timolol (COSOPT) 22.3-6.8 MG/ML ophthalmic solution Place 1 drop into both eyes 2 (two) times daily.    . fluticasone-salmeterol (ADVAIR DISKUS) 500-50 MCG/ACT AEPB Inhale 1 puff into the lungs in the morning and at bedtime. 1 each 3  . glucose blood test strip Check your blood glucose levels twice a day; once in the morning before breakfast; and once in the evening after dinner. 100 each 12  . Lancets (FREESTYLE) lancets Check your blood glucose levels twice a day; once in the morning before breakfast; and once in the evening after dinner. 100 each 12  . latanoprost (XALATAN) 0.005 % ophthalmic solution Place 1 drop into both eyes at bedtime.    . montelukast (SINGULAIR) 10 MG tablet TAKE 1 TABLET(10 MG) BY MOUTH AT BEDTIME 90 tablet 0  . olmesartan (BENICAR) 40 MG tablet Take 40 mg by mouth daily.    . pregabalin (LYRICA) 50 MG capsule Take 1 capsule (50 mg total) by mouth 2 (two) times daily. 60 capsule 1  . venlafaxine XR (EFFEXOR-XR) 75 MG 24 hr capsule Take 225 mg by mouth daily.    . Baclofen 5 MG TABS TAKE 1 TABLET(5 MG) BY MOUTH EVERY NIGHT AS NEEDED FOR PAIN (Patient not taking: Reported on 07/27/2021)    . Cyanocobalamin (VITAMIN B-12 PO) Take by mouth daily. (Patient not taking: Reported on 07/27/2021)    . glipiZIDE (GLUCOTROL) 5 MG tablet Take 1 tablet (5 mg total) by mouth 2 (two) times daily before a meal. 60 tablet 3  . guaiFENesin (MUCINEX) 600 MG 12 hr tablet Take by mouth 2 (two) times daily as needed. (Patient not taking: Reported on 07/27/2021)    . nicotine (NICODERM CQ - DOSED IN MG/24 HOURS) 21 mg/24hr patch Place 1 patch (21 mg total) onto the skin daily. (Patient not taking: Reported on 08/17/2021) 28 patch 1  . sucralfate (CARAFATE) 1 g tablet  Take 1 tablet (1 g total) by mouth 3 (three) times daily. Dissolve in 3-4 tbsp warm water, swish and swallow (Patient not taking: Reported on 07/27/2021) 90 tablet 1  . traMADol (ULTRAM) 50 MG tablet TAKE 1 TABLET(50 MG) BY MOUTH EVERY 8 HOURS AS NEEDED (Patient not taking: Reported on 08/17/2021) 60 tablet 0  . traZODone (DESYREL) 50 MG tablet TAKE 1  TABLET(50 MG) BY MOUTH AT BEDTIME AS NEEDED FOR SLEEP (Patient not taking: Reported on 07/27/2021) 30 tablet 1   No current facility-administered medications for this visit.   Facility-Administered Medications Ordered in Other Visits  Medication Dose Route Frequency Provider Last Rate Last Admin  . heparin lock flush 100 UNIT/ML injection           . heparin lock flush 100 UNIT/ML injection               .  PHYSICAL EXAMINATION: ECOG PERFORMANCE STATUS: 1 - Symptomatic but completely ambulatory  Vitals:   09/11/21 1023  BP: 118/68  Pulse: 92  Resp: 16  Temp: 98.2 F (36.8 C)    Filed Weights   09/11/21 1023  Weight: 193 lb 4.8 oz (87.7 kg)     Physical Exam Vitals and nursing note reviewed.  HENT:     Head: Normocephalic and atraumatic.     Mouth/Throat:     Pharynx: Oropharynx is clear.  Eyes:     Extraocular Movements: Extraocular movements intact.     Pupils: Pupils are equal, round, and reactive to light.  Cardiovascular:     Rate and Rhythm: Normal rate and regular rhythm.  Pulmonary:     Comments: Decreased breath sounds bilaterally.  Abdominal:     Palpations: Abdomen is soft.  Musculoskeletal:        General: Normal range of motion.     Cervical back: Normal range of motion.  Skin:    General: Skin is warm.  Neurological:     General: No focal deficit present.     Mental Status: He is alert and oriented to person, place, and time.  Psychiatric:        Behavior: Behavior normal.        Judgment: Judgment normal.     LABORATORY DATA:  I have reviewed the data as listed Lab Results  Component Value  Date   WBC 7.8 09/11/2021   HGB 7.3 (L) 09/11/2021   HCT 23.3 (L) 09/11/2021   MCV 96.7 09/11/2021   PLT 189 09/11/2021   Recent Labs    08/26/21 0855 09/02/21 1345 09/11/21 0947  NA 134* 136 135  K 4.1 3.3* 3.3*  CL 99 104 99  CO2 $Re'26 27 25  'KaP$ GLUCOSE 303* 193* 259*  BUN $Re'12 10 10  'qlL$ CREATININE 0.93 0.80 0.84  CALCIUM 8.9 8.5* 8.5*  GFRNONAA >60 >60 >60  PROT 7.6 6.7 6.9  ALBUMIN 2.8* 2.5* 2.6*  AST 32 22 21  ALT 35 31 19  ALKPHOS 110 95 85  BILITOT <0.1* 0.5 0.5    RADIOGRAPHIC STUDIES: I have personally reviewed the radiological images as listed and agreed with the findings in the report. DG Chest 2 View  Result Date: 09/03/2021 CLINICAL DATA:  Right lung cancer.  Fever. EXAM: CHEST - 2 VIEW COMPARISON:  May 29, 2021. FINDINGS: The heart size and mediastinal contours are within normal limits. Left lung is clear. Interval placement of left internal jugular Port-A-Cath with distal tip in expected position of cavoatrial junction. No pneumothorax is noted. Increased right upper lobe airspace opacity is noted consistent with right lung mass which may be increasing in size with associated postobstructive atelectasis or infiltrate. Lytic destruction of right fourth rib is noted. IMPRESSION: Increased opacity is noted in right upper lobe concerning for enlarging mass or malignancy and associated postobstructive atelectasis or pneumonia. Lytic destruction of right fourth rib is noted secondary to malignancy. Electronically Signed   By:  Marijo Conception M.D.   On: 09/03/2021 16:58   CT CHEST W CONTRAST  Result Date: 09/10/2021 CLINICAL DATA:  Metastatic non-small cell lung cancer, assess treatment response EXAM: CT CHEST WITH CONTRAST TECHNIQUE: Multidetector CT imaging of the chest was performed during intravenous contrast administration. CONTRAST:  53mL OMNIPAQUE IOHEXOL 300 MG/ML  SOLN COMPARISON:  CT chest, 05/19/2021, PET-CT, 05/25/2021 FINDINGS: Cardiovascular: Left chest port  catheter. Aortic atherosclerosis. Normal heart size. Left and right coronary artery calcifications. No pericardial effusion. Mediastinum/Nodes: Interval enlargement of a subcarinal lymph node measuring 2.0 x 1.1 cm, previously 1.5 x 0.6 cm (series 2, image 77). Prominent pretracheal lymph nodes are unchanged measuring up to 1.1 x 0.9 cm (series 2, image 64). Thyroid gland, trachea, and esophagus demonstrate no significant findings. Lungs/Pleura: There has been an apparent interval increase in size of a large necrotic mass of the peripheral right upper lobe, now measuring approximately 10.4 x 6.7 cm, previously 8.2 x 5.9 cm when measured similarly (series 2, image 56). There are internal air loculations. There is new irregular ground-glass airspace opacity and consolidation of the medial right upper lobe (series 3, image 48) and superior segment left lower lobe (series 3, image 66). Additional new small nodules throughout the dependent right lower lobe (series 3, image 98). Mild centrilobular and paraseptal emphysema. Background of very fine centrilobular pulmonary nodules, most concentrated in the lung apices. No pleural effusion or pneumothorax. Upper Abdomen: No acute abnormality. Stable, benign right adrenal adenoma. Musculoskeletal: Redemonstrated bony destruction of the lateral right third and fourth ribs underlying mass. IMPRESSION: 1. There has been an apparent interval increase in size of a large necrotic mass of the peripheral right upper lobe, now measuring approximately 10.4 x 6.7 cm, previously 8.2 x 5.9 cm when measured similarly. Enlargement the overall lesion is generally concerning for worsened malignancy, however the volume of tissue is likely reduced, given substantial volume of internal necrotic fluid. This could be assessed for residual metabolic activity by PET-CT if desired. 2. Interval enlargement of a subcarinal lymph node. Prominent pretracheal lymph nodes are unchanged. This is worrisome  for worsened nodal metastatic disease, although potentially reactive given the presence of infection or inflammation in the right lung described below. As above, this could be characterized for residual metabolic activity by PET-CT if desired. 3. New irregular ground-glass airspace opacity and consolidation of the medial right upper lobe and superior segment left lower lobe. Additional new small nodules throughout the dependent right lower lobe. Findings are consistent with multifocal infection. 4. Redemonstrated bony destruction of the lateral right third and fourth ribs underlying mass. 5. Emphysema. 6. Coronary artery disease. Aortic Atherosclerosis (ICD10-I70.0) and Emphysema (ICD10-J43.9). Electronically Signed   By: Delanna Ahmadi M.D.   On: 09/10/2021 13:03   US RENAL  Result Date: 08/26/2021 CLINICAL DATA:  Hematuria.  History of kidney stones. EXAM: RENAL / URINARY TRACT ULTRASOUND COMPLETE COMPARISON:  None. FINDINGS: Right Kidney: Renal measurements: 10.9 x 5.4 x 5.5 cm = volume: 167 mL. Echogenicity within normal limits. No mass or hydronephrosis visualized. Left Kidney: Renal measurements: 12.4 x 5.4 x 6.3 cm = volume: 221 mL. Normal echogenicity. No shadowing stone. Mild hydronephrosis. Bladder: Appears normal for degree of bladder distention. Bilateral ureteral jets noted. Other: There is a 2.7 x 2.4 x 2.9 cm indeterminate rounded lesion in the region of the right adrenal gland. IMPRESSION: 1. Mild left hydronephrosis. 2. A rounded right adrenal nodule, not characterized on this ultrasound. Similar finding was seen on the chest  CT of 05/19/2021. Electronically Signed   By: Anner Crete M.D.   On: 08/26/2021 22:54    ASSESSMENT & PLAN:   Primary cancer of right upper lobe of lung ([place]) #Right upper lobe lung mass -concerning for malignancy [biopsy inconclusive/atypical cells]- Stage III-s/p carbo Abraxane with radiation [finished end of Oct 2022]. DEC 7th CT-increase in size of the Right  upper lobe lung mass ~10.5 x 6.5 cm [previously around 8 x 6 cm]-along with slight increase of the mediastinal adenopathy; and surrounding infiltrative changes-pneumonitis infectious versus inflammation [see below]  #Long discussion with the patient and wife and daughter- "progressive disease"-differential diagnosis includes worsening disease [clinically less likely]; chemoradiation changes; associated pneumonia.  However, however is difficult to prove 1 over the other at this time.  However would recommend reevaluation with a chest x-ray in 2 weeks.  #Plan proceed with immunotherapy-durvalumab in 2 weeks-clinically stable/improving.  Await chest x-ray.   #Anemia-hemoglobin 7.3; likely secondary to chemotherapy; not very symptomatic.  Hold off any PRBC transfusion.  #COPD/given the possibility of pneumonia/inflammatory changes noted on CT scan-agree with Augmentin for 3 more days [total 10 days]; and also incentive spirometry.  Continue compliance with albuterol.  Also ordered Advair.  However if-chest x-ray not improving/worsening-consider reevaluation with pulmonary for possible lung abscess.  Await chest x-ray 2 weeks.  #Right chest wall pain-on Tylenol/tramadol [Qhs-2; 5-6 tramadol a day]-  STABLE.   #Diabetes-253- Post coffe this AM; recommend glipizide 5 mg twice daily new prescription sent.  # IV mediport: functioning/STABLE  # DISPOSITION:appt thru mychart   # follow up in 2 weeks-NP-- labs- cbc/cmp;Imfinzi [new]; CXR prior- -Dr.B  # I reviewed the blood work- with the patient in detail; also reviewed the imaging independently [as summarized above]; and with the patient in detail.   All questions were answered. The patient knows to call the clinic with any problems, questions or concerns.    [name], MD 09/11/2021 12:30 PM

## 2021-09-14 LAB — CULTURE, BLOOD (ROUTINE X 2)
Culture: NO GROWTH
Special Requests: ADEQUATE

## 2021-09-16 NOTE — Progress Notes (Signed)
Pharmacist Chemotherapy Monitoring - Initial Assessment    Anticipated start date: 09/25/21   The following has been reviewed per standard work regarding the patient's treatment regimen: The patient's diagnosis, treatment plan and drug doses, and organ/hematologic function Lab orders and baseline tests specific to treatment regimen  The treatment plan start date, drug sequencing, and pre-medications Prior authorization status  Patient's documented medication list, including drug-drug interaction screen and prescriptions for anti-emetics and supportive care specific to the treatment regimen The drug concentrations, fluid compatibility, administration routes, and timing of the medications to be used The patient's access for treatment and lifetime cumulative dose history, if applicable  The patient's medication allergies and previous infusion related reactions, if applicable   Changes made to treatment plan:  N/A  Follow up needed:  adding lab orders- tsh   Rodney Vega, [place], 09/16/2021  10:49 AM

## 2021-09-17 ENCOUNTER — Other Ambulatory Visit: Payer: Self-pay | Admitting: Urology

## 2021-09-17 ENCOUNTER — Other Ambulatory Visit (HOSPITAL_COMMUNITY): Payer: Self-pay | Admitting: Urology

## 2021-09-17 DIAGNOSIS — N1330 Unspecified hydronephrosis: Secondary | ICD-10-CM

## 2021-09-17 DIAGNOSIS — R3121 Asymptomatic microscopic hematuria: Secondary | ICD-10-CM

## 2021-09-18 ENCOUNTER — Telehealth: Payer: Self-pay | Admitting: *Deleted

## 2021-09-18 ENCOUNTER — Ambulatory Visit
Admission: RE | Admit: 2021-09-18 | Discharge: 2021-09-18 | Disposition: A | Discharge status: Home / Self Care | Payer: Medicare PPO | Source: Ambulatory Visit | Attending: Internal Medicine | Admitting: Internal Medicine

## 2021-09-18 ENCOUNTER — Ambulatory Visit
Admission: RE | Admit: 2021-09-18 | Discharge: 2021-09-18 | Disposition: A | Discharge status: Home / Self Care | Payer: Medicare PPO | Attending: Internal Medicine | Admitting: Internal Medicine

## 2021-09-18 DIAGNOSIS — C3411 Malignant neoplasm of upper lobe, right bronchus or lung: Secondary | ICD-10-CM | POA: Diagnosis not present

## 2021-09-18 NOTE — Telephone Encounter (Signed)
Wife called reporting that patient has started running intermittent fevers again since he has completed the antibiotics for post obstructive pneumonia he was given. He is asymptomatic and has been even before the antibiotics were ordered. She states she knows it is not a UTI because he just saw Dr Bernardo Heater and had urine checked. She states his temp runs between 99.9-100.8. She is asking if he needs to have more antibiotics. Please advise

## 2021-09-18 NOTE — Addendum Note (Signed)
Addended by: Irean Hong on: 09/18/2021 04:31 PM   Modules accepted: Orders

## 2021-09-18 NOTE — Telephone Encounter (Signed)
I spoke with wife and reviewed results of CXR (no acute findings). Fever reportedly improved with abx and then has returned mostly at night with tmax 100.5. PCP mentioned ID consult to wife and so will go ahead and make referral. Patient is otherwise asymptomatic at this point. It is possible that this reflects tumor fever.

## 2021-09-22 ENCOUNTER — Ambulatory Visit: Payer: Medicare PPO | Attending: Infectious Diseases | Admitting: Infectious Diseases

## 2021-09-22 ENCOUNTER — Other Ambulatory Visit: Payer: Self-pay

## 2021-09-22 ENCOUNTER — Encounter: Payer: Self-pay | Admitting: Infectious Diseases

## 2021-09-22 VITALS — BP 117/68 | HR 98 | Temp 98.1°F | Resp 16 | Ht 71.0 in | Wt 189.0 lb

## 2021-09-22 DIAGNOSIS — E119 Type 2 diabetes mellitus without complications: Secondary | ICD-10-CM | POA: Insufficient documentation

## 2021-09-22 DIAGNOSIS — Z7984 Long term (current) use of oral hypoglycemic drugs: Secondary | ICD-10-CM | POA: Insufficient documentation

## 2021-09-22 DIAGNOSIS — F17210 Nicotine dependence, cigarettes, uncomplicated: Secondary | ICD-10-CM | POA: Insufficient documentation

## 2021-09-22 DIAGNOSIS — C3411 Malignant neoplasm of upper lobe, right bronchus or lung: Secondary | ICD-10-CM | POA: Insufficient documentation

## 2021-09-22 DIAGNOSIS — D649 Anemia, unspecified: Secondary | ICD-10-CM | POA: Insufficient documentation

## 2021-09-22 DIAGNOSIS — Z9221 Personal history of antineoplastic chemotherapy: Secondary | ICD-10-CM | POA: Insufficient documentation

## 2021-09-22 DIAGNOSIS — Z923 Personal history of irradiation: Secondary | ICD-10-CM | POA: Insufficient documentation

## 2021-09-22 DIAGNOSIS — R509 Fever, unspecified: Secondary | ICD-10-CM | POA: Insufficient documentation

## 2021-09-22 NOTE — Progress Notes (Signed)
NAME: Rodney Vega  DOB: 08-17-1956  MRN: [id number]  Date/Time: 09/22/2021 11:28 AM  Subjective:  Patient is referred to me by Dr. Rogue Bussing for fever.  Rodney Vega is a 65 y.o. 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  The CT scan then showed 8.2 into 5.9 cm spiculated mass in the right upper lobe extending from the right hilum to the lateral chest wall and resulting in lytic destruction of the lateral portion of the right fourth rib.  Mediastinal lymph nodes were noted.  And right hilar lymph nodes were also enlarged consistent with metastatic disease.  A PET scan done on 05/25/2021 revealed hypermetabolic right upper lobe mass with central necrosis. On 05/29/2021 he underwent CT-guided lung biopsy.  The pathology demonstrated inflamed fibrous capsule with hemosiderin laden macrophages in a background of abundant necrosis.  One of the core demonstrates a minute fragment of viable epithelial cells.  The cells are positive for p40.  They are negative for TTF-1 and CK7.  The cells of interest disappears on deeper Levels.  While the findings are suspicious for squamous cell carcinoma there is not enough viable tumor for definitive diagnosis. Pathologist recommended additional tissue sampling.  The case was discussed in tumor board and it was decided to treat him like squamous cell carcinoma by Dr. Rogue Bussing. He received chemotherapy from 06/15/2021 until 08/03/2021 with carboplatin and paclitaxel.  He also received radiation therapy which she completed on 08/02/2021. As per wife and patient started developing fever the first week of November. The fever has ranged from 100.2-and occasionally on 1 incident of 101. He was treated with 2 courses of antibiotics.  Doxycycline and Augmentin. The doxycycline did not do much.  The Augmentin seemed to have helped but  the fevers recurred.  When I asked the patient he does not feel hot or chills or rigors..  He does not have any night sweats He has not had any worsening cough or worsening productive sputum No hemoptysis Has some fatigue has not gotten worse. His wife takes his temperature 6 times a day.  She is afraid that he may get into sepsis like he did with the diverticulitis. Patient is waiting to start PD-L1 on Friday He had another CAT scan done on 09/09/2021 which showed increase in the size of the mass  Past Medical History:  Diagnosis Date   Cancer ([place])    Degenerative arthritis of knee    Depression    Diverticulitis    Glaucoma    since 2004   Hernia 1979   History of kidney stones    Hypertension    Personal history of tobacco use, presenting hazards to health    Pre-diabetes    Screening for obesity    Special screening for malignant neoplasms, colon    Wears dentures    full upper and lower    Past Surgical History:  Procedure Laterality Date   [place], 2012   lumbar bulging disc   CATARACT EXTRACTION W/PHACO Left 01/20/2021   Procedure: CATARACT EXTRACTION PHACO AND INTRAOCULAR LENS PLACEMENT ([place]) LEFT;  Surgeon: Birder Robson, MD;  Location: [facility];  Service: Ophthalmology;  Laterality: Left;  10.13 0:52.1   COLONOSCOPY  [id number]   [facility]/Dr. Buelah Manis sessile polyp,69m, found in rectum,multiple diverticula were found in the sigmoid, in descending and in transverse colon   COLOSTOMY  04-20-13  HERNIA REPAIR  1979  ° inguinal  ° IR IMAGING GUIDED PORT INSERTION  06/10/2021  ° JOINT REPLACEMENT Right   ° knee  ° KNEE ARTHROPLASTY Right 05/08/2018  ° Procedure: COMPUTER ASSISTED TOTAL KNEE ARTHROPLASTY;  Surgeon: Hooten, James P, MD;  Location: [facility];  Service: Orthopedics;  Laterality: Right;  ° KNEE ARTHROPLASTY Left 11/16/2019  ° Procedure: COMPUTER ASSISTED TOTAL KNEE ARTHROPLASTY;  Surgeon: Hooten, James P, MD;  Location: [facility];  Service:  Orthopedics;  Laterality: Left;  ° LAPAROTOMY  04-20-13  ° colon resection with colosotmy  ° LITHOTRIPSY  2013  ° TONSILLECTOMY AND ADENOIDECTOMY  1962  °  °Social History  ° °Socioeconomic History  ° Marital status: Married  °  Spouse name: Not on file  ° Number of children: Not on file  ° Years of education: Not on file  ° Highest education level: Not on file  °Occupational History  ° Not on file  °Tobacco Use  ° Smoking status: Every Day  °  Packs/day: 1.00  °  Years: 20.00  °  Pack years: 20.00  °  Types: Cigarettes  ° Smokeless tobacco: Never  ° Tobacco comments:  °  since age 16 or 17  °Vaping Use  ° Vaping Use: Never used  °Substance and Sexual Activity  ° Alcohol use: Yes  °  Comment: occassional  ° Drug use: Yes  °  Types: Marijuana  ° Sexual activity: Not on file  °Other Topics Concern  ° Not on file  °Social History Narrative  ° [place] mins [place]; smoking; not much alcohol. 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 .   ° °Social Determinants of Health  ° °Financial Resource Strain: Not on file  °Food Insecurity: Not on file  °Transportation Needs: Not on file  °Physical Activity: Not on file  °Stress: Not on file  °Social Connections: Not on file  °Intimate Partner Violence: Not on file  °  °Family History  °Problem Relation Age of Onset  ° Diabetes Mother   ° Heart disease Mother   ° Heart attack Father   ° Prostate cancer Father   ° °Allergies  °Allergen Reactions  ° Paclitaxel Shortness Of Breath  ° Ambien [Zolpidem]   °  Causes confusion   ° Aleve [Naproxen Sodium] Itching  ° °I? °Current Outpatient Medications  °Medication Sig Dispense Refill  ° acetaminophen (TYLENOL) 500 MG tablet Take 1,000 mg by mouth daily.    ° albuterol (VENTOLIN HFA) 108 (90 Base) MCG/ACT inhaler Inhale 2 puffs into the lungs every 6 (six) hours as needed for wheezing or shortness of breath. 1 each 2  ° amoxicillin-clavulanate (AUGMENTIN) 875-125 MG tablet Take 1 tablet by mouth 2 (two) times daily. 20 tablet 0  °  Baclofen 5 MG TABS     ° blood glucose meter kit and supplies KIT Check your blood glucose levels twice a day; once in the morning before breakfast; and once in the evening after dinner. 1 each 0  ° cetirizine (ZYRTEC) 10 MG tablet Take 10 mg by mouth daily.    ° Cyanocobalamin (VITAMIN B-12 PO) Take by mouth daily.    ° diphenhydramine-acetaminophen (TYLENOL PM) 25-500 MG TABS Take 1-2 tablets by mouth at bedtime as needed (sleep.).     ° dorzolamide-timolol (COSOPT) 22.3-6.8 MG/ML ophthalmic solution Place 1 drop into both eyes 2 (two) times daily.    ° fluticasone-salmeterol (ADVAIR DISKUS) 500-50 MCG/ACT AEPB Inhale 1 puff into the lungs in the morning and   at bedtime. 1 each 3  ° glipiZIDE (GLUCOTROL) 5 MG tablet Take 1 tablet (5 mg total) by mouth 2 (two) times daily before a meal. 60 tablet 3  ° glucose blood test strip Check your blood glucose levels twice a day; once in the morning before breakfast; and once in the evening after dinner. 100 each 12  ° guaiFENesin (MUCINEX) 600 MG 12 hr tablet Take by mouth 2 (two) times daily as needed.    ° Lancets (FREESTYLE) lancets Check your blood glucose levels twice a day; once in the morning before breakfast; and once in the evening after dinner. 100 each 12  ° latanoprost (XALATAN) 0.005 % ophthalmic solution Place 1 drop into both eyes at bedtime.    ° montelukast (SINGULAIR) 10 MG tablet TAKE 1 TABLET(10 MG) BY MOUTH AT BEDTIME 90 tablet 0  ° nicotine (NICODERM CQ - DOSED IN MG/24 HOURS) 21 mg/24hr patch Place 1 patch (21 mg total) onto the skin daily. 28 patch 1  ° olmesartan (BENICAR) 40 MG tablet Take 40 mg by mouth daily.    ° pregabalin (LYRICA) 50 MG capsule Take 1 capsule (50 mg total) by mouth 2 (two) times daily. 60 capsule 1  ° sucralfate (CARAFATE) 1 g tablet Take 1 tablet (1 g total) by mouth 3 (three) times daily. Dissolve in 3-4 tbsp warm water, swish and swallow 90 tablet 1  ° traZODone (DESYREL) 50 MG tablet TAKE 1 TABLET(50 MG) BY MOUTH AT BEDTIME  AS NEEDED FOR SLEEP 30 tablet 1  ° venlafaxine XR (EFFEXOR-XR) 75 MG 24 hr capsule Take 225 mg by mouth daily.    ° traMADol (ULTRAM) 50 MG tablet TAKE 1 TABLET(50 MG) BY MOUTH EVERY 8 HOURS AS NEEDED (Patient not taking: Reported on 09/22/2021) 60 tablet 0  ° °No current facility-administered medications for this visit.  ° °Facility-Administered Medications Ordered in Other Visits  °Medication Dose Route Frequency Provider Last Rate Last Admin  ° heparin lock flush 100 UNIT/ML injection           ° heparin lock flush 100 UNIT/ML injection           °  ° °Abtx:  °Anti-infectives (From admission, onward)  ° ° None  ° °  ° ° °REVIEW OF SYSTEMS:  °Const: Negative for subjective feeling of fever but on recording he has a temperature negative chills, negative weight loss °Eyes: negative diplopia or visual changes, negative eye pain °ENT: negative coryza, negative sore throat °Resp:  cough, no hemoptysis, minimal dyspnea °Cards: Right sided chest pain, no palpitations, lower extremity edema °GU: negative for frequency, dysuria and hematuria °GI: Negative for abdominal pain, diarrhea, bleeding, constipation °Skin: negative for rash and pruritus °Heme: negative for easy bruising and gum/nose bleeding °MS: Fatigue °Neurolo:negative for headaches, dizziness, vertigo, memory problems  °Psych: negative for feelings of anxiety, depression  °Endocrine:  diabetes °Allergy/Immunology-as listed above. °Objective:  °VITALS:  °BP 117/68    Pulse 98    Temp 98.1 °F (36.7 °C) (Oral)    Resp 16    Ht 5' 11" (1.803 m)    Wt 189 lb (85.7 kg)    SpO2 98%    BMI 26.36 kg/m²  °PHYSICAL EXAM:  °General: Alert, cooperative, no distress, appears stated age.  °Head: Normocephalic, without obvious abnormality, atraumatic. °Eyes: Conjunctivae clear, anicteric sclerae. Pupils are equal °ENT Nares normal. No drainage or sinus tenderness. °Lips, mucosa, and tongue normal. No Thrush °Neck: Supple, symmetrical, no adenopathy, thyroid: non tender °no    carotid bruit and no JVD. °Back: No CVA tenderness. °Lungs: Bilateral air entry °Heart: Regular rate and rhythm, no murmur, rub or gallop. °Abdomen: Soft, non-tender,not distended. Bowel sounds normal. No masses.  Colostomy.  Lap scar. °Extremities: atraumatic, no cyanosis. No edema. No clubbing °Skin: No rashes or lesions. Or bruising °Lymph: Cervical, supraclavicular normal. °Neurologic: Grossly non-focal °Port in place °Pertinent Labs °Lab Results °CBC °   °Component Value Date/Time  ° WBC 7.8 09/11/2021 0947  ° RBC 2.41 (L) 09/11/2021 0947  ° HGB 7.3 (L) 09/11/2021 0947  ° HGB 12.5 (L) 09/21/2013 0522  ° HCT 23.3 (L) 09/11/2021 0947  ° HCT 36.3 (L) 09/21/2013 0522  ° PLT 189 09/11/2021 0947  ° PLT 185 09/21/2013 0522  ° MCV 96.7 09/11/2021 0947  ° MCV 91 09/21/2013 0522  ° MCH 30.3 09/11/2021 0947  ° MCHC 31.3 09/11/2021 0947  ° RDW 16.7 (H) 09/11/2021 0947  ° RDW 13.7 09/21/2013 0522  ° LYMPHSABS 0.7 09/11/2021 0947  ° LYMPHSABS 1.4 09/21/2013 0522  ° MONOABS 0.6 09/11/2021 0947  ° MONOABS 0.9 09/21/2013 0522  ° EOSABS 0.3 09/11/2021 0947  ° EOSABS 0.0 09/21/2013 0522  ° BASOSABS 0.0 09/11/2021 0947  ° BASOSABS 0.1 09/21/2013 0522  ° ° °CMP Latest Ref Rng & Units 09/11/2021 09/02/2021 08/26/2021  °Glucose 70 - 99 mg/dL 259(H) 193(H) 303(H)  °BUN 8 - 23 mg/dL 10 10 12  °Creatinine 0.61 - 1.24 mg/dL 0.84 0.80 0.93  °Sodium 135 - 145 mmol/L 135 136 134(L)  °Potassium 3.5 - 5.1 mmol/L 3.3(L) 3.3(L) 4.1  °Chloride 98 - 111 mmol/L 99 104 99  °CO2 22 - 32 mmol/L 25 27 26  °Calcium 8.9 - 10.3 mg/dL 8.5(L) 8.5(L) 8.9  °Total Protein 6.5 - 8.1 g/dL 6.9 6.7 7.6  °Total Bilirubin 0.3 - 1.2 mg/dL 0.5 0.5 <0.1(L)  °Alkaline Phos 38 - 126 U/L 85 95 110  °AST 15 - 41 U/L 21 22 32  °ALT 0 - 44 U/L 19 31 35  ° ° ° ° °Microbiology: °No results found for this or any previous visit (from the past 240 hour(s)). ° °IMAGING RESULTS: ° °I have personally reviewed the films °? 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  Patient is is not complaining of fever but temperature recorded has been anywhere between 100 200.1. °This fever could come from tumor fever secondary to the necrosis or less likely a postobstructive pneumonia. °Opportunistic infections like fungal is less likely TB is less likely °Patient is having bloods drawn this week and we could add beta D glucan, procalcitonin, fungal antibodies and QuantiFERON gold just to rule out other reasons for fever. 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   ° °Anemia °Diabetes mellitus on glipizide °__________________________________________________ °Discussed with patient, and his wife in great detail.  Communicated with Dr. Brahmanday's   NP Mr. Regenia Skeeter. Follow PRN  note:  This document was prepared using Dragon voice recognition software and may include unintentional dictation errors.

## 2021-09-22 NOTE — Patient Instructions (Addendum)
You are here for fever, you are being treated for lung cancer. The fever could be from necrosis of the tumor or post obstructive chnages in lung .Check temp only of you feel hot, sweaty, or more cough or SOB- you dont need antibiotic routinely- we need to do some blood work and I told your doctor to draw it with next labs

## 2021-09-24 NOTE — Progress Notes (Signed)
Needs help filling out his living will, he and his wife do not understand it.

## 2021-09-25 ENCOUNTER — Inpatient Hospital Stay: Payer: Medicare PPO

## 2021-09-25 ENCOUNTER — Inpatient Hospital Stay: Payer: Medicare PPO | Admitting: Oncology

## 2021-10-02 ENCOUNTER — Inpatient Hospital Stay: Payer: Medicare PPO

## 2021-10-02 ENCOUNTER — Inpatient Hospital Stay (HOSPITAL_BASED_OUTPATIENT_CLINIC_OR_DEPARTMENT_OTHER): Payer: Medicare PPO | Admitting: Nurse Practitioner

## 2021-10-02 ENCOUNTER — Other Ambulatory Visit: Payer: Self-pay

## 2021-10-02 VITALS — BP 114/69 | HR 97 | Temp 97.6°F | Resp 16 | Wt 193.7 lb

## 2021-10-02 VITALS — BP 118/91 | HR 99 | Resp 16

## 2021-10-02 DIAGNOSIS — T451X5A Adverse effect of antineoplastic and immunosuppressive drugs, initial encounter: Secondary | ICD-10-CM

## 2021-10-02 DIAGNOSIS — C3411 Malignant neoplasm of upper lobe, right bronchus or lung: Secondary | ICD-10-CM

## 2021-10-02 DIAGNOSIS — C3490 Malignant neoplasm of unspecified part of unspecified bronchus or lung: Secondary | ICD-10-CM

## 2021-10-02 DIAGNOSIS — D6481 Anemia due to antineoplastic chemotherapy: Secondary | ICD-10-CM | POA: Diagnosis not present

## 2021-10-02 DIAGNOSIS — Z5112 Encounter for antineoplastic immunotherapy: Secondary | ICD-10-CM | POA: Diagnosis not present

## 2021-10-02 LAB — CBC WITH DIFFERENTIAL/PLATELET
Abs Immature Granulocytes: 0.05 10*3/uL (ref 0.00–0.07)
Basophils Absolute: 0.1 10*3/uL (ref 0.0–0.1)
Basophils Relative: 1 %
Eosinophils Absolute: 0.6 10*3/uL — ABNORMAL HIGH (ref 0.0–0.5)
Eosinophils Relative: 6 %
HCT: 25.8 % — ABNORMAL LOW (ref 39.0–52.0)
Hemoglobin: 7.9 g/dL — ABNORMAL LOW (ref 13.0–17.0)
Immature Granulocytes: 1 %
Lymphocytes Relative: 8 %
Lymphs Abs: 0.8 10*3/uL (ref 0.7–4.0)
MCH: 29.5 pg (ref 26.0–34.0)
MCHC: 30.6 g/dL (ref 30.0–36.0)
MCV: 96.3 fL (ref 80.0–100.0)
Monocytes Absolute: 0.7 10*3/uL (ref 0.1–1.0)
Monocytes Relative: 7 %
Neutro Abs: 7.3 10*3/uL (ref 1.7–7.7)
Neutrophils Relative %: 77 %
Platelets: 246 10*3/uL (ref 150–400)
RBC: 2.68 MIL/uL — ABNORMAL LOW (ref 4.22–5.81)
RDW: 16 % — ABNORMAL HIGH (ref 11.5–15.5)
WBC: 9.4 10*3/uL (ref 4.0–10.5)
nRBC: 0 % (ref 0.0–0.2)

## 2021-10-02 LAB — COMPREHENSIVE METABOLIC PANEL
ALT: 23 U/L (ref 0–44)
AST: 22 U/L (ref 15–41)
Albumin: 2.7 g/dL — ABNORMAL LOW (ref 3.5–5.0)
Alkaline Phosphatase: 98 U/L (ref 38–126)
Anion gap: 10 (ref 5–15)
BUN: 10 mg/dL (ref 8–23)
CO2: 26 mmol/L (ref 22–32)
Calcium: 8.6 mg/dL — ABNORMAL LOW (ref 8.9–10.3)
Chloride: 100 mmol/L (ref 98–111)
Creatinine, Ser: 0.88 mg/dL (ref 0.61–1.24)
GFR, Estimated: >60 mL/min (ref >60)
Glucose, Bld: 263 mg/dL — ABNORMAL HIGH (ref 70–99)
Potassium: 3.5 mmol/L (ref 3.5–5.1)
Sodium: 136 mmol/L (ref 135–145)
Total Bilirubin: 0.5 mg/dL (ref 0.3–1.2)
Total Protein: 7 g/dL (ref 6.5–8.1)

## 2021-10-02 MED ORDER — SODIUM CHLORIDE 0.9 % IV SOLN
10.0000 mg/kg | Freq: Once | INTRAVENOUS | Status: AC
  Administered 2021-10-02: 12:00:00 860 mg via INTRAVENOUS
  Filled 2021-10-02: qty 10

## 2021-10-02 MED ORDER — SODIUM CHLORIDE 0.9 % IV SOLN
Freq: Once | INTRAVENOUS | Status: AC
  Filled 2021-10-02: qty 250

## 2021-10-02 MED ORDER — HEPARIN SOD (PORK) LOCK FLUSH 100 UNIT/ML IV SOLN
500.0000 [IU] | Freq: Once | INTRAVENOUS | Status: AC | PRN
  Administered 2021-10-02: 14:00:00 500 [IU]
  Filled 2021-10-02: qty 5

## 2021-10-02 NOTE — Progress Notes (Signed)
Ok per NP to treat with hgb 7.9

## 2021-10-02 NOTE — Patient Instructions (Signed)
[facility]  Discharge Instructions: ?Thank you for choosing [facility] to provide your oncology and hematology care.  ?If you have a lab appointment with the Cancer Center, please go directly to the Cancer Center and check in at the registration area. ? ?Wear comfortable clothing and clothing appropriate for easy access to any Portacath or PICC line.  ? ?We strive to give you quality time with your provider. You may need to reschedule your appointment if you arrive late (15 or more minutes).  Arriving late affects you and other patients whose appointments are after yours.  Also, if you miss three or more appointments without notifying the office, you may be dismissed from the clinic at the provider?s discretion.    ?  ?For prescription refill requests, have your pharmacy contact our office and allow 72 hours for refills to be completed.   ? ?  ?To help prevent nausea and vomiting after your treatment, we encourage you to take your nausea medication as directed. ? ?BELOW ARE SYMPTOMS THAT SHOULD BE REPORTED IMMEDIATELY: ?*FEVER GREATER THAN 100.4 F (38 ?C) OR HIGHER ?*CHILLS OR SWEATING ?*NAUSEA AND VOMITING THAT IS NOT CONTROLLED WITH YOUR NAUSEA MEDICATION ?*UNUSUAL SHORTNESS OF BREATH ?*UNUSUAL BRUISING OR BLEEDING ?*URINARY PROBLEMS (pain or burning when urinating, or frequent urination) ?*BOWEL PROBLEMS (unusual diarrhea, constipation, pain near the anus) ?TENDERNESS IN MOUTH AND THROAT WITH OR WITHOUT PRESENCE OF ULCERS (sore throat, sores in mouth, or a toothache) ?UNUSUAL RASH, SWELLING OR PAIN  ?UNUSUAL VAGINAL DISCHARGE OR ITCHING  ? ?Items with * indicate a potential emergency and should be followed up as soon as possible or go to the Emergency Department if any problems should occur. ? ?Please show the CHEMOTHERAPY ALERT CARD or IMMUNOTHERAPY ALERT CARD at check-in to the Emergency Department and triage nurse. ? ?Should you have questions after your visit  or need to cancel or reschedule your appointment, please contact [facility]  [phone] and follow the prompts.  Office hours are 8:00 a.m. to 4:30 p.m. Monday - Friday. Please note that voicemails left after 4:00 p.m. may not be returned until the following business day.  We are closed weekends and major holidays. You have access to a nurse at all times for urgent questions. Please call the main number to the clinic [phone] and follow the prompts. ? ?For any non-urgent questions, you may also contact your provider using MyChart. We now offer e-Visits for anyone 18 and older to request care online for non-urgent symptoms. For details visit [email or web address]. ?  ?Also download the MyChart app! Go to the app store, search "MyChart", open the app, select [facility], and log in with your MyChart username and password. ? ?Due to Covid, a mask is required upon entering the hospital/clinic. If you do not have a mask, one will be given to you upon arrival. For doctor visits, patients may have 1 support person aged 18 or older with them. For treatment visits, patients cannot have anyone with them due to current Covid guidelines and our immunocompromised population.  ?

## 2021-10-02 NOTE — Progress Notes (Signed)
[facility] NOTE  Patient Care Team: [name], MD as PCP - General (Family Medicine) [name], RN as Oncology Nurse Navigator [name], MD as Consulting Physician (Urology) [name], MD as Consulting Physician (Oncology)  CHIEF COMPLAINTS/PURPOSE OF CONSULTATION: lung cancer  Oncology History Overview Note  AUG 2022- 8.2 x 5.9 cm spiculated mass noted in right upper lobe consistent with malignancy. It extends from the right hilum to the lateral chest wall and results in lytic destruction of the lateral portion of the right fourth rib.  Mediastinal lymph nodes are noted, including 12 mm right hilar lymph node, consistent with metastatic disease. 3 cm right adrenal mass is noted concerning for metastatic disease.  # AUG 2022- PET IMPRESSION: Peripherally hypermetabolic right upper lobe mass, likely due to central necrosis. Mass extends into the right hilum and right lateral chest wall involving the second through fourth right ribs.   Mildly enlarged and hypermetabolic right hilar lymph node.   No evidence of metastatic disease in the abdomen or pelvis.  # AUG, 2022-RIGHT CHEST WALL Bx-necrosis; suspicious for malignancy not definitive [discussed with Dr.Kraynie;MDT] LUNG MASS, RIGHT; BIOPSY:  - SUSPICIOUS FOR MALIGNANCY.  - SEE COMMENT.   Comment:  Approximately six tissue cores demonstrate inflamed fibrous capsule with  hemosiderin-laden macrophages, in a background of abundant necrosis.  One of the cores demonstrates a minute fragment of viable epithelial  cells.  These cells are positive for p40.  They are negative for TTF1  and CK7. The cells of interest disappear on deeper cut levels.  While  the findings are suspicious for squamous cell carcinoma, there is not  enough viable tumor for a definitive diagnosis.  Additional tissue  sampling may be helpful.   # SEP 2022-cycle #2 Taxol hypersensitive reaction.  Switched  over to #3 cycle- carbo-Abraxane starting 06/29/2021   Primary cancer of right upper lobe of lung ([place])  06/03/2021 Initial Diagnosis   Primary cancer of right upper lobe of lung ([place])   06/03/2021 Cancer Staging   Staging form: Lung, AJCC 8th Edition - Clinical: Stage IIIB (cT3, cN2, cM0) - Signed by [name], MD on 06/03/2021   06/15/2021 - 08/03/2021 Chemotherapy   Patient is on Treatment Plan : LUNG Carboplatin / Paclitaxel + XRT q7d     09/25/2021 -  Chemotherapy   Patient is on Treatment Plan : LUNG Durvalumab q14d      HISTORY OF PRESENTING ILLNESS: Ambulating independently.  Accompanied by wife.  Rodney Vega 65 y.o.  male history of smoking -"lung cancer" [Limited necrotic tissue]-stage III unresectable currently on chemoradiation- carbo-abraxane is here for evaluation and consideration of imfinzi. He is s/p augmentin for possible pneumonia. Repeat chest xray showed resolution of pneumonia and stable disease. He feels well. Cough has improved. Chest wall pain on right side is stable and not bothersome. No nausea, vomiting, headaches.    Review of Systems  Constitutional:  Negative for chills, fever, malaise/fatigue and weight loss.  HENT:  Negative for hearing loss, nosebleeds, sore throat and tinnitus.   Eyes:  Negative for blurred vision and double vision.  Respiratory:  Negative for cough, hemoptysis, shortness of breath and wheezing.   Cardiovascular:  Positive for chest pain (per hpi). Negative for palpitations and leg swelling.  Gastrointestinal:  Negative for abdominal pain, blood in stool, constipation, diarrhea, melena, nausea and vomiting.  Genitourinary:  Negative for dysuria and urgency.  Musculoskeletal:  Negative for back pain, falls, joint pain and myalgias.  Skin:  Negative for itching and rash.  Neurological:  Negative for dizziness, tingling, sensory change, loss of consciousness, weakness and headaches.  Endo/Heme/Allergies:  Negative for  environmental allergies. Does not bruise/bleed easily.  Psychiatric/Behavioral:  Negative for depression. The patient is not nervous/anxious and does not have insomnia.     MEDICAL HISTORY:  Past Medical History:  Diagnosis Date   Cancer [facility])    Degenerative arthritis of knee    Depression    Diverticulitis    Glaucoma    since 2004   Hernia 1979   History of kidney stones    Hypertension    Personal history of tobacco use, presenting hazards to health    Pre-diabetes    Screening for obesity    Special screening for malignant neoplasms, colon    Wears dentures    full upper and lower    SURGICAL HISTORY: Past Surgical History:  Procedure Laterality Date   [place], 2012   lumbar bulging disc   CATARACT EXTRACTION W/PHACO Left 01/20/2021   Procedure: CATARACT EXTRACTION PHACO AND INTRAOCULAR LENS PLACEMENT ([place]) LEFT;  Surgeon: Birder Robson, MD;  Location: [facility];  Service: Ophthalmology;  Laterality: Left;  10.13 0:52.1   COLONOSCOPY  6568,1275   [facility]/Dr. Buelah Manis sessile polyp,41mm, found in rectum,multiple diverticula were found in the sigmoid, in descending and in transverse colon   COLOSTOMY  04-20-13   HERNIA REPAIR  1979   inguinal   IR IMAGING GUIDED PORT INSERTION  06/10/2021   JOINT REPLACEMENT Right    knee   KNEE ARTHROPLASTY Right 05/08/2018   Procedure: COMPUTER ASSISTED TOTAL KNEE ARTHROPLASTY;  Surgeon: Dereck Leep, MD;  Location: [facility];  Service: Orthopedics;  Laterality: Right;   KNEE ARTHROPLASTY Left 11/16/2019   Procedure: COMPUTER ASSISTED TOTAL KNEE ARTHROPLASTY;  Surgeon: Dereck Leep, MD;  Location: [facility];  Service: Orthopedics;  Laterality: Left;   LAPAROTOMY  04-20-13   colon resection with colosotmy   LITHOTRIPSY  2013   TONSILLECTOMY AND ADENOIDECTOMY  1962    SOCIAL HISTORY: Social History   Socioeconomic History   Marital status: Married    Spouse name: Not on file   Number of children: Not on file    Years of education: Not on file   Highest education level: Not on file  Occupational History   Not on file  Tobacco Use   Smoking status: Every Day    Packs/day: 1.00    Years: 20.00    Pack years: 20.00    Types: Cigarettes   Smokeless tobacco: Never   Tobacco comments:    since age 32 or 58  Vaping Use   Vaping Use: Never used  Substance and Sexual Activity   Alcohol use: Yes    Comment: occassional   Drug use: Yes    Types: Marijuana   Sexual activity: Not on file  Other Topics Concern   Not on file  Social History Narrative   15 mins [place]; smoking; not much alcohol. Worked for [employer], 2019. Marland Kitchen    Social Determinants of Health   Financial Resource Strain: Not on file  Food Insecurity: Not on file  Transportation Needs: Not on file  Physical Activity: Not on file  Stress: Not on file  Social Connections: Not on file  Intimate Partner Violence: Not on file    FAMILY HISTORY: Family History  Problem Relation Age of Onset   Diabetes Mother    Heart disease Mother  Heart attack Father    Prostate cancer Father     ALLERGIES:  is allergic to paclitaxel, ambien [zolpidem], and aleve [naproxen sodium].  MEDICATIONS:  Current Outpatient Medications  Medication Sig Dispense Refill   acetaminophen (TYLENOL) 500 MG tablet Take 1,000 mg by mouth daily.     albuterol (VENTOLIN HFA) 108 (90 Base) MCG/ACT inhaler Inhale 2 puffs into the lungs every 6 (six) hours as needed for wheezing or shortness of breath. 1 each 2   blood glucose meter kit and supplies KIT Check your blood glucose levels twice a day; once in the morning before breakfast; and once in the evening after dinner. 1 each 0   cetirizine (ZYRTEC) 10 MG tablet Take 10 mg by mouth daily.     Cyanocobalamin (VITAMIN B-12 PO) Take by mouth daily.     diphenhydramine-acetaminophen (TYLENOL PM) 25-500 MG TABS Take 1-2 tablets by mouth at bedtime as needed (sleep.).       dorzolamide-timolol (COSOPT) 22.3-6.8 MG/ML ophthalmic solution Place 1 drop into both eyes 2 (two) times daily.     fluticasone-salmeterol (ADVAIR DISKUS) 500-50 MCG/ACT AEPB Inhale 1 puff into the lungs in the morning and at bedtime. 1 each 3   glipiZIDE (GLUCOTROL) 5 MG tablet Take 1 tablet (5 mg total) by mouth 2 (two) times daily before a meal. 60 tablet 3   glucose blood test strip Check your blood glucose levels twice a day; once in the morning before breakfast; and once in the evening after dinner. 100 each 12   Lancets (FREESTYLE) lancets Check your blood glucose levels twice a day; once in the morning before breakfast; and once in the evening after dinner. 100 each 12   latanoprost (XALATAN) 0.005 % ophthalmic solution Place 1 drop into both eyes at bedtime.     montelukast (SINGULAIR) 10 MG tablet TAKE 1 TABLET(10 MG) BY MOUTH AT BEDTIME 90 tablet 0   olmesartan (BENICAR) 40 MG tablet Take 40 mg by mouth daily.     pregabalin (LYRICA) 50 MG capsule Take 1 capsule (50 mg total) by mouth 2 (two) times daily. 60 capsule 1   traZODone (DESYREL) 50 MG tablet TAKE 1 TABLET(50 MG) BY MOUTH AT BEDTIME AS NEEDED FOR SLEEP 30 tablet 1   venlafaxine XR (EFFEXOR-XR) 75 MG 24 hr capsule Take 225 mg by mouth daily.     nicotine (NICODERM CQ - DOSED IN MG/24 HOURS) 21 mg/24hr patch Place 1 patch (21 mg total) onto the skin daily. (Patient not taking: Reported on 09/24/2021) 28 patch 1   sucralfate (CARAFATE) 1 g tablet Take 1 tablet (1 g total) by mouth 3 (three) times daily. Dissolve in 3-4 tbsp warm water, swish and swallow (Patient not taking: Reported on 09/24/2021) 90 tablet 1   traMADol (ULTRAM) 50 MG tablet TAKE 1 TABLET(50 MG) BY MOUTH EVERY 8 HOURS AS NEEDED (Patient not taking: Reported on 09/24/2021) 60 tablet 0   No current facility-administered medications for this visit.   Facility-Administered Medications Ordered in Other Visits  Medication Dose Route Frequency Provider Last Rate Last  Admin   heparin lock flush 100 UNIT/ML injection            heparin lock flush 100 UNIT/ML injection               .  PHYSICAL EXAMINATION: ECOG PERFORMANCE STATUS: 1 - Symptomatic but completely ambulatory  Vitals:   10/02/21 1012  BP: 114/69  Pulse: 97  Resp: 16  Temp: 97.6 F (36.4  C)  SpO2: 99%    Filed Weights   10/02/21 1012  Weight: 193 lb 11.2 oz (87.9 kg)     Physical Exam Vitals and nursing note reviewed.  HENT:     Head: Normocephalic and atraumatic.     Mouth/Throat:     Pharynx: Oropharynx is clear.  Eyes:     Extraocular Movements: Extraocular movements intact.     Pupils: Pupils are equal, round, and reactive to light.  Cardiovascular:     Rate and Rhythm: Normal rate and regular rhythm.  Pulmonary:     Comments: Decreased breath sounds bilaterally.  Abdominal:     Palpations: Abdomen is soft.  Musculoskeletal:        General: Normal range of motion.     Cervical back: Normal range of motion.  Skin:    General: Skin is warm.  Neurological:     General: No focal deficit present.     Mental Status: He is alert and oriented to person, place, and time.  Psychiatric:        Behavior: Behavior normal.        Judgment: Judgment normal.     LABORATORY DATA:  I have reviewed the data as listed Lab Results  Component Value Date   WBC 9.4 10/02/2021   HGB 7.9 (L) 10/02/2021   HCT 25.8 (L) 10/02/2021   MCV 96.3 10/02/2021   PLT 246 10/02/2021   Recent Labs    09/02/21 1345 09/11/21 0947 10/02/21 1000  NA 136 135 136  K 3.3* 3.3* 3.5  CL 104 99 100  CO2 $Re'27 25 26  'wlO$ GLUCOSE 193* 259* 263*  BUN $Re'10 10 10  'pEE$ CREATININE 0.80 0.84 0.88  CALCIUM 8.5* 8.5* 8.6*  GFRNONAA >60 >60 >60  PROT 6.7 6.9 7.0  ALBUMIN 2.5* 2.6* 2.7*  AST $Re'22 21 22  'Ato$ ALT $R'31 19 23  'Ns$ ALKPHOS 95 85 98  BILITOT 0.5 0.5 0.5     RADIOGRAPHIC STUDIES: I have personally reviewed the radiological images as listed and agreed with the findings in the report. DG Chest 2  View  Result Date: 09/18/2021 CLINICAL DATA:  Cough and pneumonia EXAM: CHEST - 2 VIEW COMPARISON:  Chest CT 09/09/2021 FINDINGS: Unchanged opacity with central lucency at the right apex where there is volume loss and chronic rib deformity. Left IJ central line with tip at the upper cavoatrial junction. Clear left lung. Normal heart size. IMPRESSION: Stable tumor associated opacity at the right apex.  No acute finding Electronically Signed   By: Jorje Guild M.D.   On: 09/18/2021 11:51   DG Chest 2 View  Result Date: 09/03/2021 CLINICAL DATA:  Right lung cancer.  Fever. EXAM: CHEST - 2 VIEW COMPARISON:  May 29, 2021. FINDINGS: The heart size and mediastinal contours are within normal limits. Left lung is clear. Interval placement of left internal jugular Port-A-Cath with distal tip in expected position of cavoatrial junction. No pneumothorax is noted. Increased right upper lobe airspace opacity is noted consistent with right lung mass which may be increasing in size with associated postobstructive atelectasis or infiltrate. Lytic destruction of right fourth rib is noted. IMPRESSION: Increased opacity is noted in right upper lobe concerning for enlarging mass or malignancy and associated postobstructive atelectasis or pneumonia. Lytic destruction of right fourth rib is noted secondary to malignancy. Electronically Signed   By: Marijo Conception M.D.   On: 09/03/2021 16:58   CT CHEST W CONTRAST  Result Date: 09/10/2021 CLINICAL DATA:  Metastatic non-small  cell lung cancer, assess treatment response EXAM: CT CHEST WITH CONTRAST TECHNIQUE: Multidetector CT imaging of the chest was performed during intravenous contrast administration. CONTRAST:  15mL OMNIPAQUE IOHEXOL 300 MG/ML  SOLN COMPARISON:  CT chest, 05/19/2021, PET-CT, 05/25/2021 FINDINGS: Cardiovascular: Left chest port catheter. Aortic atherosclerosis. Normal heart size. Left and right coronary artery calcifications. No pericardial effusion.  Mediastinum/Nodes: Interval enlargement of a subcarinal lymph node measuring 2.0 x 1.1 cm, previously 1.5 x 0.6 cm (series 2, image 77). Prominent pretracheal lymph nodes are unchanged measuring up to 1.1 x 0.9 cm (series 2, image 64). Thyroid gland, trachea, and esophagus demonstrate no significant findings. Lungs/Pleura: There has been an apparent interval increase in size of a large necrotic mass of the peripheral right upper lobe, now measuring approximately 10.4 x 6.7 cm, previously 8.2 x 5.9 cm when measured similarly (series 2, image 56). There are internal air loculations. There is new irregular ground-glass airspace opacity and consolidation of the medial right upper lobe (series 3, image 48) and superior segment left lower lobe (series 3, image 66). Additional new small nodules throughout the dependent right lower lobe (series 3, image 98). Mild centrilobular and paraseptal emphysema. Background of very fine centrilobular pulmonary nodules, most concentrated in the lung apices. No pleural effusion or pneumothorax. Upper Abdomen: No acute abnormality. Stable, benign right adrenal adenoma. Musculoskeletal: Redemonstrated bony destruction of the lateral right third and fourth ribs underlying mass. IMPRESSION: 1. There has been an apparent interval increase in size of a large necrotic mass of the peripheral right upper lobe, now measuring approximately 10.4 x 6.7 cm, previously 8.2 x 5.9 cm when measured similarly. Enlargement the overall lesion is generally concerning for worsened malignancy, however the volume of tissue is likely reduced, given substantial volume of internal necrotic fluid. This could be assessed for residual metabolic activity by PET-CT if desired. 2. Interval enlargement of a subcarinal lymph node. Prominent pretracheal lymph nodes are unchanged. This is worrisome for worsened nodal metastatic disease, although potentially reactive given the presence of infection or inflammation in the  right lung described below. As above, this could be characterized for residual metabolic activity by PET-CT if desired. 3. New irregular ground-glass airspace opacity and consolidation of the medial right upper lobe and superior segment left lower lobe. Additional new small nodules throughout the dependent right lower lobe. Findings are consistent with multifocal infection. 4. Redemonstrated bony destruction of the lateral right third and fourth ribs underlying mass. 5. Emphysema. 6. Coronary artery disease. Aortic Atherosclerosis (ICD10-I70.0) and Emphysema (ICD10-J43.9). Electronically Signed   By: Delanna Ahmadi M.D.   On: 09/10/2021 13:03    ASSESSMENT & PLAN:   No problem-specific Assessment & Plan notes found for this encounter.  RRight upper lobe lung mass -concerning for malignancy [biopsy inconclusive/atypical cells]- Stage III-s/p carbo Abraxane with radiation [finished end of Oct 2022]. DEC 7th CT-increase in size of the Right upper lobe lung mass ~10.5 x 6.5 cm [previously around 8 x 6 cm]-along with slight increase of the mediastinal adenopathy; and surrounding infiltrative changes-pneumonitis infectious versus inflammation [see below]. Possible progressive disease vs pneumonia vs chemoradiation changes. Repeat chest xray showed resolution of pneumonia and essentially stable disease. Clinically he is improved and cough has improved.   Proceed with immunotherapy- durvalumab today. Labs reviewed- anemia is stable to slightly improved.   Fever & cough- s/p doxycycline and augmentin. S/p evaluation with Dr. Delaine Lame. She recommended checking blood cultures if documented fever during office visit in the future. Possibility that fever is  secondary to tumor though post obstructive pneumonia is less likely. Antibiotics not recommended unless clear evidence of postobstructive pneumonia.   COPD- improved post augmentin. Xray reviewed from 09/18/21. Clinically improved. Consider referral to  pulmonology in future.    Right sided chest wall pain- stable. Known lytic lesion of rib. Stable and pain well controlled. Followed by palliative care.   Diabetes mellitus- on glipizide. Blood sugar 263 today. Monitor closely  in setting of imfinzi.   IV mediport: functioning/STABLE  Hematuria- planned ct abdomen pelvis scheduled for 10/09/21 per Dr. [name].    Disposition: Proceed with imfinzi today Rtc in 2 weeks for port/labs (cbc, cmp), see Dr. Rogue Bussing, and Imfinzi.    All questions were answered. The patient knows to call the clinic with any problems, questions or concerns.   Verlon Au, NP 10/02/2021

## 2021-10-04 HISTORY — PX: LUNG BIOPSY: SHX232

## 2021-10-05 ENCOUNTER — Encounter: Payer: Self-pay | Admitting: Internal Medicine

## 2021-10-05 NOTE — Progress Notes (Signed)
This encounter was created in error - please disregard.

## 2021-10-09 ENCOUNTER — Other Ambulatory Visit: Payer: Self-pay

## 2021-10-09 ENCOUNTER — Ambulatory Visit
Admission: RE | Admit: 2021-10-09 | Discharge: 2021-10-09 | Disposition: A | Discharge status: Home / Self Care | Payer: Medicare PPO | Source: Ambulatory Visit | Attending: Urology | Admitting: Urology

## 2021-10-09 DIAGNOSIS — N1330 Unspecified hydronephrosis: Secondary | ICD-10-CM | POA: Insufficient documentation

## 2021-10-09 DIAGNOSIS — R3121 Asymptomatic microscopic hematuria: Secondary | ICD-10-CM | POA: Diagnosis present

## 2021-10-09 MED ORDER — IOHEXOL 300 MG/ML  SOLN
100.0000 mL | Freq: Once | INTRAMUSCULAR | Status: AC | PRN
  Administered 2021-10-09: 100 mL via INTRAVENOUS

## 2021-10-12 ENCOUNTER — Encounter: Payer: Self-pay | Admitting: Internal Medicine

## 2021-10-12 ENCOUNTER — Inpatient Hospital Stay: Payer: Medicare PPO | Attending: Internal Medicine

## 2021-10-12 ENCOUNTER — Inpatient Hospital Stay (HOSPITAL_BASED_OUTPATIENT_CLINIC_OR_DEPARTMENT_OTHER): Payer: Medicare PPO | Admitting: Internal Medicine

## 2021-10-12 ENCOUNTER — Inpatient Hospital Stay: Payer: Medicare PPO

## 2021-10-12 ENCOUNTER — Other Ambulatory Visit: Payer: Self-pay

## 2021-10-12 DIAGNOSIS — I10 Essential (primary) hypertension: Secondary | ICD-10-CM | POA: Insufficient documentation

## 2021-10-12 DIAGNOSIS — N1330 Unspecified hydronephrosis: Secondary | ICD-10-CM | POA: Diagnosis not present

## 2021-10-12 DIAGNOSIS — D649 Anemia, unspecified: Secondary | ICD-10-CM | POA: Diagnosis not present

## 2021-10-12 DIAGNOSIS — Z5112 Encounter for antineoplastic immunotherapy: Secondary | ICD-10-CM | POA: Diagnosis not present

## 2021-10-12 DIAGNOSIS — J449 Chronic obstructive pulmonary disease, unspecified: Secondary | ICD-10-CM | POA: Insufficient documentation

## 2021-10-12 DIAGNOSIS — Z79899 Other long term (current) drug therapy: Secondary | ICD-10-CM | POA: Insufficient documentation

## 2021-10-12 DIAGNOSIS — R1909 Other intra-abdominal and pelvic swelling, mass and lump: Secondary | ICD-10-CM | POA: Insufficient documentation

## 2021-10-12 DIAGNOSIS — Z7984 Long term (current) use of oral hypoglycemic drugs: Secondary | ICD-10-CM | POA: Diagnosis not present

## 2021-10-12 DIAGNOSIS — C3411 Malignant neoplasm of upper lobe, right bronchus or lung: Secondary | ICD-10-CM | POA: Insufficient documentation

## 2021-10-12 DIAGNOSIS — C3490 Malignant neoplasm of unspecified part of unspecified bronchus or lung: Secondary | ICD-10-CM

## 2021-10-12 DIAGNOSIS — E119 Type 2 diabetes mellitus without complications: Secondary | ICD-10-CM | POA: Diagnosis not present

## 2021-10-12 DIAGNOSIS — R000 Tachycardia, unspecified: Secondary | ICD-10-CM | POA: Insufficient documentation

## 2021-10-12 DIAGNOSIS — F17210 Nicotine dependence, cigarettes, uncomplicated: Secondary | ICD-10-CM | POA: Diagnosis not present

## 2021-10-12 LAB — CBC WITH DIFFERENTIAL/PLATELET
Abs Immature Granulocytes: 0.06 10*3/uL (ref 0.00–0.07)
Basophils Absolute: 0.1 10*3/uL (ref 0.0–0.1)
Basophils Relative: 1 %
Eosinophils Absolute: 0.5 10*3/uL (ref 0.0–0.5)
Eosinophils Relative: 4 %
HCT: 26.4 % — ABNORMAL LOW (ref 39.0–52.0)
Hemoglobin: 8.2 g/dL — ABNORMAL LOW (ref 13.0–17.0)
Immature Granulocytes: 1 %
Lymphocytes Relative: 8 %
Lymphs Abs: 0.8 10*3/uL (ref 0.7–4.0)
MCH: 29.5 pg (ref 26.0–34.0)
MCHC: 31.1 g/dL (ref 30.0–36.0)
MCV: 95 fL (ref 80.0–100.0)
Monocytes Absolute: 0.6 10*3/uL (ref 0.1–1.0)
Monocytes Relative: 6 %
Neutro Abs: 8.4 10*3/uL — ABNORMAL HIGH (ref 1.7–7.7)
Neutrophils Relative %: 80 %
Platelets: 205 10*3/uL (ref 150–400)
RBC: 2.78 MIL/uL — ABNORMAL LOW (ref 4.22–5.81)
RDW: 15.6 % — ABNORMAL HIGH (ref 11.5–15.5)
WBC: 10.4 10*3/uL (ref 4.0–10.5)
nRBC: 0 % (ref 0.0–0.2)

## 2021-10-12 LAB — COMPREHENSIVE METABOLIC PANEL
ALT: 17 U/L (ref 0–44)
AST: 18 U/L (ref 15–41)
Albumin: 2.8 g/dL — ABNORMAL LOW (ref 3.5–5.0)
Alkaline Phosphatase: 94 U/L (ref 38–126)
Anion gap: 8 (ref 5–15)
BUN: 10 mg/dL (ref 8–23)
CO2: 24 mmol/L (ref 22–32)
Calcium: 8.3 mg/dL — ABNORMAL LOW (ref 8.9–10.3)
Chloride: 103 mmol/L (ref 98–111)
Creatinine, Ser: 0.88 mg/dL (ref 0.61–1.24)
GFR, Estimated: >60 mL/min (ref >60)
Glucose, Bld: 203 mg/dL — ABNORMAL HIGH (ref 70–99)
Potassium: 3.2 mmol/L — ABNORMAL LOW (ref 3.5–5.1)
Sodium: 135 mmol/L (ref 135–145)
Total Bilirubin: 0.4 mg/dL (ref 0.3–1.2)
Total Protein: 7.1 g/dL (ref 6.5–8.1)

## 2021-10-12 MED ORDER — HEPARIN SOD (PORK) LOCK FLUSH 100 UNIT/ML IV SOLN
500.0000 [IU] | Freq: Once | INTRAVENOUS | Status: AC
  Filled 2021-10-12: qty 5

## 2021-10-12 MED ORDER — GUAIFENESIN-CODEINE 100-10 MG/5ML PO SOLN
5.0000 mL | Freq: Every day | ORAL | 0 refills | Status: DC | PRN
Start: 2021-10-12 — End: 2021-11-02

## 2021-10-12 MED ORDER — HEPARIN SOD (PORK) LOCK FLUSH 100 UNIT/ML IV SOLN
INTRAVENOUS | Status: AC
  Administered 2021-10-12: 500 [IU] via INTRAVENOUS
  Filled 2021-10-12: qty 5

## 2021-10-12 NOTE — Progress Notes (Signed)
0953- Patient received Imfinzi treatment on 10/02/2021. MD, Dr. Rogue Bussing, notified and aware. Per MD order: Hold/do not give Imfinzi treatment today; MD is rescheduling Imfinzi treatment for Monday, 10/19/2021. Patient informed and verbalized understanding.

## 2021-10-12 NOTE — Progress Notes (Signed)
Patient reports he has noticed blood with blowing nose a couple times in the last few days.  Occasional pain on right side chest where he has received radiation.

## 2021-10-12 NOTE — Assessment & Plan Note (Addendum)
#  Right upper lobe lung mass -concerning for malignancy [biopsy inconclusive/atypical cells]- Stage III-s/p carbo Abraxane with radiation [finished end of Oct 2022]. DEC 7th CT-increase in size of the Right upper lobe lung mass ~10.5 x 6.5 cm [previously around 8 x 6 cm]-along with slight increase of the mediastinal adenopathy; and surrounding infiltrative changes-pneumonitis infectious versus inflammation [see below] - STABLE.   # Proceed with IMFINZI; Labs today reviewed;  acceptable for treatment today.   #Anemia-hemoglobin 8.3; likely secondary to chemotherapy; not very symptomatic.  Hold off any PRBC transfusion. ? IDA-given cecal mass.  Check iron studies/ferritin.  # Cecal mass ~ 3 cm [> 9-10 years]ago-recommend evaluation with [facility] ASAP; Hx of Diverticulitis-[s/p CVF-Dr.Sankar/Dr.Wolfe]. 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   # COPD/given the possibility of pneumonia/inflammatory changes noted on CT scan s/p eval with Dr.Ravishakar- monitor for now.  Prescribed codeine-guaifenesin as needed.  #Right chest wall pain-on Tylenol/tramadol [Qhs-2; 5-6 tramadol a day]-  STABLE.   #Diabetes-201- Post coffe this AM; recommend glipizide 5 mg twice daily;   # IV mediport: functioning/STABLE  *appt thru mychart  # DISPOSITION: # referral to [facility] re: cecal mass/need for colonoscopy ASAP # Imfinzi today # follow up in 2 weeks-MD; -- labs- cbc/cmp;Imfinz; - -Dr.B  # I reviewed the blood work- with the patient in detail; also reviewed the imaging independently [as summarized above]; and with the patient in detail.

## 2021-10-12 NOTE — Progress Notes (Signed)
[name] NOTE  Patient Care Team: Rodney Pink, MD as PCP - General (Family Medicine) Rodney Nab, RN as Oncology Nurse Navigator Rodney Cowper, MD as Consulting Physician (Urology) Rodney Sickle, MD as Consulting Physician (Oncology)  CHIEF COMPLAINTS/PURPOSE OF CONSULTATION: lung cancer    Oncology History Overview Note  AUG 2022- 8.2 x 5.9 cm spiculated mass noted in right upper lobe consistent with malignancy. It extends from the right hilum to the lateral chest wall and results in lytic destruction of the lateral portion of the right fourth rib.  Mediastinal lymph nodes are noted, including 12 mm right hilar lymph node, consistent with metastatic disease. 3 cm right adrenal mass is noted concerning for metastatic disease.  # AUG 2022- PET IMPRESSION: Peripherally hypermetabolic right upper lobe mass, likely due to central necrosis. Mass extends into the right hilum and right lateral chest wall involving the second through fourth right ribs.   Mildly enlarged and hypermetabolic right hilar lymph node.   No evidence of metastatic disease in the abdomen or pelvis.  # AUG, 2022-RIGHT CHEST WALL Bx-necrosis; suspicious for malignancy not definitive [discussed with Dr.Kraynie;MDT] LUNG MASS, RIGHT; BIOPSY:  - SUSPICIOUS FOR MALIGNANCY.  - SEE COMMENT.   Comment:  Approximately six tissue cores demonstrate inflamed fibrous capsule with  hemosiderin-laden macrophages, in a background of abundant necrosis.  One of the cores demonstrates a minute fragment of viable epithelial  cells.  These cells are positive for p40.  They are negative for TTF1  and CK7. The cells of interest disappear on deeper cut levels.  While  the findings are suspicious for squamous cell carcinoma, there is not  enough viable tumor for a definitive diagnosis.  Additional tissue  sampling may be helpful.   # SEP 2022-cycle #2 Taxol hypersensitive reaction.  Switched  over to #3 cycle- carbo-Abraxane starting 06/29/2021   Primary cancer of right upper lobe of lung ([place])  06/03/2021 Initial Diagnosis   Primary cancer of right upper lobe of lung ([place])   06/03/2021 Cancer Staging   Staging form: Lung, AJCC 8th Edition - Clinical: Stage IIIB (cT3, cN2, cM0) - Signed by Rodney Sickle, MD on 06/03/2021    06/15/2021 - 08/03/2021 Chemotherapy   Patient is on Treatment Plan : LUNG Carboplatin / Paclitaxel + XRT q7d     10/02/2021 -  Chemotherapy   Patient is on Treatment Plan : LUNG Durvalumab q14d        HISTORY OF PRESENTING ILLNESS: Ambulating independently.  Accompanied by wife.  Rodney Vega 66 y.o.  male history of smoking -"lung cancer" [Limited necrotic tissue]-stage III unresectable currently on adjuvant IMFINZI is here for follow-up/ review results of CT scan.   In the interim patient was evaluated by ID-for possible postobstructive pneumonia/abscess.  S/p Augmentin/doxycycline.  Does not recommend any further antibiotics at this time-unless is noted to have progressive fevers.  Also in the interim- patient was evaluated by symptom management/urology-re: hematuria s/p CT scan abdomen pelvis.  Mild UPJ obstruction; also cecal mass/causing appendiceal inflammation.   Overall coughing is improved/not resolved.  Using inhalers as needed.   Denies any worsening chest wall pain.  No nausea no vomiting.  No headaches.  Review of Systems  Constitutional:  Positive for malaise/fatigue. Negative for chills, diaphoresis, fever and weight loss.  HENT:  Negative for nosebleeds and sore throat.   Eyes:  Negative for double vision.  Respiratory:  Positive for shortness of breath. Negative for cough, hemoptysis, sputum production and  wheezing.   Cardiovascular:  Negative for chest pain, palpitations, orthopnea and leg swelling.  Gastrointestinal:  Positive for nausea. Negative for abdominal pain, blood in stool, constipation, diarrhea, heartburn, melena  and vomiting.  Genitourinary:  Negative for dysuria, frequency and urgency.  Musculoskeletal:  Negative for back pain and joint pain.  Skin: Negative.  Negative for itching and rash.  Neurological:  Positive for tingling. Negative for dizziness, focal weakness, weakness and headaches.  Endo/Heme/Allergies:  Does not bruise/bleed easily.  Psychiatric/Behavioral:  Negative for depression. The patient is not nervous/anxious and does not have insomnia.     MEDICAL HISTORY:  Past Medical History:  Diagnosis Date   Cancer [facility])    Degenerative arthritis of knee    Depression    Diverticulitis    Glaucoma    since 2004   Hernia 1979   History of kidney stones    Hypertension    Personal history of tobacco use, presenting hazards to health    Pre-diabetes    Screening for obesity    Special screening for malignant neoplasms, colon    Wears dentures    full upper and lower    SURGICAL HISTORY: Past Surgical History:  Procedure Laterality Date   Rodney Vega, 2012   lumbar bulging disc   CATARACT EXTRACTION W/PHACO Left 01/20/2021   Procedure: CATARACT EXTRACTION PHACO AND INTRAOCULAR LENS PLACEMENT ([place]) LEFT;  Surgeon: Rodney Robson, MD;  Location: [facility];  Service: Ophthalmology;  Laterality: Left;  10.13 0:52.1   COLONOSCOPY  [id number]   [facility]/Dr. Buelah Vega sessile polyp,49mm, found in rectum,multiple diverticula were found in the sigmoid, in descending and in transverse colon   COLOSTOMY  04-20-13   HERNIA REPAIR  1979   inguinal   IR IMAGING GUIDED PORT INSERTION  06/10/2021   JOINT REPLACEMENT Right    knee   KNEE ARTHROPLASTY Right 05/08/2018   Procedure: COMPUTER ASSISTED TOTAL KNEE ARTHROPLASTY;  Surgeon: Rodney Leep, MD;  Location: [facility];  Service: Orthopedics;  Laterality: Right;   KNEE ARTHROPLASTY Left 11/16/2019   Procedure: COMPUTER ASSISTED TOTAL KNEE ARTHROPLASTY;  Surgeon: Rodney Leep, MD;  Location: [facility];  Service: Orthopedics;   Laterality: Left;   LAPAROTOMY  04-20-13   colon resection with colosotmy   LITHOTRIPSY  2013   TONSILLECTOMY AND ADENOIDECTOMY  1962    SOCIAL HISTORY: Social History   Socioeconomic History   Marital status: Married    Spouse name: Not on file   Number of children: Not on file   Years of education: Not on file   Highest education level: Not on file  Occupational History   Not on file  Tobacco Use   Smoking status: Every Day    Packs/day: 1.00    Years: 20.00    Pack years: 20.00    Types: Cigarettes   Smokeless tobacco: Never   Tobacco comments:    since age 39 or 51  Vaping Use   Vaping Use: Never used  Substance and Sexual Activity   Alcohol use: Yes    Comment: occassional   Drug use: Yes    Types: Marijuana   Sexual activity: Not on file  Other Topics Concern   Not on file  Social History Narrative   15 mins [place]; smoking; not much alcohol. Worked for [employer], 2019. Mar[employer]    Social Determinants of Health   Financial Resource Strain: Not on file  Food Insecurity: Not on file  Transportation Needs: Not  on file  Physical Activity: Not on file  Stress: Not on file  Social Connections: Not on file  Intimate Partner Violence: Not on file    FAMILY HISTORY: Family History  Problem Relation Age of Onset   Diabetes Mother    Heart disease Mother    Heart attack Father    Prostate cancer Father     ALLERGIES:  is allergic to paclitaxel, ambien [zolpidem], and aleve [naproxen sodium].  MEDICATIONS:  Current Outpatient Medications  Medication Sig Dispense Refill   acetaminophen (TYLENOL) 500 MG tablet Take 1,000 mg by mouth daily.     albuterol (VENTOLIN HFA) 108 (90 Base) MCG/ACT inhaler Inhale 2 puffs into the lungs every 6 (six) hours as needed for wheezing or shortness of breath. 1 each 2   blood glucose meter kit and supplies KIT Check your blood glucose levels twice a day; once in the morning before breakfast; and once in the  evening after dinner. 1 each 0   cetirizine (ZYRTEC) 10 MG tablet Take 10 mg by mouth daily.     Cyanocobalamin (VITAMIN B-12 PO) Take by mouth daily.     diphenhydramine-acetaminophen (TYLENOL PM) 25-500 MG TABS Take 1-2 tablets by mouth at bedtime as needed (sleep.).      dorzolamide-timolol (COSOPT) 22.3-6.8 MG/ML ophthalmic solution Place 1 drop into both eyes 2 (two) times daily.     fluticasone-salmeterol (ADVAIR DISKUS) 500-50 MCG/ACT AEPB Inhale 1 puff into the lungs in the morning and at bedtime. 1 each 3   glipiZIDE (GLUCOTROL) 5 MG tablet Take 1 tablet (5 mg total) by mouth 2 (two) times daily before a meal. 60 tablet 3   glucose blood test strip Check your blood glucose levels twice a day; once in the morning before breakfast; and once in the evening after dinner. 100 each 12   guaiFENesin-codeine 100-10 MG/5ML syrup Take 5 mLs by mouth daily as needed for cough. 120 mL 0   Lancets (FREESTYLE) lancets Check your blood glucose levels twice a day; once in the morning before breakfast; and once in the evening after dinner. 100 each 12   latanoprost (XALATAN) 0.005 % ophthalmic solution Place 1 drop into both eyes at bedtime.     montelukast (SINGULAIR) 10 MG tablet TAKE 1 TABLET(10 MG) BY MOUTH AT BEDTIME 90 tablet 0   olmesartan (BENICAR) 40 MG tablet Take 40 mg by mouth daily.     pregabalin (LYRICA) 50 MG capsule Take 1 capsule (50 mg total) by mouth 2 (two) times daily. 60 capsule 1   tamsulosin (FLOMAX) 0.4 MG CAPS capsule Take 0.4 mg by mouth daily.     traZODone (DESYREL) 50 MG tablet TAKE 1 TABLET(50 MG) BY MOUTH AT BEDTIME AS NEEDED FOR SLEEP 30 tablet 1   venlafaxine XR (EFFEXOR-XR) 75 MG 24 hr capsule Take 225 mg by mouth daily.     nicotine (NICODERM CQ - DOSED IN MG/24 HOURS) 21 mg/24hr patch Place 1 patch (21 mg total) onto the skin daily. (Patient not taking: Reported on 09/24/2021) 28 patch 1   sucralfate (CARAFATE) 1 g tablet Take 1 tablet (1 g total) by mouth 3 (three)  times daily. Dissolve in 3-4 tbsp warm water, swish and swallow (Patient not taking: Reported on 09/24/2021) 90 tablet 1   traMADol (ULTRAM) 50 MG tablet TAKE 1 TABLET(50 MG) BY MOUTH EVERY 8 HOURS AS NEEDED (Patient not taking: Reported on 09/24/2021) 60 tablet 0   No current facility-administered medications for this visit.   Facility-Administered  Medications Ordered in Other Visits  Medication Dose Route Frequency Provider Last Rate Last Admin   heparin lock flush 100 UNIT/ML injection            heparin lock flush 100 UNIT/ML injection               .  PHYSICAL EXAMINATION: ECOG PERFORMANCE STATUS: 1 - Symptomatic but completely ambulatory  Vitals:   10/12/21 0858  BP: 115/68  Pulse: 99  Resp: 16  Temp: 98 F (36.7 C)    Filed Weights   10/12/21 0858  Weight: 193 lb (87.5 kg)     Physical Exam Vitals and nursing note reviewed.  HENT:     Head: Normocephalic and atraumatic.     Mouth/Throat:     Pharynx: Oropharynx is clear.  Eyes:     Extraocular Movements: Extraocular movements intact.     Pupils: Pupils are equal, round, and reactive to light.  Cardiovascular:     Rate and Rhythm: Normal rate and regular rhythm.  Pulmonary:     Comments: Decreased breath sounds bilaterally.  Abdominal:     Palpations: Abdomen is soft.  Musculoskeletal:        General: Normal range of motion.     Cervical back: Normal range of motion.  Skin:    General: Skin is warm.  Neurological:     General: No focal deficit present.     Mental Status: He is alert and oriented to person, place, and time.  Psychiatric:        Behavior: Behavior normal.        Judgment: Judgment normal.     LABORATORY DATA:  I have reviewed the data as listed Lab Results  Component Value Date   WBC 10.4 10/12/2021   HGB 8.2 (L) 10/12/2021   HCT 26.4 (L) 10/12/2021   MCV 95.0 10/12/2021   PLT 205 10/12/2021   Recent Labs    09/11/21 0947 10/02/21 1000 10/12/21 0845  NA 135 136 135  K  3.3* 3.5 3.2*  CL 99 100 103  CO2 $Re'25 26 24  'aqq$ GLUCOSE 259* 263* 203*  BUN $Re'10 10 10  'LFy$ CREATININE 0.84 0.88 0.88  CALCIUM 8.5* 8.6* 8.3*  GFRNONAA >60 >60 >60  PROT 6.9 7.0 7.1  ALBUMIN 2.6* 2.7* 2.8*  AST $Re'21 22 18  'uSh$ ALT $R'19 23 17  'iE$ ALKPHOS 85 98 94  BILITOT 0.5 0.5 0.4    RADIOGRAPHIC STUDIES: I have personally reviewed the radiological images as listed and agreed with the findings in the report. CT ABDOMEN PELVIS W WO CONTRAST  Result Date: 10/11/2021 CLINICAL DATA:  Hematuria with left hydronephrosis EXAM: CT ABDOMEN AND PELVIS WITHOUT AND WITH CONTRAST TECHNIQUE: Multidetector CT imaging of the abdomen and pelvis was performed following the standard protocol before and following the bolus administration of intravenous contrast. CONTRAST:  143mL OMNIPAQUE IOHEXOL 300 MG/ML  SOLN COMPARISON:  Chest CT 09/09/2021.  PET-CT 05/25/2021 FINDINGS: Lower chest: Tree-in-bud nodularity in the right middle and lower lobes is progressive since chest CT 09/09/2021. The large right upper lobe necrotic pulmonary mass lesion has not been included on today's study. Hepatobiliary: No suspicious focal abnormality within the liver parenchyma. There is no evidence for gallstones, gallbladder wall thickening, or pericholecystic fluid. No intrahepatic or extrahepatic biliary dilation. Pancreas: No focal mass lesion. No dilatation of the main duct. No intraparenchymal cyst. No peripancreatic edema. Spleen: No splenomegaly. No focal mass lesion. Adrenals/Urinary Tract: 2.7 cm right adrenal nodule is stable since CT scan  of 01/07/2012 consistent with benign etiology such as adenoma. Tiny 11 mm left adrenal nodule also stable since 2013 consistent with benign process. Precontrast imaging shows no stones in the right kidney or ureter. No secondary changes in the right kidney or ureter. Multiple layering tiny stones are seen in the left renal pelvis (image 39/2 on bone windows) with clustered tiny stones in lower pole calices of  the left kidney. No stones in the upper or interpolar left kidney. There is mild left hydronephrosis, similar to multiple prior studies including the 2013 exam. Abrupt transition from dilated renal pelvis to nondilated proximal left ureter suggests UPJ obstruction. Left ureter unremarkable. No bladder stones. Imaging after IV contrast administration shows no suspicious enhancing lesion in either kidney. Delayed post-contrast imaging shows no wall thickening or soft tissue filling defect in either intrarenal collecting system or renal pelvis. Neither ureter is well opacified on delayed imaging, but there is no focal hydroureter, evidence of ureteral wall thickening, or mass lesion evident along the course of either ureter. Delayed imaging of the bladder shows no focal wall thickening or mass lesion. Stomach/Bowel: Stomach is unremarkable. No gastric wall thickening. No evidence of outlet obstruction. Duodenum is normally positioned as is the ligament of Treitz. Small fat density lesion again noted transverse duodenum consistent with lipoma. No small bowel wall thickening. No small bowel dilatation. The terminal ileum is normal. 3.8 x 3.1 cm soft tissue mass is identified in the cecum, involving the appendiceal orifice. This was present on but much less apparent on noncontrast CT imaging performed as part of the PET-CT 05/25/2021 and showed low level hypermetabolism. The appendix is dilated up to about 12 mm diameter, likely secondary to obstruction from the soft tissue lesion. Left abdominal sigmoid end colostomy evident with Hartmann's pouch anatomy. Vascular/Lymphatic: There is mild atherosclerotic calcification of the abdominal aorta without aneurysm. There is no gastrohepatic or hepatoduodenal ligament lymphadenopathy. No retroperitoneal or mesenteric lymphadenopathy. No pelvic sidewall lymphadenopathy. Reproductive: The prostate gland and seminal vesicles are unremarkable. Other: No intraperitoneal free fluid.  Musculoskeletal: No worrisome lytic or sclerotic osseous abnormality. IMPRESSION: 1. 3.8 x 3.1 cm soft tissue mass in the cecum, involving the appendiceal orifice. This was present on but much less apparent on the noncontrast CT imaging performed as part of the PET-CT 05/25/2021 and showed low level hypermetabolism on the PET images. Lesion was not present on CT from 02/20/2017. Imaging features are highly concerning for neoplasm. Appendix is dilated without periappendiceal edema or inflammation, likely secondary to obstruction from the cecal lesion. 2. Mild left hydronephrosis with abrupt transition from dilated renal pelvis to nondilated proximal left ureter. Imaging features suggest UPJ obstruction. 3. Multiple layering tiny stones in the left renal pelvis with clustered tiny stones in lower pole calices of the left kidney. 4. Progression of tree-in-bud nodularity in the right middle and lower lobes consistent with atypical infection. The known large necrotic right upper lobe pulmonary mass lesion has not been included on today's study. 5. Stable bilateral adrenal nodules since 2013 consistent with benign etiology such as adenomas. 6. Aortic Atherosclerosis (ICD10-I70.0). These results will be called to the ordering clinician or representative by the Radiologist Assistant, and communication documented in the PACS or [employer]. Electronically Signed   By: Misty Stanley M.D.   On: 10/11/2021 11:05   DG Chest 2 View  Result Date: 09/18/2021 CLINICAL DATA:  Cough and pneumonia EXAM: CHEST - 2 VIEW COMPARISON:  Chest CT 09/09/2021 FINDINGS: Unchanged opacity with central lucency at  the right apex where there is volume loss and chronic rib deformity. Left IJ central line with tip at the upper cavoatrial junction. Clear left lung. Normal heart size. IMPRESSION: Stable tumor associated opacity at the right apex.  No acute finding Electronically Signed   By: Jorje Guild M.D.   On: 09/18/2021 11:51     ASSESSMENT & PLAN:   Primary cancer of right upper lobe of lung ([place]) #Right upper lobe lung mass -concerning for malignancy [biopsy inconclusive/atypical cells]- Stage III-s/p carbo Abraxane with radiation [finished end of Oct 2022]. DEC 7th CT-increase in size of the Right upper lobe lung mass ~10.5 x 6.5 cm [previously around 8 x 6 cm]-along with slight increase of the mediastinal adenopathy; and surrounding infiltrative changes-pneumonitis infectious versus inflammation [see below] - STABLE.   # Proceed with IMFINZI; Labs today reviewed;  acceptable for treatment today.   #Anemia-hemoglobin 8.3; likely secondary to chemotherapy; not very symptomatic.  Hold off any PRBC transfusion. ? IDA-given cecal mass.  Check iron studies/ferritin.  # Cecal mass ~ 3 cm [> 9-10 years]ago-recommend evaluation with [facility] ASAP; Hx of Diverticulitis-[s/p CVF-Dr.Sankar/Dr.Wolfe]. 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   # COPD/given the possibility of pneumonia/inflammatory changes noted on CT scan s/p eval with Dr.Ravishakar- monitor for now.  Prescribed codeine-guaifenesin as needed.  #Right chest wall pain-on Tylenol/tramadol [Qhs-2; 5-6 tramadol a day]-  STABLE.   #Diabetes-201- Post coffe this AM; recommend glipizide 5 mg twice daily;   # IV mediport: functioning/STABLE  *appt thru mychart  # DISPOSITION: # referral to [facility] re: cecal mass/need for colonoscopy ASAP # Imfinzi today # follow up in 2 weeks-MD; -- labs- cbc/cmp;Imfinz; - -Dr.B  # I reviewed the blood work- with the patient in detail; also reviewed the imaging independently [as summarized above]; and with the patient in detail.    All questions were answered. The patient knows to call the clinic with any problems, questions or concerns.    Rodney Sickle, MD 10/12/2021 9:43 AM

## 2021-10-13 ENCOUNTER — Inpatient Hospital Stay: Payer: Medicare PPO

## 2021-10-13 NOTE — Progress Notes (Signed)
Nutrition Follow-up:  Patient with lung cancer current on durvalumab.    Spoke with patient's wife by phone.  Wife reports that overall patient's appetite is better, except for yesterday.  Patient is eating good sources of protein.  Wife denies any nutrition impact symptoms at this time.     Medications: reviewed  Labs: reviewed  Anthropometrics:   Weight 193 lb on 1/9, stable  193 lb on 12/9 191 lb on 11/17 193 lb on 10/24 195 lb on 10/10 197 lb on 9/26   NUTRITION DIAGNOSIS: Inadequate oral intake stable   INTERVENTION:  Continue high calorie, high protein foods for weight maintenance    MONITORING, EVALUATION, GOAL: weight trends, intake   NEXT VISIT: Tuesday, Feb 21 phone visit  Rodney Vega B. Zenia Resides, [place], [place] Registered Dietitian [phone] (mobile)

## 2021-10-19 ENCOUNTER — Telehealth: Payer: Self-pay | Admitting: *Deleted

## 2021-10-19 ENCOUNTER — Other Ambulatory Visit: Payer: Self-pay

## 2021-10-19 ENCOUNTER — Inpatient Hospital Stay: Payer: Medicare PPO

## 2021-10-19 VITALS — BP 113/58 | HR 104 | Temp 97.0°F | Resp 16 | Wt 190.1 lb

## 2021-10-19 DIAGNOSIS — C3411 Malignant neoplasm of upper lobe, right bronchus or lung: Secondary | ICD-10-CM

## 2021-10-19 DIAGNOSIS — C3490 Malignant neoplasm of unspecified part of unspecified bronchus or lung: Secondary | ICD-10-CM

## 2021-10-19 DIAGNOSIS — Z5112 Encounter for antineoplastic immunotherapy: Secondary | ICD-10-CM | POA: Diagnosis not present

## 2021-10-19 LAB — CBC WITH DIFFERENTIAL/PLATELET
Abs Immature Granulocytes: 0.12 10*3/uL — ABNORMAL HIGH (ref 0.00–0.07)
Basophils Absolute: 0.1 10*3/uL (ref 0.0–0.1)
Basophils Relative: 0 %
Eosinophils Absolute: 0.2 10*3/uL (ref 0.0–0.5)
Eosinophils Relative: 1 %
HCT: 25.7 % — ABNORMAL LOW (ref 39.0–52.0)
Hemoglobin: 8 g/dL — ABNORMAL LOW (ref 13.0–17.0)
Immature Granulocytes: 1 %
Lymphocytes Relative: 4 %
Lymphs Abs: 0.7 10*3/uL (ref 0.7–4.0)
MCH: 29 pg (ref 26.0–34.0)
MCHC: 31.1 g/dL (ref 30.0–36.0)
MCV: 93.1 fL (ref 80.0–100.0)
Monocytes Absolute: 0.8 10*3/uL (ref 0.1–1.0)
Monocytes Relative: 5 %
Neutro Abs: 13.8 10*3/uL — ABNORMAL HIGH (ref 1.7–7.7)
Neutrophils Relative %: 89 %
Platelets: 240 10*3/uL (ref 150–400)
RBC: 2.76 MIL/uL — ABNORMAL LOW (ref 4.22–5.81)
RDW: 15.7 % — ABNORMAL HIGH (ref 11.5–15.5)
WBC: 15.6 10*3/uL — ABNORMAL HIGH (ref 4.0–10.5)
nRBC: 0 % (ref 0.0–0.2)

## 2021-10-19 LAB — COMPREHENSIVE METABOLIC PANEL
ALT: 21 U/L (ref 0–44)
AST: 24 U/L (ref 15–41)
Albumin: 2.6 g/dL — ABNORMAL LOW (ref 3.5–5.0)
Alkaline Phosphatase: 111 U/L (ref 38–126)
Anion gap: 12 (ref 5–15)
BUN: 14 mg/dL (ref 8–23)
CO2: 23 mmol/L (ref 22–32)
Calcium: 8.4 mg/dL — ABNORMAL LOW (ref 8.9–10.3)
Chloride: 97 mmol/L — ABNORMAL LOW (ref 98–111)
Creatinine, Ser: 0.91 mg/dL (ref 0.61–1.24)
GFR, Estimated: >60 mL/min (ref >60)
Glucose, Bld: 290 mg/dL — ABNORMAL HIGH (ref 70–99)
Potassium: 3.5 mmol/L (ref 3.5–5.1)
Sodium: 132 mmol/L — ABNORMAL LOW (ref 135–145)
Total Bilirubin: 0.5 mg/dL (ref 0.3–1.2)
Total Protein: 7.4 g/dL (ref 6.5–8.1)

## 2021-10-19 MED ORDER — HEPARIN SOD (PORK) LOCK FLUSH 100 UNIT/ML IV SOLN
INTRAVENOUS | Status: AC
  Filled 2021-10-19: qty 5

## 2021-10-19 MED ORDER — SODIUM CHLORIDE 0.9 % IV SOLN
Freq: Once | INTRAVENOUS | Status: AC
  Filled 2021-10-19: qty 250

## 2021-10-19 MED ORDER — HEPARIN SOD (PORK) LOCK FLUSH 100 UNIT/ML IV SOLN
500.0000 [IU] | Freq: Once | INTRAVENOUS | Status: AC | PRN
  Administered 2021-10-19: 500 [IU]
  Filled 2021-10-19: qty 5

## 2021-10-19 MED ORDER — SODIUM CHLORIDE 0.9 % IV SOLN
10.0000 mg/kg | Freq: Once | INTRAVENOUS | Status: AC
  Administered 2021-10-19: 860 mg via INTRAVENOUS
  Filled 2021-10-19: qty 10

## 2021-10-19 NOTE — Patient Instructions (Signed)
[facility] AT [place]  Discharge Instructions: Thank you for choosing [facility] to provide your oncology and hematology care.  If you have a lab appointment with the [facility], please go directly to the [facility] and check in at the registration area.  Wear comfortable clothing and clothing appropriate for easy access to any Portacath or PICC line.   We strive to give you quality time with your provider. You may need to reschedule your appointment if you arrive late (15 or more minutes).  Arriving late affects you and other patients whose appointments are after yours.  Also, if you miss three or more appointments without notifying the office, you may be dismissed from the clinic at the providers discretion.      For prescription refill requests, have your pharmacy contact our office and allow 72 hours for refills to be completed.    Today you received the following chemotherapy and/or immunotherapy agents Imfinzi      To help prevent nausea and vomiting after your treatment, we encourage you to take your nausea medication as directed.  BELOW ARE SYMPTOMS THAT SHOULD BE REPORTED IMMEDIATELY: *FEVER GREATER THAN 100.4 F (38 C) OR HIGHER *CHILLS OR SWEATING *NAUSEA AND VOMITING THAT IS NOT CONTROLLED WITH YOUR NAUSEA MEDICATION *UNUSUAL SHORTNESS OF BREATH *UNUSUAL BRUISING OR BLEEDING *URINARY PROBLEMS (pain or burning when urinating, or frequent urination) *BOWEL PROBLEMS (unusual diarrhea, constipation, pain near the anus) TENDERNESS IN MOUTH AND THROAT WITH OR WITHOUT PRESENCE OF ULCERS (sore throat, sores in mouth, or a toothache) UNUSUAL RASH, SWELLING OR PAIN  UNUSUAL VAGINAL DISCHARGE OR ITCHING   Items with * indicate a potential emergency and should be followed up as soon as possible or go to the Emergency Department if any problems should occur.  Please show the CHEMOTHERAPY ALERT CARD or IMMUNOTHERAPY ALERT CARD at check-in to the  Emergency Department and triage nurse.  Should you have questions after your visit or need to cancel or reschedule your appointment, please contact [facility] [place] AT [place]  [phone] and follow the prompts.  Office hours are 8:00 a.m. to 4:30 p.m. Monday - Friday. Please note that voicemails left after 4:00 p.m. may not be returned until the following business day.  We are closed weekends and major holidays. You have access to a nurse at all times for urgent questions. Please call the main number to the clinic [phone] and follow the prompts.  For any non-urgent questions, you may also contact your provider using MyChart. We now offer e-Visits for anyone 71 and older to request care online for non-urgent symptoms. For details visit [email or web address].   Also download the MyChart app! Go to the app store, search "MyChart", open the app, select [facility], and log in with your MyChart username and password.  Due to Covid, a mask is required upon entering the hospital/clinic. If you do not have a mask, one will be given to you upon arrival. For doctor visits, patients may have 1 support person aged 60 or older with them. For treatment visits, patients cannot have anyone with them due to current Covid guidelines and our immunocompromised population.

## 2021-10-19 NOTE — Telephone Encounter (Signed)
Wife called stating that she saw patient lab results and noticed his WBC is elevated and is asking if she needs to be concerned about it. He is not running fever but continues to have fatigue. Please advise            Component Ref Range & Units 09:02 (10/19/21) 7 d ago (10/12/21) 2 wk ago (10/02/21) 1 mo ago (09/11/21) 1 mo ago (09/02/21) 1 mo ago (08/26/21) 2 mo ago (08/17/21)  WBC 4.0 - 10.5 K/uL 15.6 High   10.4  9.4  7.8  8.5  11.7 High   9.0   RBC 4.22 - 5.81 MIL/uL 2.76 Low   2.78 Low   2.68 Low   2.41 Low   2.40 Low   2.76 Low   2.79 Low    Hemoglobin 13.0 - 17.0 g/dL 8.0 Low   8.2 Low   7.9 Low   7.3 Low   7.4 Low   8.6 Low   8.8 Low    HCT 39.0 - 52.0 % 25.7 Low   26.4 Low   25.8 Low   23.3 Low   23.1 Low   26.8 Low   26.6 Low    MCV 80.0 - 100.0 fL 93.1  95.0  96.3  96.7  96.3  97.1  95.3   MCH 26.0 - 34.0 pg 29.0  29.5  29.5  30.3  30.8  31.2  31.5   MCHC 30.0 - 36.0 g/dL 31.1  31.1  30.6  31.3  32.0  32.1  33.1   RDW 11.5 - 15.5 % 15.7 High   15.6 High   16.0 High   16.7 High   16.4 High   16.7 High   17.5 High    Platelets 150 - 400 K/uL 240  205  246  189  173  188  236   nRBC 0.0 - 0.2 % 0.0  0.0  0.0  0.0  0.0  0.0  0.0   Neutrophils Relative % % 89  80  77  79  75  81  77   Neutro Abs 1.7 - 7.7 K/uL 13.8 High   8.4 High   7.3  6.2  6.6  9.6 High   7.0   Lymphocytes Relative % 4  8  8  9  12  9  9    Lymphs Abs 0.7 - 4.0 K/uL 0.7  0.8  0.8  0.7  1.0  1.1  0.9   Monocytes Relative % 5  6  7  7  7  6  11    Monocytes Absolute 0.1 - 1.0 K/uL 0.8  0.6  0.7  0.6  0.6  0.7  1.0   Eosinophils Relative % 1  4  6  4  4  2  1    Eosinophils Absolute 0.0 - 0.5 K/uL 0.2  0.5  0.6 High   0.3  0.3  0.2  0.1   Basophils Relative % 0  1  1  0  1  1  1    Basophils Absolute 0.0 - 0.1 K/uL 0.1  0.1  0.1  0.0  0.0  0.1  0.1   Immature Granulocytes % 1  1  1  1  1  1  1    Abs Immature Granulocytes 0.00 - 0.07 K/uL 0.12 High   0.06 CM  0.05 CM  0.04 CM  0.04 CM  0.08 High  CM  0.07 CM   Comment:  Performed at [facility]  [place], [address] [place], [place] [address]  Resulting Agency  [facility] CL[facility] LAB [place] [facility] [place] [facility] [place] [facility] [place] [facility] [place] [facility]         Specimen Collected: 10/19/21 09:02 Last Resulted: 10/19/21 09:22

## 2021-10-19 NOTE — Progress Notes (Signed)
HR 104, Per Dr. Rogue Bussing okay to proceed with Imfinzi treatment as scheduled.

## 2021-10-20 ENCOUNTER — Encounter: Payer: Self-pay | Admitting: Internal Medicine

## 2021-10-20 ENCOUNTER — Other Ambulatory Visit: Payer: Self-pay | Admitting: Internal Medicine

## 2021-10-20 NOTE — Telephone Encounter (Signed)
Per Dr B, "H/A- please inform p/wife that my has reviewed the labs- and if no fevers- will monitor for now.; recheck at next visit"  Call returnmed to wife and advised of doctor response and she is in agreement. I advised that if patient starts running fever to call us right away. She stated "I will"

## 2021-10-21 ENCOUNTER — Encounter: Payer: Self-pay | Admitting: Gastroenterology

## 2021-10-21 ENCOUNTER — Encounter: Payer: Self-pay | Admitting: Internal Medicine

## 2021-10-21 NOTE — H&P (Signed)
Pre-Procedure H&P   Patient ID: Rodney Vega is a 66 y.o. male.  Gastroenterology Provider: Jaynie Collins, DO  Referring Provider: Jacob Moores, PA PCP: [name], MD  Date: 10/22/2021  HPI Mr. Rodney Vega is a 66 y.o. male who presents today for Colonoscopy for Abnormal CT imaging with cecal mass.  Patient with lung cancer on therapy after completing radiation in October 2022 and deemed unresectable.  He underwent a CT scan on January 8 which demonstrated a 3.8 x 3.1 cm soft tissue mass in the cecum involving the appendiceal orifice.  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  This was done laparoscopically with adhesion takedown and sigmoid resection.  Patient is a his output has been normal without blood or melena.  Last reported hemoglobin 8.2 MCV 95 platelets 205,000 albumin 2.8  Active 1 pack/day smoker  No other acute GI complaints  Past Medical History:  Diagnosis Date   Cancer (HCC)    Degenerative arthritis of knee    Depression    Diabetes mellitus without complication (HCC)    Diverticulitis    Glaucoma    since 2004   Hernia 1979   History of kidney stones    Hypertension    Personal history of tobacco use, presenting hazards to health    Pre-diabetes    Screening for obesity    Special screening for malignant neoplasms, colon    Wears dentures    full upper and lower    Past Surgical History:  Procedure Laterality Date   BACK SURGERY  1994, 2012   lumbar bulging disc   CATARACT EXTRACTION W/PHACO Left 01/20/2021   Procedure: CATARACT EXTRACTION PHACO AND INTRAOCULAR LENS PLACEMENT (IOC) LEFT;  Surgeon: Galen Manila, MD;  Location: [facility];  Service: Ophthalmology;  Laterality: Left;  10.13 0:52.1   COLONOSCOPY  [id number]   [facility]/Dr. Carmell Austria sessile polyp,78mm, found in rectum,multiple diverticula were found in the sigmoid, in descending and in transverse  colon   COLOSTOMY  04-20-13   HERNIA REPAIR  1979   inguinal   IR IMAGING GUIDED PORT INSERTION  06/10/2021   JOINT REPLACEMENT Right    knee   KNEE ARTHROPLASTY Right 05/08/2018   Procedure: COMPUTER ASSISTED TOTAL KNEE ARTHROPLASTY;  Surgeon: Donato Heinz, MD;  Location: [facility];  Service: Orthopedics;  Laterality: Right;   KNEE ARTHROPLASTY Left 11/16/2019   Procedure: COMPUTER ASSISTED TOTAL KNEE ARTHROPLASTY;  Surgeon: Donato Heinz, MD;  Location: [facility];  Service: Orthopedics;  Laterality: Left;   LAPAROTOMY  04-20-13   colon resection with colosotmy   LITHOTRIPSY  2013   TONSILLECTOMY AND ADENOIDECTOMY  1962    Family History No h/o GI disease or malignancy  Review of Systems  Constitutional:  Negative for activity change, appetite change, chills, diaphoresis, fatigue, fever and unexpected weight change.  HENT:  Negative for trouble swallowing and voice change.   Respiratory:  Negative for shortness of breath and wheezing.   Cardiovascular:  Negative for chest pain, palpitations and leg swelling.  Gastrointestinal:  Positive for abdominal pain. Negative for abdominal distention, anal bleeding, blood in stool, constipation, diarrhea, nausea and vomiting.  Musculoskeletal:  Negative for arthralgias and myalgias.  Skin:  Negative for color change and pallor.  Neurological:  Negative for dizziness, syncope and weakness.  Psychiatric/Behavioral:  Negative for confusion. The patient is not nervous/anxious.   All other systems reviewed and are negative.   Medications Current  Facility-Administered Medications on File Prior to Encounter  Medication Dose Route Frequency Provider Last Rate Last Admin   heparin lock flush 100 UNIT/ML injection            heparin lock flush 100 UNIT/ML injection            Current Outpatient Medications on File Prior to Encounter  Medication Sig Dispense Refill   albuterol (VENTOLIN HFA) 108 (90 Base) MCG/ACT inhaler Inhale 2 puffs into the lungs  every 6 (six) hours as needed for wheezing or shortness of breath. 1 each 2   baclofen (LIORESAL) 10 MG tablet Take 5 mg by mouth at bedtime as needed for muscle spasms.     cetirizine (ZYRTEC) 10 MG tablet Take 10 mg by mouth daily.     dorzolamide-timolol (COSOPT) 22.3-6.8 MG/ML ophthalmic solution Place 1 drop into both eyes 2 (two) times daily.     glipiZIDE (GLUCOTROL) 5 MG tablet Take 1 tablet (5 mg total) by mouth 2 (two) times daily before a meal. 60 tablet 3   montelukast (SINGULAIR) 10 MG tablet TAKE 1 TABLET(10 MG) BY MOUTH AT BEDTIME 90 tablet 0   olmesartan (BENICAR) 40 MG tablet Take 40 mg by mouth daily.     tamsulosin (FLOMAX) 0.4 MG CAPS capsule Take 0.4 mg by mouth daily.     venlafaxine XR (EFFEXOR-XR) 75 MG 24 hr capsule Take 225 mg by mouth daily.     acetaminophen (TYLENOL) 500 MG tablet Take 1,000 mg by mouth daily.     blood glucose meter kit and supplies KIT Check your blood glucose levels twice a day; once in the morning before breakfast; and once in the evening after dinner. 1 each 0   Cyanocobalamin (VITAMIN B-12 PO) Take by mouth daily.     diphenhydramine-acetaminophen (TYLENOL PM) 25-500 MG TABS Take 1-2 tablets by mouth at bedtime as needed (sleep.).      fluticasone-salmeterol (ADVAIR DISKUS) 500-50 MCG/ACT AEPB Inhale 1 puff into the lungs in the morning and at bedtime. 1 each 3   glucose blood test strip Check your blood glucose levels twice a day; once in the morning before breakfast; and once in the evening after dinner. 100 each 12   guaiFENesin-codeine 100-10 MG/5ML syrup Take 5 mLs by mouth daily as needed for cough. 120 mL 0   Lancets (FREESTYLE) lancets Check your blood glucose levels twice a day; once in the morning before breakfast; and once in the evening after dinner. 100 each 12   latanoprost (XALATAN) 0.005 % ophthalmic solution Place 1 drop into both eyes at bedtime.     nicotine (NICODERM CQ - DOSED IN MG/24 HOURS) 21 mg/24hr patch Place 1 patch (21  mg total) onto the skin daily. (Patient not taking: Reported on 09/24/2021) 28 patch 1   pregabalin (LYRICA) 50 MG capsule Take 1 capsule (50 mg total) by mouth 2 (two) times daily. 60 capsule 1   sucralfate (CARAFATE) 1 g tablet Take 1 tablet (1 g total) by mouth 3 (three) times daily. Dissolve in 3-4 tbsp warm water, swish and swallow (Patient not taking: Reported on 09/24/2021) 90 tablet 1   traMADol (ULTRAM) 50 MG tablet TAKE 1 TABLET(50 MG) BY MOUTH EVERY 8 HOURS AS NEEDED (Patient not taking: Reported on 09/24/2021) 60 tablet 0   [DISCONTINUED] prochlorperazine (COMPAZINE) 10 MG tablet TAKE 1 TABLET(10 MG) BY MOUTH EVERY 6 HOURS AS NEEDED FOR NAUSEA OR VOMITING (Patient not taking: Reported on 08/17/2021) 30 tablet 1  Pertinent medications related to GI and procedure were reviewed by me with the patient prior to the procedure   Current Facility-Administered Medications:    0.9 %  sodium chloride infusion, , Intravenous, Continuous, Antar, Milks, DO, Last Rate: 20 mL/hr at 10/22/21 1045, New Bag at 10/22/21 1045  Facility-Administered Medications Ordered in Other Encounters:    heparin lock flush 100 UNIT/ML injection, , , ,    heparin lock flush 100 UNIT/ML injection, , , ,    heparin lock flush 100 UNIT/ML injection, , , ,       Allergies  Allergen Reactions   Paclitaxel Shortness Of Breath   Ambien [Zolpidem]     Causes confusion    Nsaids     Abdominal pain   Aleve [Naproxen Sodium] Itching   Allergies were reviewed by me prior to the procedure  Objective    Vitals:   10/22/21 1032  BP: 112/67  Pulse: 99  Resp: 18  Temp: 97.7 F (36.5 C)  TempSrc: Temporal  SpO2: 99%  Weight: 86.2 kg  Height: $Remove'5\' 11"'[id number]$  (1.803 m)     Physical Exam Vitals and nursing note reviewed.  Constitutional:      General: He is not in acute distress.    Appearance: Normal appearance. He is not ill-appearing, toxic-appearing or diaphoretic.  HENT:     Head: Normocephalic and  atraumatic.     Nose: Nose normal.     Mouth/Throat:     Mouth: Mucous membranes are moist.     Pharynx: Oropharynx is clear.  Eyes:     General: No scleral icterus.    Extraocular Movements: Extraocular movements intact.  Cardiovascular:     Rate and Rhythm: Normal rate and regular rhythm.     Heart sounds: Normal heart sounds. No murmur heard.   No friction rub. No gallop.  Pulmonary:     Effort: Pulmonary effort is normal. No respiratory distress.     Breath sounds: No wheezing, rhonchi or rales.     Comments: Diminished breath sounds bilaterally Abdominal:     General: There is no distension.     Palpations: Abdomen is soft.     Tenderness: There is no abdominal tenderness. There is no guarding or rebound.     Comments: Colostomy in place. LLQ  Musculoskeletal:     Cervical back: Neck supple.     Right lower leg: No edema.     Left lower leg: No edema.  Skin:    General: Skin is warm and dry.     Coloration: Skin is not jaundiced or pale.  Neurological:     General: No focal deficit present.     Mental Status: He is alert and oriented to person, place, and time. Mental status is at baseline.  Psychiatric:        Mood and Affect: Mood normal.        Behavior: Behavior normal.        Thought Content: Thought content normal.        Judgment: Judgment normal.     Assessment:  Mr. Rodney Vega is a 66 y.o. male  who presents today for Colonoscopy through ostomy stoma for Abnormal CT imaging with cecal mass.  Plan:  Colonoscopy with possible intervention today  Colonoscopy with possible biopsy, control of bleeding, polypectomy, and interventions as necessary has been discussed with the patient/patient representative. Informed consent was obtained from the patient/patient representative after explaining the indication, nature, and risks of the procedure including but  not limited to death, bleeding, perforation, missed neoplasm/lesions, cardiorespiratory compromise, and  reaction to medications. Opportunity for questions was given and appropriate answers were provided. Patient/patient representative has verbalized understanding is amenable to undergoing the procedure.   Annamaria Helling, DO  [facility]  Portions of the record may have been created with voice recognition software. Occasional wrong-word or 'sound-a-like' substitutions may have occurred due to the inherent limitations of voice recognition software.  Read the chart carefully and recognize, using context, where substitutions may have occurred.

## 2021-10-22 ENCOUNTER — Ambulatory Visit
Admission: RE | Admit: 2021-10-22 | Discharge: 2021-10-22 | Disposition: A | Discharge status: Home / Self Care | Payer: Medicare PPO | Attending: Gastroenterology | Admitting: Gastroenterology

## 2021-10-22 ENCOUNTER — Ambulatory Visit: Payer: Medicare PPO | Admitting: Certified Registered"

## 2021-10-22 ENCOUNTER — Encounter
Admission: RE | Disposition: A | Discharge status: Home / Self Care | Payer: Self-pay | Source: Home / Self Care | Attending: Gastroenterology

## 2021-10-22 ENCOUNTER — Encounter: Payer: Self-pay | Admitting: Gastroenterology

## 2021-10-22 DIAGNOSIS — Z933 Colostomy status: Secondary | ICD-10-CM | POA: Diagnosis not present

## 2021-10-22 DIAGNOSIS — Z923 Personal history of irradiation: Secondary | ICD-10-CM | POA: Diagnosis not present

## 2021-10-22 DIAGNOSIS — Z7984 Long term (current) use of oral hypoglycemic drugs: Secondary | ICD-10-CM | POA: Diagnosis not present

## 2021-10-22 DIAGNOSIS — E119 Type 2 diabetes mellitus without complications: Secondary | ICD-10-CM | POA: Insufficient documentation

## 2021-10-22 DIAGNOSIS — F32A Depression, unspecified: Secondary | ICD-10-CM | POA: Insufficient documentation

## 2021-10-22 DIAGNOSIS — I10 Essential (primary) hypertension: Secondary | ICD-10-CM | POA: Diagnosis not present

## 2021-10-22 DIAGNOSIS — Z7951 Long term (current) use of inhaled steroids: Secondary | ICD-10-CM | POA: Insufficient documentation

## 2021-10-22 DIAGNOSIS — R933 Abnormal findings on diagnostic imaging of other parts of digestive tract: Secondary | ICD-10-CM | POA: Insufficient documentation

## 2021-10-22 DIAGNOSIS — Z85118 Personal history of other malignant neoplasm of bronchus and lung: Secondary | ICD-10-CM | POA: Insufficient documentation

## 2021-10-22 DIAGNOSIS — Z79899 Other long term (current) drug therapy: Secondary | ICD-10-CM | POA: Insufficient documentation

## 2021-10-22 DIAGNOSIS — D120 Benign neoplasm of cecum: Secondary | ICD-10-CM | POA: Diagnosis not present

## 2021-10-22 DIAGNOSIS — F17210 Nicotine dependence, cigarettes, uncomplicated: Secondary | ICD-10-CM | POA: Insufficient documentation

## 2021-10-22 DIAGNOSIS — R948 Abnormal results of function studies of other organs and systems: Secondary | ICD-10-CM | POA: Diagnosis present

## 2021-10-22 HISTORY — PX: COLONOSCOPY WITH PROPOFOL: SHX5780

## 2021-10-22 HISTORY — DX: Type 2 diabetes mellitus without complications: E11.9

## 2021-10-22 LAB — GLUCOSE, CAPILLARY: Glucose-Capillary: 155 mg/dL — ABNORMAL HIGH (ref 70–99)

## 2021-10-22 SURGERY — COLONOSCOPY WITH PROPOFOL
Anesthesia: General

## 2021-10-22 MED ORDER — EPHEDRINE SULFATE (PRESSORS) 50 MG/ML IJ SOLN
INTRAMUSCULAR | Status: DC | PRN
  Administered 2021-10-22: 5 mg via INTRAVENOUS
  Administered 2021-10-22: 10 mg via INTRAVENOUS

## 2021-10-22 MED ORDER — PHENYLEPHRINE HCL (PRESSORS) 10 MG/ML IV SOLN
INTRAVENOUS | Status: DC | PRN
  Administered 2021-10-22: 80 ug via INTRAVENOUS

## 2021-10-22 MED ORDER — EPHEDRINE 5 MG/ML INJ
INTRAVENOUS | Status: AC
  Filled 2021-10-22: qty 5

## 2021-10-22 MED ORDER — PHENYLEPHRINE HCL-NACL 20-0.9 MG/250ML-% IV SOLN
INTRAVENOUS | Status: AC
  Filled 2021-10-22: qty 250

## 2021-10-22 MED ORDER — PROPOFOL 10 MG/ML IV BOLUS
INTRAVENOUS | Status: DC | PRN
  Administered 2021-10-22: 70 mg via INTRAVENOUS

## 2021-10-22 MED ORDER — LIDOCAINE 2% (20 MG/ML) 5 ML SYRINGE
INTRAMUSCULAR | Status: DC | PRN
Start: 2021-10-22 — End: 2021-10-22
  Administered 2021-10-22: 20 mg via INTRAVENOUS

## 2021-10-22 MED ORDER — SODIUM CHLORIDE 0.9 % IV SOLN: INTRAVENOUS | Status: DC

## 2021-10-22 MED ORDER — PROPOFOL 500 MG/50ML IV EMUL
INTRAVENOUS | Status: DC | PRN
  Administered 2021-10-22: 120 ug/kg/min via INTRAVENOUS

## 2021-10-22 NOTE — Anesthesia Preprocedure Evaluation (Signed)
Anesthesia Evaluation  Patient identified by MRN, date of birth, ID band Patient awake    Reviewed: Allergy & Precautions, NPO status , Patient's Chart, lab work & pertinent test results  History of Anesthesia Complications Negative for: history of anesthetic complications  Airway Mallampati: II  TM Distance: >3 FB Neck ROM: Full    Dental  (+) Upper Dentures, Lower Dentures   Pulmonary neg sleep apnea, neg COPD, Current Smoker and Patient abstained from smoking.,    Pulmonary exam normal breath sounds clear to auscultation       Cardiovascular Exercise Tolerance: Good METShypertension, Pt. on medications (-) CAD and (-) Past MI (-) dysrhythmias  Rhythm:Regular Rate:Normal - Systolic murmurs    Neuro/Psych PSYCHIATRIC DISORDERS Depression negative neurological ROS     GI/Hepatic neg GERD  ,(+)     (-) substance abuse  ,   Endo/Other  diabetes  Renal/GU negative Renal ROS     Musculoskeletal  (+) Arthritis ,   Abdominal   Peds  Hematology   Anesthesia Other Findings Past Medical History: No date: Cancer ([place]) No date: Degenerative arthritis of knee No date: Depression No date: Diabetes mellitus without complication (HCC) No date: Diverticulitis No date: Glaucoma     Comment:  since 2004 1979: Hernia No date: History of kidney stones No date: Hypertension No date: Personal history of tobacco use, presenting hazards to health No date: Pre-diabetes No date: Screening for obesity No date: Special screening for malignant neoplasms, colon No date: Wears dentures     Comment:  full upper and lower  Reproductive/Obstetrics                             Anesthesia Physical  Anesthesia Plan  ASA: 3  Anesthesia Plan: General   Post-op Pain Management: Minimal or no pain anticipated   Induction: Intravenous  PONV Risk Score and Plan: 1 and TIVA, Treatment may vary due to age or  medical condition, Ondansetron and Propofol infusion  Airway Management Planned: Natural Airway and Nasal Cannula  Additional Equipment: None  Intra-op Plan:   Post-operative Plan:   Informed Consent: I have reviewed the patients History and Physical, chart, labs and discussed the procedure including the risks, benefits and alternatives for the proposed anesthesia with the patient or authorized representative who has indicated his/her understanding and acceptance.     Dental advisory given  Plan Discussed with: CRNA  Anesthesia Plan Comments: (Discussed risks of anesthesia with patient, including possibility of difficulty with spontaneous ventilation under anesthesia necessitating airway intervention, PONV, and rare risks such as cardiac or respiratory or neurological events, and allergic reactions. Discussed the role of CRNA in patient's perioperative care. Patient understands.)        Anesthesia Quick Evaluation

## 2021-10-22 NOTE — Anesthesia Postprocedure Evaluation (Signed)
Anesthesia Post Note  Patient: Rodney Vega  Procedure(s) Performed: COLONOSCOPY WITH PROPOFOL  Patient location during evaluation: PACU Anesthesia Type: General Level of consciousness: awake and alert, oriented and patient cooperative Pain management: pain level controlled Vital Signs Assessment: post-procedure vital signs reviewed and stable Respiratory status: spontaneous breathing, nonlabored ventilation and respiratory function stable Cardiovascular status: blood pressure returned to baseline and stable Postop Assessment: adequate PO intake Anesthetic complications: no   No notable events documented.   Last Vitals:  Vitals:   10/22/21 1254 10/22/21 1304  BP: (!) 105/59 101/62  Pulse: (!) 113 (!) 104  Resp: 18 15  Temp:    SpO2: 100% 98%    Last Pain:  Vitals:   10/22/21 1304  TempSrc:   PainSc: 0-No pain                 Darrin Nipper

## 2021-10-22 NOTE — Interval H&P Note (Signed)
History and Physical Interval Note: Preprocedure H&P from 10/22/21  was reviewed and there was no interval change after seeing and examining the patient.  Written consent was obtained from the patient after discussion of risks, benefits, and alternatives. Patient has consented to proceed with Colonoscopy with possible intervention   10/22/2021 12:06 PM  Rodney Vega  has presented today for surgery, with the diagnosis of R93.3  - Abnormal CT scan, colon Z93.3  - Status post colostomy.  The various methods of treatment have been discussed with the patient and family. After consideration of risks, benefits and other options for treatment, the patient has consented to  Procedure(s) with comments: COLONOSCOPY WITH PROPOFOL (N/A) - DM as a surgical intervention.  The patient's history has been reviewed, patient examined, no change in status, stable for surgery.  I have reviewed the patient's chart and labs.  Questions were answered to the patient's satisfaction.     Annamaria Helling

## 2021-10-22 NOTE — OR Nursing (Signed)
Colonoscope inserted through the colostomy stoma at 1218.

## 2021-10-22 NOTE — Op Note (Addendum)
[facility] Patient Name: Rodney Vega Procedure Date: 10/22/2021 12:00 PM MRN: [id number] Account #: [id number] Date of Birth: [birth year] Admit Type: Outpatient Age: 66 Room: [facility] ENDO ROOM 1 Gender: Male Note Status: [place] Instrument Name: Peds Colonoscope [id number] Procedure:             Colonoscopy Indications:           Abnormal CT of the GI tract Providers:             Rueben Bash, DO Referring MD:          Irven Easterly. Kary Kos, MD (Referring MD) Medicines:             Monitored Anesthesia Care Complications:         No immediate complications. Estimated blood loss:                         Minimal. Procedure:             Pre-Anesthesia Assessment:                        - Prior to the procedure, a History and Physical was                         performed, and patient medications and allergies were                         reviewed. The patient is competent. The risks and                         benefits of the procedure and the sedation options and                         risks were discussed with the patient. All questions                         were answered and informed consent was obtained.                         Patient identification and proposed procedure were                         verified by the physician, the nurse, the anesthetist                         and the technician in the endoscopy suite. Mental                         Status Examination: alert and oriented. Airway                         Examination: normal oropharyngeal airway and neck                         mobility. Respiratory Examination: clear to                         auscultation. CV Examination: RRR, no murmurs, no S3  or S4. Prophylactic Antibiotics: The patient does not                         require prophylactic antibiotics. Prior                         Anticoagulants: The patient has taken no previous                          anticoagulant or antiplatelet agents. ASA Grade                         Assessment: III - A patient with severe systemic                         disease. After reviewing the risks and benefits, the                         patient was deemed in satisfactory condition to                         undergo the procedure. The anesthesia plan was to use                         monitored anesthesia care (MAC). Immediately prior to                         administration of medications, the patient was                         re-assessed for adequacy to receive sedatives. The                         heart rate, respiratory rate, oxygen saturations,                         blood pressure, adequacy of pulmonary ventilation, and                         response to care were monitored throughout the                         procedure. The physical status of the patient was                         re-assessed after the procedure.                        After obtaining informed consent, the colonoscope was                         passed under direct vision. Throughout the procedure,                         the patient's blood pressure, pulse, and oxygen                         saturations were monitored continuously. The  Colonoscope was introduced through the descending                         colostomy and advanced to the the cecum, identified by                         appendiceal orifice and ileocecal valve. The                         colonoscopy was performed without difficulty. The                         patient tolerated the procedure well. The quality of                         the bowel preparation was good. The ileocecal valve,                         appendiceal orifice, and rectum were photographed. Findings:      Stoma normal with no lesions or stenosis. Estimated blood loss: none.      A frond-like/villous non-obstructing mass was found in the cecum. The        mass was non-circumferential. The mass measured three cm in length. In       addition, its diameter measured three mm. No bleeding was present.       Biopsies were taken with a cold forceps for histology. Estimated blood       loss was minimal.      The exam was otherwise without abnormality. 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 Biopsied.                        - The examination was otherwise normal. Recommendation:        - Discharge patient to home.                        - Resume previous diet.                        - Continue present medications.                        - Await pathology results.                        - Return to referring physician.                        - The findings and recommendations were discussed with                         the patient's family.                        - Refer to a colo-rectal or general surgeon at                         appointment to be scheduled. Procedure Code(s):     --- Professional ---  44389, Colonoscopy through stoma; with biopsy, single                         or multiple Diagnosis Code(s):     --- Professional ---                        D49.0, Neoplasm of unspecified behavior of digestive                         system                        R93.3, Abnormal findings on diagnostic imaging of                         other parts of digestive tract CPT copyright 2019 [employer]. All rights reserved. The codes documented in this report are preliminary and upon coder review may  be revised to meet current compliance requirements. Volney American, DO Annamaria Helling DO, DO 10/22/2021 12:43:30 PM This report has been signed electronically. Number of Addenda: 0 Note Initiated On: 10/22/2021 12:00 PM Scope Withdrawal Time: 0 hours 10 minutes 9 seconds  Total Procedure Duration: 0 hours 19 minutes 5 seconds  Estimated Blood Loss:  Estimated blood loss was minimal.       [facility]

## 2021-10-22 NOTE — Transfer of Care (Signed)
Immediate Anesthesia Transfer of Care Note  Patient: Rodney Vega  Procedure(s) Performed: COLONOSCOPY WITH PROPOFOL  Patient Location: Endoscopy Unit  Anesthesia Type:General  Level of Consciousness: drowsy  Airway & Oxygen Therapy: Patient Spontanous Breathing  Post-op Assessment: Report given to RN and Post -op Vital signs reviewed and stable  Post vital signs: Reviewed and stable  Last Vitals:  Vitals Value Taken Time  BP 101/59 10/22/21 1244  Temp    Pulse 109 10/22/21 1244  Resp 14 10/22/21 1244  SpO2 100 % 10/22/21 1244  Vitals shown include unvalidated device data.  Last Pain:  Vitals:   10/22/21 1032  TempSrc: Temporal  PainSc: 0-No pain         Complications: No notable events documented.

## 2021-10-23 ENCOUNTER — Encounter: Payer: Self-pay | Admitting: Gastroenterology

## 2021-10-23 LAB — SURGICAL PATHOLOGY

## 2021-10-26 ENCOUNTER — Other Ambulatory Visit: Payer: Medicare PPO

## 2021-10-26 ENCOUNTER — Ambulatory Visit: Payer: Medicare PPO | Admitting: Internal Medicine

## 2021-10-26 ENCOUNTER — Ambulatory Visit: Payer: Medicare PPO

## 2021-10-30 ENCOUNTER — Telehealth: Payer: Self-pay | Admitting: *Deleted

## 2021-10-30 NOTE — Telephone Encounter (Signed)
Wife called reporting that patient is very weak and almost passed out yesterday and is asking what needs to be done. I advised her that this late in the day if she feels he needs to be seen, she needs to take him to the ER for evaluation. We discussed that he can be seen Monday if needed, but she said that he already has an appointment Monday for infusion I advised he go to ER over weekend if worsens and he does not go today.

## 2021-11-02 ENCOUNTER — Telehealth: Payer: Self-pay | Admitting: *Deleted

## 2021-11-02 ENCOUNTER — Inpatient Hospital Stay (HOSPITAL_BASED_OUTPATIENT_CLINIC_OR_DEPARTMENT_OTHER): Payer: Medicare PPO | Admitting: Internal Medicine

## 2021-11-02 ENCOUNTER — Other Ambulatory Visit: Payer: Self-pay

## 2021-11-02 ENCOUNTER — Inpatient Hospital Stay: Payer: Medicare PPO

## 2021-11-02 DIAGNOSIS — C3411 Malignant neoplasm of upper lobe, right bronchus or lung: Secondary | ICD-10-CM | POA: Diagnosis not present

## 2021-11-02 DIAGNOSIS — Z5112 Encounter for antineoplastic immunotherapy: Secondary | ICD-10-CM | POA: Diagnosis not present

## 2021-11-02 DIAGNOSIS — C3490 Malignant neoplasm of unspecified part of unspecified bronchus or lung: Secondary | ICD-10-CM

## 2021-11-02 LAB — COMPREHENSIVE METABOLIC PANEL
ALT: 33 U/L (ref 0–44)
AST: 36 U/L (ref 15–41)
Albumin: 2.5 g/dL — ABNORMAL LOW (ref 3.5–5.0)
Alkaline Phosphatase: 123 U/L (ref 38–126)
Anion gap: 11 (ref 5–15)
BUN: 11 mg/dL (ref 8–23)
CO2: 25 mmol/L (ref 22–32)
Calcium: 8.7 mg/dL — ABNORMAL LOW (ref 8.9–10.3)
Chloride: 100 mmol/L (ref 98–111)
Creatinine, Ser: 0.95 mg/dL (ref 0.61–1.24)
GFR, Estimated: >60 mL/min (ref >60)
Glucose, Bld: 356 mg/dL — ABNORMAL HIGH (ref 70–99)
Potassium: 3.5 mmol/L (ref 3.5–5.1)
Sodium: 136 mmol/L (ref 135–145)
Total Bilirubin: 0.5 mg/dL (ref 0.3–1.2)
Total Protein: 7.2 g/dL (ref 6.5–8.1)

## 2021-11-02 LAB — CBC WITH DIFFERENTIAL/PLATELET
Abs Immature Granulocytes: 0.05 10*3/uL (ref 0.00–0.07)
Basophils Absolute: 0.1 10*3/uL (ref 0.0–0.1)
Basophils Relative: 1 %
Eosinophils Absolute: 0.3 10*3/uL (ref 0.0–0.5)
Eosinophils Relative: 3 %
HCT: 27.9 % — ABNORMAL LOW (ref 39.0–52.0)
Hemoglobin: 8.3 g/dL — ABNORMAL LOW (ref 13.0–17.0)
Immature Granulocytes: 0 %
Lymphocytes Relative: 7 %
Lymphs Abs: 0.8 10*3/uL (ref 0.7–4.0)
MCH: 27.4 pg (ref 26.0–34.0)
MCHC: 29.7 g/dL — ABNORMAL LOW (ref 30.0–36.0)
MCV: 92.1 fL (ref 80.0–100.0)
Monocytes Absolute: 0.5 10*3/uL (ref 0.1–1.0)
Monocytes Relative: 4 %
Neutro Abs: 9.7 10*3/uL — ABNORMAL HIGH (ref 1.7–7.7)
Neutrophils Relative %: 85 %
Platelets: 201 10*3/uL (ref 150–400)
RBC: 3.03 MIL/uL — ABNORMAL LOW (ref 4.22–5.81)
RDW: 15.7 % — ABNORMAL HIGH (ref 11.5–15.5)
WBC: 11.4 10*3/uL — ABNORMAL HIGH (ref 4.0–10.5)
nRBC: 0 % (ref 0.0–0.2)

## 2021-11-02 LAB — TSH: TSH: 3.429 u[IU]/mL (ref 0.350–4.500)

## 2021-11-02 MED ORDER — GUAIFENESIN-CODEINE 100-10 MG/5ML PO SOLN: 5.0000 mL | Freq: Every day | ORAL | 0 refills | Status: DC | PRN

## 2021-11-02 MED ORDER — HEPARIN SOD (PORK) LOCK FLUSH 100 UNIT/ML IV SOLN
500.0000 [IU] | Freq: Once | INTRAVENOUS | Status: AC
  Administered 2021-11-02: 500 [IU] via INTRAVENOUS
  Filled 2021-11-02: qty 5

## 2021-11-02 MED ORDER — SODIUM CHLORIDE 0.9 % IV SOLN
Freq: Once | INTRAVENOUS | Status: AC
  Filled 2021-11-02: qty 250

## 2021-11-02 MED ORDER — NEBULIZER DEVI: 0 refills | Status: DC

## 2021-11-02 MED ORDER — IPRATROPIUM-ALBUTEROL 0.5-2.5 (3) MG/3ML IN SOLN: 3.0000 mL | RESPIRATORY_TRACT | 3 refills | Status: DC | PRN

## 2021-11-02 NOTE — Telephone Encounter (Signed)
Spouse requests Tramadol prescription as discussed in this morning's visit.

## 2021-11-02 NOTE — Progress Notes (Signed)
[name]  Patient Care Team: [name], MD as PCP - General (Family Medicine) [name], RN as Oncology Nurse Navigator [name], MD as Consulting Physician (Urology) [name], MD as Consulting Physician (Oncology)  CHIEF COMPLAINTS/PURPOSE OF CONSULTATION: lung cancer    Oncology History Overview Note  AUG 2022- 8.2 x 5.9 cm spiculated mass noted in right upper lobe consistent with malignancy. It extends from the right hilum to the lateral chest wall and results in lytic destruction of the lateral portion of the right fourth rib.  Mediastinal lymph nodes are noted, including 12 mm right hilar lymph node, consistent with metastatic disease. 3 cm right adrenal mass is noted concerning for metastatic disease.  # AUG 2022- PET IMPRESSION: Peripherally hypermetabolic right upper lobe mass, likely due to central necrosis. Mass extends into the right hilum and right lateral chest wall involving the second through fourth right ribs.   Mildly enlarged and hypermetabolic right hilar lymph node.   No evidence of metastatic disease in the abdomen or pelvis.  # AUG, 2022-RIGHT CHEST WALL Bx-necrosis; suspicious for malignancy not definitive [discussed with Dr.Kraynie;MDT] LUNG MASS, RIGHT; BIOPSY:  - SUSPICIOUS FOR MALIGNANCY.  - SEE COMMENT.   Comment:  Approximately six tissue cores demonstrate inflamed fibrous capsule with  hemosiderin-laden macrophages, in a background of abundant necrosis.  One of the cores demonstrates a minute fragment of viable epithelial  cells.  These cells are positive for p40.  They are negative for TTF1  and CK7. The cells of interest disappear on deeper cut levels.  While  the findings are suspicious for squamous cell carcinoma, there is not  enough viable tumor for a definitive diagnosis.  Additional tissue  sampling may be helpful.   # SEP 2022-cycle #2 Taxol hypersensitive reaction.  Switched  over to #3 cycle- carbo-Abraxane starting 06/29/2021  # DEC 2022- s/p ID- Dr.ravishankar- ? Lung abscess-no antibiotics-  JAN 2023- CT scan incidental- cecal mass- colonoscopy[Dr.Russo; [facility]- high grade dysplasia]  S/p evaluation with Dr. [name] surgery for now]  # S/p evaluation with Dr. [name].   Primary cancer of right upper lobe of lung ([place])  06/03/2021 Initial Diagnosis   Primary cancer of right upper lobe of lung ([place])   06/03/2021 Cancer Staging   Staging form: Lung, AJCC 8th Edition - Clinical: Stage IIIB (cT3, cN2, cM0) - Signed by [name], MD on 06/03/2021    06/15/2021 - 08/03/2021 Chemotherapy   Patient is on Treatment Plan : LUNG Carboplatin / Paclitaxel + XRT q7d     10/02/2021 -  Chemotherapy   Patient is on Treatment Plan : LUNG Durvalumab q14d        HISTORY OF PRESENTING ILLNESS: A in a wheelchair.  Accompanied by wife.  Rodney Vega 66 y.o.  male history of smoking -"lung cancer" [Limited necrotic tissue]-stage III unresectable currently on adjuvant IMFINZI is here for follow-up.  In the interim patient was evaluated by GI-underwent colonoscopy that showed a cecal mass.  S/p evaluation with Dr. Bary Castilla.  S/p evaluation with Dr. [name].  Patient overall feels weak.  Presyncope at the surgeon's office.  Limited mobility.  Shortness of breath extreme fatigue.   Continues to have coughing -spells. Denies any worsening chest wall pain.  No nausea no vomiting.  Overall feels poorly.  Review of Systems  Constitutional:  Positive for malaise/fatigue and weight loss. Negative for chills, diaphoresis and fever.  HENT:  Negative for nosebleeds and sore throat.  Eyes:  Negative for double vision.  Respiratory:  Positive for cough, sputum production and shortness of breath. Negative for hemoptysis and wheezing.   Cardiovascular:  Positive for chest pain. Negative for palpitations, orthopnea and leg swelling.  Gastrointestinal:  Positive for nausea.  Negative for abdominal pain, blood in stool, constipation, diarrhea, heartburn, melena and vomiting.  Genitourinary:  Negative for dysuria, frequency and urgency.  Musculoskeletal:  Negative for back pain and joint pain.  Skin: Negative.  Negative for itching and rash.  Neurological:  Positive for tingling. Negative for dizziness, focal weakness, weakness and headaches.  Endo/Heme/Allergies:  Does not bruise/bleed easily.  Psychiatric/Behavioral:  Negative for depression. The patient is not nervous/anxious and does not have insomnia.     MEDICAL HISTORY:  Past Medical History:  Diagnosis Date   Cancer ([place])    Degenerative arthritis of knee    Depression    Diabetes mellitus without complication ([place])    Diverticulitis    Glaucoma    since 2004   Hernia 1979   History of kidney stones    Hypertension    Personal history of tobacco use, presenting hazards to health    Pre-diabetes    Screening for obesity    Special screening for malignant neoplasms, colon    Wears dentures    full upper and lower    SURGICAL HISTORY: Past Surgical History:  Procedure Laterality Date   [place], 2012   lumbar bulging disc   CATARACT EXTRACTION W/PHACO Left 01/20/2021   Procedure: CATARACT EXTRACTION PHACO AND INTRAOCULAR LENS PLACEMENT ([place]) LEFT;  Surgeon: Birder Robson, MD;  Location: [facility];  Service: Ophthalmology;  Laterality: Left;  10.13 0:52.1   COLONOSCOPY  [id number]   [facility]/Dr. Buelah Manis sessile polyp,79mm, found in rectum,multiple diverticula were found in the sigmoid, in descending and in transverse colon   COLONOSCOPY WITH PROPOFOL N/A 10/22/2021   Procedure: COLONOSCOPY WITH PROPOFOL;  Surgeon: Annamaria Helling, DO;  Location: [facility];  Service: Gastroenterology;  Laterality: N/A;  DM   COLOSTOMY  04-20-13   HERNIA REPAIR  1979   inguinal   IR IMAGING GUIDED PORT INSERTION  06/10/2021   JOINT REPLACEMENT Right    knee   KNEE ARTHROPLASTY Right  05/08/2018   Procedure: COMPUTER ASSISTED TOTAL KNEE ARTHROPLASTY;  Surgeon: Dereck Leep, MD;  Location: [facility] OR[facility];  Service: Orthopedics;  Laterality: Right;   KNEE ARTHROPLASTY Left 11/16/2019   Procedure: COMPUTER ASSISTED TOTAL KNEE ARTHROPLASTY;  Surgeon: Dereck Leep, MD;  Location: [facility] OR[facility];  Service: Orthopedics;  Laterality: Left;   LAPAROTOMY  04-20-13   colon resection with colosotmy   LITHOTRIPSY  2013   TONSILLECTOMY AND ADENOIDECTOMY  1962    SOCIAL HISTORY: Social History   Socioeconomic History   Marital status: Married    Spouse name: Not on file   Number of children: Not on file   Years of education: Not on file   Highest education level: Not on file  Occupational History   Not on file  Tobacco Use   Smoking status: Every Day    Packs/day: 1.00    Years: 30.00    Pack years: 30.00    Types: Cigarettes   Smokeless tobacco: Never   Tobacco comments:    since age 89 or 67  Vaping Use   Vaping Use: Never used  Substance and Sexual Activity   Alcohol use: Yes    Alcohol/week: 2.0 standard drinks    Types: 2 Standard drinks  or equivalent per week    Comment: 1-2 drinks per week   Drug use: Yes    Types: Marijuana   Sexual activity: Not on file  Other Topics Concern   Not on file  Social History Narrative   15 mins [place]; smoking; not much alcohol. 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  MEDICATIONS:  Current  Outpatient Medications  Medication Sig Dispense Refill   acetaminophen (TYLENOL) 500 MG tablet Take 1,000 mg by mouth daily.     albuterol (VENTOLIN HFA) 108 (90 Base) MCG/ACT inhaler Inhale 2 puffs into the lungs every 6 (six) hours as needed for wheezing or shortness of breath. 1 each 2   baclofen (LIORESAL) 10 MG tablet Take 5 mg by mouth at bedtime as needed for muscle spasms.     blood glucose meter kit and supplies KIT Check your blood glucose levels twice a day; once in the morning before breakfast; and once in the evening after dinner. 1 each 0   cetirizine (ZYRTEC) 10 MG tablet Take 10 mg by mouth daily.     Cyanocobalamin (VITAMIN B-12 PO) Take by mouth daily.     diphenhydramine-acetaminophen (TYLENOL PM) 25-500 MG TABS Take 1-2 tablets by mouth at bedtime as needed (sleep.).      dorzolamide-timolol (COSOPT) 22.3-6.8 MG/ML ophthalmic solution Place 1 drop into both eyes 2 (two) times daily.     fluticasone-salmeterol (ADVAIR DISKUS) 500-50 MCG/ACT AEPB Inhale 1 puff into the lungs in the morning and at bedtime. 1 each 3   glipiZIDE (GLUCOTROL) 5 MG tablet Take 1 tablet (5 mg total) by mouth 2 (two) times daily before a meal. 60 tablet 3   glucose blood test strip Check your blood glucose levels twice a day; once in the morning before breakfast; and once in the evening after dinner. 100 each 12   ipratropium-albuterol (DUONEB) 0.5-2.5 (3) MG/3ML SOLN Take 3 mLs by nebulization every 4 (four) hours as needed. 360 mL 3   Lancets (FREESTYLE) lancets Check your blood glucose levels twice a day; once in the morning before breakfast; and once in the evening after dinner. 100 each 12   latanoprost (XALATAN) 0.005 % ophthalmic solution Place 1 drop into both eyes at bedtime.     montelukast (SINGULAIR) 10 MG tablet TAKE 1 TABLET(10 MG) BY MOUTH AT BEDTIME 90 tablet 0   nicotine (NICODERM CQ - DOSED IN MG/24 HOURS) 21 mg/24hr patch Place 1 patch (21 mg total) onto the skin daily. 28 patch 1    olmesartan (BENICAR) 40 MG tablet Take 40 mg by mouth daily.     pregabalin (LYRICA) 50 MG capsule Take 1 capsule (50 mg total) by mouth 2 (two) times daily. 60 capsule 1   sucralfate (CARAFATE) 1 g tablet Take 1 tablet (1 g total) by mouth 3 (three) times daily. Dissolve in 3-4 tbsp warm water, swish and swallow 90  tablet 1   tamsulosin (FLOMAX) 0.4 MG CAPS capsule Take 0.4 mg by mouth daily.     traMADol (ULTRAM) 50 MG tablet TAKE 1 TABLET(50 MG) BY MOUTH EVERY 8 HOURS AS NEEDED 60 tablet 0   traZODone (DESYREL) 50 MG tablet TAKE 1 TABLET(50 MG) BY MOUTH AT BEDTIME AS NEEDED FOR SLEEP 30 tablet 1   venlafaxine XR (EFFEXOR-XR) 75 MG 24 hr capsule Take 225 mg by mouth daily.     guaiFENesin-codeine 100-10 MG/5ML syrup Take 5 mLs by mouth daily as needed for cough. 120 mL 0   Respiratory Therapy Supplies (NEBULIZER) DEVI Use as directed 1 each 0   No current facility-administered medications for this visit.   Facility-Administered Medications Ordered in Other Visits  Medication Dose Route Frequency Provider Last Rate Last Admin   heparin lock flush 100 UNIT/ML injection            heparin lock flush 100 UNIT/ML injection            heparin lock flush 100 UNIT/ML injection               .  PHYSICAL EXAMINATION: ECOG PERFORMANCE STATUS: 1 - Symptomatic but completely ambulatory  Vitals:   11/02/21 0832  BP: 120/69  Pulse: (!) 115  Temp: 98.9 F (37.2 C)  SpO2: 99%    Filed Weights   11/02/21 0832  Weight: 185 lb 3.2 oz (84 kg)     Physical Exam Vitals and nursing note reviewed.  HENT:     Head: Normocephalic and atraumatic.     Mouth/Throat:     Pharynx: Oropharynx is clear.  Eyes:     Extraocular Movements: Extraocular movements intact.     Pupils: Pupils are equal, round, and reactive to light.  Cardiovascular:     Rate and Rhythm: Normal rate and regular rhythm.  Pulmonary:     Comments: Decreased breath sounds bilaterally.  Abdominal:     Palpations: Abdomen is  soft.  Musculoskeletal:        General: Normal range of motion.     Cervical back: Normal range of motion.  Skin:    General: Skin is warm.  Neurological:     General: No focal deficit present.     Mental Status: He is alert and oriented to person, place, and time.  Psychiatric:        Behavior: Behavior normal.        Judgment: Judgment normal.     LABORATORY DATA:  I have reviewed the data as listed Lab Results  Component Value Date   WBC 11.4 (H) 11/02/2021   HGB 8.3 (L) 11/02/2021   HCT 27.9 (L) 11/02/2021   MCV 92.1 11/02/2021   PLT 201 11/02/2021   Recent Labs    10/12/21 0845 10/19/21 0902 11/02/21 0805  NA 135 132* 136  K 3.2* 3.5 3.5  CL 103 97* 100  CO2 $Re'24 23 25  'BBr$ GLUCOSE 203* 290* 356*  BUN $Re'10 14 11  'bvo$ CREATININE 0.88 0.91 0.95  CALCIUM 8.3* 8.4* 8.7*  GFRNONAA >60 >60 >60  PROT 7.1 7.4 7.2  ALBUMIN 2.8* 2.6* 2.5*  AST 18 24 36  ALT 17 21 33  ALKPHOS 94 111 123  BILITOT 0.4 0.5 0.5    RADIOGRAPHIC STUDIES: I have personally reviewed the radiological images as listed and agreed with the findings in the report. CT ABDOMEN PELVIS W WO CONTRAST  Result Date: 10/11/2021 CLINICAL DATA:  Hematuria with left hydronephrosis EXAM: CT ABDOMEN  AND PELVIS WITHOUT AND WITH CONTRAST TECHNIQUE: Multidetector CT imaging of the abdomen and pelvis was performed following the standard protocol before and following the bolus administration of intravenous contrast. CONTRAST:  130mL OMNIPAQUE IOHEXOL 300 MG/ML  SOLN COMPARISON:  Chest CT 09/09/2021.  PET-CT 05/25/2021 FINDINGS: Lower chest: Tree-in-bud nodularity in the right middle and lower lobes is progressive since chest CT 09/09/2021. The large right upper lobe necrotic pulmonary mass lesion has not been included on today's study. Hepatobiliary: No suspicious focal abnormality within the liver parenchyma. There is no evidence for gallstones, gallbladder wall thickening, or pericholecystic fluid. No intrahepatic or extrahepatic  biliary dilation. Pancreas: No focal mass lesion. No dilatation of the main duct. No intraparenchymal cyst. No peripancreatic edema. Spleen: No splenomegaly. No focal mass lesion. Adrenals/Urinary Tract: 2.7 cm right adrenal nodule is stable since CT scan of 01/07/2012 consistent with benign etiology such as adenoma. Tiny 11 mm left adrenal nodule also stable since 2013 consistent with benign process. Precontrast imaging shows no stones in the right kidney or ureter. No secondary changes in the right kidney or ureter. Multiple layering tiny stones are seen in the left renal pelvis (image 39/2 on bone windows) with clustered tiny stones in lower pole calices of the left kidney. No stones in the upper or interpolar left kidney. There is mild left hydronephrosis, similar to multiple prior studies including the 2013 exam. Abrupt transition from dilated renal pelvis to nondilated proximal left ureter suggests UPJ obstruction. Left ureter unremarkable. No bladder stones. Imaging after IV contrast administration shows no suspicious enhancing lesion in either kidney. Delayed post-contrast imaging shows no wall thickening or soft tissue filling defect in either intrarenal collecting system or renal pelvis. Neither ureter is well opacified on delayed imaging, but there is no focal hydroureter, evidence of ureteral wall thickening, or mass lesion evident along the course of either ureter. Delayed imaging of the bladder shows no focal wall thickening or mass lesion. Stomach/Bowel: Stomach is unremarkable. No gastric wall thickening. No evidence of outlet obstruction. Duodenum is normally positioned as is the ligament of Treitz. Small fat density lesion again noted transverse duodenum consistent with lipoma. No small bowel wall thickening. No small bowel dilatation. The terminal ileum is normal. 3.8 x 3.1 cm soft tissue mass is identified in the cecum, involving the appendiceal orifice. This was present on but much less apparent  on noncontrast CT imaging performed as part of the PET-CT 05/25/2021 and showed low level hypermetabolism. The appendix is dilated up to about 12 mm diameter, likely secondary to obstruction from the soft tissue lesion. Left abdominal sigmoid end colostomy evident with Hartmann's pouch anatomy. Vascular/Lymphatic: There is mild atherosclerotic calcification of the abdominal aorta without aneurysm. There is no gastrohepatic or hepatoduodenal ligament lymphadenopathy. No retroperitoneal or mesenteric lymphadenopathy. No pelvic sidewall lymphadenopathy. Reproductive: The prostate gland and seminal vesicles are unremarkable. Other: No intraperitoneal free fluid. Musculoskeletal: No worrisome lytic or sclerotic osseous abnormality. IMPRESSION: 1. 3.8 x 3.1 cm soft tissue mass in the cecum, involving the appendiceal orifice. This was present on but much less apparent on the noncontrast CT imaging performed as part of the PET-CT 05/25/2021 and showed low level hypermetabolism on the PET images. Lesion was not present on CT from 02/20/2017. Imaging features are highly concerning for neoplasm. Appendix is dilated without periappendiceal edema or inflammation, likely secondary to obstruction from the cecal lesion. 2. Mild left hydronephrosis with abrupt transition from dilated renal pelvis to nondilated proximal left ureter. Imaging features suggest UPJ obstruction.  3. Multiple layering tiny stones in the left renal pelvis with clustered tiny stones in lower pole calices of the left kidney. 4. Progression of tree-in-bud nodularity in the right middle and lower lobes consistent with atypical infection. The known large necrotic right upper lobe pulmonary mass lesion has not been included on today's study. 5. Stable bilateral adrenal nodules since 2013 consistent with benign etiology such as adenomas. 6. Aortic Atherosclerosis (ICD10-I70.0). These results will be called to the ordering clinician or representative by the  Radiologist Assistant, and communication documented in the PACS or [employer]. Electronically Signed   By: Misty Stanley M.D.   On: 10/11/2021 11:05    ASSESSMENT & PLAN:   Primary cancer of right upper lobe of lung ([place]) #Right upper lobe lung mass -concerning for malignancy [biopsy inconclusive/atypical cells]- Stage III-s/p carbo Abraxane with radiation [finished end of Oct 2022]. DEC 7th CT-increase in size of the Right upper lobe lung mass ~10.5 x 6.5 cm [previously around 8 x 6 cm]-along with slight increase of the mediastinal adenopathy; and surrounding infiltrative changes-pneumonitis infectious versus inflammation [see below] - STABLE.   # HOLD  IMFINZI today- [see below] Labs today reviewed [see below]; given tachycardia recommend IV fluids.  Follow steroids elevated blood sugars.  # COPD/fatigue- [coughing/phlegm/ no fevers]- on albuterol/advair [smoking]- check TSh today; add nebulizer/combivent- HOLD treatment today;refilled codeine-guaifenesin-  #Anemia-hemoglobin 8.3; likely secondary to chemotherapy; not very symptomatic.  Hold off any PRBC transfusion. ? IDA-given cecal mass.    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 Bary Castilla.  Discussed with Dr. 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   #Right chest wall pain-worsened/pleuritic-on Tylenol/tramadol [place].Fine; ]-.   #Diabetes-354-poorly controlled.  Post coffe this AM;  glipizide 5 mg twice daily; hold dex.   # IV mediport: functioning/STABLE  # PRGNOSIS: Had a long discussion with the patient and his wife regarding the overall prognosis of stage III lung cancer-25 chance of cure; immediate life expectancy is between 18 months to 24 months.  However patient has had significant interruptions/poor tolerance to  therapy-which is multifactorial [with uncontrolled diabetes; uncontrolled COPD; cancer; frequent infections etc.].  Discussed that in view of his overall prognosis/performance status-I think is reasonable to hold surgical options at this time.  Also discussed regarding hospice/hospice philosophy.  Discussed with hospice team would include-nurse, nurse aide, social worker and chaplain for help take care of patient with physical/emotional needs.  However, I would recommend reevaluation in the next few weeks-then decide on the next course of action.  Patient and wife in agreement.  *appt thru mychart  # DISPOSITION: # needs neb machine # HOLD Imfinzi today; 1 lit IVF over 1 hour  # follow up in 2 weeks-MD; -- labs- cbc/cmp;Imfinz; - -Dr.B     All questions were answered. The patient knows to call the clinic with any problems, questions or concerns.    [name], MD 11/02/2021 12:29 PM

## 2021-11-02 NOTE — Progress Notes (Signed)
Pt states his weakness is getting worse.  Having pain on the radiation side.  Productive cough X3-4 weeks.  Pt concerned with how his health has declined so fast. And was told by Dr. Tollie Pizza he only had 2 years of life expectancy.

## 2021-11-02 NOTE — Telephone Encounter (Signed)
Refill request forwarded to MD

## 2021-11-02 NOTE — Assessment & Plan Note (Addendum)
#  Right upper lobe lung mass -concerning for malignancy [biopsy inconclusive/atypical cells]- Stage III-s/p carbo Abraxane with radiation [finished end of Oct 2022]. DEC 7th CT-increase in size of the Right upper lobe lung mass ~10.5 x 6.5 cm [previously around 8 x 6 cm]-along with slight increase of the mediastinal adenopathy; and surrounding infiltrative changes-pneumonitis infectious versus inflammation [see below] - STABLE.   # HOLD  IMFINZI today- [see below] Labs today reviewed [see below]; given tachycardia recommend IV fluids.  Follow steroids elevated blood sugars.  # COPD/fatigue- [coughing/phlegm/ no fevers]- on albuterol/advair [smoking]- check TSh today; add nebulizer/combivent- HOLD treatment today;refilled codeine-guaifenesin-  #Anemia-hemoglobin 8.3; likely secondary to chemotherapy; not very symptomatic.  Hold off any PRBC transfusion. ? IDA-given cecal mass.    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 Bary Castilla.  Discussed with Dr. 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   #Right chest wall pain-worsened/pleuritic-on Tylenol/tramadol Larin.Fine; ]-.   #Diabetes-354-poorly controlled.  Post coffe this AM;  glipizide 5 mg twice daily; hold dex.   # IV mediport: functioning/STABLE  # PRGNOSIS: Had a long discussion with the patient and his wife regarding the overall prognosis of stage III lung cancer-25 chance of cure; immediate life expectancy is between 18 months to 24 months.  However patient has had significant interruptions/poor tolerance to therapy-which is multifactorial [with uncontrolled diabetes; uncontrolled COPD; cancer; frequent infections etc.].  Discussed that in view of his overall prognosis/performance status-I think is reasonable to hold surgical options at this  time.  Also discussed regarding hospice/hospice philosophy.  Discussed with hospice team would include-nurse, nurse aide, social worker and chaplain for help take care of patient with physical/emotional needs.  However, I would recommend reevaluation in the next few weeks-then decide on the next course of action.  Patient and wife in agreement.  *appt thru mychart  # DISPOSITION: # needs neb machine # HOLD Imfinzi today; 1 lit IVF over 1 hour  # follow up in 2 weeks-MD; -- labs- cbc/cmp;Imfinz; - -Dr.B

## 2021-11-03 ENCOUNTER — Telehealth: Payer: Self-pay | Admitting: *Deleted

## 2021-11-03 ENCOUNTER — Encounter: Payer: Self-pay | Admitting: Internal Medicine

## 2021-11-03 MED ORDER — ALBUTEROL SULFATE 2.5 MG/0.5ML IN NEBU: 3.0000 mL | INHALATION_SOLUTION | RESPIRATORY_TRACT | 3 refills | Status: DC | PRN

## 2021-11-03 MED ORDER — IPRATROPIUM BROMIDE 0.02 % IN SOLN: 0.5000 mg | Freq: Four times a day (QID) | RESPIRATORY_TRACT | 3 refills | Status: DC

## 2021-11-03 MED ORDER — TRAMADOL HCL 50 MG PO TABS: 100.0000 mg | ORAL_TABLET | Freq: Every evening | ORAL | 0 refills | Status: DC | PRN

## 2021-11-03 MED ORDER — ALBUTEROL SULFATE 0.63 MG/3ML IN NEBU: 1.0000 | INHALATION_SOLUTION | RESPIRATORY_TRACT | 3 refills | Status: DC | PRN

## 2021-11-03 NOTE — Telephone Encounter (Signed)
Pharmacy called needing clarification on the Albuterol and Ipratropium prescription sent stating it does not come in concentration ordered Her message was difficult to understand as connection was not good

## 2021-11-03 NOTE — Telephone Encounter (Signed)
Message left to inform of new prescriptions sent in as well appt scheduled for Friday 2/3 for IVF. Instructed to call with any further questions or needs.

## 2021-11-03 NOTE — Telephone Encounter (Signed)
Done IVF for 2/3

## 2021-11-03 NOTE — Telephone Encounter (Signed)
Spoke with pt's wife who stated that the IVF that pt received yesterday seemed to help him feel better and she is wondering if he could get more IVF if possible this week. Also, she would like to know if a blood transfusion or iron infusion might help improve his hemoglobin.   Please advise.

## 2021-11-03 NOTE — Telephone Encounter (Signed)
Rx sent.  MyChart message sent.

## 2021-11-03 NOTE — Telephone Encounter (Signed)
Pt's wife stated that duoneb nebulizer treatment is on back-order and not available at this time. Is there an alternative that you would like to send in for him?

## 2021-11-03 NOTE — Telephone Encounter (Addendum)
Patient can be scheduled for IVF only, no blood or iron recommended.  Dr. B recommends sending in separate prescriptions for albuterol and Ipratropium bromide to be used in nebulizer.  Colette, please schedule patient for IVF only this week.  Prescriptions have been sent to pharmacy.

## 2021-11-03 NOTE — Telephone Encounter (Signed)
Per pharmacy Albuterol 0.83% dose available (this dose not available in Epic).  Prescription was called in to [facility]

## 2021-11-04 ENCOUNTER — Encounter: Payer: Self-pay | Admitting: Internal Medicine

## 2021-11-05 ENCOUNTER — Telehealth: Payer: Self-pay | Admitting: *Deleted

## 2021-11-05 DIAGNOSIS — C3411 Malignant neoplasm of upper lobe, right bronchus or lung: Secondary | ICD-10-CM

## 2021-11-05 NOTE — Telephone Encounter (Signed)
Pt was able to pick up prescriptions for breathing treatments but pt does not have a nebulizer machine at home and needs it to be ordered.

## 2021-11-06 ENCOUNTER — Encounter: Payer: Self-pay | Admitting: Internal Medicine

## 2021-11-06 ENCOUNTER — Other Ambulatory Visit: Payer: Self-pay

## 2021-11-06 ENCOUNTER — Inpatient Hospital Stay: Payer: Medicare PPO | Attending: Internal Medicine

## 2021-11-06 VITALS — BP 116/62 | HR 88 | Temp 97.0°F | Resp 18 | Wt 189.0 lb

## 2021-11-06 DIAGNOSIS — E860 Dehydration: Secondary | ICD-10-CM | POA: Insufficient documentation

## 2021-11-06 DIAGNOSIS — Z87442 Personal history of urinary calculi: Secondary | ICD-10-CM | POA: Insufficient documentation

## 2021-11-06 DIAGNOSIS — Z79899 Other long term (current) drug therapy: Secondary | ICD-10-CM | POA: Diagnosis not present

## 2021-11-06 DIAGNOSIS — J449 Chronic obstructive pulmonary disease, unspecified: Secondary | ICD-10-CM | POA: Insufficient documentation

## 2021-11-06 DIAGNOSIS — C3411 Malignant neoplasm of upper lobe, right bronchus or lung: Secondary | ICD-10-CM | POA: Diagnosis present

## 2021-11-06 DIAGNOSIS — R630 Anorexia: Secondary | ICD-10-CM | POA: Insufficient documentation

## 2021-11-06 DIAGNOSIS — E119 Type 2 diabetes mellitus without complications: Secondary | ICD-10-CM | POA: Diagnosis not present

## 2021-11-06 DIAGNOSIS — I10 Essential (primary) hypertension: Secondary | ICD-10-CM | POA: Diagnosis not present

## 2021-11-06 DIAGNOSIS — M129 Arthropathy, unspecified: Secondary | ICD-10-CM | POA: Diagnosis not present

## 2021-11-06 DIAGNOSIS — R634 Abnormal weight loss: Secondary | ICD-10-CM | POA: Insufficient documentation

## 2021-11-06 DIAGNOSIS — F17210 Nicotine dependence, cigarettes, uncomplicated: Secondary | ICD-10-CM | POA: Insufficient documentation

## 2021-11-06 DIAGNOSIS — N1330 Unspecified hydronephrosis: Secondary | ICD-10-CM | POA: Diagnosis not present

## 2021-11-06 DIAGNOSIS — R531 Weakness: Secondary | ICD-10-CM | POA: Insufficient documentation

## 2021-11-06 DIAGNOSIS — D649 Anemia, unspecified: Secondary | ICD-10-CM | POA: Diagnosis not present

## 2021-11-06 DIAGNOSIS — E1165 Type 2 diabetes mellitus with hyperglycemia: Secondary | ICD-10-CM | POA: Diagnosis not present

## 2021-11-06 DIAGNOSIS — Z9221 Personal history of antineoplastic chemotherapy: Secondary | ICD-10-CM | POA: Insufficient documentation

## 2021-11-06 DIAGNOSIS — R5383 Other fatigue: Secondary | ICD-10-CM | POA: Insufficient documentation

## 2021-11-06 MED ORDER — SODIUM CHLORIDE 0.9 % IV SOLN
Freq: Once | INTRAVENOUS | Status: AC
  Filled 2021-11-06: qty 250

## 2021-11-06 MED ORDER — SODIUM CHLORIDE 0.9 % IV SOLN: Freq: Once | INTRAVENOUS | Status: DC

## 2021-11-06 MED ORDER — SODIUM CHLORIDE 0.9% FLUSH
10.0000 mL | Freq: Once | INTRAVENOUS | Status: AC | PRN
  Administered 2021-11-06: 10 mL
  Filled 2021-11-06: qty 10

## 2021-11-06 MED ORDER — HEPARIN SOD (PORK) LOCK FLUSH 100 UNIT/ML IV SOLN
500.0000 [IU] | Freq: Once | INTRAVENOUS | Status: AC | PRN
  Administered 2021-11-06: 500 [IU]
  Filled 2021-11-06: qty 5

## 2021-11-06 MED ORDER — ONDANSETRON HCL 4 MG/2ML IJ SOLN
8.0000 mg | Freq: Once | INTRAMUSCULAR | Status: AC
  Administered 2021-11-06: 8 mg via INTRAVENOUS
  Filled 2021-11-06: qty 4

## 2021-11-06 NOTE — Progress Notes (Signed)
IVF's and zofran administered today. Pt discharged to home accompanied by wife.

## 2021-11-06 NOTE — Addendum Note (Signed)
Addended by: Vanice Sarah on: 11/06/2021 10:14 AM   Modules accepted: Orders

## 2021-11-06 NOTE — Telephone Encounter (Signed)
Patient had an appointment in the clinic today and I notified Rodney Vega that an order hs been sent for Rodney Vega to supply nebulizer machine.

## 2021-11-06 NOTE — Telephone Encounter (Signed)
Order has been placed in computer for [facility] to supply (message sent to [employer] rep)

## 2021-11-09 NOTE — Telephone Encounter (Signed)
I was informed by [employer] rep that the nebulizer should be delivered to patient's home by 7 pm today (11/09/21).

## 2021-11-16 ENCOUNTER — Inpatient Hospital Stay: Payer: Medicare PPO

## 2021-11-16 ENCOUNTER — Encounter: Payer: Self-pay | Admitting: Internal Medicine

## 2021-11-16 ENCOUNTER — Inpatient Hospital Stay (HOSPITAL_BASED_OUTPATIENT_CLINIC_OR_DEPARTMENT_OTHER): Payer: Medicare PPO | Admitting: Internal Medicine

## 2021-11-16 ENCOUNTER — Other Ambulatory Visit: Payer: Self-pay

## 2021-11-16 VITALS — BP 107/61 | HR 87 | Temp 96.0°F | Resp 17 | Wt 183.8 lb

## 2021-11-16 DIAGNOSIS — C3411 Malignant neoplasm of upper lobe, right bronchus or lung: Secondary | ICD-10-CM

## 2021-11-16 DIAGNOSIS — C3490 Malignant neoplasm of unspecified part of unspecified bronchus or lung: Secondary | ICD-10-CM

## 2021-11-16 LAB — CBC WITH DIFFERENTIAL/PLATELET
Abs Immature Granulocytes: 0.06 10*3/uL (ref 0.00–0.07)
Basophils Absolute: 0.1 10*3/uL (ref 0.0–0.1)
Basophils Relative: 1 %
Eosinophils Absolute: 0.5 10*3/uL (ref 0.0–0.5)
Eosinophils Relative: 5 %
HCT: 29 % — ABNORMAL LOW (ref 39.0–52.0)
Hemoglobin: 9 g/dL — ABNORMAL LOW (ref 13.0–17.0)
Immature Granulocytes: 1 %
Lymphocytes Relative: 9 %
Lymphs Abs: 0.9 10*3/uL (ref 0.7–4.0)
MCH: 28.8 pg (ref 26.0–34.0)
MCHC: 31 g/dL (ref 30.0–36.0)
MCV: 92.9 fL (ref 80.0–100.0)
Monocytes Absolute: 0.7 10*3/uL (ref 0.1–1.0)
Monocytes Relative: 7 %
Neutro Abs: 7.4 10*3/uL (ref 1.7–7.7)
Neutrophils Relative %: 77 %
Platelets: 254 10*3/uL (ref 150–400)
RBC: 3.12 MIL/uL — ABNORMAL LOW (ref 4.22–5.81)
RDW: 17.2 % — ABNORMAL HIGH (ref 11.5–15.5)
WBC: 9.7 10*3/uL (ref 4.0–10.5)
nRBC: 0 % (ref 0.0–0.2)

## 2021-11-16 LAB — COMPREHENSIVE METABOLIC PANEL
ALT: 15 U/L (ref 0–44)
AST: 17 U/L (ref 15–41)
Albumin: 2.9 g/dL — ABNORMAL LOW (ref 3.5–5.0)
Alkaline Phosphatase: 96 U/L (ref 38–126)
Anion gap: 10 (ref 5–15)
BUN: 12 mg/dL (ref 8–23)
CO2: 26 mmol/L (ref 22–32)
Calcium: 9 mg/dL (ref 8.9–10.3)
Chloride: 101 mmol/L (ref 98–111)
Creatinine, Ser: 1.07 mg/dL (ref 0.61–1.24)
GFR, Estimated: >60 mL/min (ref >60)
Glucose, Bld: 126 mg/dL — ABNORMAL HIGH (ref 70–99)
Potassium: 3.7 mmol/L (ref 3.5–5.1)
Sodium: 137 mmol/L (ref 135–145)
Total Bilirubin: 0.2 mg/dL — ABNORMAL LOW (ref 0.3–1.2)
Total Protein: 7.5 g/dL (ref 6.5–8.1)

## 2021-11-16 LAB — IRON AND TIBC
Iron: 49 ug/dL (ref 45–182)
Saturation Ratios: 22 % (ref 17.9–39.5)
TIBC: 224 ug/dL — ABNORMAL LOW (ref 250–450)
UIBC: 175 ug/dL

## 2021-11-16 LAB — FERRITIN: Ferritin: 206 ng/mL (ref 24–336)

## 2021-11-16 MED ORDER — HEPARIN SOD (PORK) LOCK FLUSH 100 UNIT/ML IV SOLN
500.0000 [IU] | Freq: Once | INTRAVENOUS | Status: AC | PRN
  Administered 2021-11-16: 500 [IU]
  Filled 2021-11-16: qty 5

## 2021-11-16 MED ORDER — SODIUM CHLORIDE 0.9% FLUSH
10.0000 mL | Freq: Once | INTRAVENOUS | Status: AC
  Administered 2021-11-16: 10 mL via INTRAVENOUS
  Filled 2021-11-16: qty 10

## 2021-11-16 NOTE — Progress Notes (Signed)
Patient port deaccessed, no chemo today per MD. No concerns voiced, patient discharged, stable.

## 2021-11-16 NOTE — Assessment & Plan Note (Addendum)
#  Right upper lobe lung mass -concerning for malignancy [biopsy inconclusive/atypical cells]- Stage III-s/p carbo Abraxane with radiation [finished end of Oct 2022]. DEC 7th CT-increase in size of the Right upper lobe lung mass ~10.5 x 6.5 cm [previously around 8 x 6 cm]-along with slight increase of the mediastinal adenopathy; and surrounding infiltrative changes-pneumonitis infectious versus inflammation [see below] - STABLE.   # HOLD  IMFINZI today- [see below] Labs today reviewed [see below]-given overall slight improvement in performance status/symptoms since holding the treatment last week.  Await further infusions based upon patient's performance status/preference/repeat imaging.   #Anemia hemoglobin 8-9- ?  Check iron studies given history of colon mass.  Possible Venofer.  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 Bary Castilla.  Discussed with Dr. 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   #Right chest wall pain-worsened/pleuritic-on Tylenol/tramadol [Qhs-2;1 in AM - 3 a day ]-.STABLE.    #Poorly controlled diabetes-this morning 126-better controlled.  Continue glipizide.  # IV mediport: functioning/STABLE  # PRGNOSIS: I again had a long discussion the patient and his wife regarding the overall prognosis of stage III lung cancer-median  life expectancy is between 18 months to 24 months.  However patient has had significant interruptions/poor tolerance to therapy-which is multifactorial [with uncontrolled diabetes; uncontrolled COPD; cancer; frequent infections etc].  New diagnosis of colon cancer [currently intact  tumor-resection hold given his lung cancer diagnosis poor performance status]-adds a significant complexity to his care. ]  The best educated guess in terms of life expectancy would be in order of few to many months depending upon-patient's other comorbidities/progression of disease etc.  I think is reasonable to repeat a scan at this time-as previous scan was inconclusive "increase in size of the lesion"-difficult to assess if it was treatment effect versus true progression of disease.  Also discussed possible hospice option/resuming treatment.  *appt thru mychart  # DISPOSITION:de-access/ # add iron studies/feritin # HOLD Imfinzi today;  # follow up in 3 weeks-MD; -- labs- cbc/cmp; TSH-;Imfinz;CT CAP- -Dr.B

## 2021-11-16 NOTE — Progress Notes (Signed)
[facility] NOTE  Patient Care Team: [name], MD as PCP - General (Family Medicine) [name], RN as Oncology Nurse Navigator [name], MD as Consulting Physician (Urology) [name], MD as Consulting Physician (Oncology)  CHIEF COMPLAINTS/PURPOSE OF CONSULTATION: lung cancer    Oncology History Overview Note  AUG 2022- 8.2 x 5.9 cm spiculated mass noted in right upper lobe consistent with malignancy. It extends from the right hilum to the lateral chest wall and results in lytic destruction of the lateral portion of the right fourth rib.  Mediastinal lymph nodes are noted, including 12 mm right hilar lymph node, consistent with metastatic disease. 3 cm right adrenal mass is noted concerning for metastatic disease.  # AUG 2022- PET IMPRESSION: Peripherally hypermetabolic right upper lobe mass, likely due to central necrosis. Mass extends into the right hilum and right lateral chest wall involving the second through fourth right ribs.   Mildly enlarged and hypermetabolic right hilar lymph node.   No evidence of metastatic disease in the abdomen or pelvis.  # AUG, 2022-RIGHT CHEST WALL Bx-necrosis; suspicious for malignancy not definitive [discussed with Dr.Kraynie;MDT] LUNG MASS, RIGHT; BIOPSY:  - SUSPICIOUS FOR MALIGNANCY.  - SEE COMMENT.   Comment:  Approximately six tissue cores demonstrate inflamed fibrous capsule with  hemosiderin-laden macrophages, in a background of abundant necrosis.  One of the cores demonstrates a minute fragment of viable epithelial  cells.  These cells are positive for p40.  They are negative for TTF1  and CK7. The cells of interest disappear on deeper cut levels.  While  the findings are suspicious for squamous cell carcinoma, there is not  enough viable tumor for a definitive diagnosis.  Additional tissue  sampling may be helpful.   # SEP 2022-cycle #2 Taxol hypersensitive reaction.  Switched  over to #3 cycle- carbo-Abraxane starting 06/29/2021  # DEC 2022- s/p ID- Dr.ravishankar- ? Lung abscess-no antibiotics-  JAN 2023- CT scan incidental- cecal mass- colonoscopy[Dr.Russo; [facility]- high grade dysplasia]  S/p evaluation with Dr. [name] surgery for now]  # S/p evaluation with Dr. [name].   Primary cancer of right upper lobe of lung ([place])  06/03/2021 Initial Diagnosis   Primary cancer of right upper lobe of lung ([place])   06/03/2021 Cancer Staging   Staging form: Lung, AJCC 8th Edition - Clinical: Stage IIIB (cT3, cN2, cM0) - Signed by [name], MD on 06/03/2021    06/15/2021 - 08/03/2021 Chemotherapy   Patient is on Treatment Plan : LUNG Carboplatin / Paclitaxel + XRT q7d     10/02/2021 -  Chemotherapy   Patient is on Treatment Plan : LUNG Durvalumab q14d        HISTORY OF PRESENTING ILLNESS: Patient is ambulating today.  Accompanied by wife.  Rodney Vega 66 y.o.  male history of smoking -"lung cancer" [Limited necrotic tissue]-stage III unresectable currently on adjuvant IMFINZI is here for follow-up.  Since holding the treatment 2 weeks ago patient noted to have slight improvement of his overall energy levels.  However he is not back to baseline.  Continues to complain of mild to moderate pain needing tramadol up to 3 a day.  Also using nebulizer twice a day slightly improved.  Review of Systems  Constitutional:  Positive for malaise/fatigue and weight loss. Negative for chills, diaphoresis and fever.  HENT:  Negative for nosebleeds and sore throat.   Eyes:  Negative for double vision.  Respiratory:  Positive for cough, sputum production and shortness  of breath. Negative for hemoptysis and wheezing.   Cardiovascular:  Positive for chest pain. Negative for palpitations, orthopnea and leg swelling.  Gastrointestinal:  Positive for nausea. Negative for abdominal pain, blood in stool, constipation, diarrhea, heartburn, melena and vomiting.   Genitourinary:  Negative for dysuria, frequency and urgency.  Musculoskeletal:  Negative for back pain and joint pain.  Skin: Negative.  Negative for itching and rash.  Neurological:  Positive for tingling. Negative for dizziness, focal weakness, weakness and headaches.  Endo/Heme/Allergies:  Does not bruise/bleed easily.  Psychiatric/Behavioral:  Negative for depression. The patient is not nervous/anxious and does not have insomnia.     MEDICAL HISTORY:  Past Medical History:  Diagnosis Date   Cancer ([place])    Degenerative arthritis of knee    Depression    Diabetes mellitus without complication ([place])    Diverticulitis    Glaucoma    since 2004   Hernia 1979   History of kidney stones    Hypertension    Personal history of tobacco use, presenting hazards to health    Pre-diabetes    Screening for obesity    Special screening for malignant neoplasms, colon    Wears dentures    full upper and lower    SURGICAL HISTORY: Past Surgical History:  Procedure Laterality Date   [place], 2012   lumbar bulging disc   CATARACT EXTRACTION W/PHACO Left 01/20/2021   Procedure: CATARACT EXTRACTION PHACO AND INTRAOCULAR LENS PLACEMENT ([place]) LEFT;  Surgeon: Birder Robson, MD;  Location: [facility];  Service: Ophthalmology;  Laterality: Left;  10.13 0:52.1   COLONOSCOPY  9450,[id number]   [facility]/Dr. Buelah Manis sessile polyp,49m, found in rectum,multiple diverticula were found in the sigmoid, in descending and in transverse colon   COLONOSCOPY WITH PROPOFOL N/A 10/22/2021   Procedure: COLONOSCOPY WITH PROPOFOL;  Surgeon: RAnnamaria Helling DO;  [facility];  Service: Gastroenterology;  Laterality: N/A;  DM   COLOSTOMY  04-20-13   HERNIA REPAIR  1979   inguinal   IR IMAGING GUIDED PORT INSERTION  06/10/2021   JOINT REPLACEMENT Right    knee   KNEE ARTHROPLASTY Right 05/08/2018   Procedure: COMPUTER ASSISTED TOTAL KNEE ARTHROPLASTY;  Surgeon:  HDereck Leep MD;  Location: [facility];  Service: Orthopedics;  Laterality: Right;   KNEE ARTHROPLASTY Left 11/16/2019   Procedure: COMPUTER ASSISTED TOTAL KNEE ARTHROPLASTY;  Surgeon: HDereck Leep MD;  Location: [facility];  Service: Orthopedics;  Laterality: Left;   LAPAROTOMY  04-20-13   colon resection with colosotmy   LITHOTRIPSY  2013   TONSILLECTOMY AND ADENOIDECTOMY  1962    SOCIAL HISTORY: Social History   Socioeconomic History   Marital status: Married    Spouse name: Not on file   Number of children: Not on file   Years of education: Not on file   Highest education level: Not on file  Occupational History   Not on file  Tobacco Use   Smoking status: Every Day    Packs/day: 1.00    Years: 30.00    Pack years: 30.00    Types: Cigarettes   Smokeless tobacco: Never   Tobacco comments:    since age [age]or 89 Vaping Use   Vaping Use: Never used  Substance and Sexual Activity   Alcohol use: Yes    Alcohol/week: 2.0 standard drinks    Types: 2 Standard drinks or equivalent per week    Comment: 1-2 drinks per week   Drug use:  Yes    Types: Marijuana   Sexual activity: Not on file  Other Topics Concern   Not on file  Social History Narrative   15 mins [place]; smoking; not much alcohol. 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  MEDICATIONS:  Current Outpatient Medications  Medication Sig Dispense Refill    acetaminophen (TYLENOL) 500 MG tablet Take 1,000 mg by mouth daily.     albuterol (ACCUNEB) 0.63 MG/3ML nebulizer solution Take 3 mLs (0.63 mg total) by nebulization every 4 (four) hours as needed. Per pharmacy 0.83% dose available (this dose not available in Epic).  This dose was called in to [facility] 360 mL 3   albuterol (VENTOLIN HFA) 108 (90 Base) MCG/ACT inhaler Inhale 2 puffs into the lungs every 6 (six) hours as needed for wheezing or shortness of breath. 1 each 2   baclofen (LIORESAL) 10 MG tablet Take 5 mg by mouth at bedtime as needed for muscle spasms.     blood glucose meter kit and supplies KIT Check your blood glucose levels twice a day; once in the morning before breakfast; and once in the evening after dinner. 1 each 0   cetirizine (ZYRTEC) 10 MG tablet Take 10 mg by mouth daily.     Cyanocobalamin (VITAMIN B-12 PO) Take by mouth daily.     diphenhydramine-acetaminophen (TYLENOL PM) 25-500 MG TABS Take 1-2 tablets by mouth at bedtime as needed (sleep.).      dorzolamide-timolol (COSOPT) 22.3-6.8 MG/ML ophthalmic solution Place 1 drop into both eyes 2 (two) times daily.     fluticasone-salmeterol (ADVAIR DISKUS) 500-50 MCG/ACT AEPB Inhale 1 puff into the lungs in the morning and at bedtime. 1 each 3   glipiZIDE (GLUCOTROL) 5 MG tablet Take 1 tablet (5 mg total) by mouth 2 (two) times daily before a meal. 60 tablet 3   glucose blood test strip Check your blood glucose levels twice a day; once in the morning before breakfast; and once in the evening after dinner. 100 each 12   guaiFENesin-codeine 100-10 MG/5ML syrup Take 5 mLs by mouth daily as needed for cough. 120 mL 0   ipratropium (ATROVENT) 0.02 % nebulizer solution Take 2.5 mLs (0.5 mg total) by nebulization 4 (four) times daily. 360 mL 3   ipratropium-albuterol (DUONEB) 0.5-2.5 (3) MG/3ML SOLN Take 3 mLs by nebulization every 4 (four) hours as needed. 360 mL 3   Lancets (FREESTYLE) lancets Check your  blood glucose levels twice a day; once in the morning before breakfast; and once in the evening after dinner. 100 each 12   latanoprost (XALATAN) 0.005 % ophthalmic solution Place 1 drop into both eyes at bedtime.     montelukast (SINGULAIR) 10 MG tablet TAKE 1 TABLET(10 MG) BY MOUTH AT BEDTIME 90 tablet 0   nicotine (NICODERM CQ - DOSED IN MG/24 HOURS) 21 mg/24hr patch Place 1 patch (21 mg total) onto the  skin daily. 28 patch 1   olmesartan (BENICAR) 40 MG tablet Take 40 mg by mouth daily.     pregabalin (LYRICA) 50 MG capsule Take 1 capsule (50 mg total) by mouth 2 (two) times daily. 60 capsule 1   Respiratory Therapy Supplies (NEBULIZER) DEVI Use as directed 1 each 0   sucralfate (CARAFATE) 1 g tablet Take 1 tablet (1 g total) by mouth 3 (three) times daily. Dissolve in 3-4 tbsp warm water, swish and swallow 90 tablet 1   tamsulosin (FLOMAX) 0.4 MG CAPS capsule Take 0.4 mg by mouth daily.     traMADol (ULTRAM) 50 MG tablet Take 2 tablets (100 mg total) by mouth at bedtime as needed. 60 tablet 0   traZODone (DESYREL) 50 MG tablet TAKE 1 TABLET(50 MG) BY MOUTH AT BEDTIME AS NEEDED FOR SLEEP 30 tablet 1   venlafaxine XR (EFFEXOR-XR) 75 MG 24 hr capsule Take 225 mg by mouth daily.     No current facility-administered medications for this visit.   Facility-Administered Medications Ordered in Other Visits  Medication Dose Route Frequency Provider Last Rate Last Admin   heparin lock flush 100 UNIT/ML injection            heparin lock flush 100 UNIT/ML injection            heparin lock flush 100 UNIT/ML injection               .  PHYSICAL EXAMINATION: ECOG PERFORMANCE STATUS: 1 - Symptomatic but completely ambulatory  Vitals:   11/16/21 0844  BP: 107/61  Pulse: 87  Resp: 17  Temp: (!) 96 F (35.6 C)  SpO2: 98%    Filed Weights   11/16/21 0844  Weight: 183 lb 12.8 oz (83.4 kg)     Physical Exam Vitals and nursing note reviewed.  HENT:     Head: Normocephalic and  atraumatic.     Mouth/Throat:     Pharynx: Oropharynx is clear.  Eyes:     Extraocular Movements: Extraocular movements intact.     Pupils: Pupils are equal, round, and reactive to light.  Cardiovascular:     Rate and Rhythm: Normal rate and regular rhythm.  Pulmonary:     Comments: Decreased breath sounds bilaterally.  Abdominal:     Palpations: Abdomen is soft.  Musculoskeletal:        General: Normal range of motion.     Cervical back: Normal range of motion.  Skin:    General: Skin is warm.  Neurological:     General: No focal deficit present.     Mental Status: He is alert and oriented to person, place, and time.  Psychiatric:        Behavior: Behavior normal.        Judgment: Judgment normal.     LABORATORY DATA:  I have reviewed the data as listed Lab Results  Component Value Date   WBC 9.7 11/16/2021   HGB 9.0 (L) 11/16/2021   HCT 29.0 (L) 11/16/2021   MCV 92.9 11/16/2021   PLT 254 11/16/2021   Recent Labs    10/19/21 0902 11/02/21 0805 11/16/21 0827  NA 132* 136 137  K 3.5 3.5 3.7  CL 97* 100 101  CO2 _0 GLUCOSE 290* 356* 126*  BUN _1 CREATININE 0.91 0.95 1.07  CALCIUM 8.4* 8.7* 9.0  GFRNONAA >60 >60 >60  PROT 7.4 7.2 7.5  ALBUMIN 2.6* 2.5* 2.9*  AST 24 36 17  ALT  21 33 15  ALKPHOS 111 123 96  BILITOT 0.5 0.5 0.2*    RADIOGRAPHIC STUDIES: I have personally reviewed the radiological images as listed and agreed with the findings in the report. No results found.  ASSESSMENT & PLAN:   Primary cancer of right upper lobe of lung ([place]) #Right upper lobe lung mass -concerning for malignancy [biopsy inconclusive/atypical cells]- Stage III-s/p carbo Abraxane with radiation [finished end of Oct 2022]. DEC 7th CT-increase in size of the Right upper lobe lung mass ~10.5 x 6.5 cm [previously around 8 x 6 cm]-along with slight increase of the mediastinal adenopathy; and surrounding infiltrative changes-pneumonitis infectious versus inflammation  [see below] - STABLE.   # HOLD  IMFINZI today- [see below] Labs today reviewed [see below]-given overall slight improvement in performance status/symptoms since holding the treatment last week.  Await further infusions based upon patient's performance status/preference/repeat imaging.   #Anemia hemoglobin 8-9- ?  Check iron studies given history of colon mass.  Possible Venofer.  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 Bary Castilla.  Discussed with Dr. 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   #Right chest wall pain-worsened/pleuritic-on Tylenol/tramadol [Qhs-2;1 in AM - 3 a day ]-.STABLE.    #Poorly controlled diabetes-this morning 126-better controlled.  Continue glipizide.  # IV mediport: functioning/STABLE  # PRGNOSIS: I again had a long discussion the patient and his wife regarding the overall prognosis of stage III lung cancer-median  life expectancy is between 18 months to 24 months.  However patient has had significant interruptions/poor tolerance to therapy-which is multifactorial [with uncontrolled diabetes; uncontrolled COPD; cancer; frequent infections etc].  New diagnosis of colon cancer [currently intact tumor-resection hold given his lung cancer diagnosis poor performance status]-adds a significant complexity to his care. ]  The best educated guess in terms of life expectancy would be in order of few to many months depending upon-patient's other comorbidities/progression of disease etc.  I think is reasonable to repeat a scan at this time-as previous scan was inconclusive "increase in size of the lesion"-difficult  to assess if it was treatment effect versus true progression of disease.  Also discussed possible hospice option/resuming treatment.  *appt thru mychart  # DISPOSITION:de-access/ # add iron studies/feritin # HOLD Imfinzi today;  # follow up in 3 weeks-MD; -- labs- cbc/cmp; TSH-;Imfinz;CT CAP- -Dr.B     All questions were answered. The patient knows to call the clinic with any problems, questions or concerns.    [name], MD 11/16/2021 10:01 AM

## 2021-11-16 NOTE — Progress Notes (Signed)
Patient here for oncology follow-up appointment, concerns of unsteadiness and neuropathy

## 2021-11-17 ENCOUNTER — Telehealth: Payer: Self-pay | Admitting: *Deleted

## 2021-11-17 NOTE — Telephone Encounter (Signed)
Call placed to pt and pt's wife to schedule for IV fluids, but unable to reach either of them. Left message for wife to return call. Will attempt to call back.

## 2021-11-17 NOTE — Telephone Encounter (Signed)
Message received from pt's wife stating that pt is weak and would like to be scheduled for IVF this week.   Please advise.

## 2021-11-17 NOTE — Telephone Encounter (Signed)
Appointment scheduled for IV fluids. Wife aware.

## 2021-11-18 ENCOUNTER — Other Ambulatory Visit: Payer: Self-pay

## 2021-11-18 ENCOUNTER — Inpatient Hospital Stay: Payer: Medicare PPO

## 2021-11-18 ENCOUNTER — Inpatient Hospital Stay (HOSPITAL_BASED_OUTPATIENT_CLINIC_OR_DEPARTMENT_OTHER): Payer: Medicare PPO | Admitting: Hospice and Palliative Medicine

## 2021-11-18 ENCOUNTER — Encounter: Payer: Self-pay | Admitting: Internal Medicine

## 2021-11-18 VITALS — BP 108/55 | HR 83 | Temp 98.4°F | Resp 16 | Wt 181.5 lb

## 2021-11-18 DIAGNOSIS — C3411 Malignant neoplasm of upper lobe, right bronchus or lung: Secondary | ICD-10-CM

## 2021-11-18 DIAGNOSIS — Z95828 Presence of other vascular implants and grafts: Secondary | ICD-10-CM

## 2021-11-18 MED ORDER — HEPARIN SOD (PORK) LOCK FLUSH 100 UNIT/ML IV SOLN
500.0000 [IU] | Freq: Once | INTRAVENOUS | Status: AC
  Administered 2021-11-18: 500 [IU] via INTRAVENOUS
  Filled 2021-11-18: qty 5

## 2021-11-18 MED ORDER — SODIUM CHLORIDE 0.9 % IV SOLN
INTRAVENOUS | Status: AC
  Filled 2021-11-18 (×2): qty 250

## 2021-11-18 MED ORDER — SODIUM CHLORIDE 0.9% FLUSH
10.0000 mL | Freq: Once | INTRAVENOUS | Status: AC
  Administered 2021-11-18: 10 mL via INTRAVENOUS
  Filled 2021-11-18: qty 10

## 2021-11-18 MED ORDER — MIRTAZAPINE 7.5 MG PO TABS: 7.5000 mg | ORAL_TABLET | Freq: Every day | ORAL | 0 refills | Status: DC

## 2021-11-18 NOTE — Progress Notes (Addendum)
Nutrition Follow-up:  Acute add on to schedule today from [facility]  Patient with lung cancer adjuvant imfinzi currently on hold.  New diagnosis of colon cancer, hold surgery for now.    Met with patient during IV fluids. Patient reports taste alterations (salty taste) and no desire to eat are main factors effecting his ability to eat.  Patient is getting tired of the shakes (ensure).  Wife makes carnation instant breakfast mixed with milk and ice cream which he likes.  Yesterday ate 2/3rd of cheeseburger and 2 corndogs.      Medications: reviewed  Labs: reviewed  Anthropometrics:   Weight 181 lb 8 oz today  189 lb on 2/3 190 lb 1/16 189 lb 12/20 191 lb on 11/17  5% weight loss in the last 3 months, concerning  NUTRITION DIAGNOSIS: Inadequate oral intake related to taste alterations, poor appetite as evidenced by 5% weight loss in the last 3 months   INTERVENTION:  Reviewed strategies to help with taste change. Handout provided Wife wanting patient to try boost shakes.  Recommend boost plus or Boost VHC due to higher calories. NP adding remeron     MONITORING, EVALUATION, GOAL: weight trends, intake   NEXT VISIT: Tuesday, Feb 21 phone call  Akela Pocius B. Zenia Resides, [place], [place] Registered Dietitian [phone] (mobile)

## 2021-11-18 NOTE — Progress Notes (Signed)
Pt requesting IV fluids today and reports weakness and fatigue. He also endorses a poor appetite and is asking if anything can be prescribed to help increase his appetite.

## 2021-11-18 NOTE — Progress Notes (Signed)
Symptom Management [place]  Telephone:([phone] [phone]  Patient Care Team: [name], MD as PCP - General (Family Medicine) [name], RN as Oncology Nurse Navigator [name], MD as Consulting Physician (Urology) [name], MD as Consulting Physician (Oncology)   Name of the patient: Rodney Vega  [id number]  Feb 09, 1956   Date of visit: 11/18/21  Reason for Consult: Rodney Vega is a 66 year old man with multiple medical problems including stage IIIa non-small cell lung cancer status post chemotherapy and radiation.  Patient was most recently on treatment with [facility] but this has been held due to fatigue.  Patient last saw Dr. Rogue Bussing on 11/16/2021 at which time it was thought that patient performance status was slightly improved but decision was made to continue holding [place].  Patient was an add-on to [facility] today following nutrition consultation to discuss an appetite stimulant due to poor oral intake and persistent weight loss.  Patient reports that he just does not have much of an appetite or taste for food.  Denies any neurologic complaints. Denies any easy bleeding or bruising.  No fever or chills. Denies chest pain. Denies any nausea, vomiting, constipation, or diarrhea.  Patient offers no further specific complaints today.  PAST MEDICAL HISTORY: Past Medical History:  Diagnosis Date   Cancer ([place])    Degenerative arthritis of knee    Depression    Diabetes mellitus without complication ([place])    Diverticulitis    Glaucoma    since 2004   Hernia 1979   History of kidney stones    Hypertension    Personal history of tobacco use, presenting hazards to health    Pre-diabetes    Screening for obesity    Special screening for malignant neoplasms, colon    Wears dentures    full upper and lower    PAST SURGICAL HISTORY:  Past Surgical History:  Procedure Laterality Date   [place], 2012   lumbar bulging disc   CATARACT EXTRACTION W/PHACO Left 01/20/2021   Procedure: CATARACT EXTRACTION PHACO AND INTRAOCULAR LENS PLACEMENT ([place]) LEFT;  Surgeon: Birder Robson, MD;  Location: [place];  Service: Ophthalmology;  Laterality: Left;  10.13 0:52.1   COLONOSCOPY  [id number]   [facility]/Dr. Buelah Manis sessile polyp,46m, found in rectum,multiple diverticula were found in the sigmoid, in descending and in transverse colon   COLONOSCOPY WITH PROPOFOL N/A 10/22/2021   Procedure: COLONOSCOPY WITH PROPOFOL;  Surgeon: RAnnamaria Helling DO;  [facility];  Service: Gastroenterology;  Laterality: N/A;  DM   COLOSTOMY  04-20-13   HERNIA REPAIR  1979   inguinal   IR IMAGING GUIDED PORT INSERTION  06/10/2021   JOINT REPLACEMENT Right    knee   KNEE ARTHROPLASTY Right 05/08/2018   Procedure: COMPUTER ASSISTED TOTAL KNEE ARTHROPLASTY;  Surgeon: HDereck Leep MD;  Location: [facility];  Service: Orthopedics;  Laterality: Right;   KNEE ARTHROPLASTY Left 11/16/2019   Procedure: COMPUTER ASSISTED TOTAL KNEE ARTHROPLASTY;  Surgeon: HDereck Leep MD;  Location: [facility];  Service: Orthopedics;  Laterality: Left;   LAPAROTOMY  04-20-13   colon resection with colosotmy   LITHOTRIPSY  2013   TONSILLECTOMY AND ADENOIDECTOMY  1962    HEMATOLOGY/ONCOLOGY HISTORY:  Oncology History Overview Note  AUG 2022- 8.2 x 5.9 cm spiculated mass noted in right upper lobe consistent with malignancy. It extends from the right hilum to the lateral chest wall and results in lytic destruction of the  lateral portion of the right fourth rib.  Mediastinal lymph nodes are noted, including 12 mm right hilar lymph node, consistent with metastatic disease. 3 cm right adrenal mass is noted concerning for metastatic disease.  # AUG 2022- PET IMPRESSION: Peripherally hypermetabolic right upper lobe mass, likely due to central necrosis. Mass extends into the right hilum and right lateral chest  wall involving the second through fourth right ribs.   Mildly enlarged and hypermetabolic right hilar lymph node.   No evidence of metastatic disease in the abdomen or pelvis.  # AUG, 2022-RIGHT CHEST WALL Bx-necrosis; suspicious for malignancy not definitive [discussed with Dr.Kraynie;MDT] LUNG MASS, RIGHT; BIOPSY:  - SUSPICIOUS FOR MALIGNANCY.  - SEE COMMENT.   Comment:  Approximately six tissue cores demonstrate inflamed fibrous capsule with  hemosiderin-laden macrophages, in a background of abundant necrosis.  One of the cores demonstrates a minute fragment of viable epithelial  cells.  These cells are positive for p40.  They are negative for TTF1  and CK7. The cells of interest disappear on deeper cut levels.  While  the findings are suspicious for squamous cell carcinoma, there is not  enough viable tumor for a definitive diagnosis.  Additional tissue  sampling may be helpful.   # SEP 2022-cycle #2 Taxol hypersensitive reaction.  Switched over to #3 cycle- carbo-Abraxane starting 06/29/2021  # DEC 2022- s/p ID- Dr.ravishankar- ? Lung abscess-no antibiotics-  JAN 2023- CT scan incidental- cecal mass- colonoscopy[Dr.Russo; [facility]- high grade dysplasia]  S/p evaluation with Dr. [name] surgery for now]  # S/p evaluation with Dr. [name].   Primary cancer of right upper lobe of lung ([name])  06/03/2021 Initial Diagnosis   Primary cancer of right upper lobe of lung ([place])   06/03/2021 Cancer Staging   Staging form: Lung, AJCC 8th Edition - Clinical: Stage IIIB (cT3, cN2, cM0) - Signed by [name], MD on 06/03/2021   06/15/2021 - 08/03/2021 Chemotherapy   Patient is on Treatment Plan : LUNG Carboplatin / Paclitaxel + XRT q7d     10/02/2021 -  Chemotherapy   Patient is on Treatment Plan : LUNG Durvalumab q14d       ALLERGIES:  is allergic to paclitaxel, ambien [zolpidem], nsaids, and aleve [naproxen sodium].  MEDICATIONS:  Current Outpatient Medications   Medication Sig Dispense Refill   mirtazapine (REMERON) 7.5 MG tablet Take 1 tablet (7.5 mg total) by mouth at bedtime. 30 tablet 0   acetaminophen (TYLENOL) 500 MG tablet Take 1,000 mg by mouth daily.     albuterol (ACCUNEB) 0.63 MG/3ML nebulizer solution Take 3 mLs (0.63 mg total) by nebulization every 4 (four) hours as needed. Per pharmacy 0.83% dose available (this dose not available in Epic).  This dose was called in to [employer] [facility] 360 mL 3   albuterol (VENTOLIN HFA) 108 (90 Base) MCG/ACT inhaler Inhale 2 puffs into the lungs every 6 (six) hours as needed for wheezing or shortness of breath. 1 each 2   baclofen (LIORESAL) 10 MG tablet Take 5 mg by mouth at bedtime as needed for muscle spasms.     blood glucose meter kit and supplies KIT Check your blood glucose levels twice a day; once in the morning before breakfast; and once in the evening after dinner. 1 each 0   cetirizine (ZYRTEC) 10 MG tablet Take 10 mg by mouth daily.     Cyanocobalamin (VITAMIN B-12 PO) Take by mouth daily.     diphenhydramine-acetaminophen (TYLENOL PM) 25-500 MG TABS Take 1-2 tablets  by mouth at bedtime as needed (sleep.).      dorzolamide-timolol (COSOPT) 22.3-6.8 MG/ML ophthalmic solution Place 1 drop into both eyes 2 (two) times daily.     fluticasone-salmeterol (ADVAIR DISKUS) 500-50 MCG/ACT AEPB Inhale 1 puff into the lungs in the morning and at bedtime. 1 each 3   glipiZIDE (GLUCOTROL) 5 MG tablet Take 1 tablet (5 mg total) by mouth 2 (two) times daily before a meal. 60 tablet 3   glucose blood test strip Check your blood glucose levels twice a day; once in the morning before breakfast; and once in the evening after dinner. 100 each 12   guaiFENesin-codeine 100-10 MG/5ML syrup Take 5 mLs by mouth daily as needed for cough. 120 mL 0   ipratropium (ATROVENT) 0.02 % nebulizer solution Take 2.5 mLs (0.5 mg total) by nebulization 4 (four) times daily. 360 mL 3   ipratropium-albuterol (DUONEB) 0.5-2.5 (3) MG/3ML  SOLN Take 3 mLs by nebulization every 4 (four) hours as needed. 360 mL 3   Lancets (FREESTYLE) lancets Check your blood glucose levels twice a day; once in the morning before breakfast; and once in the evening after dinner. 100 each 12   latanoprost (XALATAN) 0.005 % ophthalmic solution Place 1 drop into both eyes at bedtime.     montelukast (SINGULAIR) 10 MG tablet TAKE 1 TABLET(10 MG) BY MOUTH AT BEDTIME 90 tablet 0   nicotine (NICODERM CQ - DOSED IN MG/24 HOURS) 21 mg/24hr patch Place 1 patch (21 mg total) onto the skin daily. 28 patch 1   olmesartan (BENICAR) 40 MG tablet Take 40 mg by mouth daily.     pregabalin (LYRICA) 50 MG capsule Take 1 capsule (50 mg total) by mouth 2 (two) times daily. 60 capsule 1   Respiratory Therapy Supplies (NEBULIZER) DEVI Use as directed 1 each 0   sucralfate (CARAFATE) 1 g tablet Take 1 tablet (1 g total) by mouth 3 (three) times daily. Dissolve in 3-4 tbsp warm water, swish and swallow 90 tablet 1   tamsulosin (FLOMAX) 0.4 MG CAPS capsule Take 0.4 mg by mouth daily.     traMADol (ULTRAM) 50 MG tablet Take 2 tablets (100 mg total) by mouth at bedtime as needed. 60 tablet 0   venlafaxine XR (EFFEXOR-XR) 75 MG 24 hr capsule Take 225 mg by mouth daily.     No current facility-administered medications for this visit.   Facility-Administered Medications Ordered in Other Visits  Medication Dose Route Frequency Provider Last Rate Last Admin   heparin lock flush 100 UNIT/ML injection            heparin lock flush 100 UNIT/ML injection            heparin lock flush 100 UNIT/ML injection             VITAL SIGNS: There were no vitals taken for this visit. There were no vitals filed for this visit.  Estimated body mass index is 25.31 kg/m as calculated from the following:   Height as of 11/02/21: _0  (1.803 m).   Weight as of an earlier encounter on 11/18/21: 181 lb 8 oz (82.3 kg).  LABS: CBC:    Component Value Date/Time   WBC 9.7 11/16/2021 0827   HGB  9.0 (L) 11/16/2021 0827   HGB 12.5 (L) 09/21/2013 0522   HCT 29.0 (L) 11/16/2021 0827   HCT 36.3 (L) 09/21/2013 0522   PLT 254 11/16/2021 0827   PLT 185 09/21/2013 0522   MCV 92.9 11/16/2021  0827   MCV 91 09/21/2013 0522   NEUTROABS 7.4 11/16/2021 0827   NEUTROABS 11.3 (H) 09/21/2013 0522   LYMPHSABS 0.9 11/16/2021 0827   LYMPHSABS 1.4 09/21/2013 0522   MONOABS 0.7 11/16/2021 0827   MONOABS 0.9 09/21/2013 0522   EOSABS 0.5 11/16/2021 0827   EOSABS 0.0 09/21/2013 0522   BASOSABS 0.1 11/16/2021 0827   BASOSABS 0.1 09/21/2013 0522   Comprehensive Metabolic Panel:    Component Value Date/Time   NA 137 11/16/2021 0827   NA 138 09/20/2013 0639   K 3.7 11/16/2021 0827   K 4.0 09/20/2013 0639   CL 101 11/16/2021 0827   CL 107 09/20/2013 0639   CO2 26 11/16/2021 0827   CO2 26 09/20/2013 0639   BUN 12 11/16/2021 0827   BUN 12 09/20/2013 0639   CREATININE 1.07 11/16/2021 0827   CREATININE 0.84 09/24/2013 0515   GLUCOSE 126 (H) 11/16/2021 0827   GLUCOSE 160 (H) 09/20/2013 0639   CALCIUM 9.0 11/16/2021 0827   CALCIUM 8.2 (L) 09/20/2013 0639   AST 17 11/16/2021 0827   AST 22 01/08/2012 0939   ALT 15 11/16/2021 0827   ALT 20 01/08/2012 0939   ALKPHOS 96 11/16/2021 0827   ALKPHOS 69 01/08/2012 0939   BILITOT 0.2 (L) 11/16/2021 0827   BILITOT 1.1 (H) 01/08/2012 0939   PROT 7.5 11/16/2021 0827   PROT 6.5 09/12/2013 1002   PROT 6.9 01/08/2012 0939   ALBUMIN 2.9 (L) 11/16/2021 0827   ALBUMIN 4.4 09/12/2013 1002   ALBUMIN 3.5 01/08/2012 0939    RADIOGRAPHIC STUDIES: No results found.  PERFORMANCE STATUS (ECOG) : 1 - Symptomatic but completely ambulatory  Review of Systems Unless otherwise noted, a complete review of systems is negative.  Physical Exam General: NAD Pulmonary: Unlabored Extremities: no edema, no joint deformities Skin: no rashes Neurological: Grossly nonfocal  Assessment and Plan- Patient is a 66 y.o. male  with multiple medical problems including stage  IIIa non-small cell lung cancer status post chemotherapy and radiation.  Patient presents to [facility] today to discuss starting an appetite stimulant   Poor appetite/weight loss-patient has been followed by nutrition.  Recommended high-calorie/high-protein foods.  I would also recommend starting him on oral nutritional supplements to 3 times a day as it may be easier for him to drink calories not eaten.  They are not great appetite stimulants for him as steroids are not ideal given his diabetes.  Megace could potentially increase clotting risk.  I did discuss option of Marinol but wife was concerned regarding the expense.  We will plan to start him on low-dose mirtazapine while monitoring serotonergic effects of Effexor/tramadol.  I would not recommend increasing dose of mirtazapine any higher than 7.5 mg nightly.  If no improvement after 2-week trial, can discontinue mirtazapine.  Case and plan discussed with Dr. Rogue Bussing   Patient expressed understanding and was in agreement with this plan. He also understands that He can call clinic at any time with any questions, concerns, or complaints.   Thank you for allowing me to participate in the care of this very pleasant patient.   Time Total: 21 minutes  Visit consisted of counseling and education dealing with the complex and emotionally intense issues of symptom management in the setting of serious illness.Greater than 50%  of this time was spent counseling and coordinating care related to the above assessment and plan.  Signed by: Altha Harm, PhD, NP-C

## 2021-11-20 ENCOUNTER — Telehealth: Payer: Self-pay | Admitting: *Deleted

## 2021-11-20 NOTE — Telephone Encounter (Signed)
Spoke to patient's wife; patient currently clinically stable, but c/o fatigue.   Recommend increased p.o. fluids over the weekend.  If still feeling weak/not improved would recommend evaluation with symptomatic med clinic on Monday- 2/20- Dr.B

## 2021-11-20 NOTE — Telephone Encounter (Signed)
Message from wife that patient has been doing well until this afternoon, be bent over to get something while sitting in his chair and got weak, fell out of chair and then started shaking. His BS was 107 post eating, she gave him 2 hershey kisses and his BS was 116. His b/p was 110/60. She is asking what to do. Please advise

## 2021-11-23 ENCOUNTER — Telehealth: Payer: Self-pay | Admitting: *Deleted

## 2021-11-23 NOTE — Telephone Encounter (Signed)
Wife called stating that patient needs IV fluids again and to call her ASAP regarding this

## 2021-11-23 NOTE — Telephone Encounter (Signed)
Contacted wife and offered appointment for IV fluids tomorrow. Wife in agreement. Appointment scheduled.

## 2021-11-24 ENCOUNTER — Other Ambulatory Visit: Payer: Self-pay

## 2021-11-24 ENCOUNTER — Inpatient Hospital Stay: Payer: Medicare PPO

## 2021-11-24 ENCOUNTER — Inpatient Hospital Stay (HOSPITAL_BASED_OUTPATIENT_CLINIC_OR_DEPARTMENT_OTHER): Payer: Medicare PPO | Admitting: Hospice and Palliative Medicine

## 2021-11-24 VITALS — BP 105/64 | HR 88 | Temp 98.7°F | Resp 14

## 2021-11-24 DIAGNOSIS — C3411 Malignant neoplasm of upper lobe, right bronchus or lung: Secondary | ICD-10-CM

## 2021-11-24 DIAGNOSIS — Z95828 Presence of other vascular implants and grafts: Secondary | ICD-10-CM

## 2021-11-24 LAB — CBC WITH DIFFERENTIAL/PLATELET
Abs Immature Granulocytes: 0.07 10*3/uL (ref 0.00–0.07)
Basophils Absolute: 0 10*3/uL (ref 0.0–0.1)
Basophils Relative: 0 %
Eosinophils Absolute: 0.4 10*3/uL (ref 0.0–0.5)
Eosinophils Relative: 3 %
HCT: 27 % — ABNORMAL LOW (ref 39.0–52.0)
Hemoglobin: 8.5 g/dL — ABNORMAL LOW (ref 13.0–17.0)
Immature Granulocytes: 1 %
Lymphocytes Relative: 7 %
Lymphs Abs: 0.7 10*3/uL (ref 0.7–4.0)
MCH: 28.2 pg (ref 26.0–34.0)
MCHC: 31.5 g/dL (ref 30.0–36.0)
MCV: 89.7 fL (ref 80.0–100.0)
Monocytes Absolute: 0.7 10*3/uL (ref 0.1–1.0)
Monocytes Relative: 7 %
Neutro Abs: 9.5 10*3/uL — ABNORMAL HIGH (ref 1.7–7.7)
Neutrophils Relative %: 82 %
Platelets: 192 10*3/uL (ref 150–400)
RBC: 3.01 MIL/uL — ABNORMAL LOW (ref 4.22–5.81)
RDW: 17.2 % — ABNORMAL HIGH (ref 11.5–15.5)
WBC: 11.5 10*3/uL — ABNORMAL HIGH (ref 4.0–10.5)
nRBC: 0 % (ref 0.0–0.2)

## 2021-11-24 LAB — COMPREHENSIVE METABOLIC PANEL
ALT: 13 U/L (ref 0–44)
AST: 18 U/L (ref 15–41)
Albumin: 2.8 g/dL — ABNORMAL LOW (ref 3.5–5.0)
Alkaline Phosphatase: 102 U/L (ref 38–126)
Anion gap: 9 (ref 5–15)
BUN: 11 mg/dL (ref 8–23)
CO2: 26 mmol/L (ref 22–32)
Calcium: 8.5 mg/dL — ABNORMAL LOW (ref 8.9–10.3)
Chloride: 97 mmol/L — ABNORMAL LOW (ref 98–111)
Creatinine, Ser: 0.89 mg/dL (ref 0.61–1.24)
GFR, Estimated: >60 mL/min (ref >60)
Glucose, Bld: 247 mg/dL — ABNORMAL HIGH (ref 70–99)
Potassium: 3.2 mmol/L — ABNORMAL LOW (ref 3.5–5.1)
Sodium: 132 mmol/L — ABNORMAL LOW (ref 135–145)
Total Bilirubin: 0.3 mg/dL (ref 0.3–1.2)
Total Protein: 7.2 g/dL (ref 6.5–8.1)

## 2021-11-24 MED ORDER — SODIUM CHLORIDE 0.9 % IV SOLN
INTRAVENOUS | Status: AC
  Filled 2021-11-24: qty 250

## 2021-11-24 MED ORDER — SODIUM CHLORIDE 0.9% FLUSH
10.0000 mL | Freq: Once | INTRAVENOUS | Status: AC
  Administered 2021-11-24: 10 mL via INTRAVENOUS
  Filled 2021-11-24: qty 10

## 2021-11-24 MED ORDER — POTASSIUM CHLORIDE 20 MEQ/100ML IV SOLN
20.0000 meq | Freq: Once | INTRAVENOUS | Status: AC
  Administered 2021-11-24: 20 meq via INTRAVENOUS

## 2021-11-24 MED ORDER — HEPARIN SOD (PORK) LOCK FLUSH 100 UNIT/ML IV SOLN
500.0000 [IU] | Freq: Once | INTRAVENOUS | Status: AC
  Administered 2021-11-24: 500 [IU] via INTRAVENOUS
  Filled 2021-11-24: qty 5

## 2021-11-24 NOTE — Progress Notes (Signed)
Nutrition Follow-up:  Patient with lung cancer, imfinzi currently on hold.  New diagnosis of colon cancer, hold surgery for now.    Met with patient and wife during fluids.  Patient ate grilled cheese sandwich and 3 bites of rice per wife yesterday.  Overall intake still decreased.  Taste alterations are present effecting intake.  Daughter ordered Boost VHC shakes but has not got them yet.  Has tried protein bars and liquid IV for hydration.     Medications: reviewed  Labs: reviewed  Anthropometrics:   Weight 184 lb today  181 lb 8 oz on 2/15 189 lb on 2/3 190 lb on 1/16 189 lb on 12/20 191 lb on 11/17    NUTRITION DIAGNOSIS: Inadequate oral intake continues   INTERVENTION:  Encouraged nibbling/sipping q hour. Samples of [employer] 1.4 and Reason shake, ensure rapid hydration given to patient try.   Continue appetite stimulant    MONITORING, EVALUATION, GOAL: weight trends, intake   NEXT VISIT: Wednesday, March 8 phone call  Cloteal Isaacson B. Zenia Resides, [place], [place] Registered Dietitian [phone] (mobile)

## 2021-11-24 NOTE — Patient Instructions (Signed)

## 2021-11-24 NOTE — Progress Notes (Signed)
Symptom Management [place]  [phone] [phone]  Patient Care Team: [name], MD as PCP - General (Family Medicine) [name], RN as Oncology Nurse Navigator [name], MD as Consulting Physician (Urology) [name], MD as Consulting Physician (Oncology)   Name of the patient: Rodney Vega  [id number]  Jan 11, 1956   Date of visit: 11/24/21  Reason for Consult: Rodney Vega is a 66 year old man with multiple medical problems including stage IIIa non-small cell lung cancer status post chemotherapy and radiation.  Patient was most recently on treatment with [facility] but this has been held due to fatigue.  Patient last saw Dr. Rogue Bussing on 11/16/2021 at which time it was thought that patient performance status was slightly improved but decision was made to continue holding [place].  Patient was seen in [facility] on 11/18/2021 with poor oral intake and was started on mirtazapine as an appetite stimulant.  He was an add-on to [facility] today for evaluation of positional dizziness.  Patient describes periods of dizziness, tremulousness, and worsening weakness when he changes positions quickly from sitting to standing.  He had a recent subsequent fall.  Appetite is reportedly still poor and patient is really not drinking much during the day.  Patient reports that he just does not have much of an appetite or taste for food.  Denies any neurologic complaints. Denies any easy bleeding or bruising.  No fever or chills. Denies chest pain. Denies any nausea, vomiting, constipation, or diarrhea.  Patient offers no further specific complaints today.  PAST MEDICAL HISTORY: Past Medical History:  Diagnosis Date   Cancer ([place])    Degenerative arthritis of knee    Depression    Diabetes mellitus without complication ([place])    Diverticulitis    Glaucoma    since 2004   Hernia 1979   History of kidney stones    Hypertension     Personal history of tobacco use, presenting hazards to health    Pre-diabetes    Screening for obesity    Special screening for malignant neoplasms, colon    Wears dentures    full upper and lower    PAST SURGICAL HISTORY:  Past Surgical History:  Procedure Laterality Date   [place], 2012   lumbar bulging disc   CATARACT EXTRACTION W/PHACO Left 01/20/2021   Procedure: CATARACT EXTRACTION PHACO AND INTRAOCULAR LENS PLACEMENT ([place]) LEFT;  Surgeon: Birder Robson, MD;  Location: [place];  Service: Ophthalmology;  Laterality: Left;  10.13 0:52.1   COLONOSCOPY  4825,0037   [facility]/Dr. Buelah Manis sessile polyp,28m, found in rectum,multiple diverticula were found in the sigmoid, in descending and in transverse colon   COLONOSCOPY WITH PROPOFOL N/A 10/22/2021   Procedure: COLONOSCOPY WITH PROPOFOL;  Surgeon: RAnnamaria Helling DO;  [facility];  Service: Gastroenterology;  Laterality: N/A;  DM   COLOSTOMY  04-20-13   HERNIA REPAIR  1979   inguinal   IR IMAGING GUIDED PORT INSERTION  06/10/2021   JOINT REPLACEMENT Right    knee   KNEE ARTHROPLASTY Right 05/08/2018   Procedure: COMPUTER ASSISTED TOTAL KNEE ARTHROPLASTY;  Surgeon: HDereck Leep MD;  Location: [facility]S;  Service: Orthopedics;  Laterality: Right;   KNEE ARTHROPLASTY Left 11/16/2019   Procedure: COMPUTER ASSISTED TOTAL KNEE ARTHROPLASTY;  Surgeon: HDereck Leep MD;  Location: [facility] OR[facility];  Service: Orthopedics;  Laterality: Left;   LAPAROTOMY  04-20-13   colon resection with colosotmy   LITHOTRIPSY  2013  TONSILLECTOMY AND ADENOIDECTOMY  1962    HEMATOLOGY/ONCOLOGY HISTORY:  Oncology History Overview Note  AUG 2022- 8.2 x 5.9 cm spiculated mass noted in right upper lobe consistent with malignancy. It extends from the right hilum to the lateral chest wall and results in lytic destruction of the lateral portion of the right fourth rib.  Mediastinal lymph nodes are noted, including 12 mm right  hilar lymph node, consistent with metastatic disease. 3 cm right adrenal mass is noted concerning for metastatic disease.  # AUG 2022- PET IMPRESSION: Peripherally hypermetabolic right upper lobe mass, likely due to central necrosis. Mass extends into the right hilum and right lateral chest wall involving the second through fourth right ribs.   Mildly enlarged and hypermetabolic right hilar lymph node.   No evidence of metastatic disease in the abdomen or pelvis.  # AUG, 2022-RIGHT CHEST WALL Bx-necrosis; suspicious for malignancy not definitive [discussed with Dr.Kraynie;MDT] LUNG MASS, RIGHT; BIOPSY:  - SUSPICIOUS FOR MALIGNANCY.  - SEE COMMENT.   Comment:  Approximately six tissue cores demonstrate inflamed fibrous capsule with  hemosiderin-laden macrophages, in a background of abundant necrosis.  One of the cores demonstrates a minute fragment of viable epithelial  cells.  These cells are positive for p40.  They are negative for TTF1  and CK7. The cells of interest disappear on deeper cut levels.  While  the findings are suspicious for squamous cell carcinoma, there is not  enough viable tumor for a definitive diagnosis.  Additional tissue  sampling may be helpful.   # SEP 2022-cycle #2 Taxol hypersensitive reaction.  Switched over to #3 cycle- carbo-Abraxane starting 06/29/2021  # DEC 2022- s/p ID- Dr.ravishankar- ? Lung abscess-no antibiotics-  JAN 2023- CT scan incidental- cecal mass- colonoscopy[Dr.Russo; [facility]- high grade dysplasia]  S/p evaluation with Dr. [name] surgery for now]  # S/p evaluation with Dr. [name].   Primary cancer of right upper lobe of lung ([place])  06/03/2021 Initial Diagnosis   Primary cancer of right upper lobe of lung ([place])   06/03/2021 Cancer Staging   Staging form: Lung, AJCC 8th Edition - Clinical: Stage IIIB (cT3, cN2, cM0) - Signed by [name], MD on 06/03/2021   06/15/2021 - 08/03/2021 Chemotherapy   Patient is on  Treatment Plan : LUNG Carboplatin / Paclitaxel + XRT q7d     10/02/2021 -  Chemotherapy   Patient is on Treatment Plan : LUNG Durvalumab q14d       ALLERGIES:  is allergic to paclitaxel, ambien [zolpidem], nsaids, and aleve [naproxen sodium].  MEDICATIONS:  Current Outpatient Medications  Medication Sig Dispense Refill   acetaminophen (TYLENOL) 500 MG tablet Take 1,000 mg by mouth daily.     albuterol (ACCUNEB) 0.63 MG/3ML nebulizer solution Take 3 mLs (0.63 mg total) by nebulization every 4 (four) hours as needed. Per pharmacy 0.83% dose available (this dose not available in Epic).  This dose was called in to [employer] 360 mL 3   albuterol (VENTOLIN HFA) 108 (90 Base) MCG/ACT inhaler Inhale 2 puffs into the lungs every 6 (six) hours as needed for wheezing or shortness of breath. 1 each 2   baclofen (LIORESAL) 10 MG tablet Take 5 mg by mouth at bedtime as needed for muscle spasms.     blood glucose meter kit and supplies KIT Check your blood glucose levels twice a day; once in the morning before breakfast; and once in the evening after dinner. 1 each 0   cetirizine (ZYRTEC) 10 MG tablet Take  10 mg by mouth daily.     Cyanocobalamin (VITAMIN B-12 PO) Take by mouth daily.     diphenhydramine-acetaminophen (TYLENOL PM) 25-500 MG TABS Take 1-2 tablets by mouth at bedtime as needed (sleep.).      dorzolamide-timolol (COSOPT) 22.3-6.8 MG/ML ophthalmic solution Place 1 drop into both eyes 2 (two) times daily.     fluticasone-salmeterol (ADVAIR DISKUS) 500-50 MCG/ACT AEPB Inhale 1 puff into the lungs in the morning and at bedtime. 1 each 3   glipiZIDE (GLUCOTROL) 5 MG tablet Take 1 tablet (5 mg total) by mouth 2 (two) times daily before a meal. 60 tablet 3   glucose blood test strip Check your blood glucose levels twice a day; once in the morning before breakfast; and once in the evening after dinner. 100 each 12   guaiFENesin-codeine 100-10 MG/5ML syrup Take 5 mLs by mouth daily as needed for  cough. 120 mL 0   ipratropium (ATROVENT) 0.02 % nebulizer solution Take 2.5 mLs (0.5 mg total) by nebulization 4 (four) times daily. 360 mL 3   ipratropium-albuterol (DUONEB) 0.5-2.5 (3) MG/3ML SOLN Take 3 mLs by nebulization every 4 (four) hours as needed. 360 mL 3   Lancets (FREESTYLE) lancets Check your blood glucose levels twice a day; once in the morning before breakfast; and once in the evening after dinner. 100 each 12   latanoprost (XALATAN) 0.005 % ophthalmic solution Place 1 drop into both eyes at bedtime.     mirtazapine (REMERON) 7.5 MG tablet Take 1 tablet (7.5 mg total) by mouth at bedtime. 30 tablet 0   montelukast (SINGULAIR) 10 MG tablet TAKE 1 TABLET(10 MG) BY MOUTH AT BEDTIME 90 tablet 0   nicotine (NICODERM CQ - DOSED IN MG/24 HOURS) 21 mg/24hr patch Place 1 patch (21 mg total) onto the skin daily. 28 patch 1   olmesartan (BENICAR) 40 MG tablet Take 40 mg by mouth daily.     pregabalin (LYRICA) 50 MG capsule Take 1 capsule (50 mg total) by mouth 2 (two) times daily. 60 capsule 1   Respiratory Therapy Supplies (NEBULIZER) DEVI Use as directed 1 each 0   sucralfate (CARAFATE) 1 g tablet Take 1 tablet (1 g total) by mouth 3 (three) times daily. Dissolve in 3-4 tbsp warm water, swish and swallow 90 tablet 1   tamsulosin (FLOMAX) 0.4 MG CAPS capsule Take 0.4 mg by mouth daily.     traMADol (ULTRAM) 50 MG tablet Take 2 tablets (100 mg total) by mouth at bedtime as needed. 60 tablet 0   venlafaxine XR (EFFEXOR-XR) 75 MG 24 hr capsule Take 225 mg by mouth daily.     No current facility-administered medications for this visit.   Facility-Administered Medications Ordered in Other Visits  Medication Dose Route Frequency Provider Last Rate Last Admin   0.9 %  sodium chloride infusion   Intravenous Continuous Tiauna Whisnant, Vonna Kotyk R, NP       heparin lock flush 100 UNIT/ML injection            heparin lock flush 100 UNIT/ML injection            heparin lock flush 100 UNIT/ML injection             potassium chloride 20 mEq in 100 mL IVPB  20 mEq Intravenous Once Kimerly Rowand R, NP 100 mL/hr at 11/24/21 1056 20 mEq at 11/24/21 1056    VITAL SIGNS: BP 105/64    Pulse 88    Temp 98.7 F (37.1 C) (Tympanic)  Resp 14    SpO2 97%  There were no vitals filed for this visit.  Estimated body mass index is 25.66 kg/m as calculated from the following:   Height as of 11/02/21: _0  (1.803 m).   Weight as of an earlier encounter on 11/24/21: 184 lb (83.5 kg).  LABS: CBC:    Component Value Date/Time   WBC 11.5 (H) 11/24/2021 1005   HGB 8.5 (L) 11/24/2021 1005   HGB 12.5 (L) 09/21/2013 0522   HCT 27.0 (L) 11/24/2021 1005   HCT 36.3 (L) 09/21/2013 0522   PLT 192 11/24/2021 1005   PLT 185 09/21/2013 0522   MCV 89.7 11/24/2021 1005   MCV 91 09/21/2013 0522   NEUTROABS 9.5 (H) 11/24/2021 1005   NEUTROABS 11.3 (H) 09/21/2013 0522   LYMPHSABS 0.7 11/24/2021 1005   LYMPHSABS 1.4 09/21/2013 0522   MONOABS 0.7 11/24/2021 1005   MONOABS 0.9 09/21/2013 0522   EOSABS 0.4 11/24/2021 1005   EOSABS 0.0 09/21/2013 0522   BASOSABS 0.0 11/24/2021 1005   BASOSABS 0.1 09/21/2013 0522   Comprehensive Metabolic Panel:    Component Value Date/Time   NA 132 (L) 11/24/2021 1005   NA 138 09/20/2013 0639   K 3.2 (L) 11/24/2021 1005   K 4.0 09/20/2013 0639   CL 97 (L) 11/24/2021 1005   CL 107 09/20/2013 0639   CO2 26 11/24/2021 1005   CO2 26 09/20/2013 0639   BUN 11 11/24/2021 1005   BUN 12 09/20/2013 0639   CREATININE 0.89 11/24/2021 1005   CREATININE 0.84 09/24/2013 0515   GLUCOSE 247 (H) 11/24/2021 1005   GLUCOSE 160 (H) 09/20/2013 0639   CALCIUM 8.5 (L) 11/24/2021 1005   CALCIUM 8.2 (L) 09/20/2013 0639   AST 18 11/24/2021 1005   AST 22 01/08/2012 0939   ALT 13 11/24/2021 1005   ALT 20 01/08/2012 0939   ALKPHOS 102 11/24/2021 1005   ALKPHOS 69 01/08/2012 0939   BILITOT 0.3 11/24/2021 1005   BILITOT 1.1 (H) 01/08/2012 0939   PROT 7.2 11/24/2021 1005   PROT 6.5 09/12/2013 1002    PROT 6.9 01/08/2012 0939   ALBUMIN 2.8 (L) 11/24/2021 1005   ALBUMIN 4.4 09/12/2013 1002   ALBUMIN 3.5 01/08/2012 0939    RADIOGRAPHIC STUDIES: No results found.  PERFORMANCE STATUS (ECOG) : 1 - Symptomatic but completely ambulatory  Review of Systems Unless otherwise noted, a complete review of systems is negative.  Physical Exam General: NAD Cardiac: RRR Pulmonary: Chronically coarse to auscultation anterior/posterior fields Abdomen: Soft, nontender to palp Extremities: no edema, no joint deformities Skin: no rashes Neurological: Grossly nonfocal  Assessment and Plan- Patient is a 66 y.o. male  with multiple medical problems including stage IIIa non-small cell lung cancer status post chemotherapy and radiation.  Patient presents to [facility] today to discuss positional weakness/dizziness   Weakness/dizziness-patient denies any symptoms at present but reports they are frequently occurring with position changes such as sitting to standing.  Suspect orthostasis in the setting of poor oral intake.  Labs are consistent with dehydration/mild hypokalemia.  We will give IV fluids and replete K today.  We will plan to bring patient back later this week for repeat labs/fluid clinic.  Encouraged p.o. fluids/habitual sipping.  We will also send referral for home health PT.  Case and plan discussed with Dr. Rogue Bussing   Patient expressed understanding and was in agreement with this plan. He also understands that He can call clinic at any time with any questions, concerns, or  complaints.   Thank you for allowing me to participate in the care of this very pleasant patient.   Time Total: 20 minutes  Visit consisted of counseling and education dealing with the complex and emotionally intense issues of symptom management in the setting of serious illness.Greater than 50%  of this time was spent counseling and coordinating care related to the above assessment and plan.  Signed by: Altha Harm,  PhD, NP-C

## 2021-11-24 NOTE — Progress Notes (Signed)
Pt added to [facility]. Wife states that patient has been having "episodes" where he gets shaky and weak and looks "glassy eyed". Wife states "its almost like he's having a seizure". She reports that pt fell out of his chair Friday and stated "he almost fell out yesterday". Pt does not check his blood sugars on a routine schedule, but wife reports that she occasionally checks it after a meal.

## 2021-11-25 ENCOUNTER — Telehealth: Payer: Self-pay | Admitting: *Deleted

## 2021-11-25 NOTE — Telephone Encounter (Signed)
Peggie called stating that patient is still very weak and would like to speak to [facility] about this

## 2021-11-25 NOTE — Telephone Encounter (Signed)
I spoke with patient's wife. Will bring him in tomorrow for labs/fluids

## 2021-11-26 ENCOUNTER — Other Ambulatory Visit: Payer: Self-pay

## 2021-11-26 ENCOUNTER — Inpatient Hospital Stay (HOSPITAL_BASED_OUTPATIENT_CLINIC_OR_DEPARTMENT_OTHER): Payer: Medicare PPO | Admitting: Hospice and Palliative Medicine

## 2021-11-26 ENCOUNTER — Inpatient Hospital Stay: Payer: Medicare PPO

## 2021-11-26 VITALS — BP 85/60 | HR 115 | Temp 97.8°F | Resp 16

## 2021-11-26 DIAGNOSIS — E860 Dehydration: Secondary | ICD-10-CM

## 2021-11-26 DIAGNOSIS — C3411 Malignant neoplasm of upper lobe, right bronchus or lung: Secondary | ICD-10-CM

## 2021-11-26 DIAGNOSIS — Z95828 Presence of other vascular implants and grafts: Secondary | ICD-10-CM

## 2021-11-26 LAB — CBC WITH DIFFERENTIAL/PLATELET
Abs Immature Granulocytes: 0.04 10*3/uL (ref 0.00–0.07)
Basophils Absolute: 0 10*3/uL (ref 0.0–0.1)
Basophils Relative: 0 %
Eosinophils Absolute: 0.3 10*3/uL (ref 0.0–0.5)
Eosinophils Relative: 3 %
HCT: 27.2 % — ABNORMAL LOW (ref 39.0–52.0)
Hemoglobin: 8.3 g/dL — ABNORMAL LOW (ref 13.0–17.0)
Immature Granulocytes: 0 %
Lymphocytes Relative: 6 %
Lymphs Abs: 0.6 10*3/uL — ABNORMAL LOW (ref 0.7–4.0)
MCH: 27.5 pg (ref 26.0–34.0)
MCHC: 30.5 g/dL (ref 30.0–36.0)
MCV: 90.1 fL (ref 80.0–100.0)
Monocytes Absolute: 0.8 10*3/uL (ref 0.1–1.0)
Monocytes Relative: 8 %
Neutro Abs: 8 10*3/uL — ABNORMAL HIGH (ref 1.7–7.7)
Neutrophils Relative %: 83 %
Platelets: 215 10*3/uL (ref 150–400)
RBC: 3.02 MIL/uL — ABNORMAL LOW (ref 4.22–5.81)
RDW: 17.2 % — ABNORMAL HIGH (ref 11.5–15.5)
WBC: 9.7 10*3/uL (ref 4.0–10.5)
nRBC: 0 % (ref 0.0–0.2)

## 2021-11-26 LAB — COMPREHENSIVE METABOLIC PANEL
ALT: 15 U/L (ref 0–44)
AST: 20 U/L (ref 15–41)
Albumin: 2.7 g/dL — ABNORMAL LOW (ref 3.5–5.0)
Alkaline Phosphatase: 105 U/L (ref 38–126)
Anion gap: 5 (ref 5–15)
BUN: 11 mg/dL (ref 8–23)
CO2: 27 mmol/L (ref 22–32)
Calcium: 8.5 mg/dL — ABNORMAL LOW (ref 8.9–10.3)
Chloride: 99 mmol/L (ref 98–111)
Creatinine, Ser: 0.82 mg/dL (ref 0.61–1.24)
GFR, Estimated: >60 mL/min (ref >60)
Glucose, Bld: 275 mg/dL — ABNORMAL HIGH (ref 70–99)
Potassium: 3.4 mmol/L — ABNORMAL LOW (ref 3.5–5.1)
Sodium: 131 mmol/L — ABNORMAL LOW (ref 135–145)
Total Bilirubin: 0.4 mg/dL (ref 0.3–1.2)
Total Protein: 7.2 g/dL (ref 6.5–8.1)

## 2021-11-26 MED ORDER — SODIUM CHLORIDE 0.9% FLUSH
10.0000 mL | Freq: Once | INTRAVENOUS | Status: DC
  Filled 2021-11-26: qty 10

## 2021-11-26 MED ORDER — MEGESTROL ACETATE 625 MG/5ML PO SUSP: 625.0000 mg | Freq: Every day | ORAL | 0 refills | Status: DC

## 2021-11-26 MED ORDER — SODIUM CHLORIDE 0.9 % IV SOLN
INTRAVENOUS | Status: AC
  Filled 2021-11-26: qty 250

## 2021-11-26 MED ORDER — HEPARIN SOD (PORK) LOCK FLUSH 100 UNIT/ML IV SOLN
500.0000 [IU] | Freq: Once | INTRAVENOUS | Status: AC
  Administered 2021-11-26: 500 [IU] via INTRAVENOUS
  Filled 2021-11-26: qty 5

## 2021-11-26 MED ORDER — SODIUM CHLORIDE 0.9 % IV SOLN
INTRAVENOUS | Status: DC
  Filled 2021-11-26 (×2): qty 250

## 2021-11-26 NOTE — Progress Notes (Signed)
Symptom Management Clinic [facility]  Telephone:([phone] [phone]  Patient Care Team: [name], MD as PCP - General (Family Medicine) [name], RN as Oncology Nurse Navigator Orson Ape, MD as Consulting Physician (Urology) [name], MD as Consulting Physician (Oncology)   Name of the patient: Rodney Vega  [id number]  January 12, 1956   Date of visit: 11/26/21  Reason for Consult: Rodney Vega is a 66 year old man with multiple medical problems including stage IIIa non-small cell lung cancer status post chemotherapy and radiation.  Patient was most recently on treatment with [facility] but this has been held due to fatigue.  Patient last saw Dr. Donneta Romberg on 11/16/2021 at which time it was thought that patient performance status was slightly improved but decision was made to continue holding Imfinzi.  Patient was seen in [facility] on 11/18/2021 with poor oral intake and was started on mirtazapine as an appetite stimulant.  Patient was seen again in [facility] on 11/24/2021 with continued poor oral intake/dehydration with orthostasis.  He was an add-on to [facility] today for evaluation and management of dehydration with note of orthostatic vital signs.  Patient denies other symptomatic complaints.  No fever or chills.  No nausea vomiting or diarrhea.  No GU symptoms.  No respiratory issues.  He is only eating bites and drinking sips.  Patient reports that he just does not have much of an appetite or taste for food.  Denies any neurologic complaints. Denies any easy bleeding or bruising.  No fever or chills. Denies chest pain. Denies any nausea, vomiting, constipation, or diarrhea.  Patient offers no further specific complaints today.  PAST MEDICAL HISTORY: Past Medical History:  Diagnosis Date   Cancer (HCC)    Degenerative arthritis of knee    Depression    Diabetes mellitus without complication (HCC)    Diverticulitis    Glaucoma    since  2004   Hernia 1979   History of kidney stones    Hypertension    Personal history of tobacco use, presenting hazards to health    Pre-diabetes    Screening for obesity    Special screening for malignant neoplasms, colon    Wears dentures    full upper and lower    PAST SURGICAL HISTORY:  Past Surgical History:  Procedure Laterality Date   BACK SURGERY  1994, 2012   lumbar bulging disc   CATARACT EXTRACTION W/PHACO Left 01/20/2021   Procedure: CATARACT EXTRACTION PHACO AND INTRAOCULAR LENS PLACEMENT (IOC) LEFT;  Surgeon: Galen Manila, MD;  Location: [facility];  Service: Ophthalmology;  Laterality: Left;  10.13 0:52.1   COLONOSCOPY  [id number]   [facility]/Dr. Carmell Austria sessile polyp,74mm, found in rectum,multiple diverticula were found in the sigmoid, in descending and in transverse colon   COLONOSCOPY WITH PROPOFOL N/A 10/22/2021   Procedure: COLONOSCOPY WITH PROPOFOL;  Surgeon: Jaynie Collins, DO;  Location: [facility];  Service: Gastroenterology;  Laterality: N/A;  DM   COLOSTOMY  04-20-13   HERNIA REPAIR  1979   inguinal   IR IMAGING GUIDED PORT INSERTION  06/10/2021   JOINT REPLACEMENT Right    knee   KNEE ARTHROPLASTY Right 05/08/2018   Procedure: COMPUTER ASSISTED TOTAL KNEE ARTHROPLASTY;  Surgeon: Donato Heinz, MD;  Location: [facility] ORS;  Service: Orthopedics;  Laterality: Right;   KNEE ARTHROPLASTY Left 11/16/2019   Procedure: COMPUTER ASSISTED TOTAL KNEE ARTHROPLASTY;  Surgeon: Donato Heinz, MD;  Location: [facility] ORS;  Service: Orthopedics;  Laterality: Left;  LAPAROTOMY  04-20-13   colon resection with colosotmy   LITHOTRIPSY  2013   TONSILLECTOMY AND ADENOIDECTOMY  1962    HEMATOLOGY/ONCOLOGY HISTORY:  Oncology History Overview Note  AUG 2022- 8.2 x 5.9 cm spiculated mass noted in right upper lobe consistent with malignancy. It extends from the right hilum to the lateral chest wall and results in lytic destruction of the lateral portion of the right  fourth rib.  Mediastinal lymph nodes are noted, including 12 mm right hilar lymph node, consistent with metastatic disease. 3 cm right adrenal mass is noted concerning for metastatic disease.  # AUG 2022- PET IMPRESSION: Peripherally hypermetabolic right upper lobe mass, likely due to central necrosis. Mass extends into the right hilum and right lateral chest wall involving the second through fourth right ribs.   Mildly enlarged and hypermetabolic right hilar lymph node.   No evidence of metastatic disease in the abdomen or pelvis.  # AUG, 2022-RIGHT CHEST WALL Bx-necrosis; suspicious for malignancy not definitive [discussed with Dr.Kraynie;MDT] LUNG MASS, RIGHT; BIOPSY:  - SUSPICIOUS FOR MALIGNANCY.  - SEE COMMENT.   Comment:  Approximately six tissue cores demonstrate inflamed fibrous capsule with  hemosiderin-laden macrophages, in a background of abundant necrosis.  One of the cores demonstrates a minute fragment of viable epithelial  cells.  These cells are positive for p40.  They are negative for TTF1  and CK7. The cells of interest disappear on deeper cut levels.  While  the findings are suspicious for squamous cell carcinoma, there is not  enough viable tumor for a definitive diagnosis.  Additional tissue  sampling may be helpful.   # SEP 2022-cycle #2 Taxol hypersensitive reaction.  Switched over to #3 cycle- carbo-Abraxane starting 06/29/2021  # DEC 2022- s/p ID- Dr.ravishankar- ? Lung abscess-no antibiotics-  JAN 2023- CT scan incidental- cecal mass- colonoscopy[Dr.Russo; [facility]- high grade dysplasia]  S/p evaluation with Dr. Lindaann Vega surgery for now]  # S/p evaluation with Dr. Eliberto Vega.   Primary cancer of right upper lobe of lung ([place])  06/03/2021 Initial Diagnosis   Primary cancer of right upper lobe of lung ([place])   06/03/2021 Cancer Staging   Staging form: Lung, AJCC 8th Edition - Clinical: Stage IIIB (cT3, cN2, cM0) - Signed by [name], MD on  06/03/2021   06/15/2021 - 08/03/2021 Chemotherapy   Patient is on Treatment Plan : LUNG Carboplatin / Paclitaxel + XRT q7d     10/02/2021 -  Chemotherapy   Patient is on Treatment Plan : LUNG Durvalumab q14d       ALLERGIES:  is allergic to paclitaxel, ambien [zolpidem], nsaids, and aleve [naproxen sodium].  MEDICATIONS:  Current Outpatient Medications  Medication Sig Dispense Refill   acetaminophen (TYLENOL) 500 MG tablet Take 1,000 mg by mouth daily.     albuterol (ACCUNEB) 0.63 MG/3ML nebulizer solution Take 3 mLs (0.63 mg total) by nebulization every 4 (four) hours as needed. Per pharmacy 0.83% dose available (this dose not available in Epic).  This dose was called in to [employer] 360 mL 3   albuterol (VENTOLIN HFA) 108 (90 Base) MCG/ACT inhaler Inhale 2 puffs into the lungs every 6 (six) hours as needed for wheezing or shortness of breath. 1 each 2   baclofen (LIORESAL) 10 MG tablet Take 5 mg by mouth at bedtime as needed for muscle spasms.     blood glucose meter kit and supplies KIT Check your blood glucose levels twice a day; once in the morning before breakfast; and once  in the evening after dinner. 1 each 0   cetirizine (ZYRTEC) 10 MG tablet Take 10 mg by mouth daily.     Cyanocobalamin (VITAMIN B-12 PO) Take by mouth daily.     diphenhydramine-acetaminophen (TYLENOL PM) 25-500 MG TABS Take 1-2 tablets by mouth at bedtime as needed (sleep.).      dorzolamide-timolol (COSOPT) 22.3-6.8 MG/ML ophthalmic solution Place 1 drop into both eyes 2 (two) times daily.     fluticasone-salmeterol (ADVAIR DISKUS) 500-50 MCG/ACT AEPB Inhale 1 puff into the lungs in the morning and at bedtime. 1 each 3   glipiZIDE (GLUCOTROL) 5 MG tablet Take 1 tablet (5 mg total) by mouth 2 (two) times daily before a meal. 60 tablet 3   glucose blood test strip Check your blood glucose levels twice a day; once in the morning before breakfast; and once in the evening after dinner. 100 each 12    guaiFENesin-codeine 100-10 MG/5ML syrup Take 5 mLs by mouth daily as needed for cough. 120 mL 0   ipratropium (ATROVENT) 0.02 % nebulizer solution Take 2.5 mLs (0.5 mg total) by nebulization 4 (four) times daily. 360 mL 3   ipratropium-albuterol (DUONEB) 0.5-2.5 (3) MG/3ML SOLN Take 3 mLs by nebulization every 4 (four) hours as needed. 360 mL 3   Lancets (FREESTYLE) lancets Check your blood glucose levels twice a day; once in the morning before breakfast; and once in the evening after dinner. 100 each 12   latanoprost (XALATAN) 0.005 % ophthalmic solution Place 1 drop into both eyes at bedtime.     mirtazapine (REMERON) 7.5 MG tablet Take 1 tablet (7.5 mg total) by mouth at bedtime. 30 tablet 0   montelukast (SINGULAIR) 10 MG tablet TAKE 1 TABLET(10 MG) BY MOUTH AT BEDTIME 90 tablet 0   nicotine (NICODERM CQ - DOSED IN MG/24 HOURS) 21 mg/24hr patch Place 1 patch (21 mg total) onto the skin daily. 28 patch 1   olmesartan (BENICAR) 40 MG tablet Take 40 mg by mouth daily.     pregabalin (LYRICA) 50 MG capsule Take 1 capsule (50 mg total) by mouth 2 (two) times daily. 60 capsule 1   Respiratory Therapy Supplies (NEBULIZER) DEVI Use as directed 1 each 0   sucralfate (CARAFATE) 1 g tablet Take 1 tablet (1 g total) by mouth 3 (three) times daily. Dissolve in 3-4 tbsp warm water, swish and swallow 90 tablet 1   tamsulosin (FLOMAX) 0.4 MG CAPS capsule Take 0.4 mg by mouth daily.     traMADol (ULTRAM) 50 MG tablet Take 2 tablets (100 mg total) by mouth at bedtime as needed. 60 tablet 0   venlafaxine XR (EFFEXOR-XR) 75 MG 24 hr capsule Take 225 mg by mouth daily.     No current facility-administered medications for this visit.   Facility-Administered Medications Ordered in Other Visits  Medication Dose Route Frequency Provider Last Rate Last Admin   heparin lock flush 100 UNIT/ML injection            heparin lock flush 100 UNIT/ML injection            heparin lock flush 100 UNIT/ML injection              VITAL SIGNS: BP (!) 85/60    Pulse (!) 115    Temp 97.8 F (36.6 C) (Tympanic)    Resp 16    SpO2 97%  There were no vitals filed for this visit.  Estimated body mass index is 25.24 kg/m as calculated from the  following:   Height as of 11/02/21: $RemoveBef'5\' 11"'mkStrMbrRB$  (1.803 m).   Weight as of an earlier encounter on 11/26/21: 181 lb (82.1 kg).  LABS: CBC:    Component Value Date/Time   WBC 11.5 (H) 11/24/2021 1005   HGB 8.5 (L) 11/24/2021 1005   HGB 12.5 (L) 09/21/2013 0522   HCT 27.0 (L) 11/24/2021 1005   HCT 36.3 (L) 09/21/2013 0522   PLT 192 11/24/2021 1005   PLT 185 09/21/2013 0522   MCV 89.7 11/24/2021 1005   MCV 91 09/21/2013 0522   NEUTROABS 9.5 (H) 11/24/2021 1005   NEUTROABS 11.3 (H) 09/21/2013 0522   LYMPHSABS 0.7 11/24/2021 1005   LYMPHSABS 1.4 09/21/2013 0522   MONOABS 0.7 11/24/2021 1005   MONOABS 0.9 09/21/2013 0522   EOSABS 0.4 11/24/2021 1005   EOSABS 0.0 09/21/2013 0522   BASOSABS 0.0 11/24/2021 1005   BASOSABS 0.1 09/21/2013 0522   Comprehensive Metabolic Panel:    Component Value Date/Time   NA 132 (L) 11/24/2021 1005   NA 138 09/20/2013 0639   K 3.2 (L) 11/24/2021 1005   K 4.0 09/20/2013 0639   CL 97 (L) 11/24/2021 1005   CL 107 09/20/2013 0639   CO2 26 11/24/2021 1005   CO2 26 09/20/2013 0639   BUN 11 11/24/2021 1005   BUN 12 09/20/2013 0639   CREATININE 0.89 11/24/2021 1005   CREATININE 0.84 09/24/2013 0515   GLUCOSE 247 (H) 11/24/2021 1005   GLUCOSE 160 (H) 09/20/2013 0639   CALCIUM 8.5 (L) 11/24/2021 1005   CALCIUM 8.2 (L) 09/20/2013 0639   AST 18 11/24/2021 1005   AST 22 01/08/2012 0939   ALT 13 11/24/2021 1005   ALT 20 01/08/2012 0939   ALKPHOS 102 11/24/2021 1005   ALKPHOS 69 01/08/2012 0939   BILITOT 0.3 11/24/2021 1005   BILITOT 1.1 (H) 01/08/2012 0939   PROT 7.2 11/24/2021 1005   PROT 6.5 09/12/2013 1002   PROT 6.9 01/08/2012 0939   ALBUMIN 2.8 (L) 11/24/2021 1005   ALBUMIN 4.4 09/12/2013 1002   ALBUMIN 3.5 01/08/2012 0939     RADIOGRAPHIC STUDIES: No results found.  PERFORMANCE STATUS (ECOG) : 1 - Symptomatic but completely ambulatory  Review of Systems Unless otherwise noted, a complete review of systems is negative.  Physical Exam General: NAD Cardiac: RRR Pulmonary: Chronically coarse to auscultation anterior/posterior fields Abdomen: Soft, nontender to palp Extremities: no edema, no joint deformities Skin: no rashes Neurological: Grossly nonfocal  Assessment and Plan- Patient is a 66 y.o. male  with multiple medical problems including stage IIIa non-small cell lung cancer status post chemotherapy and radiation.  Patient [facility] to [facility] today for evaluation of poor appetite/dehydration.   Dehydration-orthostatic vitals, weight, and labs confirm fluid volume deficit.  Patient reports that he is only eating bites and drinking sips.  I had tried patient on mirtazapine but there was no significant improvement in appetite so we will discontinue this.  We will trial Megace.  Both patient and wife verbalized understanding of increased clotting risk and are okay with proceeding.  We will plan on IV fluids today and patient to RTC tomorrow again for labs/fluids.   Suspect etiology of general decline is related to possible cancer progression.  Patient verbalized understanding that he has been declining and could be approaching end-of-life.  However, he still hopes for improvement and that he will be able to restart cancer treatment.  Hospice has been discussed as an option but patient says that he is not ready for this.  We also discussed option of hospitalization but patient also declined that.  Case and plan discussed with Dr. Rogue Bussing   Patient expressed understanding and was in agreement with this plan. He also understands that He can call clinic at any time with any questions, concerns, or complaints.   Thank you for allowing me to participate in the care of this very pleasant patient.   Time Total: 25  minutes  Visit consisted of counseling and education dealing with the complex and emotionally intense issues of symptom management in the setting of serious illness.Greater than 50%  of this time was spent counseling and coordinating care related to the above assessment and plan.  Signed by: Altha Harm, PhD, NP-C

## 2021-11-26 NOTE — Progress Notes (Signed)
Pt here for IV fluids. Per significant other, pt is barely eating and is still very weak. Pt denies any further episodes of dizziness or falling. Orthostatic vitals obtained. BP 118/59, P-101 sitting and dropped to 85/60, P-115, with standing. [facility] appointment was added.

## 2021-11-26 NOTE — Patient Instructions (Signed)

## 2021-11-27 ENCOUNTER — Other Ambulatory Visit: Payer: Self-pay | Admitting: *Deleted

## 2021-11-27 ENCOUNTER — Other Ambulatory Visit: Payer: Self-pay

## 2021-11-27 ENCOUNTER — Inpatient Hospital Stay: Payer: Medicare PPO

## 2021-11-27 ENCOUNTER — Telehealth: Payer: Self-pay | Admitting: Internal Medicine

## 2021-11-27 VITALS — BP 120/64 | HR 107 | Temp 99.8°F | Resp 16

## 2021-11-27 DIAGNOSIS — C3411 Malignant neoplasm of upper lobe, right bronchus or lung: Secondary | ICD-10-CM | POA: Diagnosis not present

## 2021-11-27 MED ORDER — SODIUM CHLORIDE 0.9% FLUSH
10.0000 mL | Freq: Once | INTRAVENOUS | Status: AC | PRN
  Administered 2021-11-27: 10 mL
  Filled 2021-11-27: qty 10

## 2021-11-27 MED ORDER — ONDANSETRON HCL 4 MG/2ML IJ SOLN
8.0000 mg | Freq: Once | INTRAMUSCULAR | Status: AC
  Administered 2021-11-27: 8 mg via INTRAVENOUS
  Filled 2021-11-27: qty 4

## 2021-11-27 MED ORDER — SODIUM CHLORIDE 0.9 % IV SOLN
Freq: Once | INTRAVENOUS | Status: AC
  Filled 2021-11-27: qty 250

## 2021-11-27 MED ORDER — TRAMADOL HCL 50 MG PO TABS: 100.0000 mg | ORAL_TABLET | Freq: Four times a day (QID) | ORAL | 0 refills | Status: DC | PRN

## 2021-11-27 MED ORDER — HEPARIN SOD (PORK) LOCK FLUSH 100 UNIT/ML IV SOLN
500.0000 [IU] | Freq: Once | INTRAVENOUS | Status: AC | PRN
  Administered 2021-11-27: 500 [IU]
  Filled 2021-11-27: qty 5

## 2021-11-27 MED ORDER — SODIUM CHLORIDE 0.9 % IV SOLN: Freq: Once | INTRAVENOUS | Status: DC

## 2021-11-27 NOTE — Telephone Encounter (Signed)
Spoke to patient-feels improved after IV infusions.  Recommend using Megace.  Discussed with [employer].  Await CT scans next week; follow-up thereafter with me.

## 2021-11-29 ENCOUNTER — Emergency Department: Payer: Medicare PPO

## 2021-11-29 ENCOUNTER — Encounter: Payer: Self-pay | Admitting: Internal Medicine

## 2021-11-29 ENCOUNTER — Other Ambulatory Visit: Payer: Self-pay

## 2021-11-29 ENCOUNTER — Inpatient Hospital Stay
Admission: EM | Admit: 2021-11-29 | Discharge: 2021-12-03 | DRG: 871 | Disposition: A | Discharge status: Home / Self Care | Payer: Medicare PPO | Attending: Hospitalist | Admitting: Hospitalist

## 2021-11-29 ENCOUNTER — Other Ambulatory Visit: Payer: Self-pay | Admitting: Internal Medicine

## 2021-11-29 DIAGNOSIS — I10 Essential (primary) hypertension: Secondary | ICD-10-CM | POA: Diagnosis present

## 2021-11-29 DIAGNOSIS — R6520 Severe sepsis without septic shock: Secondary | ICD-10-CM | POA: Diagnosis present

## 2021-11-29 DIAGNOSIS — Z933 Colostomy status: Secondary | ICD-10-CM | POA: Diagnosis not present

## 2021-11-29 DIAGNOSIS — E860 Dehydration: Secondary | ICD-10-CM | POA: Diagnosis present

## 2021-11-29 DIAGNOSIS — Z833 Family history of diabetes mellitus: Secondary | ICD-10-CM

## 2021-11-29 DIAGNOSIS — R0902 Hypoxemia: Secondary | ICD-10-CM

## 2021-11-29 DIAGNOSIS — E440 Moderate protein-calorie malnutrition: Secondary | ICD-10-CM | POA: Insufficient documentation

## 2021-11-29 DIAGNOSIS — A419 Sepsis, unspecified organism: Principal | ICD-10-CM | POA: Diagnosis present

## 2021-11-29 DIAGNOSIS — F32A Depression, unspecified: Secondary | ICD-10-CM | POA: Diagnosis present

## 2021-11-29 DIAGNOSIS — H409 Unspecified glaucoma: Secondary | ICD-10-CM | POA: Diagnosis present

## 2021-11-29 DIAGNOSIS — E119 Type 2 diabetes mellitus without complications: Secondary | ICD-10-CM | POA: Diagnosis present

## 2021-11-29 DIAGNOSIS — J9601 Acute respiratory failure with hypoxia: Secondary | ICD-10-CM | POA: Diagnosis present

## 2021-11-29 DIAGNOSIS — Z6826 Body mass index (BMI) 26.0-26.9, adult: Secondary | ICD-10-CM

## 2021-11-29 DIAGNOSIS — Z923 Personal history of irradiation: Secondary | ICD-10-CM

## 2021-11-29 DIAGNOSIS — Y842 Radiological procedure and radiotherapy as the cause of abnormal reaction of the patient, or of later complication, without mention of misadventure at the time of the procedure: Secondary | ICD-10-CM | POA: Diagnosis present

## 2021-11-29 DIAGNOSIS — C3411 Malignant neoplasm of upper lobe, right bronchus or lung: Secondary | ICD-10-CM | POA: Diagnosis present

## 2021-11-29 DIAGNOSIS — Z20822 Contact with and (suspected) exposure to covid-19: Secondary | ICD-10-CM | POA: Diagnosis present

## 2021-11-29 DIAGNOSIS — Z886 Allergy status to analgesic agent status: Secondary | ICD-10-CM

## 2021-11-29 DIAGNOSIS — D630 Anemia in neoplastic disease: Secondary | ICD-10-CM | POA: Diagnosis present

## 2021-11-29 DIAGNOSIS — J441 Chronic obstructive pulmonary disease with (acute) exacerbation: Secondary | ICD-10-CM | POA: Diagnosis present

## 2021-11-29 DIAGNOSIS — Z79899 Other long term (current) drug therapy: Secondary | ICD-10-CM

## 2021-11-29 DIAGNOSIS — D649 Anemia, unspecified: Secondary | ICD-10-CM | POA: Diagnosis present

## 2021-11-29 DIAGNOSIS — D509 Iron deficiency anemia, unspecified: Secondary | ICD-10-CM | POA: Diagnosis present

## 2021-11-29 DIAGNOSIS — Z7951 Long term (current) use of inhaled steroids: Secondary | ICD-10-CM

## 2021-11-29 DIAGNOSIS — R0602 Shortness of breath: Secondary | ICD-10-CM | POA: Diagnosis present

## 2021-11-29 DIAGNOSIS — J440 Chronic obstructive pulmonary disease with acute lower respiratory infection: Secondary | ICD-10-CM | POA: Diagnosis present

## 2021-11-29 DIAGNOSIS — J9621 Acute and chronic respiratory failure with hypoxia: Secondary | ICD-10-CM | POA: Diagnosis present

## 2021-11-29 DIAGNOSIS — Z888 Allergy status to other drugs, medicaments and biological substances status: Secondary | ICD-10-CM

## 2021-11-29 DIAGNOSIS — Z9221 Personal history of antineoplastic chemotherapy: Secondary | ICD-10-CM | POA: Diagnosis not present

## 2021-11-29 DIAGNOSIS — R638 Other symptoms and signs concerning food and fluid intake: Secondary | ICD-10-CM

## 2021-11-29 DIAGNOSIS — Z96653 Presence of artificial knee joint, bilateral: Secondary | ICD-10-CM | POA: Diagnosis present

## 2021-11-29 DIAGNOSIS — Z8249 Family history of ischemic heart disease and other diseases of the circulatory system: Secondary | ICD-10-CM

## 2021-11-29 DIAGNOSIS — F17210 Nicotine dependence, cigarettes, uncomplicated: Secondary | ICD-10-CM | POA: Diagnosis present

## 2021-11-29 DIAGNOSIS — C189 Malignant neoplasm of colon, unspecified: Secondary | ICD-10-CM | POA: Diagnosis present

## 2021-11-29 DIAGNOSIS — K6389 Other specified diseases of intestine: Secondary | ICD-10-CM | POA: Diagnosis present

## 2021-11-29 DIAGNOSIS — Z7984 Long term (current) use of oral hypoglycemic drugs: Secondary | ICD-10-CM

## 2021-11-29 DIAGNOSIS — J309 Allergic rhinitis, unspecified: Secondary | ICD-10-CM | POA: Diagnosis present

## 2021-11-29 DIAGNOSIS — J189 Pneumonia, unspecified organism: Secondary | ICD-10-CM | POA: Diagnosis present

## 2021-11-29 DIAGNOSIS — R0600 Dyspnea, unspecified: Secondary | ICD-10-CM

## 2021-11-29 DIAGNOSIS — E876 Hypokalemia: Secondary | ICD-10-CM | POA: Diagnosis present

## 2021-11-29 LAB — CBC WITH DIFFERENTIAL/PLATELET
Abs Immature Granulocytes: 0.09 10*3/uL — ABNORMAL HIGH (ref 0.00–0.07)
Basophils Absolute: 0 10*3/uL (ref 0.0–0.1)
Basophils Relative: 0 %
Eosinophils Absolute: 0.2 10*3/uL (ref 0.0–0.5)
Eosinophils Relative: 1 %
HCT: 24.5 % — ABNORMAL LOW (ref 39.0–52.0)
Hemoglobin: 7.7 g/dL — ABNORMAL LOW (ref 13.0–17.0)
Immature Granulocytes: 1 %
Lymphocytes Relative: 5 %
Lymphs Abs: 0.8 10*3/uL (ref 0.7–4.0)
MCH: 28.1 pg (ref 26.0–34.0)
MCHC: 31.4 g/dL (ref 30.0–36.0)
MCV: 89.4 fL (ref 80.0–100.0)
Monocytes Absolute: 0.9 10*3/uL (ref 0.1–1.0)
Monocytes Relative: 6 %
Neutro Abs: 14 10*3/uL — ABNORMAL HIGH (ref 1.7–7.7)
Neutrophils Relative %: 87 %
Platelets: 226 10*3/uL (ref 150–400)
RBC: 2.74 MIL/uL — ABNORMAL LOW (ref 4.22–5.81)
RDW: 17.2 % — ABNORMAL HIGH (ref 11.5–15.5)
WBC: 15.9 10*3/uL — ABNORMAL HIGH (ref 4.0–10.5)
nRBC: 0 % (ref 0.0–0.2)

## 2021-11-29 LAB — TROPONIN I (HIGH SENSITIVITY)
Troponin I (High Sensitivity): 10 ng/L (ref <18)
Troponin I (High Sensitivity): 9 ng/L (ref <18)

## 2021-11-29 LAB — BLOOD GAS, VENOUS
Acid-Base Excess: 5.1 mmol/L — ABNORMAL HIGH (ref 0.0–2.0)
Bicarbonate: 29 mmol/L — ABNORMAL HIGH (ref 20.0–28.0)
O2 Saturation: 98.3 %
Patient temperature: 37
pCO2, Ven: 39 mmHg — ABNORMAL LOW (ref 44–60)
pH, Ven: 7.48 — ABNORMAL HIGH (ref 7.25–7.43)
pO2, Ven: 92 mmHg — ABNORMAL HIGH (ref 32–45)

## 2021-11-29 LAB — APTT: aPTT: 35 seconds (ref 24–36)

## 2021-11-29 LAB — MAGNESIUM: Magnesium: 2 mg/dL (ref 1.7–2.4)

## 2021-11-29 LAB — PROCALCITONIN: Procalcitonin: 0.41 ng/mL

## 2021-11-29 LAB — LACTIC ACID, PLASMA: Lactic Acid, Venous: 1 mmol/L (ref 0.5–1.9)

## 2021-11-29 LAB — PROTIME-INR
INR: 1.3 — ABNORMAL HIGH (ref 0.8–1.2)
Prothrombin Time: 16.1 seconds — ABNORMAL HIGH (ref 11.4–15.2)

## 2021-11-29 MED ORDER — ACETAMINOPHEN 325 MG PO TABS
650.0000 mg | ORAL_TABLET | Freq: Four times a day (QID) | ORAL | Status: DC | PRN
  Administered 2021-12-02: 650 mg via ORAL
  Filled 2021-11-29: qty 2

## 2021-11-29 MED ORDER — SODIUM CHLORIDE 0.9 % IV SOLN
500.0000 mg | Freq: Once | INTRAVENOUS | Status: AC
  Administered 2021-11-29: 500 mg via INTRAVENOUS
  Filled 2021-11-29: qty 5

## 2021-11-29 MED ORDER — SODIUM CHLORIDE 0.9 % IV SOLN
1.0000 g | Freq: Once | INTRAVENOUS | Status: AC
  Administered 2021-11-29: 1 g via INTRAVENOUS
  Filled 2021-11-29: qty 1

## 2021-11-29 MED ORDER — POTASSIUM CHLORIDE CRYS ER 20 MEQ PO TBCR
40.0000 meq | EXTENDED_RELEASE_TABLET | Freq: Once | ORAL | Status: AC
Start: 2021-11-30 — End: 2021-11-30
  Administered 2021-11-30: 40 meq via ORAL
  Filled 2021-11-29: qty 2

## 2021-11-29 MED ORDER — LACTATED RINGERS IV SOLN: INTRAVENOUS | Status: AC

## 2021-11-29 MED ORDER — ACETAMINOPHEN 650 MG RE SUPP
650.0000 mg | Freq: Four times a day (QID) | RECTAL | Status: DC | PRN
Start: 2021-11-29 — End: 2021-12-03

## 2021-11-29 NOTE — ED Provider Notes (Signed)
[facility] Provider Note    Event Date/Time   First MD Initiated Contact with Patient 11/29/21 2104     (approximate)   History   Shortness of Breath   HPI  Rodney Vega is a 66 y.o. male who is being treated for lung cancer.  Last treatment was 2 days ago.  He complains of increasing cough productive of thick yellow-green phlegm starting today with increasing pleuritic chest pain and shortness of breath.  On arrival here he is tachycardic at a rate of 118 and has an O2 sat of 89%.  Respiratory rate is 24.    Problems Problem Noted Date  SCC of lung (small cell carcinoma), right 06/08/2021  Overview:    Formatting of this note might be different from the original. 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  Status post total right knee replacement 07/02/2018  Obesity (BMI 30.0-34.9), unspecified 10/11/2017  Chronic pain of left knee 11/11/2015  Nephrolithiasis    Overview:       Physical Exam   Triage Vital Signs: ED Triage Vitals  Enc Vitals Group     BP 11/29/21 2042 120/61     Pulse Rate 11/29/21 2042 (!) 118     Resp 11/29/21 2042 (!) 24     Temp 11/29/21 2042 99 F (37.2 C)     Temp Source 11/29/21 2042 Oral     SpO2 11/29/21 2042 (!) 89 %     Weight 11/29/21 2043 184 lb (83.5 kg)     Height 11/29/21 2043 5\' 11"  (1.803 m)     Head Circumference --      Peak Flow --      Pain Score 11/29/21 2043 3     Pain Loc --      Pain Edu? --      Excl. in [place]? --     Most recent vital signs: Vitals:   11/29/21 2200 11/29/21 2230  BP: 120/66 114/71  Pulse: (!) 106 (!) 107  Resp: 18 20  Temp:    SpO2: 92% 91%     General: Awake, no distress.  CV:  Good peripheral perfusion.  Heart regular rate and rhythm no audible murmurs Resp:  Normal effort.  Lungs are clear although not so sure about the right upper lobe Abd:  No distention.   Soft and nontender Extremities: Slight trace edema bilaterally   ED Results / Procedures / Treatments   Labs (all labs ordered are listed, but only abnormal results are displayed) Labs Reviewed  COMPREHENSIVE METABOLIC PANEL - Abnormal; Notable for the following components:      Result Value   Potassium 3.1 (*)    Glucose, Bld 185 (*)    BUN 7 (*)    Calcium 8.5 (*)    Total Protein 6.4 (*)    Albumin 2.3 (*)    All other components within normal limits  CBC WITH DIFFERENTIAL/PLATELET - Abnormal; Notable for the following components:   WBC 15.9 (*)    RBC 2.74 (*)    Hemoglobin 7.7 (*)    HCT 24.5 (*)    RDW 17.2 (*)    Neutro Abs 14.0 (*)    Abs Immature Granulocytes 0.09 (*)    All other components within normal limits  PROTIME-INR - Abnormal; Notable for the following components:   Prothrombin Time 16.1 (*)    INR 1.3 (*)    All  other components within normal limits  BLOOD GAS, VENOUS - Abnormal; Notable for the following components:   pH, Ven 7.48 (*)    pCO2, Ven 39 (*)    pO2, Ven 92 (*)    Bicarbonate 29.0 (*)    Acid-Base Excess 5.1 (*)    All other components within normal limits  CULTURE, BLOOD (ROUTINE X 2)  CULTURE, BLOOD (ROUTINE X 2)  URINE CULTURE  LACTIC ACID, PLASMA  APTT  LACTIC ACID, PLASMA  URINALYSIS, COMPLETE (UACMP) WITH MICROSCOPIC  PROCALCITONIN  TYPE AND SCREEN  TROPONIN I (HIGH SENSITIVITY)  TROPONIN I (HIGH SENSITIVITY)     EKG  EKG read and interpreted by me shows sinus tach at a rate of 114 normal axis nonspecific ST-T wave changes   RADIOLOGY Chest x-ray reviewed by me shows density in the right upper lobe increased markings bilaterally especially in the right side.  We will have to compare to old films to see how bad this is.  We will await the rest we will await the radiologist reading    Critical Care performed:  Procedures   MEDICATIONS ORDERED IN ED: Medications  ceFEPIme (MAXIPIME) 1 g in sodium chloride 0.9 %  100 mL IVPB (has no administration in time range)  azithromycin (ZITHROMAX) 500 mg in sodium chloride 0.9 % 250 mL IVPB (has no administration in time range)     IMPRESSION / MDM / ASSESSMENT AND PLAN / ED COURSE  I reviewed the triage vital signs and the nursing notes. ----------------------------------------- 10:54 PM on 11/29/2021 ----------------------------------------- Patient's wife is here now.  She says he has not had chemo for some time but did get some immunotherapy and that got him very dehydrated he has been going into the clinic and getting fluids he supposed to get another liter tomorrow   The patient is on the cardiac monitor to evaluate for evidence of arrhythmia and/or significant heart rate changes.  None have been seen  I have reviewed the patient's old records and lab work and the current studies most of which are back now.  The chest x-ray and the white count the patient's history make me think he most likely has a pneumonia.  I am consulting with the hospitalist.  We will have to get him in the hospital he is oxygen dependent now.  Likely he will need antibiotics procalcitonin is pending but will help determine this.      FINAL CLINICAL IMPRESSION(S) / ED DIAGNOSES   Final diagnoses:  Hypoxia  Dyspnea, unspecified type  Community acquired pneumonia, unspecified laterality     Rx / DC Orders   ED Discharge Orders     None        Note:  This document was prepared using Dragon voice recognition software and may include unintentional dictation errors.   Nena Polio, MD 11/29/21 2256

## 2021-11-29 NOTE — ED Notes (Signed)
Pt placed on 2lpm [place] oxygen during triage.

## 2021-11-29 NOTE — ED Triage Notes (Signed)
Pt with history of lung cancer with radiation. Pt's spouse states pt has had oxygen in the 80s since this afternoon. Pt also complains of left sided shoulder pain and shortness of breath with cough.

## 2021-11-30 ENCOUNTER — Encounter: Payer: Self-pay | Admitting: Internal Medicine

## 2021-11-30 ENCOUNTER — Inpatient Hospital Stay: Payer: Medicare PPO

## 2021-11-30 DIAGNOSIS — R0602 Shortness of breath: Secondary | ICD-10-CM | POA: Diagnosis present

## 2021-11-30 DIAGNOSIS — J9621 Acute and chronic respiratory failure with hypoxia: Secondary | ICD-10-CM | POA: Diagnosis present

## 2021-11-30 DIAGNOSIS — A419 Sepsis, unspecified organism: Secondary | ICD-10-CM | POA: Diagnosis present

## 2021-11-30 DIAGNOSIS — E876 Hypokalemia: Secondary | ICD-10-CM | POA: Diagnosis present

## 2021-11-30 DIAGNOSIS — R6520 Severe sepsis without septic shock: Secondary | ICD-10-CM | POA: Diagnosis not present

## 2021-11-30 DIAGNOSIS — F32A Depression, unspecified: Secondary | ICD-10-CM | POA: Diagnosis present

## 2021-11-30 DIAGNOSIS — J9601 Acute respiratory failure with hypoxia: Secondary | ICD-10-CM | POA: Diagnosis present

## 2021-11-30 LAB — COMPREHENSIVE METABOLIC PANEL
ALT: 13 U/L (ref 0–44)
AST: 15 U/L (ref 15–41)
Albumin: 2.2 g/dL — ABNORMAL LOW (ref 3.5–5.0)
Alkaline Phosphatase: 88 U/L (ref 38–126)
Anion gap: 11 (ref 5–15)
BUN: 7 mg/dL — ABNORMAL LOW (ref 8–23)
CO2: 23 mmol/L (ref 22–32)
Calcium: 8.1 mg/dL — ABNORMAL LOW (ref 8.9–10.3)
Chloride: 102 mmol/L (ref 98–111)
Creatinine, Ser: 0.76 mg/dL (ref 0.61–1.24)
GFR, Estimated: >60 mL/min (ref >60)
Glucose, Bld: 165 mg/dL — ABNORMAL HIGH (ref 70–99)
Potassium: 3.6 mmol/L (ref 3.5–5.1)
Sodium: 136 mmol/L (ref 135–145)
Total Bilirubin: 0.2 mg/dL — ABNORMAL LOW (ref 0.3–1.2)
Total Protein: 6.1 g/dL — ABNORMAL LOW (ref 6.5–8.1)

## 2021-11-30 LAB — LACTIC ACID, PLASMA: Lactic Acid, Venous: 0.8 mmol/L (ref 0.5–1.9)

## 2021-11-30 LAB — CBC WITH DIFFERENTIAL/PLATELET
Abs Immature Granulocytes: 0.06 10*3/uL (ref 0.00–0.07)
Basophils Absolute: 0 10*3/uL (ref 0.0–0.1)
Basophils Relative: 0 %
Eosinophils Absolute: 0.2 10*3/uL (ref 0.0–0.5)
Eosinophils Relative: 1 %
HCT: 23.8 % — ABNORMAL LOW (ref 39.0–52.0)
Hemoglobin: 7.3 g/dL — ABNORMAL LOW (ref 13.0–17.0)
Immature Granulocytes: 1 %
Lymphocytes Relative: 6 %
Lymphs Abs: 0.7 10*3/uL (ref 0.7–4.0)
MCH: 27.4 pg (ref 26.0–34.0)
MCHC: 30.7 g/dL (ref 30.0–36.0)
MCV: 89.5 fL (ref 80.0–100.0)
Monocytes Absolute: 0.7 10*3/uL (ref 0.1–1.0)
Monocytes Relative: 6 %
Neutro Abs: 10.7 10*3/uL — ABNORMAL HIGH (ref 1.7–7.7)
Neutrophils Relative %: 86 %
Platelets: 198 10*3/uL (ref 150–400)
RBC: 2.66 MIL/uL — ABNORMAL LOW (ref 4.22–5.81)
RDW: 17.2 % — ABNORMAL HIGH (ref 11.5–15.5)
WBC: 12.5 10*3/uL — ABNORMAL HIGH (ref 4.0–10.5)
nRBC: 0 % (ref 0.0–0.2)

## 2021-11-30 LAB — CBG MONITORING, ED
Glucose-Capillary: 134 mg/dL — ABNORMAL HIGH (ref 70–99)
Glucose-Capillary: 162 mg/dL — ABNORMAL HIGH (ref 70–99)
Glucose-Capillary: 137 mg/dL — ABNORMAL HIGH (ref 70–99)
Glucose-Capillary: 113 mg/dL — ABNORMAL HIGH (ref 70–99)

## 2021-11-30 LAB — URINALYSIS, COMPLETE (UACMP) WITH MICROSCOPIC
Bacteria, UA: NONE SEEN
Bilirubin Urine: NEGATIVE
Glucose, UA: 50 mg/dL — AB
Ketones, ur: NEGATIVE mg/dL
Leukocytes,Ua: NEGATIVE
Nitrite: NEGATIVE
Protein, ur: NEGATIVE mg/dL
Specific Gravity, Urine: 1.004 — ABNORMAL LOW (ref 1.005–1.030)
pH: 7 (ref 5.0–8.0)

## 2021-11-30 LAB — EXPECTORATED SPUTUM ASSESSMENT W GRAM STAIN, RFLX TO RESP C

## 2021-11-30 LAB — RESP PANEL BY RT-PCR (FLU A&B, COVID) ARPGX2
Influenza A by PCR: NEGATIVE
Influenza B by PCR: NEGATIVE
SARS Coronavirus 2 by RT PCR: NEGATIVE

## 2021-11-30 LAB — STREP PNEUMONIAE URINARY ANTIGEN: Strep Pneumo Urinary Antigen: NEGATIVE

## 2021-11-30 LAB — PHOSPHORUS: Phosphorus: 3.4 mg/dL (ref 2.5–4.6)

## 2021-11-30 LAB — GLUCOSE, CAPILLARY: Glucose-Capillary: 109 mg/dL — ABNORMAL HIGH (ref 70–99)

## 2021-11-30 LAB — MAGNESIUM: Magnesium: 2 mg/dL (ref 1.7–2.4)

## 2021-11-30 LAB — HIV ANTIBODY (ROUTINE TESTING W REFLEX): HIV Screen 4th Generation wRfx: REACTIVE — AB

## 2021-11-30 MED ORDER — IOHEXOL 300 MG/ML  SOLN
75.0000 mL | Freq: Once | INTRAMUSCULAR | Status: AC | PRN
  Administered 2021-11-30: 75 mL via INTRAVENOUS
  Filled 2021-11-30: qty 75

## 2021-11-30 MED ORDER — SODIUM CHLORIDE 0.9 % IV SOLN
500.0000 mg | INTRAVENOUS | Status: DC
Start: 2021-11-30 — End: 2021-12-03
  Administered 2021-11-30 – 2021-12-02 (×3): 500 mg via INTRAVENOUS
  Filled 2021-11-30: qty 500
  Filled 2021-11-30 (×2): qty 5
  Filled 2021-11-30: qty 500

## 2021-11-30 MED ORDER — GUAIFENESIN-CODEINE 100-10 MG/5ML PO SOLN
5.0000 mL | Freq: Every day | ORAL | Status: DC | PRN
Start: 2021-11-30 — End: 2021-11-30

## 2021-11-30 MED ORDER — MONTELUKAST SODIUM 10 MG PO TABS
10.0000 mg | ORAL_TABLET | Freq: Every day | ORAL | Status: DC
  Administered 2021-11-30 – 2021-12-02 (×3): 10 mg via ORAL
  Filled 2021-11-30 (×3): qty 1

## 2021-11-30 MED ORDER — VENLAFAXINE HCL ER 75 MG PO CP24
225.0000 mg | ORAL_CAPSULE | Freq: Every day | ORAL | Status: DC
Start: 2021-11-30 — End: 2021-12-03
  Administered 2021-11-30 – 2021-12-03 (×4): 225 mg via ORAL
  Filled 2021-11-30: qty 1
  Filled 2021-11-30: qty 3
  Filled 2021-11-30: qty 1
  Filled 2021-11-30 (×2): qty 3

## 2021-11-30 MED ORDER — DORZOLAMIDE HCL-TIMOLOL MAL 2-0.5 % OP SOLN
1.0000 [drp] | Freq: Two times a day (BID) | OPHTHALMIC | Status: DC
  Administered 2021-11-30 – 2021-12-03 (×7): 1 [drp] via OPHTHALMIC
  Filled 2021-11-30: qty 10

## 2021-11-30 MED ORDER — KETOROLAC TROMETHAMINE 30 MG/ML IJ SOLN
15.0000 mg | Freq: Four times a day (QID) | INTRAMUSCULAR | Status: AC | PRN
  Administered 2021-11-30: 15 mg via INTRAVENOUS
  Filled 2021-11-30: qty 1

## 2021-11-30 MED ORDER — IPRATROPIUM-ALBUTEROL 0.5-2.5 (3) MG/3ML IN SOLN
3.0000 mL | RESPIRATORY_TRACT | Status: DC | PRN
  Administered 2021-11-30 – 2021-12-02 (×2): 3 mL via RESPIRATORY_TRACT
  Filled 2021-11-30 (×2): qty 3

## 2021-11-30 MED ORDER — MEGESTROL ACETATE 400 MG/10ML PO SUSP
625.0000 mg | Freq: Every day | ORAL | Status: DC
  Administered 2021-11-30 – 2021-12-01 (×2): 625 mg via ORAL
  Filled 2021-11-30 (×3): qty 20

## 2021-11-30 MED ORDER — SODIUM CHLORIDE 0.9 % IV SOLN
2.0000 g | Freq: Three times a day (TID) | INTRAVENOUS | Status: DC
  Administered 2021-11-30 – 2021-12-03 (×10): 2 g via INTRAVENOUS
  Filled 2021-11-30 (×12): qty 2

## 2021-11-30 MED ORDER — TAMSULOSIN HCL 0.4 MG PO CAPS
0.4000 mg | ORAL_CAPSULE | Freq: Every day | ORAL | Status: DC
  Administered 2021-11-30 – 2021-12-02 (×3): 0.4 mg via ORAL
  Filled 2021-11-30 (×5): qty 1

## 2021-11-30 MED ORDER — MOMETASONE FURO-FORMOTEROL FUM 200-5 MCG/ACT IN AERO
2.0000 | INHALATION_SPRAY | Freq: Two times a day (BID) | RESPIRATORY_TRACT | Status: DC
  Administered 2021-12-01 – 2021-12-03 (×5): 2 via RESPIRATORY_TRACT
  Filled 2021-11-30 (×2): qty 8.8

## 2021-11-30 MED ORDER — ALBUTEROL SULFATE (2.5 MG/3ML) 0.083% IN NEBU
2.5000 mg | INHALATION_SOLUTION | RESPIRATORY_TRACT | Status: DC | PRN
  Filled 2021-11-30: qty 3

## 2021-11-30 MED ORDER — LIDOCAINE 5 % EX PTCH
1.0000 | MEDICATED_PATCH | CUTANEOUS | Status: DC
  Administered 2021-11-30: 1 via TRANSDERMAL
  Filled 2021-11-30 (×2): qty 1

## 2021-11-30 MED ORDER — ONDANSETRON HCL 4 MG/2ML IJ SOLN
4.0000 mg | Freq: Four times a day (QID) | INTRAMUSCULAR | Status: DC | PRN
Start: 2021-11-30 — End: 2021-12-03
  Administered 2021-11-30 – 2021-12-03 (×4): 4 mg via INTRAVENOUS
  Filled 2021-11-30 (×5): qty 2

## 2021-11-30 MED ORDER — LORATADINE 10 MG PO TABS
10.0000 mg | ORAL_TABLET | Freq: Every day | ORAL | Status: DC
  Administered 2021-11-30 – 2021-12-03 (×4): 10 mg via ORAL
  Filled 2021-11-30 (×4): qty 1

## 2021-11-30 MED ORDER — LATANOPROST 0.005 % OP SOLN
1.0000 [drp] | Freq: Every day | OPHTHALMIC | Status: DC
  Administered 2021-11-30 – 2021-12-02 (×3): 1 [drp] via OPHTHALMIC
  Filled 2021-11-30: qty 2.5

## 2021-11-30 MED ORDER — BACLOFEN 10 MG PO TABS
5.0000 mg | ORAL_TABLET | Freq: Every evening | ORAL | Status: DC | PRN
  Filled 2021-11-30: qty 0.5

## 2021-11-30 MED ORDER — INSULIN ASPART 100 UNIT/ML IJ SOLN
0.0000 [IU] | Freq: Three times a day (TID) | INTRAMUSCULAR | Status: DC
  Administered 2021-11-30: 1 [IU] via SUBCUTANEOUS
  Administered 2021-11-30: 2 [IU] via SUBCUTANEOUS
  Administered 2021-11-30: 1 [IU] via SUBCUTANEOUS
  Administered 2021-12-01: 2 [IU] via SUBCUTANEOUS
  Administered 2021-12-01: 1 [IU] via SUBCUTANEOUS
  Administered 2021-12-01: 2 [IU] via SUBCUTANEOUS
  Administered 2021-12-02 (×3): 1 [IU] via SUBCUTANEOUS
  Administered 2021-12-03: 2 [IU] via SUBCUTANEOUS
  Administered 2021-12-03: 1 [IU] via SUBCUTANEOUS
  Filled 2021-11-30 (×11): qty 1

## 2021-11-30 MED ORDER — IPRATROPIUM-ALBUTEROL 0.5-2.5 (3) MG/3ML IN SOLN
3.0000 mL | Freq: Four times a day (QID) | RESPIRATORY_TRACT | Status: DC
  Administered 2021-11-30 – 2021-12-01 (×5): 3 mL via RESPIRATORY_TRACT
  Filled 2021-11-30 (×6): qty 3

## 2021-11-30 MED ORDER — TRAMADOL HCL 50 MG PO TABS
100.0000 mg | ORAL_TABLET | Freq: Four times a day (QID) | ORAL | Status: DC | PRN
  Filled 2021-11-30: qty 2

## 2021-11-30 MED ORDER — PREGABALIN 50 MG PO CAPS
50.0000 mg | ORAL_CAPSULE | Freq: Two times a day (BID) | ORAL | Status: DC
  Administered 2021-11-30 – 2021-12-03 (×7): 50 mg via ORAL
  Filled 2021-11-30: qty 2
  Filled 2021-11-30 (×6): qty 1

## 2021-11-30 MED ORDER — DM-GUAIFENESIN ER 30-600 MG PO TB12
1.0000 | ORAL_TABLET | Freq: Two times a day (BID) | ORAL | Status: DC
  Administered 2021-11-30 – 2021-12-03 (×6): 1 via ORAL
  Filled 2021-11-30 (×7): qty 1

## 2021-11-30 MED ORDER — HYDROCOD POLI-CHLORPHE POLI ER 10-8 MG/5ML PO SUER
5.0000 mL | Freq: Two times a day (BID) | ORAL | Status: DC | PRN
  Administered 2021-12-01 – 2021-12-02 (×3): 5 mL via ORAL
  Filled 2021-11-30 (×3): qty 5

## 2021-11-30 NOTE — ED Notes (Signed)
Called floor to notify no RN currently assigned to room 224 on chart.

## 2021-11-30 NOTE — Assessment & Plan Note (Addendum)
Due to PNA and lung cancer underlying. Presented with dyspnea, cough, chest xray with RUL airspace disease surrounding the known mass, with O2 sat in upper 80's on room air, needed 2-3L O2 --back on room air today --Treat PNA as outlined --Incentive spirometer

## 2021-11-30 NOTE — ED Notes (Signed)
RN informed bed assigned 

## 2021-11-30 NOTE — ED Notes (Signed)
Alert, NAD, calm, interactive, resps e/u, recent coughing spell, non-productive and congested. Sample explained. Returned to O2 [place] 2L. Wife at [facility].

## 2021-11-30 NOTE — Assessment & Plan Note (Addendum)
POA, resolved with replacement. Monitor and replace K as needed.

## 2021-11-30 NOTE — Assessment & Plan Note (Signed)
No acute issues.  Monitor.

## 2021-11-30 NOTE — Hospital Course (Signed)
Rodney Vega is a 66 y.o. 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  Reported intermittent nausea, no vomiting, attributed to chemo/immunotherapy.  Admitted for IV antibiotics after chest x-ray shows a large area of consolidation in the right upper lobe with central necrosis corresponding to known malignancy while showing increasing airspace opacity of the right upper lobe as well as diffuse interstitial nodularity involving the right lung, which is new, concerning for metastatic disease versus lymphangitis carcinomatosis versus pneumonia.  Patient met sepsis criteria on admission with leukocytosis, tachycardia, tachypnea in the setting of likely pneumonia.

## 2021-11-30 NOTE — Assessment & Plan Note (Signed)
Sliding scale Novolog.  Hold glipizide.

## 2021-11-30 NOTE — Assessment & Plan Note (Signed)
Due to PNA.  Mgmt as outlined. Monitor closely.

## 2021-11-30 NOTE — Assessment & Plan Note (Signed)
Continue Effexor

## 2021-11-30 NOTE — Assessment & Plan Note (Addendum)
Present on admission with leukocytosis, tachycardia, tachypnea in setting of PNA.  Organ dysfunction and severe sepsis evidenced by acute hypoxic respiratory failure.  Continue IV antibiotics for PNA. Currently off IV fluids.  Follow cultures.

## 2021-11-30 NOTE — Assessment & Plan Note (Signed)
Follows with Dr. Rogue Bussing, consulted. On immunotherapy.  Had chemo and XRT in the past.  Follow up oncology recommendations.  CT chest with contrast was planned for 3/1, ordered.

## 2021-11-30 NOTE — Assessment & Plan Note (Addendum)
Chronic. BP's stable, mildly soft at times. Hold home losartan for now

## 2021-11-30 NOTE — ED Notes (Signed)
Pt alert, NAD, calm, interactive, resps e/u, speakign in clear complete sentences, oxisensor changed, improved reading. IVF infusing, ABT complete. VSS. Speaking on phone with wife. Admits to some minor pain with cough.

## 2021-11-30 NOTE — Progress Notes (Signed)
°Progress Note ° ° °Patient: Rodney Vega MRN:[id number] DOB: 11/14/1955 DOA: 11/29/2021     1 °DOS: the patient was seen and examined on 11/30/2021 °  °Brief hospital course: °Ledger Derusha is a 66 y.o. 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  Reported intermittent nausea, no vomiting, attributed to chemo/immunotherapy. ° °Admitted for IV antibiotics after chest x-ray shows a large area of consolidation in the right upper lobe with central necrosis corresponding to known malignancy while showing increasing airspace opacity of the right upper lobe as well as diffuse interstitial nodularity involving the right lung, which is new, concerning for metastatic disease versus lymphangitis carcinomatosis versus pneumonia. ° °Patient met sepsis criteria on admission with leukocytosis, tachycardia, tachypnea in the setting of likely pneumonia. ° °Assessment and Plan: °* Severe sepsis (HCC)- (present on admission) °Present on admission with leukocytosis, tachycardia, tachypnea in setting of PNA.  Organ dysfunction and severe sepsis evidenced by acute hypoxic respiratory failure.  Continue IV antibiotics for PNA. °Continue IV fluids.  Follow cultures. ° °Acute respiratory failure with hypoxia (HCC)- (present on admission) °Due to PNA and lung cancer underlying. °Presented with dyspnea, cough, chest xray with RUL airspace disease surrounding the known mass, with O2 sat in upper 80's on room air. °--Supplement o2, keep sats above 90% °--Treat PNA as outlined °--Wean O2 as tolerated °--Incentive spirometer ° °SOB (shortness of breath)- (present on admission) °Due to PNA.  Mgmt as outlined. °Monitor closely. ° °CAP (community acquired pneumonia)- (present on admission) °POA, with underlying  RUL lung cancer.  °--Continue Cefepime and Zithromax °--Supportive care with mucolytics, o2 support PRN, antipyretics and analgesia as needed °--Sputum culture ° ° °Hypokalemia- (present on admission) °POA, replaced. °Monitor and replace K as needed. ° °Primary cancer of right upper lobe of lung (HCC)- (present on admission) °Follows with Dr. Brahmanday, consulted. °On immunotherapy.  Had chemo and XRT in the past.  Follow up oncology recommendations.  CT chest with contrast was planned for 3/1, ordered.  ° °Diabetes mellitus without complication (HCC) °Sliding scale Novolog.  Hold glipizide. ° °Hypertension- (present on admission) °Chronic. BP's stable, mildly soft at times. °Hold home losartan for now in setting of sepsis. ° °Depression- (present on admission) °Continue Effexor ° °Colostomy in place (HCC) °No acute issues.  Monitor. ° ° ° ° ° ° ° °Subjective: Pt seen in ED holding for a bed, wife at bedside.  He reports severe rib cage pain with coughing, limiting his ability to cough up phlegm.  Using tramadol at home for pain control.  Feels weak and fatigued.  Megace only started recently but states it seems to help appetite a little.  ° °Physical Exam: °Vitals:  ° 11/30/21 1408 11/30/21 1422 11/30/21 1425 11/30/21 1615  °BP:      °Pulse: 93   (!) 102  °Resp: 16   (!) 22  °Temp:      °TempSrc:      °SpO2: 100% (!) 89% 93% 96%  °Weight:      °Height:      ° °General exam: awake, alert, no acute distress, ill-appearing °HEENT: moist mucus membranes, hearing grossly normal  °Respiratory system: right-sided rhonchi, no wheezes, tachypneic, mildly increased respiratory effort at rest, on 2 L/min [place] O2. °Cardiovascular system: normal S1/S2, RRR, no JVD, murmurs, rubs, gallops,   gallops, no pedal edema.   Gastrointestinal system: soft, NT, ND, no HSM felt, +bowel sounds. Central nervous system: A&O x3. no gross focal neurologic deficits, normal speech Extremities: moves all, no edema, normal tone Skin: dry, intact, no  rashes seen Psychiatry: normal mood, congruent affect, judgement and insight appear normal   Data Reviewed:  Labs notable for K 3.1, glucose 185, albumin 2.3, lactic acid 1.0, procal 0.41, WBC 12.9 improved, Hbg 7.3, UA with no infection    Family Communication: wife at bedside on rounds  Disposition: Status is: Inpatient Remains inpatient appropriate because: severity of illness on IV antibiotics, ongoing hypoxic respiratory failure          Planned Discharge Destination: Home     Time spent: 40 minutes  Author: Ezekiel Slocumb, DO 11/30/2021 4:53 PM  For on call review [email or web address].

## 2021-11-30 NOTE — Assessment & Plan Note (Signed)
POA, with underlying RUL lung cancer.  --Continue Cefepime and Zithromax --Supportive care with mucolytics, o2 support PRN, antipyretics and analgesia as needed --Sputum culture

## 2021-11-30 NOTE — Consult Note (Signed)
Pharmacy Antibiotic Note  Malosi Hemstreet is a 66 y.o. male admitted on 11/29/2021 with pneumonia and sepsis.  Pharmacy has been consulted for Cefepime dosing. Patient is also receiving Azithromycin.  Plan: Cefepime 2g IV Q12 hours  Height: 5\' 11"  (180.3 cm) Weight: 83.5 kg (184 lb) IBW/kg (Calculated) : 75.3  Temp (24hrs), Avg:99 F (37.2 C), Min:99 F (37.2 C), Max:99 F (37.2 C)  Recent Labs  Lab 11/24/21 1005 11/26/21 0922 11/29/21 2113 11/29/21 2120  WBC 11.5* 9.7 15.9*  --   CREATININE 0.89 0.82 0.71  --   LATICACIDVEN  --   --   --  1.0    Estimated Creatinine Clearance: 96.7 mL/min (by C-G formula based on SCr of 0.71 mg/dL).    Allergies  Allergen Reactions   Paclitaxel Shortness Of Breath   Ambien [Zolpidem]     Causes confusion    Nsaids     Abdominal pain   Aleve [Naproxen Sodium] Itching    Antimicrobials this admission: Cefepime 2/26 >>  Azithromycin 2/26 >>   Microbiology results: 2/26 BCx: pending 2/26 UCx: pending   Thank you for allowing pharmacy to be a part of this patients care.  Kaylean Tupou A Eldredge Veldhuizen 11/30/2021 12:07 AM

## 2021-11-30 NOTE — H&P (Signed)
History and Physical    PLEASE NOTE THAT DRAGON DICTATION SOFTWARE WAS USED IN THE CONSTRUCTION OF THIS NOTE.   Rodney Vega HQI:[id number] DOB: [birth year] DOA: 11/29/2021  PCP: [name], MD  Patient coming from: home   I have personally briefly reviewed patient's old medical records in [place]  Chief Complaint: Shortness of breath  HPI: Rodney Vega is a 66 y.o. 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   The patient reports 2 days of progressive shortness of breath associate with significant worsening of his baseline cough, noting that it is now much more productive over the last 2 days and associated with green/yellow appearing sputum, which is also new for him.  He also notes associated subjective fever in the absence of chills, full body rigors, or generalized myalgias.  Denies any associated orthopnea, PND, or new onset peripheral edema. No recent chest pain, diaphoresis, palpitations, pre-syncope, or syncope. Not associated with any wheezing, hemoptysis, new lower extremity erythema, or calf tenderness.   He reports intermittent nausea in the absence of vomiting, which she reports is common for him in the setting of his immunotherapy/chemotherapy for his known right upper lobe lung cancer, which, per chart review, it appears to be small cell in nature.  Denies any associated abdominal pain, rash.  No recent dysuria or gross hematuria.  Denies neck stiffness, headache.  Denies any known history of underlying COPD, or reporting that he is a current smoker, having smoked 1 pack/day for at least the last 30 years.  Denies any known baseline supplemental oxygen  requirements.  As a relates to his right upper lobe lung cancer, the patient follows with Dr.  Rogue Bussing of oncology. In the setting of his immunotherapy, he has reportedly become intermittently dehydrated, and was reportedly scheduled to undergo a liter of IV fluids at the transfusion center on 11/30/2021.     ED Course:  Vital signs in the ED were notable for the following: Temperature max 99.0; heart rate 10 6-1 18; blood pressure 114/71 - 120/66; respiratory rate 18-24; initial oxygen saturation 89% on room air, with subsequent increase to92-94 % on 2 L nasal cannula.  Labs were notable for the following: VBG notable for the following: 7.48/39.  CMP notable for the following: Potassium 3.1, bicarbonate 26, creatinine 0.71 compared to most recent prior creatinine did 1-0.82 on 11/26/2021, and liver enzymes were found to be within normal limits.  High-sensitivity troponin I initially noted to be 10, with repeat value trending down to 9.  CBC notable for white blood cell count 15,900 with 87% neutrophils, relative to most recent prior white blood cell count of 9700 on 11/26/2021, hemoglobin 7.7 associated with normocytic/normochromic findings, and compared to most recent prior hemoglobin value of 8.3 on 11/26/2021.  Lactic acid 1.0.  Urinalysis has been ordered, with result currently pending.  COVID-19/influenza PCR results pending.  Blood cultures x2 were collected prior to initiation of IV antibiotics.  Imaging and additional notable ED work-up: EKG shows sinus tachycardia with heart rate 114, normal intervals, and no evidence of T wave or ST changes, including no evidence of ST elevation.  Chest x-ray shows a large area of consolidation in the right upper lobe with central necrosis corresponding  to known malignancy while showing increasing airspace opacity of the right upper lobe as well as diffuse interstitial nodularity involving the right lung, which is new, concerning for metastatic disease versus  lymphangitis carcinomatosis versus pneumonia, in the absence of any evidence of pleural effusion, pulmonary edema, or pneumothorax.  While in the ED, the following were administered: Cefepime, azithromycin.  Subsequently, the patient was admitted for further evaluation management of presenting severe sepsis due to suspected community-acquired pneumonia, complicated by acute hypoxic respiratory distress, with presentation also notable for hypokalemia.    Review of Systems: As per HPI otherwise 10 point review of systems negative.   Past Medical History:  Diagnosis Date   Cancer ([place])    Degenerative arthritis of knee    Depression    Diabetes mellitus without complication ([place])    Diverticulitis    Glaucoma    since 2004   Hernia 1979   History of kidney stones    Hypertension    Personal history of tobacco use, presenting hazards to health    Pre-diabetes    Screening for obesity    Special screening for malignant neoplasms, colon    Wears dentures    full upper and lower    Past Surgical History:  Procedure Laterality Date   [place], 2012   lumbar bulging disc   CATARACT EXTRACTION W/PHACO Left 01/20/2021   Procedure: CATARACT EXTRACTION PHACO AND INTRAOCULAR LENS PLACEMENT ([place]) LEFT;  Surgeon: Birder Robson, MD;  Location: [place];  Service: Ophthalmology;  Laterality: Left;  10.13 0:52.1   COLONOSCOPY  [id number]   [facility]/Dr. Buelah Manis sessile polyp,18mm, found in rectum,multiple diverticula were found in the sigmoid, in descending and in transverse colon   COLONOSCOPY WITH PROPOFOL N/A 10/22/2021   Procedure: COLONOSCOPY WITH PROPOFOL;  Surgeon: Annamaria Helling, DO;  Location: [facility] ENDOSCOPY;  Service: Gastroenterology;  Laterality: N/A;  DM   COLOSTOMY  04-20-13   HERNIA REPAIR  1979   inguinal   IR IMAGING GUIDED PORT INSERTION  06/10/2021   JOINT REPLACEMENT Right    knee   KNEE ARTHROPLASTY Right 05/08/2018   Procedure: COMPUTER ASSISTED  TOTAL KNEE ARTHROPLASTY;  Surgeon: Dereck Leep, MD;  Location: [facility] ORS;  Service: Orthopedics;  Laterality: Right;   KNEE ARTHROPLASTY Left 11/16/2019   Procedure: COMPUTER ASSISTED TOTAL KNEE ARTHROPLASTY;  Surgeon: Dereck Leep, MD;  Location: [facility] ORS;  Service: Orthopedics;  Laterality: Left;   LAPAROTOMY  04-20-13   colon resection with colosotmy   LITHOTRIPSY  2013   [place]    Social History:  reports that he has been smoking cigarettes. He has a 30.00 pack-year smoking history. He has never used smokeless tobacco. He reports current alcohol use of about 2.0 standard drinks per week. He reports current drug use. Drug: Marijuana.   Allergies  Allergen Reactions   Paclitaxel Shortness Of Breath   Ambien [Zolpidem]     Causes confusion    Nsaids     Abdominal pain   Aleve [Naproxen Sodium] Itching    Family History  Problem Relation Age of Onset   Diabetes Mother    Heart disease Mother    Heart attack Father    Prostate cancer Father     Family history reviewed and not pertinent    Prior to Admission medications   Medication Sig Start Date End Date Taking? Authorizing Provider  acetaminophen (TYLENOL) 500 MG tablet Take 1,000 mg by mouth daily.   Yes  [provider]  albuterol (ACCUNEB) 0.63 MG/3ML nebulizer solution Take 3 mLs (0.63 mg total) by nebulization every 4 (four) hours as needed. Per pharmacy 0.83% dose available (this dose not available in Epic).  This dose was called in to [facility] 11/03/21  Yes Brahmanday, Elisha Headland, MD  albuterol (VENTOLIN HFA) 108 (90 Base) MCG/ACT inhaler Inhale 2 puffs into the lungs every 6 (six) hours as needed for wheezing or shortness of breath. 06/03/21  Yes [name], MD  baclofen (LIORESAL) 10 MG tablet Take 5 mg by mouth at bedtime as needed for muscle spasms.   Yes [provider]  blood glucose meter kit and supplies KIT Check your blood glucose levels  twice a day; once in the morning before breakfast; and once in the evening after dinner. 06/29/21  Yes [name], MD  cetirizine (ZYRTEC) 10 MG tablet Take 10 mg by mouth daily.   Yes [provider]  Cyanocobalamin (VITAMIN B-12 PO) Take by mouth daily.   Yes [provider]  diphenhydramine-acetaminophen (TYLENOL PM) 25-500 MG TABS Take 1-2 tablets by mouth at bedtime as needed (sleep.).    Yes [provider]  dorzolamide-timolol (COSOPT) 22.3-6.8 MG/ML ophthalmic solution Place 1 drop into both eyes 2 (two) times daily.   Yes [provider]  fluticasone-salmeterol (ADVAIR DISKUS) 500-50 MCG/ACT AEPB Inhale 1 puff into the lungs in the morning and at bedtime. 09/11/21  Yes [name], MD  glipiZIDE (GLUCOTROL) 5 MG tablet Take 1 tablet (5 mg total) by mouth 2 (two) times daily before a meal. 09/11/21  Yes Charlaine Dalton R, MD  glucose blood test strip Check your blood glucose levels twice a day; once in the morning before breakfast; and once in the evening after dinner. 06/29/21  Yes [name], MD  guaiFENesin-codeine 100-10 MG/5ML syrup Take 5 mLs by mouth daily as needed for cough. 11/02/21  Yes Charlaine Dalton R, MD  ipratropium (ATROVENT) 0.02 % nebulizer solution Take 2.5 mLs (0.5 mg total) by nebulization 4 (four) times daily. 11/03/21  Yes [name], MD  ipratropium-albuterol (DUONEB) 0.5-2.5 (3) MG/3ML SOLN Take 3 mLs by nebulization every 4 (four) hours as needed. 11/02/21  Yes [name], MD  Lancets (FREESTYLE) lancets Check your blood glucose levels twice a day; once in the morning before breakfast; and once in the evening after dinner. 06/29/21  Yes [name], MD  latanoprost (XALATAN) 0.005 % ophthalmic solution Place 1 drop into both eyes at bedtime.   Yes [provider]  megestrol (MEGACE ES) 625 MG/5ML suspension Take 5 mLs (625 mg total) by mouth daily. 11/26/21  Yes  Borders, Kirt Boys, NP  mirtazapine (REMERON) 7.5 MG tablet Take 7.5 mg by mouth at bedtime. For appetite stimulation   Yes [provider]  montelukast (SINGULAIR) 10 MG tablet TAKE 1 TABLET(10 MG) BY MOUTH AT BEDTIME 08/24/21  Yes [name], MD  olmesartan (BENICAR) 40 MG tablet Take 40 mg by mouth daily.   Yes [provider]  pregabalin (LYRICA) 50 MG capsule Take 1 capsule (50 mg total) by mouth 2 (two) times daily. 08/07/21  Yes [name], MD  Respiratory Therapy Supplies (NEBULIZER) DEVI Use as directed 11/02/21  Yes [name], MD  tamsulosin (FLOMAX) 0.4 MG CAPS capsule Take 0.4 mg by mouth daily.   Yes [provider]  traMADol (ULTRAM) 50 MG tablet Take 2 tablets (100 mg total) by mouth every 6 (  six) hours as needed. 11/27/21  Yes Borders, Kirt Boys, NP  venlafaxine XR (EFFEXOR-XR) 75 MG 24 hr capsule Take 225 mg by mouth daily. 09/24/19  Yes [provider]  sucralfate (CARAFATE) 1 g tablet Take 1 tablet (1 g total) by mouth 3 (three) times daily. Dissolve in 3-4 tbsp warm water, swish and swallow 07/23/21   Noreene Filbert, MD  prochlorperazine (COMPAZINE) 10 MG tablet TAKE 1 TABLET(10 MG) BY MOUTH EVERY 6 HOURS AS NEEDED FOR NAUSEA OR VOMITING Patient not taking: Reported on 08/17/2021 07/09/21 09/11/21  [name], MD     Objective    Physical Exam: Vitals:   11/29/21 2042 11/29/21 2043 11/29/21 2200 11/29/21 2230  BP: 120/61  120/66 114/71  Pulse: (!) 118  (!) 106 (!) 107  Resp: (!) $RemoveB'24  18 20  'DahSSjFg$ Temp: 99 F (37.2 C)     TempSrc: Oral     SpO2: (!) 89%  92% 91%  Weight:  83.5 kg    Height:  $Remove'5\' 11"'sWbLegD$  (1.803 m)      General: appears to be stated age; alert, oriented #mildly increased work of breathing Skin: warm, dry, no rash Head:  AT/[place] Mouth:  Oral mucosa membranes appear moist, normal dentition Neck: supple; trachea midline Heart:  RRR; did not appreciate any M/R/G Lungs: CTAB, did not  appreciate any wheezes, rales, or rhonchi Abdomen: + BS; soft, ND, NT Vascular: 2+ pedal pulses b/l; 2+ radial pulses b/l Extremities: no peripheral edema, no muscle wasting Neuro: strength and sensation intact in upper and lower extremities b/l      Labs on Admission: I have personally reviewed following labs and imaging studies  CBC: Recent Labs  Lab 11/24/21 1005 11/26/21 0922 11/29/21 2113  WBC 11.5* 9.7 15.9*  NEUTROABS 9.5* 8.0* 14.0*  HGB 8.5* 8.3* 7.7*  HCT 27.0* 27.2* 24.5*  MCV 89.7 90.1 89.4  PLT 192 215 754   Basic Metabolic Panel: Recent Labs  Lab 11/24/21 1005 11/26/21 0922 11/29/21 2113 11/29/21 2257  NA 132* 131* 135  --   K 3.2* 3.4* 3.1*  --   CL 97* 99 99  --   CO2 $Re'26 27 26  'qUL$ --   GLUCOSE 247* 275* 185*  --   BUN 11 11 7*  --   CREATININE 0.89 0.82 0.71  --   CALCIUM 8.5* 8.5* 8.5*  --   MG  --   --   --  2.0   GFR: Estimated Creatinine Clearance: 96.7 mL/min (by C-G formula based on SCr of 0.71 mg/dL). Liver Function Tests: Recent Labs  Lab 11/24/21 1005 11/26/21 0922 11/29/21 2113  AST $Re'18 20 15  'zLo$ ALT $R'13 15 15  'jU$ ALKPHOS 102 105 99  BILITOT 0.3 0.4 0.3  PROT 7.2 7.2 6.4*  ALBUMIN 2.8* 2.7* 2.3*   No results for input(s): LIPASE, AMYLASE in the last 168 hours. No results for input(s): AMMONIA in the last 168 hours. Coagulation Profile: Recent Labs  Lab 11/29/21 2113  INR 1.3*   Cardiac Enzymes: No results for input(s): CKTOTAL, CKMB, CKMBINDEX, TROPONINI in the last 168 hours. BNP (last 3 results) No results for input(s): PROBNP in the last 8760 hours. HbA1C: No results for input(s): HGBA1C in the last 72 hours. CBG: No results for input(s): GLUCAP in the last 168 hours. Lipid Profile: No results for input(s): CHOL, HDL, LDLCALC, TRIG, CHOLHDL, LDLDIRECT in the last 72 hours. Thyroid Function Tests: No results for input(s): TSH, T4TOTAL, FREET4, T3FREE, THYROIDAB in the last  72 hours. Anemia Panel: No results for input(s):  VITAMINB12, FOLATE, FERRITIN, TIBC, IRON, RETICCTPCT in the last 72 hours. Urine analysis:    Component Value Date/Time   COLORURINE YELLOW 09/02/2021 1500   APPEARANCEUR CLEAR 09/02/2021 1500   APPEARANCEUR Cloudy 01/08/2012 1314   LABSPEC 1.025 09/02/2021 1500   LABSPEC 1.030 01/08/2012 1314   PHURINE 7.0 09/02/2021 1500   GLUCOSEU 500 (A) 09/02/2021 1500   GLUCOSEU 150 mg/dL 01/08/2012 1314   HGBUR SMALL (A) 09/02/2021 1500   BILIRUBINUR NEGATIVE 09/02/2021 1500   BILIRUBINUR Negative 01/08/2012 1314   KETONESUR NEGATIVE 09/02/2021 1500   PROTEINUR NEGATIVE 09/02/2021 1500   NITRITE NEGATIVE 09/02/2021 1500   LEUKOCYTESUR NEGATIVE 09/02/2021 1500   LEUKOCYTESUR Trace 01/08/2012 1314    Radiological Exams on Admission: DG Chest 1 View  Result Date: 11/29/2021 CLINICAL DATA:  Shortness of breath. Metastatic non-small cell lung cancer. EXAM: CHEST  1 VIEW COMPARISON:  Chest radiograph dated 09/18/2021 and CT dated 09/09/2021 FINDINGS: Left-sided Port-A-Cath in similar position. Large area of consolidation in the right upper lobe/right apex with central necrosis corresponding to the known malignancy. Overall increase in the opacity with decreased aeration of the right upper lobe compared to prior radiograph. Additionally there is diffuse interstitial nodularity primarily involving the right lung which is new compared to prior radiograph suspicious for metastatic disease or lymphangitic carcinomatosis. Atypical pneumonia is less likely but not excluded. No large pleural effusion. No pneumothorax. Stable cardiomediastinal silhouette. No acute osseous pathology. IMPRESSION: Progression of right upper lobe centrally necrotic mass with findings suggestive of pulmonary metastatic disease or lymphangitic carcinomatosis, new since the prior radiograph. Electronically Signed   By: Anner Crete M.D.   On: 11/29/2021 21:38     EKG: Independently reviewed, with result as described above.     Assessment/Plan    Principal Problem:   Severe sepsis ([place]) Active Problems:   Diabetes mellitus without complication ([place])   Hypertension   CAP (community acquired pneumonia)   SOB (shortness of breath)   Hypokalemia   Depression      #) Severe sepsis due to community-acquired pneumonia: Diagnosis on the basis of 2 days of progressive shortness of breath, worsening productive cough, subjective fever, associated acute hypoxic respiratory distress, with chest x-ray showing interval increase in airspace opacity in the right upper lobe as well as interval increase in interstitial nodularity, with differential including pneumonia.  Clinically, the above symptoms appear consistent with underlying infectious process, and will therefore treat for community-acquired pneumonia, as further detailed below.   SIRS criteria met via presenting leukocytosis, tachycardia, tachypnea. Lactic acid level: Nonelevated at 1.0. Of note, given the associated presence of suspected end organ damage in the form of concominant presenting acute hypoxic respiratory distress, criteria are met for pt's sepsis to be considered severe in nature. However, in the absence of lactic acid level that is greater than or equal to 4.0, and in the absence of any associated hypotension refractory to IVF's, there are no indications for administration of a 30 mL/kg IVF bolus at this time.   Additional ED work-up/management notable for: Collection of blood cultures x2 prior to initiation of cefepime and azithromycin.  Additionally, urinalysis has been ordered, with result currently pending.  COVID-19/influenza PCR results currently pending.  While criteria appear to be met for community-acquired pneumonia, will expand spectrum of antibiotic coverage to include continuation of cefepime given presence of severe sepsis as well as immunocompromise station in the setting of lung cancer undergoing immunotherapy.  Plan: CBC w/ diff in  AM.  Follow for results of blood cx's x 2. Abx: Continue cefepime and azithromycin, as above.  Check sputum culture.  Lactated Ringer's at 100 cc/h x 10 hours.  Add on procalcitonin level.  Check strep pneumoniae urine antigen.  Check mycoplasma antibodies.  Flutter valve, incentive spirometry.  Follow-up result urinalysis as well as COVID-19/influenza PCR.       #) Acute hypoxic respiratory distress: in the context of acute respiratory symptoms and no known baseline supplemental O2 requirements, presenting O2 sat in the high 80s on room air, subsequent improving into the range of 92 to 94% on 2 L nasal cannula, thereby meeting criteria for acute hypoxic respiratory distress as opposed to acute hypoxic respiratory failure at this time. Appears to be on basis of suspected community-acquired pneumonia, as above, superimposed on known right upper lobe lung cancer.  Will the patient denies any known history of underlying COPD, there may be an element of mild underlying obstructive lung disease given his long smoking history.  Consequently, we will also provide scheduled duo nebulizers as well as prn albuterol nebulizer.   In terms of other considered etiologies, ACS appears less likely at this time in the absence of any recent CP and in the context of negative troponin and presenting EKG showing no e/o acute ischemic process. No clinical or radiographic evidence to suggest acutely decompensated heart failure at this time, while chest x-ray also shows no evidence of pneumothorax.  COVID-19/Influenza PCR currently pending.   Plan: further evaluation/management of presenting severe sepsis due to suspected community-acquired pneumonia, as above. Monitor continuous pulse ox with prn supplemental O2 to maintain O2 sats greater than or equal to 92%. monitor on telemetry. CMP/CBC in the AM. Check serum Mg and Phos levels.  Flutter valve, incentive spirometry.  Scheduled duo nebulizer treatments, prn albuterol  nebulizers.  Follow for result of procalcitonin level.  Follow for COVID-19/influenza PCR.       #) Hypokalemia: Presenting serum potassium noted to be 3.1.  This appears to be in the context of decline in oral intake as a consequence of intermittent nausea in the setting of undergoing immunotherapy for underlying right upper lobe lung cancer.  Plan: Potassium chloride 40 mill equivalents p.o. x1 dose now as well as lactated Ringer's on a continuous basis, as above.  Add on serum magnesium level.  Repeat CMP in the morning.  Prn IV Zofran.       #) Depression: Documented history of such, on Effexor as an outpatient.  Plan: Continue home Effexor.        #) Type 2 Diabetes Mellitus: documented history of such. Home insulin regimen: None. Home oral hypoglycemic agents: Glipizide. presenting blood sugar: 185.   Plan: accuchecks QAC and HS with low dose SSI. hold home oral hypoglycemic agent during this hospitalization.         #) Essential Hypertension: documented h/o such, with outpatient antihypertensive regimen including olmesartan.  SBP's in the ED today: Normotensive.  However, in the setting of presenting severe sepsis, will hold home antihypertensive medications for now.  Plan: Close monitoring of subsequent BP via routine VS. hold home losartan for now, as above.         #) Allergic Rhinitis: documented h/o such, on scheduled Zyrtec as outpatient.    Plan: cont home Zyrtec.          #) Benign Prostatic Hyperplasia:  documented h/o such; on tamsulosin as outpatient.  in setting of presenting sepsis,  will hold tamsulosin for now due to the potential for peripheral vasodilatory consequences and increased risk for hypotension.    Plan: monitor strict I's & O's and daily weights. Repeat BMP in AM.  Hold home tamsulosin for now.        #) Anemia of chronic disease: Documented history of chronic anemia with baseline hemoglobin 7-9, with presenting  hemoglobin consistent with this range, in the absence of any evidence to suggest active bleed.  INR 1.3.  Associated normocytic/normochromic findings.  Consistent with anemia of chronic disease.  Plan: Repeat CBC in the morning.        DVT prophylaxis: SCD's   Code Status: Full code Family Communication: Case discussed with the patient's wife Disposition Plan: Per Rounding Team Consults called: none;  Admission status: Inpatient   PLEASE NOTE THAT DRAGON DICTATION SOFTWARE WAS USED IN THE CONSTRUCTION OF THIS NOTE.   Winfred DO [facility] [place]   11/30/2021, 12:13 AM

## 2021-12-01 ENCOUNTER — Other Ambulatory Visit: Payer: Self-pay | Admitting: Internal Medicine

## 2021-12-01 DIAGNOSIS — R638 Other symptoms and signs concerning food and fluid intake: Secondary | ICD-10-CM

## 2021-12-01 DIAGNOSIS — A419 Sepsis, unspecified organism: Secondary | ICD-10-CM | POA: Diagnosis not present

## 2021-12-01 DIAGNOSIS — E440 Moderate protein-calorie malnutrition: Secondary | ICD-10-CM | POA: Insufficient documentation

## 2021-12-01 DIAGNOSIS — R6520 Severe sepsis without septic shock: Secondary | ICD-10-CM | POA: Diagnosis not present

## 2021-12-01 LAB — CBC
HCT: 24.1 % — ABNORMAL LOW (ref 39.0–52.0)
Hemoglobin: 7.3 g/dL — ABNORMAL LOW (ref 13.0–17.0)
MCH: 27 pg (ref 26.0–34.0)
MCHC: 30.3 g/dL (ref 30.0–36.0)
MCV: 89.3 fL (ref 80.0–100.0)
Platelets: 211 10*3/uL (ref 150–400)
RBC: 2.7 MIL/uL — ABNORMAL LOW (ref 4.22–5.81)
RDW: 17.3 % — ABNORMAL HIGH (ref 11.5–15.5)
WBC: 11.9 10*3/uL — ABNORMAL HIGH (ref 4.0–10.5)
nRBC: 0 % (ref 0.0–0.2)

## 2021-12-01 LAB — BASIC METABOLIC PANEL
Anion gap: 8 (ref 5–15)
BUN: 12 mg/dL (ref 8–23)
CO2: 26 mmol/L (ref 22–32)
Calcium: 8.6 mg/dL — ABNORMAL LOW (ref 8.9–10.3)
Chloride: 107 mmol/L (ref 98–111)
Creatinine, Ser: 0.7 mg/dL (ref 0.61–1.24)
GFR, Estimated: >60 mL/min (ref >60)
Glucose, Bld: 129 mg/dL — ABNORMAL HIGH (ref 70–99)
Potassium: 3.7 mmol/L (ref 3.5–5.1)
Sodium: 141 mmol/L (ref 135–145)

## 2021-12-01 LAB — COMPREHENSIVE METABOLIC PANEL
ALT: 15 U/L (ref 0–44)
AST: 15 U/L (ref 15–41)
Albumin: 2.3 g/dL — ABNORMAL LOW (ref 3.5–5.0)
Alkaline Phosphatase: 99 U/L (ref 38–126)
Anion gap: 10 (ref 5–15)
BUN: 7 mg/dL — ABNORMAL LOW (ref 8–23)
CO2: 26 mmol/L (ref 22–32)
Calcium: 8.5 mg/dL — ABNORMAL LOW (ref 8.9–10.3)
Chloride: 99 mmol/L (ref 98–111)
Creatinine, Ser: 0.71 mg/dL (ref 0.61–1.24)
GFR, Estimated: >60 mL/min (ref >60)
Glucose, Bld: 228 mg/dL — ABNORMAL HIGH (ref 70–99)
Potassium: 3.1 mmol/L — ABNORMAL LOW (ref 3.5–5.1)
Sodium: 135 mmol/L (ref 135–145)
Total Bilirubin: 0.3 mg/dL (ref 0.3–1.2)
Total Protein: 6.4 g/dL — ABNORMAL LOW (ref 6.5–8.1)

## 2021-12-01 LAB — HEPATITIS A ANTIBODY, TOTAL: hep A Total Ab: NONREACTIVE

## 2021-12-01 LAB — GLUCOSE, CAPILLARY
Glucose-Capillary: 143 mg/dL — ABNORMAL HIGH (ref 70–99)
Glucose-Capillary: 156 mg/dL — ABNORMAL HIGH (ref 70–99)
Glucose-Capillary: 154 mg/dL — ABNORMAL HIGH (ref 70–99)
Glucose-Capillary: 175 mg/dL — ABNORMAL HIGH (ref 70–99)

## 2021-12-01 LAB — HEPATITIS B SURFACE ANTIGEN: Hepatitis B Surface Ag: NONREACTIVE

## 2021-12-01 LAB — URINE CULTURE: Culture: NO GROWTH

## 2021-12-01 LAB — HEPATITIS C ANTIBODY: HCV Ab: NONREACTIVE

## 2021-12-01 LAB — MYCOPLASMA PNEUMONIAE ANTIBODY, IGM: Mycoplasma pneumo IgM: <770 U/mL (ref 0–769)

## 2021-12-01 MED ORDER — ADULT MULTIVITAMIN W/MINERALS CH
1.0000 | ORAL_TABLET | Freq: Every day | ORAL | Status: DC
Start: 2021-12-02 — End: 2021-12-03
  Administered 2021-12-02 – 2021-12-03 (×2): 1 via ORAL
  Filled 2021-12-01 (×2): qty 1

## 2021-12-01 MED ORDER — LEVALBUTEROL HCL 1.25 MG/0.5ML IN NEBU
1.2500 mg | INHALATION_SOLUTION | Freq: Four times a day (QID) | RESPIRATORY_TRACT | Status: DC
  Administered 2021-12-01 – 2021-12-02 (×4): 1.25 mg via RESPIRATORY_TRACT
  Filled 2021-12-01 (×5): qty 0.5

## 2021-12-01 MED ORDER — ENSURE ENLIVE PO LIQD
237.0000 mL | Freq: Three times a day (TID) | ORAL | Status: DC
  Administered 2021-12-02 – 2021-12-03 (×3): 237 mL via ORAL

## 2021-12-01 MED ORDER — DRONABINOL 2.5 MG PO CAPS
5.0000 mg | ORAL_CAPSULE | Freq: Two times a day (BID) | ORAL | Status: DC
  Administered 2021-12-01 – 2021-12-03 (×3): 5 mg via ORAL
  Filled 2021-12-01 (×2): qty 2

## 2021-12-01 MED ORDER — DRONABINOL 5 MG PO CAPS: 5.0000 mg | ORAL_CAPSULE | Freq: Two times a day (BID) | ORAL | Status: DC

## 2021-12-01 NOTE — Consult Note (Signed)
[facility] CONSULT NOTE  Patient Care Team: [name], MD as PCP - General (Family Medicine) [name], RN as Oncology Nurse Navigator [name], MD as Consulting Physician (Urology) [name], MD as Consulting Physician (Oncology)  CHIEF COMPLAINTS/PURPOSE OF CONSULTATION: Lung cancer  HISTORY OF PRESENTING ILLNESS:  Rodney Vega 66 y.o.  male with longstanding history of smoking; non-small cell lung cancer right upper lobe stage III status post chemoradiation; diabetes; right-sided colon cancer managed conservatively is currently admitted to hospital for progressive shortness of breath.  Also noted to have increased sputum.  Notes to have subjective fever with worsening myalgias.  Noted to have wheezing.  On evaluation in the hospital patient-concern of sepsis with respiratory failure; needing supplemental oxygen; leukocytosis; anemia hemoglobin 7.2.  Patient is currently on IV antibiotics.  With regards to lung cancer stage III status post chemoradiation; chemotherapy is currently on hold because of patient's poor tolerance to therapy. Patient had been getting IV fluids/steroids as needed the last few weeks for his overall fatigue.     Review of Systems  Constitutional:  Positive for fever, malaise/fatigue and weight loss. Negative for chills and diaphoresis.  HENT:  Negative for nosebleeds and sore throat.   Eyes:  Negative for double vision.  Respiratory:  Positive for cough, sputum production, shortness of breath and wheezing. Negative for hemoptysis.   Cardiovascular:  Negative for chest pain, palpitations, orthopnea and leg swelling.  Gastrointestinal:  Negative for abdominal pain, blood in stool, constipation, diarrhea, heartburn, melena, nausea and vomiting.  Genitourinary:  Negative for dysuria, frequency and urgency.  Musculoskeletal:  Positive for back pain and joint pain.  Skin: Negative.  Negative for itching and rash.   Neurological:  Positive for dizziness. Negative for tingling, focal weakness, weakness and headaches.  Endo/Heme/Allergies:  Does not bruise/bleed easily.  Psychiatric/Behavioral:  Negative for depression. The patient is not nervous/anxious and does not have insomnia.     MEDICAL HISTORY:  Past Medical History:  Diagnosis Date   Cancer ([place])    Degenerative arthritis of knee    Depression    Diabetes mellitus without complication ([place])    Diverticulitis    Glaucoma    since 2004   Hernia 1979   History of kidney stones    Hypertension    Personal history of tobacco use, presenting hazards to health    Pre-diabetes    Screening for obesity    Special screening for malignant neoplasms, colon    Wears dentures    full upper and lower    SURGICAL HISTORY: Past Surgical History:  Procedure Laterality Date   [place], 2012   lumbar bulging disc   CATARACT EXTRACTION W/PHACO Left 01/20/2021   Procedure: CATARACT EXTRACTION PHACO AND INTRAOCULAR LENS PLACEMENT ([place]) LEFT;  Surgeon: Birder Robson, MD;  Location: [place];  Service: Ophthalmology;  Laterality: Left;  10.13 0:52.1   COLONOSCOPY  [id number]   [facility]/Dr. Buelah Manis sessile polyp,92mm, found in rectum,multiple diverticula were found in the sigmoid, in descending and in transverse colon   COLONOSCOPY WITH PROPOFOL N/A 10/22/2021   Procedure: COLONOSCOPY WITH PROPOFOL;  Surgeon: Annamaria Helling, DO;  Location: [facility];  Service: Gastroenterology;  Laterality: N/A;  DM   COLOSTOMY  04-20-13   HERNIA REPAIR  1979   inguinal   IR IMAGING GUIDED PORT INSERTION  06/10/2021   JOINT REPLACEMENT Right    knee   KNEE ARTHROPLASTY Right 05/08/2018   Procedure: COMPUTER ASSISTED TOTAL  KNEE ARTHROPLASTY;  Surgeon: Dereck Leep, MD;  Location: [facility] ORS;  Service: Orthopedics;  Laterality: Right;   KNEE ARTHROPLASTY Left 11/16/2019   Procedure: COMPUTER ASSISTED TOTAL KNEE ARTHROPLASTY;  Surgeon: Dereck Leep, MD;  Location: [facility] ORS;  Service: Orthopedics;  Laterality: Left;   LAPAROTOMY  04-20-13   colon resection with colosotmy   LITHOTRIPSY  2013   TONSILLECTOMY AND ADENOIDECTOMY  1962    SOCIAL HISTORY: Social History   Socioeconomic History   Marital status: Married    Spouse name: Not on file   Number of children: Not on file   Years of education: Not on file   Highest education level: Not on file  Occupational History   Not on file  Tobacco Use   Smoking status: Every Day    Packs/day: 1.00    Years: 30.00    Pack years: 30.00    Types: Cigarettes   Smokeless tobacco: Never   Tobacco comments:    since age 29 or 62  Vaping Use   Vaping Use: Never used  Substance and Sexual Activity   Alcohol use: Yes    Alcohol/week: 2.0 standard drinks    Types: 2 Standard drinks or equivalent per week    Comment: 1-2 drinks per week   Drug use: Yes    Types: Marijuana   Sexual activity: Not on file  Other Topics Concern   Not on file  Social History Narrative   15 mins [employer]; smoking; not much alcohol. 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  MEDICATIONS:  Current Facility-Administered Medications  Medication Dose Route Frequency Provider Last Rate Last Admin   acetaminophen (TYLENOL) tablet 650 mg  650 mg Oral Q6H PRN Howerter, Justin B, DO       Or   acetaminophen (TYLENOL) suppository 650 mg  650 mg Rectal Q6H PRN Howerter,  Justin B, DO       albuterol (PROVENTIL) (2.5 MG/3ML) 0.083% nebulizer solution 2.5 mg  2.5 mg Nebulization Q4H PRN Howerter, Justin B, DO       azithromycin (ZITHROMAX) 500 mg in sodium chloride 0.9 % 250 mL IVPB  500 mg Intravenous Q24H Howerter, Justin B, DO 250 mL/hr at 12/01/21 1957 500 mg at 12/01/21 1957   ceFEPIme (MAXIPIME) 2 g in sodium chloride 0.9 % 100 mL IVPB  2 g Intravenous Q8H Nazari, Walid A, RPH 200 mL/hr at 12/01/21 1405 2 g at 12/01/21 1405   chlorpheniramine-HYDROcodone 10-8 MG/5ML suspension 5 mL  5 mL Oral Q12H PRN Nicole Kindred A, DO   5 mL at 12/01/21 1805   dextromethorphan-guaiFENesin (MUCINEX DM) 30-600 MG per 12 hr tablet 1 tablet  1 tablet Oral BID Nicole Kindred A, DO   1 tablet at 12/01/21 2137   dorzolamide-timolol (COSOPT) 22.3-6.8 MG/ML ophthalmic solution 1 drop  1 drop Both Eyes BID Howerter, Justin B, DO   1  drop at 12/01/21 2135   dronabinol (MARINOL) capsule 5 mg  5 mg Oral BID AC Nicole Kindred A, DO   5 mg at 12/01/21 1727   feeding supplement (ENSURE ENLIVE / ENSURE PLUS) liquid 237 mL  237 mL Oral TID BM Nicole Kindred A, DO       insulin aspart (novoLOG) injection 0-9 Units  0-9 Units Subcutaneous TID WC Howerter, Justin B, DO   2 Units at 12/01/21 1727   ipratropium-albuterol (DUONEB) 0.5-2.5 (3) MG/3ML nebulizer solution 3 mL  3 mL Nebulization Q4H PRN Nicole Kindred A, DO   3 mL at 11/30/21 1613   ketorolac (TORADOL) 30 MG/ML injection 15 mg  15 mg Intravenous Q6H PRN Nicole Kindred A, DO   15 mg at 11/30/21 2007   latanoprost (XALATAN) 0.005 % ophthalmic solution 1 drop  1 drop Both Eyes QHS Howerter, Justin B, DO   1 drop at 12/01/21 2137   levalbuterol (XOPENEX) nebulizer solution 1.25 mg  1.25 mg Nebulization Q6H Nicole Kindred A, DO   1.25 mg at 12/01/21 2000   lidocaine (LIDODERM) 5 % 1 patch  1 patch Transdermal Q24H Nicole Kindred A, DO   1 patch at 11/30/21 1403   loratadine (CLARITIN) tablet 10 mg  10 mg Oral Daily Howerter, Justin B,  DO   10 mg at 12/01/21 0935   mometasone-formoterol (DULERA) 200-5 MCG/ACT inhaler 2 puff  2 puff Inhalation BID Nicole Kindred A, DO   2 puff at 12/01/21 2137   montelukast (SINGULAIR) tablet 10 mg  10 mg Oral QHS Howerter, Justin B, DO   10 mg at 12/01/21 2134   [START ON 12/02/2021] multivitamin with minerals tablet 1 tablet  1 tablet Oral Daily Ezekiel Slocumb, DO       ondansetron [facility]) injection 4 mg  4 mg Intravenous Q6H PRN Howerter, Justin B, DO   4 mg at 12/01/21 1402   pregabalin (LYRICA) capsule 50 mg  50 mg Oral BID Howerter, Justin B, DO   50 mg at 12/01/21 2134   tamsulosin (FLOMAX) capsule 0.4 mg  0.4 mg Oral Daily Nicole Kindred A, DO   0.4 mg at 12/01/21 0931   traMADol (ULTRAM) tablet 100 mg  100 mg Oral Q6H PRN Howerter, Justin B, DO       venlafaxine XR (EFFEXOR-XR) 24 hr capsule 225 mg  225 mg Oral Daily Howerter, Justin B, DO   225 mg at 12/01/21 8101   Facility-Administered Medications Ordered in Other Encounters  Medication Dose Route Frequency Provider Last Rate Last Admin   heparin lock flush 100 UNIT/ML injection            heparin lock flush 100 UNIT/ML injection            heparin lock flush 100 UNIT/ML injection               .  PHYSICAL EXAMINATION:  Vitals:   12/01/21 1952 12/01/21 2000  BP: 124/70   Pulse: (!) 106   Resp: 18   Temp: 98.7 F (37.1 C)   SpO2: 97% 100%   Filed Weights   11/29/21 2043 12/01/21 0100  Weight: 184 lb (83.5 kg) 185 lb 10 oz (84.2 kg)    Physical Exam Vitals and nursing note reviewed.  HENT:     Head: Normocephalic and atraumatic.     Mouth/Throat:     Pharynx: Oropharynx is clear.  Eyes:     Extraocular Movements: Extraocular movements intact.  Pupils: Pupils are equal, round, and reactive to light.  Cardiovascular:     Rate and Rhythm: Normal rate and regular rhythm.  Pulmonary:     Comments: Decreased breath sounds bilaterally.  Abdominal:     Palpations: Abdomen is soft.  Musculoskeletal:         General: Normal range of motion.     Cervical back: Normal range of motion.  Skin:    General: Skin is warm.  Neurological:     General: No focal deficit present.     Mental Status: He is alert and oriented to person, place, and time.  Psychiatric:        Behavior: Behavior normal.        Judgment: Judgment normal.     LABORATORY DATA:  I have reviewed the data as listed Lab Results  Component Value Date   WBC 11.9 (H) 12/01/2021   HGB 7.3 (L) 12/01/2021   HCT 24.1 (L) 12/01/2021   MCV 89.3 12/01/2021   PLT 211 12/01/2021   Recent Labs    11/26/21 0922 11/29/21 2113 11/30/21 0511 12/01/21 0504  NA 131* 135 136 141  K 3.4* 3.1* 3.6 3.7  CL 99 99 102 107  CO2 27 26 23 26   GLUCOSE 275* 228* 165* 129*  BUN 11 7* 7* 12  CREATININE 0.82 0.71 0.76 0.70  CALCIUM 8.5* 8.5* 8.1* 8.6*  GFRNONAA >60 >60 >60 >60  PROT 7.2 6.4* 6.1*  --   ALBUMIN 2.7* 2.3* 2.2*  --   AST 20 15 15   --   ALT 15 15 13   --   ALKPHOS 105 99 88  --   BILITOT 0.4 0.3 0.2*  --     RADIOGRAPHIC STUDIES: I have personally reviewed the radiological images as listed and agreed with the findings in the report. DG Chest 1 View  Result Date: 11/29/2021 CLINICAL DATA:  Shortness of breath. Metastatic non-small cell lung cancer. EXAM: CHEST  1 VIEW COMPARISON:  Chest radiograph dated 09/18/2021 and CT dated 09/09/2021 FINDINGS: Left-sided Port-A-Cath in similar position. Large area of consolidation in the right upper lobe/right apex with central necrosis corresponding to the known malignancy. Overall increase in the opacity with decreased aeration of the right upper lobe compared to prior radiograph. Additionally there is diffuse interstitial nodularity primarily involving the right lung which is new compared to prior radiograph suspicious for metastatic disease or lymphangitic carcinomatosis. Atypical pneumonia is less likely but not excluded. No large pleural effusion. No pneumothorax. Stable cardiomediastinal  silhouette. No acute osseous pathology. IMPRESSION: Progression of right upper lobe centrally necrotic mass with findings suggestive of pulmonary metastatic disease or lymphangitic carcinomatosis, new since the prior radiograph. Electronically Signed   By: Anner Crete M.D.   On: 11/29/2021 21:38   CT CHEST W CONTRAST  Result Date: 11/30/2021 CLINICAL DATA:  Small cell lung cancer EXAM: CT CHEST WITH CONTRAST TECHNIQUE: Multidetector CT imaging of the chest was performed during intravenous contrast administration. RADIATION DOSE REDUCTION: This exam was performed according to the departmental dose-optimization program which includes automated exposure control, adjustment of the mA and/or kV according to patient size and/or use of iterative reconstruction technique. CONTRAST:  29mL OMNIPAQUE IOHEXOL 300 MG/ML  SOLN COMPARISON:  Chest CT with contrast dated September 09, 2021 FINDINGS: Cardiovascular: Normal heart size. No pericardial effusion. Coronary artery calcifications of the LAD and circumflex. Mediastinum/Nodes: Patulous esophagus. Thyroid is unremarkable. Enlarged mediastinal lymph nodes, unchanged compared to prior exam. Unchanged subcentimeter bilateral hilar lymph nodes. Lungs/Pleura:  Centrilobular emphysema. 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 Increased right upper lobe collapse. Increased centrilobular nodules of the bilateral lower lungs with associated interlobular septal thickening and more focal areas of consolidation. Increased consolidation and ground-glass opacities of the superior segment of the right lower lobe and paramediastinal left upper lobe, likely post radiation change. Upper Abdomen: Unchanged bilateral adrenal gland nodules, likely benign adenomas given stability on multiple prior exams.  Musculoskeletal: Destructive changes of the right lateral ribs adjacent to the right upper lobe mass, unchanged compared to prior exam. No new destructive osseous lesions. IMPRESSION: 1. 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 Mass is overall similar in size but demonstrates decreased fluid component and increased air component. 2. Increased centrilobular nodules and areas of consolidation which are most prominent in the lower lungs and likely due to aspiration. 3. Unchanged enlarged mediastinal lymph nodes. 4. Increased consolidation and ground-glass opacities of the superior segment of the right lower lobe and paramediastinal left upper lobe, likely evolving postradiation change. 5. Coronary artery calcifications, aortic Atherosclerosis (ICD10-I70.0) and Emphysema (ICD10-J43.9). Electronically Signed   By: Yetta Glassman M.D.   On: 11/30/2021 32:52     #66 year old male patient with a history of stage III lung cancer currently status post chemoradiation is currently admitted to hospital for acute respiratory failure  #Acute respiratory failure-multifactorial underlying malignancy/COPD exacerbation/pneumonia question aspiration-CT scan chest-radiation changes/bronchopleural fistula; possibly aspiration.  Currently on antibiotics.  #Lung cancer-stage III status post chemoradiation-CT scan chest shows no obvious progression of disease-however progression of disease cannot be refuted the context of treatment changes/infection  #Severe anemia-likely secondary to anemia of chronic disease/iron deficiency.    #Cecal mass-positive for colon cancer biopsy-currently managed conservatively given above acute issues.  Recommendations and plan:  #I reviewed the complex changes noted on the CT scan with the patient in detail.  We will also review the tumor conference on 2/28.  For now I agree with current treatment plan.  Would also recommend pulmonary evaluation.   #Await  repeat CBC tomorrow-if continues to be less than 8 tomorrow recommend 1 unit of PRBC transfusion.  Thank you Dr. [name] for allowing me to participate in the care of your pleasant patient. Please do not hesitate to contact me with questions or concerns in the interim.   All questions were answered. The patient knows to call the clinic with any problems, questions or concerns.     [name], MD 12/01/2021 9:51 PM

## 2021-12-01 NOTE — Progress Notes (Signed)
CCMD called to say patient's O2 sat was in the 80's; [name]/O X 4; High fowler's in bed; O2 not on; encouraged patient to deep breathe and keep O2 in nose; O2 at 2Lpm/[place]. 4 P's assessed. Denies distress at this time. Barbaraann Faster, RN 6:12 AM 12/01/2021

## 2021-12-01 NOTE — Assessment & Plan Note (Addendum)
Related to his lung cancer, intermittent nausea related to immunotherapy and overall poor appetite.  Recently started on Megace, notes not noticing improvement.  Started on Marinol. Dietitian following. Ensures and vitamins per dietitian.

## 2021-12-01 NOTE — Assessment & Plan Note (Addendum)
related to chronic illness (lung cancer) as evidenced by mild fat depletion, moderate fat depletion, mild muscle depletion, moderate muscle depletion -- Diet nutrition following, appreciate recommendations -- Patient on appetite stimulant Marinol -- Continue ensures and multivitamin

## 2021-12-01 NOTE — Progress Notes (Signed)
°  Chaplain On-Call responded to a call from RN Hiram Comber, Director of Operative Services.  While rounding on patients, Beverlee Nims learned of this patient's request for completion of Advance Directive documents.  Chaplain enlisted two [employer] [place] [employer] [place], and [employer], to verify the patient's signature and authorize the documents.  Chaplain placed a copy in patient's chart and returned the original documents to the patient.  Chaplain Pollyann Samples M.Div., [facility]

## 2021-12-01 NOTE — Progress Notes (Signed)
Progress Note   Patient: Rodney Vega ATF:[id number] DOB: [birth year] DOA: 11/29/2021     2 DOS: the patient was seen and examined on 12/01/2021   Brief hospital course: Rodney Vega is a 66 y.o. 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  Reported intermittent nausea, no vomiting, attributed to chemo/immunotherapy.  Admitted for IV antibiotics after chest x-ray shows a large area of consolidation in the right upper lobe with central necrosis corresponding to known malignancy while showing increasing airspace opacity of the right upper lobe as well as diffuse interstitial nodularity involving the right lung, which is new, concerning for metastatic disease versus lymphangitis carcinomatosis versus pneumonia.  Patient met sepsis criteria on admission with leukocytosis, tachycardia, tachypnea in the setting of likely pneumonia.  Assessment and Plan: * Severe sepsis ([name])- (present on admission) Present on admission with leukocytosis, tachycardia, tachypnea in setting of PNA.  Organ dysfunction and severe sepsis evidenced by acute hypoxic respiratory failure.  Continue IV antibiotics for PNA. Currently off IV fluids.  Follow cultures.  Acute respiratory failure with hypoxia ([name])- (present on admission) Due to PNA and lung cancer underlying. Presented with dyspnea, cough, chest xray with RUL airspace disease surrounding the known mass, with O2 sat in upper 80's on room air. --Supplement o2, keep sats above 90% --Treat PNA as outlined --Wean O2 as tolerated --Incentive spirometer  SOB (shortness of breath)- (present on admission) Due to PNA.  Mgmt as outlined. Monitor closely.  CAP (community acquired pneumonia)- (present on admission) POA, with  underlying RUL lung cancer.  --Continue Cefepime and Zithromax --Supportive care with mucolytics, o2 support PRN, antipyretics and analgesia as needed --Sputum culture   Hypokalemia- (present on admission) POA, resolved with replacement. Monitor and replace K as needed. K today 3.7  Primary cancer of right upper lobe of lung ([name])- (present on admission) Follows with Dr. Rogue Vega, consulted. On immunotherapy.  Had chemo and XRT in the past.  Follow up oncology recommendations.  CT chest with contrast was planned for 3/1, ordered.   Diabetes mellitus without complication (HCC) Sliding scale Novolog.  Hold glipizide.  Hypertension- (present on admission) Chronic. BP's stable, mildly soft at times. Hold home losartan for now in setting of sepsis. 2/28: BP stable  Depression- (present on admission) Continue Effexor  Colostomy in place [name] No acute issues.  Monitor.  Inadequate oral intake Related to his lung cancer, intermittent nausea related to immunotherapy and overall poor appetite.  Recently started on Megace, notes not noticing improvement.  Agreeable to try Marinol, ordered. Dietitian following. Resume maintenance IV fluids if needed. Encourage p.o. intake. Ensures and vitamins per dietitian.  Malnutrition of moderate degree related to chronic illness (lung cancer) as evidenced by mild fat depletion, moderate fat depletion, mild muscle depletion, moderate muscle depletion -- Diet nutrition following, appreciate recommendations -- Patient on appetite stimulant -- Continue ensures and multivitamin -- Monitor K, Mg, Phos given risk for refeeding syndrome        Subjective: Patient seen with wife at bedside today.  He reports feeling about the same, may be slightly better.  Still reporting intermittent nausea and overall poor appetite.  He is agreeable to try Marinol to also help with nausea, sleep and possibly mood.  No fevers or chills.  No other acute complaints  at this  time.  He does states restless and a bit depressed being stuck in the bed and not able to be up walking around.  I asked charge nurse if possible to move to a larger room.  Physical Exam: Vitals:   12/01/21 0740 12/01/21 0808 12/01/21 1213 12/01/21 1439  BP:  134/70    Pulse:  (!) 110    Resp:  20    Temp:  98.8 F (37.1 C)    TempSrc:  Oral    SpO2: 92% 93% 96% 93%  Weight:      Height:       General exam: awake, alert, no acute distress, ill-appearing HEENT: moist mucus membranes, hearing grossly normal  Respiratory system: Right-sided rhonchi otherwise clear, no wheezes, normal respiratory effort at rest, on 2 L/min [place] O2. Cardiovascular system: normal S1/S2, RRR, no pedal edema.   Central nervous system: A&O x3. no gross focal neurologic deficits, normal speech Extremities: moves all, no edema, normal tone Skin: dry, intact, normal temperature Psychiatry: normal mood, congruent affect, judgement and insight appear normal   Data Reviewed:  Labs reviewed and notable for glucose 129, calcium 8.6, white count 11.9, hemoglobin stable 7.3  Family Communication: Wife at bedside on rounds today  Disposition: Status is: Inpatient Remains inpatient appropriate because: Severity of illness with ongoing hypoxia requiring supplemental oxygen and on IV antibiotics.      Planned Discharge Destination: Home     Time spent: 35 minutes  Author: Ezekiel Slocumb, DO 12/01/2021 4:05 PM  For on call review [email or web address].

## 2021-12-01 NOTE — Plan of Care (Signed)
  Problem: Health Behavior/Discharge Planning: Goal: Ability to manage health-related needs will improve Outcome: Progressing   

## 2021-12-01 NOTE — Progress Notes (Signed)
Initial Nutrition Assessment  DOCUMENTATION CODES:   Non-severe (moderate) malnutrition in context of chronic illness  INTERVENTION:   Ensure Enlive po TID, each supplement provides 350 kcal and 20 grams of protein.  MVI po daily   Pt at high refeed risk; recommend monitor potassium, magnesium and phosphorus labs daily until stable  NUTRITION DIAGNOSIS:   Moderate Malnutrition related to chronic illness (lung cancer) as evidenced by mild fat depletion, moderate fat depletion, mild muscle depletion, moderate muscle depletion.  GOAL:   Patient will meet greater than or equal to 90% of their needs  MONITOR:   PO intake, Supplement acceptance, Labs, Weight trends, Skin, I & O's  REASON FOR ASSESSMENT:   Malnutrition Screening Tool    ASSESSMENT:   66 y/o male with h/o stage IIIa non-small cell lung cancer status post chemotherapy and radiation, DM, HTN, CAD, depression, diverticulitis s/p resection/colostomy 2014, hernia repair (1979) and cecal mass who is admitted with CAP and sepsis.  Met with pt in room today. Pt reports that he is not feeling well today. Pt reports intermittent poor appetite and oral intake pta r/t chemotherapy and radiation; pt reports that his oral intake had started to improve until the past couple of days when he got sick. Pt ate 90% of his lunch tray today and drank a whole Ensure Max Protein. Pt reports that he has been drinking chocolate and vanilla Ensure at home and is willing to drink this in the hospital. Of note, pt wears full dentures. Pt is followed by the RD at the [facility]. Per chart, pt is down ~28lbs(13%) over the past year. RD discussed with pt the importance of adequate nutrition needed to preserve lean muscle. RD will add supplements and MVI to help pt meet his estimated needs. Pt is at high refeed risk.   Medications reviewed and include: insulin, azithromycin, cefepime   Labs reviewed: K 3.7 wnl Wbc- 11.9(H), Hgb 7.3(L), Hct  24.1(L) Cbgs- 156, 143 x 24 hrs  AIC 6.1(H)- 2019  NUTRITION - FOCUSED PHYSICAL EXAM:  Flowsheet Row Most Recent Value  Orbital Region Mild depletion  Upper Arm Region Moderate depletion  Thoracic and Lumbar Region Moderate depletion  Buccal Region Mild depletion  Temple Region Mild depletion  Clavicle Bone Region Mild depletion  Clavicle and Acromion Bone Region Mild depletion  Scapular Bone Region Mild depletion  Dorsal Hand No depletion  Patellar Region Moderate depletion  Anterior Thigh Region Moderate depletion  Posterior Calf Region Moderate depletion  Edema (RD Assessment) None  Hair Reviewed  Eyes Reviewed  Mouth Reviewed  Skin Reviewed  Nails Reviewed   Diet Order:   Diet Order             Diet regular Room service appropriate? Yes; Fluid consistency: Thin  Diet effective now                  EDUCATION NEEDS:   Education needs have been addressed  Skin:  Skin Assessment: Reviewed RN Assessment  Last BM:  2/27  Height:   Ht Readings from Last 1 Encounters:  12/01/21 $RemoveB'5\' 11"'MbsoVjzS$  (1.803 m)    Weight:   Wt Readings from Last 1 Encounters:  12/01/21 84.2 kg    Ideal Body Weight:  78.18 kg  BMI:  Body mass index is 25.89 kg/m.  Estimated Nutritional Needs:   Kcal:  2200-2500kcal/day  Protein:  110-125g/day  Fluid:  2.1-2.4L/day  Koleen Distance MS, RD, LDN Please refer to [facility] for RD and/or RD on-call/weekend/after  hours pager

## 2021-12-02 ENCOUNTER — Ambulatory Visit
Admission: RE | Admit: 2021-12-02 | Discharge: 2021-12-02 | Disposition: A | Discharge status: Home / Self Care | Payer: Medicare PPO | Source: Ambulatory Visit | Attending: Internal Medicine | Admitting: Internal Medicine

## 2021-12-02 DIAGNOSIS — K6389 Other specified diseases of intestine: Secondary | ICD-10-CM | POA: Diagnosis present

## 2021-12-02 DIAGNOSIS — A419 Sepsis, unspecified organism: Secondary | ICD-10-CM | POA: Diagnosis not present

## 2021-12-02 DIAGNOSIS — R6520 Severe sepsis without septic shock: Secondary | ICD-10-CM | POA: Diagnosis not present

## 2021-12-02 DIAGNOSIS — D649 Anemia, unspecified: Secondary | ICD-10-CM | POA: Diagnosis present

## 2021-12-02 LAB — HELPER T-LYMPH-CD4 (ARMC ONLY)
% CD 4 Pos. Lymph.: 33 % (ref 30.8–58.5)
Absolute CD 4 Helper: 264 /uL — ABNORMAL LOW (ref 359–1519)
Basophils Absolute: 0 10*3/uL (ref 0.0–0.2)
Basos: 0 %
EOS (ABSOLUTE): 0.3 10*3/uL (ref 0.0–0.4)
Eos: 3 %
Hematocrit: 23.7 % — ABNORMAL LOW (ref 37.5–51.0)
Hemoglobin: 7.5 g/dL — ABNORMAL LOW (ref 13.0–17.7)
Immature Grans (Abs): 0 10*3/uL (ref 0.0–0.1)
Immature Granulocytes: 0 %
Lymphocytes Absolute: 0.8 10*3/uL (ref 0.7–3.1)
Lymphs: 7 %
MCH: 28.2 pg (ref 26.6–33.0)
MCHC: 31.6 g/dL (ref 31.5–35.7)
MCV: 89 fL (ref 79–97)
Monocytes Absolute: 0.5 10*3/uL (ref 0.1–0.9)
Monocytes: 5 %
Neutrophils Absolute: 9.6 10*3/uL — ABNORMAL HIGH (ref 1.4–7.0)
Neutrophils: 85 %
Platelets: 243 10*3/uL (ref 150–450)
RBC: 2.66 x10E6/uL — ABNORMAL LOW (ref 4.14–5.80)
RDW: 16.8 % — ABNORMAL HIGH (ref 11.6–15.4)
WBC: 11.2 10*3/uL — ABNORMAL HIGH (ref 3.4–10.8)

## 2021-12-02 LAB — CBC
HCT: 25.4 % — ABNORMAL LOW (ref 39.0–52.0)
Hemoglobin: 7.6 g/dL — ABNORMAL LOW (ref 13.0–17.0)
MCH: 26.6 pg (ref 26.0–34.0)
MCHC: 29.9 g/dL — ABNORMAL LOW (ref 30.0–36.0)
MCV: 88.8 fL (ref 80.0–100.0)
Platelets: 217 10*3/uL (ref 150–400)
RBC: 2.86 MIL/uL — ABNORMAL LOW (ref 4.22–5.81)
RDW: 17.2 % — ABNORMAL HIGH (ref 11.5–15.5)
WBC: 10.6 10*3/uL — ABNORMAL HIGH (ref 4.0–10.5)
nRBC: 0 % (ref 0.0–0.2)

## 2021-12-02 LAB — GLUCOSE, CAPILLARY
Glucose-Capillary: 132 mg/dL — ABNORMAL HIGH (ref 70–99)
Glucose-Capillary: 132 mg/dL — ABNORMAL HIGH (ref 70–99)
Glucose-Capillary: 136 mg/dL — ABNORMAL HIGH (ref 70–99)

## 2021-12-02 LAB — BASIC METABOLIC PANEL
Anion gap: 9 (ref 5–15)
BUN: 11 mg/dL (ref 8–23)
CO2: 27 mmol/L (ref 22–32)
Calcium: 8.6 mg/dL — ABNORMAL LOW (ref 8.9–10.3)
Chloride: 104 mmol/L (ref 98–111)
Creatinine, Ser: 0.68 mg/dL (ref 0.61–1.24)
GFR, Estimated: >60 mL/min (ref >60)
Glucose, Bld: 139 mg/dL — ABNORMAL HIGH (ref 70–99)
Potassium: 3.5 mmol/L (ref 3.5–5.1)
Sodium: 140 mmol/L (ref 135–145)

## 2021-12-02 LAB — MRSA NEXT GEN BY PCR, NASAL: MRSA by PCR Next Gen: NOT DETECTED

## 2021-12-02 LAB — HIV-1 RNA QUANT-NO REFLEX-BLD
HIV 1 RNA Quant: <20 copies/mL
LOG10 HIV-1 RNA: UNDETERMINED log10copy/mL

## 2021-12-02 LAB — MAGNESIUM: Magnesium: 2 mg/dL (ref 1.7–2.4)

## 2021-12-02 LAB — C-REACTIVE PROTEIN: CRP: 20.1 mg/dL — ABNORMAL HIGH (ref <1.0)

## 2021-12-02 LAB — PHOSPHORUS: Phosphorus: 3.7 mg/dL (ref 2.5–4.6)

## 2021-12-02 LAB — PREPARE RBC (CROSSMATCH)

## 2021-12-02 LAB — SEDIMENTATION RATE: Sed Rate: 128 mm/hr — ABNORMAL HIGH (ref 0–20)

## 2021-12-02 MED ORDER — ACETAMINOPHEN 325 MG PO TABS
650.0000 mg | ORAL_TABLET | Freq: Once | ORAL | Status: AC
  Administered 2021-12-02: 650 mg via ORAL
  Filled 2021-12-02: qty 2

## 2021-12-02 MED ORDER — PREDNISONE 20 MG PO TABS
40.0000 mg | ORAL_TABLET | Freq: Every day | ORAL | Status: DC
  Administered 2021-12-02: 40 mg via ORAL
  Filled 2021-12-02: qty 2

## 2021-12-02 MED ORDER — SODIUM CHLORIDE 0.9% IV SOLUTION: Freq: Once | INTRAVENOUS | Status: AC

## 2021-12-02 MED ORDER — DIPHENHYDRAMINE HCL 25 MG PO CAPS
25.0000 mg | ORAL_CAPSULE | Freq: Once | ORAL | Status: AC
  Administered 2021-12-02: 25 mg via ORAL
  Filled 2021-12-02: qty 1

## 2021-12-02 NOTE — Evaluation (Signed)
Occupational Therapy Evaluation ?Patient Details ?Name: Rodney Vega ?MRN: [id number] ?DOB: 02-02-1956 ?Today's Date: 12/02/2021 ? ? ?History of Present Illness 66 y.o.  male with longstanding history of smoking; non-small cell lung cancer right upper lobe stage III status post chemoradiation; diabetes; right-sided colon cancer managed conservatively is currently admitted to hospital for progressive shortness of breath  ? ?Clinical Impression ?  ?Upon entering the room, pt seated in recliner chair and agreeable to OT intervention. Pt reports being independent at home and living with his wife. Pt on RA and remaining at or above 93% throughout session for O2 saturation. Pt standing without assistance and ambulating in room to sink and then to bathroom for toileting needs at mod I level. Pt does fatigue quickly and educated on energy conservation strategies for home. Pt verbalized understanding. Pt returning back to recliner chair at end of session. Pt agrees no further OT intervention needed at this time. OT to SIGN OFF.  ?   ? ?Recommendations for follow up therapy are one component of a multi-disciplinary discharge planning process, led by the attending physician.  Recommendations may be updated based on patient status, additional functional criteria and insurance authorization.  ? ?Follow Up Recommendations ? No OT follow up  ?  ?Assistance Recommended at Discharge Intermittent Supervision/Assistance  ?   ?Functional Status Assessment ? Patient has not had a recent decline in their functional status  ?Equipment Recommendations ? None recommended by OT  ?  ?   ?Precautions / Restrictions Precautions ?Precautions: Fall ?Restrictions ?Weight Bearing Restrictions: No  ? ?  ? ?Mobility Bed Mobility ?  ?  ?  ?  ?  ?  ?  ?General bed mobility comments: Pt seated in recliner chair ?  ? ?Transfers ?Overall transfer level: Modified independent ?Equipment used: None ?  ?  ?  ?  ?  ?  ?  ?General transfer comment: Pt able to rise  to standing without hesitation and showed good confidence and safety ?  ? ?  ?Balance Overall balance assessment: Modified Independent ?  ?  ?  ?  ?  ?  ?  ?  ?  ?  ?  ?  ?  ?  ?  ?  ?  ?  ?   ? ?ADL either performed or assessed with clinical judgement  ? ?ADL Overall ADL's : Modified independent;At baseline ?  ?  ?  ?  ?  ?  ?  ?  ?  ?  ?  ?  ?  ?  ?  ?  ?  ?  ?  ?General ADL Comments: did educate on energy conservation strategies for self care  ? ? ? ?Vision Patient Visual Report: No change from baseline ?   ?   ?   ?   ? ?Pertinent Vitals/Pain Pain Assessment ?Pain Assessment: No/denies pain  ? ? ? ?Hand Dominance Right ?  ?Extremity/Trunk Assessment Upper Extremity Assessment ?Upper Extremity Assessment: Overall WFL for tasks assessed ?  ?Lower Extremity Assessment ?Lower Extremity Assessment: Overall WFL for tasks assessed ?  ?  ?  ?Communication Communication ?Communication: No difficulties ?  ?Cognition Arousal/Alertness: Awake/alert ?Behavior During Therapy: Restless ?Overall Cognitive Status: Within Functional Limits for tasks assessed ?  ?  ?  ?  ?  ?  ?  ?  ?  ?  ?  ?  ?  ?  ?  ?  ?  ?  ?  ?   ?   ?   ? ? ?  Home Living Family/patient expects to be discharged to:: Private residence ?Living Arrangements: Spouse/significant other ?Available Help at Discharge: Family;Available 24 hours/day ?Type of Home: House ?Home Access: Stairs to enter ?Entrance Stairs-Number of Steps: 3 ?Entrance Stairs-Rails: Can reach both ?Home Layout: One level ?  ?  ?Bathroom Shower/Tub: Walk-in shower ?  ?Bathroom Toilet: Standard ?  ?  ?Home Equipment: Conservation officer, nature (2 wheels);Cane - single point ?  ?  ?  ? ?  ?Prior Functioning/Environment Prior Level of Function : Independent/Modified Independent ?  ?  ?  ?  ?  ?  ?Mobility Comments: able to be active in the community without AD ?ADLs Comments: He shares IADL tasks with wife and really loves to work outside in yard when able. Ind in self care tasks and driving. ?  ? ?  ?  ?   ?    ?   ?OT Goals(Current goals can be found in the care plan section) Acute Rehab OT Goals ?Patient Stated Goal: to go home ?OT Goal Formulation: With patient ?Time For Goal Achievement: 12/02/21 ?Potential to Achieve Goals: Good  ?OT Frequency:   ?  ? ?   ?AM-PAC OT "6 Clicks" Daily Activity     ?Outcome Measure Help from another person eating meals?: None ?Help from another person taking care of personal grooming?: None ?Help from another person toileting, which includes using toliet, bedpan, or urinal?: None ?Help from another person bathing (including washing, rinsing, drying)?: None ?Help from another person to put on and taking off regular upper body clothing?: None ?Help from another person to put on and taking off regular lower body clothing?: None ?6 Click Score: 24 ?  ?End of Session Nurse Communication: Mobility status ? ?Activity Tolerance: Patient tolerated treatment well ?Patient left: in bed;with call bell/phone within reach ? ?   ?              ?Time: 9381-0175 ?OT Time Calculation (min): 16 min ?Charges:  OT General Charges ?$OT Visit: 1 Visit ?OT Evaluation ?$OT Eval Low Complexity: 1 Low ?OT Treatments ?$Self Care/Home Management : 8-22 mins ?Darleen Crocker, [place], OTR/L , CBIS ?ascom [phone]  ?12/02/21, 3:23 PM  ?

## 2021-12-02 NOTE — Care Management Important Message (Signed)
Important Message ? ?Patient Details  ?Name: Rodney Vega ?MRN: [id number] ?Date of Birth: [birth year] ? ? ?Medicare Important Message Given:  Yes ? ? ? ? ?Dannette Barbara ?12/02/2021, 11:42 AM ?

## 2021-12-02 NOTE — Assessment & Plan Note (Signed)
positive for colon cancer biopsy-currently managed conservatively given above acute issues. ?--oncology following ?

## 2021-12-02 NOTE — Consult Note (Signed)
PULMONOLOGY         Date: 12/02/2021,   MRN# [id number] [place] 03/10/1956     AdmissionWeight: 83.5 kg                 CurrentWeight: 84.2 kg   Referring physician: Dr [name]   CHIEF COMPLAINT:   Multifocal pneumonia worse on right with hx of lung cancer s/p treatment   HISTORY OF PRESENT ILLNESS   This is a 66 yo M with hx of NSCLC of RUL with cavitary component s/p chemoradiation therapy, COPD with lifelong smoking status, colon cancer with recent declince clinically including worsening dyspnea and productive cough with bilateral inspiratory and expiratory wheezing. He had chest CT done which I reviewed independently showing cavitary RUL process post cancer treatment and multifocal infiltrates. He was seen by oncologist while hospitalized and current chemotherapy is paused.  He is being treated for pneumonia and is receiving broad spectrum antibiotics with steroids. PCCM consultation for further evaluation and management.    PAST MEDICAL HISTORY   Past Medical History:  Diagnosis Date   Cancer [facility])    Degenerative arthritis of knee    Depression    Diabetes mellitus without complication ([place])    Diverticulitis    Glaucoma    since 2004   Hernia 1979   History of kidney stones    Hypertension    Personal history of tobacco use, presenting hazards to health    Pre-diabetes    Screening for obesity    Special screening for malignant neoplasms, colon    Wears dentures    full upper and lower     SURGICAL HISTORY   Past Surgical History:  Procedure Laterality Date   [place], 2012   lumbar bulging disc   CATARACT EXTRACTION W/PHACO Left 01/20/2021   Procedure: CATARACT EXTRACTION PHACO AND INTRAOCULAR LENS PLACEMENT ([place]) LEFT;  Surgeon: Birder Robson, MD;  Location: [facility];  Service: Ophthalmology;  Laterality: Left;  10.13 0:52.1   COLONOSCOPY  [id number]   [facility]/Dr. Buelah Manis sessile polyp,66mm, found  in rectum,multiple diverticula were found in the sigmoid, in descending and in transverse colon   COLONOSCOPY WITH PROPOFOL N/A 10/22/2021   Procedure: COLONOSCOPY WITH PROPOFOL;  Surgeon: Annamaria Helling, DO;  Location: [facility] ENDOSCOPY;  Service: Gastroenterology;  Laterality: N/A;  DM   COLOSTOMY  04-20-13   HERNIA REPAIR  1979   inguinal   IR IMAGING GUIDED PORT INSERTION  06/10/2021   JOINT REPLACEMENT Right    knee   KNEE ARTHROPLASTY Right 05/08/2018   Procedure: COMPUTER ASSISTED TOTAL KNEE ARTHROPLASTY;  Surgeon: Dereck Leep, MD;  Location: [facility] ORS;  Service: Orthopedics;  Laterality: Right;   KNEE ARTHROPLASTY Left 11/16/2019   Procedure: COMPUTER ASSISTED TOTAL KNEE ARTHROPLASTY;  Surgeon: Dereck Leep, MD;  Location: [facility] ORS;  Service: Orthopedics;  Laterality: Left;   LAPAROTOMY  04-20-13   colon resection with colosotmy   LITHOTRIPSY  2013   TONSILLECTOMY AND ADENOIDECTOMY  1962     FAMILY HISTORY   Family History  Problem Relation Age of Onset   Diabetes Mother    Heart disease Mother    Heart attack Father    Prostate cancer Father      SOCIAL HISTORY   Social History   Tobacco Use   Smoking status: Every Day    Packs/day: 1.00    Years: 30.00    Pack years: 30.00    Types: Cigarettes  Smokeless tobacco: Never   Tobacco comments:    since age 56 or 36  Vaping Use   Vaping Use: Never used  Substance Use Topics   Alcohol use: Yes    Alcohol/week: 2.0 standard drinks    Types: 2 Standard drinks or equivalent per week    Comment: 1-2 drinks per week   Drug use: Yes    Types: Marijuana     MEDICATIONS    Home Medication:    Current Medication:  Current Facility-Administered Medications:    acetaminophen (TYLENOL) tablet 650 mg, 650 mg, Oral, Q6H PRN **OR** acetaminophen (TYLENOL) suppository 650 mg, 650 mg, Rectal, Q6H PRN, Howerter, Justin B, DO   albuterol (PROVENTIL) (2.5 MG/3ML) 0.083% nebulizer solution 2.5  mg, 2.5 mg, Nebulization, Q4H PRN, Howerter, Justin B, DO   azithromycin (ZITHROMAX) 500 mg in sodium chloride 0.9 % 250 mL IVPB, 500 mg, Intravenous, Q24H, Howerter, Justin B, DO, Last Rate: 250 mL/hr at 12/01/21 1957, 500 mg at 12/01/21 1957   ceFEPIme (MAXIPIME) 2 g in sodium chloride 0.9 % 100 mL IVPB, 2 g, Intravenous, Q8H, Nazari, Walid A, RPH, Last Rate: 200 mL/hr at 12/02/21 0526, 2 g at 12/02/21 0526   chlorpheniramine-HYDROcodone 10-8 MG/5ML suspension 5 mL, 5 mL, Oral, Q12H PRN, Nicole Kindred A, DO, 5 mL at 12/01/21 1805   dextromethorphan-guaiFENesin ([place] DM) 30-600 MG per 12 hr tablet 1 tablet, 1 tablet, Oral, BID, Nicole Kindred A, DO, 1 tablet at 12/01/21 2137   dorzolamide-timolol (COSOPT) 22.3-6.8 MG/ML ophthalmic solution 1 drop, 1 drop, Both Eyes, BID, Howerter, Justin B, DO, 1 drop at 12/01/21 2135   dronabinol (MARINOL) capsule 5 mg, 5 mg, Oral, BID AC, Nicole Kindred A, DO, 5 mg at 12/01/21 1727   feeding supplement (ENSURE ENLIVE / ENSURE PLUS) liquid 237 mL, 237 mL, Oral, TID BM, [name], Kelly A, DO   insulin aspart (novoLOG) injection 0-9 Units, 0-9 Units, Subcutaneous, TID WC, Howerter, Justin B, DO, 2 Units at 12/01/21 1727   ipratropium-albuterol (DUONEB) 0.5-2.5 (3) MG/3ML nebulizer solution 3 mL, 3 mL, Nebulization, Q4H PRN, Nicole Kindred A, DO, 3 mL at 11/30/21 1613   ketorolac (TORADOL) 30 MG/ML injection 15 mg, 15 mg, Intravenous, Q6H PRN, Nicole Kindred A, DO, 15 mg at 11/30/21 2007   latanoprost (XALATAN) 0.005 % ophthalmic solution 1 drop, 1 drop, Both Eyes, QHS, Howerter, Justin B, DO, 1 drop at 12/01/21 2137   levalbuterol (XOPENEX) nebulizer solution 1.25 mg, 1.25 mg, Nebulization, Q6H, [name], Kelly A, DO, 1.25 mg at 12/02/21 0147   lidocaine (LIDODERM) 5 % 1 patch, 1 patch, Transdermal, Q24H, Nicole Kindred A, DO, 1 patch at 11/30/21 1403   loratadine (CLARITIN) tablet 10 mg, 10 mg, Oral, Daily, Howerter, Justin B, DO, 10 mg at  12/01/21 0935   mometasone-formoterol (DULERA) 200-5 MCG/ACT inhaler 2 puff, 2 puff, Inhalation, BID, Nicole Kindred A, DO, 2 puff at 12/01/21 2137   montelukast (SINGULAIR) tablet 10 mg, 10 mg, Oral, QHS, Howerter, Justin B, DO, 10 mg at 12/01/21 2134   multivitamin with minerals tablet 1 tablet, 1 tablet, Oral, Daily, Nicole Kindred A, DO   ondansetron (ZOFRAN) injection 4 mg, 4 mg, Intravenous, Q6H PRN, Howerter, Justin B, DO, 4 mg at 12/01/21 1402   pregabalin (LYRICA) capsule 50 mg, 50 mg, Oral, BID, Howerter, Justin B, DO, 50 mg at 12/01/21 2134   tamsulosin (FLOMAX) capsule 0.4 mg, 0.4 mg, Oral, Daily, Nicole Kindred A, DO, 0.4 mg at 12/01/21 0931   traMADol (ULTRAM) tablet  100 mg, 100 mg, Oral, Q6H PRN, Howerter, Justin B, DO   venlafaxine XR (EFFEXOR-XR) 24 hr capsule 225 mg, 225 mg, Oral, Daily, Howerter, Justin B, DO, 225 mg at 12/01/21 1324  Facility-Administered Medications Ordered in Other Encounters:    heparin lock flush 100 UNIT/ML injection, , , ,    heparin lock flush 100 UNIT/ML injection, , , ,    heparin lock flush 100 UNIT/ML injection, , , ,     ALLERGIES   Paclitaxel, Ambien [zolpidem], Nsaids, and Aleve [naproxen sodium]     REVIEW OF SYSTEMS    Review of Systems:  Gen:  Denies  fever, sweats, chills weigh loss  HEENT: Denies blurred vision, double vision, ear pain, eye pain, hearing loss, nose bleeds, sore throat Cardiac:  No dizziness, chest pain or heaviness, chest tightness,edema Resp:   reports phlegm on expectoration with brown debris and green debri Gi: Denies swallowing difficulty, stomach pain, nausea or vomiting, diarrhea, constipation, bowel incontinence Gu:  Denies bladder incontinence, burning urine Ext:   Denies Joint pain, stiffness or swelling Skin: Denies  skin rash, easy bruising or bleeding or hives Endoc:  Denies polyuria, polydipsia , polyphagia or weight change Psych:   Denies depression, insomnia or hallucinations    Other:  All other systems negative   VS: BP (!) 147/77 (BP Location: Left Arm)    Pulse (!) 106    Temp 98 F (36.7 C) (Oral)    Resp 17    Ht $R'5\' 11"'jw$  (1.803 m)    Wt 84.2 kg    SpO2 99%    BMI 25.89 kg/m      PHYSICAL EXAM    GENERAL:NAD, no fevers, chills, no weakness no fatigue HEAD: Normocephalic, atraumatic.  EYES: Pupils equal, round, reactive to light. Extraocular muscles intact. No scleral icterus.  MOUTH: Moist mucosal membrane. Dentition intact. No abscess noted.  EAR, NOSE, THROAT: Clear without exudates. No external lesions.  NECK: Supple. No thyromegaly. No nodules. No JVD.  PULMONARY: rhonchi worse on right CARDIOVASCULAR: S1 and S2. Regular rate and rhythm. No murmurs, rubs, or gallops. No edema. Pedal pulses 2+ bilaterally.  GASTROINTESTINAL: Soft, nontender, nondistended. No masses. Positive bowel sounds. No hepatosplenomegaly.  MUSCULOSKELETAL: No swelling, clubbing, or edema. Range of motion full in all extremities.  NEUROLOGIC: Cranial nerves II through XII are intact. No gross focal neurological deficits. Sensation intact. Reflexes intact.  SKIN: No ulceration, lesions, rashes, or cyanosis. Skin warm and dry. Turgor intact.  PSYCHIATRIC: Mood, affect within normal limits. The patient is awake, alert and oriented x 3. Insight, judgment intact.       IMAGING    DG Chest 1 View  Result Date: 11/29/2021 CLINICAL DATA:  Shortness of breath. Metastatic non-small cell lung cancer. EXAM: CHEST  1 VIEW COMPARISON:  Chest radiograph dated 09/18/2021 and CT dated 09/09/2021 FINDINGS: Left-sided Port-A-Cath in similar position. Large area of consolidation in the right upper lobe/right apex with central necrosis corresponding to the known malignancy. Overall increase in the opacity with decreased aeration of the right upper lobe compared to prior radiograph. Additionally there is diffuse interstitial nodularity primarily involving the right lung which is new compared to  prior radiograph suspicious for metastatic disease or lymphangitic carcinomatosis. Atypical pneumonia is less likely but not excluded. No large pleural effusion. No pneumothorax. Stable cardiomediastinal silhouette. No acute osseous pathology. IMPRESSION: Progression of right upper lobe centrally necrotic mass with findings suggestive of pulmonary metastatic disease or lymphangitic carcinomatosis, new since the prior radiograph. Electronically  Signed   By: Anner Crete M.D.   On: 11/29/2021 21:38   CT CHEST W CONTRAST  Result Date: 11/30/2021 CLINICAL DATA:  Small cell lung cancer EXAM: CT CHEST WITH CONTRAST TECHNIQUE: Multidetector CT imaging of the chest was performed during intravenous contrast administration. RADIATION DOSE REDUCTION: This exam was performed according to the departmental dose-optimization program which includes automated exposure control, adjustment of the mA and/or kV according to patient size and/or use of iterative reconstruction technique. CONTRAST:  69mL OMNIPAQUE IOHEXOL 300 MG/ML  SOLN COMPARISON:  Chest CT with contrast dated September 09, 2021 FINDINGS: Cardiovascular: Normal heart size. No pericardial effusion. Coronary artery calcifications of the LAD and circumflex. Mediastinum/Nodes: Patulous esophagus. Thyroid is unremarkable. Enlarged mediastinal lymph nodes, unchanged compared to prior exam. Unchanged subcentimeter bilateral hilar lymph nodes. Lungs/Pleura: Centrilobular emphysema. 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 Increased right upper lobe collapse. Increased centrilobular nodules of the bilateral lower lungs with associated interlobular septal thickening and more focal areas of consolidation. Increased consolidation and ground-glass opacities of the superior segment of the right lower  lobe and paramediastinal left upper lobe, likely post radiation change. Upper Abdomen: Unchanged bilateral adrenal gland nodules, likely benign adenomas given stability on multiple prior exams. Musculoskeletal: Destructive changes of the right lateral ribs adjacent to the right upper lobe mass, unchanged compared to prior exam. No new destructive osseous lesions. IMPRESSION: 1. 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 Mass is overall similar in size but demonstrates decreased fluid component and increased air component. 2. Increased centrilobular nodules and areas of consolidation which are most prominent in the lower lungs and likely due to aspiration. 3. Unchanged enlarged mediastinal lymph nodes. 4. Increased consolidation and ground-glass opacities of the superior segment of the right lower lobe and paramediastinal left upper lobe, likely evolving postradiation change. 5. Coronary artery calcifications, aortic Atherosclerosis (ICD10-I70.0) and Emphysema (ICD10-J43.9). Electronically Signed   By: Yetta Glassman M.D.   On: 11/30/2021 14:22      ASSESSMENT/PLAN   Acute hypoxemic respiratory failure - present on admission  - COVID19 negative   - supplemental O2 during my evaluation room air -procalcitonin is trending -serum fungitell -legionella ab -strep pneumoniae ur AG -Histoplasma Ur Ag -sputum resp cultures -AFB sputum expectorated specimen -reviewed pertinent imaging with patient today - ESR-128 -PT/OT for d/c planning  -please encourage patient to use incentive spirometer few times each hour while hospitalized.   -patient on cefepime and zithromax - reports feeling better already -possible chemotherapy related inflammatory changes which should improve with steroids over time.    Severe anemia   - contributing to chronic dyspnea   - heme/onc on case appreciate input  Advanced COPD  -duoneb q4h prn -dulera Steroids and antibiotics per  generric copd carepath -can dc xopenex  -can dc albuterol   Cancer associated anorexia    Patient shares his appetite is now improved with marinol      Thank you for allowing me to participate in the care of this patient.  Patient has at least 1 severe active medical problem which is life threatening and is being managed actively by me.  Patient/Family are satisfied with care plan and all questions have been answered.  This document was prepared using Dragon voice recognition software and may include unintentional dictation errors.     Ottie Glazier, M.D.  Division of [place]

## 2021-12-02 NOTE — Evaluation (Signed)
Physical Therapy Evaluation ?Patient Details ?Name: Rodney Vega ?MRN: [id number] ?DOB: 04-19-1956 ?Today's Date: 12/02/2021 ? ?History of Present Illness ? 66 y.o.  male with longstanding history of smoking; non-small cell lung cancer right upper lobe stage III status post chemoradiation; diabetes; right-sided colon cancer managed conservatively is currently admitted to hospital for progressive shortness of breath  ?Clinical Impression ? Pt did well with ambulation w/o AD and though he had some fatigue he was able to ambulate 75 ft x 2 on room air with O2 remaining in the 90s during the effort.  He did not have any LOBs or overt safety issues, he had some fatigue but feels that he is near his baseline regarding mobility and is not interested in having further f/u with PT here or in the home.  PT agrees that he is safe to return home, no further needs. ?   ? ?Recommendations for follow up therapy are one component of a multi-disciplinary discharge planning process, led by the attending physician.  Recommendations may be updated based on patient status, additional functional criteria and insurance authorization. ? ?Follow Up Recommendations No PT follow up ? ?  ?Assistance Recommended at Discharge PRN  ?Patient can return home with the following ?   ? ?  ?Equipment Recommendations Rolling walker (2 wheels)  ?Recommendations for Other Services ?    ?  ?Functional Status Assessment Patient has had a recent decline in their functional status and demonstrates the ability to make significant improvements in function in a reasonable and predictable amount of time.  ? ?  ?Precautions / Restrictions Precautions ?Precautions: Fall ?Restrictions ?Weight Bearing Restrictions: No  ? ?  ? ?Mobility ? Bed Mobility ?Overal bed mobility: Independent ?  ?  ?  ?  ?  ?  ?General bed mobility comments: Able to get to EOB w/o hesitation ?  ? ?Transfers ?Overall transfer level: Modified independent ?Equipment used: None ?  ?  ?  ?  ?  ?  ?   ?General transfer comment: Pt able to rise to standing without hesitation and showed good confidence and safety ?  ? ?Ambulation/Gait ?Ambulation/Gait assistance: Supervision ?Gait Distance (Feet): 75 Feet ?Assistive device: None ?  ?  ?  ?  ?General Gait Details: Pt was able to ambulate w/o AD and showed good effort/confidence.  On room air t/o the session with sats remaining in the 90s (low 90s post ambulation). ? ?Stairs ?  ?  ?  ?  ?  ? ?Wheelchair Mobility ?  ? ?Modified Rankin (Stroke Patients Only) ?  ? ?  ? ?Balance Overall balance assessment: Modified Independent ?  ?  ?  ?  ?  ?  ?  ?  ?  ?  ?  ?  ?  ?  ?  ?  ?  ?  ?   ? ? ? ?Pertinent Vitals/Pain Pain Assessment ?Pain Assessment: No/denies pain  ? ? ?Home Living Family/patient expects to be discharged to:: Private residence ?Living Arrangements: Spouse/significant other ?Available Help at Discharge: Family ?Type of Home: House ?Home Access: Stairs to enter ?  ?Entrance Stairs-Number of Steps: 3 ?  ?  ?Home Equipment: Conservation officer, nature (2 wheels);Cane - single point ?   ?  ?Prior Function Prior Level of Function : Independent/Modified Independent ?  ?  ?  ?  ?  ?  ?Mobility Comments: able to be active in the community without AD ?  ?  ? ? ?Hand Dominance  ?   ? ?  ?  Extremity/Trunk Assessment  ? Upper Extremity Assessment ?Upper Extremity Assessment: Overall WFL for tasks assessed ?  ? ?Lower Extremity Assessment ?Lower Extremity Assessment: Overall WFL for tasks assessed ?  ? ?   ?Communication  ? Communication: No difficulties  ?Cognition Arousal/Alertness: Awake/alert ?Behavior During Therapy: Restless ?Overall Cognitive Status: Within Functional Limits for tasks assessed ?  ?  ?  ?  ?  ?  ?  ?  ?  ?  ?  ?  ?  ?  ?  ?  ?  ?  ?  ? ?  ?General Comments General comments (skin integrity, edema, etc.): Pt did well with mobility, reports feeling that he is near his baseline. ? ?  ?Exercises    ? ?Assessment/Plan  ?  ?PT Assessment Patient does not need any  further PT services  ?PT Problem List   ? ?   ?  ?PT Treatment Interventions     ? ?PT Goals (Current goals can be found in the Care Plan section)  ?Acute Rehab PT Goals ?Patient Stated Goal: Go home ?PT Goal Formulation: All assessment and education complete, DC therapy ? ?  ?Frequency   ?  ? ? ?Co-evaluation   ?  ?  ?  ?  ? ? ?  ?AM-PAC PT "6 Clicks" Mobility  ?Outcome Measure Help needed turning from your back to your side while in a flat bed without using bedrails?: None ?Help needed moving from lying on your back to sitting on the side of a flat bed without using bedrails?: None ?Help needed moving to and from a bed to a chair (including a wheelchair)?: None ?Help needed standing up from a chair using your arms (e.g., wheelchair or bedside chair)?: None ?Help needed to walk in hospital room?: None ?Help needed climbing 3-5 steps with a railing? : None ?6 Click Score: 24 ? ?  ?End of Session Equipment Utilized During Treatment: Gait belt ?Activity Tolerance: Patient tolerated treatment well ?Patient left: in chair;with nursing/sitter in room ?Nurse Communication: Mobility status ?PT Visit Diagnosis: Muscle weakness (generalized) (M62.81);Difficulty in walking, not elsewhere classified (R26.2) ?  ? ?Time: [phone] ?PT Time Calculation (min) (ACUTE ONLY): 24 min ? ? ?Charges:   PT Evaluation ?$PT Eval Low Complexity: 1 Low ?PT Treatments ?$Gait Training: 8-22 mins ?  ?   ? ? ?Kreg Shropshire, DPT ?12/02/2021, 1:17 PM ? ?

## 2021-12-02 NOTE — Progress Notes (Signed)
Rodney Vega   DOB:Oct 07, 1955   YY#:[id number]   ? ?Subjective: No acute events overnight.  No fevers.  Slightly improved appetite.  No nausea or vomiting.  Continues to have shortness of breath on minimal exertion.  However he wants to off the bed.  Wife by the bedside. ? ?Objective:  ?Vitals:  ? 12/02/21 1310 12/02/21 1315  ?BP: 124/72 124/72  ?Pulse:    ?Resp: 17   ?Temp: 98.6 ?F (37 ?C) 98.6 ?F (37 ?C)  ?SpO2: 95% 95%  ?  ? ?Intake/Output Summary (Last 24 hours) at 12/02/2021 1442 ?Last data filed at 12/02/2021 1057 ?Gross per 24 hour  ?Intake 120 ml  ?Output 1710 ml  ?Net -1590 ml  ? ? ?Physical Exam ?Vitals and nursing note reviewed.  ?HENT:  ?   Head: Normocephalic and atraumatic.  ?   Mouth/Throat:  ?   Pharynx: Oropharynx is clear.  ?Eyes:  ?   Extraocular Movements: Extraocular movements intact.  ?   Pupils: Pupils are equal, round, and reactive to light.  ?Cardiovascular:  ?   Rate and Rhythm: Normal rate and regular rhythm.  ?Pulmonary:  ?   Comments: Decreased breath sounds bilaterally.  Bilateral wheezing noted. ?Abdominal:  ?   Palpations: Abdomen is soft.  ?Musculoskeletal:     ?   General: Normal range of motion.  ?   Cervical back: Normal range of motion.  ?Skin: ?   General: Skin is warm.  ?Neurological:  ?   General: No focal deficit present.  ?   Mental Status: He is alert and oriented to person, place, and time.  ?Psychiatric:     ?   Behavior: Behavior normal.     ?   Judgment: Judgment normal.  ? ?  ?Labs:  ?Lab Results  ?Component Value Date  ? WBC 10.6 (H) 12/02/2021  ? HGB 7.6 (L) 12/02/2021  ? HCT 25.4 (L) 12/02/2021  ? MCV 88.8 12/02/2021  ? PLT 217 12/02/2021  ? NEUTROABS 9.6 (H) 12/01/2021  ? ? ?Lab Results  ?Component Value Date  ? NA 140 12/02/2021  ? K 3.5 12/02/2021  ? CL 104 12/02/2021  ? CO2 27 12/02/2021  ? ? ?Studies:  ?No results found. ?  ?#66 year old male patient with a history of stage III lung cancer currently status post chemoradiation is currently admitted to hospital for  acute respiratory failure ?  ?#Acute respiratory failure-multifactorial underlying malignancy/COPD exacerbation/pneumonia question aspiration-CT scan chest-radiation changes/bronchopleural fistula; possibly aspiration.  Currently on antibiotics/steroids.  Slight improvement.  Appreciate the recommendations from pulmonary, Dr.A.  ?  ?#Lung cancer-stage III status post chemoradiation-CT scan chest shows no obvious progression of disease-however progression of disease cannot be refuted the context of treatment changes/infection.  We will reviewed the imaging at the tumor conference on 3/02.  ?  ?#Severe anemia-likely secondary to anemia of chronic disease/iron deficiency-hemoglobin 7.6 today.  Proceed with 1 unit of PRBC transfusion.  ? ?#Cecal mass-positive for colon cancer biopsy-currently managed conservatively given above acute issues. ?  ?#The above plan of care was discussed with the patient and wife at the bedside.  Also discussed with pulmonary/hospitalist service. ? ? ?[name], MD ?12/02/2021  2:42 PM ? ? ?

## 2021-12-02 NOTE — Progress Notes (Signed)
?Progress Note ? ? ?Patient: Rodney Vega JME:[id number] DOB: [birth year] DOA: 11/29/2021     3 ?DOS: the patient was seen and examined on 12/02/2021 ?  ?Brief hospital course: ?Diyari Cherne is a 66 y.o. 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  Reported intermittent nausea, no vomiting, attributed to chemo/immunotherapy. ? ?Admitted for IV antibiotics after chest x-ray shows a large area of consolidation in the right upper lobe with central necrosis corresponding to known malignancy while showing increasing airspace opacity of the right upper lobe as well as diffuse interstitial nodularity involving the right lung, which is new, concerning for metastatic disease versus lymphangitis carcinomatosis versus pneumonia. ? ?Patient met sepsis criteria on admission with leukocytosis, tachycardia, tachypnea in the setting of likely pneumonia. ? ?Assessment and Plan: ?* Severe sepsis ([place])- (present on admission) ?Present on admission with leukocytosis, tachycardia, tachypnea in setting of PNA.  Organ dysfunction and severe sepsis evidenced by acute hypoxic respiratory failure.  Continue IV antibiotics for PNA. ?Currently off IV fluids.  Follow cultures. ? ?Cecum mass- (present on admission) ?positive for colon cancer biopsy-currently managed conservatively given above acute issues. ?--oncology following ? ?Chronic anemia- (present on admission) ?Of chronic disease and iron def ?--1u pRBC today, per oncology ? ?Inadequate oral intake ?Related to his lung cancer, intermittent nausea related to immunotherapy and overall poor appetite.  Recently started on Megace, notes not noticing improvement.  Started on Marinol. ?Dietitian following. ?Ensures and vitamins per  dietitian. ? ?Malnutrition of moderate degree ?related to chronic illness (lung cancer) as evidenced by mild fat depletion, moderate fat depletion, mild muscle depletion, moderate muscle depletion ?-- Diet nutrition following, appreciate recommendations ?-- Patient on appetite stimulant Marinol ?-- Continue ensures and multivitamin ? ?Acute respiratory failure with hypoxia ([place])- (present on admission) ?Due to PNA and lung cancer underlying. ?Presented with dyspnea, cough, chest xray with RUL airspace disease surrounding the known mass, with O2 sat in upper 80's on room air, needed 2-3L O2 ?--back on room air today ?--Treat PNA as outlined ?--Incentive spirometer ? ?Depression- (present on admission) ?Continue Effexor ? ?Hypokalemia- (present on admission) ?POA, resolved with replacement. ?Monitor and replace K as needed. ? ?SOB (shortness of breath)- (present on admission) ?Due to PNA.  Mgmt as outlined. ?Monitor closely. ? ?CAP (community acquired pneumonia)- (present on admission) ?POA, with underlying RUL lung cancer.  ?--Continue Cefepime and Zithromax ?--Supportive care with mucolytics, o2 support PRN, antipyretics and analgesia as needed ?--Sputum culture ? ? ?Hypertension- (present on admission) ?Chronic. BP's stable, mildly soft at times. ?Hold home losartan for now ? ?Diabetes mellitus without complication ([place]) ?Sliding scale Novolog.  Hold glipizide. ? ?Primary cancer of right upper lobe of lung ([place])- (present on admission) ?Follows with Dr. Rogue Bussing, consulted. ?On immunotherapy.  Had chemo and XRT in the past.  Follow up oncology recommendations.  CT chest with contrast was planned for 3/1, ordered.  ? ?Colostomy in place [facility]) ?No acute issues.  Monitor. ? ? ? ? ? ? ? ?Subjective:  ?Pt reported breathing improved, appetite improved, and he said he was ready to go home. ? ? ?Physical Exam: ?Vitals:  ? 12/02/21 1232 12/02/21 1310 12/02/21 1315 12/02/21 1528  ?BP: 134/73 124/72 124/72 137/77  ?Pulse:  (!) 101   99  ?Resp: _0 ?  Temp: 98.2 ?F (36.8 ?C) 98.6 ?F (37 ?C) 98.6 ?F (37 ?C) 98.2 ?F (36.8 ?C)  ?TempSrc: Oral Oral Oral Oral  ?SpO2: 96% 95% 95% 94%  ?Weight:      ?Height:      ? ?Constitutional: NAD, AAOx3 ?HEENT: conjunctivae and lids normal, EOMI ?CV: No cyanosis.   ?RESP: normal respiratory effort, on RA ?Neuro: II - XII grossly intact.   ?Psych: Normal mood and affect.  Appropriate judgement and reason ? ? ?Data Reviewed: ? ?Family Communication:  ?Disposition: ?Status is: Inpatient ?Remains inpatient appropriate because: Severity of illness with ongoing hypoxia requiring supplemental oxygen and on IV antibiotics. ? ? ? Planned Discharge Destination: Home ? ? ? ? ?Time spent: 35 minutes ? ?Author: ?[name], MD ?12/02/2021 7:35 PM ? ?For on call review [email or web address].  ? ?

## 2021-12-02 NOTE — Assessment & Plan Note (Signed)
Of chronic disease and iron def ?--1u pRBC today, per oncology ?

## 2021-12-03 ENCOUNTER — Inpatient Hospital Stay: Payer: Medicare PPO | Attending: Internal Medicine

## 2021-12-03 ENCOUNTER — Inpatient Hospital Stay: Payer: Medicare PPO

## 2021-12-03 DIAGNOSIS — Z87442 Personal history of urinary calculi: Secondary | ICD-10-CM | POA: Insufficient documentation

## 2021-12-03 DIAGNOSIS — Z9221 Personal history of antineoplastic chemotherapy: Secondary | ICD-10-CM | POA: Insufficient documentation

## 2021-12-03 DIAGNOSIS — R5383 Other fatigue: Secondary | ICD-10-CM | POA: Insufficient documentation

## 2021-12-03 DIAGNOSIS — D509 Iron deficiency anemia, unspecified: Secondary | ICD-10-CM | POA: Insufficient documentation

## 2021-12-03 DIAGNOSIS — Z7984 Long term (current) use of oral hypoglycemic drugs: Secondary | ICD-10-CM | POA: Insufficient documentation

## 2021-12-03 DIAGNOSIS — I10 Essential (primary) hypertension: Secondary | ICD-10-CM | POA: Insufficient documentation

## 2021-12-03 DIAGNOSIS — Z79899 Other long term (current) drug therapy: Secondary | ICD-10-CM | POA: Insufficient documentation

## 2021-12-03 DIAGNOSIS — I700 Atherosclerosis of aorta: Secondary | ICD-10-CM | POA: Insufficient documentation

## 2021-12-03 DIAGNOSIS — Z923 Personal history of irradiation: Secondary | ICD-10-CM | POA: Insufficient documentation

## 2021-12-03 DIAGNOSIS — G893 Neoplasm related pain (acute) (chronic): Secondary | ICD-10-CM | POA: Insufficient documentation

## 2021-12-03 DIAGNOSIS — K5900 Constipation, unspecified: Secondary | ICD-10-CM | POA: Insufficient documentation

## 2021-12-03 DIAGNOSIS — A419 Sepsis, unspecified organism: Secondary | ICD-10-CM | POA: Insufficient documentation

## 2021-12-03 DIAGNOSIS — C3411 Malignant neoplasm of upper lobe, right bronchus or lung: Secondary | ICD-10-CM | POA: Insufficient documentation

## 2021-12-03 DIAGNOSIS — F17210 Nicotine dependence, cigarettes, uncomplicated: Secondary | ICD-10-CM | POA: Insufficient documentation

## 2021-12-03 DIAGNOSIS — E46 Unspecified protein-calorie malnutrition: Secondary | ICD-10-CM | POA: Insufficient documentation

## 2021-12-03 DIAGNOSIS — E1165 Type 2 diabetes mellitus with hyperglycemia: Secondary | ICD-10-CM | POA: Insufficient documentation

## 2021-12-03 DIAGNOSIS — J432 Centrilobular emphysema: Secondary | ICD-10-CM | POA: Insufficient documentation

## 2021-12-03 DIAGNOSIS — E119 Type 2 diabetes mellitus without complications: Secondary | ICD-10-CM | POA: Insufficient documentation

## 2021-12-03 DIAGNOSIS — R42 Dizziness and giddiness: Secondary | ICD-10-CM | POA: Insufficient documentation

## 2021-12-03 DIAGNOSIS — E876 Hypokalemia: Secondary | ICD-10-CM | POA: Insufficient documentation

## 2021-12-03 DIAGNOSIS — F4320 Adjustment disorder, unspecified: Secondary | ICD-10-CM | POA: Insufficient documentation

## 2021-12-03 DIAGNOSIS — I2510 Atherosclerotic heart disease of native coronary artery without angina pectoris: Secondary | ICD-10-CM | POA: Insufficient documentation

## 2021-12-03 DIAGNOSIS — Z8042 Family history of malignant neoplasm of prostate: Secondary | ICD-10-CM | POA: Insufficient documentation

## 2021-12-03 DIAGNOSIS — R1900 Intra-abdominal and pelvic swelling, mass and lump, unspecified site: Secondary | ICD-10-CM | POA: Insufficient documentation

## 2021-12-03 LAB — BPAM RBC
Blood Product Expiration Date: 202303192359
ISSUE DATE / TIME: 202303011247
Unit Type and Rh: 7300

## 2021-12-03 LAB — CBC
HCT: 29.5 % — ABNORMAL LOW (ref 39.0–52.0)
Hemoglobin: 9.4 g/dL — ABNORMAL LOW (ref 13.0–17.0)
MCH: 28.2 pg (ref 26.0–34.0)
MCHC: 31.9 g/dL (ref 30.0–36.0)
MCV: 88.6 fL (ref 80.0–100.0)
Platelets: 246 10*3/uL (ref 150–400)
RBC: 3.33 MIL/uL — ABNORMAL LOW (ref 4.22–5.81)
RDW: 16.6 % — ABNORMAL HIGH (ref 11.5–15.5)
WBC: 10.1 10*3/uL (ref 4.0–10.5)
nRBC: 0 % (ref 0.0–0.2)

## 2021-12-03 LAB — BASIC METABOLIC PANEL
Anion gap: 9 (ref 5–15)
BUN: 15 mg/dL (ref 8–23)
CO2: 24 mmol/L (ref 22–32)
Calcium: 8.8 mg/dL — ABNORMAL LOW (ref 8.9–10.3)
Chloride: 106 mmol/L (ref 98–111)
Creatinine, Ser: 0.67 mg/dL (ref 0.61–1.24)
GFR, Estimated: >60 mL/min (ref >60)
Glucose, Bld: 202 mg/dL — ABNORMAL HIGH (ref 70–99)
Potassium: 4.3 mmol/L (ref 3.5–5.1)
Sodium: 139 mmol/L (ref 135–145)

## 2021-12-03 LAB — HIV-1/HIV-2 QUALITATIVE RNA
Final Interpretation: NEGATIVE
HIV-1 RNA, Qualitative: NONREACTIVE
HIV-2 RNA, Qualitative: NONREACTIVE

## 2021-12-03 LAB — GLUCOSE, CAPILLARY
Glucose-Capillary: 135 mg/dL — ABNORMAL HIGH (ref 70–99)
Glucose-Capillary: 167 mg/dL — ABNORMAL HIGH (ref 70–99)
Glucose-Capillary: 235 mg/dL — ABNORMAL HIGH (ref 70–99)

## 2021-12-03 LAB — HIV-1/2 AB - DIFFERENTIATION
HIV 1 Ab: NONREACTIVE
HIV 2 Ab: NONREACTIVE
Note: NEGATIVE

## 2021-12-03 LAB — MAGNESIUM: Magnesium: 2 mg/dL (ref 1.7–2.4)

## 2021-12-03 LAB — TYPE AND SCREEN
ABO/RH(D): B POS
Antibody Screen: NEGATIVE
Unit division: 0

## 2021-12-03 LAB — CULTURE, RESPIRATORY W GRAM STAIN
Culture: NORMAL
Gram Stain: NONE SEEN

## 2021-12-03 MED ORDER — DM-GUAIFENESIN ER 30-600 MG PO TB12: 1.0000 | ORAL_TABLET | Freq: Two times a day (BID) | ORAL | Status: DC | PRN

## 2021-12-03 MED ORDER — PREDNISONE 20 MG PO TABS
40.0000 mg | ORAL_TABLET | Freq: Every day | ORAL | Status: DC
  Administered 2021-12-03: 40 mg via ORAL
  Filled 2021-12-03: qty 2

## 2021-12-03 MED ORDER — ADULT MULTIVITAMIN W/MINERALS CH: 1.0000 | ORAL_TABLET | Freq: Every day | ORAL | Status: DC

## 2021-12-03 MED ORDER — AMOXICILLIN-POT CLAVULANATE 875-125 MG PO TABS: 1.0000 | ORAL_TABLET | Freq: Two times a day (BID) | ORAL | 0 refills | Status: AC

## 2021-12-03 MED ORDER — DRONABINOL 5 MG PO CAPS: 5.0000 mg | ORAL_CAPSULE | Freq: Two times a day (BID) | ORAL | 0 refills | Status: AC

## 2021-12-03 MED ORDER — PREDNISONE 5 MG PO TABS: ORAL_TABLET | ORAL | 0 refills | Status: DC

## 2021-12-03 MED ORDER — ENSURE ENLIVE PO LIQD: 237.0000 mL | Freq: Three times a day (TID) | ORAL | Status: DC

## 2021-12-03 MED ORDER — AMOXICILLIN-POT CLAVULANATE 875-125 MG PO TABS
1.0000 | ORAL_TABLET | Freq: Two times a day (BID) | ORAL | Status: DC
  Administered 2021-12-03: 1 via ORAL
  Filled 2021-12-03: qty 1

## 2021-12-03 MED ORDER — ACETAMINOPHEN 500 MG PO TABS: 1000.0000 mg | ORAL_TABLET | Freq: Every day | ORAL | 0 refills | Status: DC | PRN

## 2021-12-03 NOTE — Progress Notes (Signed)
Tumor Board Documentation ? ?Rodney Vega was presented by Dr Rogue Bussing at our Tumor Board on 12/03/2021, which included representatives from medical oncology, surgical, pharmacy, pulmonology, genetics, radiology, pathology, nutrition, research, navigation, radiation oncology, internal medicine, palliative care. ? ?Rodney Vega currently presents as a current patient, for discussion with history of the following treatments: active survellience, adjuvant chemotherapy. ? ?Additionally, we reviewed previous medical and familial history, history of present illness, and recent lab results along with all available histopathologic and imaging studies. The tumor board considered available treatment options and made the following recommendations: ?Active surveillance ?Dr will have discussion with patient regarding his desires to do any treatments ? ?The following procedures/referrals were also placed: No orders of the defined types were placed in this encounter. ? ? ?Clinical Trial Status: not discussed  ? ?Staging used: AJCC Stage Group ?AJCC Staging: ?T: 3b ?N: 2 ?  ?Group: Progressive RUL lung caner ? ? ?National site-specific guidelines   were discussed with respect to the case. ? ?Tumor board is a meeting of clinicians from various specialty areas who evaluate and discuss patients for whom a multidisciplinary approach is being considered. Final determinations in the plan of care are those of the provider(s). The responsibility for follow up of recommendations given during tumor board is that of the provider.  ? ?Today?s extended care, comprehensive team conference, Rodney Vega was not present for the discussion and was not examined.  ? ?Multidisciplinary Tumor Board is a multidisciplinary case peer review process.  Decisions discussed in the Multidisciplinary Tumor Board reflect the opinions of the specialists present at the conference without having examined the patient.  Ultimately, treatment and diagnostic decisions rest with  the primary provider(s) and the patient. ? ?

## 2021-12-03 NOTE — Progress Notes (Signed)
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 He had chest CT done which I reviewed independently showing cavitary RUL process post cancer treatment and multifocal infiltrates. He was seen by oncologist while hospitalized and current chemotherapy is paused.  He is being treated for pneumonia and is receiving broad spectrum antibiotics with steroids. PCCM consultation for further evaluation and management.    PAST MEDICAL HISTORY   Past Medical History:  Diagnosis Date   Cancer [facility])    Degenerative arthritis of knee    Depression    Diabetes mellitus without complication ([place])    Diverticulitis    Glaucoma    since 2004   Hernia 1979   History of kidney stones    Hypertension    Personal history of tobacco use, presenting hazards to health    Pre-diabetes    Screening for obesity    Special screening for malignant neoplasms, colon    Wears dentures    full upper and lower     SURGICAL HISTORY   Past Surgical History:  Procedure Laterality Date   [place], 2012   lumbar bulging disc   CATARACT EXTRACTION W/PHACO Left 01/20/2021   Procedure: CATARACT EXTRACTION PHACO AND INTRAOCULAR LENS PLACEMENT ([place]) LEFT;  Surgeon: Birder Robson, MD;  Location: [place];  Service: Ophthalmology;  Laterality: Left;  10.13 0:52.1   COLONOSCOPY  2025,4270   [facility]/Dr. Buelah Manis sessile polyp,46mm, found  in rectum,multiple diverticula were found in the sigmoid, in descending and in transverse colon   COLONOSCOPY WITH PROPOFOL N/A 10/22/2021   Procedure: COLONOSCOPY WITH PROPOFOL;  Surgeon: Annamaria Helling, DO;  Location: [facility] ENDOSCOPY;  Service: Gastroenterology;  Laterality: N/A;  DM   COLOSTOMY  04-20-13   HERNIA REPAIR  1979   inguinal   IR IMAGING GUIDED PORT INSERTION  06/10/2021   JOINT REPLACEMENT Right    knee   KNEE ARTHROPLASTY Right 05/08/2018   Procedure: COMPUTER ASSISTED TOTAL KNEE ARTHROPLASTY;  Surgeon: Dereck Leep, MD;  Location: [facility] ORS;  Service: Orthopedics;  Laterality: Right;   KNEE ARTHROPLASTY Left 11/16/2019   Procedure: COMPUTER ASSISTED TOTAL KNEE ARTHROPLASTY;  Surgeon: Dereck Leep, MD;  Location: [facility] ORS;  Service: Orthopedics;  Laterality: Left;   LAPAROTOMY  04-20-13   colon resection with colosotmy   LITHOTRIPSY  2013   TONSILLECTOMY AND ADENOIDECTOMY  1962     FAMILY HISTORY   Family History  Problem Relation Age of Onset   Diabetes Mother    Heart disease Mother    Heart attack Father    Prostate cancer Father      SOCIAL HISTORY   Social History   Tobacco Use   Smoking status: Every Day    Packs/day: 1.00    Years: 30.00    Pack years: 30.00    Types: Cigarettes  Smokeless tobacco: Never   Tobacco comments:    since age 2 or 80  Vaping Use   Vaping Use: Never used  Substance Use Topics   Alcohol use: Yes    Alcohol/week: 2.0 standard drinks    Types: 2 Standard drinks or equivalent per week    Comment: 1-2 drinks per week   Drug use: Yes    Types: Marijuana     MEDICATIONS    Home Medication:    Current Medication:  Current Facility-Administered Medications:    acetaminophen (TYLENOL) tablet 650 mg, 650 mg, Oral, Q6H PRN, 650 mg at 12/02/21 2256 **OR** acetaminophen (TYLENOL) suppository 650 mg, 650 mg, Rectal, Q6H PRN, Howerter, Justin B, DO   azithromycin (ZITHROMAX) 500 mg in sodium  chloride 0.9 % 250 mL IVPB, 500 mg, Intravenous, Q24H, Howerter, Justin B, DO, Last Rate: 250 mL/hr at 12/02/21 2248, 500 mg at 12/02/21 2248   ceFEPIme (MAXIPIME) 2 g in sodium chloride 0.9 % 100 mL IVPB, 2 g, Intravenous, Q8H, Nazari, Walid A, RPH, Last Rate: 200 mL/hr at 12/03/21 0539, 2 g at 12/03/21 0539   chlorpheniramine-HYDROcodone 10-8 MG/5ML suspension 5 mL, 5 mL, Oral, Q12H PRN, Nicole Kindred A, DO, 5 mL at 12/02/21 1829   dextromethorphan-guaiFENesin ([place] DM) 30-600 MG per 12 hr tablet 1 tablet, 1 tablet, Oral, BID, Nicole Kindred A, DO, 1 tablet at 12/02/21 0928   dorzolamide-timolol (COSOPT) 22.3-6.8 MG/ML ophthalmic solution 1 drop, 1 drop, Both Eyes, BID, Howerter, Justin B, DO, 1 drop at 12/02/21 2214   dronabinol (MARINOL) capsule 5 mg, 5 mg, Oral, BID AC, Griffith, Kelly A, DO, 5 mg at 12/02/21 1648   feeding supplement (ENSURE ENLIVE / ENSURE PLUS) liquid 237 mL, 237 mL, Oral, TID BM, [name], Kelly A, DO, 237 mL at 12/02/21 2217   insulin aspart (novoLOG) injection 0-9 Units, 0-9 Units, Subcutaneous, TID WC, Howerter, Justin B, DO, 1 Units at 12/02/21 1648   ipratropium-albuterol (DUONEB) 0.5-2.5 (3) MG/3ML nebulizer solution 3 mL, 3 mL, Nebulization, Q4H PRN, Nicole Kindred A, DO, 3 mL at 12/02/21 2222   latanoprost (XALATAN) 0.005 % ophthalmic solution 1 drop, 1 drop, Both Eyes, QHS, Howerter, Justin B, DO, 1 drop at 12/02/21 2215   lidocaine (LIDODERM) 5 % 1 patch, 1 patch, Transdermal, Q24H, Nicole Kindred A, DO, 1 patch at 11/30/21 1403   loratadine (CLARITIN) tablet 10 mg, 10 mg, Oral, Daily, Howerter, Justin B, DO, 10 mg at 12/02/21 9371   mometasone-formoterol (DULERA) 200-5 MCG/ACT inhaler 2 puff, 2 puff, Inhalation, BID, Nicole Kindred A, DO, 2 puff at 12/02/21 2216   montelukast (SINGULAIR) tablet 10 mg, 10 mg, Oral, QHS, Howerter, Justin B, DO, 10 mg at 12/02/21 2213   multivitamin with minerals tablet 1 tablet, 1 tablet, Oral, Daily, Nicole Kindred A, DO, 1 tablet at 12/02/21 0926   ondansetron (ZOFRAN) injection 4 mg, 4 mg, Intravenous, Q6H PRN, Howerter, Justin B, DO, 4 mg at 12/01/21 1402   predniSONE (DELTASONE) tablet 40 mg, 40 mg, Oral, Q breakfast, [name], MD, 40 mg at 12/02/21 2213   pregabalin (LYRICA) capsule 50 mg, 50 mg, Oral, BID, Howerter, Justin B, DO, 50 mg at 12/02/21 2213   tamsulosin (FLOMAX) capsule 0.4 mg, 0.4 mg, Oral, Daily, Nicole Kindred A, DO, 0.4 mg at 12/02/21 2213   traMADol (ULTRAM) tablet 100 mg, 100 mg, Oral, Q6H PRN, Howerter, Justin B, DO   venlafaxine XR (EFFEXOR-XR) 24 hr capsule 225 mg, 225 mg, Oral, Daily, Howerter, Justin B, DO,  225 mg at 12/02/21 8592  Facility-Administered Medications Ordered in Other Encounters:    heparin lock flush 100 UNIT/ML injection, , , ,    heparin lock flush 100 UNIT/ML injection, , , ,    heparin lock flush 100 UNIT/ML injection, , , ,     ALLERGIES   Paclitaxel, Ambien [zolpidem], Nsaids, and Aleve [naproxen sodium]     REVIEW OF SYSTEMS    Review of Systems:  Gen:  Denies  fever, sweats, chills weigh loss  HEENT: Denies blurred vision, double vision, ear pain, eye pain, hearing loss, nose bleeds, sore throat Cardiac:  No dizziness, chest pain or heaviness, chest tightness,edema Resp:   reports phlegm on expectoration with brown debris and green debri Gi: Denies swallowing difficulty, stomach pain, nausea or vomiting, diarrhea, constipation, bowel incontinence Gu:  Denies bladder incontinence, burning urine Ext:   Denies Joint pain, stiffness or swelling Skin: Denies  skin rash, easy bruising or bleeding or hives Endoc:  Denies polyuria, polydipsia , polyphagia or weight change Psych:   Denies depression, insomnia or hallucinations   Other:  All other systems negative   VS: BP (!) 144/68 (BP Location: Right Arm)    Pulse (!) 105    Temp 97.9 F (36.6 C) (Oral)    Resp 18    Ht $R'5\' 11"'zm$  (1.803 m)    Wt 85.3 kg    SpO2 100%    BMI  26.22 kg/m      PHYSICAL EXAM    GENERAL:NAD, no fevers, chills, no weakness no fatigue HEAD: Normocephalic, atraumatic.  EYES: Pupils equal, round, reactive to light. Extraocular muscles intact. No scleral icterus.  MOUTH: Moist mucosal membrane. Dentition intact. No abscess noted.  EAR, NOSE, THROAT: Clear without exudates. No external lesions.  NECK: Supple. No thyromegaly. No nodules. No JVD.  PULMONARY: rhonchi worse on right CARDIOVASCULAR: S1 and S2. Regular rate and rhythm. No murmurs, rubs, or gallops. No edema. Pedal pulses 2+ bilaterally.  GASTROINTESTINAL: Soft, nontender, nondistended. No masses. Positive bowel sounds. No hepatosplenomegaly.  MUSCULOSKELETAL: No swelling, clubbing, or edema. Range of motion full in all extremities.  NEUROLOGIC: Cranial nerves II through XII are intact. No gross focal neurological deficits. Sensation intact. Reflexes intact.  SKIN: No ulceration, lesions, rashes, or cyanosis. Skin warm and dry. Turgor intact.  PSYCHIATRIC: Mood, affect within normal limits. The patient is awake, alert and oriented x 3. Insight, judgment intact.       IMAGING    DG Chest 1 View  Result Date: 11/29/2021 CLINICAL DATA:  Shortness of breath. Metastatic non-small cell lung cancer. EXAM: CHEST  1 VIEW COMPARISON:  Chest radiograph dated 09/18/2021 and CT dated 09/09/2021 FINDINGS: Left-sided Port-A-Cath in similar position. Large area of consolidation in the right upper lobe/right apex with central necrosis corresponding to the known malignancy. Overall increase in the opacity with decreased aeration of the right upper lobe compared to prior radiograph. Additionally there is diffuse interstitial nodularity primarily involving the right lung which is new compared to prior radiograph suspicious for metastatic disease or lymphangitic carcinomatosis. Atypical pneumonia is less likely but not excluded. No large pleural effusion. No pneumothorax. Stable  cardiomediastinal silhouette. No acute osseous pathology. IMPRESSION: Progression of right upper lobe centrally necrotic mass with findings suggestive of pulmonary metastatic disease or lymphangitic carcinomatosis, new since the prior radiograph. Electronically Signed   By: Anner Crete M.D.   On: 11/29/2021 21:38   CT CHEST W CONTRAST  Result Date: 11/30/2021 CLINICAL DATA:  Small cell lung  cancer EXAM: CT CHEST WITH CONTRAST TECHNIQUE: Multidetector CT imaging of the chest was performed during intravenous contrast administration. RADIATION DOSE REDUCTION: This exam was performed according to the departmental dose-optimization program which includes automated exposure control, adjustment of the mA and/or kV according to patient size and/or use of iterative reconstruction technique. CONTRAST:  71mL OMNIPAQUE IOHEXOL 300 MG/ML  SOLN COMPARISON:  Chest CT with contrast dated September 09, 2021 FINDINGS: Cardiovascular: Normal heart size. No pericardial effusion. Coronary artery calcifications of the LAD and circumflex. Mediastinum/Nodes: Patulous esophagus. Thyroid is unremarkable. Enlarged mediastinal lymph nodes, unchanged compared to prior exam. Unchanged subcentimeter bilateral hilar lymph nodes. Lungs/Pleura: Centrilobular emphysema. 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 Increased right upper lobe collapse. Increased centrilobular nodules of the bilateral lower lungs with associated interlobular septal thickening and more focal areas of consolidation. Increased consolidation and ground-glass opacities of the superior segment of the right lower lobe and paramediastinal left upper lobe, likely post radiation change. Upper Abdomen: Unchanged bilateral adrenal gland nodules, likely benign adenomas given stability on multiple prior  exams. Musculoskeletal: Destructive changes of the right lateral ribs adjacent to the right upper lobe mass, unchanged compared to prior exam. No new destructive osseous lesions. IMPRESSION: 1. 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 Mass is overall similar in size but demonstrates decreased fluid component and increased air component. 2. Increased centrilobular nodules and areas of consolidation which are most prominent in the lower lungs and likely due to aspiration. 3. Unchanged enlarged mediastinal lymph nodes. 4. Increased consolidation and ground-glass opacities of the superior segment of the right lower lobe and paramediastinal left upper lobe, likely evolving postradiation change. 5. Coronary artery calcifications, aortic Atherosclerosis (ICD10-I70.0) and Emphysema (ICD10-J43.9). Electronically Signed   By: Yetta Glassman M.D.   On: 11/30/2021 14:22      ASSESSMENT/PLAN   Acute hypoxemic respiratory failure - present on admission  - COVID19 negative   - supplemental O2 during my evaluation room air -procalcitonin is trending -serum fungitell -legionella ab -strep pneumoniae ur AG -Histoplasma Ur Ag -sputum resp cultures -AFB sputum expectorated specimen -reviewed pertinent imaging with patient today - ESR-128 -PT/OT for d/c planning  -please encourage patient to use incentive spirometer few times each hour while hospitalized.   -patient on cefepime and zithromax - reports feeling better already -possible chemotherapy related inflammatory changes which should improve with steroids over time.    Severe anemia   - contributing to chronic dyspnea   - heme/onc on case appreciate input  Advanced COPD  -duoneb q4h prn -dulera Steroids and antibiotics per generric copd carepath -can dc xopenex  -can dc albuterol   Cancer associated anorexia    Patient shares his appetite is now improved with marinol      Thank you for allowing me to  participate in the care of this patient.  Patient has at least 1 severe active medical problem which is life threatening and is being managed actively by me.  Patient/Family are satisfied with care plan and all questions have been answered.  This document was prepared using Dragon voice recognition software and may include unintentional dictation errors.     Ottie Glazier, M.D.  Division of [place]

## 2021-12-03 NOTE — TOC Initial Note (Signed)
Transition of Care (TOC) - Initial/Assessment Note  ? ? ?Patient Details  ?Name: Rodney Vega ?MRN: [id number] ?Date of Birth: 03-04-1956 ? ?Transition of Care (TOC) CM/SW Contact:    ?Candie Chroman, LCSW ?Phone Number: ?12/03/2021, 10:08 AM ? ?Clinical Narrative:   Readmission prevention screen complete. CSW met with patient. No supports at bedside. CSW introduced role and explained that discharge planning would be discussed. PCP is [name], MD. Patient is able to drive but he said his wife often prefers to transport him. Pharmacy is [facility] in [place]. No issues obtaining medications. No home health or DME use prior to admission. PT recommending a RW but he said he already has one at home. Wife will take him home at discharge. No further concerns. CSW encouraged patient to contact CSW as needed. CSW will continue to follow patient for support and facilitate return home when stable.              ? ?Expected Discharge Plan: Home/Self Care ?Barriers to Discharge: Continued Medical Work up ? ? ?Patient Goals and CMS Choice ?  ?  ?  ? ?Expected Discharge Plan and Services ?Expected Discharge Plan: Home/Self Care ?  ?  ?Post Acute Care Choice: NA ?Living arrangements for the past 2 months: [place] ?                ?  ?  ?  ?  ?  ?  ?  ?  ?  ?  ? ?Prior Living Arrangements/Services ?Living arrangements for the past 2 months: [place] ?Lives with:: Spouse ?Patient language and need for interpreter reviewed:: Yes ?Do you feel safe going back to the place where you live?: Yes      ?Need for Family Participation in Patient Care: Yes (Comment) ?Care giver support system in place?: Yes (comment) ?  ?Criminal Activity/Legal Involvement Pertinent to Current Situation/Hospitalization: No - Comment as needed ? ?Activities of Daily Living ?Home Assistive Devices/Equipment: None ?ADL Screening (condition at time of admission) ?Patient's cognitive ability adequate to safely complete daily activities?:  Yes ?Is the patient deaf or have difficulty hearing?: No ?Does the patient have difficulty seeing, even when wearing glasses/contacts?: No ?Does the patient have difficulty concentrating, remembering, or making decisions?: No ?Patient able to express need for assistance with ADLs?: Yes ?Does the patient have difficulty dressing or bathing?: No ?Independently performs ADLs?: Yes (appropriate for developmental age) ?Does the patient have difficulty walking or climbing stairs?: No ?Weakness of Legs: None ?Weakness of Arms/Hands: None ? ?Permission Sought/Granted ?  ?  ?   ?   ?   ?   ? ?Emotional Assessment ?Appearance:: Appears stated age ?Attitude/Demeanor/Rapport: Engaged ?Affect (typically observed): Appropriate, Calm ?Orientation: : Oriented to Self, Oriented to Place, Oriented to  Time, Oriented to Situation ?Alcohol / Substance Use: Not Applicable ?Psych Involvement: No (comment) ? ?Admission diagnosis:  Hypoxia [R09.02] ?CAP (community acquired pneumonia) [J18.9] ?Severe sepsis ([place]) [A41.9, R65.20] ?Dyspnea, unspecified type [R06.00] ?Community acquired pneumonia, unspecified laterality [J18.9] ?Patient Active Problem List  ? Diagnosis Date Noted  ? Chronic anemia 12/02/2021  ? Cecum mass 12/02/2021  ? Malnutrition of moderate degree 12/01/2021  ? Inadequate oral intake 12/01/2021  ? Severe sepsis ([place]) 11/30/2021  ? SOB (shortness of breath) 11/30/2021  ? Hypokalemia 11/30/2021  ? Acute respiratory failure with hypoxia ([place]) 11/30/2021  ? Depression   ? CAP (community acquired pneumonia) 11/29/2021  ? Diabetes mellitus without complication ([place]) 86/76/7209  ? Hypertension 06/04/2021  ?  Primary cancer of right upper lobe of lung ([place]) 06/03/2021  ? Mass of upper lobe of right lung 05/20/2021  ? 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 Total knee replacement status 11/16/2019  ? Colostomy in place [facility]) 05/08/2018  ? Nephrolithiasis 05/08/2018  ? Status post total right knee replacement 05/08/2018   ? Obesity (BMI 30.0-34.9) 10/11/2017  ? Primary osteoarthritis of both knees 10/11/2017  ? Colostomy status ([place]) 08/27/2013  ? ?PCP:  [name], MD ?Pharmacy:   ?[facility], [address] ?[address] ?[place] [address] ?Phone: [phone] Fax: [phone] ? ?[facility] [place], [place] AT [facility] OF [address] ?[place] ?[place] [address] ?Phone: [phone] Fax: [phone] ? ?[place], [place] ?[address] ?[place] [address] ?Phone: [phone] Fax: [phone] ? ? ? ? ?Social Determinants of Health (SDOH) Interventions ?  ? ?Readmission Risk Interventions ?Readmission Risk Prevention Plan 12/03/2021  ?Transportation Screening Complete  ?PCP or Specialist Appt within 3-5 Days Complete  ?Social Work Consult for [place] Planning/Counseling Complete  ?Palliative Care Screening Not Applicable  ?Medication Review Press photographer) Complete  ?Some recent data might be hidden  ? ? ? ?

## 2021-12-03 NOTE — TOC Transition Note (Signed)
Transition of Care (TOC) - CM/SW Discharge Note ? ? ?Patient Details  ?Name: Rodney Vega ?MRN: [id number] ?Date of Birth: 12-01-1955 ? ?Transition of Care (TOC) CM/SW Contact:  ?Candie Chroman, LCSW ?Phone Number: ?12/03/2021, 11:43 AM ? ? ?Clinical Narrative:   Patient has orders to discharge home today. No further concerns. CSW signing off. ? ?Final next level of care: Home/Self Care ?Barriers to Discharge: No Barriers Identified ? ? ?Patient Goals and CMS Choice ?  ?  ?  ? ?Discharge Placement ?  ?           ?  ?Patient to be transferred to facility by: Wife ?  ?Patient and family notified of of transfer: 12/03/21 ? ?Discharge Plan and Services ?  ?  ?Post Acute Care Choice: NA          ?  ?  ?  ?  ?  ?  ?  ?  ?  ?  ? ?Social Determinants of Health (SDOH) Interventions ?  ? ? ?Readmission Risk Interventions ?Readmission Risk Prevention Plan 12/03/2021  ?Transportation Screening Complete  ?PCP or Specialist Appt within 3-5 Days Complete  ?Social Work Consult for [place] Planning/Counseling Complete  ?Palliative Care Screening Not Applicable  ?Medication Review Press photographer) Complete  ?Some recent data might be hidden  ? ? ? ? ? ?

## 2021-12-03 NOTE — Discharge Summary (Signed)
Physician Discharge Summary   Rodney Vega  male DOB: [birth year]  ASN:[id number]  PCP: [name], MD  Admit date: 11/29/2021 Discharge date: 12/03/2021  Admitted From: home Disposition:  home Wife updated at bedside prior to discharge.  CODE STATUS: Full code  Discharge Instructions     Discharge instructions   Complete by: As directed    Please finish taking antibiotic with augmentin at home as directed.  And also finish taking prednisone taper at home as directed.  Please follow up with Dr. Rogue Bussing and Dr. Lanney Gins in outpatient clinic.   Dr. [name] [facility] Course:  For full details, please see H&P, progress notes, consult notes and ancillary notes.  Briefly,  Rodney Vega is a 66 y.o. 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  Reported intermittent nausea, no vomiting, attributed to chemo/immunotherapy.   Admitted for IV antibiotics after chest x-ray showed a large area of consolidation in the right upper lobe with central necrosis corresponding to known malignancy while showing increasing airspace opacity of the right upper lobe as well as diffuse interstitial nodularity involving the right lung, which is new, concerning for metastatic disease versus lymphangitis carcinomatosis versus pneumonia.   * Severe sepsis ([place])- (present on admission) --with leukocytosis, tachycardia, tachypnea in setting of PNA.  Organ dysfunction and severe sepsis evidenced by acute hypoxic respiratory failure.   CAP (community acquired pneumonia)- (present on admission) POA, with underlying RUL lung cancer.  --pulm consulted.   --pt received 4 days of Cefepime and Zithromax and  was discharged on 5 more days of Augmentin, per pulm rec.    Acute respiratory failure with hypoxia ([place])- (present on admission) Due to PNA and lung cancer. On presentation, O2 sat in upper 80's on room air, needed 2-3L O2.  Was weaned to room air prior to discharge.   Advanced COPD with exacerbation --pulm consulted, with Dr. Lanney Gins, who recommended starting steroid, and steroid taper after discharge (see below).   --Pt discharged on home bronchodilator regimen as below.   --Will follow up with Dr. Lanney Gins as outpatient.  Cecum mass- (present on admission) positive for colon cancer biopsy-currently managed conservatively given above acute issues. 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  Recently started on Megace, notes no improvement.  Started on Marinol during this hospitalization which pt believed helped his appetite more.  Pt discharged on Marinol for 1 month, further refills to be provided by outpatient providers. --Ensures and vitamins per dietitian.   Depression- (present on admission) Continue Effexor   Hypokalemia- (present on admission) POA, resolved with replacement.   Hypertension- (present on admission) BP's stable, mildly soft at times.  Home olmesartan held during hospitalization and resumed after discharge.   Diabetes mellitus without complication ([place]) --resume home glipizide after discharge.   Primary cancer of right upper lobe of lung ([place])- (present on admission) Follows with Dr. Rogue Bussing, consulted while inpatient. On immunotherapy.  Had chemo and XRT in the past.   --follow up  with Dr. Rogue Bussing as outpatient   Colostomy in place [facility]) No acute issues.  Routine ostomy  care.   Discharge Diagnoses:  Principal Problem:   Severe sepsis ([place]) Active Problems:   Colostomy in place [facility])   Primary cancer of right upper lobe of lung ([place])   Diabetes mellitus without complication ([place])   Hypertension   CAP (community acquired pneumonia)   SOB (shortness of breath)   Hypokalemia   Depression   Acute respiratory failure with hypoxia (H[facility])   Malnutrition of moderate degree   Inadequate oral intake   Chronic anemia   Cecum mass   30 Day Unplanned Readmission Risk Score    Flowsheet Row ED to Hosp-Admission (Current) from 11/29/2021 in [place]  30 Day Unplanned Readmission Risk Score (%) 26.76 Filed at 12/03/2021 0801       This score is the patient's risk of an unplanned readmission within 30 days of being discharged (0 -100%). The score is based on dignosis, age, lab data, medications, orders, and past utilization.   Low:  0-14.9   Medium: 15-21.9   High: 22-29.9   Extreme: 30 and above         Discharge Instructions:  Allergies as of 12/03/2021       Reactions   Paclitaxel Shortness Of Breath   Ambien [zolpidem]    Causes confusion    Nsaids    Abdominal pain   Aleve [naproxen Sodium] Itching        Medication List     STOP taking these medications    megestrol 625 MG/5ML suspension Commonly known as: MEGACE ES       TAKE these medications    acetaminophen 500 MG tablet Commonly known as: TYLENOL Take 2 tablets (1,000 mg total) by mouth daily as needed. Home med. What changed:  when to take this reasons to take this additional instructions   albuterol 108 (90 Base) MCG/ACT inhaler Commonly known as: VENTOLIN HFA Inhale 2 puffs into the lungs every 6 (six) hours as needed for wheezing or shortness of breath.   albuterol 0.63 MG/3ML nebulizer solution Commonly known as: ACCUNEB Take 3 mLs (0.63 mg total) by nebulization every 4 (four) hours as needed. Per pharmacy 0.83% dose  available (this dose not available in Epic).  This dose was called in to [employer] [facility]   amoxicillin-clavulanate 875-125 MG tablet Commonly known as: AUGMENTIN Take 1 tablet by mouth every 12 (twelve) hours for 5 days.   baclofen 10 MG tablet Commonly known as: LIORESAL Take 5 mg by mouth at bedtime as needed for muscle spasms.   blood glucose meter kit and supplies Kit Check your blood glucose levels twice a day; once in the morning before breakfast; and once in the evening after dinner.   cetirizine 10 MG tablet Commonly known as: ZYRTEC Take 10 mg by mouth daily.   dextromethorphan-guaiFENesin 30-600 MG 12hr tablet Commonly known as: MUCINEX DM Take 1 tablet by mouth 2 (two) times daily as needed for cough.   diphenhydramine-acetaminophen 25-500 MG Tabs tablet Commonly known as: TYLENOL PM Take 1-2 tablets by mouth at bedtime as needed (sleep.).   dorzolamide-timolol 22.3-6.8 MG/ML ophthalmic solution Commonly known as: COSOPT Place 1 drop into both eyes 2 (two) times daily.   dronabinol 5 MG capsule Commonly known as: MARINOL Take 1 capsule (5 mg total) by mouth 2 (two) times daily before lunch and supper.   feeding supplement Liqd Take 237 mLs by mouth 3 (  three) times daily between meals.   fluticasone-salmeterol 500-50 MCG/ACT Aepb Commonly known as: Advair Diskus Inhale 1 puff into the lungs in the morning and at bedtime.   freestyle lancets Check your blood glucose levels twice a day; once in the morning before breakfast; and once in the evening after dinner.   glipiZIDE 5 MG tablet Commonly known as: GLUCOTROL Take 1 tablet (5 mg total) by mouth 2 (two) times daily before a meal.   glucose blood test strip Check your blood glucose levels twice a day; once in the morning before breakfast; and once in the evening after dinner.   guaiFENesin-codeine 100-10 MG/5ML syrup Take 5 mLs by mouth daily as needed for cough.   ipratropium 0.02 % nebulizer  solution Commonly known as: ATROVENT Take 2.5 mLs (0.5 mg total) by nebulization 4 (four) times daily.   ipratropium-albuterol 0.5-2.5 (3) MG/3ML Soln Commonly known as: DUONEB Take 3 mLs by nebulization every 4 (four) hours as needed.   latanoprost 0.005 % ophthalmic solution Commonly known as: XALATAN Place 1 drop into both eyes at bedtime.   mirtazapine 7.5 MG tablet Commonly known as: REMERON Take 7.5 mg by mouth at bedtime. For appetite stimulation   montelukast 10 MG tablet Commonly known as: SINGULAIR TAKE 1 TABLET(10 MG) BY MOUTH AT BEDTIME   multivitamin with minerals Tabs tablet Take 1 tablet by mouth daily. Start taking on: December 04, 2021   Nebulizer Devi Use as directed   olmesartan 40 MG tablet Commonly known as: BENICAR Take 40 mg by mouth daily.   predniSONE 5 MG tablet Commonly known as: DELTASONE Take 7 tablets (35 mg) from 3/3 to 3/4, 6 tablets from 3/5 to 3/6, 5 tablets from 3/7 to 3/8, 4 tablets from 3/9 to 3/10, 3 tablets from 3/11 to 3/12, 2 tablets from 3/12 to 3/14, 1 tablet from 3/15 to 3/16, then done.   pregabalin 50 MG capsule Commonly known as: LYRICA TAKE 1 CAPSULE(50 MG) BY MOUTH TWICE DAILY What changed: See the new instructions.   sucralfate 1 g tablet Commonly known as: Carafate Take 1 tablet (1 g total) by mouth 3 (three) times daily. Dissolve in 3-4 tbsp warm water, swish and swallow   tamsulosin 0.4 MG Caps capsule Commonly known as: FLOMAX Take 0.4 mg by mouth daily.   traMADol 50 MG tablet Commonly known as: ULTRAM Take 2 tablets (100 mg total) by mouth every 6 (six) hours as needed.   venlafaxine XR 75 MG 24 hr capsule Commonly known as: EFFEXOR-XR Take 225 mg by mouth daily.   VITAMIN B-12 PO Take by mouth daily.         Follow-up Information     [name], MD Follow up.   Specialties: Internal Medicine, Oncology Contact information: [place] [address] [phone]          Ottie Glazier, MD Follow up in 1 week(s).   Specialty: Pulmonary Disease Contact information: [place] [address] [phone]                 Allergies  Allergen Reactions   Paclitaxel Shortness Of Breath   Ambien [Zolpidem]     Causes confusion    Nsaids     Abdominal pain   Aleve [Naproxen Sodium] Itching     The results of significant diagnostics from this hospitalization (including imaging, microbiology, ancillary and laboratory) are listed below for reference.   Consultations:   Procedures/Studies: DG Chest 1 View  Result Date: 11/29/2021 CLINICAL DATA:  Shortness of breath. Metastatic non-small cell lung cancer. EXAM: CHEST  1 VIEW COMPARISON:  Chest radiograph dated 09/18/2021 and CT dated 09/09/2021 FINDINGS: Left-sided Port-A-Cath in similar position. Large area of consolidation in the right upper lobe/right apex with central necrosis corresponding to the known malignancy. Overall increase in the opacity with decreased aeration of the right upper lobe compared to prior radiograph. Additionally there is diffuse interstitial nodularity primarily involving the right lung which is new compared to prior radiograph suspicious for metastatic disease or lymphangitic carcinomatosis. Atypical pneumonia is less likely but not excluded. No large pleural effusion. No pneumothorax. Stable cardiomediastinal silhouette. No acute osseous pathology. IMPRESSION: Progression of right upper lobe centrally necrotic mass with findings suggestive of pulmonary metastatic disease or lymphangitic carcinomatosis, new since the prior radiograph. Electronically Signed   By: Anner Crete M.D.   On: 11/29/2021 21:38   CT CHEST W CONTRAST  Result Date: 11/30/2021 CLINICAL DATA:  Small cell lung cancer EXAM: CT CHEST WITH CONTRAST TECHNIQUE: Multidetector CT imaging of the chest was performed during intravenous contrast administration. RADIATION DOSE REDUCTION: This  exam was performed according to the departmental dose-optimization program which includes automated exposure control, adjustment of the mA and/or kV according to patient size and/or use of iterative reconstruction technique. CONTRAST:  73m OMNIPAQUE IOHEXOL 300 MG/ML  SOLN COMPARISON:  Chest CT with contrast dated September 09, 2021 FINDINGS: Cardiovascular: Normal heart size. No pericardial effusion. Coronary artery calcifications of the LAD and circumflex. Mediastinum/Nodes: Patulous esophagus. Thyroid is unremarkable. Enlarged mediastinal lymph nodes, unchanged compared to prior exam. Unchanged subcentimeter bilateral hilar lymph nodes. Lungs/Pleura: Centrilobular emphysema. 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 Increased right upper lobe collapse. Increased centrilobular nodules of the bilateral lower lungs with associated interlobular septal thickening and more focal areas of consolidation. Increased consolidation and ground-glass opacities of the superior segment of the right lower lobe and paramediastinal left upper lobe, likely post radiation change. Upper Abdomen: Unchanged bilateral adrenal gland nodules, likely benign adenomas given stability on multiple prior exams. Musculoskeletal: Destructive changes of the right lateral ribs adjacent to the right upper lobe mass, unchanged compared to prior exam. No new destructive osseous lesions. IMPRESSION: 1. 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 Mass is overall similar in size but demonstrates decreased fluid component and increased air component. 2. Increased centrilobular nodules and areas of consolidation which are most prominent in the lower lungs and likely due to aspiration. 3. Unchanged enlarged mediastinal lymph nodes. 4. Increased  consolidation and ground-glass opacities of the superior segment of the right lower lobe and paramediastinal left upper lobe, likely evolving postradiation change. 5. Coronary artery calcifications, aortic Atherosclerosis (ICD10-I70.0) and Emphysema (ICD10-J43.9). Electronically Signed   By: LYetta GlassmanM.D.   On: 11/30/2021 14:22   DG Chest Port 1 View  Result Date: 12/03/2021 CLINICAL DATA:  Pneumonia.  Small cell lung cancer. EXAM: PORTABLE CHEST 1 VIEW COMPARISON:  11/29/2021, CT 11/30/2021 FINDINGS: Large cavitary mass lesion right apex unchanged. Small nodular densities throughout the lung bases bilaterally unchanged. Possible pneumonia or tumor spread No layering pleural effusion.  Port-A-Cath tip SVC. IMPRESSION: No significant interval change Cavitary mass right upper lobe. Nodular infiltrates in both lung bases. Electronically Signed   By: CFranchot GalloM.D.   On: 12/03/2021 11:17  Labs: BNP (last 3 results) No results for input(s): BNP in the last 8760 hours. Basic Metabolic Panel: Recent Labs  Lab 11/29/21 2113 11/29/21 2257 11/30/21 0511 12/01/21 0504 12/02/21 0515 12/03/21 0451  NA 135  --  136 141 140 139  K 3.1*  --  3.6 3.7 3.5 4.3  CL 99  --  102 107 104 106  CO2 26  --  _0 GLUCOSE 228*  --  165* 129* 139* 202*  BUN 7*  --  7* _1 CREATININE 0.71  --  0.76 0.70 0.68 0.67  CALCIUM 8.5*  --  8.1* 8.6* 8.6* 8.8*  MG  --  2.0 2.0  --  2.0 2.0  PHOS  --   --  3.4  --  3.7  --    Liver Function Tests: Recent Labs  Lab 11/29/21 2113 11/30/21 0511  AST 15 15  ALT 15 13  ALKPHOS 99 88  BILITOT 0.3 0.2*  PROT 6.4* 6.1*  ALBUMIN 2.3* 2.2*   No results for input(s): LIPASE, AMYLASE in the last 168 hours. No results for input(s): AMMONIA in the last 168 hours. CBC: Recent Labs  Lab 11/29/21 2113 11/30/21 0511 12/01/21 0504 12/02/21 0515 12/03/21 0451  WBC 15.9* 12.5* 11.2*   11.9* 10.6* 10.1  NEUTROABS 14.0* 10.7* 9.6*  --   --    HGB 7.7* 7.3* 7.3*   7.5* 7.6* 9.4*  HCT 24.5* 23.8* 24.1*   23.7* 25.4* 29.5*  MCV 89.4 89.5 89.3   89 88.8 88.6  PLT 226 198 211   243 217 246   Cardiac Enzymes: No results for input(s): CKTOTAL, CKMB, CKMBINDEX, TROPONINI in the last 168 hours. BNP: Invalid input(s): POCBNP CBG: Recent Labs  Lab 12/02/21 1126 12/02/21 1626 12/03/21 0033 12/03/21 0730 12/03/21 1133  GLUCAP 132* 136* 235* 167* 135*   D-Dimer No results for input(s): DDIMER in the last 72 hours. Hgb A1c No results for input(s): HGBA1C in the last 72 hours. Lipid Profile No results for input(s): CHOL, HDL, LDLCALC, TRIG, CHOLHDL, LDLDIRECT in the last 72 hours. Thyroid function studies No results for input(s): TSH, T4TOTAL, T3FREE, THYROIDAB in the last 72 hours.  Invalid input(s): FREET3 Anemia work up No results for input(s): VITAMINB12, FOLATE, FERRITIN, TIBC, IRON, RETICCTPCT in the last 72 hours. Urinalysis    Component Value Date/Time   COLORURINE YELLOW (A) 11/30/2021 0511   APPEARANCEUR CLEAR (A) 11/30/2021 0511   APPEARANCEUR Cloudy 01/08/2012 1314   LABSPEC 1.004 (L) 11/30/2021 0511   LABSPEC 1.030 01/08/2012 1314   PHURINE 7.0 11/30/2021 0511   GLUCOSEU 50 (A) 11/30/2021 0511   GLUCOSEU 150 mg/dL 01/08/2012 1314   HGBUR SMALL (A) 11/30/2021 0511   BILIRUBINUR NEGATIVE 11/30/2021 0511   BILIRUBINUR Negative 01/08/2012 1314   KETONESUR NEGATIVE 11/30/2021 0511   PROTEINUR NEGATIVE 11/30/2021 0511   NITRITE NEGATIVE 11/30/2021 0511   LEUKOCYTESUR NEGATIVE 11/30/2021 0511   LEUKOCYTESUR Trace 01/08/2012 1314   Sepsis Labs Invalid input(s): PROCALCITONIN,  WBC,  LACTICIDVEN Microbiology Recent Results (from the past 240 hour(s))  Urine Culture     Status: None   Collection Time: 11/29/21  5:11 AM   Specimen: Urine, Clean Catch  Result Value Ref Range Status   Specimen Description   Final    URINE, CLEAN CATCH Performed at [facility], [address], [place], [place]  [address]    Special Requests   Final    NONE Performed at [facility], [address] [place], [place] [address]    Culture   Final    NO GROWTH Performed at [facility], [place] [address] [place], [place] [address]    Report Status 12/01/2021 FINAL  Final  Resp Panel by RT-PCR (Flu A&B, Covid) Nasopharyngeal Swab     Status: None   Collection Time: 11/29/21  9:13 PM   Specimen: Nasopharyngeal Swab; Nasopharyngeal(NP) swabs in vial transport medium  Result Value Ref Range Status   SARS Coronavirus 2 by RT PCR NEGATIVE NEGATIVE Final    Comment: (NOTE) SARS-CoV-2 target nucleic acids are NOT DETECTED.  The SARS-CoV-2 RNA is generally detectable in upper respiratory specimens during the acute phase of infection. The lowest concentration of SARS-CoV-2 viral copies this assay can detect is 138 copies/mL. A negative result does not preclude SARS-Cov-2 infection and should not be used as the sole basis for treatment or other patient management decisions. A negative result may occur with  improper specimen collection/handling, submission of specimen other than nasopharyngeal swab, presence of viral mutation(s) within the areas targeted by this assay, and inadequate number of viral copies(<138 copies/mL). A negative result must be combined with clinical observations, patient history, and epidemiological information. The expected result is Negative.  Fact Sheet for Patients:  EntrepreneurPulse.com.au  Fact Sheet for Healthcare Providers:  IncredibleEmployment.be  This test is no t yet approved or cleared by the [place] FDA and  has been authorized for detection and/or diagnosis of SARS-CoV-2 by FDA under an Emergency Use Authorization (EUA). This EUA will remain  in effect (meaning this test can be used) for the duration of the COVID-19 declaration under Section 564(b)(1) of the Act, 21 U.S.C.section 360bbb-3(b)(1), unless the  authorization is terminated  or revoked sooner.       Influenza A by PCR NEGATIVE NEGATIVE Final   Influenza B by PCR NEGATIVE NEGATIVE Final    Comment: (NOTE) The Xpert Xpress SARS-CoV-2/FLU/RSV plus assay is intended as an aid in the diagnosis of influenza from Nasopharyngeal swab specimens and should not be used as a sole basis for treatment. Nasal washings and aspirates are unacceptable for Xpert Xpress SARS-CoV-2/FLU/RSV testing.  Fact Sheet for Patients: EntrepreneurPulse.com.au  Fact Sheet for Healthcare Providers: IncredibleEmployment.be  This test is not yet approved or cleared by the [place] FDA and has been authorized for detection and/or diagnosis of SARS-CoV-2 by FDA under an Emergency Use Authorization (EUA). This EUA will remain in effect (meaning this test can be used) for the duration of the COVID-19 declaration under Section 564(b)(1) of the Act, 21 U.S.C. section 360bbb-3(b)(1), unless the authorization is terminated or revoked.  Performed at [facility], [address] [place], [place] [address]   Blood Culture (routine x 2)     Status: None (Preliminary result)   Collection Time: 11/29/21  9:21 PM   Specimen: BLOOD  Result Value Ref Range Status   Specimen Description BLOOD LEFT FOREARM  Final   Special Requests   Final    BOTTLES DRAWN AEROBIC AND ANAEROBIC Blood Culture results may not be optimal due to an excessive volume of blood received in culture bottles   Culture   Final    NO GROWTH 4 DAYS Performed at [facility], [address], [place], [place] [address]    Report Status PENDING  Incomplete  Blood Culture (routine x 2)     Status: None (Preliminary result)   Collection Time: 11/29/21  9:21 PM   Specimen:  BLOOD  Result Value Ref Range Status   Specimen Description BLOOD RIGHT FOREARM  Final   Special Requests   Final    BOTTLES DRAWN AEROBIC AND ANAEROBIC Blood Culture  results may not be optimal due to an inadequate volume of blood received in culture bottles   Culture   Final    NO GROWTH 4 DAYS Performed at [facility], [address], [place], [place] [address]    Report Status PENDING  Incomplete  Expectorated Sputum Assessment w Gram Stain, Rflx to Resp Cult     Status: None   Collection Time: 11/30/21  7:26 PM   Specimen: Sputum  Result Value Ref Range Status   Specimen Description SPUTUM  Final   Special Requests NONE  Final   Sputum evaluation   Final    THIS SPECIMEN IS ACCEPTABLE FOR SPUTUM CULTURE Performed at [facility], [address], [place], [place] [address]    Report Status 11/30/2021 FINAL  Final  Culture, Respiratory w Gram Stain     Status: None   Collection Time: 11/30/21  7:26 PM   Specimen: SPU  Result Value Ref Range Status   Specimen Description   Final    SPUTUM Performed at [facility], [address], [place], [place] [address]    Special Requests   Final    NONE Reflexed from [id number] Performed at [facility], [address] [place], [place] [address]    Gram Stain   Final    NO SQUAMOUS EPITHELIAL CELLS SEEN FEW WBC SEEN FEW GRAM POSITIVE COCCI    Culture   Final    FEW Normal respiratory flora-no Staph aureus or Pseudomonas seen Performed at [facility], [place] [address], [place], [place] [address]    Report Status 12/03/2021 FINAL  Final  MRSA Next Gen by PCR, Nasal     Status: None   Collection Time: 12/02/21  7:00 AM   Specimen: Nasal Mucosa; Nasal Swab  Result Value Ref Range Status   MRSA by PCR Next Gen NOT DETECTED NOT DETECTED Final    Comment: (NOTE) The GeneXpert MRSA Assay (FDA approved for NASAL specimens only), is one component of a comprehensive MRSA colonization surveillance program. It is not intended to diagnose MRSA infection nor to guide or monitor treatment for MRSA infections. Test performance is not FDA approved in patients less  than 42 years old. Performed at [facility], [address] [place], [place] [address]      Total time spend on discharging this patient, including the last patient exam, discussing the hospital stay, instructions for ongoing care as it relates to all pertinent caregivers, as well as preparing the medical discharge records, prescriptions, and/or referrals as applicable, is 45 minutes.    [name], MD  [facility] 12/03/2021, 12:20 PM

## 2021-12-04 LAB — CULTURE, BLOOD (ROUTINE X 2)
Culture: NO GROWTH
Culture: NO GROWTH

## 2021-12-04 LAB — FUNGITELL, SERUM: Fungitell Result: <31 pg/mL (ref <80)

## 2021-12-04 LAB — ASPERGILLUS ANTIGEN, BAL/SERUM: Aspergillus Ag, BAL/Serum: 0.04 Index (ref 0.00–0.49)

## 2021-12-07 ENCOUNTER — Inpatient Hospital Stay (HOSPITAL_BASED_OUTPATIENT_CLINIC_OR_DEPARTMENT_OTHER): Payer: Medicare PPO | Admitting: Internal Medicine

## 2021-12-07 ENCOUNTER — Other Ambulatory Visit: Payer: Self-pay

## 2021-12-07 ENCOUNTER — Inpatient Hospital Stay: Payer: Medicare PPO

## 2021-12-07 ENCOUNTER — Encounter: Payer: Self-pay | Admitting: Internal Medicine

## 2021-12-07 VITALS — BP 145/84 | HR 95 | Temp 98.4°F | Resp 19 | Wt 177.1 lb

## 2021-12-07 DIAGNOSIS — G893 Neoplasm related pain (acute) (chronic): Secondary | ICD-10-CM | POA: Diagnosis not present

## 2021-12-07 DIAGNOSIS — F4320 Adjustment disorder, unspecified: Secondary | ICD-10-CM | POA: Diagnosis not present

## 2021-12-07 DIAGNOSIS — K5900 Constipation, unspecified: Secondary | ICD-10-CM | POA: Diagnosis not present

## 2021-12-07 DIAGNOSIS — C3411 Malignant neoplasm of upper lobe, right bronchus or lung: Secondary | ICD-10-CM

## 2021-12-07 DIAGNOSIS — I2510 Atherosclerotic heart disease of native coronary artery without angina pectoris: Secondary | ICD-10-CM | POA: Diagnosis not present

## 2021-12-07 DIAGNOSIS — Z87442 Personal history of urinary calculi: Secondary | ICD-10-CM | POA: Diagnosis not present

## 2021-12-07 DIAGNOSIS — E119 Type 2 diabetes mellitus without complications: Secondary | ICD-10-CM | POA: Diagnosis not present

## 2021-12-07 DIAGNOSIS — E1165 Type 2 diabetes mellitus with hyperglycemia: Secondary | ICD-10-CM | POA: Diagnosis not present

## 2021-12-07 DIAGNOSIS — Z7984 Long term (current) use of oral hypoglycemic drugs: Secondary | ICD-10-CM | POA: Diagnosis not present

## 2021-12-07 DIAGNOSIS — I700 Atherosclerosis of aorta: Secondary | ICD-10-CM | POA: Diagnosis not present

## 2021-12-07 DIAGNOSIS — Z9221 Personal history of antineoplastic chemotherapy: Secondary | ICD-10-CM | POA: Diagnosis not present

## 2021-12-07 DIAGNOSIS — A419 Sepsis, unspecified organism: Secondary | ICD-10-CM | POA: Diagnosis not present

## 2021-12-07 DIAGNOSIS — R5383 Other fatigue: Secondary | ICD-10-CM | POA: Diagnosis not present

## 2021-12-07 DIAGNOSIS — D509 Iron deficiency anemia, unspecified: Secondary | ICD-10-CM | POA: Diagnosis not present

## 2021-12-07 DIAGNOSIS — Z8042 Family history of malignant neoplasm of prostate: Secondary | ICD-10-CM | POA: Diagnosis not present

## 2021-12-07 DIAGNOSIS — I10 Essential (primary) hypertension: Secondary | ICD-10-CM | POA: Diagnosis not present

## 2021-12-07 DIAGNOSIS — R1900 Intra-abdominal and pelvic swelling, mass and lump, unspecified site: Secondary | ICD-10-CM | POA: Diagnosis not present

## 2021-12-07 DIAGNOSIS — Z923 Personal history of irradiation: Secondary | ICD-10-CM | POA: Diagnosis not present

## 2021-12-07 DIAGNOSIS — F17210 Nicotine dependence, cigarettes, uncomplicated: Secondary | ICD-10-CM | POA: Diagnosis not present

## 2021-12-07 DIAGNOSIS — E46 Unspecified protein-calorie malnutrition: Secondary | ICD-10-CM | POA: Diagnosis not present

## 2021-12-07 DIAGNOSIS — E876 Hypokalemia: Secondary | ICD-10-CM | POA: Diagnosis not present

## 2021-12-07 DIAGNOSIS — J432 Centrilobular emphysema: Secondary | ICD-10-CM | POA: Diagnosis not present

## 2021-12-07 DIAGNOSIS — R42 Dizziness and giddiness: Secondary | ICD-10-CM | POA: Diagnosis not present

## 2021-12-07 DIAGNOSIS — Z79899 Other long term (current) drug therapy: Secondary | ICD-10-CM | POA: Diagnosis not present

## 2021-12-07 LAB — CBC WITH DIFFERENTIAL/PLATELET
Abs Immature Granulocytes: 0.09 10*3/uL — ABNORMAL HIGH (ref 0.00–0.07)
Basophils Absolute: 0 10*3/uL (ref 0.0–0.1)
Basophils Relative: 0 %
Eosinophils Absolute: 0.1 10*3/uL (ref 0.0–0.5)
Eosinophils Relative: 1 %
HCT: 32.9 % — ABNORMAL LOW (ref 39.0–52.0)
Hemoglobin: 9.9 g/dL — ABNORMAL LOW (ref 13.0–17.0)
Immature Granulocytes: 1 %
Lymphocytes Relative: 5 %
Lymphs Abs: 0.6 10*3/uL — ABNORMAL LOW (ref 0.7–4.0)
MCH: 27.7 pg (ref 26.0–34.0)
MCHC: 30.1 g/dL (ref 30.0–36.0)
MCV: 91.9 fL (ref 80.0–100.0)
Monocytes Absolute: 0.5 10*3/uL (ref 0.1–1.0)
Monocytes Relative: 4 %
Neutro Abs: 11.2 10*3/uL — ABNORMAL HIGH (ref 1.7–7.7)
Neutrophils Relative %: 89 %
Platelets: 210 10*3/uL (ref 150–400)
RBC: 3.58 MIL/uL — ABNORMAL LOW (ref 4.22–5.81)
RDW: 17.1 % — ABNORMAL HIGH (ref 11.5–15.5)
WBC: 12.5 10*3/uL — ABNORMAL HIGH (ref 4.0–10.5)
nRBC: 0 % (ref 0.0–0.2)

## 2021-12-07 LAB — COMPREHENSIVE METABOLIC PANEL
ALT: 64 U/L — ABNORMAL HIGH (ref 0–44)
AST: 31 U/L (ref 15–41)
Albumin: 2.8 g/dL — ABNORMAL LOW (ref 3.5–5.0)
Alkaline Phosphatase: 86 U/L (ref 38–126)
Anion gap: 8 (ref 5–15)
BUN: 17 mg/dL (ref 8–23)
CO2: 24 mmol/L (ref 22–32)
Calcium: 8.4 mg/dL — ABNORMAL LOW (ref 8.9–10.3)
Chloride: 101 mmol/L (ref 98–111)
Creatinine, Ser: 0.81 mg/dL (ref 0.61–1.24)
GFR, Estimated: >60 mL/min (ref >60)
Glucose, Bld: 295 mg/dL — ABNORMAL HIGH (ref 70–99)
Potassium: 3.5 mmol/L (ref 3.5–5.1)
Sodium: 133 mmol/L — ABNORMAL LOW (ref 135–145)
Total Bilirubin: 0.2 mg/dL — ABNORMAL LOW (ref 0.3–1.2)
Total Protein: 7 g/dL (ref 6.5–8.1)

## 2021-12-07 LAB — TSH: TSH: 4.649 u[IU]/mL — ABNORMAL HIGH (ref 0.350–4.500)

## 2021-12-07 MED ORDER — SODIUM CHLORIDE 0.9 % IV SOLN
10.0000 mg | Freq: Once | INTRAVENOUS | Status: AC
  Administered 2021-12-07: 10 mg via INTRAVENOUS
  Filled 2021-12-07: qty 10

## 2021-12-07 MED ORDER — FENTANYL 25 MCG/HR TD PT72: 1.0000 | MEDICATED_PATCH | TRANSDERMAL | 0 refills | Status: DC

## 2021-12-07 MED ORDER — HEPARIN SOD (PORK) LOCK FLUSH 100 UNIT/ML IV SOLN
500.0000 [IU] | Freq: Once | INTRAVENOUS | Status: AC
  Administered 2021-12-07: 500 [IU]
  Filled 2021-12-07: qty 5

## 2021-12-07 MED ORDER — LEVALBUTEROL HCL 0.63 MG/3ML IN NEBU: 0.6300 mg | INHALATION_SOLUTION | Freq: Once | RESPIRATORY_TRACT | 12 refills | Status: DC

## 2021-12-07 MED ORDER — SODIUM CHLORIDE 0.9 % IV SOLN
Freq: Once | INTRAVENOUS | Status: AC
  Filled 2021-12-07: qty 250

## 2021-12-07 MED ORDER — OXYCODONE HCL 5 MG PO TABS: 5.0000 mg | ORAL_TABLET | Freq: Four times a day (QID) | ORAL | 0 refills | Status: DC | PRN

## 2021-12-07 NOTE — Assessment & Plan Note (Addendum)
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 Mass is overall similar in size but demonstrates decreased fluid component and increased air component;  Increased centrilobular nodules and areas of consolidation which ?are most prominent in the lower lungs and likely due to aspiration. ? ?#I had a long discussion with the patient and wife regarding concern for progressive disease/lymphangitic spread based on imaging however difficult to differentiate from underlying pneumonia/radiation/treatment effects.  This was also discussed with tumor conference.  ? ?#Right chest wall pain-worsened/pleuritic-on Tylenol/tramadol [Qhs-2;1 in AM - 3 a day- worse; recommend fenatny patch 25 mcg.  Also recommend oxycodone 5 mg every 6 hours as needed.  Patient very concerned about potential addiction.  Discussed regarding importance of hydration to avoid constipation. ? ?#Anemia hemoglobin 8-9- ?  Check iron studies given history of colon mass.  Possible Venofer. ? ?# COPD/fatigue- [coughing/phlegm/ no fevers]- on albuterol/advair [smoking]- TSH- WNL; discontinue albuterol nebulizer given tremors.  Recommend Xopenex/ipratropium nebs. ? 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 Bary Castilla.  Discussed with Dr. Bary Castilla; Hold surgery for now- see below.  ? ?#Poorly controlled diabetes-blood sugars 295.  Continue glipizide; defer to PCP regarding metformin. ? ?# IV mediport: functioning/STABLE ? ? #ongoing fatigue likely secondary to progressive malignancy/COPD/recent pneumonia etc-as per family wishes recommend physical therapy.  Continue IV fluids/dexamethasone on a weekly basis. ? ?# PROGNOSIS: I again had a long discussion with the  patient wife and his daughter over the phone-re: concerns for progressive disease/poor tolerance to therapy.  Discussed regarding hospice if patient continues to decline.  However patient family not ready for hospice yet.  ? ?*appt thru mychart  ? ?# DISPOSITION: ?# HOLD Imfinzi today;  ?# IVFs over 1 hours; dex  ?# in 1 week- NP; labs- cbc/bmp;IVFs over 1 lit; dex   ?# follow up in 2 weeks-MD; -- labs- cbc/cmp; possible IMFINZI-Dr.B ? ?# 40 minutes face-to-face with the patient discussing the above plan of care; more than 50% of time spent on prognosis/ natural history; counseling and coordination. ? ? ? ? ?

## 2021-12-07 NOTE — Progress Notes (Signed)
Patient states right chest area hurts (radiation spot) and his right shoulder. Patient states he is having pain when he cough in right side chest wall. Patient wife states he is overall really weak.  ?

## 2021-12-07 NOTE — Progress Notes (Signed)
[facility] NOTE  Patient Care Team: [name], MD as PCP - General (Family Medicine) [name], RN as Oncology Nurse Navigator [name], MD as Consulting Physician (Urology) [name], MD as Consulting Physician (Oncology)  CHIEF COMPLAINTS/PURPOSE OF CONSULTATION: lung cancer    Oncology History Overview Note  AUG 2022- 8.2 x 5.9 cm spiculated mass noted in right upper lobe consistent with malignancy. It extends from the right hilum to the lateral chest wall and results in lytic destruction of the lateral portion of the right fourth rib.  Mediastinal lymph nodes are noted, including 12 mm right hilar lymph node, consistent with metastatic disease. 3 cm right adrenal mass is noted concerning for metastatic disease.  # AUG 2022- PET IMPRESSION: Peripherally hypermetabolic right upper lobe mass, likely due to central necrosis. Mass extends into the right hilum and right lateral chest wall involving the second through fourth right ribs.   Mildly enlarged and hypermetabolic right hilar lymph node.   No evidence of metastatic disease in the abdomen or pelvis.  # AUG, 2022-RIGHT CHEST WALL Bx-necrosis; suspicious for malignancy not definitive [discussed with Dr.Kraynie;MDT] LUNG MASS, RIGHT; BIOPSY:  - SUSPICIOUS FOR MALIGNANCY.  - SEE COMMENT.   Comment:  Approximately six tissue cores demonstrate inflamed fibrous capsule with  hemosiderin-laden macrophages, in a background of abundant necrosis.  One of the cores demonstrates a minute fragment of viable epithelial  cells.  These cells are positive for p40.  They are negative for TTF1  and CK7. The cells of interest disappear on deeper cut levels.  While  the findings are suspicious for squamous cell carcinoma, there is not  enough viable tumor for a definitive diagnosis.  Additional tissue  sampling may be helpful.   # SEP 2022-cycle #2 Taxol hypersensitive reaction.  Switched  over to #3 cycle- carbo-Abraxane starting 06/29/2021  # DEC 2022- s/p ID- Dr.ravishankar- ? Lung abscess-no antibiotics-  JAN 2023- CT scan incidental- cecal mass- colonoscopy[Dr.Russo; [facility]- high grade dysplasia]  S/p evaluation with Dr. [name] surgery for now]  # S/p evaluation with Dr. [name].   Primary cancer of right upper lobe of lung ([place])  06/03/2021 Initial Diagnosis   Primary cancer of right upper lobe of lung ([place])   06/03/2021 Cancer Staging   Staging form: Lung, AJCC 8th Edition - Clinical: Stage IIIB (cT3, cN2, cM0) - Signed by [name], MD on 06/03/2021    06/15/2021 - 08/03/2021 Chemotherapy   Patient is on Treatment Plan : LUNG Carboplatin / Paclitaxel + XRT q7d     10/02/2021 -  Chemotherapy   Patient is on Treatment Plan : LUNG Durvalumab q14d        HISTORY OF PRESENTING ILLNESS: Patient is ambulating today.  Accompanied by wife.  Dary Dilauro 66 y.o.  male history of smoking -"lung cancer" [Limited necrotic tissue]-stage III unresectable -status post chemoradiation.  Adjuvant  adjuvant IMFINZI is on hold given poor tolerance.  Patient was recently admitted to the hospital for respiratory failure-COPD exacerbation/pneumonia/lung cancer.    Patient complains his right chest wall pain is significantly worse.  This is not well controlled on tramadol/Tylenol.  Complains of jitteriness post nebulizer.  Review of Systems  Constitutional:  Positive for malaise/fatigue and weight loss. Negative for chills, diaphoresis and fever.  HENT:  Negative for nosebleeds and sore throat.   Eyes:  Negative for double vision.  Respiratory:  Positive for cough, sputum production and shortness of breath. Negative for hemoptysis and  wheezing.   Cardiovascular:  Positive for chest pain. Negative for palpitations, orthopnea and leg swelling.  Gastrointestinal:  Positive for nausea. Negative for abdominal pain, blood in stool, constipation, diarrhea, heartburn,  melena and vomiting.  Genitourinary:  Negative for dysuria, frequency and urgency.  Musculoskeletal:  Negative for back pain and joint pain.  Skin: Negative.  Negative for itching and rash.  Neurological:  Positive for tingling. Negative for dizziness, focal weakness, weakness and headaches.  Endo/Heme/Allergies:  Does not bruise/bleed easily.  Psychiatric/Behavioral:  Negative for depression. The patient is not nervous/anxious and does not have insomnia.     MEDICAL HISTORY:  Past Medical History:  Diagnosis Date   Cancer ([employer])    Degenerative arthritis of knee    Depression    Diabetes mellitus without complication ([place])    Diverticulitis    Glaucoma    since 2004   Hernia 1979   History of kidney stones    Hypertension    Personal history of tobacco use, presenting hazards to health    Pre-diabetes    Screening for obesity    Special screening for malignant neoplasms, colon    Wears dentures    full upper and lower    SURGICAL HISTORY: Past Surgical History:  Procedure Laterality Date   [place], 2012   lumbar bulging disc   CATARACT EXTRACTION W/PHACO Left 01/20/2021   Procedure: CATARACT EXTRACTION PHACO AND INTRAOCULAR LENS PLACEMENT ([place]) LEFT;  Surgeon: Birder Robson, MD;  Location: [facility];  Service: Ophthalmology;  Laterality: Left;  10.13 0:52.1   COLONOSCOPY  [id number]   [facility]/Dr. Buelah Manis sessile polyp,49m, found in rectum,multiple diverticula were found in the sigmoid, in descending and in transverse colon   COLONOSCOPY WITH PROPOFOL N/A 10/22/2021   Procedure: COLONOSCOPY WITH PROPOFOL;  Surgeon: RAnnamaria Helling DO;  [facility];  Service: Gastroenterology;  Laterality: N/A;  DM   COLOSTOMY  04-20-13   HERNIA REPAIR  1979   inguinal   IR IMAGING GUIDED PORT INSERTION  06/10/2021   JOINT REPLACEMENT Right    knee   KNEE ARTHROPLASTY Right 05/08/2018   Procedure: COMPUTER ASSISTED TOTAL KNEE ARTHROPLASTY;  Surgeon:  HDereck Leep MD;  Location: [facility];  Service: Orthopedics;  Laterality: Right;   KNEE ARTHROPLASTY Left 11/16/2019   Procedure: COMPUTER ASSISTED TOTAL KNEE ARTHROPLASTY;  Surgeon: HDereck Leep MD;  Location: [facility];  Service: Orthopedics;  Laterality: Left;   LAPAROTOMY  04-20-13   colon resection with colosotmy   LITHOTRIPSY  2013   TONSILLECTOMY AND ADENOIDECTOMY  1962    SOCIAL HISTORY: Social History   Socioeconomic History   Marital status: Married    Spouse name: Not on file   Number of children: Not on file   Years of education: Not on file   Highest education level: Not on file  Occupational History   Not on file  Tobacco Use   Smoking status: Every Day    Packs/day: 1.00    Years: 30.00    Pack years: 30.00    Types: Cigarettes   Smokeless tobacco: Never   Tobacco comments:    since age [id number] Vaping Use   Vaping Use: Never used  Substance and Sexual Activity   Alcohol use: Yes    Alcohol/week: 2.0 standard drinks    Types: 2 Standard drinks or equivalent per week    Comment: 1-2 drinks per week   Drug use: Yes    Types: Marijuana  Sexual activity: Not on file  Other Topics Concern   Not on file  Social History Narrative   [employer] [place]; smoking; not much alcohol. 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  MEDICATIONS:  Current Outpatient Medications  Medication Sig Dispense Refill   acetaminophen  (TYLENOL) 500 MG tablet Take 2 tablets (1,000 mg total) by mouth daily as needed. Home med. 30 tablet 0   albuterol (ACCUNEB) 0.63 MG/3ML nebulizer solution Take 3 mLs (0.63 mg total) by nebulization every 4 (four) hours as needed. Per pharmacy 0.83% dose available (this dose not available in Epic).  This dose was called in to [employer] 360 mL 3   albuterol (VENTOLIN HFA) 108 (90 Base) MCG/ACT inhaler Inhale 2 puffs into the lungs every 6 (six) hours as needed for wheezing or shortness of breath. 1 each 2   amoxicillin-clavulanate (AUGMENTIN) 875-125 MG tablet Take 1 tablet by mouth every 12 (twelve) hours for 5 days. 10 tablet 0   baclofen (LIORESAL) 10 MG tablet Take 5 mg by mouth at bedtime as needed for muscle spasms.     blood glucose meter kit and supplies KIT Check your blood glucose levels twice a day; once in the morning before breakfast; and once in the evening after dinner. 1 each 0   cetirizine (ZYRTEC) 10 MG tablet Take 10 mg by mouth daily.     Cyanocobalamin (VITAMIN B-12 PO) Take by mouth daily.     dextromethorphan-guaiFENesin (MUCINEX DM) 30-600 MG 12hr tablet Take 1 tablet by mouth 2 (two) times daily as needed for cough.     diphenhydramine-acetaminophen (TYLENOL PM) 25-500 MG TABS Take 1-2 tablets by mouth at bedtime as needed (sleep.).      dorzolamide-timolol (COSOPT) 22.3-6.8 MG/ML ophthalmic solution Place 1 drop into both eyes 2 (two) times daily.     dronabinol (MARINOL) 5 MG capsule Take 1 capsule (5 mg total) by mouth 2 (two) times daily before lunch and supper. 60 capsule 0   feeding supplement (ENSURE ENLIVE / ENSURE PLUS) LIQD Take 237 mLs by mouth 3 (three) times daily between meals.     fentaNYL (DURAGESIC) 25 MCG/HR Place 1 patch onto the skin every 3 (three) days. 5 patch 0   fluticasone-salmeterol (ADVAIR DISKUS) 500-50 MCG/ACT AEPB Inhale 1 puff into the lungs in the morning and at bedtime. 1 each 3   glipiZIDE (GLUCOTROL) 5 MG tablet Take 1 tablet (5 mg  total) by mouth 2 (two) times daily before a meal. 60 tablet 3   glucose blood test strip Check your blood glucose levels twice a day; once in the morning before breakfast; and once in the evening after dinner. 100 each 12   guaiFENesin-codeine 100-10 MG/5ML syrup Take 5 mLs by mouth daily as needed for cough. 120 mL 0   ipratropium (ATROVENT) 0.02 % nebulizer solution Take 2.5 mLs (0.5 mg total) by nebulization 4 (  four) times daily. 360 mL 3   ipratropium-albuterol (DUONEB) 0.5-2.5 (3) MG/3ML SOLN Take 3 mLs by nebulization every 4 (four) hours as needed. 360 mL 3   Lancets (FREESTYLE) lancets Check your blood glucose levels twice a day; once in the morning before breakfast; and once in the evening after dinner. 100 each 12   latanoprost (XALATAN) 0.005 % ophthalmic solution Place 1 drop into both eyes at bedtime.     levalbuterol (XOPENEX) 0.63 MG/3ML nebulizer solution Take 3 mLs (0.63 mg total) by nebulization once for 1 dose. 3 mL 12   mirtazapine (REMERON) 7.5 MG tablet Take 7.5 mg by mouth at bedtime. For appetite stimulation     montelukast (SINGULAIR) 10 MG tablet TAKE 1 TABLET(10 MG) BY MOUTH AT BEDTIME 90 tablet 0   Multiple Vitamin (MULTIVITAMIN WITH MINERALS) TABS tablet Take 1 tablet by mouth daily.     olmesartan (BENICAR) 40 MG tablet Take 40 mg by mouth daily.     oxyCODONE (OXY IR/ROXICODONE) 5 MG immediate release tablet Take 1 tablet (5 mg total) by mouth every 6 (six) hours as needed for severe pain. 45 tablet 0   predniSONE (DELTASONE) 5 MG tablet Take 7 tablets (35 mg) from 3/3 to 3/4, 6 tablets from 3/5 to 3/6, 5 tablets from 3/7 to 3/8, 4 tablets from 3/9 to 3/10, 3 tablets from 3/11 to 3/12, 2 tablets from 3/12 to 3/14, 1 tablet from 3/15 to 3/16, then done. 56 tablet 0   pregabalin (LYRICA) 50 MG capsule TAKE 1 CAPSULE(50 MG) BY MOUTH TWICE DAILY 60 capsule 1   Respiratory Therapy Supplies (NEBULIZER) DEVI Use as directed 1 each 0   sucralfate (CARAFATE) 1 g tablet Take 1  tablet (1 g total) by mouth 3 (three) times daily. Dissolve in 3-4 tbsp warm water, swish and swallow 90 tablet 1   tamsulosin (FLOMAX) 0.4 MG CAPS capsule Take 0.4 mg by mouth daily.     traMADol (ULTRAM) 50 MG tablet Take 2 tablets (100 mg total) by mouth every 6 (six) hours as needed. 90 tablet 0   venlafaxine XR (EFFEXOR-XR) 75 MG 24 hr capsule Take 225 mg by mouth daily.     No current facility-administered medications for this visit.   Facility-Administered Medications Ordered in Other Visits  Medication Dose Route Frequency Provider Last Rate Last Admin   heparin lock flush 100 UNIT/ML injection            heparin lock flush 100 UNIT/ML injection            heparin lock flush 100 UNIT/ML injection               .  PHYSICAL EXAMINATION: ECOG PERFORMANCE STATUS: 1 - Symptomatic but completely ambulatory  Vitals:   12/07/21 0918  BP: (!) 145/84  Pulse: 95  Resp: 19  Temp: 98.4 F (36.9 C)  SpO2: 98%    Filed Weights   12/07/21 0918  Weight: 177 lb 1.6 oz (80.3 kg)     Physical Exam Vitals and nursing note reviewed.  HENT:     Head: Normocephalic and atraumatic.     Mouth/Throat:     Pharynx: Oropharynx is clear.  Eyes:     Extraocular Movements: Extraocular movements intact.     Pupils: Pupils are equal, round, and reactive to light.  Cardiovascular:     Rate and Rhythm: Normal rate and regular rhythm.  Pulmonary:     Comments: Decreased breath sounds bilaterally.  Abdominal:  Palpations: Abdomen is soft.  Musculoskeletal:        General: Normal range of motion.     Cervical back: Normal range of motion.  Skin:    General: Skin is warm.  Neurological:     General: No focal deficit present.     Mental Status: He is alert and oriented to person, place, and time.  Psychiatric:        Behavior: Behavior normal.        Judgment: Judgment normal.     LABORATORY DATA:  I have reviewed the data as listed Lab Results  Component Value Date   WBC 12.5  (H) 12/07/2021   HGB 9.9 (L) 12/07/2021   HCT 32.9 (L) 12/07/2021   MCV 91.9 12/07/2021   PLT 210 12/07/2021   Recent Labs    11/29/21 2113 11/30/21 0511 12/01/21 0504 12/02/21 0515 12/03/21 0451 12/07/21 0857  NA 135 136   < > 140 139 133*  K 3.1* 3.6   < > 3.5 4.3 3.5  CL 99 102   < > 104 106 101  CO2 26 23   < > _0 GLUCOSE 228* 165*   < > 139* 202* 295*  BUN 7* 7*   < > _1 CREATININE 0.71 0.76   < > 0.68 0.67 0.81  CALCIUM 8.5* 8.1*   < > 8.6* 8.8* 8.4*  GFRNONAA >60 >60   < > >60 >60 >60  PROT 6.4* 6.1*  --   --   --  7.0  ALBUMIN 2.3* 2.2*  --   --   --  2.8*  AST 15 15  --   --   --  31  ALT 15 13  --   --   --  64*  ALKPHOS 99 88  --   --   --  86  BILITOT 0.3 0.2*  --   --   --  0.2*   < > = values in this interval not displayed.    RADIOGRAPHIC STUDIES: I have personally reviewed the radiological images as listed and agreed with the findings in the report. DG Chest 1 View  Result Date: 11/29/2021 CLINICAL DATA:  Shortness of breath. Metastatic non-small cell lung cancer. EXAM: CHEST  1 VIEW COMPARISON:  Chest radiograph dated 09/18/2021 and CT dated 09/09/2021 FINDINGS: Left-sided Port-A-Cath in similar position. Large area of consolidation in the right upper lobe/right apex with central necrosis corresponding to the known malignancy. Overall increase in the opacity with decreased aeration of the right upper lobe compared to prior radiograph. Additionally there is diffuse interstitial nodularity primarily involving the right lung which is new compared to prior radiograph suspicious for metastatic disease or lymphangitic carcinomatosis. Atypical pneumonia is less likely but not excluded. No large pleural effusion. No pneumothorax. Stable cardiomediastinal silhouette. No acute osseous pathology. IMPRESSION: Progression of right upper lobe centrally necrotic mass with findings suggestive of pulmonary metastatic disease or lymphangitic carcinomatosis, new since  the prior radiograph. Electronically Signed   By: Anner Crete M.D.   On: 11/29/2021 21:38   CT CHEST W CONTRAST  Result Date: 11/30/2021 CLINICAL DATA:  Small cell lung cancer EXAM: CT CHEST WITH CONTRAST TECHNIQUE: Multidetector CT imaging of the chest was performed during intravenous contrast administration. RADIATION DOSE REDUCTION: This exam was performed according to the departmental dose-optimization program which includes automated exposure control, adjustment of the mA and/or kV according to patient size and/or use of iterative reconstruction technique. CONTRAST:  110m OMNIPAQUE IOHEXOL 300 MG/ML  SOLN COMPARISON:  Chest CT with contrast dated September 09, 2021 FINDINGS: Cardiovascular: Normal heart size. No pericardial effusion. Coronary artery calcifications of the LAD and circumflex. Mediastinum/Nodes: Patulous esophagus. Thyroid is unremarkable. Enlarged mediastinal lymph nodes, unchanged compared to prior exam. Unchanged subcentimeter bilateral hilar lymph nodes. Lungs/Pleura: Centrilobular emphysema. 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 Increased right upper lobe collapse. Increased centrilobular nodules of the bilateral lower lungs with associated interlobular septal thickening and more focal areas of consolidation. Increased consolidation and ground-glass opacities of the superior segment of the right lower lobe and paramediastinal left upper lobe, likely post radiation change. Upper Abdomen: Unchanged bilateral adrenal gland nodules, likely benign adenomas given stability on multiple prior exams. Musculoskeletal: Destructive changes of the right lateral ribs adjacent to the right upper lobe mass, unchanged compared to prior exam. No new destructive osseous lesions. IMPRESSION: 1. Necrotic mass of the right  upper lobe with associated bronchopleural fistula. Mass is overall similar in size but demonstrates decreased fluid component and increased air component. 2. Increased centrilobular nodules and areas of consolidation which are most prominent in the lower lungs and likely due to aspiration. 3. Unchanged enlarged mediastinal lymph nodes. 4. Increased consolidation and ground-glass opacities of the superior segment of the right lower lobe and paramediastinal left upper lobe, likely evolving postradiation change. 5. Coronary artery calcifications, aortic Atherosclerosis (ICD10-I70.0) and Emphysema (ICD10-J43.9). Electronically Signed   By: LYetta GlassmanM.D.   On: 11/30/2021 14:22   DG Chest Port 1 View  Result Date: 12/03/2021 CLINICAL DATA:  Pneumonia.  Small cell lung cancer. EXAM: PORTABLE CHEST 1 VIEW COMPARISON:  11/29/2021, CT 11/30/2021 FINDINGS: Large cavitary mass lesion right apex unchanged. Small nodular densities throughout the lung bases bilaterally unchanged. Possible pneumonia or tumor spread No layering pleural effusion.  Port-A-Cath tip SVC. IMPRESSION: No significant interval change Cavitary mass right upper lobe. Nodular infiltrates in both lung bases. Electronically Signed   By: CFranchot GalloM.D.   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 Mass is overall similar in size but demonstrates decreased fluid component and increased air component;  Increased centrilobular nodules and areas of consolidation which are most prominent in the lower lungs and likely due to aspiration.  #I had a long discussion with the patient and wife regarding concern for progressive  disease/lymphangitic spread based on imaging however difficult to differentiate from underlying pneumonia/radiation/treatment effects.  This was also discussed with tumor conference.   #Right chest wall pain-worsened/pleuritic-on Tylenol/tramadol [Qhs-2;1 in AM - 3 a day- worse; recommend fenatny patch 25 mcg.  Also recommend oxycodone 5 mg every 6 hours as needed.  Patient very concerned about potential addiction.  Discussed regarding importance of hydration to avoid constipation.  #Anemia hemoglobin 8-9- ?  Check iron studies given history of colon mass.  Possible Venofer.  # COPD/fatigue- [coughing/phlegm/ no fevers]- on albuterol/advair [smoking]- TSH- WNL; discontinue albuterol nebulizer given tremors.  Recommend Xopenex/ipratropium nebs.  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 BBary Castilla  Discussed with Dr. Bary Castilla; Hold surgery for now- see below.   #Poorly controlled diabetes-blood sugars 295.  Continue glipizide; defer to PCP regarding metformin.  # IV mediport: functioning/STABLE   #ongoing fatigue likely secondary to progressive malignancy/COPD/recent pneumonia etc-as per family wishes recommend physical therapy.  Continue IV fluids/dexamethasone on a weekly basis.  # PROGNOSIS: I again had a long discussion with the patient wife and his daughter over the phone-re: concerns for progressive disease/poor tolerance to therapy.  Discussed regarding hospice if patient continues to decline.  However patient family not ready for hospice yet.   *appt thru mychart   # DISPOSITION: # HOLD Imfinzi today;  # IVFs over 1 hours; dex  # in 1 week- NP; labs- cbc/bmp;IVFs over 1 lit; dex   # follow up in 2 weeks-MD; -- labs- cbc/cmp; possible IMFINZI-Dr.B  # 40 minutes face-to-face with the patient discussing the above plan of care; more than 50% of time spent on prognosis/ natural history; counseling and  coordination.      All questions were answered. The patient knows to call the clinic with any problems, questions or concerns.    [name], MD 12/07/2021 5:01 PM

## 2021-12-08 ENCOUNTER — Inpatient Hospital Stay: Payer: Medicare PPO

## 2021-12-08 ENCOUNTER — Other Ambulatory Visit: Payer: Self-pay | Admitting: *Deleted

## 2021-12-08 NOTE — Progress Notes (Signed)
Nutrition Follow-up: ? ?Patient with lung cancer, imfinzi currently on hold.  New diagnosis of colon cancer, hold surgery for now.  Recent hospital admission for respiratory failure/COPD exacerbation/pneumonia/lung cancer.   ? ?Spoke with patient via phone.  Patient reports that appetite may be a little bit better.  Can't remember what he ate yesterday.  Has not eaten anything yet this am.  Reports that food still has an off flavor.  Reports that meats taste salty.  Thinks he is drinking the boost VHC.  "I don't know, my wife fixes it for me."   ? ? ?Medications: marinol ? ?Labs: reviewed ? ?Anthropometrics:  ? ?Weight 177 lb 1.6 oz on 3/6 ? ?184 lb on 2/21 ?181 lb 8 oz on 2/15 ?189 lb on 2/3 ?190 lb on 1/16 ?189 lb on 12/20 ?191 lb on 11/17 ? ? ?NUTRITION DIAGNOSIS: Inadequate oral intake continues ? ? ?INTERVENTION:  ?Continue high calorie shakes as tolerated ?Continue appetite stimulant, thinks marinol works better ?Previous discussions on strategies to help with taste change.  ?  ? ?MONITORING, EVALUATION, GOAL: weight trends, intake ? ? ?NEXT VISIT: phone call Tuesday, March 28  ? ?Jef Futch B. Zenia Resides, RD, LDN ?Registered Dietitian ?[phone] (mobile) ? ? ?

## 2021-12-08 NOTE — Telephone Encounter (Signed)
Per pt's spouse, prescriptions sent in yesterday were not received by pharmacy. Requested to resent prescriptions.  ?

## 2021-12-09 ENCOUNTER — Inpatient Hospital Stay: Payer: Medicare PPO

## 2021-12-09 ENCOUNTER — Telehealth: Payer: Self-pay | Admitting: *Deleted

## 2021-12-09 ENCOUNTER — Encounter: Payer: Self-pay | Admitting: Internal Medicine

## 2021-12-09 MED ORDER — FENTANYL 25 MCG/HR TD PT72: 1.0000 | MEDICATED_PATCH | TRANSDERMAL | 0 refills | Status: DC

## 2021-12-09 MED ORDER — LEVALBUTEROL HCL 0.63 MG/3ML IN NEBU: 0.6300 mg | INHALATION_SOLUTION | Freq: Four times a day (QID) | RESPIRATORY_TRACT | 12 refills | Status: DC | PRN

## 2021-12-09 MED ORDER — OXYCODONE HCL 5 MG PO TABS: 5.0000 mg | ORAL_TABLET | Freq: Four times a day (QID) | ORAL | 0 refills | Status: DC | PRN

## 2021-12-09 NOTE — Telephone Encounter (Signed)
Wife called asking if patient can come in tomorrow for IV fluids. Please advise ?

## 2021-12-09 NOTE — Telephone Encounter (Signed)
Call returned to wife to let her know to have patient in office tomorrow at 1PM for IV fluids, I had to leave message on voice mail and asked she return my call to confirm receipt. I also tried the home number and got answering machine but did not leave a second msg ?

## 2021-12-10 ENCOUNTER — Other Ambulatory Visit: Payer: Self-pay | Admitting: Hospice and Palliative Medicine

## 2021-12-10 ENCOUNTER — Other Ambulatory Visit: Payer: Self-pay | Admitting: Internal Medicine

## 2021-12-10 ENCOUNTER — Inpatient Hospital Stay: Payer: Medicare PPO

## 2021-12-10 ENCOUNTER — Other Ambulatory Visit: Payer: Self-pay

## 2021-12-10 VITALS — BP 125/72 | HR 101 | Temp 98.6°F | Resp 18

## 2021-12-10 DIAGNOSIS — R531 Weakness: Secondary | ICD-10-CM

## 2021-12-10 DIAGNOSIS — C3411 Malignant neoplasm of upper lobe, right bronchus or lung: Secondary | ICD-10-CM | POA: Diagnosis not present

## 2021-12-10 DIAGNOSIS — R530 Neoplastic (malignant) related fatigue: Secondary | ICD-10-CM

## 2021-12-10 MED ORDER — DEXAMETHASONE SODIUM PHOSPHATE 10 MG/ML IJ SOLN
10.0000 mg | Freq: Once | INTRAMUSCULAR | Status: AC
  Administered 2021-12-10: 13:00:00 10 mg via INTRAVENOUS

## 2021-12-10 MED ORDER — SODIUM CHLORIDE 0.9 % IV SOLN
Freq: Once | INTRAVENOUS | Status: AC
  Filled 2021-12-10: qty 250

## 2021-12-10 MED ORDER — BUPROPION HCL ER (XL) 150 MG PO TB24: 150.0000 mg | ORAL_TABLET | Freq: Every day | ORAL | 2 refills | Status: DC

## 2021-12-10 MED ORDER — HEPARIN SOD (PORK) LOCK FLUSH 100 UNIT/ML IV SOLN
500.0000 [IU] | Freq: Once | INTRAVENOUS | Status: AC | PRN
  Administered 2021-12-10: 15:00:00 500 [IU]
  Filled 2021-12-10: qty 5

## 2021-12-10 MED ORDER — SODIUM CHLORIDE 0.9% FLUSH
10.0000 mL | Freq: Once | INTRAVENOUS | Status: AC | PRN
  Administered 2021-12-10: 13:00:00 10 mL
  Filled 2021-12-10: qty 10

## 2021-12-10 NOTE — Progress Notes (Signed)
I called and spoke with patient's daughter, Chinita Pester [phone]).  Daughter is a PMHNP and would like to trial Wellbutrin XL 150 mg daily in addition to patient's established dose of Effexor to see if this improves depressive symptoms.  We will send Rx to pharmacy.  She also requested labs: free T3, T4, and testosterone. ?

## 2021-12-10 NOTE — Progress Notes (Signed)
Pt eating solid foods. Foods taste salty to him. Fatigued at times. Denies and N/V or diarrhea or constipation. Drinking boost and ice tea.Requested a liter of IVFs today. Wanting steroid as that seems to make him feel better. Daughter is asking what nebulizer meds pt should be taking; I spoke with Dr B and relayed his thoughts.Daughter is requesting to speak with Billey Chang NP I explained Merrily Pew has a full schedule this afternoon. I will ask [name] to try and see her if not I will give Merrily Pew the daughters phone number. Discharged to home. ?

## 2021-12-10 NOTE — Progress Notes (Signed)
Spoke to Rodney Vega re: ipratropium-sent t pharmacy ?

## 2021-12-11 ENCOUNTER — Other Ambulatory Visit: Payer: Self-pay

## 2021-12-15 ENCOUNTER — Inpatient Hospital Stay: Payer: Medicare PPO

## 2021-12-15 ENCOUNTER — Other Ambulatory Visit: Payer: Self-pay

## 2021-12-15 ENCOUNTER — Inpatient Hospital Stay (HOSPITAL_BASED_OUTPATIENT_CLINIC_OR_DEPARTMENT_OTHER): Payer: Medicare PPO | Admitting: Nurse Practitioner

## 2021-12-15 VITALS — Wt 169.9 lb

## 2021-12-15 VITALS — BP 101/56 | HR 98 | Temp 98.6°F | Resp 16 | Wt 168.0 lb

## 2021-12-15 DIAGNOSIS — D6481 Anemia due to antineoplastic chemotherapy: Secondary | ICD-10-CM | POA: Diagnosis not present

## 2021-12-15 DIAGNOSIS — R530 Neoplastic (malignant) related fatigue: Secondary | ICD-10-CM

## 2021-12-15 DIAGNOSIS — C3411 Malignant neoplasm of upper lobe, right bronchus or lung: Secondary | ICD-10-CM

## 2021-12-15 DIAGNOSIS — G893 Neoplasm related pain (acute) (chronic): Secondary | ICD-10-CM

## 2021-12-15 DIAGNOSIS — R531 Weakness: Secondary | ICD-10-CM

## 2021-12-15 DIAGNOSIS — Z95828 Presence of other vascular implants and grafts: Secondary | ICD-10-CM

## 2021-12-15 DIAGNOSIS — C3490 Malignant neoplasm of unspecified part of unspecified bronchus or lung: Secondary | ICD-10-CM

## 2021-12-15 DIAGNOSIS — T451X5A Adverse effect of antineoplastic and immunosuppressive drugs, initial encounter: Secondary | ICD-10-CM

## 2021-12-15 LAB — CBC WITH DIFFERENTIAL/PLATELET
Abs Immature Granulocytes: 0.09 10*3/uL — ABNORMAL HIGH (ref 0.00–0.07)
Basophils Absolute: 0.1 10*3/uL (ref 0.0–0.1)
Basophils Relative: 0 %
Eosinophils Absolute: 0.1 10*3/uL (ref 0.0–0.5)
Eosinophils Relative: 1 %
HCT: 37 % — ABNORMAL LOW (ref 39.0–52.0)
Hemoglobin: 11.5 g/dL — ABNORMAL LOW (ref 13.0–17.0)
Immature Granulocytes: 1 %
Lymphocytes Relative: 6 %
Lymphs Abs: 0.9 10*3/uL (ref 0.7–4.0)
MCH: 27.8 pg (ref 26.0–34.0)
MCHC: 31.1 g/dL (ref 30.0–36.0)
MCV: 89.4 fL (ref 80.0–100.0)
Monocytes Absolute: 0.7 10*3/uL (ref 0.1–1.0)
Monocytes Relative: 5 %
Neutro Abs: 12.1 10*3/uL — ABNORMAL HIGH (ref 1.7–7.7)
Neutrophils Relative %: 87 %
Platelets: 287 10*3/uL (ref 150–400)
RBC: 4.14 MIL/uL — ABNORMAL LOW (ref 4.22–5.81)
RDW: 16.5 % — ABNORMAL HIGH (ref 11.5–15.5)
WBC: 13.9 10*3/uL — ABNORMAL HIGH (ref 4.0–10.5)
nRBC: 0 % (ref 0.0–0.2)

## 2021-12-15 LAB — COMPREHENSIVE METABOLIC PANEL
ALT: 29 U/L (ref 0–44)
AST: 18 U/L (ref 15–41)
Albumin: 3 g/dL — ABNORMAL LOW (ref 3.5–5.0)
Alkaline Phosphatase: 92 U/L (ref 38–126)
Anion gap: 7 (ref 5–15)
BUN: 17 mg/dL (ref 8–23)
CO2: 24 mmol/L (ref 22–32)
Calcium: 8.6 mg/dL — ABNORMAL LOW (ref 8.9–10.3)
Chloride: 104 mmol/L (ref 98–111)
Creatinine, Ser: 0.83 mg/dL (ref 0.61–1.24)
GFR, Estimated: >60 mL/min (ref >60)
Glucose, Bld: 257 mg/dL — ABNORMAL HIGH (ref 70–99)
Potassium: 3.3 mmol/L — ABNORMAL LOW (ref 3.5–5.1)
Sodium: 135 mmol/L (ref 135–145)
Total Bilirubin: 0.6 mg/dL (ref 0.3–1.2)
Total Protein: 7 g/dL (ref 6.5–8.1)

## 2021-12-15 MED ORDER — HEPARIN SOD (PORK) LOCK FLUSH 100 UNIT/ML IV SOLN
500.0000 [IU] | Freq: Once | INTRAVENOUS | Status: AC
  Administered 2021-12-15: 500 [IU] via INTRAVENOUS
  Filled 2021-12-15: qty 5

## 2021-12-15 MED ORDER — SODIUM CHLORIDE 0.9% FLUSH
10.0000 mL | Freq: Once | INTRAVENOUS | Status: AC
  Administered 2021-12-15: 10 mL via INTRAVENOUS
  Filled 2021-12-15: qty 10

## 2021-12-15 MED ORDER — SODIUM CHLORIDE 0.9 % IV SOLN
INTRAVENOUS | Status: AC
  Filled 2021-12-15 (×2): qty 250

## 2021-12-15 MED ORDER — DEXAMETHASONE SODIUM PHOSPHATE 10 MG/ML IJ SOLN
10.0000 mg | Freq: Once | INTRAMUSCULAR | Status: AC
  Administered 2021-12-15: 10 mg via INTRAVENOUS
  Filled 2021-12-15: qty 1

## 2021-12-15 MED ORDER — SODIUM CHLORIDE 0.9 % IV SOLN: 10.0000 mg | Freq: Once | INTRAVENOUS | Status: DC

## 2021-12-15 NOTE — Progress Notes (Signed)
[place] ?CONSULT NOTE ? ?Patient Care Team: ?[name], MD as PCP - General (Family Medicine) ?[name], RN as Sales executive ?[name], MD as Consulting Physician (Urology) ?[name], MD as Consulting Physician (Oncology) ? ?CHIEF COMPLAINTS/PURPOSE OF CONSULTATION: lung cancer ? ?Oncology History Overview Note  ?AUG 2022- 8.2 x 5.9 cm spiculated mass noted in right upper lobe consistent ?with malignancy. It extends from the right hilum to the lateral ?chest wall and results in lytic destruction of the lateral portion ?of the right fourth rib.  Mediastinal lymph nodes are noted, including 12 mm right hilar lymph ?node, consistent with metastatic disease. 3 cm right adrenal mass is noted concerning for metastatic disease. ? ?# AUG 2022- PET IMPRESSION: ?Peripherally hypermetabolic right upper lobe mass, likely due to ?central necrosis. Mass extends into the right hilum and right ?lateral chest wall involving the second through fourth right ribs. ?  ?Mildly enlarged and hypermetabolic right hilar lymph node. ?  ?No evidence of metastatic disease in the abdomen or pelvis. ? ?# AUG, 2022-RIGHT CHEST WALL Bx-necrosis; suspicious for malignancy not definitive [discussed with Dr.Kraynie;MDT] ?LUNG MASS, RIGHT; BIOPSY:  ?- SUSPICIOUS FOR MALIGNANCY.  ?- SEE COMMENT.  ? ?Comment:  ?Approximately six tissue cores demonstrate inflamed fibrous capsule with  ?hemosiderin-laden macrophages, in a background of abundant necrosis.  ?One of the cores demonstrates a minute fragment of viable epithelial  ?cells.  These cells are positive for p40.  They are negative for TTF1  ?and CK7. The cells of interest disappear on deeper cut levels.  While  ?the findings are suspicious for squamous cell carcinoma, there is not  ?enough viable tumor for a definitive diagnosis.  Additional tissue  ?sampling may be helpful.  ? ?# SEP 2022-cycle #2 Taxol hypersensitive reaction.  Switched  over to #3 cycle- carbo-Abraxane starting 06/29/2021 ? ?# DEC 2022- s/p ID- Dr.ravishankar- ? Lung abscess-no antibiotics- ? 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 [name] surgery for now] ? ?# S/p evaluation with Dr. [name]. ?  ?Primary cancer of right upper lobe of lung ([place])  ?06/03/2021 Initial Diagnosis  ? Primary cancer of right upper lobe of lung [facility]) ?  ?06/03/2021 Cancer Staging  ? Staging form: Lung, AJCC 8th Edition ?- Clinical: Stage IIIB (cT3, cN2, cM0) - Signed by [name], MD on 06/03/2021 ?  ?06/15/2021 - 08/03/2021 Chemotherapy  ? Patient is on Treatment Plan : LUNG Carboplatin / Paclitaxel + XRT q7d  ?   ?10/02/2021 -  Chemotherapy  ? Patient is on Treatment Plan : LUNG Durvalumab q14d  ?   ? ? ? ?HISTORY OF PRESENTING ILLNESS: Patient is ambulating today.  Accompanied by wife. ? ?Ordean Fouts 66 y.o.  male history of smoking -"lung cancer" [Limited necrotic tissue]-stage III unresectable -status post chemoradiation.  Adjuvant IMFINZI is on hold given poor tolerance. ? ?Patient reports feeling poorly. Appetite is reduced. Has intermittent dizziness. Weight is down. Patient and wife concerned about weight and appetite. Says he is eating well but losing weight. Complains of constipation which is new.  ? ? ?Review of Systems  ?Constitutional:  Positive for malaise/fatigue and weight loss. Negative for chills, diaphoresis and fever.  ?HENT:  Negative for nosebleeds and sore throat.   ?Eyes:  Negative for double vision.  ?Respiratory:  Positive for cough, sputum production and shortness of breath. Negative for hemoptysis and wheezing.   ?Cardiovascular:  Positive for  chest pain. Negative for palpitations, orthopnea and leg swelling.  ?Gastrointestinal:  Positive for constipation and nausea. Negative for abdominal pain, blood in stool, diarrhea, heartburn, melena and vomiting.  ?Genitourinary:  Negative for  dysuria, frequency and urgency.  ?Musculoskeletal:  Negative for back pain and joint pain.  ?Skin: Negative.  Negative for itching and rash.  ?Neurological:  Positive for tingling. Negative for dizziness, focal weakness, weakness and headaches.  ?Endo/Heme/Allergies:  Does not bruise/bleed easily.  ?Psychiatric/Behavioral:  Negative for depression. The patient is not nervous/anxious and does not have insomnia.    ? ?MEDICAL HISTORY:  ?Past Medical History:  ?Diagnosis Date  ? Cancer [facility])   ? Degenerative arthritis of knee   ? Depression   ? Diabetes mellitus without complication ([place])   ? Diverticulitis   ? Glaucoma   ? since 2004  ? Hernia 1979  ? History of kidney stones   ? Hypertension   ? Personal history of tobacco use, presenting hazards to health   ? Pre-diabetes   ? Screening for obesity   ? Special screening for malignant neoplasms, colon   ? Wears dentures   ? full upper and lower  ? ? ?SURGICAL HISTORY: ?Past Surgical History:  ?Procedure Laterality Date  ? BACK SURGERY  1994, 2012  ? lumbar bulging disc  ? CATARACT EXTRACTION W/PHACO Left 01/20/2021  ? Procedure: CATARACT EXTRACTION PHACO AND INTRAOCULAR LENS PLACEMENT ([place]) LEFT;  Surgeon: Birder Robson, MD;  Location: [place];  Service: Ophthalmology;  Laterality: Left;  10.13 ?0:52.1  ? COLONOSCOPY  [id number]  ? [facility]/Dr. Buelah Manis sessile polyp,55mm, found in rectum,multiple diverticula were found in the sigmoid, in descending and in transverse colon  ? COLONOSCOPY WITH PROPOFOL N/A 10/22/2021  ? Procedure: COLONOSCOPY WITH PROPOFOL;  Surgeon: Annamaria Helling, DO;  Location: [facility];  Service: Gastroenterology;  Laterality: N/A;  DM  ? COLOSTOMY  04-20-13  ? HERNIA REPAIR  1979  ? inguinal  ? IR IMAGING GUIDED PORT INSERTION  06/10/2021  ? JOINT REPLACEMENT Right   ? knee  ? KNEE ARTHROPLASTY Right 05/08/2018  ? Procedure: COMPUTER ASSISTED TOTAL KNEE ARTHROPLASTY;  Surgeon: Dereck Leep, MD;  Location: [facility];  Service:  Orthopedics;  Laterality: Right;  ? KNEE ARTHROPLASTY Left 11/16/2019  ? Procedure: COMPUTER ASSISTED TOTAL KNEE ARTHROPLASTY;  Surgeon: Dereck Leep, MD;  Location: [facility];  Service: Orthopedics;  Laterality: Left;  ? LAPAROTOMY  04-20-13  ? colon resection with colosotmy  ? LITHOTRIPSY  2013  ? [place]  ? ? ?SOCIAL HISTORY: ?Social History  ? ?Socioeconomic History  ? Marital status: Married  ?  Spouse name: Not on file  ? Number of children: Not on file  ? Years of education: Not on file  ? Highest education level: Not on file  ?Occupational History  ? Not on file  ?Tobacco Use  ? Smoking status: Every Day  ?  Packs/day: 1.00  ?  Years: 30.00  ?  Pack years: 30.00  ?  Types: Cigarettes  ? Smokeless tobacco: Never  ? Tobacco comments:  ?  since age 65 or 40  ?Vaping Use  ? Vaping Use: Never used  ?Substance and Sexual Activity  ? Alcohol use: Yes  ?  Alcohol/week: 2.0 standard drinks  ?  Types: 2 Standard drinks or equivalent per week  ?  Comment: 1-2 drinks per week  ? Drug use: Yes  ?  Types: Marijuana  ? Sexual activity: Not on  file  ?Other Topics Concern  ? Not on file  ?Social History Narrative  ? 15 mins [place]; smoking; not much alcohol. Worked for [employer], 2019. .   ? ?Social Determinants of Health  ? ?Financial Resource Strain: Not on file  ?Food Insecurity: Not on file  ?Transportation Needs: Not on file  ?Physical Activity: Not on file  ?Stress: Not on file  ?Social Connections: Not on file  ?Intimate Partner Violence: Not on file  ? ? ?FAMILY HISTORY: ?Family History  ?Problem Relation Age of Onset  ? Diabetes Mother   ? Heart disease Mother   ? Heart attack Father   ? Prostate cancer Father   ? ? ?ALLERGIES:  is allergic to paclitaxel, ambien [zolpidem], nsaids, and aleve [naproxen sodium]. ? ?MEDICATIONS:  ?Current Outpatient Medications  ?Medication Sig Dispense Refill  ? acetaminophen (TYLENOL) 500 MG tablet Take 2 tablets (1,000 mg total)  by mouth daily as needed. Home med. 30 tablet 0  ? albuterol (ACCUNEB) 0.63 MG/3ML nebulizer solution Take 3 mLs (0.63 mg total) by nebulization every 4 (four) hours as needed. Per pharmacy 0.83% dose availabl

## 2021-12-16 ENCOUNTER — Telehealth: Payer: Self-pay | Admitting: *Deleted

## 2021-12-16 LAB — TESTOSTERONE: Testosterone: 113 ng/dL — ABNORMAL LOW (ref 264–916)

## 2021-12-16 LAB — T3, FREE: T3, Free: 2.3 pg/mL (ref 2.0–4.4)

## 2021-12-16 NOTE — Telephone Encounter (Signed)
Wife called asking to speak with D rB regarding patient elevated WBC, fatigue, and continued weight loss. She states that Alease Medina, NP could not give her answers to why he has these problems when seen yesterday. Labs were drawn and his Testosterone level is low at 113, but Free T3 is normal. Please return her call [phone] ?

## 2021-12-17 ENCOUNTER — Encounter: Payer: Self-pay | Admitting: Internal Medicine

## 2021-12-17 ENCOUNTER — Telehealth: Payer: Self-pay | Admitting: *Deleted

## 2021-12-17 NOTE — Telephone Encounter (Signed)
Incoming fax from [facility] requesting refill for Mirtazapine 7.5 mg, I do not see this on his current medicine list, You discontinued it 2/23 ?

## 2021-12-18 ENCOUNTER — Other Ambulatory Visit: Payer: Self-pay

## 2021-12-18 ENCOUNTER — Inpatient Hospital Stay: Payer: Medicare PPO

## 2021-12-18 VITALS — BP 133/64 | HR 98 | Temp 98.4°F | Wt 173.0 lb

## 2021-12-18 DIAGNOSIS — C3411 Malignant neoplasm of upper lobe, right bronchus or lung: Secondary | ICD-10-CM

## 2021-12-18 MED ORDER — HEPARIN SOD (PORK) LOCK FLUSH 100 UNIT/ML IV SOLN
500.0000 [IU] | Freq: Once | INTRAVENOUS | Status: AC | PRN
  Administered 2021-12-18: 500 [IU]
  Filled 2021-12-18: qty 5

## 2021-12-18 MED ORDER — SODIUM CHLORIDE 0.9% FLUSH
10.0000 mL | Freq: Once | INTRAVENOUS | Status: AC | PRN
  Administered 2021-12-18: 10 mL
  Filled 2021-12-18: qty 10

## 2021-12-18 MED ORDER — SODIUM CHLORIDE 0.9 % IV SOLN
Freq: Once | INTRAVENOUS | Status: AC
  Filled 2021-12-18: qty 250

## 2021-12-18 MED FILL — Dexamethasone Sodium Phosphate Inj 100 MG/10ML: INTRAMUSCULAR | Qty: 1 | Status: AC

## 2021-12-18 NOTE — Telephone Encounter (Signed)
Appt has been made with L. Zenia Resides 12/21/2021. ?

## 2021-12-18 NOTE — Progress Notes (Signed)
Pt states appetite is improving. Eats 2 meals per day. Snacks a lot during day. Eats cereal . Receiving 1 L NS over 1 hour. Held off on dexamethasone as appetite is improving, denied nausea and blood sugars run high. They requested a visit with Dr Jacinto Reap or Merrily Pew NP. Unable to accommodate request but Merrily Pew will call wife on telephone this afternoon.Discharged to home at completion. ?

## 2021-12-20 ENCOUNTER — Encounter: Payer: Self-pay | Admitting: Internal Medicine

## 2021-12-20 ENCOUNTER — Other Ambulatory Visit: Payer: Self-pay | Admitting: Internal Medicine

## 2021-12-20 NOTE — Progress Notes (Signed)
I spoke to patience for wife, Vickii Chafe, regarding the recent Work up- mild leukocytosis, mild thyroid, testosterone abnormalities, mild hypokalemia, low albumin- is unfortunately related to patient underlying progressive malignancy. Wife also raised the concerns that patient?s blood pressures were running low in the last visit-which again is secondary to progressive malignancy.  ? ?Patient?s wife is reluctant with any further treatment s for the patient. However, she is not sure if patient is ready to make a decision of stopping treatments. ? ?I discussed regarding hospice if they decide to not proceed with any further treatment/or if patient?s clinical status for the declines that he is not a candidate for any further therapies. Await evaluation on 3/20- with Lauren. ? ?

## 2021-12-21 ENCOUNTER — Encounter: Payer: Self-pay | Admitting: Internal Medicine

## 2021-12-21 ENCOUNTER — Inpatient Hospital Stay: Payer: Medicare PPO

## 2021-12-21 ENCOUNTER — Ambulatory Visit
Admission: RE | Admit: 2021-12-21 | Discharge: 2021-12-21 | Disposition: A | Discharge status: Home / Self Care | Payer: Medicare PPO | Source: Ambulatory Visit | Attending: Nurse Practitioner | Admitting: Nurse Practitioner

## 2021-12-21 ENCOUNTER — Telehealth: Payer: Self-pay | Admitting: *Deleted

## 2021-12-21 ENCOUNTER — Encounter: Payer: Self-pay | Admitting: Nurse Practitioner

## 2021-12-21 ENCOUNTER — Inpatient Hospital Stay (HOSPITAL_BASED_OUTPATIENT_CLINIC_OR_DEPARTMENT_OTHER): Payer: Medicare PPO | Admitting: Nurse Practitioner

## 2021-12-21 ENCOUNTER — Other Ambulatory Visit: Payer: Self-pay

## 2021-12-21 ENCOUNTER — Inpatient Hospital Stay (HOSPITAL_BASED_OUTPATIENT_CLINIC_OR_DEPARTMENT_OTHER): Payer: Medicare PPO | Admitting: Hospice and Palliative Medicine

## 2021-12-21 VITALS — BP 110/70 | HR 90 | Temp 98.2°F | Ht 71.0 in | Wt 174.0 lb

## 2021-12-21 DIAGNOSIS — Z515 Encounter for palliative care: Secondary | ICD-10-CM

## 2021-12-21 DIAGNOSIS — C3411 Malignant neoplasm of upper lobe, right bronchus or lung: Secondary | ICD-10-CM

## 2021-12-21 DIAGNOSIS — C3490 Malignant neoplasm of unspecified part of unspecified bronchus or lung: Secondary | ICD-10-CM

## 2021-12-21 DIAGNOSIS — G893 Neoplasm related pain (acute) (chronic): Secondary | ICD-10-CM | POA: Diagnosis not present

## 2021-12-21 DIAGNOSIS — G4700 Insomnia, unspecified: Secondary | ICD-10-CM | POA: Diagnosis not present

## 2021-12-21 LAB — COMPREHENSIVE METABOLIC PANEL
ALT: 18 U/L (ref 0–44)
AST: 22 U/L (ref 15–41)
Albumin: 3 g/dL — ABNORMAL LOW (ref 3.5–5.0)
Alkaline Phosphatase: 101 U/L (ref 38–126)
Anion gap: 10 (ref 5–15)
BUN: 11 mg/dL (ref 8–23)
CO2: 25 mmol/L (ref 22–32)
Calcium: 8.5 mg/dL — ABNORMAL LOW (ref 8.9–10.3)
Chloride: 101 mmol/L (ref 98–111)
Creatinine, Ser: 0.81 mg/dL (ref 0.61–1.24)
GFR, Estimated: >60 mL/min (ref >60)
Glucose, Bld: 275 mg/dL — ABNORMAL HIGH (ref 70–99)
Potassium: 3.3 mmol/L — ABNORMAL LOW (ref 3.5–5.1)
Sodium: 136 mmol/L (ref 135–145)
Total Bilirubin: 0.4 mg/dL (ref 0.3–1.2)
Total Protein: 7 g/dL (ref 6.5–8.1)

## 2021-12-21 LAB — CBC WITH DIFFERENTIAL/PLATELET
Abs Immature Granulocytes: 0.16 10*3/uL — ABNORMAL HIGH (ref 0.00–0.07)
Basophils Absolute: 0.1 10*3/uL (ref 0.0–0.1)
Basophils Relative: 0 %
Eosinophils Absolute: 0.2 10*3/uL (ref 0.0–0.5)
Eosinophils Relative: 1 %
HCT: 37.4 % — ABNORMAL LOW (ref 39.0–52.0)
Hemoglobin: 11.9 g/dL — ABNORMAL LOW (ref 13.0–17.0)
Immature Granulocytes: 1 %
Lymphocytes Relative: 12 %
Lymphs Abs: 1.5 10*3/uL (ref 0.7–4.0)
MCH: 28.2 pg (ref 26.0–34.0)
MCHC: 31.8 g/dL (ref 30.0–36.0)
MCV: 88.6 fL (ref 80.0–100.0)
Monocytes Absolute: 0.8 10*3/uL (ref 0.1–1.0)
Monocytes Relative: 6 %
Neutro Abs: 10.2 10*3/uL — ABNORMAL HIGH (ref 1.7–7.7)
Neutrophils Relative %: 80 %
Platelets: 240 10*3/uL (ref 150–400)
RBC: 4.22 MIL/uL (ref 4.22–5.81)
RDW: 16.1 % — ABNORMAL HIGH (ref 11.5–15.5)
WBC: 12.9 10*3/uL — ABNORMAL HIGH (ref 4.0–10.5)
nRBC: 0 % (ref 0.0–0.2)

## 2021-12-21 MED ORDER — IOHEXOL 300 MG/ML  SOLN
100.0000 mL | Freq: Once | INTRAMUSCULAR | Status: AC | PRN
  Administered 2021-12-21: 100 mL via INTRAVENOUS

## 2021-12-21 MED ORDER — DEXAMETHASONE SODIUM PHOSPHATE 10 MG/ML IJ SOLN
10.0000 mg | Freq: Once | INTRAMUSCULAR | Status: AC
  Administered 2021-12-21: 10 mg via INTRAVENOUS
  Filled 2021-12-21: qty 1

## 2021-12-21 MED ORDER — ESZOPICLONE 1 MG PO TABS: 1.0000 mg | ORAL_TABLET | Freq: Every evening | ORAL | 0 refills | Status: DC | PRN

## 2021-12-21 MED ORDER — SODIUM CHLORIDE 0.9 % IV SOLN: 10.0000 mg | Freq: Once | INTRAVENOUS | Status: DC

## 2021-12-21 MED ORDER — SODIUM CHLORIDE 0.9% FLUSH
10.0000 mL | Freq: Once | INTRAVENOUS | Status: AC
  Administered 2021-12-21: 10 mL via INTRAVENOUS
  Filled 2021-12-21: qty 10

## 2021-12-21 MED ORDER — SODIUM CHLORIDE 0.9 % IV SOLN
Freq: Once | INTRAVENOUS | Status: AC
  Filled 2021-12-21: qty 250

## 2021-12-21 MED ORDER — HEPARIN SOD (PORK) LOCK FLUSH 100 UNIT/ML IV SOLN
500.0000 [IU] | Freq: Once | INTRAVENOUS | Status: AC
  Administered 2021-12-21: 500 [IU] via INTRAVENOUS
  Filled 2021-12-21: qty 5

## 2021-12-21 NOTE — Progress Notes (Signed)
[name] ?CONSULT NOTE ? ?Patient Care Team: ?[name], MD as PCP - General (Family Medicine) ?[name], RN as Sales executive ?[name], MD as Consulting Physician (Urology) ?[name], MD as Consulting Physician (Oncology) ? ?CHIEF COMPLAINTS/PURPOSE OF CONSULTATION: lung cancer ? ?Oncology History Overview Note  ?AUG 2022- 8.2 x 5.9 cm spiculated mass noted in right upper lobe consistent ?with malignancy. It extends from the right hilum to the lateral ?chest wall and results in lytic destruction of the lateral portion ?of the right fourth rib.  Mediastinal lymph nodes are noted, including 12 mm right hilar lymph ?node, consistent with metastatic disease. 3 cm right adrenal mass is noted concerning for metastatic disease. ? ?# AUG 2022- PET IMPRESSION: ?Peripherally hypermetabolic right upper lobe mass, likely due to ?central necrosis. Mass extends into the right hilum and right ?lateral chest wall involving the second through fourth right ribs. ?  ?Mildly enlarged and hypermetabolic right hilar lymph node. ?  ?No evidence of metastatic disease in the abdomen or pelvis. ? ?# AUG, 2022-RIGHT CHEST WALL Bx-necrosis; suspicious for malignancy not definitive [discussed with Dr.Kraynie;MDT] ?LUNG MASS, RIGHT; BIOPSY:  ?- SUSPICIOUS FOR MALIGNANCY.  ?- SEE COMMENT.  ? ?Comment:  ?Approximately six tissue cores demonstrate inflamed fibrous capsule with  ?hemosiderin-laden macrophages, in a background of abundant necrosis.  ?One of the cores demonstrates a minute fragment of viable epithelial  ?cells.  These cells are positive for p40.  They are negative for TTF1  ?and CK7. The cells of interest disappear on deeper cut levels.  While  ?the findings are suspicious for squamous cell carcinoma, there is not  ?enough viable tumor for a definitive diagnosis.  Additional tissue  ?sampling may be helpful.  ? ?# SEP 2022-cycle #2 Taxol hypersensitive reaction.  Switched  over to #3 cycle- carbo-Abraxane starting 06/29/2021 ? ?# DEC 2022- s/p ID- Dr.ravishankar- ? Lung abscess-no antibiotics- ? 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 [name] surgery for now] ? ?# S/p evaluation with Dr. [name]. ?  ?Primary cancer of right upper lobe of lung ([place])  ?06/03/2021 Initial Diagnosis  ? Primary cancer of right upper lobe of lung [facility]) ?  ?06/03/2021 Cancer Staging  ? Staging form: Lung, AJCC 8th Edition ?- Clinical: Stage IIIB (cT3, cN2, cM0) - Signed by [name], MD on 06/03/2021 ? ?  ?06/15/2021 - 08/03/2021 Chemotherapy  ? Patient is on Treatment Plan : LUNG Carboplatin / Paclitaxel + XRT q7d  ?   ?10/02/2021 -  Chemotherapy  ? Patient is on Treatment Plan : LUNG Durvalumab q14d  ?   ? ? ? ?HISTORY OF PRESENTING ILLNESS: Patient is ambulating today.  Accompanied by wife. ? ?Rodney Vega 66 y.o.  male history of smoking -"lung cancer" [Limited necrotic tissue]-stage III unresectable -status post chemoradiation.  Adjuvant IMFINZI is on hold given poor tolerance. ? ? ? ?Patient was recently admitted to the hospital for respiratory failure-COPD exacerbation/pneumonia/lung cancer.   ? ?Patient complains his right chest wall pain is significantly worse.  This is not well controlled on tramadol/Tylenol.  Complains of jitteriness post nebulizer. ? ?Review of Systems  ?Constitutional:  Positive for malaise/fatigue and weight loss. Negative for chills, diaphoresis and fever.  ?HENT:  Negative for nosebleeds and sore throat.   ?Eyes:  Negative for double vision.  ?Respiratory:  Positive for cough, sputum production and shortness of breath. Negative for hemoptysis and wheezing.   ?  Cardiovascular:  Positive for chest pain. Negative for palpitations, orthopnea and leg swelling.  ?Gastrointestinal:  Positive for nausea. Negative for abdominal pain, blood in stool, constipation, diarrhea, heartburn, melena and  vomiting.  ?Genitourinary:  Negative for dysuria, frequency and urgency.  ?Musculoskeletal:  Negative for back pain and joint pain.  ?Skin: Negative.  Negative for itching and rash.  ?Neurological:  Positive for tingling. Negative for dizziness, focal weakness, weakness and headaches.  ?Endo/Heme/Allergies:  Does not bruise/bleed easily.  ?Psychiatric/Behavioral:  Negative for depression. The patient is not nervous/anxious and does not have insomnia.    ? ?MEDICAL HISTORY:  ?Past Medical History:  ?Diagnosis Date  ? Cancer [facility])   ? Degenerative arthritis of knee   ? Depression   ? Diabetes mellitus without complication ([place])   ? Diverticulitis   ? Glaucoma   ? since 2004  ? Hernia 1979  ? History of kidney stones   ? Hypertension   ? Personal history of tobacco use, presenting hazards to health   ? Pre-diabetes   ? Screening for obesity   ? Special screening for malignant neoplasms, colon   ? Wears dentures   ? full upper and lower  ? ? ?SURGICAL HISTORY: ?Past Surgical History:  ?Procedure Laterality Date  ? BACK SURGERY  1994, 2012  ? lumbar bulging disc  ? CATARACT EXTRACTION W/PHACO Left 01/20/2021  ? Procedure: CATARACT EXTRACTION PHACO AND INTRAOCULAR LENS PLACEMENT ([place]) LEFT;  Surgeon: Birder Robson, MD;  Location: [place];  Service: Ophthalmology;  Laterality: Left;  10.13 ?0:52.1  ? COLONOSCOPY  [id number]  ? [facility]/Dr. Buelah Manis sessile polyp,26mm, found in rectum,multiple diverticula were found in the sigmoid, in descending and in transverse colon  ? COLONOSCOPY WITH PROPOFOL N/A 10/22/2021  ? Procedure: COLONOSCOPY WITH PROPOFOL;  Surgeon: Annamaria Helling, DO;  Location[facility];  Service: Gastroenterology;  Laterality: N/A;  DM  ? COLOSTOMY  04-20-13  ? HERNIA REPAIR  1979  ? inguinal  ? IR IMAGING GUIDED PORT INSERTION  06/10/2021  ? JOINT REPLACEMENT Right   ? knee  ? KNEE ARTHROPLASTY Right 05/08/2018  ? Procedure: COMPUTER ASSISTED TOTAL KNEE ARTHROPLASTY;  Surgeon: Dereck Leep, MD;  Location: [facility];  Service: Orthopedics;  Laterality: Right;  ? KNEE ARTHROPLASTY Left 11/16/2019  ? Procedure: COMPUTER ASSISTED TOTAL KNEE ARTHROPLASTY;  Surgeon: Dereck Leep, MD;  Location: [facility];  Service: Orthopedics;  Laterality: Left;  ? LAPAROTOMY  04-20-13  ? colon resection with colosotmy  ? LITHOTRIPSY  2013  ? [place]  ? ? ?SOCIAL HISTORY: ?Social History  ? ?Socioeconomic History  ? Marital status: Married  ?  Spouse name: Not on file  ? Number of children: Not on file  ? Years of education: Not on file  ? Highest education level: Not on file  ?Occupational History  ? Not on file  ?Tobacco Use  ? Smoking status: Every Day  ?  Packs/day: 1.00  ?  Years: 30.00  ?  Pack years: 30.00  ?  Types: Cigarettes  ? Smokeless tobacco: Never  ? Tobacco comments:  ?  since age 28 or 65  ?Vaping Use  ? Vaping Use: Never used  ?Substance and Sexual Activity  ? Alcohol use: Yes  ?  Alcohol/week: 2.0 standard drinks  ?  Types: 2 Standard drinks or equivalent per week  ?  Comment: 1-2 drinks per week  ? Drug use: Yes  ?  Types: Marijuana  ? Sexual  activity: Not on file  ?Other Topics Concern  ? Not on file  ?Social History Narrative  ? 15 mins [place]; smoking; not much alcohol. Worked for [employer], 2019. .   ? ?Social Determinants of Health  ? ?Financial Resource Strain: Not on file  ?Food Insecurity: Not on file  ?Transportation Needs: Not on file  ?Physical Activity: Not on file  ?Stress: Not on file  ?Social Connections: Not on file  ?Intimate Partner Violence: Not on file  ? ? ?FAMILY HISTORY: ?Family History  ?Problem Relation Age of Onset  ? Diabetes Mother   ? Heart disease Mother   ? Heart attack Father   ? Prostate cancer Father   ? ? ?ALLERGIES:  is allergic to paclitaxel, ambien [zolpidem], nsaids, and aleve [naproxen sodium]. ? ?MEDICATIONS:  ?Current Outpatient Medications  ?Medication Sig Dispense Refill  ? acetaminophen (TYLENOL) 500 MG  tablet Take 2 tablets (1,000 mg total) by mouth daily as needed. Home med. 30 tablet 0  ? albuterol (ACCUNEB) 0.63 MG/3ML nebulizer solution Take 3 mLs (0.63 mg total) by nebulization every 4 (four) hours

## 2021-12-21 NOTE — Progress Notes (Signed)
Patient having CT scan after treatment.  Left port accessed.  Patient to return for port deaccess. ?

## 2021-12-21 NOTE — Progress Notes (Signed)
? ?  ?Palliative Medicine ?[place] at [facility] ?[phone] [phone] ? ? ?Name: Rodney Vega ?Date: 12/21/2021 ?MRN: [id number]  ?DOB: 04-30-1956 ? ?Patient Care Team: ?[name], MD as PCP - General (Family Medicine) ?[name], RN as Sales executive ?[name], MD as Consulting Physician (Urology) ?[name], MD as Consulting Physician (Oncology)  ? ? ?REASON FOR CONSULTATION: ?Rodney Vega is a 66 y.o. male with multiple medical problems including stage III right upper lobe lung status post carbo Abraxane and radiation.  History of bronchopleural fistula.  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  Palliative care was consulted help address goals and manage ongoing symptoms. ? ?SOCIAL HISTORY:    ? reports that he has been smoking cigarettes. He has a 30.00 pack-year smoking history. He has never used smokeless tobacco. He reports current alcohol use of about 2.0 standard drinks per week. He reports current drug use. Drug: Marijuana. ? ?Patient is married and lives at home with his wife.  He has a daughter who is a Designer, jewellery in [place]. ? ?ADVANCE DIRECTIVES:  ?On file ? ?CODE STATUS: DNR/DNI (DNR order signed on 12/21/2021) ? ?PAST MEDICAL HISTORY: ?Past Medical History:  ?Diagnosis Date  ? Cancer [facility])   ? Degenerative arthritis of knee   ? Depression   ? Diabetes mellitus without complication ([place])   ? Diverticulitis   ? Glaucoma   ? since 2004  ? Hernia 1979  ? History of kidney stones   ? Hypertension   ? Personal history of tobacco use, presenting hazards to health   ? Pre-diabetes   ? Screening for obesity   ? Special screening for malignant neoplasms, colon   ? Wears dentures   ? full upper and lower  ? ? ?PAST SURGICAL HISTORY:  ?Past Surgical History:  ?Procedure Laterality Date  ? BACK SURGERY  1994, 2012  ? lumbar bulging disc  ? CATARACT EXTRACTION W/PHACO Left 01/20/2021  ? Procedure:  CATARACT EXTRACTION PHACO AND INTRAOCULAR LENS PLACEMENT ([place]) LEFT;  Surgeon: Birder Robson, MD;  Location: [place];  Service: Ophthalmology;  Laterality: Left;  10.13 ?0:52.1  ? COLONOSCOPY  [id number]  ? [facility]/Dr. Buelah Manis sessile polyp,55m, found in rectum,multiple diverticula were found in the sigmoid, in descending and in transverse colon  ? COLONOSCOPY WITH PROPOFOL N/A 10/22/2021  ? Procedure: COLONOSCOPY WITH PROPOFOL;  Surgeon: RAnnamaria Helling DO;  [facility];  Service: Gastroenterology;  Laterality: N/A;  DM  ? COLOSTOMY  04-20-13  ? HERNIA REPAIR  1979  ? inguinal  ? IR IMAGING GUIDED PORT INSERTION  06/10/2021  ? JOINT REPLACEMENT Right   ? knee  ? KNEE ARTHROPLASTY Right 05/08/2018  ? Procedure: COMPUTER ASSISTED TOTAL KNEE ARTHROPLASTY;  Surgeon: HDereck Leep MD;  Location: [facility];  Service: Orthopedics;  Laterality: Right;  ? KNEE ARTHROPLASTY Left 11/16/2019  ? Procedure: COMPUTER ASSISTED TOTAL KNEE ARTHROPLASTY;  Surgeon: HDereck Leep MD;  Location: [facility];  Service: Orthopedics;  Laterality: Left;  ? LAPAROTOMY  04-20-13  ? colon resection with colosotmy  ? LITHOTRIPSY  2013  ? [place] ? ? ?HEMATOLOGY/ONCOLOGY HISTORY:  ?Oncology History Overview Note  ?AUG 2022- 8.2 x 5.9 cm spiculated mass noted in right upper lobe consistent ?with malignancy. It extends from the right hilum to the lateral ?chest wall and results in lytic destruction of the lateral portion ?of the right fourth rib.  Mediastinal lymph  nodes are noted, including 12 mm right hilar lymph ?node, consistent with metastatic disease. 3 cm right adrenal mass is noted concerning for metastatic disease. ? ?# AUG 2022- PET IMPRESSION: ?Peripherally hypermetabolic right upper lobe mass, likely due to ?central necrosis. Mass extends into the right hilum and right ?lateral chest wall involving the second through fourth right ribs. ?  ?Mildly enlarged and hypermetabolic right  hilar lymph node. ?  ?No evidence of metastatic disease in the abdomen or pelvis. ? ?# AUG, 2022-RIGHT CHEST WALL Bx-necrosis; suspicious for malignancy not definitive [discussed with Dr.Kraynie;MDT] ?LUNG MASS, RIGHT; BIOPSY:  ?- SUSPICIOUS FOR MALIGNANCY.  ?- SEE COMMENT.  ? ?Comment:  ?Approximately six tissue cores demonstrate inflamed fibrous capsule with  ?hemosiderin-laden macrophages, in a background of abundant necrosis.  ?One of the cores demonstrates a minute fragment of viable epithelial  ?cells.  These cells are positive for p40.  They are negative for TTF1  ?and CK7. The cells of interest disappear on deeper cut levels.  While  ?the findings are suspicious for squamous cell carcinoma, there is not  ?enough viable tumor for a definitive diagnosis.  Additional tissue  ?sampling may be helpful.  ? ?# SEP 2022-cycle #2 Taxol hypersensitive reaction.  Switched over to #3 cycle- carbo-Abraxane starting 06/29/2021 ? ?# DEC 2022- s/p ID- Dr.ravishankar- ? Lung abscess-no antibiotics- ? 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 [name] surgery for now] ? ?# S/p evaluation with Dr. [name]. ?  ?Primary cancer of right upper lobe of lung ([place])  ?06/03/2021 Initial Diagnosis  ? Primary cancer of right upper lobe of lung [facility]) ?  ?06/03/2021 Cancer Staging  ? Staging form: Lung, AJCC 8th Edition ?- Clinical: Stage IIIB (cT3, cN2, cM0) - Signed by [name], MD on 06/03/2021 ? ?  ?06/15/2021 - 08/03/2021 Chemotherapy  ? Patient is on Treatment Plan : LUNG Carboplatin / Paclitaxel + XRT q7d  ?   ?10/02/2021 -  Chemotherapy  ? Patient is on Treatment Plan : LUNG Durvalumab q14d  ?   ? ? ?ALLERGIES:  is allergic to paclitaxel, ambien [zolpidem], nsaids, and aleve [naproxen sodium]. ? ?MEDICATIONS:  ?Current Outpatient Medications  ?Medication Sig Dispense Refill  ? acetaminophen (TYLENOL) 500 MG tablet Take 2 tablets (1,000 mg  total) by mouth daily as needed. Home med. 30 tablet 0  ? albuterol (ACCUNEB) 0.63 MG/3ML nebulizer solution Take 3 mLs (0.63 mg total) by nebulization every 4 (four) hours as needed. Per pharmacy 0.83% dose available (this dose not available in Epic).  This dose was called in to [employer] 360 mL 3  ? albuterol (VENTOLIN HFA) 108 (90 Base) MCG/ACT inhaler Inhale 2 puffs into the lungs every 6 (six) hours as needed for wheezing or shortness of breath. 1 each 2  ? baclofen (LIORESAL) 10 MG tablet Take 5 mg by mouth at bedtime as needed for muscle spasms.    ? blood glucose meter kit and supplies KIT Check your blood glucose levels twice a day; once in the morning before breakfast; and once in the evening after dinner. 1 each 0  ? buPROPion (WELLBUTRIN XL) 150 MG 24 hr tablet Take 1 tablet (150 mg total) by mouth daily. 30 tablet 2  ? cetirizine (ZYRTEC) 10 MG tablet Take 10 mg by mouth daily.    ? Cyanocobalamin (VITAMIN B-12 PO) Take by mouth daily.    ? dextromethorphan-guaiFENesin (MUCINEX DM) 30-600 MG 12hr tablet Take  1 tablet by mouth 2 (two) times daily as needed for cough.    ? diphenhydramine-acetaminophen (TYLENOL PM) 25-500 MG TABS Take 1-2 tablets by mouth at bedtime as needed (sleep.).     ? dorzolamide-timolol (COSOPT) 22.3-6.8 MG/ML ophthalmic solution Place 1 drop into both eyes 2 (two) times daily.    ? dronabinol (MARINOL) 5 MG capsule Take 1 capsule (5 mg total) by mouth 2 (two) times daily before lunch and supper. 60 capsule 0  ? feeding supplement (ENSURE ENLIVE / ENSURE PLUS) LIQD Take 237 mLs by mouth 3 (three) times daily between meals.    ? fentaNYL (DURAGESIC) 25 MCG/HR Place 1 patch onto the skin every 3 (three) days. 5 patch 0  ? fluticasone-salmeterol (ADVAIR DISKUS) 500-50 MCG/ACT AEPB Inhale 1 puff into the lungs in the morning and at bedtime. 1 each 3  ? glipiZIDE (GLUCOTROL) 5 MG tablet Take 1 tablet (5 mg total) by mouth 2 (two) times daily before a meal. 60 tablet 3  ? glucose  blood test strip Check your blood glucose levels twice a day; once in the morning before breakfast; and once in the evening after dinner. 100 each 12  ? guaiFENesin-codeine 100-10 MG/5ML syrup Take 5 mLs

## 2021-12-21 NOTE — Telephone Encounter (Signed)
Rodney Vega Key: [id number] ?Per Gregery Na, NP- submitted PA for Lunesta.- NO PA needed per covermymeds- "Available without authorization." ?

## 2021-12-21 NOTE — Addendum Note (Signed)
Addended by: Sinclair Grooms D on: 12/21/2021 02:02 PM ? ? Modules accepted: Orders ? ?

## 2021-12-22 ENCOUNTER — Other Ambulatory Visit: Payer: Self-pay | Admitting: *Deleted

## 2021-12-22 MED ORDER — BUPROPION HCL ER (XL) 150 MG PO TB24: 150.0000 mg | ORAL_TABLET | Freq: Every day | ORAL | 2 refills | Status: DC

## 2021-12-22 NOTE — Telephone Encounter (Signed)
Pt's wife stated that they have not received his prescription for wellbutrin 150mg  that was sent into [facility] in [place] on 3/9. Refill has been resent into pharmacy.  ?

## 2021-12-24 ENCOUNTER — Inpatient Hospital Stay: Payer: Medicare PPO

## 2021-12-28 ENCOUNTER — Inpatient Hospital Stay: Payer: Medicare PPO

## 2021-12-28 ENCOUNTER — Other Ambulatory Visit: Payer: Self-pay | Admitting: *Deleted

## 2021-12-28 ENCOUNTER — Other Ambulatory Visit: Payer: Self-pay

## 2021-12-28 VITALS — BP 108/60 | HR 108 | Temp 97.8°F | Wt 178.3 lb

## 2021-12-28 DIAGNOSIS — C3411 Malignant neoplasm of upper lobe, right bronchus or lung: Secondary | ICD-10-CM | POA: Diagnosis not present

## 2021-12-28 MED ORDER — SODIUM CHLORIDE 0.9 % IV SOLN: 10.0000 mg | Freq: Once | INTRAVENOUS | Status: DC

## 2021-12-28 MED ORDER — HEPARIN SOD (PORK) LOCK FLUSH 100 UNIT/ML IV SOLN
500.0000 [IU] | Freq: Once | INTRAVENOUS | Status: AC | PRN
  Administered 2021-12-28: 500 [IU]
  Filled 2021-12-28: qty 5

## 2021-12-28 MED ORDER — SODIUM CHLORIDE 0.9% FLUSH
10.0000 mL | Freq: Once | INTRAVENOUS | Status: AC | PRN
  Administered 2021-12-28: 10 mL
  Filled 2021-12-28: qty 10

## 2021-12-28 MED ORDER — SODIUM CHLORIDE 0.9 % IV SOLN
Freq: Once | INTRAVENOUS | Status: AC
  Filled 2021-12-28: qty 250

## 2021-12-28 MED ORDER — OXYCODONE HCL 5 MG PO TABS: 5.0000 mg | ORAL_TABLET | Freq: Four times a day (QID) | ORAL | 0 refills | Status: DC | PRN

## 2021-12-28 MED ORDER — DEXAMETHASONE SODIUM PHOSPHATE 10 MG/ML IJ SOLN
10.0000 mg | Freq: Once | INTRAMUSCULAR | Status: AC
  Administered 2021-12-28: 10 mg via INTRAVENOUS

## 2021-12-28 NOTE — Progress Notes (Signed)
Pt had 2 different falls at home last week. He has a scrape on his nose and on his knee. Denies any serious injury. Received IV hydration + dexamethasone today. Discharged to home. ?

## 2021-12-29 ENCOUNTER — Inpatient Hospital Stay: Payer: Medicare PPO

## 2021-12-29 ENCOUNTER — Ambulatory Visit: Payer: Medicare PPO

## 2021-12-29 NOTE — Progress Notes (Signed)
Nutrition Follow-up: ? ?Patient with lung cancer, imfinzi currently on hold.  Noted per NP note patient with progression and being sent for restaging CTs.   ? ?Spoke with patient's wife via phone for follow-up.  Wife reports that appetite is a little bit better after starting marinol.  Drinking usually 1 boost VHC shake per day.  Yesterday was able to eat cereal, 1/2 steak and cheese sub with some fries for lunch. Dinner was grilled chicken and rice and beans (small amounts).  Later ate some ice cream.   ? ? ? ?Medications: reviewed ? ?Labs: reviewed ? ?Anthropometrics:  ? ?Weight 178 lb 4.8 oz  ?177 lb on 3/6 ? ?184 lb on 2/21 ?181 lb on 2/15 ?189 lb on 2//3 ?190 lb on 1/16 ? ? ?NUTRITION DIAGNOSIS: Inadequate oral intake slight improvement ? ? ? ?INTERVENTION:  ?Encouraged high calorie, high protein foods frequently. ?Continue appetite stimulant ?Continue boost VHC for added calories and protein ?Encouraged checking blood glucose. Would prefer adjusting medication if blood glucose elevated vs restricting oral intake at this time.   ?  ? ?MONITORING, EVALUATION, GOAL: weight trends, intake ? ? ?NEXT VISIT: Tuesday, April 25 phone call ? ?Carie Kapuscinski B. Zenia Resides, RD, LDN ?Registered Dietitian ?[phone] ? ? ?

## 2021-12-31 ENCOUNTER — Encounter: Payer: Self-pay | Admitting: Nurse Practitioner

## 2021-12-31 ENCOUNTER — Inpatient Hospital Stay: Payer: Medicare PPO

## 2021-12-31 ENCOUNTER — Inpatient Hospital Stay (HOSPITAL_BASED_OUTPATIENT_CLINIC_OR_DEPARTMENT_OTHER): Payer: Medicare PPO | Admitting: Nurse Practitioner

## 2021-12-31 VITALS — BP 131/79 | HR 110 | Temp 98.0°F | Resp 16 | Wt 174.8 lb

## 2021-12-31 DIAGNOSIS — C3411 Malignant neoplasm of upper lobe, right bronchus or lung: Secondary | ICD-10-CM | POA: Diagnosis not present

## 2021-12-31 DIAGNOSIS — Z7189 Other specified counseling: Secondary | ICD-10-CM

## 2021-12-31 LAB — COMPREHENSIVE METABOLIC PANEL
ALT: 16 U/L (ref 0–44)
AST: 27 U/L (ref 15–41)
Albumin: 2.9 g/dL — ABNORMAL LOW (ref 3.5–5.0)
Alkaline Phosphatase: 102 U/L (ref 38–126)
Anion gap: 9 (ref 5–15)
BUN: 8 mg/dL (ref 8–23)
CO2: 26 mmol/L (ref 22–32)
Calcium: 8.3 mg/dL — ABNORMAL LOW (ref 8.9–10.3)
Chloride: 102 mmol/L (ref 98–111)
Creatinine, Ser: 0.91 mg/dL (ref 0.61–1.24)
GFR, Estimated: >60 mL/min (ref >60)
Glucose, Bld: 293 mg/dL — ABNORMAL HIGH (ref 70–99)
Potassium: 3.3 mmol/L — ABNORMAL LOW (ref 3.5–5.1)
Sodium: 137 mmol/L (ref 135–145)
Total Bilirubin: 0.4 mg/dL (ref 0.3–1.2)
Total Protein: 6.5 g/dL (ref 6.5–8.1)

## 2021-12-31 LAB — CBC WITH DIFFERENTIAL/PLATELET
Abs Immature Granulocytes: 0.12 10*3/uL — ABNORMAL HIGH (ref 0.00–0.07)
Basophils Absolute: 0.1 10*3/uL (ref 0.0–0.1)
Basophils Relative: 1 %
Eosinophils Absolute: 0.2 10*3/uL (ref 0.0–0.5)
Eosinophils Relative: 2 %
HCT: 34.8 % — ABNORMAL LOW (ref 39.0–52.0)
Hemoglobin: 11.1 g/dL — ABNORMAL LOW (ref 13.0–17.0)
Immature Granulocytes: 1 %
Lymphocytes Relative: 12 %
Lymphs Abs: 1.2 10*3/uL (ref 0.7–4.0)
MCH: 28.5 pg (ref 26.0–34.0)
MCHC: 31.9 g/dL (ref 30.0–36.0)
MCV: 89.5 fL (ref 80.0–100.0)
Monocytes Absolute: 0.5 10*3/uL (ref 0.1–1.0)
Monocytes Relative: 5 %
Neutro Abs: 7.9 10*3/uL — ABNORMAL HIGH (ref 1.7–7.7)
Neutrophils Relative %: 79 %
Platelets: 194 10*3/uL (ref 150–400)
RBC: 3.89 MIL/uL — ABNORMAL LOW (ref 4.22–5.81)
RDW: 15.9 % — ABNORMAL HIGH (ref 11.5–15.5)
WBC: 10 10*3/uL (ref 4.0–10.5)
nRBC: 0 % (ref 0.0–0.2)

## 2021-12-31 LAB — IRON AND TIBC
Iron: 48 ug/dL (ref 45–182)
Saturation Ratios: 21 % (ref 17.9–39.5)
TIBC: 225 ug/dL — ABNORMAL LOW (ref 250–450)
UIBC: 177 ug/dL

## 2021-12-31 LAB — FERRITIN: Ferritin: 157 ng/mL (ref 24–336)

## 2021-12-31 LAB — VITAMIN B12: Vitamin B-12: 506 pg/mL (ref 180–914)

## 2021-12-31 MED ORDER — HEPARIN SOD (PORK) LOCK FLUSH 100 UNIT/ML IV SOLN
INTRAVENOUS | Status: AC
  Filled 2021-12-31: qty 5

## 2021-12-31 MED ORDER — POTASSIUM CHLORIDE IN NACL 20-0.9 MEQ/L-% IV SOLN
Freq: Once | INTRAVENOUS | Status: AC
  Filled 2021-12-31: qty 1000

## 2021-12-31 MED ORDER — HEPARIN SOD (PORK) LOCK FLUSH 100 UNIT/ML IV SOLN
500.0000 [IU] | Freq: Once | INTRAVENOUS | Status: AC
  Administered 2021-12-31: 500 [IU] via INTRAVENOUS
  Filled 2021-12-31: qty 5

## 2021-12-31 NOTE — Progress Notes (Signed)
[place] ?CONSULT NOTE ? ?Patient Care Team: ?[name], MD as PCP - General (Family Medicine) ?[name], RN as Sales executive ?[name], MD as Consulting Physician (Urology) ?[name], MD as Consulting Physician (Oncology) ? ?CHIEF COMPLAINTS/PURPOSE OF CONSULTATION: lung cancer ? ?Oncology History Overview Note  ?AUG 2022- 8.2 x 5.9 cm spiculated mass noted in right upper lobe consistent ?with malignancy. It extends from the right hilum to the lateral ?chest wall and results in lytic destruction of the lateral portion ?of the right fourth rib.  Mediastinal lymph nodes are noted, including 12 mm right hilar lymph ?node, consistent with metastatic disease. 3 cm right adrenal mass is noted concerning for metastatic disease. ? ?# AUG 2022- PET IMPRESSION: ?Peripherally hypermetabolic right upper lobe mass, likely due to ?central necrosis. Mass extends into the right hilum and right ?lateral chest wall involving the second through fourth right ribs. ?  ?Mildly enlarged and hypermetabolic right hilar lymph node. ?  ?No evidence of metastatic disease in the abdomen or pelvis. ? ?# AUG, 2022-RIGHT CHEST WALL Bx-necrosis; suspicious for malignancy not definitive [discussed with Dr.Kraynie;MDT] ?LUNG MASS, RIGHT; BIOPSY:  ?- SUSPICIOUS FOR MALIGNANCY.  ?- SEE COMMENT.  ? ?Comment:  ?Approximately six tissue cores demonstrate inflamed fibrous capsule with  ?hemosiderin-laden macrophages, in a background of abundant necrosis.  ?One of the cores demonstrates a minute fragment of viable epithelial  ?cells.  These cells are positive for p40.  They are negative for TTF1  ?and CK7. The cells of interest disappear on deeper cut levels.  While  ?the findings are suspicious for squamous cell carcinoma, there is not  ?enough viable tumor for a definitive diagnosis.  Additional tissue  ?sampling may be helpful.  ? ?# SEP 2022-cycle #2 Taxol hypersensitive reaction.  Switched  over to #3 cycle- carbo-Abraxane starting 06/29/2021 ? ?# DEC 2022- s/p ID- Dr.ravishankar- ? Lung abscess-no antibiotics- ? 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 [name] surgery for now] ? ?# S/p evaluation with Dr. [name]. ?  ?Primary cancer of right upper lobe of lung ([place])  ?06/03/2021 Initial Diagnosis  ? Primary cancer of right upper lobe of lung [facility]) ?  ?06/03/2021 Cancer Staging  ? Staging form: Lung, AJCC 8th Edition ?- Clinical: Stage IIIB (cT3, cN2, cM0) - Signed by [name], MD on 06/03/2021 ?  ?06/15/2021 - 08/03/2021 Chemotherapy  ? Patient is on Treatment Plan : LUNG Carboplatin / Paclitaxel + XRT q7d  ?   ?10/02/2021 -  Chemotherapy  ? Patient is on Treatment Plan : LUNG Durvalumab q14d  ?   ? ?HISTORY OF PRESENTING ILLNESS: Patient is ambulating today.  Accompanied by wife. ? ?Rodney Vega 66 y.o.  male history of smoking -"lung cancer" [Limited necrotic tissue]-stage III unresectable -status post chemoradiation.  Adjuvant IMFINZI is on hold given poor tolerance. In interim, he had imaging studies and has received IV fluids. Says he slowly feels better. Has some ongoing cough but not worse. Denies pain. Gets light headed if he changes positions too quickly.  ? ? ?Review of Systems  ?Constitutional:  Positive for malaise/fatigue. Negative for chills, diaphoresis, fever and weight loss.  ?HENT:  Negative for nosebleeds and sore throat.   ?Eyes:  Negative for double vision.  ?Respiratory:  Positive for cough and sputum production. Negative for hemoptysis, shortness of breath and wheezing.   ?Cardiovascular:  Negative for chest pain, palpitations, orthopnea and  leg swelling.  ?Gastrointestinal:  Negative for abdominal pain, blood in stool, constipation, diarrhea, heartburn, melena, nausea and vomiting.  ?Genitourinary:  Negative for dysuria, frequency and urgency.  ?Musculoskeletal:  Negative for back pain  and joint pain.  ?Skin: Negative.  Negative for itching and rash.  ?Neurological:  Positive for tingling. Negative for dizziness, focal weakness, weakness and headaches.  ?Endo/Heme/Allergies:  Does not bruise/bleed easily.  ?Psychiatric/Behavioral:  Negative for depression. The patient is not nervous/anxious and does not have insomnia.    ? ?MEDICAL HISTORY:  ?Past Medical History:  ?Diagnosis Date  ? Cancer [facility])   ? Degenerative arthritis of knee   ? Depression   ? Diabetes mellitus without complication ([place])   ? Diverticulitis   ? Glaucoma   ? since 2004  ? Hernia 1979  ? History of kidney stones   ? Hypertension   ? Personal history of tobacco use, presenting hazards to health   ? Pre-diabetes   ? Screening for obesity   ? Special screening for malignant neoplasms, colon   ? Wears dentures   ? full upper and lower  ? ? ?SURGICAL HISTORY: ?Past Surgical History:  ?Procedure Laterality Date  ? BACK SURGERY  1994, 2012  ? lumbar bulging disc  ? CATARACT EXTRACTION W/PHACO Left 01/20/2021  ? Procedure: CATARACT EXTRACTION PHACO AND INTRAOCULAR LENS PLACEMENT ([place]) LEFT;  Surgeon: Birder Robson, MD;  Location: [place];  Service: Ophthalmology;  Laterality: Left;  10.13 ?0:52.1  ? COLONOSCOPY  [id number]  ? [facility]/Dr. Buelah Manis sessile polyp,27mm, found in rectum,multiple diverticula were found in the sigmoid, in descending and in transverse colon  ? COLONOSCOPY WITH PROPOFOL N/A 10/22/2021  ? Procedure: COLONOSCOPY WITH PROPOFOL;  Surgeon: Annamaria Helling, DO;  [facility];  Service: Gastroenterology;  Laterality: N/A;  DM  ? COLOSTOMY  04-20-13  ? HERNIA REPAIR  1979  ? inguinal  ? IR IMAGING GUIDED PORT INSERTION  06/10/2021  ? JOINT REPLACEMENT Right   ? knee  ? KNEE ARTHROPLASTY Right 05/08/2018  ? Procedure: COMPUTER ASSISTED TOTAL KNEE ARTHROPLASTY;  Surgeon: Dereck Leep, MD;  Location: [facility];  Service: Orthopedics;  Laterality: Right;  ? KNEE ARTHROPLASTY Left 11/16/2019  ?  Procedure: COMPUTER ASSISTED TOTAL KNEE ARTHROPLASTY;  Surgeon: Dereck Leep, MD;  Location: [facility];  Service: Orthopedics;  Laterality: Left;  ? LAPAROTOMY  04-20-13  ? colon resection with colosotmy  ? LITHOTRIPSY  2013  ? [place]  ? ? ?SOCIAL HISTORY: ?Social History  ? ?Socioeconomic History  ? Marital status: Married  ?  Spouse name: Not on file  ? Number of children: Not on file  ? Years of education: Not on file  ? Highest education level: Not on file  ?Occupational History  ? Not on file  ?Tobacco Use  ? Smoking status: Every Day  ?  Packs/day: 1.00  ?  Years: 30.00  ?  Pack years: 30.00  ?  Types: Cigarettes  ? Smokeless tobacco: Never  ? Tobacco comments:  ?  since age 38 or 39  ?Vaping Use  ? Vaping Use: Never used  ?Substance and Sexual Activity  ? Alcohol use: Yes  ?  Alcohol/week: 2.0 standard drinks  ?  Types: 2 Standard drinks or equivalent per week  ?  Comment: 1-2 drinks per week  ? Drug use: Yes  ?  Types: Marijuana  ? Sexual activity: Not on file  ?Other Topics Concern  ? Not on file  ?  Social History Narrative  ? 15 mins [place]; smoking; not much alcohol. Worked for [employer], 2019. .   ? ?Social Determinants of Health  ? ?Financial Resource Strain: Not on file  ?Food Insecurity: Not on file  ?Transportation Needs: Not on file  ?Physical Activity: Not on file  ?Stress: Not on file  ?Social Connections: Not on file  ?Intimate Partner Violence: Not on file  ? ? ?FAMILY HISTORY: ?Family History  ?Problem Relation Age of Onset  ? Diabetes Mother   ? Heart disease Mother   ? Heart attack Father   ? Prostate cancer Father   ? ? ?ALLERGIES:  is allergic to paclitaxel, ambien [zolpidem], nsaids, and aleve [naproxen sodium]. ? ?MEDICATIONS:  ?Current Outpatient Medications  ?Medication Sig Dispense Refill  ? acetaminophen (TYLENOL) 500 MG tablet Take 2 tablets (1,000 mg total) by mouth daily as needed. Home med. 30 tablet 0  ? albuterol (ACCUNEB)  0.63 MG/3ML nebulizer solution Take 3 mLs (0.63 mg total) by nebulization every 4 (four) hours as needed. Per pharmacy 0.83% dose available (this dose not available in Epic).  This dose was called in to [facility]

## 2021-12-31 NOTE — Progress Notes (Signed)
Pt interested in physical therapy due to multiple falls, can you place order? ? ?

## 2021-12-31 NOTE — Progress Notes (Signed)
Patient here for fluid infusion patient has fallen twice since his last visit. He is requestng a home health evaluation for ambulation and stability. Discussed  evaluation appointment with OT here for referral to home health. ?

## 2022-01-05 ENCOUNTER — Ambulatory Visit
Admission: RE | Admit: 2022-01-05 | Discharge: 2022-01-05 | Disposition: A | Discharge status: Home / Self Care | Payer: Medicare PPO | Source: Ambulatory Visit | Attending: Hospice and Palliative Medicine | Admitting: Hospice and Palliative Medicine

## 2022-01-05 ENCOUNTER — Encounter: Payer: Self-pay | Admitting: Internal Medicine

## 2022-01-05 ENCOUNTER — Other Ambulatory Visit: Payer: Self-pay

## 2022-01-05 ENCOUNTER — Ambulatory Visit
Admission: RE | Admit: 2022-01-05 | Discharge: 2022-01-05 | Disposition: A | Discharge status: Home / Self Care | Payer: Medicare PPO | Attending: Hospice and Palliative Medicine | Admitting: Hospice and Palliative Medicine

## 2022-01-05 ENCOUNTER — Inpatient Hospital Stay: Payer: Medicare PPO | Attending: Internal Medicine

## 2022-01-05 ENCOUNTER — Encounter: Payer: Self-pay | Admitting: *Deleted

## 2022-01-05 ENCOUNTER — Inpatient Hospital Stay: Payer: Medicare PPO

## 2022-01-05 ENCOUNTER — Inpatient Hospital Stay (HOSPITAL_BASED_OUTPATIENT_CLINIC_OR_DEPARTMENT_OTHER): Payer: Medicare PPO | Admitting: Hospice and Palliative Medicine

## 2022-01-05 VITALS — BP 94/64 | HR 101 | Temp 97.4°F | Resp 16

## 2022-01-05 DIAGNOSIS — E278 Other specified disorders of adrenal gland: Secondary | ICD-10-CM | POA: Insufficient documentation

## 2022-01-05 DIAGNOSIS — E876 Hypokalemia: Secondary | ICD-10-CM | POA: Diagnosis not present

## 2022-01-05 DIAGNOSIS — Z5112 Encounter for antineoplastic immunotherapy: Secondary | ICD-10-CM | POA: Diagnosis not present

## 2022-01-05 DIAGNOSIS — Z79899 Other long term (current) drug therapy: Secondary | ICD-10-CM | POA: Diagnosis not present

## 2022-01-05 DIAGNOSIS — R059 Cough, unspecified: Secondary | ICD-10-CM | POA: Insufficient documentation

## 2022-01-05 DIAGNOSIS — Z87442 Personal history of urinary calculi: Secondary | ICD-10-CM | POA: Insufficient documentation

## 2022-01-05 DIAGNOSIS — Z9221 Personal history of antineoplastic chemotherapy: Secondary | ICD-10-CM | POA: Diagnosis not present

## 2022-01-05 DIAGNOSIS — E860 Dehydration: Secondary | ICD-10-CM | POA: Diagnosis not present

## 2022-01-05 DIAGNOSIS — E1165 Type 2 diabetes mellitus with hyperglycemia: Secondary | ICD-10-CM | POA: Insufficient documentation

## 2022-01-05 DIAGNOSIS — I10 Essential (primary) hypertension: Secondary | ICD-10-CM | POA: Insufficient documentation

## 2022-01-05 DIAGNOSIS — C3411 Malignant neoplasm of upper lobe, right bronchus or lung: Secondary | ICD-10-CM

## 2022-01-05 DIAGNOSIS — Z95828 Presence of other vascular implants and grafts: Secondary | ICD-10-CM

## 2022-01-05 LAB — COMPREHENSIVE METABOLIC PANEL
ALT: 14 U/L (ref 0–44)
AST: 17 U/L (ref 15–41)
Albumin: 3.1 g/dL — ABNORMAL LOW (ref 3.5–5.0)
Alkaline Phosphatase: 102 U/L (ref 38–126)
Anion gap: 9 (ref 5–15)
BUN: 10 mg/dL (ref 8–23)
CO2: 27 mmol/L (ref 22–32)
Calcium: 8.6 mg/dL — ABNORMAL LOW (ref 8.9–10.3)
Chloride: 101 mmol/L (ref 98–111)
Creatinine, Ser: 0.89 mg/dL (ref 0.61–1.24)
GFR, Estimated: >60 mL/min (ref >60)
Glucose, Bld: 188 mg/dL — ABNORMAL HIGH (ref 70–99)
Potassium: 3.5 mmol/L (ref 3.5–5.1)
Sodium: 137 mmol/L (ref 135–145)
Total Bilirubin: 0.4 mg/dL (ref 0.3–1.2)
Total Protein: 6.8 g/dL (ref 6.5–8.1)

## 2022-01-05 LAB — CBC WITH DIFFERENTIAL/PLATELET
Abs Immature Granulocytes: 0.06 10*3/uL (ref 0.00–0.07)
Basophils Absolute: 0 10*3/uL (ref 0.0–0.1)
Basophils Relative: 1 %
Eosinophils Absolute: 0.2 10*3/uL (ref 0.0–0.5)
Eosinophils Relative: 3 %
HCT: 33 % — ABNORMAL LOW (ref 39.0–52.0)
Hemoglobin: 10.3 g/dL — ABNORMAL LOW (ref 13.0–17.0)
Immature Granulocytes: 1 %
Lymphocytes Relative: 12 %
Lymphs Abs: 1.1 10*3/uL (ref 0.7–4.0)
MCH: 27.8 pg (ref 26.0–34.0)
MCHC: 31.2 g/dL (ref 30.0–36.0)
MCV: 89.2 fL (ref 80.0–100.0)
Monocytes Absolute: 0.6 10*3/uL (ref 0.1–1.0)
Monocytes Relative: 7 %
Neutro Abs: 6.5 10*3/uL (ref 1.7–7.7)
Neutrophils Relative %: 76 %
Platelets: 180 10*3/uL (ref 150–400)
RBC: 3.7 MIL/uL — ABNORMAL LOW (ref 4.22–5.81)
RDW: 16.1 % — ABNORMAL HIGH (ref 11.5–15.5)
WBC: 8.5 10*3/uL (ref 4.0–10.5)
nRBC: 0 % (ref 0.0–0.2)

## 2022-01-05 MED ORDER — SODIUM CHLORIDE 0.9 % IV SOLN
INTRAVENOUS | Status: DC
  Filled 2022-01-05: qty 250

## 2022-01-05 MED ORDER — LIDOCAINE-PRILOCAINE 2.5-2.5 % EX CREA: 1.0000 "application " | TOPICAL_CREAM | CUTANEOUS | 3 refills | Status: DC | PRN

## 2022-01-05 MED ORDER — HEPARIN SOD (PORK) LOCK FLUSH 100 UNIT/ML IV SOLN
500.0000 [IU] | Freq: Once | INTRAVENOUS | Status: AC
  Administered 2022-01-05: 500 [IU] via INTRAVENOUS
  Filled 2022-01-05: qty 5

## 2022-01-05 MED ORDER — PREDNISONE 10 MG PO TABS
10.0000 mg | ORAL_TABLET | Freq: Every day | ORAL | 0 refills | Status: DC
Start: 2022-01-05 — End: 2022-02-04

## 2022-01-05 MED ORDER — SODIUM CHLORIDE 0.9% FLUSH
10.0000 mL | Freq: Once | INTRAVENOUS | Status: AC
  Administered 2022-01-05: 10 mL via INTRAVENOUS
  Filled 2022-01-05: qty 10

## 2022-01-05 NOTE — Progress Notes (Signed)
Blood sample collected for additioinal liquid biopsy per Beckey Rutter, NP. Sample packaged in shipment kit with ice pack and sent via [employer] to [name]. Pt made aware and informed to expected results within the next 7 days once sample received by circulogene.  ?

## 2022-01-05 NOTE — Progress Notes (Signed)
Returns to symptom management for follow-up. Reports productive cough for the last few days. Pt is concerned about this, due to his hx of pneumonia. Denies fever.  ?

## 2022-01-05 NOTE — Progress Notes (Signed)
? ?[facility] ?[place]  ?[phone]) [phone] [phone] ? ?Patient Care Team: ?[name], MD as PCP - General (Family Medicine) ?[name], RN as Sales executive ?[name], MD as Consulting Physician (Urology) ?[name], MD as Consulting Physician (Oncology)  ? ?Name of the patient: Rodney Vega  ?[id number]  ?1956-07-08  ? ?Date of visit: 01/05/22 ? ?Reason for Consult: ?Angeles Paolucci is a 66 year old man with multiple medical problems including stage IIIa non-small cell lung cancer status post chemotherapy and radiation. ? ?Patient saw Beckey Rutter, NP on 12/31/2021.  CT of the chest, abdomen, and pelvis on 3/20/202 showed marked improvement in centrilobular nodularities in the left and right lung suggestive of improved infection.  Overall, cancer appeared stable without interval progression or distant metastatic disease. ? ?Patient poorly tolerated Imfinzi with development of pneumonia and declining performance status.  Liquid biopsies have been sent for NGS testing to explore future options for treatment.  Option of focusing on best supportive care/hospice was also offered.  However, patient has been inclined to pursue further treatment.  In the interim, he is scheduled for follow-up in [facility] for supportive care. ? ?Today, patient reports that he is overall feeling improved with increased appetite and slight weight gain.  He endorses several days of cough and respiratory congestion/wheezing that he attributes to allergies.  However, his wife is concerned for recurrent pneumonia. ? ?Denies any neurologic complaints. Denies any easy bleeding or bruising.  No fever or chills. Denies chest pain. Denies any nausea, vomiting, constipation, or diarrhea.  Patient offers no further specific complaints today. ? ?PAST MEDICAL HISTORY: ?Past Medical History:  ?Diagnosis Date  ? Cancer [facility])   ? Degenerative arthritis of knee   ? Depression    ? Diabetes mellitus without complication ([place])   ? Diverticulitis   ? Glaucoma   ? since 2004  ? Hernia 1979  ? History of kidney stones   ? Hypertension   ? Personal history of tobacco use, presenting hazards to health   ? Pre-diabetes   ? Screening for obesity   ? Special screening for malignant neoplasms, colon   ? Wears dentures   ? full upper and lower  ? ? ?PAST SURGICAL HISTORY:  ?Past Surgical History:  ?Procedure Laterality Date  ? BACK SURGERY  1994, 2012  ? lumbar bulging disc  ? CATARACT EXTRACTION W/PHACO Left 01/20/2021  ? Procedure: CATARACT EXTRACTION PHACO AND INTRAOCULAR LENS PLACEMENT ([place]) LEFT;  Surgeon: Birder Robson, MD;  Location: [place];  Service: Ophthalmology;  Laterality: Left;  10.13 ?0:52.1  ? COLONOSCOPY  [id number]  ? [facility]/Dr. Buelah Manis sessile polyp,25mm, found in rectum,multiple diverticula were found in the sigmoid, in descending and in transverse colon  ? COLONOSCOPY WITH PROPOFOL N/A 10/22/2021  ? Procedure: COLONOSCOPY WITH PROPOFOL;  Surgeon: Annamaria Helling, DO;  Location: [facility] ENDOSCOPY;  Service: Gastroenterology;  Laterality: N/A;  DM  ? COLOSTOMY  04-20-13  ? HERNIA REPAIR  1979  ? inguinal  ? IR IMAGING GUIDED PORT INSERTION  06/10/2021  ? JOINT REPLACEMENT Right   ? knee  ? KNEE ARTHROPLASTY Right 05/08/2018  ? Procedure: COMPUTER ASSISTED TOTAL KNEE ARTHROPLASTY;  Surgeon: Dereck Leep, MD;  Location: [facility] ORS;  Service: Orthopedics;  Laterality: Right;  ? KNEE ARTHROPLASTY Left 11/16/2019  ? Procedure: COMPUTER ASSISTED TOTAL KNEE ARTHROPLASTY;  Surgeon: Dereck Leep, MD;  Location: [facility] ORS;  Service: Orthopedics;  Laterality: Left;  ? LAPAROTOMY  04-20-13  ?  colon resection with colosotmy  ? LITHOTRIPSY  2013  ? [place]  ? ? ?HEMATOLOGY/ONCOLOGY HISTORY:  ?Oncology History Overview Note  ?AUG 2022- 8.2 x 5.9 cm spiculated mass noted in right upper lobe consistent ?with malignancy. It extends from the right hilum to the  lateral ?chest wall and results in lytic destruction of the lateral portion ?of the right fourth rib.  Mediastinal lymph nodes are noted, including 12 mm right hilar lymph ?node, consistent with metastatic disease. 3 cm right adrenal mass is noted concerning for metastatic disease. ? ?# AUG 2022- PET IMPRESSION: ?Peripherally hypermetabolic right upper lobe mass, likely due to ?central necrosis. Mass extends into the right hilum and right ?lateral chest wall involving the second through fourth right ribs. ?  ?Mildly enlarged and hypermetabolic right hilar lymph node. ?  ?No evidence of metastatic disease in the abdomen or pelvis. ? ?# AUG, 2022-RIGHT CHEST WALL Bx-necrosis; suspicious for malignancy not definitive [discussed with Dr.Kraynie;MDT] ?LUNG MASS, RIGHT; BIOPSY:  ?- SUSPICIOUS FOR MALIGNANCY.  ?- SEE COMMENT.  ? ?Comment:  ?Approximately six tissue cores demonstrate inflamed fibrous capsule with  ?hemosiderin-laden macrophages, in a background of abundant necrosis.  ?One of the cores demonstrates a minute fragment of viable epithelial  ?cells.  These cells are positive for p40.  They are negative for TTF1  ?and CK7. The cells of interest disappear on deeper cut levels.  While  ?the findings are suspicious for squamous cell carcinoma, there is not  ?enough viable tumor for a definitive diagnosis.  Additional tissue  ?sampling may be helpful.  ? ?# SEP 2022-cycle #2 Taxol hypersensitive reaction.  Switched over to #3 cycle- carbo-Abraxane starting 06/29/2021 ? ?# DEC 2022- s/p ID- Dr.ravishankar- ? Lung abscess-no antibiotics- ? 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 [name] surgery for now] ? ?# S/p evaluation with Dr. [name]. ?  ?Primary cancer of right upper lobe of lung ([place])  ?06/03/2021 Initial Diagnosis  ? Primary cancer of right upper lobe of lung [facility]) ?  ?06/03/2021 Cancer Staging  ? Staging form: Lung, AJCC 8th  Edition ?- Clinical: Stage IIIB (cT3, cN2, cM0) - Signed by [name], MD on 06/03/2021 ?  ?06/15/2021 - 08/03/2021 Chemotherapy  ? Patient is on Treatment Plan : LUNG Carboplatin / Paclitaxel + XRT q7d  ?   ?10/02/2021 -  Chemotherapy  ? Patient is on Treatment Plan : LUNG Durvalumab q14d  ?   ? ? ?ALLERGIES:  is allergic to paclitaxel, ambien [zolpidem], nsaids, and aleve [naproxen sodium]. ? ?MEDICATIONS:  ?Current Outpatient Medications  ?Medication Sig Dispense Refill  ? acetaminophen (TYLENOL) 500 MG tablet Take 2 tablets (1,000 mg total) by mouth daily as needed. Home med. 30 tablet 0  ? albuterol (ACCUNEB) 0.63 MG/3ML nebulizer solution Take 3 mLs (0.63 mg total) by nebulization every 4 (four) hours as needed. Per pharmacy 0.83% dose available (this dose not available in Epic).  This dose was called in to [employer] 360 mL 3  ? albuterol (VENTOLIN HFA) 108 (90 Base) MCG/ACT inhaler Inhale 2 puffs into the lungs every 6 (six) hours as needed for wheezing or shortness of breath. 1 each 2  ? baclofen (LIORESAL) 10 MG tablet Take 5 mg by mouth at bedtime as needed for muscle spasms.    ? blood glucose meter kit and supplies KIT Check your blood glucose levels twice a day; once in the morning before  breakfast; and once in the evening after dinner. 1 each 0  ? buPROPion (WELLBUTRIN XL) 150 MG 24 hr tablet Take 1 tablet (150 mg total) by mouth daily. 30 tablet 2  ? cetirizine (ZYRTEC) 10 MG tablet Take 10 mg by mouth daily.    ? Cyanocobalamin (VITAMIN B-12 PO) Take by mouth daily.    ? dextromethorphan-guaiFENesin (MUCINEX DM) 30-600 MG 12hr tablet Take 1 tablet by mouth 2 (two) times daily as needed for cough.    ? diphenhydramine-acetaminophen (TYLENOL PM) 25-500 MG TABS Take 1-2 tablets by mouth at bedtime as needed (sleep.).     ? dorzolamide-timolol (COSOPT) 22.3-6.8 MG/ML ophthalmic solution Place 1 drop into both eyes 2 (two) times daily.    ? eszopiclone (LUNESTA) 1 MG TABS tablet Take 1  tablet (1 mg total) by mouth at bedtime as needed for sleep. Take immediately before bedtime 30 tablet 0  ? feeding supplement (ENSURE ENLIVE / ENSURE PLUS) LIQD Take 237 mLs by mouth 3 (three) times dai

## 2022-01-07 ENCOUNTER — Inpatient Hospital Stay: Payer: Medicare PPO

## 2022-01-07 ENCOUNTER — Encounter: Payer: Self-pay | Admitting: Nurse Practitioner

## 2022-01-07 VITALS — BP 109/66 | HR 94 | Temp 98.2°F | Resp 18 | Wt 177.5 lb

## 2022-01-07 DIAGNOSIS — Z5112 Encounter for antineoplastic immunotherapy: Secondary | ICD-10-CM | POA: Diagnosis not present

## 2022-01-07 DIAGNOSIS — C3411 Malignant neoplasm of upper lobe, right bronchus or lung: Secondary | ICD-10-CM

## 2022-01-07 LAB — COMPREHENSIVE METABOLIC PANEL
ALT: 13 U/L (ref 0–44)
AST: 17 U/L (ref 15–41)
Albumin: 3.1 g/dL — ABNORMAL LOW (ref 3.5–5.0)
Alkaline Phosphatase: 106 U/L (ref 38–126)
Anion gap: 8 (ref 5–15)
BUN: 9 mg/dL (ref 8–23)
CO2: 27 mmol/L (ref 22–32)
Calcium: 8.6 mg/dL — ABNORMAL LOW (ref 8.9–10.3)
Chloride: 102 mmol/L (ref 98–111)
Creatinine, Ser: 0.84 mg/dL (ref 0.61–1.24)
GFR, Estimated: >60 mL/min (ref >60)
Glucose, Bld: 176 mg/dL — ABNORMAL HIGH (ref 70–99)
Potassium: 3.6 mmol/L (ref 3.5–5.1)
Sodium: 137 mmol/L (ref 135–145)
Total Bilirubin: 0.3 mg/dL (ref 0.3–1.2)
Total Protein: 6.8 g/dL (ref 6.5–8.1)

## 2022-01-07 MED ORDER — SODIUM CHLORIDE 0.9 % IV SOLN: 10.0000 mg | Freq: Once | INTRAVENOUS | Status: DC

## 2022-01-07 MED ORDER — SODIUM CHLORIDE 0.9 % IV SOLN
Freq: Once | INTRAVENOUS | Status: AC
  Filled 2022-01-07: qty 250

## 2022-01-07 MED ORDER — SODIUM CHLORIDE 0.9% FLUSH
10.0000 mL | Freq: Once | INTRAVENOUS | Status: AC | PRN
  Administered 2022-01-07: 10 mL
  Filled 2022-01-07: qty 10

## 2022-01-07 MED ORDER — HEPARIN SOD (PORK) LOCK FLUSH 100 UNIT/ML IV SOLN
500.0000 [IU] | Freq: Once | INTRAVENOUS | Status: AC | PRN
  Administered 2022-01-07: 500 [IU]
  Filled 2022-01-07: qty 5

## 2022-01-07 MED ORDER — DEXAMETHASONE SODIUM PHOSPHATE 10 MG/ML IJ SOLN
10.0000 mg | Freq: Once | INTRAMUSCULAR | Status: AC
  Administered 2022-01-07: 10 mg via INTRAVENOUS

## 2022-01-07 NOTE — Progress Notes (Signed)
IVF's today. No electrolytes needed to be added. Received 10 mg IV Decadron per request. Pt states he is snacking a little more often in the evenings. VSS. ?Discharged to home, accompanied by wife. ?

## 2022-01-12 ENCOUNTER — Inpatient Hospital Stay (HOSPITAL_BASED_OUTPATIENT_CLINIC_OR_DEPARTMENT_OTHER): Payer: Medicare PPO | Admitting: Hospice and Palliative Medicine

## 2022-01-12 ENCOUNTER — Inpatient Hospital Stay: Payer: Medicare PPO

## 2022-01-12 VITALS — BP 95/59 | HR 100 | Temp 97.9°F | Resp 16

## 2022-01-12 DIAGNOSIS — C3411 Malignant neoplasm of upper lobe, right bronchus or lung: Secondary | ICD-10-CM

## 2022-01-12 DIAGNOSIS — E876 Hypokalemia: Secondary | ICD-10-CM

## 2022-01-12 DIAGNOSIS — Z5112 Encounter for antineoplastic immunotherapy: Secondary | ICD-10-CM | POA: Diagnosis not present

## 2022-01-12 LAB — COMPREHENSIVE METABOLIC PANEL
ALT: 13 U/L (ref 0–44)
AST: 20 U/L (ref 15–41)
Albumin: 3.1 g/dL — ABNORMAL LOW (ref 3.5–5.0)
Alkaline Phosphatase: 94 U/L (ref 38–126)
Anion gap: 9 (ref 5–15)
BUN: 9 mg/dL (ref 8–23)
CO2: 25 mmol/L (ref 22–32)
Calcium: 8.5 mg/dL — ABNORMAL LOW (ref 8.9–10.3)
Chloride: 101 mmol/L (ref 98–111)
Creatinine, Ser: 0.91 mg/dL (ref 0.61–1.24)
GFR, Estimated: >60 mL/min (ref >60)
Glucose, Bld: 281 mg/dL — ABNORMAL HIGH (ref 70–99)
Potassium: 3 mmol/L — ABNORMAL LOW (ref 3.5–5.1)
Sodium: 135 mmol/L (ref 135–145)
Total Bilirubin: 0.3 mg/dL (ref 0.3–1.2)
Total Protein: 6.6 g/dL (ref 6.5–8.1)

## 2022-01-12 LAB — CBC WITH DIFFERENTIAL/PLATELET
Abs Immature Granulocytes: 0.08 10*3/uL — ABNORMAL HIGH (ref 0.00–0.07)
Basophils Absolute: 0.1 10*3/uL (ref 0.0–0.1)
Basophils Relative: 1 %
Eosinophils Absolute: 0.2 10*3/uL (ref 0.0–0.5)
Eosinophils Relative: 3 %
HCT: 32.9 % — ABNORMAL LOW (ref 39.0–52.0)
Hemoglobin: 10.3 g/dL — ABNORMAL LOW (ref 13.0–17.0)
Immature Granulocytes: 1 %
Lymphocytes Relative: 11 %
Lymphs Abs: 0.9 10*3/uL (ref 0.7–4.0)
MCH: 28.2 pg (ref 26.0–34.0)
MCHC: 31.3 g/dL (ref 30.0–36.0)
MCV: 90.1 fL (ref 80.0–100.0)
Monocytes Absolute: 0.6 10*3/uL (ref 0.1–1.0)
Monocytes Relative: 7 %
Neutro Abs: 6.6 10*3/uL (ref 1.7–7.7)
Neutrophils Relative %: 77 %
Platelets: 226 10*3/uL (ref 150–400)
RBC: 3.65 MIL/uL — ABNORMAL LOW (ref 4.22–5.81)
RDW: 16.4 % — ABNORMAL HIGH (ref 11.5–15.5)
WBC: 8.5 10*3/uL (ref 4.0–10.5)
nRBC: 0 % (ref 0.0–0.2)

## 2022-01-12 MED ORDER — POTASSIUM CHLORIDE 20 MEQ/100ML IV SOLN
20.0000 meq | Freq: Once | INTRAVENOUS | Status: AC
  Administered 2022-01-12: 20 meq via INTRAVENOUS

## 2022-01-12 MED ORDER — SODIUM CHLORIDE 0.9 % IV SOLN
Freq: Once | INTRAVENOUS | Status: AC
  Filled 2022-01-12: qty 250

## 2022-01-12 MED ORDER — DEXAMETHASONE SODIUM PHOSPHATE 10 MG/ML IJ SOLN
10.0000 mg | Freq: Once | INTRAMUSCULAR | Status: AC
  Administered 2022-01-12: 10 mg via INTRAVENOUS
  Filled 2022-01-12: qty 1

## 2022-01-12 MED ORDER — SODIUM CHLORIDE 0.9 % IV SOLN: 10.0000 mg | Freq: Once | INTRAVENOUS | Status: DC

## 2022-01-12 MED ORDER — POTASSIUM CHLORIDE CRYS ER 20 MEQ PO TBCR: 20.0000 meq | EXTENDED_RELEASE_TABLET | Freq: Every day | ORAL | 0 refills | Status: DC

## 2022-01-12 MED ORDER — SODIUM CHLORIDE 0.9% FLUSH
10.0000 mL | Freq: Once | INTRAVENOUS | Status: AC | PRN
  Administered 2022-01-12: 10 mL
  Filled 2022-01-12: qty 10

## 2022-01-12 MED ORDER — HEPARIN SOD (PORK) LOCK FLUSH 100 UNIT/ML IV SOLN
500.0000 [IU] | Freq: Once | INTRAVENOUS | Status: AC | PRN
  Administered 2022-01-12: 500 [IU]
  Filled 2022-01-12: qty 5

## 2022-01-12 MED ORDER — METFORMIN HCL 500 MG PO TABS: 500.0000 mg | ORAL_TABLET | Freq: Two times a day (BID) | ORAL | 2 refills | Status: DC

## 2022-01-12 NOTE — Patient Instructions (Signed)
Potassium Chloride Injection ?What is this medication? ?POTASSIUM CHLORIDE (poe TASS i um KLOOR ide) prevents and treats low levels of potassium in your body. Potassium plays an important role in maintaining the health of your kidneys, heart, muscles, and nervous system. ?This medicine may be used for other purposes; ask your health care provider or pharmacist if you have questions. ?COMMON BRAND NAME(S): PROAMP ?What should I tell my care team before I take this medication? ?They need to know if you have any of these conditions: ?Addison disease ?Dehydration ?Diabetes (high blood sugar) ?Heart disease ?High levels of potassium in the blood ?Irregular heartbeat or rhythm ?Kidney disease ?Large areas of burned skin ?An unusual or allergic reaction to potassium, other medications, foods, dyes, or preservatives ?Pregnant or trying to get pregnant ?Breast-feeding ?How should I use this medication? ?This medication is injected into a vein. It is given in a hospital or clinic setting. ?Talk to your care team about the use of this medication in children. Special care may be needed. ?Overdosage: If you think you have taken too much of this medicine contact a poison control center or emergency room at once. ?NOTE: This medicine is only for you. Do not share this medicine with others. ?What if I miss a dose? ?This does not apply. This medication is not for regular use. ?What may interact with this medication? ?Do not take this medication with any of the following: ?Certain diuretics such as spironolactone, triamterene ?Eplerenone ?Sodium polystyrene sulfonate ?This medication may also interact with the following: ?Certain medications for blood pressure or heart disease like lisinopril, losartan, quinapril, valsartan ?Medications that lower your chance of fighting infection such as cyclosporine, tacrolimus ?NSAIDs, medications for pain and inflammation, like ibuprofen or naproxen ?Other potassium supplements ?Salt  substitutes ?This list may not describe all possible interactions. Give your health care provider a list of all the medicines, herbs, non-prescription drugs, or dietary supplements you use. Also tell them if you smoke, drink alcohol, or use illegal drugs. Some items may interact with your medicine. ?What should I watch for while using this medication? ?Visit your care team for regular checks on your progress. Tell your care team if your symptoms do not start to get better or if they get worse. ?You may need blood work while you are taking this medication. ?Avoid salt substitutes unless you are told otherwise by your care team. ?What side effects may I notice from receiving this medication? ?Side effects that you should report to your care team as soon as possible: ?Allergic reactions--skin rash, itching, hives, swelling of the face, lips, tongue, or throat ?High potassium level--muscle weakness, fast or irregular heartbeat ?Side effects that usually do not require medical attention (report to your care team if they continue or are bothersome): ?Diarrhea ?Nausea ?Stomach pain ?Vomiting ?This list may not describe all possible side effects. Call your doctor for medical advice about side effects. You may report side effects to FDA at 1-800-FDA-1088. ?Where should I keep my medication? ?This medication is given in a hospital or clinic. It will not be stored at home. ?NOTE: This sheet is a summary. It may not cover all possible information. If you have questions about this medicine, talk to your doctor, pharmacist, or health care provider. ?? 2022 Elsevier/Gold Standard (2021-01-06 00:00:00) ? ?

## 2022-01-12 NOTE — Progress Notes (Signed)
? ?[facility] ?[place]  ?Telephone:(336) [phone] [phone] ? ?Patient Care Team: ?[name], MD as PCP - General (Family Medicine) ?[name], RN as Sales executive ?[name], MD as Consulting Physician (Urology) ?[name], MD as Consulting Physician (Oncology)  ? ?Name of the patient: Rodney Vega  ?[id number]  ?Sep 21, 1956  ? ?Date of visit: 01/12/22 ? ?Reason for Consult: ?Cauy Melody is a 66 year old man with multiple medical problems including stage IIIa non-small cell lung cancer status post chemotherapy and radiation. ? ?Patient saw Beckey Rutter, NP on 12/31/2021.  CT of the chest, abdomen, and pelvis on 3/20/202 showed marked improvement in centrilobular nodularities in the left and right lung suggestive of improved infection.  Overall, cancer appeared stable without interval progression or distant metastatic disease. ? ?Patient poorly tolerated Imfinzi with development of pneumonia and declining performance status.  Liquid biopsies have been sent for NGS testing to explore future options for treatment.  Option of focusing on best supportive care/hospice was also offered.  However, patient has been inclined to pursue further treatment.  In the interim, he is scheduled for follow-up in [facility] for supportive care. ? ?Today, patient reports he is doing reasonably well.  Continues to endorse improved appetite and slow weight gain.  He also continues to endorse wheezing/nonproductive cough, which he associates with allergies/pollen.  He has not yet started the prednisone.  Chest x-ray did not reveal any acute pulmonary processes. ? ?Denies any neurologic complaints. Denies any easy bleeding or bruising.  No fever or chills. Denies chest pain. Denies any nausea, vomiting, constipation, or diarrhea.  Patient offers no further specific complaints today. ? ?PAST MEDICAL HISTORY: ?Past Medical History:  ?Diagnosis Date  ? Cancer [facility])    ? Degenerative arthritis of knee   ? Depression   ? Diabetes mellitus without complication ([place])   ? Diverticulitis   ? Glaucoma   ? since 2004  ? Hernia 1979  ? History of kidney stones   ? Hypertension   ? Personal history of tobacco use, presenting hazards to health   ? Pre-diabetes   ? Screening for obesity   ? Special screening for malignant neoplasms, colon   ? Wears dentures   ? full upper and lower  ? ? ?PAST SURGICAL HISTORY:  ?Past Surgical History:  ?Procedure Laterality Date  ? BACK SURGERY  1994, 2012  ? lumbar bulging disc  ? CATARACT EXTRACTION W/PHACO Left 01/20/2021  ? Procedure: CATARACT EXTRACTION PHACO AND INTRAOCULAR LENS PLACEMENT ([place]) LEFT;  Surgeon: Birder Robson, MD;  Location: [place];  Service: Ophthalmology;  Laterality: Left;  10.13 ?0:52.1  ? COLONOSCOPY  [id number]  ? [facility]/Dr. Buelah Manis sessile polyp,41mm, found in rectum,multiple diverticula were found in the sigmoid, in descending and in transverse colon  ? COLONOSCOPY WITH PROPOFOL N/A 10/22/2021  ? Procedure: COLONOSCOPY WITH PROPOFOL;  Surgeon: Annamaria Helling, DO;  Location: [facility] ENDOSCOPY;  Service: Gastroenterology;  Laterality: N/A;  DM  ? COLOSTOMY  04-20-13  ? HERNIA REPAIR  1979  ? inguinal  ? IR IMAGING GUIDED PORT INSERTION  06/10/2021  ? JOINT REPLACEMENT Right   ? knee  ? KNEE ARTHROPLASTY Right 05/08/2018  ? Procedure: COMPUTER ASSISTED TOTAL KNEE ARTHROPLASTY;  Surgeon: Dereck Leep, MD;  Location: [facility] OR[facility];  Service: Orthopedics;  Laterality: Right;  ? KNEE ARTHROPLASTY Left 11/16/2019  ? Procedure: COMPUTER ASSISTED TOTAL KNEE ARTHROPLASTY;  Surgeon: Dereck Leep, MD;  Location: [facility] ORS;  Service: Orthopedics;  Laterality: Left;  ? LAPAROTOMY  04-20-13  ? colon resection with colosotmy  ? LITHOTRIPSY  2013  ? [place]  ? ? ?HEMATOLOGY/ONCOLOGY HISTORY:  ?Oncology History Overview Note  ?AUG 2022- 8.2 x 5.9 cm spiculated mass noted in right upper lobe consistent ?with  malignancy. It extends from the right hilum to the lateral ?chest wall and results in lytic destruction of the lateral portion ?of the right fourth rib.  Mediastinal lymph nodes are noted, including 12 mm right hilar lymph ?node, consistent with metastatic disease. 3 cm right adrenal mass is noted concerning for metastatic disease. ? ?# AUG 2022- PET IMPRESSION: ?Peripherally hypermetabolic right upper lobe mass, likely due to ?central necrosis. Mass extends into the right hilum and right ?lateral chest wall involving the second through fourth right ribs. ?  ?Mildly enlarged and hypermetabolic right hilar lymph node. ?  ?No evidence of metastatic disease in the abdomen or pelvis. ? ?# AUG, 2022-RIGHT CHEST WALL Bx-necrosis; suspicious for malignancy not definitive [discussed with Dr.Kraynie;MDT] ?LUNG MASS, RIGHT; BIOPSY:  ?- SUSPICIOUS FOR MALIGNANCY.  ?- SEE COMMENT.  ? ?Comment:  ?Approximately six tissue cores demonstrate inflamed fibrous capsule with  ?hemosiderin-laden macrophages, in a background of abundant necrosis.  ?One of the cores demonstrates a minute fragment of viable epithelial  ?cells.  These cells are positive for p40.  They are negative for TTF1  ?and CK7. The cells of interest disappear on deeper cut levels.  While  ?the findings are suspicious for squamous cell carcinoma, there is not  ?enough viable tumor for a definitive diagnosis.  Additional tissue  ?sampling may be helpful.  ? ?# SEP 2022-cycle #2 Taxol hypersensitive reaction.  Switched over to #3 cycle- carbo-Abraxane starting 06/29/2021 ? ?# DEC 2022- s/p ID- Dr.ravishankar- ? Lung abscess-no antibiotics- ? 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 [name] surgery for now] ? ?# S/p evaluation with Dr. [name]. ?  ?Primary cancer of right upper lobe of lung ([place])  ?06/03/2021 Initial Diagnosis  ? Primary cancer of right upper lobe of lung [facility]) ?   ?06/03/2021 Cancer Staging  ? Staging form: Lung, AJCC 8th Edition ?- Clinical: Stage IIIB (cT3, cN2, cM0) - Signed by [name], MD on 06/03/2021 ?  ?06/15/2021 - 08/03/2021 Chemotherapy  ? Patient is on Treatment Plan : LUNG Carboplatin / Paclitaxel + XRT q7d  ?   ?10/02/2021 -  Chemotherapy  ? Patient is on Treatment Plan : LUNG Durvalumab q14d  ?   ? ? ?ALLERGIES:  is allergic to paclitaxel, ambien [zolpidem], nsaids, and aleve [naproxen sodium]. ? ?MEDICATIONS:  ?Current Outpatient Medications  ?Medication Sig Dispense Refill  ? acetaminophen (TYLENOL) 500 MG tablet Take 2 tablets (1,000 mg total) by mouth daily as needed. Home med. 30 tablet 0  ? albuterol (ACCUNEB) 0.63 MG/3ML nebulizer solution Take 3 mLs (0.63 mg total) by nebulization every 4 (four) hours as needed. Per pharmacy 0.83% dose available (this dose not available in Epic).  This dose was called in to [employer] 360 mL 3  ? albuterol (VENTOLIN HFA) 108 (90 Base) MCG/ACT inhaler Inhale 2 puffs into the lungs every 6 (six) hours as needed for wheezing or shortness of breath. 1 each 2  ? baclofen (LIORESAL) 10 MG tablet Take 5 mg by mouth at bedtime as needed for muscle spasms.    ? blood glucose meter kit and supplies KIT Check your blood glucose  levels twice a day; once in the morning before breakfast; and once in the evening after dinner. 1 each 0  ? buPROPion (WELLBUTRIN XL) 150 MG 24 hr tablet Take 1 tablet (150 mg total) by mouth daily. 30 tablet 2  ? cetirizine (ZYRTEC) 10 MG tablet Take 10 mg by mouth daily.    ? Cyanocobalamin (VITAMIN B-12 PO) Take by mouth daily.    ? dextromethorphan-guaiFENesin (MUCINEX DM) 30-600 MG 12hr tablet Take 1 tablet by mouth 2 (two) times daily as needed for cough.    ? diphenhydramine-acetaminophen (TYLENOL PM) 25-500 MG TABS Take 1-2 tablets by mouth at bedtime as needed (sleep.).     ? dorzolamide-timolol (COSOPT) 22.3-6.8 MG/ML ophthalmic solution Place 1 drop into both eyes 2 (two) times  daily.    ? eszopiclone (LUNESTA) 1 MG TABS tablet Take 1 tablet (1 mg total) by mouth at bedtime as needed for sleep. Take immediately before bedtime 30 tablet 0  ? feeding supplement (ENSURE ENLIVE / ENSURE PLUS

## 2022-01-12 NOTE — Progress Notes (Signed)
Here for f/u. Reports right sided thoracic pain, worse when coughing. He states that he has fentanyl patches, but has not used them.  ?

## 2022-01-12 NOTE — Progress Notes (Signed)
IV fluids today +addition of Potassium. Tolerated well. Discharged to home. Accompanied by wife. ?

## 2022-01-14 ENCOUNTER — Inpatient Hospital Stay: Payer: Medicare PPO

## 2022-01-14 ENCOUNTER — Encounter: Payer: Self-pay | Admitting: Oncology

## 2022-01-14 VITALS — BP 140/81 | HR 103 | Temp 98.0°F | Resp 17 | Wt 178.2 lb

## 2022-01-14 DIAGNOSIS — C3490 Malignant neoplasm of unspecified part of unspecified bronchus or lung: Secondary | ICD-10-CM

## 2022-01-14 DIAGNOSIS — C3411 Malignant neoplasm of upper lobe, right bronchus or lung: Secondary | ICD-10-CM

## 2022-01-14 DIAGNOSIS — Z5112 Encounter for antineoplastic immunotherapy: Secondary | ICD-10-CM | POA: Diagnosis not present

## 2022-01-14 DIAGNOSIS — Z95828 Presence of other vascular implants and grafts: Secondary | ICD-10-CM

## 2022-01-14 DIAGNOSIS — E876 Hypokalemia: Secondary | ICD-10-CM

## 2022-01-14 LAB — COMPREHENSIVE METABOLIC PANEL
ALT: 12 U/L (ref 0–44)
AST: 25 U/L (ref 15–41)
Albumin: 3 g/dL — ABNORMAL LOW (ref 3.5–5.0)
Alkaline Phosphatase: 89 U/L (ref 38–126)
Anion gap: 11 (ref 5–15)
BUN: 10 mg/dL (ref 8–23)
CO2: 25 mmol/L (ref 22–32)
Calcium: 8.6 mg/dL — ABNORMAL LOW (ref 8.9–10.3)
Chloride: 103 mmol/L (ref 98–111)
Creatinine, Ser: 0.88 mg/dL (ref 0.61–1.24)
GFR, Estimated: >60 mL/min (ref >60)
Glucose, Bld: 255 mg/dL — ABNORMAL HIGH (ref 70–99)
Potassium: 3.2 mmol/L — ABNORMAL LOW (ref 3.5–5.1)
Sodium: 139 mmol/L (ref 135–145)
Total Bilirubin: 0.3 mg/dL (ref 0.3–1.2)
Total Protein: 6.6 g/dL (ref 6.5–8.1)

## 2022-01-14 MED ORDER — SODIUM CHLORIDE 0.9 % IV SOLN
INTRAVENOUS | Status: AC
  Filled 2022-01-14 (×2): qty 250

## 2022-01-14 MED ORDER — SODIUM CHLORIDE 0.9% FLUSH
10.0000 mL | Freq: Once | INTRAVENOUS | Status: AC
  Administered 2022-01-14: 10 mL via INTRAVENOUS
  Filled 2022-01-14: qty 10

## 2022-01-14 MED ORDER — POTASSIUM CHLORIDE 20 MEQ/100ML IV SOLN
20.0000 meq | Freq: Once | INTRAVENOUS | Status: AC
  Administered 2022-01-14: 20 meq via INTRAVENOUS

## 2022-01-14 MED ORDER — HEPARIN SOD (PORK) LOCK FLUSH 100 UNIT/ML IV SOLN
500.0000 [IU] | Freq: Once | INTRAVENOUS | Status: AC
  Administered 2022-01-14: 500 [IU] via INTRAVENOUS
  Filled 2022-01-14: qty 5

## 2022-01-14 NOTE — Progress Notes (Signed)
Nutrition Follow-up: ? ?Patient with lung cancer, imfinzi poorly tolerated.   ? ?Met with patient and wife during IV fluids and potassium.  Patient says that appetite is better but still has up and down days.  Drinking some oral nutrition supplements. Eating some solid food as well, sesame chicken yesterday for lunch. [place] food for dinner last night.   ? ? ?Medications: reviewed ? ?Labs: reviewed ? ?Anthropometrics:  ? ?Weight 178 lb, increased ?177 lb on 3/6 ? ?184 lb on 2/21 ?181 lb on 2/15 ?189 lb on 2/3 ? ? ?NUTRITION DIAGNOSIS: Inadequate oral intake stable ? ? ?INTERVENTION:  ?Continue oral nutrition supplements as able ?Discussed foods high in potassium ?Continue high calorie, high protein foods ?  ? ?MONITORING, EVALUATION, GOAL: weight trends, intake ? ? ?NEXT VISIT: as needed ? ?Evelen Vazguez B. Zenia Resides, RD, LDN ?Registered Dietitian ?[phone] ? ? ?

## 2022-01-19 ENCOUNTER — Inpatient Hospital Stay: Payer: Medicare PPO

## 2022-01-19 ENCOUNTER — Other Ambulatory Visit: Payer: Self-pay | Admitting: *Deleted

## 2022-01-19 VITALS — BP 119/65 | HR 95 | Resp 16 | Wt 180.1 lb

## 2022-01-19 DIAGNOSIS — Z5112 Encounter for antineoplastic immunotherapy: Secondary | ICD-10-CM | POA: Diagnosis not present

## 2022-01-19 DIAGNOSIS — E876 Hypokalemia: Secondary | ICD-10-CM

## 2022-01-19 DIAGNOSIS — C3411 Malignant neoplasm of upper lobe, right bronchus or lung: Secondary | ICD-10-CM

## 2022-01-19 LAB — BASIC METABOLIC PANEL
Anion gap: 8 (ref 5–15)
BUN: 10 mg/dL (ref 8–23)
CO2: 26 mmol/L (ref 22–32)
Calcium: 8.8 mg/dL — ABNORMAL LOW (ref 8.9–10.3)
Chloride: 101 mmol/L (ref 98–111)
Creatinine, Ser: 0.92 mg/dL (ref 0.61–1.24)
GFR, Estimated: >60 mL/min (ref >60)
Glucose, Bld: 260 mg/dL — ABNORMAL HIGH (ref 70–99)
Potassium: 3.7 mmol/L (ref 3.5–5.1)
Sodium: 135 mmol/L (ref 135–145)

## 2022-01-19 MED ORDER — HEPARIN SOD (PORK) LOCK FLUSH 100 UNIT/ML IV SOLN
500.0000 [IU] | Freq: Once | INTRAVENOUS | Status: AC | PRN
  Administered 2022-01-19: 500 [IU]
  Filled 2022-01-19: qty 5

## 2022-01-19 MED ORDER — SODIUM CHLORIDE 0.9% FLUSH
10.0000 mL | Freq: Once | INTRAVENOUS | Status: AC | PRN
  Administered 2022-01-19: 10 mL
  Filled 2022-01-19: qty 10

## 2022-01-19 MED ORDER — DEXAMETHASONE SODIUM PHOSPHATE 10 MG/ML IJ SOLN
10.0000 mg | Freq: Once | INTRAMUSCULAR | Status: AC
  Administered 2022-01-19: 10 mg via INTRAVENOUS

## 2022-01-19 MED ORDER — LEVALBUTEROL HCL 0.63 MG/3ML IN NEBU: 0.6300 mg | INHALATION_SOLUTION | Freq: Four times a day (QID) | RESPIRATORY_TRACT | 12 refills | Status: DC | PRN

## 2022-01-19 MED ORDER — SODIUM CHLORIDE 0.9 % IV SOLN
Freq: Once | INTRAVENOUS | Status: AC
  Filled 2022-01-19: qty 250

## 2022-01-19 MED ORDER — SODIUM CHLORIDE 0.9 % IV SOLN
10.0000 mg | Freq: Once | INTRAVENOUS | Status: DC
  Filled 2022-01-19: qty 1

## 2022-01-19 MED ORDER — DRONABINOL 5 MG PO CAPS: 5.0000 mg | ORAL_CAPSULE | Freq: Two times a day (BID) | ORAL | 0 refills | Status: DC

## 2022-01-21 ENCOUNTER — Telehealth: Payer: Self-pay

## 2022-01-21 ENCOUNTER — Inpatient Hospital Stay: Payer: Medicare PPO

## 2022-01-21 ENCOUNTER — Encounter: Payer: Self-pay | Admitting: Internal Medicine

## 2022-01-21 ENCOUNTER — Other Ambulatory Visit: Payer: Self-pay | Admitting: Internal Medicine

## 2022-01-21 ENCOUNTER — Inpatient Hospital Stay (HOSPITAL_BASED_OUTPATIENT_CLINIC_OR_DEPARTMENT_OTHER): Payer: Medicare PPO | Admitting: Internal Medicine

## 2022-01-21 DIAGNOSIS — C3411 Malignant neoplasm of upper lobe, right bronchus or lung: Secondary | ICD-10-CM

## 2022-01-21 DIAGNOSIS — Z5112 Encounter for antineoplastic immunotherapy: Secondary | ICD-10-CM | POA: Diagnosis not present

## 2022-01-21 LAB — COMPREHENSIVE METABOLIC PANEL
ALT: 11 U/L (ref 0–44)
AST: 14 U/L — ABNORMAL LOW (ref 15–41)
Albumin: 3.4 g/dL — ABNORMAL LOW (ref 3.5–5.0)
Alkaline Phosphatase: 87 U/L (ref 38–126)
Anion gap: 8 (ref 5–15)
BUN: 10 mg/dL (ref 8–23)
CO2: 27 mmol/L (ref 22–32)
Calcium: 9 mg/dL (ref 8.9–10.3)
Chloride: 102 mmol/L (ref 98–111)
Creatinine, Ser: 0.9 mg/dL (ref 0.61–1.24)
GFR, Estimated: >60 mL/min (ref >60)
Glucose, Bld: 189 mg/dL — ABNORMAL HIGH (ref 70–99)
Potassium: 3.4 mmol/L — ABNORMAL LOW (ref 3.5–5.1)
Sodium: 137 mmol/L (ref 135–145)
Total Bilirubin: 0.4 mg/dL (ref 0.3–1.2)
Total Protein: 7 g/dL (ref 6.5–8.1)

## 2022-01-21 LAB — CBC WITH DIFFERENTIAL/PLATELET
Abs Immature Granulocytes: 0.06 10*3/uL (ref 0.00–0.07)
Basophils Absolute: 0.1 10*3/uL (ref 0.0–0.1)
Basophils Relative: 1 %
Eosinophils Absolute: 0.2 10*3/uL (ref 0.0–0.5)
Eosinophils Relative: 2 %
HCT: 36 % — ABNORMAL LOW (ref 39.0–52.0)
Hemoglobin: 11.1 g/dL — ABNORMAL LOW (ref 13.0–17.0)
Immature Granulocytes: 1 %
Lymphocytes Relative: 12 %
Lymphs Abs: 1.3 10*3/uL (ref 0.7–4.0)
MCH: 28.2 pg (ref 26.0–34.0)
MCHC: 30.8 g/dL (ref 30.0–36.0)
MCV: 91.4 fL (ref 80.0–100.0)
Monocytes Absolute: 0.8 10*3/uL (ref 0.1–1.0)
Monocytes Relative: 7 %
Neutro Abs: 8.7 10*3/uL — ABNORMAL HIGH (ref 1.7–7.7)
Neutrophils Relative %: 77 %
Platelets: 222 10*3/uL (ref 150–400)
RBC: 3.94 MIL/uL — ABNORMAL LOW (ref 4.22–5.81)
RDW: 17.2 % — ABNORMAL HIGH (ref 11.5–15.5)
WBC: 11.1 10*3/uL — ABNORMAL HIGH (ref 4.0–10.5)
nRBC: 0 % (ref 0.0–0.2)

## 2022-01-21 LAB — ACID FAST CULTURE WITH REFLEXED SENSITIVITIES (MYCOBACTERIA): Acid Fast Culture: NEGATIVE

## 2022-01-21 MED ORDER — SODIUM CHLORIDE 0.9 % IV SOLN
10.0000 mg | Freq: Once | INTRAVENOUS | Status: AC
  Administered 2022-01-21: 10 mg via INTRAVENOUS
  Filled 2022-01-21: qty 10

## 2022-01-21 MED ORDER — ALBUTEROL SULFATE (2.5 MG/3ML) 0.083% IN NEBU
2.5000 mg | INHALATION_SOLUTION | Freq: Once | RESPIRATORY_TRACT | Status: DC | PRN
  Filled 2022-01-21: qty 3

## 2022-01-21 MED ORDER — SODIUM CHLORIDE 0.9 % IV SOLN
Freq: Once | INTRAVENOUS | Status: AC
  Filled 2022-01-21: qty 250

## 2022-01-21 MED ORDER — SODIUM CHLORIDE 0.9% FLUSH
3.0000 mL | Freq: Once | INTRAVENOUS | Status: DC | PRN
  Filled 2022-01-21: qty 3

## 2022-01-21 MED ORDER — SODIUM CHLORIDE 0.9% FLUSH
10.0000 mL | Freq: Once | INTRAVENOUS | Status: DC | PRN
  Filled 2022-01-21: qty 10

## 2022-01-21 MED ORDER — EPINEPHRINE 0.3 MG/0.3ML IJ SOAJ: 0.3000 mg | Freq: Once | INTRAMUSCULAR | Status: DC | PRN

## 2022-01-21 MED ORDER — HEPARIN SOD (PORK) LOCK FLUSH 100 UNIT/ML IV SOLN
500.0000 [IU] | Freq: Once | INTRAVENOUS | Status: AC | PRN
  Administered 2022-01-21: 500 [IU]
  Filled 2022-01-21: qty 5

## 2022-01-21 MED ORDER — ALTEPLASE 2 MG IJ SOLR
2.0000 mg | Freq: Once | INTRAMUSCULAR | Status: DC | PRN
  Filled 2022-01-21: qty 2

## 2022-01-21 MED ORDER — FAMOTIDINE IN NACL 20-0.9 MG/50ML-% IV SOLN: 20.0000 mg | Freq: Once | INTRAVENOUS | Status: DC | PRN

## 2022-01-21 MED ORDER — SODIUM CHLORIDE 0.9 % IV SOLN: 200.0000 mg | Freq: Once | INTRAVENOUS | Status: DC

## 2022-01-21 MED ORDER — SODIUM CHLORIDE 0.9 % IV SOLN
Freq: Once | INTRAVENOUS | Status: DC
  Filled 2022-01-21: qty 250

## 2022-01-21 MED ORDER — DIPHENHYDRAMINE HCL 50 MG/ML IJ SOLN: 50.0000 mg | Freq: Once | INTRAMUSCULAR | Status: DC | PRN

## 2022-01-21 MED ORDER — HEPARIN SOD (PORK) LOCK FLUSH 100 UNIT/ML IV SOLN
250.0000 [IU] | Freq: Once | INTRAVENOUS | Status: DC | PRN
  Filled 2022-01-21: qty 5

## 2022-01-21 MED ORDER — METHYLPREDNISOLONE SODIUM SUCC 125 MG IJ SOLR: 125.0000 mg | Freq: Once | INTRAMUSCULAR | Status: DC | PRN

## 2022-01-21 MED ORDER — HEPARIN SOD (PORK) LOCK FLUSH 100 UNIT/ML IV SOLN
500.0000 [IU] | Freq: Once | INTRAVENOUS | Status: DC | PRN
  Filled 2022-01-21: qty 5

## 2022-01-21 MED ORDER — SODIUM CHLORIDE 0.9 % IV SOLN
Freq: Once | INTRAVENOUS | Status: DC | PRN
  Filled 2022-01-21: qty 250

## 2022-01-21 MED ORDER — SODIUM CHLORIDE 0.9% FLUSH
10.0000 mL | Freq: Once | INTRAVENOUS | Status: AC | PRN
  Administered 2022-01-21: 10 mL
  Filled 2022-01-21: qty 10

## 2022-01-21 NOTE — Progress Notes (Signed)
Rodney Vega ?CONSULT NOTE ? ?Patient Care Team: ?[name], MD as PCP - General (Family Medicine) ?[name], RN as Sales executive ?[name], MD as Consulting Physician (Urology) ?[name], MD as Consulting Physician (Oncology) ? ?CHIEF COMPLAINTS/PURPOSE OF CONSULTATION: lung cancer ? ?  ?Oncology History Overview Note  ?AUG 2022- 8.2 x 5.9 cm spiculated mass noted in right upper lobe consistent ?with malignancy. It extends from the right hilum to the lateral ?chest wall and results in lytic destruction of the lateral portion ?of the right fourth rib.  Mediastinal lymph nodes are noted, including 12 mm right hilar lymph ?node, consistent with metastatic disease. 3 cm right adrenal mass is noted concerning for metastatic disease. ? ?# AUG 2022- PET IMPRESSION: ?Peripherally hypermetabolic right upper lobe mass, likely due to ?central necrosis. Mass extends into the right hilum and right ?lateral chest wall involving the second through fourth right ribs. ?  ?Mildly enlarged and hypermetabolic right hilar lymph node. ?  ?No evidence of metastatic disease in the abdomen or pelvis. ? ?# AUG, 2022-RIGHT CHEST WALL Bx-necrosis; suspicious for malignancy not definitive [discussed with Dr.Kraynie;MDT] ?LUNG MASS, RIGHT; BIOPSY:  ?- SUSPICIOUS FOR MALIGNANCY.  ?- SEE COMMENT.  ? ?Comment:  ?Approximately six tissue cores demonstrate inflamed fibrous capsule with  ?hemosiderin-laden macrophages, in a background of abundant necrosis.  ?One of the cores demonstrates a minute fragment of viable epithelial  ?cells.  These cells are positive for p40.  They are negative for TTF1  ?and CK7. The cells of interest disappear on deeper cut levels.  While  ?the findings are suspicious for squamous cell carcinoma, there is not  ?enough viable tumor for a definitive diagnosis.  Additional tissue  ?sampling may be helpful.  ? ?# SEP 2022-cycle #2 Taxol hypersensitive reaction.  Switched  over to #3 cycle- carbo-Abraxane starting 06/29/2021 ? ?# DEC 2022- s/p ID- Dr.ravishankar- ? Lung abscess-no antibiotics- ? 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 [name] surgery for now] ? ?# S/p evaluation with Dr. [name]. ?  ?Primary cancer of right upper lobe of lung ([place])  ?06/03/2021 Initial Diagnosis  ? Primary cancer of right upper lobe of lung [facility]) ?  ?06/03/2021 Cancer Staging  ? Staging form: Lung, AJCC 8th Edition ?- Clinical: Stage IIIB (cT3, cN2, cM0) - Signed by [name], MD on 06/03/2021 ? ?  ?06/15/2021 - 08/03/2021 Chemotherapy  ? Patient is on Treatment Plan : LUNG Carboplatin / Paclitaxel + XRT q7d  ? ?  ?  ?10/02/2021 - 10/19/2021 Chemotherapy  ? Patient is on Treatment Plan : LUNG Durvalumab q14d  ? ?  ?  ?01/21/2022 -  Chemotherapy  ? Patient is on Treatment Plan : LUNG NSCLC Pembrolizumab (200) q21d  ? ?  ?  ? ? ? ?HISTORY OF PRESENTING ILLNESS: Patient is ambulating today.  Accompanied by wife. ? ?Govani Radloff 66 y.o.  male history of smoking -"lung cancer" [Limited necrotic tissue]-locally advanced unresectable -status post chemoradiation.  Adjuvant  adjuvant IMFINZI is on hold because of multiple complications/admission to hospital. ? ?Patient has not had any hospitalization in the last 1 month.  Patient has been getting IV fluids/dexamethasone in the clinic twice a week. ? ?Overall his appetite improved.  Is gaining weight.  His breathing is overall improved.  Continues to have intermittent cough.  No chest pain.  Continues to have intermittent right chest wall pain/back pain.  No  diarrhea. ? ?Review of Systems  ?Constitutional:  Positive for malaise/fatigue and weight loss. Negative for chills, diaphoresis and fever.  ?HENT:  Negative for nosebleeds and sore throat.   ?Eyes:  Negative for double vision.  ?Respiratory:  Positive for cough, sputum production and shortness of breath. Negative for  hemoptysis and wheezing.   ?Cardiovascular:  Positive for chest pain. Negative for palpitations, orthopnea and leg swelling.  ?Gastrointestinal:  Positive for nausea. Negative for abdominal pain, blood in stool, constipation, diarrhea, heartburn, melena and vomiting.  ?Genitourinary:  Negative for dysuria, frequency and urgency.  ?Musculoskeletal:  Negative for back pain and joint pain.  ?Skin: Negative.  Negative for itching and rash.  ?Neurological:  Positive for tingling. Negative for dizziness, focal weakness, weakness and headaches.  ?Endo/Heme/Allergies:  Does not bruise/bleed easily.  ?Psychiatric/Behavioral:  Negative for depression. The patient is not nervous/anxious and does not have insomnia.    ? ?MEDICAL HISTORY:  ?Past Medical History:  ?Diagnosis Date  ? Cancer [facility])   ? Degenerative arthritis of knee   ? Depression   ? Diabetes mellitus without complication ([place])   ? Diverticulitis   ? Glaucoma   ? since 2004  ? Hernia 1979  ? History of kidney stones   ? Hypertension   ? Personal history of tobacco use, presenting hazards to health   ? Pre-diabetes   ? Screening for obesity   ? Special screening for malignant neoplasms, colon   ? Wears dentures   ? full upper and lower  ? ? ?SURGICAL HISTORY: ?Past Surgical History:  ?Procedure Laterality Date  ? BACK SURGERY  1994, 2012  ? lumbar bulging disc  ? CATARACT EXTRACTION W/PHACO Left 01/20/2021  ? Procedure: CATARACT EXTRACTION PHACO AND INTRAOCULAR LENS PLACEMENT ([place]) LEFT;  Surgeon: Birder Robson, MD;  Location: [place];  Service: Ophthalmology;  Laterality: Left;  10.13 ?0:52.1  ? COLONOSCOPY  [id number]  ? [facility]/Dr. Buelah Manis sessile polyp,48mm, found in rectum,multiple diverticula were found in the sigmoid, in descending and in transverse colon  ? COLONOSCOPY WITH PROPOFOL N/A 10/22/2021  ? Procedure: COLONOSCOPY WITH PROPOFOL;  Surgeon: Annamaria Helling, DO;  Location: [facility] ENDOSCOPY;  Service: Gastroenterology;  Laterality: N/A;  DM   ? COLOSTOMY  04-20-13  ? HERNIA REPAIR  1979  ? inguinal  ? IR IMAGING GUIDED PORT INSERTION  06/10/2021  ? JOINT REPLACEMENT Right   ? knee  ? KNEE ARTHROPLASTY Right 05/08/2018  ? Procedure: COMPUTER ASSISTED TOTAL KNEE ARTHROPLASTY;  Surgeon: Dereck Leep, MD;  Location: [facility] ORS;  Service: Orthopedics;  Laterality: Right;  ? KNEE ARTHROPLASTY Left 11/16/2019  ? Procedure: COMPUTER ASSISTED TOTAL KNEE ARTHROPLASTY;  Surgeon: Dereck Leep, MD;  Location: [facility] ORS;  Service: Orthopedics;  Laterality: Left;  ? LAPAROTOMY  04-20-13  ? colon resection with colosotmy  ? LITHOTRIPSY  2013  ? [place]  ? ? ?SOCIAL HISTORY: ?Social History  ? ?Socioeconomic History  ? Marital status: Married  ?  Spouse name: Not on file  ? Number of children: Not on file  ? Years of education: Not on file  ? Highest education level: Not on file  ?Occupational History  ? Not on file  ?Tobacco Use  ? Smoking status: Every Day  ?  Packs/day: 1.00  ?  Years: 30.00  ?  Pack years: 30.00  ?  Types: Cigarettes  ? Smokeless tobacco: Never  ? Tobacco comments:  ?  since age 39 or 44  ?Vaping  Use  ? Vaping Use: Never used  ?Substance and Sexual Activity  ? Alcohol use: Yes  ?  Alcohol/week: 2.0 standard drinks  ?  Types: 2 Standard drinks or equivalent per week  ?  Comment: 1-2 drinks per week  ? Drug use: Yes  ?  Types: Marijuana  ? Sexual activity: Not on file  ?Other Topics Concern  ? Not on file  ?Social History Narrative  ? 15 mins [place]; smoking; not much alcohol. Worked for [employer], 2019. .   ? ?Social Determinants of Health  ? ?Financial Resource Strain: Not on file  ?Food Insecurity: Not on file  ?Transportation Needs: Not on file  ?Physical Activity: Not on file  ?Stress: Not on file  ?Social Connections: Not on file  ?Intimate Partner Violence: Not on file  ? ? ?FAMILY HISTORY: ?Family History  ?Problem Relation Age of Onset  ? Diabetes Mother   ? Heart disease Mother   ? Heart  attack Father   ? Prostate cancer Father   ? ? ?ALLERGIES:  is allergic to paclitaxel, ambien [zolpidem], nsaids, and aleve [naproxen sodium]. ? ?MEDICATIONS:  ?Current Outpatient Medications  ?Medicatio

## 2022-01-21 NOTE — Progress Notes (Signed)
Pt received 1 L NS + dexamethasone. Pt was given ensure samples per Joli. Discharged to home. VSS. Accompanied by wife. ?

## 2022-01-21 NOTE — Assessment & Plan Note (Addendum)
#  Right upper lobe lung mass -concerning for malignancy [biopsy inconclusive/atypical cells]- Stage III-s/p carbo Abraxane with radiation [finished end of Oct 2022].MARCH 2023-CT scan shows Necrotic mass of the right upper lobe; improvement of the pneumonic process. ? ?#Given the overall improvement of the patient's clinical status-I think is reasonable to consider proceeding with Keytruda.  Patient is a poor candidate for chemoimmunotherapy.  Discussed 20 to 30% response rates with single agent immunotherapy.  Patient is PD-L1 positive [cirucologene].  Patient understands his malignancies incurable. ? ? I discussed the mechanism of action; The goal of therapy is palliative; and length of treatments are likely ongoing/based upon the results of the scans. Discussed the potential side effects of immunotherapy including but not limited to diarrhea; skin rash; elevated LFTs/endocrine abnormalities etc. ? ?#Right chest wall pain-worsened/pleuritic-on Tylenol/tramadol [Qhs-2;1 in AM - 3 a day- worse; recommend fenatny patch 25 mcg.  Also recommend oxycodone 5 mg every 6 hours as needed.  ? ?# COPD/fatigue- [coughing/phlegm/ no fevers]- on albuterol/advair [smoking]- TSH- WNL;Continue  Xopenex/ipratropium nebs. ? 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 Bary Castilla.  Discussed with Dr. Bary Castilla; Hold surgery for now-especially context of his incurable lung malignancy. ? ?# Poorly controlled diabetes-blood sugars 113- 280;  Continue glipizide; and  Metformin. Monitor for now.  ? ?# IV mediport: functioning/STABL ? ?*appt thru mychart  ? ?# DISPOSITION: ?# Continue IVFs-1 lit Tuesdays/Thursday x2  ?# follow up in 2 weeks-MD;- labs- cbc/cmp; Keytruda Dr.B ? ?

## 2022-01-21 NOTE — Progress Notes (Signed)
DISCONTINUE ON PATHWAY REGIMEN - Non-Small Cell Lung ? ? ?  A cycle is every 14 days: ?    Durvalumab  ? ?**Always confirm dose/schedule in your pharmacy ordering system** ? ?REASON: Disease Progression ?PRIOR TREATMENT: [id number]: Durvalumab 10 mg/kg q14 Days x up to 12 Months ?TREATMENT RESPONSE: Progressive Disease (PD) ? ?START OFF PATHWAY REGIMEN - Non-Small Cell Lung ? ? ?[id number]:Pembrolizumab 200 mg IV D1 q21 Days: ?  A cycle is every 21 days: ?    Pembrolizumab  ? ?**Always confirm dose/schedule in your pharmacy ordering system** ? ?Patient Characteristics: ?Stage IV Metastatic, Nonsquamous, Molecular Analysis Completed, Molecular Alteration Present and Targeted Therapy Exhausted OR EGFR Exon 20+ or KRAS G12C+ or HER2+ Present and No Prior Chemo/Immunotherapy OR No Alteration Present, Initial  ?Chemotherapy/Immunotherapy, PS = 0, 1, No Alteration Present, Did Not Order Molecular Analysis/Quantity Not Sufficient for Molecular Analysis ?Therapeutic Status: Stage IV Metastatic ?Histology: Nonsquamous Cell ?Broad Molecular Profiling Status: Molecular Analysis Completed ?Molecular Analysis Results: No Alteration Present ?ECOG Performance Status: 1 ?Chemotherapy/Immunotherapy Line of Therapy: Initial Chemotherapy/Immunotherapy ?EGFR Exons 18-21 Mutation Testing Status: Completed and Negative ?ALK Fusion/Rearrangement Testing Status: Completed and Negative ?BRAF V600 Mutation Testing Status: Quantity Not Sufficient ?KRAS G12C Mutation Testing Status: Quantity Not Sufficient ?MET Exon 14 Mutation Testing Status: Quantity Not Sufficient ?RET Fusion/Rearrangement Testing Status: Quantity Not Sufficient ?HER2 Mutation Testing Status: Quantity Not Sufficient ?NTRK Fusion/Rearrangement Testing Status: Completed and Negative ?ROS1 Fusion/Rearrangement Testing Status: Completed and Negative ?Intent of Therapy: ?Non-Curative / Palliative Intent, Discussed with Patient ?

## 2022-01-21 NOTE — Progress Notes (Signed)
Pt having fatigue and weakness. Lungs sounds have crackles. Pt does cough up phlegm which is thick and yellowish tinge. Occ dyspnea with exertion. Appetite is 75% normal. Drinking a boost at bedtime.B/B WNL's. Pt had a 3lb weight loss this week despite eating. Pain is located in right rib area 2/10 on pain scale at present time. ?

## 2022-01-21 NOTE — Telephone Encounter (Signed)
Pt here today, states when his rx for the levalbuterol .063mg /39ml is sent to pharm it is only for 1 week supply. He would like to get it for a month supply. I spoke with pharm, he states to put rx in as 375 ml to get the 30 day supply. Marya Amsler took verbal on the rx for 1 month supply. The cost is $50/month, relayed this info to his RN Sofie Rower to give him this info. This info changed in meds. ? ? ? ? ?

## 2022-01-22 LAB — ACID FAST SMEAR (AFB, MYCOBACTERIA): Acid Fast Smear: NEGATIVE

## 2022-01-25 ENCOUNTER — Other Ambulatory Visit: Payer: Self-pay | Admitting: *Deleted

## 2022-01-25 MED ORDER — POTASSIUM CHLORIDE CRYS ER 20 MEQ PO TBCR
20.0000 meq | EXTENDED_RELEASE_TABLET | Freq: Every day | ORAL | 0 refills | Status: DC
Start: 2022-01-25 — End: 2022-08-10

## 2022-01-25 NOTE — Telephone Encounter (Signed)
Component Ref Range & Units 4 d ago ?(01/21/22) 6 d ago ?(01/19/22) 11 d ago ?(01/14/22) 13 d ago ?(01/12/22) 2 wk ago ?(01/07/22) 2 wk ago ?(01/05/22) 3 wk ago ?(12/31/21)  ?Potassium 3.5 - 5.1 mmol/L 3.4 Low   3.7  3.2 Low   3.0 Low   3.6  3.5  3.3 Low    ? ?

## 2022-01-26 ENCOUNTER — Inpatient Hospital Stay: Payer: Medicare PPO

## 2022-01-26 VITALS — BP 111/67 | HR 108 | Temp 98.0°F | Resp 18 | Wt 177.1 lb

## 2022-01-26 DIAGNOSIS — C3411 Malignant neoplasm of upper lobe, right bronchus or lung: Secondary | ICD-10-CM

## 2022-01-26 DIAGNOSIS — Z5112 Encounter for antineoplastic immunotherapy: Secondary | ICD-10-CM | POA: Diagnosis not present

## 2022-01-26 DIAGNOSIS — Z95828 Presence of other vascular implants and grafts: Secondary | ICD-10-CM

## 2022-01-26 LAB — CBC WITH DIFFERENTIAL/PLATELET
Abs Immature Granulocytes: 0.07 10*3/uL (ref 0.00–0.07)
Basophils Absolute: 0.1 10*3/uL (ref 0.0–0.1)
Basophils Relative: 1 %
Eosinophils Absolute: 0.2 10*3/uL (ref 0.0–0.5)
Eosinophils Relative: 2 %
HCT: 30.2 % — ABNORMAL LOW (ref 39.0–52.0)
Hemoglobin: 9.6 g/dL — ABNORMAL LOW (ref 13.0–17.0)
Immature Granulocytes: 1 %
Lymphocytes Relative: 9 %
Lymphs Abs: 0.8 10*3/uL (ref 0.7–4.0)
MCH: 28.7 pg (ref 26.0–34.0)
MCHC: 31.8 g/dL (ref 30.0–36.0)
MCV: 90.4 fL (ref 80.0–100.0)
Monocytes Absolute: 0.8 10*3/uL (ref 0.1–1.0)
Monocytes Relative: 8 %
Neutro Abs: 7.6 10*3/uL (ref 1.7–7.7)
Neutrophils Relative %: 79 %
Platelets: 188 10*3/uL (ref 150–400)
RBC: 3.34 MIL/uL — ABNORMAL LOW (ref 4.22–5.81)
RDW: 16.3 % — ABNORMAL HIGH (ref 11.5–15.5)
WBC: 9.5 10*3/uL (ref 4.0–10.5)
nRBC: 0 % (ref 0.0–0.2)

## 2022-01-26 LAB — BASIC METABOLIC PANEL
Anion gap: 5 (ref 5–15)
BUN: 9 mg/dL (ref 8–23)
CO2: 25 mmol/L (ref 22–32)
Calcium: 8.1 mg/dL — ABNORMAL LOW (ref 8.9–10.3)
Chloride: 105 mmol/L (ref 98–111)
Creatinine, Ser: 0.85 mg/dL (ref 0.61–1.24)
GFR, Estimated: >60 mL/min (ref >60)
Glucose, Bld: 137 mg/dL — ABNORMAL HIGH (ref 70–99)
Potassium: 3.6 mmol/L (ref 3.5–5.1)
Sodium: 135 mmol/L (ref 135–145)

## 2022-01-26 MED ORDER — SODIUM CHLORIDE 0.9 % IV SOLN
INTRAVENOUS | Status: AC
  Filled 2022-01-26 (×2): qty 250

## 2022-01-26 MED ORDER — HEPARIN SOD (PORK) LOCK FLUSH 100 UNIT/ML IV SOLN
500.0000 [IU] | Freq: Once | INTRAVENOUS | Status: AC
  Administered 2022-01-26: 500 [IU] via INTRAVENOUS
  Filled 2022-01-26: qty 5

## 2022-01-26 MED ORDER — SODIUM CHLORIDE 0.9% FLUSH
10.0000 mL | Freq: Once | INTRAVENOUS | Status: AC
  Administered 2022-01-26: 10 mL via INTRAVENOUS
  Filled 2022-01-26: qty 10

## 2022-01-26 NOTE — Progress Notes (Signed)
Nutrition Follow-up: ? ?Patient with lung cancer.  Unable to tolerate imfinzi.  Planning to start [place].   ? ?Met with patient and wife in clinic while getting fluids vs telephone visit.  Patient reports appetite continues to be up and down.  Taste changes daily.  Says that sesame chicken is tasting good.  Finally ate some sausage that had some taste this morning. Usually eats 2 double yolk eggs, sausage and 2 pieces of toast each morning.  Typically skips lunch and eats supper.  Wife prepares meals for him.  Drinks boost VHC shake every night and wife sometimes mixes ice cream in the shake.   ? ? ? ?Medications: reviewed ? ?Labs: reviewed ? ?Anthropometrics:  ? ?Weight 177 lb 2 oz ? ?180 lb 1.6 oz on 4/18 ?174 lb on 3/30 ?169 lb on 3/14 ?188 lb on 3/2 ? ? ?NUTRITION DIAGNOSIS: Inadequate oral intake stable ? ? ?INTERVENTION:  ?Continue boost VHC and samples of ensure enlive given to patient along with coupons. ?Continue high calorie, high protein foods ?  ? ?MONITORING, EVALUATION, GOAL: weight trends, intake ? ? ?NEXT VISIT: to be determined after starting new treatment ? ?Kymoni Monday B. Zenia Resides, RD, LDN ?Registered Dietitian ?[phone] ? ? ?

## 2022-01-27 ENCOUNTER — Telehealth: Payer: Self-pay | Admitting: *Deleted

## 2022-01-27 NOTE — Telephone Encounter (Signed)
Peggy called reporting that patient is experiencing sever pain today, He has his Fentanyl patch on and has taken Oxycodone early this morning and at lunch and it is NOT touching the pain. She is asking what to do for his pain. Please advise ?

## 2022-01-28 ENCOUNTER — Encounter: Payer: Self-pay | Admitting: Internal Medicine

## 2022-01-28 ENCOUNTER — Other Ambulatory Visit: Payer: Self-pay | Admitting: *Deleted

## 2022-01-28 ENCOUNTER — Inpatient Hospital Stay: Payer: Medicare PPO

## 2022-01-28 VITALS — BP 109/58 | HR 90 | Temp 98.0°F | Resp 18 | Wt 179.7 lb

## 2022-01-28 DIAGNOSIS — Z5112 Encounter for antineoplastic immunotherapy: Secondary | ICD-10-CM | POA: Diagnosis not present

## 2022-01-28 DIAGNOSIS — C3411 Malignant neoplasm of upper lobe, right bronchus or lung: Secondary | ICD-10-CM

## 2022-01-28 MED ORDER — SODIUM CHLORIDE 0.9 % IV SOLN
10.0000 mg | Freq: Once | INTRAVENOUS | Status: AC
  Administered 2022-01-28: 10 mg via INTRAVENOUS
  Filled 2022-01-28: qty 10

## 2022-01-28 MED ORDER — SODIUM CHLORIDE 0.9 % IV SOLN
Freq: Once | INTRAVENOUS | Status: AC
  Filled 2022-01-28: qty 250

## 2022-01-28 MED ORDER — OXYCODONE HCL 5 MG PO TABS: 5.0000 mg | ORAL_TABLET | Freq: Four times a day (QID) | ORAL | 0 refills | Status: DC | PRN

## 2022-01-28 MED ORDER — HEPARIN SOD (PORK) LOCK FLUSH 100 UNIT/ML IV SOLN
500.0000 [IU] | Freq: Once | INTRAVENOUS | Status: AC | PRN
  Administered 2022-01-28: 500 [IU]
  Filled 2022-01-28: qty 5

## 2022-01-28 MED ORDER — SODIUM CHLORIDE 0.9% FLUSH
10.0000 mL | Freq: Once | INTRAVENOUS | Status: AC | PRN
  Administered 2022-01-28: 10 mL
  Filled 2022-01-28: qty 10

## 2022-01-28 NOTE — Telephone Encounter (Signed)
Pt's pain was much better in Fluid clinic today. Wife did give him 1 pain pill before coming into clinic and another one after they got home. Patient is resting comfortably in his recliner. Wife needed reassurance that it is okay to need breakthrough pain medication along with long acting fentanyl patch. OK to give pt 2 oxycodone if his pain seems really bad.A refill oxycodone script was sent to pharmacy today as well. ?

## 2022-01-28 NOTE — Progress Notes (Signed)
Pharmacist Chemotherapy Monitoring - Initial Assessment   ? ?Anticipated start date: 02/04/22  ? ?The following has been reviewed per standard work regarding the patient's treatment regimen: ?The patient's diagnosis, treatment plan and drug doses, and organ/hematologic function ?Lab orders and baseline tests specific to treatment regimen  ?The treatment plan start date, drug sequencing, and pre-medications ?Prior authorization status  ?Patient's documented medication list, including drug-drug interaction screen and prescriptions for anti-emetics and supportive care specific to the treatment regimen ?The drug concentrations, fluid compatibility, administration routes, and timing of the medications to be used ?The patient's access for treatment and lifetime cumulative dose history, if applicable  ?The patient's medication allergies and previous infusion related reactions, if applicable  ? ?Changes made to treatment plan:  ?treatment plan date ? ?Follow up needed:  ?signing treatment plan ? ? ?Adelina Mings, [facility], ?01/28/2022  9:44 AM  ?

## 2022-01-28 NOTE — Telephone Encounter (Signed)
Judeen Hammans, will you f/u to see if pt needs to be seen in [facility]? ?

## 2022-01-30 ENCOUNTER — Other Ambulatory Visit: Payer: Self-pay | Admitting: Internal Medicine

## 2022-02-02 ENCOUNTER — Other Ambulatory Visit: Payer: Self-pay

## 2022-02-02 ENCOUNTER — Telehealth: Payer: Self-pay

## 2022-02-02 ENCOUNTER — Inpatient Hospital Stay: Payer: Medicare PPO | Attending: Internal Medicine

## 2022-02-02 VITALS — BP 119/60 | HR 109 | Temp 96.3°F | Resp 17

## 2022-02-02 DIAGNOSIS — J449 Chronic obstructive pulmonary disease, unspecified: Secondary | ICD-10-CM | POA: Insufficient documentation

## 2022-02-02 DIAGNOSIS — Z87442 Personal history of urinary calculi: Secondary | ICD-10-CM | POA: Diagnosis not present

## 2022-02-02 DIAGNOSIS — C3411 Malignant neoplasm of upper lobe, right bronchus or lung: Secondary | ICD-10-CM | POA: Insufficient documentation

## 2022-02-02 DIAGNOSIS — R5383 Other fatigue: Secondary | ICD-10-CM | POA: Insufficient documentation

## 2022-02-02 DIAGNOSIS — F17210 Nicotine dependence, cigarettes, uncomplicated: Secondary | ICD-10-CM | POA: Insufficient documentation

## 2022-02-02 DIAGNOSIS — R531 Weakness: Secondary | ICD-10-CM | POA: Insufficient documentation

## 2022-02-02 DIAGNOSIS — E1165 Type 2 diabetes mellitus with hyperglycemia: Secondary | ICD-10-CM | POA: Insufficient documentation

## 2022-02-02 DIAGNOSIS — E278 Other specified disorders of adrenal gland: Secondary | ICD-10-CM | POA: Diagnosis not present

## 2022-02-02 DIAGNOSIS — I10 Essential (primary) hypertension: Secondary | ICD-10-CM | POA: Diagnosis not present

## 2022-02-02 DIAGNOSIS — Z79899 Other long term (current) drug therapy: Secondary | ICD-10-CM | POA: Diagnosis not present

## 2022-02-02 DIAGNOSIS — Z5112 Encounter for antineoplastic immunotherapy: Secondary | ICD-10-CM | POA: Insufficient documentation

## 2022-02-02 DIAGNOSIS — Z8042 Family history of malignant neoplasm of prostate: Secondary | ICD-10-CM | POA: Diagnosis not present

## 2022-02-02 MED ORDER — HEPARIN SOD (PORK) LOCK FLUSH 100 UNIT/ML IV SOLN
500.0000 [IU] | Freq: Once | INTRAVENOUS | Status: AC | PRN
  Administered 2022-02-02: 500 [IU]
  Filled 2022-02-02: qty 5

## 2022-02-02 MED ORDER — SODIUM CHLORIDE 0.9% FLUSH
10.0000 mL | Freq: Once | INTRAVENOUS | Status: AC | PRN
  Administered 2022-02-02: 10 mL
  Filled 2022-02-02: qty 10

## 2022-02-02 MED ORDER — ESZOPICLONE 1 MG PO TABS: 1.0000 mg | ORAL_TABLET | Freq: Every evening | ORAL | 0 refills | Status: DC | PRN

## 2022-02-02 MED ORDER — SODIUM CHLORIDE 0.9 % IV SOLN
10.0000 mg | Freq: Once | INTRAVENOUS | Status: AC
  Administered 2022-02-02: 10 mg via INTRAVENOUS
  Filled 2022-02-02: qty 10

## 2022-02-02 MED ORDER — SODIUM CHLORIDE 0.9 % IV SOLN
Freq: Once | INTRAVENOUS | Status: AC
  Filled 2022-02-02: qty 250

## 2022-02-02 MED ORDER — FENTANYL 25 MCG/HR TD PT72: 1.0000 | MEDICATED_PATCH | TRANSDERMAL | 0 refills | Status: DC

## 2022-02-02 NOTE — Progress Notes (Signed)
Patient tolerated 1L IVF infusion well along with 10mg  decadron IVPB. No questions/concerns voiced. Patient stable at discharge. AVS given.   ?

## 2022-02-02 NOTE — Telephone Encounter (Signed)
Original refill printed.  Pended again for MD to send to pharmacy via e-scribe. ?

## 2022-02-02 NOTE — Patient Instructions (Signed)
[facility]  Discharge Instructions: ?Thank you for choosing [facility] to provide your oncology and hematology care.  ?If you have a lab appointment with the Cancer Center, please go directly to the Cancer Center and check in at the registration area. ? ?Wear comfortable clothing and clothing appropriate for easy access to any Portacath or PICC line.  ? ?We strive to give you quality time with your provider. You may need to reschedule your appointment if you arrive late (15 or more minutes).  Arriving late affects you and other patients whose appointments are after yours.  Also, if you miss three or more appointments without notifying the office, you may be dismissed from the clinic at the provider?s discretion.    ?  ?For prescription refill requests, have your pharmacy contact our office and allow 72 hours for refills to be completed.   ? ?  ?To help prevent nausea and vomiting after your treatment, we encourage you to take your nausea medication as directed. ? ?BELOW ARE SYMPTOMS THAT SHOULD BE REPORTED IMMEDIATELY: ?*FEVER GREATER THAN 100.4 F (38 ?C) OR HIGHER ?*CHILLS OR SWEATING ?*NAUSEA AND VOMITING THAT IS NOT CONTROLLED WITH YOUR NAUSEA MEDICATION ?*UNUSUAL SHORTNESS OF BREATH ?*UNUSUAL BRUISING OR BLEEDING ?*URINARY PROBLEMS (pain or burning when urinating, or frequent urination) ?*BOWEL PROBLEMS (unusual diarrhea, constipation, pain near the anus) ?TENDERNESS IN MOUTH AND THROAT WITH OR WITHOUT PRESENCE OF ULCERS (sore throat, sores in mouth, or a toothache) ?UNUSUAL RASH, SWELLING OR PAIN  ?UNUSUAL VAGINAL DISCHARGE OR ITCHING  ? ?Items with * indicate a potential emergency and should be followed up as soon as possible or go to the Emergency Department if any problems should occur. ? ?Please show the CHEMOTHERAPY ALERT CARD or IMMUNOTHERAPY ALERT CARD at check-in to the Emergency Department and triage nurse. ? ?Should you have questions after your visit  or need to cancel or reschedule your appointment, please contact [facility]  [phone] and follow the prompts.  Office hours are 8:00 a.m. to 4:30 p.m. Monday - Friday. Please note that voicemails left after 4:00 p.m. may not be returned until the following business day.  We are closed weekends and major holidays. You have access to a nurse at all times for urgent questions. Please call the main number to the clinic [phone] and follow the prompts. ? ?For any non-urgent questions, you may also contact your provider using MyChart. We now offer e-Visits for anyone 18 and older to request care online for non-urgent symptoms. For details visit [email or web address]. ?  ?Also download the MyChart app! Go to the app store, search "MyChart", open the app, select [facility], and log in with your MyChart username and password. ? ?Due to Covid, a mask is required upon entering the hospital/clinic. If you do not have a mask, one will be given to you upon arrival. For doctor visits, patients may have 1 support person aged 18 or older with them. For treatment visits, patients cannot have anyone with them due to current Covid guidelines and our immunocompromised population.  ?

## 2022-02-02 NOTE — Progress Notes (Signed)
error 

## 2022-02-02 NOTE — Telephone Encounter (Signed)
Patient here at fluid clinic asked for advir inhaler refill. Dr. Rogue Bussing notified, requests patient to f/u with Pulmonary, Dr. Lanney Gins. Called office patient needs to be re-established. Faxed over documents. Office will reach out to patient to schedule/refill medication. Patient aware. ?

## 2022-02-02 NOTE — Progress Notes (Signed)
Original refill printed.  Pended again for MD to send to pharmacy via e-scribe. ?

## 2022-02-03 ENCOUNTER — Encounter: Payer: Self-pay | Admitting: Radiation Oncology

## 2022-02-03 ENCOUNTER — Ambulatory Visit
Admission: RE | Admit: 2022-02-03 | Discharge: 2022-02-03 | Disposition: A | Discharge status: Home / Self Care | Payer: Medicare PPO | Source: Ambulatory Visit | Attending: Radiation Oncology | Admitting: Radiation Oncology

## 2022-02-03 ENCOUNTER — Encounter: Payer: Self-pay | Admitting: Internal Medicine

## 2022-02-03 VITALS — BP 125/72 | HR 98 | Temp 96.8°F | Resp 20

## 2022-02-03 DIAGNOSIS — Z9221 Personal history of antineoplastic chemotherapy: Secondary | ICD-10-CM | POA: Insufficient documentation

## 2022-02-03 DIAGNOSIS — Z923 Personal history of irradiation: Secondary | ICD-10-CM | POA: Diagnosis not present

## 2022-02-03 DIAGNOSIS — C3411 Malignant neoplasm of upper lobe, right bronchus or lung: Secondary | ICD-10-CM | POA: Diagnosis not present

## 2022-02-03 DIAGNOSIS — R079 Chest pain, unspecified: Secondary | ICD-10-CM | POA: Diagnosis not present

## 2022-02-03 MED ORDER — ESZOPICLONE 1 MG PO TABS: 1.0000 mg | ORAL_TABLET | Freq: Every evening | ORAL | 0 refills | Status: DC | PRN

## 2022-02-03 NOTE — Progress Notes (Signed)
Radiation Oncology ?Follow up Note ? ?Name: Rodney Vega   ?Date:   02/03/2022 ?MRN:  [id number] ?DOB: 01/03/1956  ? ? ?This 66 y.o. male presents to the clinic today for 6-month follow-up status post concurrent chemoradiation therapy for stage IIIa (T4 N1 M0) non-small cell lung cancer of the right lung favoring squamous cell carcinoma. ? ?REFERRING PROVIDER: Hedrick, James, MD ? 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  Seen today in routine follow-up he is doing fairly well.  He still has some right-sided chest pain..  He did initially have involvement of the right fourth rib with lytic destruction which could be the cause of his right chest pain.  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  He is having no GI symptoms at this time.  He has a mild cough no hemoptysis or chest tightness most recent CT scan of his chest showed marked improvement of centrilobular nodularity within the left lower lobe right lower lobe right middle lobe findings suggest improved infectious process.  He is being treated with Keytruda as patient is PD-L1 positive.  He is currently under observation by both Dr. Burnett and medical oncology for his cecal mass given the prognosis for his lung cancer. ? ?COMPLICATIONS OF TREATMENT: none ? ?FOLLOW UP COMPLIANCE: keeps appointments  ? ?PHYSICAL EXAM:  ?BP 125/72   Pulse 98   Temp (!) 96.8 ?F (36 ?C)   Resp 20  ?Wheelchair-bound male in NAD.  Well-developed well-nourished patient in NAD. HEENT reveals PERLA, EOMI, discs not visualized.  Oral cavity is clear. No oral mucosal lesions are identified. Neck is clear without evidence of cervical or supraclavicular adenopathy. Lungs are clear to A&P. Cardiac examination is essentially unremarkable with regular rate and rhythm without murmur rub or thrill.  Abdomen is benign with no organomegaly or masses noted. Motor sensory and DTR levels are equal and symmetric in the upper and lower extremities. Cranial nerves II through XII are grossly intact. Proprioception is intact. No peripheral adenopathy or edema is identified. No motor or sensory levels are noted. Crude visual fields are within normal range. ? ?RADIOLOGY RESULTS: CT scans chest abdomen pelvis all reviewed compatible with above-stated findings ? ?PLAN: Present time patient is currently on immunotherapy with Keytruda under medical oncology's direction.  Overall he is clinically stable.  Chest CT shows improvement in the lung.  Both Dr. Burnett and medical oncology are observing his cecal mass which if he stabilizes may be amenable to resection.  I have asked to see him back in 6 months for follow-up.  This was all discussed with the patient and his wife.  Patient knows to call with any concerns. ? ?I would like to take this opportunity to thank you for allowing me to participate in the care of your patient.. ? ? ?  ? Glenn Chrystal, MD ? ?

## 2022-02-04 ENCOUNTER — Inpatient Hospital Stay (HOSPITAL_BASED_OUTPATIENT_CLINIC_OR_DEPARTMENT_OTHER): Payer: Medicare PPO | Admitting: Internal Medicine

## 2022-02-04 ENCOUNTER — Ambulatory Visit: Payer: Medicare PPO

## 2022-02-04 ENCOUNTER — Encounter: Payer: Self-pay | Admitting: Internal Medicine

## 2022-02-04 ENCOUNTER — Inpatient Hospital Stay: Payer: Medicare PPO

## 2022-02-04 VITALS — BP 120/63 | HR 98 | Temp 98.0°F | Ht 71.0 in | Wt 152.0 lb

## 2022-02-04 DIAGNOSIS — R531 Weakness: Secondary | ICD-10-CM | POA: Diagnosis not present

## 2022-02-04 DIAGNOSIS — C3411 Malignant neoplasm of upper lobe, right bronchus or lung: Secondary | ICD-10-CM

## 2022-02-04 DIAGNOSIS — Z5112 Encounter for antineoplastic immunotherapy: Secondary | ICD-10-CM | POA: Diagnosis not present

## 2022-02-04 LAB — CBC WITH DIFFERENTIAL/PLATELET
Abs Immature Granulocytes: 0.07 10*3/uL (ref 0.00–0.07)
Basophils Absolute: 0.1 10*3/uL (ref 0.0–0.1)
Basophils Relative: 1 %
Eosinophils Absolute: 0.4 10*3/uL (ref 0.0–0.5)
Eosinophils Relative: 4 %
HCT: 31.7 % — ABNORMAL LOW (ref 39.0–52.0)
Hemoglobin: 10 g/dL — ABNORMAL LOW (ref 13.0–17.0)
Immature Granulocytes: 1 %
Lymphocytes Relative: 12 %
Lymphs Abs: 1.1 10*3/uL (ref 0.7–4.0)
MCH: 28.9 pg (ref 26.0–34.0)
MCHC: 31.5 g/dL (ref 30.0–36.0)
MCV: 91.6 fL (ref 80.0–100.0)
Monocytes Absolute: 0.6 10*3/uL (ref 0.1–1.0)
Monocytes Relative: 6 %
Neutro Abs: 7.3 10*3/uL (ref 1.7–7.7)
Neutrophils Relative %: 76 %
Platelets: 240 10*3/uL (ref 150–400)
RBC: 3.46 MIL/uL — ABNORMAL LOW (ref 4.22–5.81)
RDW: 15.7 % — ABNORMAL HIGH (ref 11.5–15.5)
WBC: 9.4 10*3/uL (ref 4.0–10.5)
nRBC: 0 % (ref 0.0–0.2)

## 2022-02-04 LAB — COMPREHENSIVE METABOLIC PANEL
ALT: 13 U/L (ref 0–44)
AST: 21 U/L (ref 15–41)
Albumin: 2.9 g/dL — ABNORMAL LOW (ref 3.5–5.0)
Alkaline Phosphatase: 90 U/L (ref 38–126)
Anion gap: 9 (ref 5–15)
BUN: 10 mg/dL (ref 8–23)
CO2: 26 mmol/L (ref 22–32)
Calcium: 8.7 mg/dL — ABNORMAL LOW (ref 8.9–10.3)
Chloride: 100 mmol/L (ref 98–111)
Creatinine, Ser: 0.82 mg/dL (ref 0.61–1.24)
GFR, Estimated: >60 mL/min (ref >60)
Glucose, Bld: 244 mg/dL — ABNORMAL HIGH (ref 70–99)
Potassium: 3.6 mmol/L (ref 3.5–5.1)
Sodium: 135 mmol/L (ref 135–145)
Total Bilirubin: 0.4 mg/dL (ref 0.3–1.2)
Total Protein: 6.9 g/dL (ref 6.5–8.1)

## 2022-02-04 MED ORDER — SODIUM CHLORIDE 0.9 % IV SOLN
Freq: Once | INTRAVENOUS | Status: AC
  Filled 2022-02-04: qty 250

## 2022-02-04 MED ORDER — SODIUM CHLORIDE 0.9 % IV SOLN
200.0000 mg | Freq: Once | INTRAVENOUS | Status: AC
  Administered 2022-02-04: 200 mg via INTRAVENOUS
  Filled 2022-02-04: qty 200

## 2022-02-04 MED ORDER — HEPARIN SOD (PORK) LOCK FLUSH 100 UNIT/ML IV SOLN
500.0000 [IU] | Freq: Once | INTRAVENOUS | Status: AC | PRN
  Administered 2022-02-04: 500 [IU]
  Filled 2022-02-04: qty 5

## 2022-02-04 NOTE — Patient Instructions (Signed)
[facility]  Discharge Instructions: ?Thank you for choosing [facility] to provide your oncology and hematology care.  ?If you have a lab appointment with the Cancer Center, please go directly to the Cancer Center and check in at the registration area. ? ?Wear comfortable clothing and clothing appropriate for easy access to any Portacath or PICC line.  ? ?We strive to give you quality time with your provider. You may need to reschedule your appointment if you arrive late (15 or more minutes).  Arriving late affects you and other patients whose appointments are after yours.  Also, if you miss three or more appointments without notifying the office, you may be dismissed from the clinic at the provider?s discretion.    ?  ?For prescription refill requests, have your pharmacy contact our office and allow 72 hours for refills to be completed.   ? ?  ?To help prevent nausea and vomiting after your treatment, we encourage you to take your nausea medication as directed. ? ?BELOW ARE SYMPTOMS THAT SHOULD BE REPORTED IMMEDIATELY: ?*FEVER GREATER THAN 100.4 F (38 ?C) OR HIGHER ?*CHILLS OR SWEATING ?*NAUSEA AND VOMITING THAT IS NOT CONTROLLED WITH YOUR NAUSEA MEDICATION ?*UNUSUAL SHORTNESS OF BREATH ?*UNUSUAL BRUISING OR BLEEDING ?*URINARY PROBLEMS (pain or burning when urinating, or frequent urination) ?*BOWEL PROBLEMS (unusual diarrhea, constipation, pain near the anus) ?TENDERNESS IN MOUTH AND THROAT WITH OR WITHOUT PRESENCE OF ULCERS (sore throat, sores in mouth, or a toothache) ?UNUSUAL RASH, SWELLING OR PAIN  ?UNUSUAL VAGINAL DISCHARGE OR ITCHING  ? ?Items with * indicate a potential emergency and should be followed up as soon as possible or go to the Emergency Department if any problems should occur. ? ?Please show the CHEMOTHERAPY ALERT CARD or IMMUNOTHERAPY ALERT CARD at check-in to the Emergency Department and triage nurse. ? ?Should you have questions after your visit  or need to cancel or reschedule your appointment, please contact [facility]  [phone] and follow the prompts.  Office hours are 8:00 a.m. to 4:30 p.m. Monday - Friday. Please note that voicemails left after 4:00 p.m. may not be returned until the following business day.  We are closed weekends and major holidays. You have access to a nurse at all times for urgent questions. Please call the main number to the clinic [phone] and follow the prompts. ? ?For any non-urgent questions, you may also contact your provider using MyChart. We now offer e-Visits for anyone 18 and older to request care online for non-urgent symptoms. For details visit [email or web address]. ?  ?Also download the MyChart app! Go to the app store, search "MyChart", open the app, select [facility], and log in with your MyChart username and password. ? ?Due to Covid, a mask is required upon entering the hospital/clinic. If you do not have a mask, one will be given to you upon arrival. For doctor visits, patients may have 1 support person aged 18 or older with them. For treatment visits, patients cannot have anyone with them due to current Covid guidelines and our immunocompromised population.  ?

## 2022-02-04 NOTE — Progress Notes (Signed)
Pt would like to get iv fluids today if possible. ? ?C/o right side/flank pain,and right hand shaking. ? ?Redness/patch center lower back. ?

## 2022-02-04 NOTE — Assessment & Plan Note (Addendum)
#  UNRESECTABLE/LOCALLY ADVANCED- Right upper lobe lung mass -concerning for malignancy [biopsy inconclusive/atypical cells]-   carbo Abraxane with radiation [finished end of Oct 2022]. MARCH 2023-CT scan shows Necrotic mass of the right upper lobe; improvement of the pneumonic process. April 2023- PD-L1 positive [cirucologene]. ? ?# Given the overall improvement of the patient's clinical status-I think is reasonable to consider proceeding with Keytruda.  Patient is a poor candidate for chemoimmunotherapy.  ? ?# Proceed with [place];Labs today reviewed;  acceptable for treatment today.  ? ? ?# I discussed the mechanism of action; The goal of therapy is palliative; and length of treatments are likely ongoing/based upon the results of the scans. Discussed the potential side effects of immunotherapy including but not limited to diarrhea; skin rash; elevated LFTs/endocrine abnormalities etc. I again reviewed the interaction with steroids; and hence would limit dexamethasone.  We will continue IV fluids for patient's overall hydration. ? ?#Right chest wall pain-/pleuritic- ON  fenatny patch 25 mcg.  Oxycodone 5 mg every 6 hours as needed [3-4 a day] ? ?# COPD/fatigue- [coughing/phlegm/ no fevers]- on albuterol/advair [smoking]- TSH- WNL;Continue  Xopenex/ipratropium nebs- STABLE.  ? ?# Tremors/ gait instability: STAT MRI Brain.  ? 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 Bary Castilla. Hold surgery for now-especially context of his incurable lung malignancy. STABLE.  ? ?# Poorly controlled diabetes-blood sugars 113- 280;  Continue glipizide; and  Metformin. Monitor for now.  ? ?# IV mediport: functioning/STABLE ? ?*appt thru mychart  ? ?# DISPOSITION: ?# MRI brain STAT ?# Keytruda today; ALSO- add IVFs over 1 hour.  ?# in 1 week- IVFs-1 lit /1 hourTuesdays/Thursday  ?# in 2 weeks-  IVFs-1 lit /1 hourTuesdays/Thursday  ?# follow up in 3  weeks-MD;- labs- cbc/cmp; Keytruda Dr.B ? ?

## 2022-02-04 NOTE — Progress Notes (Signed)
[facility] ?CONSULT NOTE ? ?Patient Care Team: ?[name], MD as PCP - General (Family Medicine) ?[name], RN as Sales executive ?[name], MD as Consulting Physician (Urology) ?[name], MD as Consulting Physician (Oncology) ? ?CHIEF COMPLAINTS/PURPOSE OF CONSULTATION: lung cancer ? ?  ?Oncology History Overview Note  ?AUG 2022- 8.2 x 5.9 cm spiculated mass noted in right upper lobe consistent ?with malignancy. It extends from the right hilum to the lateral ?chest wall and results in lytic destruction of the lateral portion ?of the right fourth rib.  Mediastinal lymph nodes are noted, including 12 mm right hilar lymph ?node, consistent with metastatic disease. 3 cm right adrenal mass is noted concerning for metastatic disease. ? ?# AUG 2022- PET IMPRESSION: ?Peripherally hypermetabolic right upper lobe mass, likely due to ?central necrosis. Mass extends into the right hilum and right ?lateral chest wall involving the second through fourth right ribs. ?  ?Mildly enlarged and hypermetabolic right hilar lymph node. ?  ?No evidence of metastatic disease in the abdomen or pelvis. ? ?# AUG, 2022-RIGHT CHEST WALL Bx-necrosis; suspicious for malignancy not definitive [discussed with Dr.Kraynie;MDT] ?LUNG MASS, RIGHT; BIOPSY:  ?- SUSPICIOUS FOR MALIGNANCY.  ?- SEE COMMENT.  ? ?Comment:  ?Approximately six tissue cores demonstrate inflamed fibrous capsule with  ?hemosiderin-laden macrophages, in a background of abundant necrosis.  ?One of the cores demonstrates a minute fragment of viable epithelial  ?cells.  These cells are positive for p40.  They are negative for TTF1  ?and CK7. The cells of interest disappear on deeper cut levels.  While  ?the findings are suspicious for squamous cell carcinoma, there is not  ?enough viable tumor for a definitive diagnosis.  Additional tissue  ?sampling may be helpful.  ? ?# SEP 2022-cycle #2 Taxol hypersensitive reaction.  Switched  over to #3 cycle- carbo-Abraxane starting 06/29/2021 ? ?# DEC 2022- s/p ID- Dr.ravishankar- ? Lung abscess-no antibiotics- ? 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 [name] surgery for now]  ? ?# S/p evaluation with Dr. [name]. ?  ?Primary cancer of right upper lobe of lung ([place])  ?06/03/2021 Initial Diagnosis  ? Primary cancer of right upper lobe of lung [facility]) ?  ?06/03/2021 Cancer Staging  ? Staging form: Lung, AJCC 8th Edition ?- Clinical: Stage IIIB (cT3, cN2, cM0) - Signed by [name], MD on 06/03/2021 ? ?  ?06/15/2021 - 08/03/2021 Chemotherapy  ? Patient is on Treatment Plan : LUNG Carboplatin / Paclitaxel + XRT q7d  ? ?  ?  ?10/02/2021 - 10/19/2021 Chemotherapy  ? Patient is on Treatment Plan : LUNG Durvalumab q14d  ? ?  ?  ?02/04/2022 -  Chemotherapy  ? Patient is on Treatment Plan : LUNG NSCLC Pembrolizumab (200) q21d  ? ?  ?  ? ? ? ?HISTORY OF PRESENTING ILLNESS: Patient is ambulating in a wheelchair.  Accompanied by wife. ? ?Rodney Vega 66 y.o.  male history of smoking -"lung cancer" [Limited necrotic tissue]-locally advanced unresectable -status post chemoradiation.  Adjuvant  adjuvant IMFINZI was discontinued because of multiple complications/admission to hospital.  Patient is here to proceed with [facility]. ? ?Patient has not had any hospitalizations the last 1 to 2 months.  He has been getting fluids steroids in the clinic twice a week. ? ?Patient noted to have worsening tremors of his right upper extremity more than the left.  Dizzy spells.  Mild intermittent headaches.  Also had  falls.  No loss of consciousness. ? ?He continues to have intermittent chest wall pain currently improved on narcotic pain medication. ? ?Overall his appetite improved.  Is gaining weight.  His breathing is overall improved.  Continues to have intermittent cough.   ? ? ?Review of Systems  ?Constitutional:  Positive for malaise/fatigue  and weight loss. Negative for chills, diaphoresis and fever.  ?HENT:  Negative for nosebleeds and sore throat.   ?Eyes:  Negative for double vision.  ?Respiratory:  Positive for cough, sputum production and shortness of breath. Negative for hemoptysis and wheezing.   ?Cardiovascular:  Positive for chest pain. Negative for palpitations, orthopnea and leg swelling.  ?Gastrointestinal:  Positive for nausea. Negative for abdominal pain, blood in stool, constipation, diarrhea, heartburn, melena and vomiting.  ?Genitourinary:  Negative for dysuria, frequency and urgency.  ?Musculoskeletal:  Negative for back pain and joint pain.  ?Skin: Negative.  Negative for itching and rash.  ?Neurological:  Positive for tingling. Negative for dizziness, focal weakness, weakness and headaches.  ?Endo/Heme/Allergies:  Does not bruise/bleed easily.  ?Psychiatric/Behavioral:  Negative for depression. The patient is not nervous/anxious and does not have insomnia.    ? ?MEDICAL HISTORY:  ?Past Medical History:  ?Diagnosis Date  ? Cancer [facility])   ? Degenerative arthritis of knee   ? Depression   ? Diabetes mellitus without complication ([place])   ? Diverticulitis   ? Glaucoma   ? since 2004  ? Hernia 1979  ? History of kidney stones   ? Hypertension   ? Personal history of tobacco use, presenting hazards to health   ? Pre-diabetes   ? Screening for obesity   ? Special screening for malignant neoplasms, colon   ? Wears dentures   ? full upper and lower  ? ? ?SURGICAL HISTORY: ?Past Surgical History:  ?Procedure Laterality Date  ? BACK SURGERY  1994, 2012  ? lumbar bulging disc  ? CATARACT EXTRACTION W/PHACO Left 01/20/2021  ? Procedure: CATARACT EXTRACTION PHACO AND INTRAOCULAR LENS PLACEMENT ([place]) LEFT;  Surgeon: Birder Robson, MD;  Location: [facility];  Service: Ophthalmology;  Laterality: Left;  10.13 ?0:52.1  ? COLONOSCOPY  [id number]  ? [facility]/Dr. Buelah Manis sessile polyp,91mm, found in rectum,multiple diverticula were found in the  sigmoid, in descending and in transverse colon  ? COLONOSCOPY WITH PROPOFOL N/A 10/22/2021  ? Procedure: COLONOSCOPY WITH PROPOFOL;  Surgeon: Annamaria Helling, DO;  Location[facility] END[facility];  Service: Gastroenterology;  Laterality: N/A;  DM  ? COLOSTOMY  04-20-13  ? HERNIA REPAIR  1979  ? inguinal  ? IR IMAGING GUIDED PORT INSERTION  06/10/2021  ? JOINT REPLACEMENT Right   ? knee  ? KNEE ARTHROPLASTY Right 05/08/2018  ? Procedure: COMPUTER ASSISTED TOTAL KNEE ARTHROPLASTY;  Surgeon: Dereck Leep, MD;  Location: [facility] ORS;  Service: Orthopedics;  Laterality: Right;  ? KNEE ARTHROPLASTY Left 11/16/2019  ? Procedure: COMPUTER ASSISTED TOTAL KNEE ARTHROPLASTY;  Surgeon: Dereck Leep, MD;  Location: [facility] ORS;  Service: Orthopedics;  Laterality: Left;  ? LAPAROTOMY  04-20-13  ? colon resection with colosotmy  ? LITHOTRIPSY  2013  ? [place]  ? ? ?SOCIAL HISTORY: ?Social History  ? ?Socioeconomic History  ? Marital status: Married  ?  Spouse name: Not on file  ? Number of children: Not on file  ? Years of education: Not on file  ? Highest education level: Not on file  ?Occupational History  ? Not on file  ?Tobacco Use  ?  Smoking status: Every Day  ?  Packs/day: 1.00  ?  Years: 30.00  ?  Pack years: 30.00  ?  Types: Cigarettes  ? Smokeless tobacco: Never  ? Tobacco comments:  ?  since age 79 or 42  ?Vaping Use  ? Vaping Use: Never used  ?Substance and Sexual Activity  ? Alcohol use: Yes  ?  Alcohol/week: 2.0 standard drinks  ?  Types: 2 Standard drinks or equivalent per week  ?  Comment: 1-2 drinks per week  ? Drug use: Yes  ?  Types: Marijuana  ? Sexual activity: Not on file  ?Other Topics Concern  ? Not on file  ?Social History Narrative  ? 15 mins [place]; smoking; not much alcohol. Worked for [employer], 2019. .   ? ?Social Determinants of Health  ? ?Financial Resource Strain: Not on file  ?Food Insecurity: Not on file  ?Transportation Needs: Not on file  ?Physical  Activity: Not on file  ?Stress: Not on file  ?Social Connections: Not on file  ?Intimate Partner Violence: Not on file  ? ? ?FAMILY HISTORY: ?Family History  ?Problem Relation Age of Onset  ? Diabetes Moth

## 2022-02-05 LAB — THYROID PANEL WITH TSH
Free Thyroxine Index: 2.1 (ref 1.2–4.9)
T3 Uptake Ratio: 41 % — ABNORMAL HIGH (ref 24–39)
T4, Total: 5 ug/dL (ref 4.5–12.0)
TSH: 4.15 u[IU]/mL (ref 0.450–4.500)

## 2022-02-07 ENCOUNTER — Ambulatory Visit
Admission: RE | Admit: 2022-02-07 | Discharge: 2022-02-07 | Disposition: A | Discharge status: Home / Self Care | Payer: Medicare PPO | Source: Ambulatory Visit | Attending: Internal Medicine | Admitting: Internal Medicine

## 2022-02-07 DIAGNOSIS — R531 Weakness: Secondary | ICD-10-CM | POA: Diagnosis present

## 2022-02-07 DIAGNOSIS — C3411 Malignant neoplasm of upper lobe, right bronchus or lung: Secondary | ICD-10-CM | POA: Insufficient documentation

## 2022-02-07 MED ORDER — GADOBUTROL 1 MMOL/ML IV SOLN
6.0000 mL | Freq: Once | INTRAVENOUS | Status: AC | PRN
  Administered 2022-02-07: 7.5 mL via INTRAVENOUS

## 2022-02-09 ENCOUNTER — Inpatient Hospital Stay: Payer: Medicare PPO

## 2022-02-09 ENCOUNTER — Other Ambulatory Visit: Payer: Self-pay | Admitting: *Deleted

## 2022-02-09 VITALS — BP 88/56 | HR 99 | Temp 98.7°F | Resp 18 | Wt 180.8 lb

## 2022-02-09 DIAGNOSIS — C3411 Malignant neoplasm of upper lobe, right bronchus or lung: Secondary | ICD-10-CM

## 2022-02-09 DIAGNOSIS — Z5112 Encounter for antineoplastic immunotherapy: Secondary | ICD-10-CM | POA: Diagnosis not present

## 2022-02-09 MED ORDER — SODIUM CHLORIDE 0.9 % IV SOLN
Freq: Once | INTRAVENOUS | Status: AC
  Filled 2022-02-09: qty 250

## 2022-02-09 MED ORDER — OXYCODONE HCL 5 MG PO TABS: 5.0000 mg | ORAL_TABLET | Freq: Four times a day (QID) | ORAL | 0 refills | Status: DC | PRN

## 2022-02-09 MED ORDER — SODIUM CHLORIDE 0.9 % IV SOLN
10.0000 mg | Freq: Once | INTRAVENOUS | Status: DC
  Filled 2022-02-09: qty 1

## 2022-02-09 MED ORDER — HEPARIN SOD (PORK) LOCK FLUSH 100 UNIT/ML IV SOLN
500.0000 [IU] | Freq: Once | INTRAVENOUS | Status: AC | PRN
  Administered 2022-02-09: 500 [IU]
  Filled 2022-02-09: qty 5

## 2022-02-09 MED ORDER — SODIUM CHLORIDE 0.9% FLUSH
10.0000 mL | Freq: Once | INTRAVENOUS | Status: AC | PRN
  Administered 2022-02-09: 10 mL
  Filled 2022-02-09: qty 10

## 2022-02-09 NOTE — Progress Notes (Signed)
1L IV hydration today. No steroid as pt is receiving Keytruda. Discharged to home at completion. ?

## 2022-02-10 ENCOUNTER — Encounter: Payer: Self-pay | Admitting: Internal Medicine

## 2022-02-11 ENCOUNTER — Inpatient Hospital Stay: Payer: Medicare PPO

## 2022-02-11 VITALS — BP 118/76 | HR 95 | Temp 98.6°F | Wt 179.6 lb

## 2022-02-11 DIAGNOSIS — C3411 Malignant neoplasm of upper lobe, right bronchus or lung: Secondary | ICD-10-CM

## 2022-02-11 DIAGNOSIS — Z5112 Encounter for antineoplastic immunotherapy: Secondary | ICD-10-CM | POA: Diagnosis not present

## 2022-02-11 MED ORDER — SODIUM CHLORIDE 0.9 % IV SOLN
Freq: Once | INTRAVENOUS | Status: AC
  Filled 2022-02-11: qty 250

## 2022-02-11 MED ORDER — HEPARIN SOD (PORK) LOCK FLUSH 100 UNIT/ML IV SOLN
500.0000 [IU] | Freq: Once | INTRAVENOUS | Status: AC | PRN
  Administered 2022-02-11: 500 [IU]
  Filled 2022-02-11: qty 5

## 2022-02-11 MED ORDER — SODIUM CHLORIDE 0.9% FLUSH
10.0000 mL | Freq: Once | INTRAVENOUS | Status: AC | PRN
  Administered 2022-02-11: 10 mL
  Filled 2022-02-11: qty 10

## 2022-02-13 ENCOUNTER — Other Ambulatory Visit: Payer: Self-pay | Admitting: Internal Medicine

## 2022-02-15 ENCOUNTER — Encounter: Payer: Self-pay | Admitting: Internal Medicine

## 2022-02-16 ENCOUNTER — Other Ambulatory Visit: Payer: Self-pay | Admitting: *Deleted

## 2022-02-16 ENCOUNTER — Inpatient Hospital Stay: Payer: Medicare PPO

## 2022-02-16 VITALS — BP 107/65 | HR 103 | Temp 98.1°F | Resp 18 | Wt 178.8 lb

## 2022-02-16 DIAGNOSIS — Z5112 Encounter for antineoplastic immunotherapy: Secondary | ICD-10-CM | POA: Diagnosis not present

## 2022-02-16 DIAGNOSIS — C3411 Malignant neoplasm of upper lobe, right bronchus or lung: Secondary | ICD-10-CM

## 2022-02-16 MED ORDER — SODIUM CHLORIDE 0.9% FLUSH
10.0000 mL | Freq: Once | INTRAVENOUS | Status: AC | PRN
  Administered 2022-02-16: 10 mL
  Filled 2022-02-16: qty 10

## 2022-02-16 MED ORDER — HEPARIN SOD (PORK) LOCK FLUSH 100 UNIT/ML IV SOLN
500.0000 [IU] | Freq: Once | INTRAVENOUS | Status: AC | PRN
  Administered 2022-02-16: 500 [IU]
  Filled 2022-02-16: qty 5

## 2022-02-16 MED ORDER — SODIUM CHLORIDE 0.9 % IV SOLN
Freq: Once | INTRAVENOUS | Status: AC
  Filled 2022-02-16: qty 250

## 2022-02-16 NOTE — Patient Instructions (Signed)

## 2022-02-17 ENCOUNTER — Encounter: Payer: Self-pay | Admitting: Internal Medicine

## 2022-02-18 ENCOUNTER — Inpatient Hospital Stay: Payer: Medicare PPO

## 2022-02-18 VITALS — BP 115/70 | HR 89 | Temp 98.3°F | Resp 18 | Wt 178.6 lb

## 2022-02-18 DIAGNOSIS — Z5112 Encounter for antineoplastic immunotherapy: Secondary | ICD-10-CM | POA: Diagnosis not present

## 2022-02-18 DIAGNOSIS — C3411 Malignant neoplasm of upper lobe, right bronchus or lung: Secondary | ICD-10-CM

## 2022-02-18 MED ORDER — SODIUM CHLORIDE 0.9% FLUSH
10.0000 mL | Freq: Once | INTRAVENOUS | Status: AC | PRN
  Administered 2022-02-18: 10 mL
  Filled 2022-02-18: qty 10

## 2022-02-18 MED ORDER — HEPARIN SOD (PORK) LOCK FLUSH 100 UNIT/ML IV SOLN
500.0000 [IU] | Freq: Once | INTRAVENOUS | Status: AC | PRN
  Administered 2022-02-18: 500 [IU]
  Filled 2022-02-18: qty 5

## 2022-02-18 MED ORDER — SODIUM CHLORIDE 0.9 % IV SOLN
Freq: Once | INTRAVENOUS | Status: AC
  Filled 2022-02-18: qty 250

## 2022-02-18 NOTE — Progress Notes (Signed)
Nutrition Follow-up:  Patient with lung cancer.  Unable to tolerate imfinzi.  Patient has received [place].    Met with patient in infusion for fluids with wife.  Wife reports for past 2 days  has only eaten a lunchable, 1/4 sub sandwich, 3 chicken wings and 5-6 bites of corn.  Drank maybe 2 shakes in the last few weeks.  Eating some watermelon and cantelope.  Taste comes and goes. Feeling weak last few days.    Avoiding steroids due to on immunotherapy    Medications: reviewed  Labs: reviewed  Anthropometrics:   Weight 178 lb 12.8 oz on 5/16 180 lb 1.6 oz on 4/18 174 lb on 3/30 169 lb on 3/14   NUTRITION DIAGNOSIS: Inadequate oral intake continues   INTERVENTION:  Encouraged nibbling q 1-2 hours even if just a bite or sip of shake. Complimentary case of ensure enlive given to patient today. Encouraged boost VHC, ensure shakes    MONITORING, EVALUATION, GOAL: weight trends, intake   NEXT VISIT: as needed  Jaxson Keener B. Zenia Resides, [place], [place] Registered Dietitian [phone]

## 2022-02-18 NOTE — Progress Notes (Signed)
Pt has been more fatigued over last 2 days, appetite has dropped as well. Coughing exacerbates his rib pain on the right thoracic side. Pt reports he has wheezing which is basically chronic and unchanged. O2 saturation is 98%. VSS. Afebrile. Receiving his bi-weekly IVF's. He has an appt to see Dr B next week.

## 2022-02-23 ENCOUNTER — Inpatient Hospital Stay: Payer: Medicare PPO

## 2022-02-23 VITALS — BP 117/70 | HR 90 | Temp 97.9°F | Resp 18 | Wt 181.0 lb

## 2022-02-23 DIAGNOSIS — C3411 Malignant neoplasm of upper lobe, right bronchus or lung: Secondary | ICD-10-CM

## 2022-02-23 DIAGNOSIS — Z5112 Encounter for antineoplastic immunotherapy: Secondary | ICD-10-CM | POA: Diagnosis not present

## 2022-02-23 MED ORDER — HEPARIN SOD (PORK) LOCK FLUSH 100 UNIT/ML IV SOLN
500.0000 [IU] | Freq: Once | INTRAVENOUS | Status: AC | PRN
  Administered 2022-02-23: 500 [IU]
  Filled 2022-02-23: qty 5

## 2022-02-23 MED ORDER — SODIUM CHLORIDE 0.9% FLUSH
10.0000 mL | Freq: Once | INTRAVENOUS | Status: AC | PRN
  Administered 2022-02-23: 10 mL
  Filled 2022-02-23: qty 10

## 2022-02-23 MED ORDER — SODIUM CHLORIDE 0.9 % IV SOLN
Freq: Once | INTRAVENOUS | Status: AC
  Filled 2022-02-23: qty 250

## 2022-02-25 ENCOUNTER — Encounter: Payer: Self-pay | Admitting: Internal Medicine

## 2022-02-25 ENCOUNTER — Inpatient Hospital Stay: Payer: Medicare PPO

## 2022-02-25 ENCOUNTER — Inpatient Hospital Stay (HOSPITAL_BASED_OUTPATIENT_CLINIC_OR_DEPARTMENT_OTHER): Payer: Medicare PPO | Admitting: Internal Medicine

## 2022-02-25 VITALS — HR 96

## 2022-02-25 DIAGNOSIS — C3411 Malignant neoplasm of upper lobe, right bronchus or lung: Secondary | ICD-10-CM

## 2022-02-25 DIAGNOSIS — Z5112 Encounter for antineoplastic immunotherapy: Secondary | ICD-10-CM | POA: Diagnosis not present

## 2022-02-25 DIAGNOSIS — R531 Weakness: Secondary | ICD-10-CM

## 2022-02-25 LAB — CBC WITH DIFFERENTIAL/PLATELET
Abs Immature Granulocytes: 0.05 10*3/uL (ref 0.00–0.07)
Basophils Absolute: 0.1 10*3/uL (ref 0.0–0.1)
Basophils Relative: 1 %
Eosinophils Absolute: 0.7 10*3/uL — ABNORMAL HIGH (ref 0.0–0.5)
Eosinophils Relative: 8 %
HCT: 29.5 % — ABNORMAL LOW (ref 39.0–52.0)
Hemoglobin: 9.5 g/dL — ABNORMAL LOW (ref 13.0–17.0)
Immature Granulocytes: 1 %
Lymphocytes Relative: 9 %
Lymphs Abs: 0.8 10*3/uL (ref 0.7–4.0)
MCH: 29.1 pg (ref 26.0–34.0)
MCHC: 32.2 g/dL (ref 30.0–36.0)
MCV: 90.5 fL (ref 80.0–100.0)
Monocytes Absolute: 0.7 10*3/uL (ref 0.1–1.0)
Monocytes Relative: 8 %
Neutro Abs: 5.9 10*3/uL (ref 1.7–7.7)
Neutrophils Relative %: 73 %
Platelets: 255 10*3/uL (ref 150–400)
RBC: 3.26 MIL/uL — ABNORMAL LOW (ref 4.22–5.81)
RDW: 15.7 % — ABNORMAL HIGH (ref 11.5–15.5)
WBC: 8.1 10*3/uL (ref 4.0–10.5)
nRBC: 0 % (ref 0.0–0.2)

## 2022-02-25 LAB — COMPREHENSIVE METABOLIC PANEL
ALT: 11 U/L (ref 0–44)
AST: 22 U/L (ref 15–41)
Albumin: 2.9 g/dL — ABNORMAL LOW (ref 3.5–5.0)
Alkaline Phosphatase: 78 U/L (ref 38–126)
Anion gap: 8 (ref 5–15)
BUN: 8 mg/dL (ref 8–23)
CO2: 26 mmol/L (ref 22–32)
Calcium: 8.7 mg/dL — ABNORMAL LOW (ref 8.9–10.3)
Chloride: 101 mmol/L (ref 98–111)
Creatinine, Ser: 0.98 mg/dL (ref 0.61–1.24)
GFR, Estimated: >60 mL/min (ref >60)
Glucose, Bld: 296 mg/dL — ABNORMAL HIGH (ref 70–99)
Potassium: 3.4 mmol/L — ABNORMAL LOW (ref 3.5–5.1)
Sodium: 135 mmol/L (ref 135–145)
Total Bilirubin: 0.3 mg/dL (ref 0.3–1.2)
Total Protein: 7 g/dL (ref 6.5–8.1)

## 2022-02-25 MED ORDER — PROPRANOLOL HCL ER 60 MG PO CP24: 60.0000 mg | ORAL_CAPSULE | Freq: Every day | ORAL | 3 refills | Status: DC

## 2022-02-25 MED ORDER — SODIUM CHLORIDE 0.9% FLUSH
10.0000 mL | Freq: Once | INTRAVENOUS | Status: AC
  Administered 2022-02-25: 10 mL via INTRAVENOUS
  Filled 2022-02-25: qty 10

## 2022-02-25 MED ORDER — SODIUM CHLORIDE 0.9 % IV SOLN
Freq: Once | INTRAVENOUS | Status: AC
  Filled 2022-02-25: qty 250

## 2022-02-25 MED ORDER — HEPARIN SOD (PORK) LOCK FLUSH 100 UNIT/ML IV SOLN
500.0000 [IU] | Freq: Once | INTRAVENOUS | Status: AC
  Filled 2022-02-25: qty 5

## 2022-02-25 MED ORDER — HEPARIN SOD (PORK) LOCK FLUSH 100 UNIT/ML IV SOLN
INTRAVENOUS | Status: AC
  Administered 2022-02-25: 500 [IU] via INTRAVENOUS
  Filled 2022-02-25: qty 5

## 2022-02-25 MED ORDER — SODIUM CHLORIDE 0.9 % IV SOLN
200.0000 mg | Freq: Once | INTRAVENOUS | Status: AC
  Administered 2022-02-25: 200 mg via INTRAVENOUS
  Filled 2022-02-25: qty 8

## 2022-02-25 MED ORDER — HEPARIN SOD (PORK) LOCK FLUSH 100 UNIT/ML IV SOLN
500.0000 [IU] | Freq: Once | INTRAVENOUS | Status: DC | PRN
  Filled 2022-02-25: qty 5

## 2022-02-25 NOTE — Assessment & Plan Note (Signed)
#  UNRESECTABLE/LOCALLY ADVANCED- Right upper lobe lung mass -concerning for malignancy [biopsy inconclusive/atypical cells]-   carbo Abraxane with radiation [finished end of Oct 2022]. MARCH 2023-CT scan shows Necrotic mass of the right upper lobe; improvement of the pneumonic process. April 2023- PD-L1 positive [cirucologene].  # Proceed with [name] #2;Labs today reviewed;  acceptable for treatment today. Plan re-imaging after 3 cycles of treatment.   #Right chest wall pain-/pleuritic- ON  fenatny patch 25 mcg.  Oxycodone 5 mg every 6 hours as needed [3-4 a day]- STABLE.   # COPD/fatigue- [coughing/phlegm/ no fevers]- on albuterol/advair [smoking]- TSH- WNL;Continue  Xopenex/ipratropium nebs-; Mucinex- DM BID.  S/p recent evaluation with pulmonary-await chest x-ray to be faxed.  # Tremors/ gait instability: MAY 2023 MRI Brain- negative for brain metastases.  Reviewed incidental findings of chronic stroke.  Start patient on propranolol.  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 Bary Castilla. Hold surgery for now-especially context of his incurable lung malignancy. STABLE.   # Poorly controlled diabetes-blood sugars 113- 280;  Continue glipizide; and  Metformin. Monitor for now.   # IV mediport: functioning/STABLE  *appt thru mychart   # DISPOSITION: # Keytruda today; ALSO- add IVFs over 1 hour.  # in 1 week- IVFs-1 lit /1 hourTuesdays/Thursday  # in 2 weeks-  IVFs-1 lit /1 hourTuesdays/Thursday  # follow up in 3 weeks-MD;- labs- cbc/cmp; Keytruda;  IVFs over 1 hour.  Dr.B

## 2022-02-25 NOTE — Patient Instructions (Signed)
[facility] [place]  Discharge Instructions: Thank you for choosing [facility] to provide your oncology and hematology care.  If you have a lab appointment with the [facility], please go directly to the [facility] and check in at the registration area.  Wear comfortable clothing and clothing appropriate for easy access to any Portacath or PICC line.   We strive to give you quality time with your provider. You may need to reschedule your appointment if you arrive late (15 or more minutes).  Arriving late affects you and other patients whose appointments are after yours.  Also, if you miss three or more appointments without notifying the office, you may be dismissed from the clinic at the provider's discretion.      For prescription refill requests, have your pharmacy contact our office and allow 72 hours for refills to be completed.    Today you received the following chemotherapy and/or immunotherapy agents Keytruda      To help prevent nausea and vomiting after your treatment, we encourage you to take your nausea medication as directed.  BELOW ARE SYMPTOMS THAT SHOULD BE REPORTED IMMEDIATELY: *FEVER GREATER THAN 100.4 F (38 C) OR HIGHER *CHILLS OR SWEATING *NAUSEA AND VOMITING THAT IS NOT CONTROLLED WITH YOUR NAUSEA MEDICATION *UNUSUAL SHORTNESS OF BREATH *UNUSUAL BRUISING OR BLEEDING *URINARY PROBLEMS (pain or burning when urinating, or frequent urination) *BOWEL PROBLEMS (unusual diarrhea, constipation, pain near the anus) TENDERNESS IN MOUTH AND THROAT WITH OR WITHOUT PRESENCE OF ULCERS (sore throat, sores in mouth, or a toothache) UNUSUAL RASH, SWELLING OR PAIN  UNUSUAL VAGINAL DISCHARGE OR ITCHING   Items with * indicate a potential emergency and should be followed up as soon as possible or go to the Emergency Department if any problems should occur.  Please show the CHEMOTHERAPY ALERT CARD or IMMUNOTHERAPY ALERT CARD at check-in to  the Emergency Department and triage nurse.  Should you have questions after your visit or need to cancel or reschedule your appointment, please contact [facility]CER [address] AT [place]  [phone] and follow the prompts.  Office hours are 8:00 a.m. to 4:30 p.m. Monday - Friday. Please note that voicemails left after 4:00 p.m. may not be returned until the following business day.  We are closed weekends and major holidays. You have access to a nurse at all times for urgent questions. Please call the main number to the clinic [phone] and follow the prompts.  For any non-urgent questions, you may also contact your provider using MyChart. We now offer e-Visits for anyone 64 and older to request care online for non-urgent symptoms. For details visit [email or web address].   Also download the MyChart app! Go to the app store, search "MyChart", open the app, select [facility], and log in with your MyChart username and password.  Due to Covid, a mask is required upon entering the hospital/clinic. If you do not have a mask, one will be given to you upon arrival. For doctor visits, patients may have 1 support person aged 55 or older with them. For treatment visits, patients cannot have anyone with them due to current Covid guidelines and our immunocompromised population.

## 2022-02-25 NOTE — Progress Notes (Signed)
Right hand shakes.  Pt had a chest xray at [facility], would like you to review the results with them. In care everywhere.

## 2022-02-25 NOTE — Progress Notes (Signed)
[facility] NOTE  Patient Care Team: [name], MD as PCP - General (Family Medicine) [name], RN as Oncology Nurse Navigator [name], MD as Consulting Physician (Urology) [name], MD as Consulting Physician (Oncology)  CHIEF COMPLAINTS/PURPOSE OF CONSULTATION: lung cancer    Oncology History Overview Note  AUG 2022- 8.2 x 5.9 cm spiculated mass noted in right upper lobe consistent with malignancy. It extends from the right hilum to the lateral chest wall and results in lytic destruction of the lateral portion of the right fourth rib.  Mediastinal lymph nodes are noted, including 12 mm right hilar lymph node, consistent with metastatic disease. 3 cm right adrenal mass is noted concerning for metastatic disease.  # AUG 2022- PET IMPRESSION: Peripherally hypermetabolic right upper lobe mass, likely due to central necrosis. Mass extends into the right hilum and right lateral chest wall involving the second through fourth right ribs.   Mildly enlarged and hypermetabolic right hilar lymph node.   No evidence of metastatic disease in the abdomen or pelvis.  # AUG, 2022-RIGHT CHEST WALL Bx-necrosis; suspicious for malignancy not definitive [discussed with Dr.Kraynie;MDT] LUNG MASS, RIGHT; BIOPSY:  - SUSPICIOUS FOR MALIGNANCY.  - SEE COMMENT.   Comment:  Approximately six tissue cores demonstrate inflamed fibrous capsule with  hemosiderin-laden macrophages, in a background of abundant necrosis.  One of the cores demonstrates a minute fragment of viable epithelial  cells.  These cells are positive for p40.  They are negative for TTF1  and CK7. The cells of interest disappear on deeper cut levels.  While  the findings are suspicious for squamous cell carcinoma, there is not  enough viable tumor for a definitive diagnosis.  Additional tissue  sampling may be helpful.   # SEP 2022-cycle #2 Taxol hypersensitive reaction.  Switched  over to #3 cycle- carbo-Abraxane starting 06/29/2021  # DEC 2022- s/p ID- Dr.ravishankar- ? Lung abscess-no antibiotics-  JAN 2023- CT scan incidental- cecal mass- colonoscopy[Dr.Russo; [facility]- high grade dysplasia]  S/p evaluation with Dr. [name] surgery for now]   # S/p evaluation with Dr. [name].   Primary cancer of right upper lobe of lung ([place])  06/03/2021 Initial Diagnosis   Primary cancer of right upper lobe of lung ([place])   06/03/2021 Cancer Staging   Staging form: Lung, AJCC 8th Edition - Clinical: Stage IIIB (cT3, cN2, cM0) - Signed by [name], MD on 06/03/2021    06/15/2021 - 08/03/2021 Chemotherapy   Patient is on Treatment Plan : LUNG Carboplatin / Paclitaxel + XRT q7d      10/02/2021 - 10/19/2021 Chemotherapy   Patient is on Treatment Plan : LUNG Durvalumab q14d      02/04/2022 -  Chemotherapy   Patient is on Treatment Plan : LUNG NSCLC Pembrolizumab (200) q21d         HISTORY OF PRESENTING ILLNESS: Patient is ambulating independently.  Accompanied by wife.  Kevyn Boquet 66 y.o.  male history of smoking -"lung cancer" [Limited necrotic tissue]-locally advanced unresectable currently on single agent Keytruda.  Patient interim was evaluated by pulmonary-had a chest x-ray in the clinic.  Also s/p antibiotics.  Cough is improved not resolved.  Continues to have intermittent pain in his right chest wall.  Continues to have intermittent tremors.  He continues to have intermittent chest wall pain currently improved on narcotic pain medication.  Overall his appetite improved.  Is gaining weight.     Review of Systems  Constitutional:  Positive for  malaise/fatigue and weight loss. Negative for chills, diaphoresis and fever.  HENT:  Negative for nosebleeds and sore throat.   Eyes:  Negative for double vision.  Respiratory:  Positive for cough, sputum production and shortness of breath. Negative for hemoptysis and wheezing.   Cardiovascular:  Positive  for chest pain. Negative for palpitations, orthopnea and leg swelling.  Gastrointestinal:  Positive for nausea. Negative for abdominal pain, blood in stool, constipation, diarrhea, heartburn, melena and vomiting.  Genitourinary:  Negative for dysuria, frequency and urgency.  Musculoskeletal:  Negative for back pain and joint pain.  Skin: Negative.  Negative for itching and rash.  Neurological:  Positive for tingling. Negative for dizziness, focal weakness, weakness and headaches.  Endo/Heme/Allergies:  Does not bruise/bleed easily.  Psychiatric/Behavioral:  Negative for depression. The patient is not nervous/anxious and does not have insomnia.     MEDICAL HISTORY:  Past Medical History:  Diagnosis Date   Cancer ([place])    Degenerative arthritis of knee    Depression    Diabetes mellitus without complication ([place])    Diverticulitis    Glaucoma    since 2004   Hernia 1979   History of kidney stones    Hypertension    Personal history of tobacco use, presenting hazards to health    Pre-diabetes    Screening for obesity    Special screening for malignant neoplasms, colon    Wears dentures    full upper and lower    SURGICAL HISTORY: Past Surgical History:  Procedure Laterality Date   [place], 2012   lumbar bulging disc   CATARACT EXTRACTION W/PHACO Left 01/20/2021   Procedure: CATARACT EXTRACTION PHACO AND INTRAOCULAR LENS PLACEMENT ([place]) LEFT;  Surgeon: Birder Robson, MD;  Location: [facility];  Service: Ophthalmology;  Laterality: Left;  10.13 0:52.1   COLONOSCOPY  [id number],7001   [facility]/Dr. Buelah Manis sessile polyp,24mm, found in rectum,multiple diverticula were found in the sigmoid, in descending and in transverse colon   COLONOSCOPY WITH PROPOFOL N/A 10/22/2021   Procedure: COLONOSCOPY WITH PROPOFOL;  Surgeon: Annamaria Helling, DO;  [facility] ENDOSCOPY;  Service: Gastroenterology;  Laterality: N/A;  DM   COLOSTOMY  04-20-13   HERNIA REPAIR  1979    inguinal   IR IMAGING GUIDED PORT INSERTION  06/10/2021   JOINT REPLACEMENT Right    knee   KNEE ARTHROPLASTY Right 05/08/2018   Procedure: COMPUTER ASSISTED TOTAL KNEE ARTHROPLASTY;  Surgeon: Dereck Leep, MD;  Location: [facility] ORS;  Service: Orthopedics;  Laterality: Right;   KNEE ARTHROPLASTY Left 11/16/2019   Procedure: COMPUTER ASSISTED TOTAL KNEE ARTHROPLASTY;  Surgeon: Dereck Leep, MD;  Location: [facility] ORS;  Service: Orthopedics;  Laterality: Left;   LAPAROTOMY  04-20-13   colon resection with colosotmy   LITHOTRIPSY  2013   TONSILLECTOMY AND ADENOIDECTOMY  1962    SOCIAL HISTORY: Social History   Socioeconomic History   Marital status: Married    Spouse name: Not on file   Number of children: Not on file   Years of education: Not on file   Highest education level: Not on file  Occupational History   Not on file  Tobacco Use   Smoking status: Every Day    Packs/day: 1.00    Years: 30.00    Pack years: 30.00    Types: Cigarettes   Smokeless tobacco: Never   Tobacco comments:    since age 25 or 29  Vaping Use   Vaping Use: Never used  Substance and  Sexual Activity   Alcohol use: Yes    Alcohol/week: 2.0 standard drinks    Types: 2 Standard drinks or equivalent per week    Comment: 1-2 drinks per week   Drug use: Yes    Types: Marijuana   Sexual activity: Not on file  Other Topics Concern   Not on file  Social History Narrative   15 mins [place]; smoking; not much alcohol. Worked for [employer], 2019. Marland Kitchen    Social Determinants of Health   Financial Resource Strain: Not on file  Food Insecurity: Not on file  Transportation Needs: Not on file  Physical Activity: Not on file  Stress: Not on file  Social Connections: Not on file  Intimate Partner Violence: Not on file    FAMILY HISTORY: Family History  Problem Relation Age of Onset   Diabetes Mother    Heart disease Mother    Heart attack Father    Prostate cancer Father      ALLERGIES:  is allergic to paclitaxel, ambien [zolpidem], nsaids, and aleve [naproxen sodium].  MEDICATIONS:  Current Outpatient Medications  Medication Sig Dispense Refill   acetaminophen (TYLENOL) 500 MG tablet Take 2 tablets (1,000 mg total) by mouth daily as needed. Home med. 30 tablet 0   baclofen (LIORESAL) 10 MG tablet Take 5 mg by mouth at bedtime as needed for muscle spasms.     blood glucose meter kit and supplies KIT Check your blood glucose levels twice a day; once in the morning before breakfast; and once in the evening after dinner. 1 each 0   cetirizine (ZYRTEC) 10 MG tablet Take 10 mg by mouth daily.     Cyanocobalamin (VITAMIN B-12 PO) Take by mouth daily.     dextromethorphan-guaiFENesin (MUCINEX DM) 30-600 MG 12hr tablet Take 1 tablet by mouth 2 (two) times daily as needed for cough.     diphenhydramine-acetaminophen (TYLENOL PM) 25-500 MG TABS Take 1-2 tablets by mouth at bedtime as needed (sleep.).      dorzolamide-timolol (COSOPT) 22.3-6.8 MG/ML ophthalmic solution Place 1 drop into both eyes 2 (two) times daily.     dronabinol (MARINOL) 5 MG capsule Take 1 capsule (5 mg total) by mouth 2 (two) times daily before lunch and supper. 60 capsule 0   eszopiclone (LUNESTA) 1 MG TABS tablet Take 1 tablet (1 mg total) by mouth at bedtime as needed for sleep. Take immediately before bedtime 30 tablet 0   feeding supplement (ENSURE ENLIVE / ENSURE PLUS) LIQD Take 237 mLs by mouth 3 (three) times daily between meals.     fentaNYL (DURAGESIC) 25 MCG/HR Place 1 patch onto the skin every 3 (three) days. 5 patch 0   glipiZIDE (GLUCOTROL) 5 MG tablet Take 1 tablet (5 mg total) by mouth 2 (two) times daily before a meal. 60 tablet 3   glucose blood test strip Check your blood glucose levels twice a day; once in the morning before breakfast; and once in the evening after dinner. 100 each 12   guaiFENesin-codeine 100-10 MG/5ML syrup Take 5 mLs by mouth daily as needed for cough. 120 mL 0    ipratropium (ATROVENT) 0.02 % nebulizer solution Take 2.5 mLs (0.5 mg total) by nebulization 4 (four) times daily. 360 mL 3   ipratropium-albuterol (DUONEB) 0.5-2.5 (3) MG/3ML SOLN Take 3 mLs by nebulization every 4 (four) hours as needed. 360 mL 3   Lancets (FREESTYLE) lancets Check your blood glucose levels twice a day; once in the morning before breakfast;  and once in the evening after dinner. 100 each 12   latanoprost (XALATAN) 0.005 % ophthalmic solution Place 1 drop into both eyes at bedtime.     lidocaine-prilocaine (EMLA) cream Apply 1 application. topically as needed. 30 g 3   metFORMIN (GLUCOPHAGE) 500 MG tablet Take 1 tablet (500 mg total) by mouth 2 (two) times daily with a meal. 60 tablet 2   montelukast (SINGULAIR) 10 MG tablet TAKE 1 TABLET(10 MG) BY MOUTH AT BEDTIME 90 tablet 0   Multiple Vitamin (MULTIVITAMIN WITH MINERALS) TABS tablet Take 1 tablet by mouth daily.     oxyCODONE (OXY IR/ROXICODONE) 5 MG immediate release tablet Take 1 tablet (5 mg total) by mouth every 6 (six) hours as needed for severe pain. 60 tablet 0   potassium chloride SA (KLOR-CON M) 20 MEQ tablet Take 1 tablet (20 mEq total) by mouth daily. 15 tablet 0   pregabalin (LYRICA) 50 MG capsule TAKE 1 CAPSULE(50 MG) BY MOUTH TWICE DAILY 60 capsule 1   propranolol ER (INDERAL LA) 60 MG 24 hr capsule Take 1 capsule (60 mg total) by mouth daily. 30 capsule 3   Respiratory Therapy Supplies (NEBULIZER) DEVI Use as directed 1 each 0   sulfamethoxazole-trimethoprim (BACTRIM) 400-80 MG tablet Take by mouth.     tamsulosin (FLOMAX) 0.4 MG CAPS capsule Take 0.4 mg by mouth daily.     venlafaxine XR (EFFEXOR-XR) 75 MG 24 hr capsule Take 225 mg by mouth daily.     WIXELA INHUB 500-50 MCG/ACT AEPB INHALE 1 PUFF INTO THE LUNGS IN THE MORNING AND AT BEDTIME 60 each 1   levalbuterol (XOPENEX) 0.63 MG/3ML nebulizer solution Take 3 mLs (0.63 mg total) by nebulization every 6 (six) hours as needed for wheezing or shortness of  breath. 28 mL 12   No current facility-administered medications for this visit.   Facility-Administered Medications Ordered in Other Visits  Medication Dose Route Frequency Provider Last Rate Last Admin   heparin lock flush 100 UNIT/ML injection            heparin lock flush 100 UNIT/ML injection            heparin lock flush 100 UNIT/ML injection            heparin lock flush 100 unit/mL  500 Units Intravenous Once Charlaine Dalton R, MD          .  PHYSICAL EXAMINATION: ECOG PERFORMANCE STATUS: 1 - Symptomatic but completely ambulatory  Vitals:   02/25/22 0850  BP: 120/67  Pulse: (!) 108  Temp: 98.7 F (37.1 C)  SpO2: 100%     Filed Weights   02/25/22 0850  Weight: 178 lb 8 oz (81 kg)      Physical Exam Vitals and nursing note reviewed.  HENT:     Head: Normocephalic and atraumatic.     Mouth/Throat:     Pharynx: Oropharynx is clear.  Eyes:     Extraocular Movements: Extraocular movements intact.     Pupils: Pupils are equal, round, and reactive to light.  Cardiovascular:     Rate and Rhythm: Normal rate and regular rhythm.  Pulmonary:     Comments: Decreased breath sounds bilaterally.  Abdominal:     Palpations: Abdomen is soft.  Musculoskeletal:        General: Normal range of motion.     Cervical back: Normal range of motion.  Skin:    General: Skin is warm.  Neurological:     General: No focal  deficit present.     Mental Status: He is alert and oriented to person, place, and time.  Psychiatric:        Behavior: Behavior normal.        Judgment: Judgment normal.     LABORATORY DATA:  I have reviewed the data as listed Lab Results  Component Value Date   WBC 8.1 02/25/2022   HGB 9.5 (L) 02/25/2022   HCT 29.5 (L) 02/25/2022   MCV 90.5 02/25/2022   PLT 255 02/25/2022   Recent Labs    01/21/22 0920 01/26/22 0912 02/04/22 0845 02/25/22 0835  NA 137 135 135 135  K 3.4* 3.6 3.6 3.4*  CL 102 105 100 101  CO2 $Re'27 25 26 26  'ypc$ GLUCOSE 189*  137* 244* 296*  BUN $Re'10 9 10 8  'EXr$ CREATININE 0.90 0.85 0.82 0.98  CALCIUM 9.0 8.1* 8.7* 8.7*  GFRNONAA >60 >60 >60 >60  PROT 7.0  --  6.9 7.0  ALBUMIN 3.4*  --  2.9* 2.9*  AST 14*  --  21 22  ALT 11  --  13 11  ALKPHOS 87  --  90 78  BILITOT 0.4  --  0.4 0.3    RADIOGRAPHIC STUDIES: I have personally reviewed the radiological images as listed and agreed with the findings in the report. MR BRAIN W WO CONTRAST  Result Date: 02/07/2022 CLINICAL DATA:  66 year old male with lung cancer. Dizziness and gait instability. EXAM: MRI HEAD WITHOUT AND WITH CONTRAST TECHNIQUE: Multiplanar, multiecho pulse sequences of the brain and surrounding structures were obtained without and with intravenous contrast. CONTRAST:  7.60mL GADAVIST GADOBUTROL 1 MMOL/ML IV SOLN COMPARISON:  Staging MRI of the brain 06/12/2021. FINDINGS: Brain: No restricted diffusion to suggest acute infarction. No midline shift, mass effect, evidence of mass lesion, ventriculomegaly, extra-axial collection or acute intracranial hemorrhage. Cervicomedullary junction and pituitary are within normal limits. No abnormal enhancement identified. No dural thickening. Stable cerebral volume from last year. Pearline Cables and white matter signal appears stable throughout the brain and largely normal for age aside. Small chronic lacunar infarct of the left caudate. Evidence of small chronic bilateral cerebellar infarcts (series 10, image 7). No cerebral cortical encephalomalacia or chronic cerebral blood products identified. Vascular: Major intracranial vascular flow voids are stable since last year. Major dural venous sinuses are enhancing and appear to be patent. Skull and upper cervical spine: Negative visible cervical spine and spinal cord. Visualized bone marrow signal is within normal limits. Sinuses/Orbits: Stable and negative. Other: Mastoids remain clear. Visible internal auditory structures appear normal. Scalp and face appear negative. IMPRESSION: 1. No  metastatic disease or acute intracranial abnormality. 2. Stable MRI appearance of the brain since last year. Mild chronic small vessel ischemia. Electronically Signed   By: Genevie Ann M.D.   On: 02/07/2022 08:58    ASSESSMENT & PLAN:   Primary cancer of right upper lobe of lung ([place]) # UNRESECTABLE/LOCALLY ADVANCED- Right upper lobe lung mass -concerning for malignancy [biopsy inconclusive/atypical cells]-   carbo Abraxane with radiation [finished end of Oct 2022]. MARCH 2023-CT scan shows Necrotic mass of the right upper lobe; improvement of the pneumonic process. April 2023- PD-L1 positive [cirucologene].  # Proceed with [name] #2;Labs today reviewed;  acceptable for treatment today. Plan re-imaging after 3 cycles of treatment.   #Right chest wall pain-/pleuritic- ON  fenatny patch 25 mcg.  Oxycodone 5 mg every 6 hours as needed [3-4 a day]- STABLE.   # COPD/fatigue- [coughing/phlegm/ no fevers]- on albuterol/advair [smoking]- TSH-  WNL;Continue  Xopenex/ipratropium nebs-; Mucinex- DM BID.  S/p recent evaluation with pulmonary-await chest x-ray to be faxed.  # Tremors/ gait instability: MAY 2023 MRI Brain- negative for brain metastases.  Reviewed incidental findings of chronic stroke.  Start patient on propranolol.  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 Bary Castilla. Hold surgery for now-especially context of his incurable lung malignancy. STABLE.   # Poorly controlled diabetes-blood sugars 113- 280;  Continue glipizide; and  Metformin. Monitor for now.   # IV mediport: functioning/STABLE  *appt thru mychart   # DISPOSITION: # Keytruda today; ALSO- add IVFs over 1 hour.  # in 1 week- IVFs-1 lit /1 hourTuesdays/Thursday  # in 2 weeks-  IVFs-1 lit /1 hourTuesdays/Thursday  # follow up in 3 weeks-MD;- labs- cbc/cmp; Keytruda;  IVFs over 1 hour.  Dr.B   All questions were answered. The patient knows to  call the clinic with any problems, questions or concerns.    [name], MD 02/25/2022 9:45 AM

## 2022-03-02 ENCOUNTER — Other Ambulatory Visit: Payer: Self-pay | Admitting: Internal Medicine

## 2022-03-02 ENCOUNTER — Other Ambulatory Visit: Payer: Self-pay | Admitting: *Deleted

## 2022-03-02 ENCOUNTER — Inpatient Hospital Stay: Payer: Medicare PPO

## 2022-03-02 VITALS — BP 122/67 | HR 106 | Temp 97.5°F | Resp 16

## 2022-03-02 DIAGNOSIS — Z5112 Encounter for antineoplastic immunotherapy: Secondary | ICD-10-CM | POA: Diagnosis not present

## 2022-03-02 DIAGNOSIS — C3411 Malignant neoplasm of upper lobe, right bronchus or lung: Secondary | ICD-10-CM

## 2022-03-02 MED ORDER — SODIUM CHLORIDE 0.9 % IV SOLN
Freq: Once | INTRAVENOUS | Status: AC
  Filled 2022-03-02: qty 250

## 2022-03-02 MED ORDER — OXYCODONE HCL 5 MG PO TABS: 5.0000 mg | ORAL_TABLET | Freq: Four times a day (QID) | ORAL | 0 refills | Status: DC | PRN

## 2022-03-02 MED ORDER — ESZOPICLONE 1 MG PO TABS: 1.0000 mg | ORAL_TABLET | Freq: Every evening | ORAL | 0 refills | Status: DC | PRN

## 2022-03-02 MED ORDER — HEPARIN SOD (PORK) LOCK FLUSH 100 UNIT/ML IV SOLN
INTRAVENOUS | Status: AC
  Administered 2022-03-02: 500 [IU]
  Filled 2022-03-02: qty 5

## 2022-03-02 NOTE — Patient Instructions (Signed)

## 2022-03-04 ENCOUNTER — Inpatient Hospital Stay: Payer: Medicare PPO | Attending: Internal Medicine

## 2022-03-04 VITALS — BP 127/70 | HR 69 | Temp 98.7°F | Resp 16 | Wt 175.0 lb

## 2022-03-04 DIAGNOSIS — E1165 Type 2 diabetes mellitus with hyperglycemia: Secondary | ICD-10-CM | POA: Diagnosis not present

## 2022-03-04 DIAGNOSIS — Z5112 Encounter for antineoplastic immunotherapy: Secondary | ICD-10-CM | POA: Diagnosis present

## 2022-03-04 DIAGNOSIS — F17210 Nicotine dependence, cigarettes, uncomplicated: Secondary | ICD-10-CM | POA: Diagnosis not present

## 2022-03-04 DIAGNOSIS — J440 Chronic obstructive pulmonary disease with acute lower respiratory infection: Secondary | ICD-10-CM | POA: Diagnosis not present

## 2022-03-04 DIAGNOSIS — Z7984 Long term (current) use of oral hypoglycemic drugs: Secondary | ICD-10-CM | POA: Insufficient documentation

## 2022-03-04 DIAGNOSIS — D120 Benign neoplasm of cecum: Secondary | ICD-10-CM | POA: Insufficient documentation

## 2022-03-04 DIAGNOSIS — Z87442 Personal history of urinary calculi: Secondary | ICD-10-CM | POA: Diagnosis not present

## 2022-03-04 DIAGNOSIS — C3411 Malignant neoplasm of upper lobe, right bronchus or lung: Secondary | ICD-10-CM | POA: Diagnosis not present

## 2022-03-04 DIAGNOSIS — J189 Pneumonia, unspecified organism: Secondary | ICD-10-CM | POA: Insufficient documentation

## 2022-03-04 DIAGNOSIS — Z79899 Other long term (current) drug therapy: Secondary | ICD-10-CM | POA: Insufficient documentation

## 2022-03-04 DIAGNOSIS — I10 Essential (primary) hypertension: Secondary | ICD-10-CM | POA: Diagnosis not present

## 2022-03-04 MED ORDER — SODIUM CHLORIDE 0.9 % IV SOLN
Freq: Once | INTRAVENOUS | Status: AC
  Filled 2022-03-04: qty 250

## 2022-03-04 MED ORDER — SODIUM CHLORIDE 0.9% FLUSH
10.0000 mL | Freq: Once | INTRAVENOUS | Status: DC | PRN
  Filled 2022-03-04: qty 10

## 2022-03-04 MED ORDER — HEPARIN SOD (PORK) LOCK FLUSH 100 UNIT/ML IV SOLN
500.0000 [IU] | Freq: Once | INTRAVENOUS | Status: AC | PRN
  Administered 2022-03-04: 500 [IU]
  Filled 2022-03-04: qty 5

## 2022-03-09 ENCOUNTER — Ambulatory Visit
Admission: RE | Admit: 2022-03-09 | Discharge: 2022-03-09 | Disposition: A | Discharge status: Home / Self Care | Payer: Medicare PPO | Source: Ambulatory Visit | Attending: Nurse Practitioner | Admitting: Nurse Practitioner

## 2022-03-09 ENCOUNTER — Inpatient Hospital Stay (HOSPITAL_BASED_OUTPATIENT_CLINIC_OR_DEPARTMENT_OTHER): Payer: Medicare PPO | Admitting: Nurse Practitioner

## 2022-03-09 ENCOUNTER — Inpatient Hospital Stay: Payer: Medicare PPO

## 2022-03-09 VITALS — BP 103/63 | HR 80 | Temp 98.0°F | Resp 18 | Wt 174.3 lb

## 2022-03-09 DIAGNOSIS — J189 Pneumonia, unspecified organism: Secondary | ICD-10-CM | POA: Diagnosis not present

## 2022-03-09 DIAGNOSIS — C3411 Malignant neoplasm of upper lobe, right bronchus or lung: Secondary | ICD-10-CM | POA: Diagnosis not present

## 2022-03-09 DIAGNOSIS — R059 Cough, unspecified: Secondary | ICD-10-CM | POA: Insufficient documentation

## 2022-03-09 DIAGNOSIS — Z5112 Encounter for antineoplastic immunotherapy: Secondary | ICD-10-CM | POA: Diagnosis not present

## 2022-03-09 MED ORDER — HEPARIN SOD (PORK) LOCK FLUSH 100 UNIT/ML IV SOLN
500.0000 [IU] | Freq: Once | INTRAVENOUS | Status: AC | PRN
  Administered 2022-03-09: 500 [IU]
  Filled 2022-03-09: qty 5

## 2022-03-09 MED ORDER — HYDROCOD POLI-CHLORPHE POLI ER 10-8 MG/5ML PO SUER: 5.0000 mL | Freq: Every evening | ORAL | 0 refills | Status: DC | PRN

## 2022-03-09 MED ORDER — SODIUM CHLORIDE 0.9% FLUSH
10.0000 mL | Freq: Once | INTRAVENOUS | Status: AC | PRN
  Administered 2022-03-09: 10 mL
  Filled 2022-03-09: qty 10

## 2022-03-09 MED ORDER — SODIUM CHLORIDE 0.9 % IV SOLN
Freq: Once | INTRAVENOUS | Status: AC
  Filled 2022-03-09: qty 250

## 2022-03-09 NOTE — Progress Notes (Signed)
Symptom Management [place] at [place]. [facility] [address], [place], [place] [address] [phone] (phone) [phone] (fax)  Patient Care Team: [name], MD as PCP - General (Family Medicine) [name], RN as Oncology Nurse Navigator [name], MD as Consulting Physician (Urology) [name], MD as Consulting Physician (Oncology)   Name of the patient: Rodney Vega  [id number]  06-06-1956   Date of visit: 03/09/22  Diagnosis- Lung cancer  Chief complaint/ Reason for visit- productive cough  Heme/Onc history:  Oncology History Overview Note  AUG 2022- 8.2 x 5.9 cm spiculated mass noted in right upper lobe consistent with malignancy. It extends from the right hilum to the lateral chest wall and results in lytic destruction of the lateral portion of the right fourth rib.  Mediastinal lymph nodes are noted, including 12 mm right hilar lymph node, consistent with metastatic disease. 3 cm right adrenal mass is noted concerning for metastatic disease.  # AUG 2022- PET IMPRESSION: Peripherally hypermetabolic right upper lobe mass, likely due to central necrosis. Mass extends into the right hilum and right lateral chest wall involving the second through fourth right ribs.   Mildly enlarged and hypermetabolic right hilar lymph node.   No evidence of metastatic disease in the abdomen or pelvis.  # AUG, 2022-RIGHT CHEST WALL Bx-necrosis; suspicious for malignancy not definitive [discussed with Dr.Kraynie;MDT] LUNG MASS, RIGHT; BIOPSY:  - SUSPICIOUS FOR MALIGNANCY.  - SEE COMMENT.   Comment:  Approximately six tissue cores demonstrate inflamed fibrous capsule with  hemosiderin-laden macrophages, in a background of abundant necrosis.  One of the cores demonstrates a minute fragment of viable epithelial  cells.  These cells are positive for p40.  They are  negative for TTF1  and CK7. The cells of interest disappear on deeper cut levels.  While  the findings are suspicious for squamous cell carcinoma, there is not  enough viable tumor for a definitive diagnosis.  Additional tissue  sampling may be helpful.   # SEP 2022-cycle #2 Taxol hypersensitive reaction.  Switched over to #3 cycle- carbo-Abraxane starting 06/29/2021  # DEC 2022- s/p ID- Dr.ravishankar- ? Lung abscess-no antibiotics-  JAN 2023- CT scan incidental- cecal mass- colonoscopy[Dr.Russo; [facility]- high grade dysplasia]  S/p evaluation with Dr. [name] surgery for now]   # S/p evaluation with Dr. [name].   Primary cancer of right upper lobe of lung ([place])  06/03/2021 Initial Diagnosis   Primary cancer of right upper lobe of lung ([place])   06/03/2021 Cancer Staging   Staging form: Lung, AJCC 8th Edition - Clinical: Stage IIIB (cT3, cN2, cM0) - Signed by [name], MD on 06/03/2021    06/15/2021 - 08/03/2021 Chemotherapy   Patient is on Treatment Plan : LUNG Carboplatin / Paclitaxel + XRT q7d      10/02/2021 - 10/19/2021 Chemotherapy   Patient is on Treatment Plan : LUNG Durvalumab q14d      02/04/2022 -  Chemotherapy   Patient is on Treatment Plan : LUNG NSCLC Pembrolizumab (200) q21d        Interval history-patient is a 66 year old male with above history of lung cancer currently on pembrolizumab who presents to symptom management clinic for concerns of productive cough.  Cough has been chronic but over past couple of days he has noticed increased exertion and productivity.  Describes thick sputum and sensation of congestion in his throat and chest.  Most  bothersome at night.  Chest feels tight.  No fevers or chills.  Review of systems- Review of Systems  Constitutional:  Positive for malaise/fatigue and weight loss. Negative for chills and fever.  HENT:  Negative for hearing loss, nosebleeds, sore throat and tinnitus.   Eyes:  Negative for blurred vision and  double vision.  Respiratory:  Positive for cough and sputum production. Negative for hemoptysis, shortness of breath and wheezing.   Cardiovascular:  Negative for chest pain, palpitations and leg swelling.  Gastrointestinal:  Negative for abdominal pain, blood in stool, constipation, diarrhea, melena, nausea and vomiting.  Genitourinary:  Negative for dysuria and urgency.  Musculoskeletal:  Negative for back pain, falls, joint pain and myalgias.  Skin:  Negative for itching and rash.  Neurological:  Positive for weakness. Negative for dizziness, tingling, sensory change, loss of consciousness and headaches.  Endo/Heme/Allergies:  Negative for environmental allergies. Does not bruise/bleed easily.  Psychiatric/Behavioral:  Negative for depression. The patient is not nervous/anxious and does not have insomnia.       Allergies  Allergen Reactions   Paclitaxel Shortness Of Breath   Ambien [Zolpidem]     Causes confusion    Nsaids     Abdominal pain   Aleve [Naproxen Sodium] Itching    Past Medical History:  Diagnosis Date   Cancer ([place])    Degenerative arthritis of knee    Depression    Diabetes mellitus without complication ([place])    Diverticulitis    Glaucoma    since 2004   Hernia 1979   History of kidney stones    Hypertension    Personal history of tobacco use, presenting hazards to health    Pre-diabetes    Screening for obesity    Special screening for malignant neoplasms, colon    Wears dentures    full upper and lower    Past Surgical History:  Procedure Laterality Date   [place], 2012   lumbar bulging disc   CATARACT EXTRACTION W/PHACO Left 01/20/2021   Procedure: CATARACT EXTRACTION PHACO AND INTRAOCULAR LENS PLACEMENT ([place]) LEFT;  Surgeon: Birder Robson, MD;  Location: [facility];  Service: Ophthalmology;  Laterality: Left;  10.13 0:52.1   COLONOSCOPY  2010,0712   [facility]/Dr. Buelah Manis sessile polyp,59m, found in rectum,multiple diverticula  were found in the sigmoid, in descending and in transverse colon   COLONOSCOPY WITH PROPOFOL N/A 10/22/2021   Procedure: COLONOSCOPY WITH PROPOFOL;  Surgeon: RAnnamaria Helling DO;  [facility];  Service: Gastroenterology;  Laterality: N/A;  DM   COLOSTOMY  04-20-13   HERNIA REPAIR  1979   inguinal   IR IMAGING GUIDED PORT INSERTION  06/10/2021   JOINT REPLACEMENT Right    knee   KNEE ARTHROPLASTY Right 05/08/2018   Procedure: COMPUTER ASSISTED TOTAL KNEE ARTHROPLASTY;  Surgeon: HDereck Leep MD;  Location: [facility];  Service: Orthopedics;  Laterality: Right;   KNEE ARTHROPLASTY Left 11/16/2019   Procedure: COMPUTER ASSISTED TOTAL KNEE ARTHROPLASTY;  Surgeon: HDereck Leep MD;  Location: [facility];  Service: Orthopedics;  Laterality: Left;   LAPAROTOMY  04-20-13   colon resection with colosotmy   LITHOTRIPSY  2013   TONSILLECTOMY AND ADENOIDECTOMY  1962    Social History   Socioeconomic History   Marital status: Married    Spouse name: Not on file   Number of children: Not on file   Years of education: Not on file   Highest education level: Not on file  Occupational History  Not on file  Tobacco Use   Smoking status: Every Day    Packs/day: 1.00    Years: 30.00    Pack years: 30.00    Types: Cigarettes   Smokeless tobacco: Never   Tobacco comments:    since age 25 or 30  Vaping Use   Vaping Use: Never used  Substance and Sexual Activity   Alcohol use: Yes    Alcohol/week: 2.0 standard drinks    Types: 2 Standard drinks or equivalent per week    Comment: 1-2 drinks per week   Drug use: Yes    Types: Marijuana   Sexual activity: Not on file  Other Topics Concern   Not on file  Social History Narrative   15 mins [place]; smoking; not much alcohol. Worked for [employer], 2019. Marland Kitchen    Social Determinants of Health   Financial Resource Strain: Not on file  Food Insecurity: Not on file  Transportation Needs: Not on file  Physical  Activity: Not on file  Stress: Not on file  Social Connections: Not on file  Intimate Partner Violence: Not on file    Family History  Problem Relation Age of Onset   Diabetes Mother    Heart disease Mother    Heart attack Father    Prostate cancer Father      Current Outpatient Medications:    acetaminophen (TYLENOL) 500 MG tablet, Take 2 tablets (1,000 mg total) by mouth daily as needed. Home med., Disp: 30 tablet, Rfl: 0   baclofen (LIORESAL) 10 MG tablet, Take 5 mg by mouth at bedtime as needed for muscle spasms., Disp: , Rfl:    blood glucose meter kit and supplies KIT, Check your blood glucose levels twice a day; once in the morning before breakfast; and once in the evening after dinner., Disp: 1 each, Rfl: 0   cetirizine (ZYRTEC) 10 MG tablet, Take 10 mg by mouth daily., Disp: , Rfl:    Cyanocobalamin (VITAMIN B-12 PO), Take by mouth daily., Disp: , Rfl:    dextromethorphan-guaiFENesin (MUCINEX DM) 30-600 MG 12hr tablet, Take 1 tablet by mouth 2 (two) times daily as needed for cough., Disp: , Rfl:    diphenhydramine-acetaminophen (TYLENOL PM) 25-500 MG TABS, Take 1-2 tablets by mouth at bedtime as needed (sleep.). , Disp: , Rfl:    dorzolamide-timolol (COSOPT) 22.3-6.8 MG/ML ophthalmic solution, Place 1 drop into both eyes 2 (two) times daily., Disp: , Rfl:    dronabinol (MARINOL) 5 MG capsule, Take 1 capsule (5 mg total) by mouth 2 (two) times daily before lunch and supper., Disp: 60 capsule, Rfl: 0   eszopiclone (LUNESTA) 1 MG TABS tablet, Take 1 tablet (1 mg total) by mouth at bedtime as needed for sleep. Take immediately before bedtime, Disp: 30 tablet, Rfl: 0   feeding supplement (ENSURE ENLIVE / ENSURE PLUS) LIQD, Take 237 mLs by mouth 3 (three) times daily between meals., Disp: , Rfl:    fentaNYL (DURAGESIC) 25 MCG/HR, Place 1 patch onto the skin every 3 (three) days., Disp: 5 patch, Rfl: 0   glipiZIDE (GLUCOTROL) 5 MG tablet, Take 1 tablet (5 mg total) by mouth 2 (two)  times daily before a meal., Disp: 60 tablet, Rfl: 3   glucose blood test strip, Check your blood glucose levels twice a day; once in the morning before breakfast; and once in the evening after dinner., Disp: 100 each, Rfl: 12   guaiFENesin-codeine 100-10 MG/5ML syrup, Take 5 mLs by mouth daily as needed  for cough., Disp: 120 mL, Rfl: 0   ipratropium (ATROVENT) 0.02 % nebulizer solution, Take 2.5 mLs (0.5 mg total) by nebulization 4 (four) times daily., Disp: 360 mL, Rfl: 3   ipratropium-albuterol (DUONEB) 0.5-2.5 (3) MG/3ML SOLN, Take 3 mLs by nebulization every 4 (four) hours as needed., Disp: 360 mL, Rfl: 3   Lancets (FREESTYLE) lancets, Check your blood glucose levels twice a day; once in the morning before breakfast; and once in the evening after dinner., Disp: 100 each, Rfl: 12   latanoprost (XALATAN) 0.005 % ophthalmic solution, Place 1 drop into both eyes at bedtime., Disp: , Rfl:    levalbuterol (XOPENEX) 0.63 MG/3ML nebulizer solution, Take 3 mLs (0.63 mg total) by nebulization every 6 (six) hours as needed for wheezing or shortness of breath., Disp: 28 mL, Rfl: 12   lidocaine-prilocaine (EMLA) cream, Apply 1 application. topically as needed., Disp: 30 g, Rfl: 3   metFORMIN (GLUCOPHAGE) 500 MG tablet, Take 1 tablet (500 mg total) by mouth 2 (two) times daily with a meal., Disp: 60 tablet, Rfl: 2   montelukast (SINGULAIR) 10 MG tablet, TAKE 1 TABLET(10 MG) BY MOUTH AT BEDTIME, Disp: 90 tablet, Rfl: 0   Multiple Vitamin (MULTIVITAMIN WITH MINERALS) TABS tablet, Take 1 tablet by mouth daily., Disp: , Rfl:    oxyCODONE (OXY IR/ROXICODONE) 5 MG immediate release tablet, Take 1 tablet (5 mg total) by mouth every 6 (six) hours as needed for severe pain., Disp: 60 tablet, Rfl: 0   potassium chloride SA (KLOR-CON M) 20 MEQ tablet, Take 1 tablet (20 mEq total) by mouth daily., Disp: 15 tablet, Rfl: 0   pregabalin (LYRICA) 50 MG capsule, TAKE 1 CAPSULE(50 MG) BY MOUTH TWICE DAILY, Disp: 60 capsule, Rfl:  1   propranolol ER (INDERAL LA) 60 MG 24 hr capsule, Take 1 capsule (60 mg total) by mouth daily., Disp: 30 capsule, Rfl: 3   Respiratory Therapy Supplies (NEBULIZER) DEVI, Use as directed, Disp: 1 each, Rfl: 0   sulfamethoxazole-trimethoprim (BACTRIM) 400-80 MG tablet, Take by mouth., Disp: , Rfl:    tamsulosin (FLOMAX) 0.4 MG CAPS capsule, Take 0.4 mg by mouth daily., Disp: , Rfl:    venlafaxine XR (EFFEXOR-XR) 75 MG 24 hr capsule, Take 225 mg by mouth daily., Disp: , Rfl:    WIXELA INHUB 500-50 MCG/ACT AEPB, INHALE 1 PUFF INTO THE LUNGS IN THE MORNING AND AT BEDTIME, Disp: 60 each, Rfl: 1 No current facility-administered medications for this visit.  Facility-Administered Medications Ordered in Other Visits:    heparin lock flush 100 UNIT/ML injection, , , ,    heparin lock flush 100 UNIT/ML injection, , , ,    heparin lock flush 100 UNIT/ML injection, , , ,    heparin lock flush 100 unit/mL, 500 Units, Intracatheter, Once PRN, [name], MD  Physical exam: There were no vitals filed for this visit. Physical Exam Constitutional:      Appearance: He is not ill-appearing.  HENT:     Mouth/Throat:     Mouth: Mucous membranes are dry.     Pharynx: No oropharyngeal exudate or posterior oropharyngeal erythema.  Cardiovascular:     Rate and Rhythm: Normal rate and regular rhythm.  Pulmonary:     Effort: Pulmonary effort is normal. No respiratory distress.     Comments: Diminished bilaterally Abdominal:     General: There is no distension.     Tenderness: There is no abdominal tenderness.  Musculoskeletal:        General: No  deformity.  Lymphadenopathy:     Cervical: No cervical adenopathy.  Skin:    General: Skin is dry.     Findings: No rash.  Neurological:     Mental Status: He is alert and oriented to person, place, and time.  Psychiatric:        Mood and Affect: Mood normal.        Behavior: Behavior normal.         Latest Ref Rng & Units 02/25/2022    8:35  AM  CMP  Glucose 70 - 99 mg/dL 296    BUN 8 - 23 mg/dL 8    Creatinine 0.61 - 1.24 mg/dL 0.98    Sodium 135 - 145 mmol/L 135    Potassium 3.5 - 5.1 mmol/L 3.4    Chloride 98 - 111 mmol/L 101    CO2 22 - 32 mmol/L 26    Calcium 8.9 - 10.3 mg/dL 8.7    Total Protein 6.5 - 8.1 g/dL 7.0    Total Bilirubin 0.3 - 1.2 mg/dL 0.3    Alkaline Phos 38 - 126 U/L 78    AST 15 - 41 U/L 22    ALT 0 - 44 U/L 11        Latest Ref Rng & Units 02/25/2022    8:35 AM  CBC  WBC 4.0 - 10.5 K/uL 8.1    Hemoglobin 13.0 - 17.0 g/dL 9.5    Hematocrit 39.0 - 52.0 % 29.5    Platelets 150 - 400 K/uL 255      No results found.  03/10/22- DG Chest 2 View -Thick-walled cavitation with an air-fluid level in the right upper lobe as before.  Extensive soft tissue thickening along the inferior margin in the perihilar right region has increased.  Possibly superimposed pneumonia versus progressive disease.  Assessment and plan- Patient is a 66 y.o. male diagnosed with lung cancer currently on Keytruda who presents to symptom management clinic for URI.  I independently reviewed imaging which does not show any obvious pneumonia however, limited due to cavitary lesion.  Possibility of superimposed pneumonia.  Discussed with Dr. Rogue Bussing.  Given his history including prior URI with hospitalization, he recommends empiric antibiotic therapy.  Prescription sent for Levaquin 500 mg daily.  We will also send prescription for Tussionex for cough.  Reviewed narcotic precautions.  He will monitor his temperature at home and if symptoms worsen he will notify clinic for reevaluation.  Otherwise, follow-up with Dr. Rogue Bussing as scheduled.   Visit Diagnosis 1. Cough in adult   2. Primary cancer of right upper lobe of lung ([place])   3. Community acquired pneumonia of right upper lobe of lung     Patient expressed understanding and was in agreement with this plan. He also understands that He can call clinic at any time with any  questions, concerns, or complaints.   Thank you for allowing me to participate in the care of this very pleasant patient.   Beckey Rutter, DNP, AGNP-C [place] at [employer]

## 2022-03-10 ENCOUNTER — Telehealth: Payer: Self-pay | Admitting: Nurse Practitioner

## 2022-03-10 ENCOUNTER — Other Ambulatory Visit: Payer: Self-pay | Admitting: Nurse Practitioner

## 2022-03-10 ENCOUNTER — Telehealth: Payer: Self-pay | Admitting: *Deleted

## 2022-03-10 ENCOUNTER — Other Ambulatory Visit: Payer: Self-pay | Admitting: Internal Medicine

## 2022-03-10 MED ORDER — BUPROPION HCL ER (XL) 150 MG PO TB24: 150.0000 mg | ORAL_TABLET | Freq: Every day | ORAL | 2 refills | Status: DC

## 2022-03-10 MED ORDER — LEVOFLOXACIN 500 MG PO TABS: 500.0000 mg | ORAL_TABLET | Freq: Every day | ORAL | 0 refills | Status: DC

## 2022-03-10 NOTE — Telephone Encounter (Signed)
Spoke to patient and his wife re: xray results. Dr. Rogue Bussing recommends levaquin 500 mg daily x 1 week. Patient agreeable. Prescription sent.

## 2022-03-10 NOTE — Telephone Encounter (Signed)
Wife called asking for results from cxr yesterday and is asking for return call from [place] DATA:  Cough, lung cancer, on Keytruda.   EXAM: CHEST - 2 VIEW   COMPARISON:  01/05/2022 and CT chest 12/21/2021.   FINDINGS: Trachea is deviated slightly to the right, as before. Left IJ power port tip is in the SVC. Thick-walled cavitation in the right upper lobe with an air-fluid level. The extent of soft tissue thickening inferiorly, in the right perihilar region, has increased from 01/05/2022. No airspace consolidation in the left lung. No pleural fluid.   IMPRESSION: Thick-walled cavitation with an air-fluid level in the right upper lobe, as before. The extent of soft tissue thickening along the inferior margin, in the right perihilar region, has increased in the interval. This may be due to superimposed pneumonia or progression of disease.     Electronically Signed   By: Lorin Picket M.D.   On: 03/10/2022 10:59

## 2022-03-10 NOTE — Telephone Encounter (Signed)
Called patient and reviewed results of xray. No obvious infection. He has picked up cough syrup but not yet tried it. No fevers, chills, or worsening symptoms. Recommend follow up with Dr. Rogue Bussing for [place] as planned. Plan for CT to assess response after cycle 3 as planned. In interim, manage cough symptomatically if symptoms aren't worsening. Patient agrees and will communicate with his wife.

## 2022-03-11 ENCOUNTER — Encounter: Payer: Self-pay | Admitting: Internal Medicine

## 2022-03-11 ENCOUNTER — Inpatient Hospital Stay: Payer: Medicare PPO

## 2022-03-11 VITALS — BP 108/59 | HR 72 | Temp 99.0°F | Resp 16 | Wt 175.5 lb

## 2022-03-11 DIAGNOSIS — Z5112 Encounter for antineoplastic immunotherapy: Secondary | ICD-10-CM | POA: Diagnosis not present

## 2022-03-11 DIAGNOSIS — C3411 Malignant neoplasm of upper lobe, right bronchus or lung: Secondary | ICD-10-CM

## 2022-03-11 MED ORDER — HEPARIN SOD (PORK) LOCK FLUSH 100 UNIT/ML IV SOLN
500.0000 [IU] | Freq: Once | INTRAVENOUS | Status: AC | PRN
  Administered 2022-03-11: 500 [IU]
  Filled 2022-03-11: qty 5

## 2022-03-11 MED ORDER — ONDANSETRON HCL 4 MG/2ML IJ SOLN
8.0000 mg | Freq: Once | INTRAMUSCULAR | Status: AC
  Administered 2022-03-11: 8 mg via INTRAVENOUS

## 2022-03-11 MED ORDER — SODIUM CHLORIDE 0.9 % IV SOLN: Freq: Once | INTRAVENOUS | Status: DC

## 2022-03-11 MED ORDER — SODIUM CHLORIDE 0.9 % IV SOLN
Freq: Once | INTRAVENOUS | Status: AC
  Filled 2022-03-11: qty 250

## 2022-03-11 MED ORDER — SODIUM CHLORIDE 0.9% FLUSH
10.0000 mL | Freq: Once | INTRAVENOUS | Status: AC | PRN
  Administered 2022-03-11: 10 mL
  Filled 2022-03-11: qty 10

## 2022-03-12 ENCOUNTER — Telehealth: Payer: Self-pay | Admitting: Internal Medicine

## 2022-03-12 NOTE — Telephone Encounter (Signed)
Pt needs xray results faxed over to [facility].

## 2022-03-12 NOTE — Telephone Encounter (Signed)
Faxed to   Ottie Glazier, MD  [facility] (Fax)       Pt's wife notified.

## 2022-03-16 ENCOUNTER — Encounter: Payer: Self-pay | Admitting: Internal Medicine

## 2022-03-16 ENCOUNTER — Inpatient Hospital Stay: Payer: Medicare PPO

## 2022-03-16 VITALS — BP 106/60 | HR 85 | Temp 97.8°F | Wt 173.8 lb

## 2022-03-16 DIAGNOSIS — Z5112 Encounter for antineoplastic immunotherapy: Secondary | ICD-10-CM | POA: Diagnosis not present

## 2022-03-16 DIAGNOSIS — C3411 Malignant neoplasm of upper lobe, right bronchus or lung: Secondary | ICD-10-CM

## 2022-03-16 MED ORDER — SODIUM CHLORIDE 0.9% FLUSH
10.0000 mL | Freq: Once | INTRAVENOUS | Status: AC | PRN
  Administered 2022-03-16: 10 mL
  Filled 2022-03-16: qty 10

## 2022-03-16 MED ORDER — SODIUM CHLORIDE 0.9 % IV SOLN
Freq: Once | INTRAVENOUS | Status: AC
  Filled 2022-03-16: qty 250

## 2022-03-16 MED ORDER — HEPARIN SOD (PORK) LOCK FLUSH 100 UNIT/ML IV SOLN
500.0000 [IU] | Freq: Once | INTRAVENOUS | Status: AC | PRN
  Administered 2022-03-16: 500 [IU]
  Filled 2022-03-16: qty 5

## 2022-03-18 ENCOUNTER — Other Ambulatory Visit: Payer: Self-pay

## 2022-03-18 ENCOUNTER — Inpatient Hospital Stay: Payer: Medicare PPO

## 2022-03-18 ENCOUNTER — Inpatient Hospital Stay
Admission: EM | Admit: 2022-03-18 | Discharge: 2022-03-20 | DRG: 189 | Disposition: A | Discharge status: Home Health | Payer: Medicare PPO | Attending: Internal Medicine | Admitting: Internal Medicine

## 2022-03-18 ENCOUNTER — Encounter: Payer: Self-pay | Admitting: Internal Medicine

## 2022-03-18 ENCOUNTER — Emergency Department: Payer: Medicare PPO

## 2022-03-18 ENCOUNTER — Inpatient Hospital Stay (HOSPITAL_BASED_OUTPATIENT_CLINIC_OR_DEPARTMENT_OTHER): Payer: Medicare PPO | Admitting: Internal Medicine

## 2022-03-18 ENCOUNTER — Ambulatory Visit: Payer: Medicare PPO

## 2022-03-18 VITALS — BP 117/62 | HR 95 | Temp 98.8°F | Ht 71.0 in | Wt 173.0 lb

## 2022-03-18 DIAGNOSIS — Z833 Family history of diabetes mellitus: Secondary | ICD-10-CM

## 2022-03-18 DIAGNOSIS — R0902 Hypoxemia: Secondary | ICD-10-CM

## 2022-03-18 DIAGNOSIS — Z5112 Encounter for antineoplastic immunotherapy: Secondary | ICD-10-CM | POA: Diagnosis not present

## 2022-03-18 DIAGNOSIS — Z9842 Cataract extraction status, left eye: Secondary | ICD-10-CM

## 2022-03-18 DIAGNOSIS — H409 Unspecified glaucoma: Secondary | ICD-10-CM | POA: Diagnosis present

## 2022-03-18 DIAGNOSIS — I10 Essential (primary) hypertension: Secondary | ICD-10-CM | POA: Diagnosis present

## 2022-03-18 DIAGNOSIS — C3411 Malignant neoplasm of upper lobe, right bronchus or lung: Secondary | ICD-10-CM | POA: Diagnosis present

## 2022-03-18 DIAGNOSIS — C180 Malignant neoplasm of cecum: Secondary | ICD-10-CM

## 2022-03-18 DIAGNOSIS — F17210 Nicotine dependence, cigarettes, uncomplicated: Secondary | ICD-10-CM | POA: Diagnosis present

## 2022-03-18 DIAGNOSIS — J189 Pneumonia, unspecified organism: Secondary | ICD-10-CM | POA: Diagnosis present

## 2022-03-18 DIAGNOSIS — J9601 Acute respiratory failure with hypoxia: Secondary | ICD-10-CM | POA: Diagnosis present

## 2022-03-18 DIAGNOSIS — Z933 Colostomy status: Secondary | ICD-10-CM

## 2022-03-18 DIAGNOSIS — J9621 Acute and chronic respiratory failure with hypoxia: Secondary | ICD-10-CM | POA: Diagnosis not present

## 2022-03-18 DIAGNOSIS — T380X5A Adverse effect of glucocorticoids and synthetic analogues, initial encounter: Secondary | ICD-10-CM | POA: Diagnosis not present

## 2022-03-18 DIAGNOSIS — F329 Major depressive disorder, single episode, unspecified: Secondary | ICD-10-CM | POA: Diagnosis present

## 2022-03-18 DIAGNOSIS — Z79899 Other long term (current) drug therapy: Secondary | ICD-10-CM

## 2022-03-18 DIAGNOSIS — Z961 Presence of intraocular lens: Secondary | ICD-10-CM | POA: Diagnosis present

## 2022-03-18 DIAGNOSIS — J440 Chronic obstructive pulmonary disease with acute lower respiratory infection: Secondary | ICD-10-CM | POA: Diagnosis present

## 2022-03-18 DIAGNOSIS — Z87442 Personal history of urinary calculi: Secondary | ICD-10-CM

## 2022-03-18 DIAGNOSIS — Z85038 Personal history of other malignant neoplasm of large intestine: Secondary | ICD-10-CM

## 2022-03-18 DIAGNOSIS — J441 Chronic obstructive pulmonary disease with (acute) exacerbation: Secondary | ICD-10-CM | POA: Diagnosis present

## 2022-03-18 DIAGNOSIS — I248 Other forms of acute ischemic heart disease: Secondary | ICD-10-CM | POA: Diagnosis present

## 2022-03-18 DIAGNOSIS — Y92239 Unspecified place in hospital as the place of occurrence of the external cause: Secondary | ICD-10-CM | POA: Diagnosis not present

## 2022-03-18 DIAGNOSIS — Z923 Personal history of irradiation: Secondary | ICD-10-CM

## 2022-03-18 DIAGNOSIS — J449 Chronic obstructive pulmonary disease, unspecified: Secondary | ICD-10-CM | POA: Diagnosis present

## 2022-03-18 DIAGNOSIS — Z96653 Presence of artificial knee joint, bilateral: Secondary | ICD-10-CM | POA: Diagnosis present

## 2022-03-18 DIAGNOSIS — R0602 Shortness of breath: Secondary | ICD-10-CM | POA: Diagnosis not present

## 2022-03-18 DIAGNOSIS — Z8249 Family history of ischemic heart disease and other diseases of the circulatory system: Secondary | ICD-10-CM

## 2022-03-18 DIAGNOSIS — E1165 Type 2 diabetes mellitus with hyperglycemia: Secondary | ICD-10-CM | POA: Diagnosis not present

## 2022-03-18 DIAGNOSIS — Z8042 Family history of malignant neoplasm of prostate: Secondary | ICD-10-CM

## 2022-03-18 DIAGNOSIS — D630 Anemia in neoplastic disease: Secondary | ICD-10-CM | POA: Diagnosis present

## 2022-03-18 DIAGNOSIS — E119 Type 2 diabetes mellitus without complications: Secondary | ICD-10-CM

## 2022-03-18 DIAGNOSIS — Z20822 Contact with and (suspected) exposure to covid-19: Secondary | ICD-10-CM | POA: Diagnosis present

## 2022-03-18 LAB — CBC WITH DIFFERENTIAL/PLATELET
Abs Immature Granulocytes: 0.04 10*3/uL (ref 0.00–0.07)
Abs Immature Granulocytes: 0.04 10*3/uL (ref 0.00–0.07)
Basophils Absolute: 0.1 10*3/uL (ref 0.0–0.1)
Basophils Absolute: 0 10*3/uL (ref 0.0–0.1)
Basophils Relative: 1 %
Basophils Relative: 0 %
Eosinophils Absolute: 0.3 10*3/uL (ref 0.0–0.5)
Eosinophils Absolute: 0.2 10*3/uL (ref 0.0–0.5)
Eosinophils Relative: 3 %
Eosinophils Relative: 2 %
HCT: 28.8 % — ABNORMAL LOW (ref 39.0–52.0)
HCT: 27.5 % — ABNORMAL LOW (ref 39.0–52.0)
Hemoglobin: 9 g/dL — ABNORMAL LOW (ref 13.0–17.0)
Hemoglobin: 8.5 g/dL — ABNORMAL LOW (ref 13.0–17.0)
Immature Granulocytes: 0 %
Immature Granulocytes: 0 %
Lymphocytes Relative: 7 %
Lymphocytes Relative: 10 %
Lymphs Abs: 0.7 10*3/uL (ref 0.7–4.0)
Lymphs Abs: 0.9 10*3/uL (ref 0.7–4.0)
MCH: 28 pg (ref 26.0–34.0)
MCH: 27.5 pg (ref 26.0–34.0)
MCHC: 31.3 g/dL (ref 30.0–36.0)
MCHC: 30.9 g/dL (ref 30.0–36.0)
MCV: 89.4 fL (ref 80.0–100.0)
MCV: 89 fL (ref 80.0–100.0)
Monocytes Absolute: 0.7 10*3/uL (ref 0.1–1.0)
Monocytes Absolute: 0.7 10*3/uL (ref 0.1–1.0)
Monocytes Relative: 7 %
Monocytes Relative: 8 %
Neutro Abs: 7.8 10*3/uL — ABNORMAL HIGH (ref 1.7–7.7)
Neutro Abs: 7.2 10*3/uL (ref 1.7–7.7)
Neutrophils Relative %: 82 %
Neutrophils Relative %: 80 %
Platelets: 227 10*3/uL (ref 150–400)
Platelets: 223 10*3/uL (ref 150–400)
RBC: 3.22 MIL/uL — ABNORMAL LOW (ref 4.22–5.81)
RBC: 3.09 MIL/uL — ABNORMAL LOW (ref 4.22–5.81)
RDW: 14.9 % (ref 11.5–15.5)
RDW: 15.1 % (ref 11.5–15.5)
WBC: 9.5 10*3/uL (ref 4.0–10.5)
WBC: 9.1 10*3/uL (ref 4.0–10.5)
nRBC: 0 % (ref 0.0–0.2)
nRBC: 0 % (ref 0.0–0.2)

## 2022-03-18 LAB — COMPREHENSIVE METABOLIC PANEL
ALT: 12 U/L (ref 0–44)
ALT: 12 U/L (ref 0–44)
AST: 22 U/L (ref 15–41)
AST: 25 U/L (ref 15–41)
Albumin: 2.5 g/dL — ABNORMAL LOW (ref 3.5–5.0)
Albumin: 2.3 g/dL — ABNORMAL LOW (ref 3.5–5.0)
Alkaline Phosphatase: 94 U/L (ref 38–126)
Alkaline Phosphatase: 84 U/L (ref 38–126)
Anion gap: 10 (ref 5–15)
Anion gap: 8 (ref 5–15)
BUN: 9 mg/dL (ref 8–23)
BUN: 8 mg/dL (ref 8–23)
CO2: 25 mmol/L (ref 22–32)
CO2: 25 mmol/L (ref 22–32)
Calcium: 8.3 mg/dL — ABNORMAL LOW (ref 8.9–10.3)
Calcium: 8.3 mg/dL — ABNORMAL LOW (ref 8.9–10.3)
Chloride: 101 mmol/L (ref 98–111)
Chloride: 106 mmol/L (ref 98–111)
Creatinine, Ser: 0.88 mg/dL (ref 0.61–1.24)
Creatinine, Ser: 0.69 mg/dL (ref 0.61–1.24)
GFR, Estimated: >60 mL/min (ref >60)
GFR, Estimated: >60 mL/min (ref >60)
Glucose, Bld: 214 mg/dL — ABNORMAL HIGH (ref 70–99)
Glucose, Bld: 114 mg/dL — ABNORMAL HIGH (ref 70–99)
Potassium: 3.4 mmol/L — ABNORMAL LOW (ref 3.5–5.1)
Potassium: 4.2 mmol/L (ref 3.5–5.1)
Sodium: 136 mmol/L (ref 135–145)
Sodium: 139 mmol/L (ref 135–145)
Total Bilirubin: 0.2 mg/dL — ABNORMAL LOW (ref 0.3–1.2)
Total Bilirubin: 0.8 mg/dL (ref 0.3–1.2)
Total Protein: 6.5 g/dL (ref 6.5–8.1)
Total Protein: 6.3 g/dL — ABNORMAL LOW (ref 6.5–8.1)

## 2022-03-18 LAB — TROPONIN I (HIGH SENSITIVITY): Troponin I (High Sensitivity): 15 ng/L (ref <18)

## 2022-03-18 LAB — BRAIN NATRIURETIC PEPTIDE: B Natriuretic Peptide: 193.2 pg/mL — ABNORMAL HIGH (ref 0.0–100.0)

## 2022-03-18 LAB — TSH: TSH: 2.814 u[IU]/mL (ref 0.350–4.500)

## 2022-03-18 MED ORDER — SODIUM CHLORIDE 0.9 % IV SOLN
200.0000 mg | Freq: Once | INTRAVENOUS | Status: AC
  Administered 2022-03-18: 200 mg via INTRAVENOUS
  Filled 2022-03-18: qty 8

## 2022-03-18 MED ORDER — HEPARIN SOD (PORK) LOCK FLUSH 100 UNIT/ML IV SOLN
INTRAVENOUS | Status: AC
  Filled 2022-03-18: qty 5

## 2022-03-18 MED ORDER — SODIUM CHLORIDE 0.9 % IV SOLN
Freq: Once | INTRAVENOUS | Status: AC
  Filled 2022-03-18: qty 250

## 2022-03-18 MED ORDER — OXYCODONE HCL 5 MG PO TABS: 5.0000 mg | ORAL_TABLET | Freq: Four times a day (QID) | ORAL | 0 refills | Status: DC | PRN

## 2022-03-18 MED ORDER — IOHEXOL 350 MG/ML SOLN
75.0000 mL | Freq: Once | INTRAVENOUS | Status: AC | PRN
  Administered 2022-03-18: 75 mL via INTRAVENOUS

## 2022-03-18 MED ORDER — DRONABINOL 5 MG PO CAPS: 5.0000 mg | ORAL_CAPSULE | Freq: Two times a day (BID) | ORAL | 1 refills | Status: DC

## 2022-03-18 MED ORDER — SODIUM CHLORIDE 0.9% FLUSH
10.0000 mL | Freq: Once | INTRAVENOUS | Status: AC
  Administered 2022-03-18: 10 mL via INTRAVENOUS
  Filled 2022-03-18: qty 10

## 2022-03-18 MED ORDER — HEPARIN SOD (PORK) LOCK FLUSH 100 UNIT/ML IV SOLN
500.0000 [IU] | Freq: Once | INTRAVENOUS | Status: AC | PRN
  Administered 2022-03-18: 500 [IU]
  Filled 2022-03-18: qty 5

## 2022-03-18 MED ORDER — IPRATROPIUM-ALBUTEROL 0.5-2.5 (3) MG/3ML IN SOLN
3.0000 mL | Freq: Once | RESPIRATORY_TRACT | Status: AC
  Administered 2022-03-18: 3 mL via RESPIRATORY_TRACT
  Filled 2022-03-18: qty 3

## 2022-03-18 NOTE — Patient Instructions (Signed)
[facility] AT [place]  Discharge Instructions: Thank you for choosing [facility] to provide your oncology and hematology care.  If you have a lab appointment with the [facility], please go directly to the [facility] and check in at the registration area.  Wear comfortable clothing and clothing appropriate for easy access to any Portacath or PICC line.   We strive to give you quality time with your provider. You may need to reschedule your appointment if you arrive late (15 or more minutes).  Arriving late affects you and other patients whose appointments are after yours.  Also, if you miss three or more appointments without notifying the office, you may be dismissed from the clinic at the provider's discretion.      For prescription refill requests, have your pharmacy contact our office and allow 72 hours for refills to be completed.    Today you received the following chemotherapy and/or immunotherapy agents Rodney Vega   To help prevent nausea and vomiting after your treatment, we encourage you to take your nausea medication as directed.  BELOW ARE SYMPTOMS THAT SHOULD BE REPORTED IMMEDIATELY: *FEVER GREATER THAN 100.4 F (38 C) OR HIGHER *CHILLS OR SWEATING *NAUSEA AND VOMITING THAT IS NOT CONTROLLED WITH YOUR NAUSEA MEDICATION *UNUSUAL SHORTNESS OF BREATH *UNUSUAL BRUISING OR BLEEDING *URINARY PROBLEMS (pain or burning when urinating, or frequent urination) *BOWEL PROBLEMS (unusual diarrhea, constipation, pain near the anus) TENDERNESS IN MOUTH AND THROAT WITH OR WITHOUT PRESENCE OF ULCERS (sore throat, sores in mouth, or a toothache) UNUSUAL RASH, SWELLING OR PAIN  UNUSUAL VAGINAL DISCHARGE OR ITCHING   Items with * indicate a potential emergency and should be followed up as soon as possible or go to the Emergency Department if any problems should occur.  Please show the CHEMOTHERAPY ALERT CARD or IMMUNOTHERAPY ALERT CARD at check-in to the  Emergency Department and triage nurse.  Should you have questions after your visit or need to cancel or reschedule your appointment, please contact [facility] [place] AT [place]  [phone] and follow the prompts.  Office hours are 8:00 a.m. to 4:30 p.m. Monday - Friday. Please note that voicemails left after 4:00 p.m. may not be returned until the following business day.  We are closed weekends and major holidays. You have access to a nurse at all times for urgent questions. Please call the main number to the clinic [phone] and follow the prompts.  For any non-urgent questions, you may also contact your provider using MyChart. We now offer e-Visits for anyone 51 and older to request care online for non-urgent symptoms. For details visit [email or web address].   Also download the MyChart app! Go to the app store, search "MyChart", open the app, select [facility], and log in with your MyChart username and password.  Masks are optional in the cancer centers. If you would like for your care team to wear a mask while they are taking care of you, please let them know. For doctor visits, patients may have with them one support person who is at least 67 years old. At this time, visitors are not allowed in the infusion area.

## 2022-03-18 NOTE — Progress Notes (Signed)
Wife states having a hard time breathing this morning.   No taste or appetite. Sleeping more x4-5 days.   Has concerns about where Dr. Donella Stade did the radiation.

## 2022-03-18 NOTE — Progress Notes (Signed)
Nutrition Follow-up:  Patient with lung cancer.  Receiving [place].    Met with patient during infusion.  Patient reports that everything taste "salty".  Says that appetite is up and down.  "I am trying to eat all I can."     Medications: marinol  Labs: K 3.4, glucose 214  Anthropometrics:   Weight 173 lb today  178 lb 12.8 oz on 5/16 180 lb on 4/18 174 lb on 3/30 169 lb on 3/14    NUTRITION DIAGNOSIS: Inadequate oral intake continues   INTERVENTION:  Encouraged good oral care.   Discussed strategies to help with taste change.   Continue appetite stimulant    MONITORING, EVALUATION, GOAL: weight trends, intake   NEXT VISIT: as needed  Rodney Vega, [place], [place] Registered Dietitian [phone]

## 2022-03-18 NOTE — Assessment & Plan Note (Addendum)
#  UNRESECTABLE/LOCALLY ADVANCED- Right upper lobe lung mass -concerning for malignancy [biopsy inconclusive/atypical cells]-   carbo Abraxane with radiation [finished end of Oct 2022]. MARCH 2023-CT scan shows Necrotic mass of the right upper lobe; improvement of the pneumonic process. April 2023- PD-L1 positive [cirucologene].  # Proceed with [name] #3 ;Labs today reviewed;  acceptable for treatment today. Plan re-imaging after 3 cycles of treatment.  CT scan ordered.  #Right chest wall pain-/pleuritic- ON  fenatny patch 25 mcg.  Oxycodone 5 mg every 6 hours as needed [3-4 a day]-STABLE.   # COPD/fatigue- [coughing/phlegm/ no fevers]- on albuterol/advair [smoking]- TSH- WNL;Continue  Xopenex/ipratropium nebs-; Mucinex- DM BID.  Awaiting re- evaluation with pulmonary.  Reviewed the chest x-ray-infectious versus progressive malignancy..  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 Bary Castilla. Hold surgery for now-especially context of his incurable lung malignancy.  Await CT scan.  # Poorly controlled diabetes-blood sugars 113- 280;  Continue glipizide; and  Metformin. Monitor for now.   # IV mediport: functioning/STABLE  *appt thru mychart    # DISPOSITION: # add TSH to labs today  # Keytruda today; ADD IVFs over 1 hour.   # in 1 week- IVFs-1 lit /1 hourTuesdays/Thursday   # in 2 weeks-  IVFs-1 lit /1 hourTuesdays/Thursday   # follow up in 3 weeks-MD;- labs- cbc/cmp;25 OH vit D levels- Keytruda; IVFs over 1 hour; CT scan CAP-  Dr.B

## 2022-03-18 NOTE — Progress Notes (Signed)
[place] NOTE  Patient Care Team: [name], MD as PCP - General (Family Medicine) [name], RN as Oncology Nurse Navigator [name], MD as Consulting Physician (Urology) [name], MD as Consulting Physician (Oncology)  CHIEF COMPLAINTS/PURPOSE OF CONSULTATION: lung cancer    Oncology History Overview Note  AUG 2022- 8.2 x 5.9 cm spiculated mass noted in right upper lobe consistent with malignancy. It extends from the right hilum to the lateral chest wall and results in lytic destruction of the lateral portion of the right fourth rib.  Mediastinal lymph nodes are noted, including 12 mm right hilar lymph node, consistent with metastatic disease. 3 cm right adrenal mass is noted concerning for metastatic disease.  # AUG 2022- PET IMPRESSION: Peripherally hypermetabolic right upper lobe mass, likely due to central necrosis. Mass extends into the right hilum and right lateral chest wall involving the second through fourth right ribs.   Mildly enlarged and hypermetabolic right hilar lymph node.   No evidence of metastatic disease in the abdomen or pelvis.  # AUG, 2022-RIGHT CHEST WALL Bx-necrosis; suspicious for malignancy not definitive [discussed with Dr.Kraynie;MDT] LUNG MASS, RIGHT; BIOPSY:  - SUSPICIOUS FOR MALIGNANCY.  - SEE COMMENT.   Comment:  Approximately six tissue cores demonstrate inflamed fibrous capsule with  hemosiderin-laden macrophages, in a background of abundant necrosis.  One of the cores demonstrates a minute fragment of viable epithelial  cells.  These cells are positive for p40.  They are negative for TTF1  and CK7. The cells of interest disappear on deeper cut levels.  While  the findings are suspicious for squamous cell carcinoma, there is not  enough viable tumor for a definitive diagnosis.  Additional tissue  sampling may be helpful.   # SEP 2022-cycle #2 Taxol hypersensitive reaction.  Switched  over to #3 cycle- carbo-Abraxane starting 06/29/2021  # DEC 2022- s/p ID- Dr.ravishankar- ? Lung abscess-no antibiotics-  JAN 2023- CT scan incidental- cecal mass- colonoscopy[Dr.Russo; [facility]- high grade dysplasia]  S/p evaluation with Dr. [name] surgery for now]   # S/p evaluation with Dr. [name].   Primary cancer of right upper lobe of lung ([place])  06/03/2021 Initial Diagnosis   Primary cancer of right upper lobe of lung ([place])   06/03/2021 Cancer Staging   Staging form: Lung, AJCC 8th Edition - Clinical: Stage IIIB (cT3, cN2, cM0) - Signed by [name], MD on 06/03/2021   06/15/2021 - 08/03/2021 Chemotherapy   Patient is on Treatment Plan : LUNG Carboplatin / Paclitaxel + XRT q7d     10/02/2021 - 10/19/2021 Chemotherapy   Patient is on Treatment Plan : LUNG Durvalumab q14d     02/04/2022 -  Chemotherapy   Patient is on Treatment Plan : LUNG NSCLC Pembrolizumab (200) q21d        HISTORY OF PRESENTING ILLNESS: Patient is ambulating independently.  Accompanied by wife.  Rodney Vega 66 y.o.  male history of smoking -"lung cancer" [Limited necrotic tissue]-locally advanced unresectable currently on single agent Keytruda.  Patient interim was evaluated by symptom management clinic for worsening shortness of breath-patient s/p antibiotics.   Wife states having a hard time breathing this morning. No taste or appetite. Sleeping more x4-5 days.   Continues to complain of chest pain right chest wall status post coughing.  He is needing oxycodone 3-4 times daily.    Complains of poor taste.  Review of Systems  Constitutional:  Positive for malaise/fatigue and weight loss. Negative for chills,  diaphoresis and fever.  HENT:  Negative for nosebleeds and sore throat.   Eyes:  Negative for double vision.  Respiratory:  Positive for cough, sputum production and shortness of breath. Negative for hemoptysis and wheezing.   Cardiovascular:  Positive for chest pain. Negative for  palpitations, orthopnea and leg swelling.  Gastrointestinal:  Positive for nausea. Negative for abdominal pain, blood in stool, constipation, diarrhea, heartburn, melena and vomiting.  Genitourinary:  Negative for dysuria, frequency and urgency.  Musculoskeletal:  Negative for back pain and joint pain.  Skin: Negative.  Negative for itching and rash.  Neurological:  Positive for tingling. Negative for dizziness, focal weakness, weakness and headaches.  Endo/Heme/Allergies:  Does not bruise/bleed easily.  Psychiatric/Behavioral:  Negative for depression. The patient is not nervous/anxious and does not have insomnia.      MEDICAL HISTORY:  Past Medical History:  Diagnosis Date  . Cancer ([place])   . Degenerative arthritis of knee   . Depression   . Diabetes mellitus without complication ([place])   . Diverticulitis   . Glaucoma    since 2004  . Hernia 1979  . History of kidney stones   . Hypertension   . Personal history of tobacco use, presenting hazards to health   . Pre-diabetes   . Screening for obesity   . Special screening for malignant neoplasms, colon   . Wears dentures    full upper and lower    SURGICAL HISTORY: Past Surgical History:  Procedure Laterality Date  . [place], 2012   lumbar bulging disc  . CATARACT EXTRACTION W/PHACO Left 01/20/2021   Procedure: CATARACT EXTRACTION PHACO AND INTRAOCULAR LENS PLACEMENT ([place]) LEFT;  Surgeon: Birder Robson, MD;  Location: [facility];  Service: Ophthalmology;  Laterality: Left;  10.13 0:52.1  . COLONOSCOPY  [id number]   [facility]/Dr. Buelah Manis sessile polyp,26m, found in rectum,multiple diverticula were found in the sigmoid, in descending and in transverse colon  . COLONOSCOPY WITH PROPOFOL N/A 10/22/2021   Procedure: COLONOSCOPY WITH PROPOFOL;  Surgeon: RAnnamaria Helling DO;  [facility];  Service: Gastroenterology;  Laterality: N/A;  DM  . COLOSTOMY  04-20-13  . HERNIA REPAIR  1979   inguinal  .  IR IMAGING GUIDED PORT INSERTION  06/10/2021  . JOINT REPLACEMENT Right    knee  . KNEE ARTHROPLASTY Right 05/08/2018   Procedure: COMPUTER ASSISTED TOTAL KNEE ARTHROPLASTY;  Surgeon: HDereck Leep MD;  Location: [facility];  Service: Orthopedics;  Laterality: Right;  . KNEE ARTHROPLASTY Left 11/16/2019   Procedure: COMPUTER ASSISTED TOTAL KNEE ARTHROPLASTY;  Surgeon: HDereck Leep MD;  Location: [facility];  Service: Orthopedics;  Laterality: Left;  . LAPAROTOMY  04-20-13   colon resection with colosotmy  . LITHOTRIPSY  2013  . TONSILLECTOMY AND ADENOIDECTOMY  1962    SOCIAL HISTORY: Social History   Socioeconomic History  . Marital status: Married    Spouse name: Not on file  . Number of children: Not on file  . Years of education: Not on file  . Highest education level: Not on file  Occupational History  . Not on file  Tobacco Use  . Smoking status: Every Day    Packs/day: 1.00    Years: 30.00    Total pack years: 30.00    Types: Cigarettes  . Smokeless tobacco: Never  . Tobacco comments:    since age [age] Vaping Use  . Vaping Use: Never used  Substance and Sexual Activity  . Alcohol  use: Yes    Alcohol/week: 2.0 standard drinks of alcohol    Types: 2 Standard drinks or equivalent per week    Comment: 1-2 drinks per week  . Drug use: Yes    Types: Marijuana  . Sexual activity: Not on file  Other Topics Concern  . Not on file  Social History Narrative   15 mins [place]; smoking; not much alcohol. Worked for [employer], 2019. 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 Diabetes Mother   . Heart disease Mother   . Heart attack Father   .  Prostate cancer Father     ALLERGIES:  is allergic to paclitaxel, ambien [zolpidem], nsaids, and aleve [naproxen sodium].  MEDICATIONS:  Current Outpatient Medications  Medication Sig Dispense Refill  . acetaminophen (TYLENOL) 500 MG tablet Take 2 tablets (1,000 mg total) by mouth daily as needed. Home med. 30 tablet 0  . baclofen (LIORESAL) 10 MG tablet Take 5 mg by mouth at bedtime as needed for muscle spasms.    . blood glucose meter kit and supplies KIT Check your blood glucose levels twice a day; once in the morning before breakfast; and once in the evening after dinner. 1 each 0  . buPROPion (WELLBUTRIN XL) 150 MG 24 hr tablet Take 1 tablet (150 mg total) by mouth daily. 30 tablet 2  . cetirizine (ZYRTEC) 10 MG tablet Take 10 mg by mouth daily.    . chlorpheniramine-HYDROcodone 10-8 MG/5ML Take 5 mLs by mouth at bedtime as needed for cough. 140 mL 0  . Cyanocobalamin (VITAMIN B-12 PO) Take by mouth daily.    Marland Kitchen dextromethorphan-guaiFENesin (MUCINEX DM) 30-600 MG 12hr tablet Take 1 tablet by mouth 2 (two) times daily as needed for cough.    . diphenhydramine-acetaminophen (TYLENOL PM) 25-500 MG TABS Take 1-2 tablets by mouth at bedtime as needed (sleep.).     Marland Kitchen dorzolamide-timolol (COSOPT) 22.3-6.8 MG/ML ophthalmic solution Place 1 drop into both eyes 2 (two) times daily.    . eszopiclone (LUNESTA) 1 MG TABS tablet Take 1 tablet (1 mg total) by mouth at bedtime as needed for sleep. Take immediately before bedtime 30 tablet 0  . feeding supplement (ENSURE ENLIVE / ENSURE PLUS) LIQD Take 237 mLs by mouth 3 (three) times daily between meals.    . fentaNYL (DURAGESIC) 25 MCG/HR Place 1 patch onto the skin every 3 (three) days. 5 patch 0  . glipiZIDE (GLUCOTROL) 5 MG tablet Take 1 tablet (5 mg total) by mouth 2 (two) times daily before a meal. 60 tablet 3  . glucose blood test strip Check your blood glucose levels twice a day; once in the morning before breakfast; and once in the evening after  dinner. 100 each 12  . guaiFENesin-codeine 100-10 MG/5ML syrup Take 5 mLs by mouth daily as needed for cough. 120 mL 0  . ipratropium (ATROVENT) 0.02 % nebulizer solution Take 2.5 mLs (0.5 mg total) by nebulization 4 (four) times daily. 360 mL 3  . ipratropium-albuterol (DUONEB) 0.5-2.5 (3) MG/3ML SOLN Take 3 mLs by nebulization every 4 (four) hours as needed. 360 mL 3  . Lancets (FREESTYLE) lancets Check your  blood glucose levels twice a day; once in the morning before breakfast; and once in the evening after dinner. 100 each 12  . latanoprost (XALATAN) 0.005 % ophthalmic solution Place 1 drop into both eyes at bedtime.    . lidocaine-prilocaine (EMLA) cream Apply 1 application. topically as needed. 30 g 3  . metFORMIN (GLUCOPHAGE) 500 MG tablet Take 1 tablet (500 mg total) by mouth 2 (two) times daily with a meal. 60 tablet 2  . montelukast (SINGULAIR) 10 MG tablet TAKE 1 TABLET(10 MG) BY MOUTH AT BEDTIME 90 tablet 0  . Multiple Vitamin (MULTIVITAMIN WITH MINERALS) TABS tablet Take 1 tablet by mouth daily.    . potassium chloride SA (KLOR-CON M) 20 MEQ tablet Take 1 tablet (20 mEq total) by mouth daily. 15 tablet 0  . pregabalin (LYRICA) 50 MG capsule TAKE 1 CAPSULE(50 MG) BY MOUTH TWICE DAILY 60 capsule 1  . propranolol ER (INDERAL LA) 60 MG 24 hr capsule Take 1 capsule (60 mg total) by mouth daily. 30 capsule 3  . Respiratory Therapy Supplies (NEBULIZER) DEVI Use as directed 1 each 0  . tamsulosin (FLOMAX) 0.4 MG CAPS capsule Take 0.4 mg by mouth daily.    Marland Kitchen venlafaxine XR (EFFEXOR-XR) 75 MG 24 hr capsule Take 225 mg by mouth daily.    Grant Ruts INHUB 500-50 MCG/ACT AEPB INHALE 1 PUFF INTO THE LUNGS IN THE MORNING AND AT BEDTIME 60 each 1  . dronabinol (MARINOL) 5 MG capsule Take 1 capsule (5 mg total) by mouth 2 (two) times daily before lunch and supper. 60 capsule 1  . levalbuterol (XOPENEX) 0.63 MG/3ML nebulizer solution Take 3 mLs (0.63 mg total) by nebulization every 6 (six) hours as needed  for wheezing or shortness of breath. 28 mL 12  . oxyCODONE (OXY IR/ROXICODONE) 5 MG immediate release tablet Take 1 tablet (5 mg total) by mouth every 6 (six) hours as needed for severe pain. 90 tablet 0   No current facility-administered medications for this visit.   Facility-Administered Medications Ordered in Other Visits  Medication Dose Route Frequency Provider Last Rate Last Admin  . 0.9 %  sodium chloride infusion   Intravenous Once Charlaine Dalton R, MD      . heparin lock flush 100 UNIT/ML injection           . heparin lock flush 100 UNIT/ML injection           . heparin lock flush 100 UNIT/ML injection               .  PHYSICAL EXAMINATION: ECOG PERFORMANCE STATUS: 1 - Symptomatic but completely ambulatory  Vitals:   03/18/22 0844  BP: 117/62  Pulse: 95  Temp: 98.8 F (37.1 C)  SpO2: 96%     Filed Weights   03/18/22 0844  Weight: 173 lb (78.5 kg)      Physical Exam Vitals and nursing note reviewed.  HENT:     Head: Normocephalic and atraumatic.     Mouth/Throat:     Pharynx: Oropharynx is clear.  Eyes:     Extraocular Movements: Extraocular movements intact.     Pupils: Pupils are equal, round, and reactive to light.  Cardiovascular:     Rate and Rhythm: Normal rate and regular rhythm.  Pulmonary:     Comments: Decreased breath sounds bilaterally.  Abdominal:     Palpations: Abdomen is soft.  Musculoskeletal:        General: Normal range of motion.     Cervical  back: Normal range of motion.  Skin:    General: Skin is warm.  Neurological:     General: No focal deficit present.     Mental Status: He is alert and oriented to person, place, and time.  Psychiatric:        Behavior: Behavior normal.        Judgment: Judgment normal.     LABORATORY DATA:  I have reviewed the data as listed Lab Results  Component Value Date   WBC 9.5 03/18/2022   HGB 9.0 (L) 03/18/2022   HCT 28.8 (L) 03/18/2022   MCV 89.4 03/18/2022   PLT 227 03/18/2022    Recent Labs    02/04/22 0845 02/25/22 0835 03/18/22 0845  NA 135 135 136  K 3.6 3.4* 3.4*  CL 100 101 101  CO2 _0 GLUCOSE 244* 296* 214*  BUN _1 CREATININE 0.82 0.98 0.88  CALCIUM 8.7* 8.7* 8.3*  GFRNONAA >60 >60 >60  PROT 6.9 7.0 6.5  ALBUMIN 2.9* 2.9* 2.5*  AST _2 ALT _3 ALKPHOS 90 78 94  BILITOT 0.4 0.3 0.2*    RADIOGRAPHIC STUDIES: I have personally reviewed the radiological images as listed and agreed with the findings in the report. DG Chest 2 View  Result Date: 03/10/2022 CLINICAL DATA:  Cough, lung cancer, on Keytruda. EXAM: CHEST - 2 VIEW COMPARISON:  01/05/2022 and CT chest 12/21/2021. FINDINGS: Trachea is deviated slightly to the right, as before. Left IJ power port tip is in the SVC. Thick-walled cavitation in the right upper lobe with an air-fluid level. The extent of soft tissue thickening inferiorly, in the right perihilar region, has increased from 01/05/2022. No airspace consolidation in the left lung. No pleural fluid. IMPRESSION: Thick-walled cavitation with an air-fluid level in the right upper lobe, as before. The extent of soft tissue thickening along the inferior margin, in the right perihilar region, has increased in the interval. This may be due to superimposed pneumonia or progression of disease. Electronically Signed   By: Lorin Picket M.D.   On: 03/10/2022 10:59    ASSESSMENT & PLAN:   Primary cancer of right upper lobe of lung ([place]) # UNRESECTABLE/LOCALLY ADVANCED- Right upper lobe lung mass -concerning for malignancy [biopsy inconclusive/atypical cells]-   carbo Abraxane with radiation [finished end of Oct 2022]. MARCH 2023-CT scan shows Necrotic mass of the right upper lobe; improvement of the pneumonic process. April 2023- PD-L1 positive [cirucologene].  # Proceed with [name] #3 ;Labs today reviewed;  acceptable for treatment today. Plan re-imaging after 3 cycles of treatment.  CT scan ordered.  #Right chest wall  pain-/pleuritic- ON  fenatny patch 25 mcg.  Oxycodone 5 mg every 6 hours as needed [3-4 a day]-STABLE.   # COPD/fatigue- [coughing/phlegm/ no fevers]- on albuterol/advair [smoking]- TSH- WNL;Continue  Xopenex/ipratropium nebs-; Mucinex- DM BID.  Awaiting re- evaluation with pulmonary.  Reviewed the chest x-ray-infectious versus progressive malignancy..  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 Bary Castilla. Hold surgery for now-especially context of his incurable lung malignancy.  Await CT scan.  # Poorly controlled diabetes-blood sugars 113- 280;  Continue glipizide; and  Metformin. Monitor for now.   # IV mediport: functioning/STABLE  *appt thru mychart    # DISPOSITION: # add TSH to labs today  # Keytruda today; ADD IVFs over 1 hour.   #  in 1 week- IVFs-1 lit /1 hourTuesdays/Thursday   # in 2 weeks-  IVFs-1 lit /1 hourTuesdays/Thursday   # follow up in 3 weeks-MD;- labs- cbc/cmp;25 OH vit D levels- Keytruda; IVFs over 1 hour; CT scan CAP-  Dr.B   All questions were answered. The patient knows to call the clinic with any problems, questions or concerns.    [name], MD 03/18/2022 9:50 AM

## 2022-03-18 NOTE — ED Triage Notes (Signed)
C/o increasing SOB x several days, with sudden onset episode tonight. Stage IV lung CA, currently receiving immunotherapy. Pt. States baseline 97% on RA at home, tonight sat went down to 84%. Pt. Denies SOB in triage, received 125mg  solumedrol IM, and albuterol via medic.

## 2022-03-18 NOTE — ED Provider Notes (Signed)
[facility] Provider Note    Event Date/Time   First MD Initiated Contact with Patient 03/18/22 2203     (approximate)   History   Shortness of Breath (C/o increasing SOB x several days, with sudden onset episode tonight. Stage IV lung CA, currently receiving immunotherapy. Pt. States baseline 97% on RA at home, tonight sat went down to 84%. Pt. Denies SOB in triage, received 125mg  solumedrol IM, and albuterol via medic.)   HPI  Rodney Vega is a 66 y.o. male with past medical history of lung cancer on Keytruda, COPD who presents with shortness of breath.  Patient is currently on chemotherapy for stage IV lung cancer is not resectable.  Has been feeling more short of breath over the last 2 days.  Has been on Levaquin for empiric treatment of pneumonia after having URI symptoms last week.  Finishes antibiotics several days ago.  Breathing worsened tonight and sats were in the high 80s so his wife called EMS.  Patient endorses pleuritic right-sided chest pain with coughing that is been going on since he had radiation this is not new denies lower extremity swelling fevers chills.  Does have chronic cough.  Feeling quite weak decreased appetite.     Past Medical History:  Diagnosis Date   Cancer ([place])    Degenerative arthritis of knee    Depression    Diabetes mellitus without complication ([place])    Diverticulitis    Glaucoma    since 2004   Hernia 1979   History of kidney stones    Hypertension    Personal history of tobacco use, presenting hazards to health    Pre-diabetes    Screening for obesity    Special screening for malignant neoplasms, colon    Wears dentures    full upper and lower    Patient Active Problem List   Diagnosis Date Noted   Chronic anemia 12/02/2021   Cecum mass 12/02/2021   Malnutrition of moderate degree 12/01/2021   Inadequate oral intake 12/01/2021   Severe sepsis ([place]) 11/30/2021   SOB (shortness of breath) 11/30/2021    Hypokalemia 11/30/2021   Acute respiratory failure with hypoxia ([place]) 11/30/2021   Depression    CAP (community acquired pneumonia) 11/29/2021   SCC of lung (small cell carcinoma), right ([place]) 06/08/2021   Diabetes mellitus without complication ([place]) 26/71/2458   Hypertension 06/04/2021   Primary cancer of right upper lobe of lung ([place]) 06/03/2021   Mass of upper lobe of right lung 05/20/2021   Status post total left knee replacement 01/06/2020   Total knee replacement status 11/16/2019   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/2018   Nephrolithiasis 05/08/2018   Status post total right knee replacement 05/08/2018   Obesity (BMI 30.0-34.9) 10/11/2017   Primary osteoarthritis of both knees 10/11/2017   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 --      Excl. in [place]? --     Most recent vital signs: Vitals:   03/18/22 2245 03/18/22 2330  BP: 129/67 126/68  Pulse: 82 83  Resp: 17 (!) 29  SpO2: 96% 91%     General: Awake, no distress.  CV:  Good peripheral perfusion.  Resp:  Normal effort.  No increased work of breathing, expiratory wheezing Abd:  No distention.  Neuro:             Awake, Alert, Oriented x 3  Other:     ED Results / Procedures / Treatments  Labs (all labs ordered are listed, but only abnormal results are displayed) Labs Reviewed  COMPREHENSIVE METABOLIC PANEL - Abnormal; Notable for the following components:      Result Value   Glucose, Bld 114 (*)    Calcium 8.3 (*)    Total Protein 6.3 (*)    Albumin 2.3 (*)    All other components  within normal limits  CBC WITH DIFFERENTIAL/PLATELET - Abnormal; Notable for the following components:   RBC 3.09 (*)    Hemoglobin 8.5 (*)    HCT 27.5 (*)    All other components within normal limits  BRAIN NATRIURETIC PEPTIDE - Abnormal; Notable for the following components:   B Natriuretic Peptide 193.2 (*)    All other components within normal limits  CULTURE, BLOOD (ROUTINE X 2)  CULTURE, BLOOD (ROUTINE X 2)  SARS CORONAVIRUS 2 BY RT PCR  PROCALCITONIN  TROPONIN I (HIGH SENSITIVITY)     EKG  EKG interpretation performed by myself: NSR, rightaxis, nml intervals, no acute ischemic changes    RADIOLOGY I reviewed and interpreted the CXR which does not show any acute cardiopulmonary process    PROCEDURES:  Critical Care performed: No  Procedures  The patient is on the cardiac monitor to evaluate for evidence of arrhythmia and/or significant heart rate changes.   MEDICATIONS ORDERED IN ED: Medications  cefTRIAXone (ROCEPHIN) 2 g in sodium chloride 0.9 % 100 mL IVPB (has no administration in time range)  azithromycin (ZITHROMAX) 500 mg in sodium chloride 0.9 % 250 mL IVPB (has no administration in time range)  ipratropium-albuterol (DUONEB) 0.5-2.5 (3) MG/3ML nebulizer solution 3 mL (has no administration in time range)  ipratropium-albuterol (DUONEB) 0.5-2.5 (3) MG/3ML nebulizer solution 3 mL (3 mLs Nebulization Given 03/18/22 2245)  iohexol (OMNIPAQUE) 350 MG/ML injection 75 mL (75 mLs Intravenous Contrast Given 03/18/22 2342)     IMPRESSION / MDM / ASSESSMENT AND PLAN / ED COURSE  I reviewed the triage vital signs and the nursing notes.                              Patient's presentation is most consistent with acute presentation with potential threat to life or bodily function.  Differential diagnosis includes, but is not limited to, pneumonia, pleural effusion, pulmonary embolus, heart failure  Patient is a 66 year old male with lung cancer recently treated  with course of Levaquin for presumed pneumonia who presents with dyspnea and hypoxia at home.  He is not on home oxygen.  Typically baseline O2 sat around 97% was in the 80s tonight.  Has been more dyspneic over the last 2 days.  Saw his oncology NP just yesterday.  He is getting Keytruda.  Has not had fevers as ongoing pleuritic  chest pain that he is had since having radiation and this is unchanged.  No lower extremity swelling.  He is satting around 90 to 91% on room air at rest.  Does have wheezing bilaterally with no increased work of breathing does not appear volume overloaded.  Will get chest x-ray labs.  He received Solu-Medrol with EMS we will give another DuoNeb here.  If chest x-ray not clearly diagnostic we will obtain a CTA to rule out pulmonary embolism.  Patient's chest x-ray does not have any obvious findings.  Hemoglobin 8.5 near baseline CMP reassuring.  Patient signed out to oncoming provider pending CTA.  Will require admission given his hypoxia.       FINAL CLINICAL IMPRESSION(S) / ED DIAGNOSES   Final diagnoses:  SOB (shortness of breath)  Hypoxia     Rx / DC Orders   ED Discharge Orders     None        Note:  This document was prepared using Dragon voice recognition software and may include unintentional dictation errors.   Rada Hay, MD 03/19/22 0030

## 2022-03-19 ENCOUNTER — Encounter: Payer: Self-pay | Admitting: Internal Medicine

## 2022-03-19 DIAGNOSIS — T380X5A Adverse effect of glucocorticoids and synthetic analogues, initial encounter: Secondary | ICD-10-CM | POA: Diagnosis not present

## 2022-03-19 DIAGNOSIS — I248 Other forms of acute ischemic heart disease: Secondary | ICD-10-CM | POA: Diagnosis present

## 2022-03-19 DIAGNOSIS — J441 Chronic obstructive pulmonary disease with (acute) exacerbation: Secondary | ICD-10-CM

## 2022-03-19 DIAGNOSIS — Z833 Family history of diabetes mellitus: Secondary | ICD-10-CM | POA: Diagnosis not present

## 2022-03-19 DIAGNOSIS — J189 Pneumonia, unspecified organism: Secondary | ICD-10-CM

## 2022-03-19 DIAGNOSIS — J9601 Acute respiratory failure with hypoxia: Secondary | ICD-10-CM

## 2022-03-19 DIAGNOSIS — J440 Chronic obstructive pulmonary disease with acute lower respiratory infection: Secondary | ICD-10-CM | POA: Diagnosis present

## 2022-03-19 DIAGNOSIS — D630 Anemia in neoplastic disease: Secondary | ICD-10-CM | POA: Diagnosis present

## 2022-03-19 DIAGNOSIS — R0602 Shortness of breath: Secondary | ICD-10-CM | POA: Diagnosis present

## 2022-03-19 DIAGNOSIS — F329 Major depressive disorder, single episode, unspecified: Secondary | ICD-10-CM | POA: Diagnosis present

## 2022-03-19 DIAGNOSIS — I10 Essential (primary) hypertension: Secondary | ICD-10-CM | POA: Diagnosis present

## 2022-03-19 DIAGNOSIS — Z8042 Family history of malignant neoplasm of prostate: Secondary | ICD-10-CM | POA: Diagnosis not present

## 2022-03-19 DIAGNOSIS — Z87442 Personal history of urinary calculi: Secondary | ICD-10-CM | POA: Diagnosis not present

## 2022-03-19 DIAGNOSIS — Z933 Colostomy status: Secondary | ICD-10-CM | POA: Diagnosis not present

## 2022-03-19 DIAGNOSIS — Z9842 Cataract extraction status, left eye: Secondary | ICD-10-CM | POA: Diagnosis not present

## 2022-03-19 DIAGNOSIS — F17210 Nicotine dependence, cigarettes, uncomplicated: Secondary | ICD-10-CM | POA: Diagnosis present

## 2022-03-19 DIAGNOSIS — E1165 Type 2 diabetes mellitus with hyperglycemia: Secondary | ICD-10-CM | POA: Diagnosis not present

## 2022-03-19 DIAGNOSIS — C3411 Malignant neoplasm of upper lobe, right bronchus or lung: Secondary | ICD-10-CM | POA: Diagnosis present

## 2022-03-19 DIAGNOSIS — Z20822 Contact with and (suspected) exposure to covid-19: Secondary | ICD-10-CM | POA: Diagnosis present

## 2022-03-19 DIAGNOSIS — Z96653 Presence of artificial knee joint, bilateral: Secondary | ICD-10-CM | POA: Diagnosis present

## 2022-03-19 DIAGNOSIS — Z79899 Other long term (current) drug therapy: Secondary | ICD-10-CM | POA: Diagnosis not present

## 2022-03-19 DIAGNOSIS — Z85038 Personal history of other malignant neoplasm of large intestine: Secondary | ICD-10-CM | POA: Diagnosis not present

## 2022-03-19 DIAGNOSIS — Y92239 Unspecified place in hospital as the place of occurrence of the external cause: Secondary | ICD-10-CM | POA: Diagnosis not present

## 2022-03-19 DIAGNOSIS — J449 Chronic obstructive pulmonary disease, unspecified: Secondary | ICD-10-CM | POA: Diagnosis present

## 2022-03-19 DIAGNOSIS — H409 Unspecified glaucoma: Secondary | ICD-10-CM | POA: Diagnosis present

## 2022-03-19 DIAGNOSIS — J9621 Acute and chronic respiratory failure with hypoxia: Secondary | ICD-10-CM | POA: Diagnosis present

## 2022-03-19 DIAGNOSIS — Z923 Personal history of irradiation: Secondary | ICD-10-CM | POA: Diagnosis not present

## 2022-03-19 DIAGNOSIS — Z8249 Family history of ischemic heart disease and other diseases of the circulatory system: Secondary | ICD-10-CM | POA: Diagnosis not present

## 2022-03-19 LAB — BASIC METABOLIC PANEL
Anion gap: 9 (ref 5–15)
BUN: 11 mg/dL (ref 8–23)
CO2: 24 mmol/L (ref 22–32)
Calcium: 8.6 mg/dL — ABNORMAL LOW (ref 8.9–10.3)
Chloride: 107 mmol/L (ref 98–111)
Creatinine, Ser: 0.67 mg/dL (ref 0.61–1.24)
GFR, Estimated: >60 mL/min (ref >60)
Glucose, Bld: 209 mg/dL — ABNORMAL HIGH (ref 70–99)
Potassium: 4 mmol/L (ref 3.5–5.1)
Sodium: 140 mmol/L (ref 135–145)

## 2022-03-19 LAB — CBC
HCT: 28.8 % — ABNORMAL LOW (ref 39.0–52.0)
Hemoglobin: 9 g/dL — ABNORMAL LOW (ref 13.0–17.0)
MCH: 27.3 pg (ref 26.0–34.0)
MCHC: 31.3 g/dL (ref 30.0–36.0)
MCV: 87.3 fL (ref 80.0–100.0)
Platelets: 244 10*3/uL (ref 150–400)
RBC: 3.3 MIL/uL — ABNORMAL LOW (ref 4.22–5.81)
RDW: 14.8 % (ref 11.5–15.5)
WBC: 6.8 10*3/uL (ref 4.0–10.5)
nRBC: 0 % (ref 0.0–0.2)

## 2022-03-19 LAB — SURGICAL PCR SCREEN
MRSA, PCR: NEGATIVE
Staphylococcus aureus: NEGATIVE

## 2022-03-19 LAB — EXPECTORATED SPUTUM ASSESSMENT W GRAM STAIN, RFLX TO RESP C

## 2022-03-19 LAB — URIC ACID: Uric Acid, Serum: 4.2 mg/dL (ref 3.7–8.6)

## 2022-03-19 LAB — GLUCOSE, CAPILLARY
Glucose-Capillary: 184 mg/dL — ABNORMAL HIGH (ref 70–99)
Glucose-Capillary: 177 mg/dL — ABNORMAL HIGH (ref 70–99)
Glucose-Capillary: 192 mg/dL — ABNORMAL HIGH (ref 70–99)
Glucose-Capillary: 171 mg/dL — ABNORMAL HIGH (ref 70–99)

## 2022-03-19 LAB — HEMOGLOBIN A1C
Hgb A1c MFr Bld: 5.9 % — ABNORMAL HIGH (ref 4.8–5.6)
Mean Plasma Glucose: 122.63 mg/dL

## 2022-03-19 LAB — PROCALCITONIN
Procalcitonin: <0.1 ng/mL
Procalcitonin: <0.1 ng/mL

## 2022-03-19 LAB — SARS CORONAVIRUS 2 BY RT PCR: SARS Coronavirus 2 by RT PCR: NEGATIVE

## 2022-03-19 LAB — HIV ANTIBODY (ROUTINE TESTING W REFLEX): HIV Screen 4th Generation wRfx: NONREACTIVE

## 2022-03-19 MED ORDER — IPRATROPIUM BROMIDE 0.02 % IN SOLN
0.5000 mg | RESPIRATORY_TRACT | Status: DC
  Administered 2022-03-19 (×3): 0.5 mg via RESPIRATORY_TRACT
  Filled 2022-03-19 (×3): qty 2.5

## 2022-03-19 MED ORDER — METHYLPREDNISOLONE SODIUM SUCC 40 MG IJ SOLR
40.0000 mg | Freq: Two times a day (BID) | INTRAMUSCULAR | Status: AC
  Administered 2022-03-19 – 2022-03-20 (×3): 40 mg via INTRAVENOUS
  Filled 2022-03-19 (×3): qty 1

## 2022-03-19 MED ORDER — ACETAMINOPHEN 650 MG RE SUPP: 650.0000 mg | Freq: Four times a day (QID) | RECTAL | Status: DC | PRN

## 2022-03-19 MED ORDER — VENLAFAXINE HCL ER 75 MG PO CP24
225.0000 mg | ORAL_CAPSULE | Freq: Every day | ORAL | Status: DC
  Administered 2022-03-19 – 2022-03-20 (×2): 225 mg via ORAL
  Filled 2022-03-19 (×2): qty 3

## 2022-03-19 MED ORDER — IPRATROPIUM-ALBUTEROL 0.5-2.5 (3) MG/3ML IN SOLN: 3.0000 mL | Freq: Two times a day (BID) | RESPIRATORY_TRACT | Status: DC

## 2022-03-19 MED ORDER — DORZOLAMIDE HCL-TIMOLOL MAL 2-0.5 % OP SOLN
1.0000 [drp] | Freq: Two times a day (BID) | OPHTHALMIC | Status: DC
  Administered 2022-03-19 – 2022-03-20 (×3): 1 [drp] via OPHTHALMIC
  Filled 2022-03-19 (×2): qty 10

## 2022-03-19 MED ORDER — CYANOCOBALAMIN 500 MCG PO TABS
500.0000 ug | ORAL_TABLET | Freq: Every day | ORAL | Status: DC
  Administered 2022-03-19 – 2022-03-20 (×2): 500 ug via ORAL
  Filled 2022-03-19 (×2): qty 1

## 2022-03-19 MED ORDER — OXYCODONE HCL 5 MG PO TABS: 5.0000 mg | ORAL_TABLET | Freq: Four times a day (QID) | ORAL | Status: DC | PRN

## 2022-03-19 MED ORDER — LATANOPROST 0.005 % OP SOLN
1.0000 [drp] | Freq: Every day | OPHTHALMIC | Status: DC
Start: 2022-03-19 — End: 2022-03-20
  Administered 2022-03-19: 1 [drp] via OPHTHALMIC
  Filled 2022-03-19 (×2): qty 2.5

## 2022-03-19 MED ORDER — ACETAMINOPHEN 325 MG PO TABS: 650.0000 mg | ORAL_TABLET | Freq: Four times a day (QID) | ORAL | Status: DC | PRN

## 2022-03-19 MED ORDER — INSULIN ASPART 100 UNIT/ML IJ SOLN
0.0000 [IU] | Freq: Three times a day (TID) | INTRAMUSCULAR | Status: DC
  Administered 2022-03-19 – 2022-03-20 (×4): 2 [IU] via SUBCUTANEOUS
  Administered 2022-03-20: 3 [IU] via SUBCUTANEOUS
  Administered 2022-03-20: 2 [IU] via SUBCUTANEOUS
  Filled 2022-03-19 (×6): qty 1

## 2022-03-19 MED ORDER — PREGABALIN 50 MG PO CAPS
50.0000 mg | ORAL_CAPSULE | Freq: Two times a day (BID) | ORAL | Status: DC
  Administered 2022-03-19 – 2022-03-20 (×3): 50 mg via ORAL
  Filled 2022-03-19 (×3): qty 1

## 2022-03-19 MED ORDER — SODIUM CHLORIDE 0.9 % IV SOLN
500.0000 mg | Freq: Once | INTRAVENOUS | Status: AC
  Administered 2022-03-19: 500 mg via INTRAVENOUS
  Filled 2022-03-19: qty 5

## 2022-03-19 MED ORDER — ADULT MULTIVITAMIN W/MINERALS CH
1.0000 | ORAL_TABLET | Freq: Every day | ORAL | Status: DC
  Administered 2022-03-19 – 2022-03-20 (×2): 1 via ORAL
  Filled 2022-03-19 (×2): qty 1

## 2022-03-19 MED ORDER — SODIUM CHLORIDE 0.9 % IV SOLN
2.0000 g | INTRAVENOUS | Status: DC
  Filled 2022-03-19: qty 20

## 2022-03-19 MED ORDER — ALBUTEROL SULFATE (2.5 MG/3ML) 0.083% IN NEBU
2.5000 mg | INHALATION_SOLUTION | RESPIRATORY_TRACT | Status: DC
  Administered 2022-03-19 (×3): 2.5 mg via RESPIRATORY_TRACT
  Filled 2022-03-19 (×3): qty 3

## 2022-03-19 MED ORDER — SODIUM CHLORIDE 0.9 % IV SOLN
2.0000 g | Freq: Once | INTRAVENOUS | Status: AC
  Administered 2022-03-19: 2 g via INTRAVENOUS
  Filled 2022-03-19: qty 20

## 2022-03-19 MED ORDER — DRONABINOL 2.5 MG PO CAPS
5.0000 mg | ORAL_CAPSULE | Freq: Two times a day (BID) | ORAL | Status: DC
  Administered 2022-03-20 (×2): 5 mg via ORAL
  Filled 2022-03-19 (×2): qty 2

## 2022-03-19 MED ORDER — PROPRANOLOL HCL ER 60 MG PO CP24
60.0000 mg | ORAL_CAPSULE | Freq: Every day | ORAL | Status: DC
  Administered 2022-03-19 – 2022-03-20 (×2): 60 mg via ORAL
  Filled 2022-03-19 (×2): qty 1

## 2022-03-19 MED ORDER — IPRATROPIUM-ALBUTEROL 0.5-2.5 (3) MG/3ML IN SOLN
3.0000 mL | RESPIRATORY_TRACT | Status: DC
Start: 2022-03-19 — End: 2022-03-19
  Administered 2022-03-19: 3 mL via RESPIRATORY_TRACT
  Filled 2022-03-19: qty 3

## 2022-03-19 MED ORDER — AMOXICILLIN-POT CLAVULANATE 875-125 MG PO TABS
1.0000 | ORAL_TABLET | Freq: Two times a day (BID) | ORAL | Status: DC
  Administered 2022-03-19 – 2022-03-20 (×2): 1 via ORAL
  Filled 2022-03-19 (×2): qty 1

## 2022-03-19 MED ORDER — BUPROPION HCL ER (XL) 150 MG PO TB24
150.0000 mg | ORAL_TABLET | Freq: Every day | ORAL | Status: DC
  Administered 2022-03-19: 150 mg via ORAL
  Filled 2022-03-19 (×2): qty 1

## 2022-03-19 MED ORDER — ALBUTEROL SULFATE (2.5 MG/3ML) 0.083% IN NEBU
2.5000 mg | INHALATION_SOLUTION | RESPIRATORY_TRACT | Status: DC | PRN
  Administered 2022-03-20: 2.5 mg via RESPIRATORY_TRACT
  Filled 2022-03-19: qty 3

## 2022-03-19 MED ORDER — FENTANYL 25 MCG/HR TD PT72: 1.0000 | MEDICATED_PATCH | TRANSDERMAL | Status: DC

## 2022-03-19 MED ORDER — BUDESONIDE 0.25 MG/2ML IN SUSP
0.2500 mg | Freq: Two times a day (BID) | RESPIRATORY_TRACT | Status: DC
  Administered 2022-03-19 – 2022-03-20 (×3): 0.25 mg via RESPIRATORY_TRACT
  Filled 2022-03-19 (×3): qty 2

## 2022-03-19 MED ORDER — SODIUM CHLORIDE 0.9 % IV SOLN
500.0000 mg | INTRAVENOUS | Status: DC
  Filled 2022-03-19: qty 5

## 2022-03-19 MED ORDER — ENSURE ENLIVE PO LIQD
237.0000 mL | Freq: Two times a day (BID) | ORAL | Status: DC
Start: 2022-03-19 — End: 2022-03-20
  Administered 2022-03-19 – 2022-03-20 (×3): 237 mL via ORAL

## 2022-03-19 MED ORDER — ENOXAPARIN SODIUM 40 MG/0.4ML IJ SOSY
40.0000 mg | PREFILLED_SYRINGE | INTRAMUSCULAR | Status: DC
  Administered 2022-03-19 – 2022-03-20 (×2): 40 mg via SUBCUTANEOUS
  Filled 2022-03-19 (×2): qty 0.4

## 2022-03-19 MED ORDER — PREDNISONE 50 MG PO TABS
50.0000 mg | ORAL_TABLET | Freq: Every day | ORAL | Status: DC
  Administered 2022-03-20: 50 mg via ORAL
  Filled 2022-03-19: qty 1

## 2022-03-19 MED ORDER — TAMSULOSIN HCL 0.4 MG PO CAPS
0.4000 mg | ORAL_CAPSULE | Freq: Every day | ORAL | Status: DC
  Administered 2022-03-19 – 2022-03-20 (×2): 0.4 mg via ORAL
  Filled 2022-03-19 (×2): qty 1

## 2022-03-19 MED ORDER — DIPHENHYDRAMINE HCL 25 MG PO CAPS
25.0000 mg | ORAL_CAPSULE | Freq: Every evening | ORAL | Status: DC | PRN
  Administered 2022-03-19: 50 mg via ORAL
  Filled 2022-03-19: qty 2

## 2022-03-19 MED ORDER — MONTELUKAST SODIUM 10 MG PO TABS
10.0000 mg | ORAL_TABLET | Freq: Every day | ORAL | Status: DC
  Administered 2022-03-19: 10 mg via ORAL
  Filled 2022-03-19: qty 1

## 2022-03-19 MED ORDER — IPRATROPIUM-ALBUTEROL 0.5-2.5 (3) MG/3ML IN SOLN
3.0000 mL | Freq: Once | RESPIRATORY_TRACT | Status: AC
  Administered 2022-03-19: 3 mL via RESPIRATORY_TRACT
  Filled 2022-03-19: qty 3

## 2022-03-19 NOTE — H&P (Signed)
History and Physical    Rodney Vega HYQ:[id number] DOB: 1956-04-19 DOA: 03/18/2022  PCP: [name], MD  Patient coming from: Home.  Chief Complaint: Shortness of breath.  HPI: Rodney Vega is a 66 y.o. male with history of lung cancer, COPD, diabetes mellitus, hypertension, cecal mass, colostomy presents to the ER because of shortness of breath.  Patient states that he has been short of breath over the last 3 days which acutely worsened after he got his Keytruda yesterday.  Has been having some productive cough.  Denies any chest pain fever chills.  Patient was on Levaquin for 7 days last week for possible pneumonia.  ED Course: In the ER patient had CT angiogram of the chest which shows dense consolidation concerning for pneumonia and patient also was wheezing.  Patient started on empiric antibiotics nebulizers and admitted for COPD and pneumonia.  BNP was 193 high sensitive troponin 15.  Review of Systems: As per HPI, rest all negative.   Past Medical History:  Diagnosis Date   Cancer ([place])    Degenerative arthritis of knee    Depression    Diabetes mellitus without complication ([place])    Diverticulitis    Glaucoma    since 2004   Hernia 1979   History of kidney stones    Hypertension    Personal history of tobacco use, presenting hazards to health    Pre-diabetes    Screening for obesity    Special screening for malignant neoplasms, colon    Wears dentures    full upper and lower    Past Surgical History:  Procedure Laterality Date   [place], 2012   lumbar bulging disc   CATARACT EXTRACTION W/PHACO Left 01/20/2021   Procedure: CATARACT EXTRACTION PHACO AND INTRAOCULAR LENS PLACEMENT ([place]) LEFT;  Surgeon: Birder Robson, MD;  Location: [facility];  Service: Ophthalmology;  Laterality: Left;  10.13 0:52.1   COLONOSCOPY  [id number]   [facility]/Dr. Buelah Manis sessile polyp,26mm, found in rectum,multiple diverticula were found in the sigmoid, in descending  and in transverse colon   COLONOSCOPY WITH PROPOFOL N/A 10/22/2021   Procedure: COLONOSCOPY WITH PROPOFOL;  Surgeon: Annamaria Helling, DO;  Location: [facility];  Service: Gastroenterology;  Laterality: N/A;  DM   COLOSTOMY  04-20-13   HERNIA REPAIR  1979   inguinal   IR IMAGING GUIDED PORT INSERTION  06/10/2021   JOINT REPLACEMENT Right    knee   KNEE ARTHROPLASTY Right 05/08/2018   Procedure: COMPUTER ASSISTED TOTAL KNEE ARTHROPLASTY;  Surgeon: Dereck Leep, MD;  Location: [facility];  Service: Orthopedics;  Laterality: Right;   KNEE ARTHROPLASTY Left 11/16/2019   Procedure: COMPUTER ASSISTED TOTAL KNEE ARTHROPLASTY;  Surgeon: Dereck Leep, MD;  Location: [facility];  Service: Orthopedics;  Laterality: Left;   LAPAROTOMY  04-20-13   colon resection with colosotmy   LITHOTRIPSY  2013   [place]     reports that he has been smoking cigarettes. He has a 30.00 pack-year smoking history. He has never used smokeless tobacco. He reports current alcohol use of about 2.0 standard drinks of alcohol per week. He reports current drug use. Drug: Marijuana.  Allergies  Allergen Reactions   Paclitaxel Shortness Of Breath   Ambien [Zolpidem]     Causes confusion    Nsaids     Abdominal pain   Aleve [Naproxen Sodium] Itching    Family History  Problem Relation Age of Onset   Diabetes Mother  Heart disease Mother    Heart attack Father    Prostate cancer Father     Prior to Admission medications   Medication Sig Start Date End Date Taking? Authorizing Provider  acetaminophen (TYLENOL) 500 MG tablet Take 2 tablets (1,000 mg total) by mouth daily as needed. Home med. 12/03/21  Yes [name], MD  albuterol (PROVENTIL) (2.5 MG/3ML) 0.083% nebulizer solution Take 2.5 mg by nebulization every 4 (four) hours as needed for wheezing or shortness of breath. 03/16/22  Yes [provider]  albuterol (VENTOLIN HFA) 108 (90 Base) MCG/ACT inhaler SMARTSIG:2  inhalation Via Inhaler Every 6 Hours PRN 03/05/22  Yes [provider]  baclofen (LIORESAL) 10 MG tablet Take 5 mg by mouth at bedtime as needed for muscle spasms.   Yes [provider]  buPROPion (WELLBUTRIN XL) 150 MG 24 hr tablet Take 1 tablet (150 mg total) by mouth daily. 03/10/22  Yes Borders, Kirt Boys, NP  cetirizine (ZYRTEC) 10 MG tablet Take 10 mg by mouth daily.   Yes [provider]  chlorpheniramine-HYDROcodone 10-8 MG/5ML Take 5 mLs by mouth at bedtime as needed for cough. 03/09/22  Yes Verlon Au, NP  dextromethorphan-guaiFENesin (MUCINEX DM) 30-600 MG 12hr tablet Take 1 tablet by mouth 2 (two) times daily as needed for cough. 12/03/21  Yes [name], MD  diphenhydramine-acetaminophen (TYLENOL PM) 25-500 MG TABS Take 1-2 tablets by mouth at bedtime as needed (sleep.).    Yes [provider]  dorzolamide-timolol (COSOPT) 22.3-6.8 MG/ML ophthalmic solution Place 1 drop into both eyes 2 (two) times daily.   Yes [provider]  dronabinol (MARINOL) 5 MG capsule Take 1 capsule (5 mg total) by mouth 2 (two) times daily before lunch and supper. 03/18/22  Yes [name], MD  eszopiclone (LUNESTA) 1 MG TABS tablet Take 1 tablet (1 mg total) by mouth at bedtime as needed for sleep. Take immediately before bedtime 03/02/22  Yes [name], MD  feeding supplement (ENSURE ENLIVE / ENSURE PLUS) LIQD Take 237 mLs by mouth 3 (three) times daily between meals. 12/03/21  Yes [name], MD  fentaNYL (DURAGESIC) 25 MCG/HR Place 1 patch onto the skin every 3 (three) days. 02/02/22  Yes [name], MD  glipiZIDE (GLUCOTROL) 5 MG tablet Take 1 tablet (5 mg total) by mouth 2 (two) times daily before a meal. 09/11/21  Yes Charlaine Dalton R, MD  glucose blood test strip Check your blood glucose levels twice a day; once in the morning before breakfast; and once in the evening after dinner. 06/29/21  Yes [name], MD   guaiFENesin-codeine 100-10 MG/5ML syrup Take 5 mLs by mouth daily as needed for cough. 11/02/21  Yes [name], MD  Lancets (FREESTYLE) lancets Check your blood glucose levels twice a day; once in the morning before breakfast; and once in the evening after dinner. 06/29/21  Yes [name], MD  latanoprost (XALATAN) 0.005 % ophthalmic solution Place 1 drop into both eyes at bedtime.   Yes [provider]  lidocaine-prilocaine (EMLA) cream Apply 1 application. topically as needed. 01/05/22  Yes Borders, Kirt Boys, NP  metFORMIN (GLUCOPHAGE) 500 MG tablet Take 1 tablet (500 mg total) by mouth 2 (two) times daily with a meal. 01/12/22  Yes Borders, Kirt Boys, NP  montelukast (SINGULAIR) 10 MG tablet TAKE 1 TABLET(10 MG) BY MOUTH AT BEDTIME 03/10/22  Yes Borders, Kirt Boys, NP  Multiple Vitamin (MULTIVITAMIN WITH MINERALS) TABS tablet Take 1 tablet  by mouth daily. 12/04/21  Yes [name], MD  oxyCODONE (OXY IR/ROXICODONE) 5 MG immediate release tablet Take 1 tablet (5 mg total) by mouth every 6 (six) hours as needed for severe pain. 03/18/22  Yes [name], MD  potassium chloride SA (KLOR-CON M) 20 MEQ tablet Take 1 tablet (20 mEq total) by mouth daily. 01/25/22  Yes [name], MD  pregabalin (LYRICA) 50 MG capsule TAKE 1 CAPSULE(50 MG) BY MOUTH TWICE DAILY 02/15/22  Yes [name], MD  propranolol ER (INDERAL LA) 60 MG 24 hr capsule Take 1 capsule (60 mg total) by mouth daily. 02/25/22  Yes [name], MD  Respiratory Therapy Supplies (NEBULIZER) DEVI Use as directed 11/02/21  Yes [name], MD  tamsulosin (FLOMAX) 0.4 MG CAPS capsule Take 0.4 mg by mouth daily.   Yes [provider]  venlafaxine XR (EFFEXOR-XR) 75 MG 24 hr capsule Take 225 mg by mouth daily. 09/24/19  Yes [provider]  vitamin B-12 (CYANOCOBALAMIN) 500 MCG tablet Take 1 tablet by mouth daily.   Yes [provider]  Grant Ruts INHUB 500-50  MCG/ACT AEPB INHALE 1 PUFF INTO THE LUNGS IN THE MORNING AND AT BEDTIME 02/10/22  Yes [name], MD  blood glucose meter kit and supplies KIT Check your blood glucose levels twice a day; once in the morning before breakfast; and once in the evening after dinner. 06/29/21   [name], MD  prochlorperazine (COMPAZINE) 10 MG tablet TAKE 1 TABLET(10 MG) BY MOUTH EVERY 6 HOURS AS NEEDED FOR NAUSEA OR VOMITING Patient not taking: Reported on 08/17/2021 07/09/21 09/11/21  [name], MD    Physical Exam: Constitutional: Moderately built and nourished. Vitals:   03/18/22 2330 03/19/22 0000 03/19/22 0030 03/19/22 0100  BP: 126/68 127/68  132/72  Pulse: 83 91 92 89  Resp: (!) 29 (!) 30 (!) 24 (!) 28  Temp:  98.3 F (36.8 C)    TempSrc:  Oral    SpO2: 91% 90% 95% 95%  Weight:      Height:       Eyes: Anicteric no pallor. ENMT: No discharge from the ears eyes nose and mouth. Neck: No mass felt.  No neck rigidity. Respiratory: Bilateral expiratory wheeze and no crepitations. Cardiovascular: S1-S2 heard. Abdomen: Soft nontender bowel sound present. Musculoskeletal: No edema. Skin: No rash. Neurologic: Alert awake oriented time place and person.  Moves all extremities. Psychiatric: Appears normal.  Normal affect.   Labs on Admission: I have personally reviewed following labs and imaging studies  CBC: Recent Labs  Lab 03/18/22 0845 03/18/22 2230  WBC 9.5 9.1  NEUTROABS 7.8* 7.2  HGB 9.0* 8.5*  HCT 28.8* 27.5*  MCV 89.4 89.0  PLT 227 086   Basic Metabolic Panel: Recent Labs  Lab 03/18/22 0845 03/18/22 2230  NA 136 139  K 3.4* 4.2  CL 101 106  CO2 25 25  GLUCOSE 214* 114*  BUN 9 8  CREATININE 0.88 0.69  CALCIUM 8.3* 8.3*   GFR: Estimated Creatinine Clearance: 99.7 mL/min (by C-G formula based on SCr of 0.69 mg/dL). Liver Function Tests: Recent Labs  Lab 03/18/22 0845 03/18/22 2230  AST 22 25  ALT 12 12  ALKPHOS 94 84  BILITOT 0.2* 0.8   PROT 6.5 6.3*  ALBUMIN 2.5* 2.3*   No results for input(s): "LIPASE", "AMYLASE" in the last 168 hours. No results for input(s): "AMMONIA" in the last 168 hours. Coagulation Profile: No results for input(s): "  INR", "PROTIME" in the last 168 hours. Cardiac Enzymes: No results for input(s): "CKTOTAL", "CKMB", "CKMBINDEX", "TROPONINI" in the last 168 hours. BNP (last 3 results) No results for input(s): "PROBNP" in the last 8760 hours. HbA1C: No results for input(s): "HGBA1C" in the last 72 hours. CBG: No results for input(s): "GLUCAP" in the last 168 hours. Lipid Profile: No results for input(s): "CHOL", "HDL", "LDLCALC", "TRIG", "CHOLHDL", "LDLDIRECT" in the last 72 hours. Thyroid Function Tests: Recent Labs    03/18/22 0845  TSH 2.814   Anemia Panel: No results for input(s): "VITAMINB12", "FOLATE", "FERRITIN", "TIBC", "IRON", "RETICCTPCT" in the last 72 hours. Urine analysis:    Component Value Date/Time   COLORURINE YELLOW (A) 11/30/2021 0511   APPEARANCEUR CLEAR (A) 11/30/2021 0511   APPEARANCEUR Cloudy 01/08/2012 1314   LABSPEC 1.004 (L) 11/30/2021 0511   LABSPEC 1.030 01/08/2012 1314   PHURINE 7.0 11/30/2021 0511   GLUCOSEU 50 (A) 11/30/2021 0511   GLUCOSEU 150 mg/dL 01/08/2012 1314   HGBUR SMALL (A) 11/30/2021 0511   BILIRUBINUR NEGATIVE 11/30/2021 0511   BILIRUBINUR Negative 01/08/2012 1314   KETONESUR NEGATIVE 11/30/2021 0511   PROTEINUR NEGATIVE 11/30/2021 0511   NITRITE NEGATIVE 11/30/2021 0511   LEUKOCYTESUR NEGATIVE 11/30/2021 0511   LEUKOCYTESUR Trace 01/08/2012 1314   Sepsis Labs: $[id number]$ (procalcitonin:4,lacticidven:4) ) Recent Results (from the past 240 hour(s))  SARS Coronavirus 2 by RT PCR (hospital order, performed in [place] hospital lab) *cepheid single result test* Anterior Nasal Swab     Status: None   Collection Time: 03/19/22 12:35 AM   Specimen: Anterior Nasal Swab  Result Value Ref Range Status   SARS Coronavirus 2 by RT PCR  NEGATIVE NEGATIVE Final    Comment: (NOTE) SARS-CoV-2 target nucleic acids are NOT DETECTED.  The SARS-CoV-2 RNA is generally detectable in upper and lower respiratory specimens during the acute phase of infection. The lowest concentration of SARS-CoV-2 viral copies this assay can detect is 250 copies / mL. A negative result does not preclude SARS-CoV-2 infection and should not be used as the sole basis for treatment or other patient management decisions.  A negative result may occur with improper specimen collection / handling, submission of specimen other than nasopharyngeal swab, presence of viral mutation(s) within the areas targeted by this assay, and inadequate number of viral copies (<250 copies / mL). A negative result must be combined with clinical observations, patient history, and epidemiological information.  Fact Sheet for Patients:   https://www.patel.info/  Fact Sheet for Healthcare Providers: https://hall.com/  This test is not yet approved or  cleared by the [place] FDA and has been authorized for detection and/or diagnosis of SARS-CoV-2 by FDA under an Emergency Use Authorization (EUA).  This EUA will remain in effect (meaning this test can be used) for the duration of the COVID-19 declaration under Section 564(b)(1) of the Act, 21 U.S.C. section 360bbb-3(b)(1), unless the authorization is terminated or revoked sooner.  Performed at [facility], [address] [place], [place] [address]      Radiological Exams on Admission: CT Angio Chest PE W and/or Wo Contrast  Result Date: 03/19/2022 CLINICAL DATA:  Pulmonary embolism (PE) suspected, high prob. Dyspnea. Lung cancer. EXAM: CT ANGIOGRAPHY CHEST WITH CONTRAST TECHNIQUE: Multidetector CT imaging of the chest was performed using the standard protocol during bolus administration of intravenous contrast. Multiplanar CT image reconstructions and MIPs were  obtained to evaluate the vascular anatomy. RADIATION DOSE REDUCTION: This exam was performed according to the departmental dose-optimization program which includes  automated exposure control, adjustment of the mA and/or kV according to patient size and/or use of iterative reconstruction technique. CONTRAST:  4mL OMNIPAQUE IOHEXOL 350 MG/ML SOLN COMPARISON:  12/29/2021 FINDINGS: Cardiovascular: A tiny web has developed within the right upper lobe pulmonary artery most in keeping with the sequela of remote pulmonary embolus. There is no intraluminal filling defect identified, however, to suggest acute pulmonary embolism. The central pulmonary arteries are of normal caliber. Mild coronary artery calcification. Global cardiac size within normal limits. No pericardial effusion. Mild atherosclerotic calcification within the thoracic aorta. No aortic aneurysm. Left internal jugular chest port is in place with its tip at the superior cavoatrial junction. Mediastinum/Nodes: Visualized thyroid is unremarkable. Shotty right paratracheal adenopathy has minimally progressed in the interval since prior examination with the index lymph node measuring 9 mm at axial image # 28/4 (previously measuring 6 mm). Shotty adenopathy has also developed within the right hilar and subcarinal lymph node groups. This is nonspecific and may reflect either nodal metastatic disease or reactive adenopathy given the inflammatory process within the right lung. Lungs/Pleura: Extensive cavitation in volume loss within the right upper lobe is again identified in appears grossly stable a small amount of layering fluid within the major cavity. There has developed, however, dense consolidation within the right lower lobe and extensive diffuse peribronchial nodularity with developing areas of parenchymal consolidation within the left lower lobe. Together, the findings are suspicious for acute infection, less likely aspiration. The findings are not typical  of that associated with immunotherapy related pneumonitis. No pneumothorax or pleural effusion. Upper Abdomen: Right adrenal mass is unchanged measuring 22 mm x 27 mm. No acute abnormality. Musculoskeletal: No acute bone abnormality. Remote right fifth rib fracture and erosive changes within the right third and fourth ribs are again noted. No new lytic or blastic bone lesion. Review of the MIP images confirms the above findings. IMPRESSION: 1. No acute pulmonary embolism. Interval development of a tiny web within the right upper lobe pulmonary artery most in keeping with the sequela of remote pulmonary embolus. 2. Interval development of dense consolidation within the right lower lobe and extensive diffuse peribronchial nodularity with developing areas of parenchymal consolidation within the left lower lobe. Together, the findings are suspicious for acute infection, less likely aspiration. The findings are not typical of that associated with immunotherapy related pneumonitis. Developing mediastinal adenopathy may represent reactive adenopathy but is nonspecific. 3. Stable cavitation and volume loss within the right upper lobe. 4. Stable right adrenal mass. Electronically Signed   By: Fidela Salisbury M.D.   On: 03/19/2022 00:08   DG Chest Portable 1 View  Result Date: 03/18/2022 CLINICAL DATA:  Chest pain. EXAM: PORTABLE CHEST 1 VIEW COMPARISON:  Chest radiograph dated 03/09/2022. FINDINGS: Left-sided Port-A-Cath in similar position. No interval change in the cavitary right upper lobe opacity. Diffuse interstitial nodular densities similar or progressed since the prior radiograph. Stable cardiomediastinal silhouette. No acute osseous pathology. IMPRESSION: 1. No interval change in the cavitary right upper lobe opacity. 2. Diffuse interstitial nodular densities similar or progressed since the prior radiograph. Electronically Signed   By: Anner Crete M.D.   On: 03/18/2022 23:09       Assessment/Plan Principal Problem:   COPD exacerbation (HCC) Active Problems:   Primary cancer of right upper lobe of lung (HCC)   Diabetes mellitus without complication ([place])   Hypertension    COPD exacerbation with pneumonia on empiric antibiotics nebulizer Pulmicort and IV steroids.  Closely monitor respiratory status check sputum  cultures. Lung cancer being followed by oncologist last dose of Keytruda was yesterday. Diabetes mellitus type 2 hemoglobin A1c in September 2022 was 7.6.  We will keep patient on sliding scale coverage.  Note that patient on steroids follow CBGs closely. 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  Follow CBC. Hypertension on propranolol.  Continue home dose. History of cecal mass and history of colostomy. History of depression on Effexor.   DVT prophylaxis: Lovenox. Code Status: Full code. Family Communication: Family at the bedside. Disposition Plan: Home. Consults called: None. Admission status: Observation.   Rise Patience MD [facility] Pager [phone].  If 7PM-7AM, please contact night-coverage [email or web address] Password [facility]  03/19/2022, 2:37 AM

## 2022-03-19 NOTE — Hospital Course (Signed)
66 year old male past medical history of lung cancer, COPD, diabetes mellitus and hypertension who presented to the emergency room on 6/15 evening with shortness of breath x3 days which worsened after he received his Keytruda.  Patient found to be hypoxic and in COPD exacerbation.  He was started on antibiotics and steroids.  Pulmonary consulted.  Patient admitted in the early morning hours of 6/16.  In follow-up, he has had some mild improvement.  Feeling a little bit better although still tachypneic.  Seen by pulmonary.  We will plan to continue steroids and antibiotics and potential discharge on 6/17.

## 2022-03-19 NOTE — ED Provider Notes (Signed)
12:00 AM  Assumed care of patient.  Patient here with shortness of breath, productive cough.  Patient having a COPD exacerbation.  Has received breathing treatments with EMS and here in the ED.  Received IM Solu-Medrol with EMS.  CTA of the chest pending for further evaluation.  He will need admission given he has new onset hypoxia.  12:45 AM  Pt's CTA chest reviewed/interpreted by myself and radiologist.  He has no new pulmonary embolus.  He does have development of consolidation within the right lower lobe consistent with pneumonia.  He does report having a productive cough.  Wife reports he just finished oral antibiotics for pneumonia earlier this week.  We will treat for community-acquired pneumonia with ceftriaxone and azithromycin.  Will discuss with hospitalist for admission for CAP, COPD exacerbation with new oxygen requirement.   2:05 AM  Consulted and discussed patient's case with hospitalist, Dr. Hal Hope.  I have recommended admission and consulting physician agrees and will place admission orders.  Patient (and family if present) agree with this plan.   I reviewed all nursing notes, vitals, pertinent previous records.  All labs, EKGs, imaging ordered have been independently reviewed and interpreted by myself.    CRITICAL CARE Performed by: Pryor Curia   Total critical care time: 30 minutes  Critical care time was exclusive of separately billable procedures and treating other patients.  Critical care was necessary to treat or prevent imminent or life-threatening deterioration.  Critical care was time spent personally by me on the following activities: development of treatment plan with patient and/or surrogate as well as nursing, discussions with consultants, evaluation of patient's response to treatment, examination of patient, obtaining history from patient or surrogate, ordering and performing treatments and interventions, ordering and review of laboratory studies, ordering and  review of radiographic studies, pulse oximetry and re-evaluation of patient's condition.    Aracely Rickett, Delice Bison, DO 03/19/22 0206

## 2022-03-19 NOTE — Progress Notes (Signed)
   Follow Up Note  HPI: 66 year old male past medical history of lung cancer, COPD, diabetes mellitus and hypertension who presented to the emergency room on 6/15 evening with shortness of breath x3 days which worsened after he received his Keytruda.  Patient found to be hypoxic and in COPD exacerbation.  He was started on antibiotics and steroids.  Pulmonary consulted.  Patient admitted in the early morning hours of 6/16.  In follow-up, he has had some mild improvement.  Feeling a little bit better although still tachypneic.  Seen by pulmonary.  We will plan to continue steroids and antibiotics and potential discharge on 6/17.   Exam: CV: Regular rate and rhythm, S1-S2 Lungs: Decreased breath sounds throughout with end expiratory wheeze  Gevena Barre MD

## 2022-03-19 NOTE — Plan of Care (Signed)

## 2022-03-19 NOTE — Progress Notes (Signed)
Initial Nutrition Assessment  DOCUMENTATION CODES:   Not applicable  INTERVENTION:   -Liberalize diet to regular for widest variety of meal selections -MVI with minerals daily -Ensure Enlive po BID, each supplement provides 350 kcal and 20 grams of protein  NUTRITION DIAGNOSIS:   Increased nutrient needs related to chronic illness (lung cancer) as evidenced by estimated needs.  GOAL:   Patient will meet greater than or equal to 90% of their needs  MONITOR:   PO intake, Supplement acceptance  REASON FOR ASSESSMENT:   Malnutrition Screening Tool    ASSESSMENT:   Pt with history of lung cancer, COPD, diabetes mellitus, hypertension, cecal mass, colostomy presents due to of shortness of breath.  Pt admitted with COPD exacerbation.    Reviewed I/O's: -325 ml x 24 hours  UOP: 325 ml x 24 hours  Pt sleeping soundly at time of visit. He did not waken to voice or touch. No family present to provide additional history.   Pt currently on a heart healthy, carb modified diet. No meal completion data available to assess at this time.   Reviewed wt hx; pt has experimented a 1.4% wt loss over the past month, which is not significant for time frame.   Pt with increased nutritional needs secondary to cancer and cancer related treatment; he would benefit from addition of oral nutrition supplements.   Medications reviewed and include marinol, solu-medrol, and vitamin B-12.   Lab Results  Component Value Date   HGBA1C 6.1 (H) 04/26/2018   PTA DM medications are 5 mg glipizide BID.   Labs reviewed: CBGS: 184 (inpatient orders for glycemic control are 0-9 units insulin aspart TID with meals).    NUTRITION - FOCUSED PHYSICAL EXAM:  Flowsheet Row Most Recent Value  Orbital Region No depletion  Upper Arm Region No depletion  Thoracic and Lumbar Region No depletion  Buccal Region No depletion  Temple Region No depletion  Clavicle Bone Region No depletion  Clavicle and Acromion  Bone Region No depletion  Scapular Bone Region No depletion  Dorsal Hand No depletion  Patellar Region No depletion  Anterior Thigh Region No depletion  Posterior Calf Region No depletion  Edema (RD Assessment) None  Hair Reviewed  Eyes Reviewed  Mouth Reviewed  Skin Reviewed  Nails Reviewed       Diet Order:   Diet Order             Diet regular Room service appropriate? Yes; Fluid consistency: Thin  Diet effective now                   EDUCATION NEEDS:   No education needs have been identified at this time  Skin:  Skin Assessment: Reviewed RN Assessment  Last BM:  03/18/22  Height:   Ht Readings from Last 1 Encounters:  03/18/22 6' (1.829 m)    Weight:   Wt Readings from Last 1 Encounters:  03/18/22 80 kg    Ideal Body Weight:  80.9 kg  BMI:  Body mass index is 23.91 kg/m.  Estimated Nutritional Needs:   Kcal:  2200-2400  Protein:  120-135 grams  Fluid:  > 2 L    Loistine Chance, RD, LDN, [place] Registered Dietitian II Certified Diabetes Care and Education Specialist Please refer to [facility] for RD and/or RD on-call/weekend/after hours pager

## 2022-03-19 NOTE — Consult Note (Signed)
PULMONOLOGY         Date: 03/19/2022,   MRN# [id number] Rodney Vega Nov 20, 1955     AdmissionWeight: 80 kg                 CurrentWeight: 80 kg  Referring provider: Dr. 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   HISTORY OF PRESENT ILLNESS   This is a pleasant 66 year old male with a history of right upper lobe cancer as well as recent diagnosis of colon cancer, major depression, diabetes, diverticulitis, glaucoma, nephrolithiasis, essential hypertension, obesity, lifelong smoking with COPD and chronic hypoxemia came in due to shortness of breath and states over the past 3 days its been worse after receiving immunotherapy with Keytruda.  Dyspnea is associated with productive cough with expectoration of whitish phlegm status post 7 days of levofloxacin with mild improvement however progressive dyspnea with requirement for ER visit.  On admission had CT angio which I reviewed independently findings of worsening right upper lobe consolidation and infiltrate with cavitary lesion concerning for recurrence of previous cancer.  Had cardiac biomarkers with mild elevation troponin 15 BNP 193 likely due to demand ischemia.  Started on empiric antibiotics and nebulizers with subsequent mild improvement.   PAST MEDICAL HISTORY   Past Medical History:  Diagnosis Date   Cancer Rodney Vega)    Degenerative arthritis of knee    Depression    Diabetes mellitus without complication ([place])    Diverticulitis    Glaucoma    since 2004   Hernia 1979   History of kidney stones    Hypertension    Personal history of tobacco use, presenting hazards to health    Pre-diabetes    Screening for obesity    Special screening for malignant neoplasms, colon    Wears dentures    full upper and lower     SURGICAL HISTORY   Past Surgical History:  Procedure Laterality Date   [place], 2012    lumbar bulging disc   CATARACT EXTRACTION W/PHACO Left 01/20/2021   Procedure: CATARACT EXTRACTION PHACO AND INTRAOCULAR LENS PLACEMENT ([place]) LEFT;  Surgeon: Birder Robson, MD;  Location: [place];  Service: Ophthalmology;  Laterality: Left;  10.13 0:52.1   COLONOSCOPY  [id number]   [facility]/Dr. Buelah Manis sessile polyp,56m, found in rectum,multiple diverticula were found in the sigmoid, in descending and in transverse colon   COLONOSCOPY WITH PROPOFOL N/A 10/22/2021   Procedure: COLONOSCOPY WITH PROPOFOL;  Surgeon: RAnnamaria Helling DO;  [facility];  Service: Gastroenterology;  Laterality: N/A;  DM   COLOSTOMY  04-20-13   HERNIA REPAIR  1979   inguinal   IR IMAGING GUIDED PORT INSERTION  06/10/2021   JOINT REPLACEMENT Right    knee   KNEE ARTHROPLASTY Right 05/08/2018   Procedure: COMPUTER ASSISTED TOTAL KNEE ARTHROPLASTY;  Surgeon: HDereck Leep MD;  Location: [facility];  Service: Orthopedics;  Laterality: Right;   KNEE ARTHROPLASTY Left 11/16/2019   Procedure: COMPUTER ASSISTED TOTAL KNEE ARTHROPLASTY;  Surgeon: HDereck Leep MD;  Location: [facility];  Service: Orthopedics;  Laterality: Left;   LAPAROTOMY  04-20-13   colon resection with colosotmy   LITHOTRIPSY  2013   TONSILLECTOMY AND ADENOIDECTOMY  1962     FAMILY HISTORY   Family History  Problem Relation Age of Onset   Diabetes Mother    Heart disease Mother    Heart attack Father  Prostate cancer Father      SOCIAL HISTORY   Social History   Tobacco Use   Smoking status: Every Day    Packs/day: 1.00    Years: 30.00    Total pack years: 30.00    Types: Cigarettes   Smokeless tobacco: Never   Tobacco comments:    since age 75 or 37  Vaping Use   Vaping Use: Never used  Substance Use Topics   Alcohol use: Yes    Alcohol/week: 2.0 standard drinks of alcohol    Types: 2 Standard drinks or equivalent per week    Comment: 1-2 drinks per week   Drug use: Yes    Types: Marijuana      MEDICATIONS    Home Medication:    Current Medication:  Current Facility-Administered Medications:    acetaminophen (TYLENOL) tablet 650 mg, 650 mg, Oral, Q6H PRN **OR** acetaminophen (TYLENOL) suppository 650 mg, 650 mg, Rectal, Q6H PRN, Rise Patience, MD   albuterol (PROVENTIL) (2.5 MG/3ML) 0.083% nebulizer solution 2.5 mg, 2.5 mg, Nebulization, Q4H, Rise Patience, MD, 2.5 mg at 03/19/22 1447   albuterol (PROVENTIL) (2.5 MG/3ML) 0.083% nebulizer solution 2.5 mg, 2.5 mg, Nebulization, Q2H PRN, Rise Patience, MD   azithromycin (ZITHROMAX) 500 mg in sodium chloride 0.9 % 250 mL IVPB, 500 mg, Intravenous, Q24H, Kakrakandy, Arshad N, MD   budesonide (PULMICORT) nebulizer solution 0.25 mg, 0.25 mg, Nebulization, BID, Hal Hope, Arshad N, MD, 0.25 mg at 03/19/22 0957   buPROPion (WELLBUTRIN XL) 24 hr tablet 150 mg, 150 mg, Oral, Daily, Rise Patience, MD, 150 mg at 03/19/22 0948   cefTRIAXone (ROCEPHIN) 2 g in sodium chloride 0.9 % 100 mL IVPB, 2 g, Intravenous, Q24H, Rise Patience, MD   diphenhydrAMINE (BENADRYL) capsule 25-50 mg, 25-50 mg, Oral, QHS PRN, Rise Patience, MD   dorzolamide-timolol (COSOPT) 22.3-6.8 MG/ML ophthalmic solution 1 drop, 1 drop, Both Eyes, BID, Rise Patience, MD, 1 drop at 03/19/22 0950   dronabinol (MARINOL) capsule 5 mg, 5 mg, Oral, BID AC, Rise Patience, MD   enoxaparin (LOVENOX) injection 40 mg, 40 mg, Subcutaneous, Q24H, Rise Patience, MD, 40 mg at 03/19/22 2952   feeding supplement (ENSURE ENLIVE / ENSURE PLUS) liquid 237 mL, 237 mL, Oral, BID BM, Gevena Barre K, MD, 237 mL at 03/19/22 1328   [START ON 03/21/2022] fentaNYL (DURAGESIC) 25 MCG/HR 1 patch, 1 patch, Transdermal, Q72H, Rise Patience, MD   insulin aspart (novoLOG) injection 0-9 Units, 0-9 Units, Subcutaneous, TID WC, Rise Patience, MD, 2 Units at 03/19/22 1326   ipratropium (ATROVENT) nebulizer solution 0.5 mg, 0.5 mg,  Nebulization, Q4H, Gean Birchwood N, MD, 0.5 mg at 03/19/22 1447   latanoprost (XALATAN) 0.005 % ophthalmic solution 1 drop, 1 drop, Both Eyes, QHS, Kakrakandy, Doreatha Lew, MD   methylPREDNISolone sodium succinate (SOLU-MEDROL) 40 mg/mL injection 40 mg, 40 mg, Intravenous, Q12H, Rise Patience, MD, 40 mg at 03/19/22 8413   montelukast (SINGULAIR) tablet 10 mg, 10 mg, Oral, QHS, Rise Patience, MD   multivitamin with minerals tablet 1 tablet, 1 tablet, Oral, Daily, Annita Brod, MD, 1 tablet at 03/19/22 1326   oxyCODONE (Oxy IR/ROXICODONE) immediate release tablet 5 mg, 5 mg, Oral, Q6H PRN, Rise Patience, MD   pregabalin (LYRICA) capsule 50 mg, 50 mg, Oral, BID, Rise Patience, MD, 50 mg at 03/19/22 0831   propranolol ER (INDERAL LA) 24 hr capsule 60 mg, 60 mg, Oral, Daily, Kakrakandy, Arshad N,  MD, 60 mg at 03/19/22 0948   tamsulosin (FLOMAX) capsule 0.4 mg, 0.4 mg, Oral, Daily, Rise Patience, MD, 0.4 mg at 03/19/22 0948   venlafaxine XR (EFFEXOR-XR) 24 hr capsule 225 mg, 225 mg, Oral, Daily, Rise Patience, MD, 225 mg at 03/19/22 4010   vitamin B-12 (CYANOCOBALAMIN) tablet 500 mcg, 500 mcg, Oral, Daily, Rise Patience, MD, 500 mcg at 03/19/22 2725  Facility-Administered Medications Ordered in Other Encounters:    heparin lock flush 100 UNIT/ML injection, , , ,    heparin lock flush 100 UNIT/ML injection, , , ,    heparin lock flush 100 UNIT/ML injection, , , ,     ALLERGIES   Paclitaxel, Ambien [zolpidem], Nsaids, and Aleve [naproxen sodium]     REVIEW OF SYSTEMS    Review of Systems:  Gen:  Denies  fever, sweats, chills weigh loss  HEENT: Denies blurred vision, double vision, ear pain, eye pain, hearing loss, nose bleeds, sore throat Cardiac:  No dizziness, chest pain or heaviness, chest tightness,edema Resp:   reports dyspnea chronically  Gi: Denies swallowing difficulty, stomach pain, nausea or vomiting, diarrhea,  constipation, bowel incontinence Gu:  Denies bladder incontinence, burning urine Ext:   Denies Joint pain, stiffness or swelling Skin: Denies  skin rash, easy bruising or bleeding or hives Endoc:  Denies polyuria, polydipsia , polyphagia or weight change Psych:   Denies depression, insomnia or hallucinations   Other:  All other systems negative   VS: BP 137/81 (BP Location: Left Arm)   Pulse 91   Temp 98 F (36.7 C)   Resp 15   Ht 6' (1.829 m)   Wt 80 kg   SpO2 91%   BMI 23.91 kg/m      PHYSICAL EXAM    GENERAL:NAD, no fevers, chills, no weakness no fatigue HEAD: Normocephalic, atraumatic.  EYES: Pupils equal, round, reactive to light. Extraocular muscles intact. No scleral icterus.  MOUTH: Moist mucosal membrane. Dentition intact. No abscess noted.  EAR, NOSE, THROAT: Clear without exudates. No external lesions.  NECK: Supple. No thyromegaly. No nodules. No JVD.  PULMONARY: decreased breath sounds with mild rhonchi worse at bases bilaterally.  CARDIOVASCULAR: S1 and S2. Regular rate and rhythm. No murmurs, rubs, or gallops. No edema. Pedal pulses 2+ bilaterally.  GASTROINTESTINAL: Soft, nontender, nondistended. No masses. Positive bowel sounds. No hepatosplenomegaly.  MUSCULOSKELETAL: No swelling, clubbing, or edema. Range of motion full in all extremities.  NEUROLOGIC: Cranial nerves II through XII are intact. No gross focal neurological deficits. Sensation intact. Reflexes intact.  SKIN: No ulceration, lesions, rashes, or cyanosis. Skin warm and dry. Turgor intact.  PSYCHIATRIC: Mood, affect within normal limits. The patient is awake, alert and oriented x 3. Insight, judgment intact.       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 Jacinto Reap -Fungitell serology -Procalcitonin trending MRSA PCR   Advanced COPD -DuoNebs while awake every 6 hours -No need for Pulmicort at this time -Incentive spirometry with  flutter valve.       Thank you for allowing  me to participate in the care of this patient.   Patient/Family are satisfied with care plan and all questions have been answered.    Provider disclosure: Patient with at least one acute or chronic illness or injury that poses a threat to life or bodily function and is being managed actively during this encounter.  All of the below services have been performed independently by signing provider:  review of prior documentation from internal and or external health records.  Review of previous and current lab results.  Interview and comprehensive assessment during patient visit today. Review of current and previous chest radiographs/CT scans. Discussion of management and test interpretation with health care team and patient/family.   This document was prepared using Dragon voice recognition software and may include unintentional dictation errors.     Ottie Glazier, M.D.  Division of Pulmonary & Critical Care Medicine

## 2022-03-19 NOTE — Evaluation (Addendum)
Physical Therapy Evaluation Patient Details Name: Rodney Vega MRN: [id number] DOB: [birth year] Today's Date: 03/19/2022  History of Present Illness  Pt is a 66 yo M with history of lung cancer, COPD, diabetes mellitus, hypertension, cecal mass, colostomy presents to the ER because of shortness of breath. 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  Clinical Impression  Pt was pleasant and motivated to participate during the session and put forth good effort throughout. Pt was able to complete bed mobility w/ supervision. Pt able to complete sit to stand transfers w/ CGA and extra time and effort to complete as pt used primarily LE and min UE support to RW. Pt was able to ambulate in hallway 240 ft using RW w/ CGA but needed frequent standing breaks (initiated by PT, pt noted for increased WOB) to allow for SpO2 to recover and cuing to maintain RW close to BOS. SpO2 as low as 84 on room air but able to recover to low 90s </= 87min of rest, MD and RN notified. Pt will benefit from HHPT upon discharge to safely address deficits listed in patient problem list for decreased caregiver assistance and eventual return to PLOF.       Recommendations for follow up therapy are one component of a multi-disciplinary discharge planning process, led by the attending physician.  Recommendations may be updated based on patient status, additional functional criteria and insurance authorization.  Follow Up Recommendations Home health PT    Assistance Recommended at Discharge Intermittent Supervision/Assistance  Patient can return home with the following  A little help with walking and/or transfers;A little help with bathing/dressing/bathroom;Assistance with cooking/housework;Help with stairs or ramp for entrance    Equipment Recommendations None recommended by PT  Recommendations for Other Services       Functional Status  Assessment Patient has had a recent decline in their functional status and demonstrates the ability to make significant improvements in function in a reasonable and predictable amount of time.     Precautions / Restrictions Precautions Precautions: Fall Restrictions Weight Bearing Restrictions: No      Mobility  Bed Mobility Overal bed mobility: Needs Assistance Bed Mobility: Supine to Sit, Sit to Supine     Supine to sit: Supervision Sit to supine: Supervision        Transfers Overall transfer level: Needs assistance Equipment used: Rolling walker (2 wheels) Transfers: Sit to/from Stand Sit to Stand: Min guard                Ambulation/Gait Ambulation/Gait assistance: Min guard Gait Distance (Feet): 240 Feet Assistive device: Rolling walker (2 wheels) Gait Pattern/deviations: Step-through pattern, Decreased step length - right, Decreased step length - left Gait velocity: decreased     General Gait Details: verbal cuing to maintain RW close, stable and no LOB; standing rests needed for Spo2 recovery  Stairs            Wheelchair Mobility    Modified Rankin (Stroke Patients Only)       Balance Overall balance assessment: Needs assistance Sitting-balance support: Feet supported Sitting balance-Leahy Scale: Good     Standing balance support: Bilateral upper extremity supported, During functional activity Standing balance-Leahy Scale: Fair                               Pertinent Vitals/Pain Pain Assessment Pain Assessment: No/denies pain    Home Living Family/patient expects  to be discharged to:: Private residence Living Arrangements: Spouse/significant other (wife) Available Help at Discharge: Family;Available 24 hours/day Type of Home: House Home Access: Stairs to enter;Ramped entrance Entrance Stairs-Rails: Can reach both Entrance Stairs-Number of Steps: 3 w/ handrails and ramp entrance in back   Home Layout: One  level Home Equipment: Conservation officer, nature (2 wheels);Cane - single point;Grab bars - tub/shower;Shower seat      Prior Function Prior Level of Function : Independent/Modified Independent             Mobility Comments: independent Transport planner using no AD and 2 history to falls ADLs Comments: independent w/ ADLs     Hand Dominance        Extremity/Trunk Assessment   Upper Extremity Assessment Upper Extremity Assessment: Overall WFL for tasks assessed    Lower Extremity Assessment Lower Extremity Assessment: Generalized weakness       Communication   Communication: No difficulties  Cognition Arousal/Alertness: Awake/alert Behavior During Therapy: WFL for tasks assessed/performed Overall Cognitive Status: Within Functional Limits for tasks assessed                                          General Comments      Exercises     Assessment/Plan    PT Assessment Patient needs continued PT services  PT Problem List Decreased strength;Decreased mobility;Decreased activity tolerance;Decreased balance;Cardiopulmonary status limiting activity       PT Treatment Interventions DME instruction;Therapeutic exercise;Balance training;Gait training;Stair training;Neuromuscular re-education;Functional mobility training;Therapeutic activities;Patient/family education    PT Goals (Current goals can be found in the Care Plan section)  Acute Rehab PT Goals Patient Stated Goal: pt would like to get back to independent walking PT Goal Formulation: With patient Time For Goal Achievement: 04/01/22 Potential to Achieve Goals: Good    Frequency Min 2X/week     Co-evaluation               AM-PAC PT "6 Clicks" Mobility  Outcome Measure Help needed turning from your back to your side while in a flat bed without using bedrails?: A Little Help needed moving from lying on your back to sitting on the side of a flat bed without using bedrails?: A Little Help  needed moving to and from a bed to a chair (including a wheelchair)?: A Little Help needed standing up from a chair using your arms (e.g., wheelchair or bedside chair)?: A Little Help needed to walk in hospital room?: A Little Help needed climbing 3-5 steps with a railing? : A Little 6 Click Score: 18    End of Session Equipment Utilized During Treatment: Gait belt Activity Tolerance: Patient tolerated treatment well Patient left: in bed;with call bell/phone within reach;with bed alarm set;with family/visitor present (w/ MD) Nurse Communication: Mobility status PT Visit Diagnosis: History of falling (Z91.81);Difficulty in walking, not elsewhere classified (R26.2);Muscle weakness (generalized) (M62.81);Unsteadiness on feet (R26.81)    Time: 1323-1340 PT Time Calculation (min) (ACUTE ONLY): 17 min   Charges:   PT Evaluation $PT Eval Low Complexity: 1 Low PT Treatments $Therapeutic Activity: 8-22 mins        Turner Daniels, SPT  03/19/2022, 4:13 PM

## 2022-03-20 ENCOUNTER — Inpatient Hospital Stay
Admit: 2022-03-20 | Discharge: 2022-03-20 | Disposition: A | Discharge status: Home / Self Care | Payer: Medicare PPO | Attending: Internal Medicine | Admitting: Internal Medicine

## 2022-03-20 DIAGNOSIS — C3411 Malignant neoplasm of upper lobe, right bronchus or lung: Secondary | ICD-10-CM | POA: Diagnosis not present

## 2022-03-20 DIAGNOSIS — J9621 Acute and chronic respiratory failure with hypoxia: Secondary | ICD-10-CM | POA: Diagnosis not present

## 2022-03-20 DIAGNOSIS — J189 Pneumonia, unspecified organism: Secondary | ICD-10-CM | POA: Diagnosis not present

## 2022-03-20 DIAGNOSIS — J441 Chronic obstructive pulmonary disease with (acute) exacerbation: Secondary | ICD-10-CM | POA: Diagnosis not present

## 2022-03-20 LAB — ECHOCARDIOGRAM COMPLETE
AV Peak grad: 13.1 mmHg
Ao pk vel: 1.81 m/s
Area-P 1/2: 4.83 cm2
Height: 72 in
P 1/2 time: 546 msec
Weight: 2820.8 oz

## 2022-03-20 LAB — GLUCOSE, CAPILLARY
Glucose-Capillary: 156 mg/dL — ABNORMAL HIGH (ref 70–99)
Glucose-Capillary: 215 mg/dL — ABNORMAL HIGH (ref 70–99)
Glucose-Capillary: 181 mg/dL — ABNORMAL HIGH (ref 70–99)

## 2022-03-20 MED ORDER — FLUCONAZOLE 100 MG PO TABS
100.0000 mg | ORAL_TABLET | Freq: Every day | ORAL | Status: DC
  Administered 2022-03-20: 100 mg via ORAL
  Filled 2022-03-20: qty 1

## 2022-03-20 MED ORDER — AMOXICILLIN-POT CLAVULANATE 875-125 MG PO TABS: 1.0000 | ORAL_TABLET | Freq: Two times a day (BID) | ORAL | 0 refills | Status: DC

## 2022-03-20 MED ORDER — FLUCONAZOLE 100 MG PO TABS: 100.0000 mg | ORAL_TABLET | Freq: Every day | ORAL | 0 refills | Status: DC

## 2022-03-20 MED ORDER — IPRATROPIUM-ALBUTEROL 0.5-2.5 (3) MG/3ML IN SOLN
3.0000 mL | Freq: Four times a day (QID) | RESPIRATORY_TRACT | Status: DC
  Administered 2022-03-20 (×2): 3 mL via RESPIRATORY_TRACT
  Filled 2022-03-20 (×2): qty 3

## 2022-03-20 MED ORDER — PREDNISONE 10 MG PO TABS
ORAL_TABLET | ORAL | 0 refills | Status: AC
Start: 2022-03-20 — End: 2022-03-30

## 2022-03-20 NOTE — Assessment & Plan Note (Signed)
As above.  On nebulizers and oxygen and steroids.  Discharged on prednisone taper.

## 2022-03-20 NOTE — Progress Notes (Signed)
*  PRELIMINARY RESULTS* Echocardiogram 2D Echocardiogram has been performed.  Rodney Vega 03/20/2022, 2:07 PM

## 2022-03-20 NOTE — Plan of Care (Signed)

## 2022-03-20 NOTE — Assessment & Plan Note (Signed)
Status post radiation.  CT scan noted possible recurrence.  Pulmonary is following this.

## 2022-03-20 NOTE — Discharge Summary (Signed)
Physician Discharge Summary   Patient: Rodney Vega MRN: [id number] DOB: 16-Jul-1956  Admit date:     03/18/2022  Discharge date: 03/20/22  Discharge Physician: Annita Brod   PCP: [name], MD   Recommendations at discharge:   New medication: Diflucan 100 p.o. daily x9 days New medication: Augmentin 875 p.o. twice daily x5 days Patient will follow-up with pulmonary Home health PT and OT Patient will start oxygen 2 L nasal cannula continuous New medication: Prednisone taper  Discharge Diagnoses: Principal Problem:   Acute on chronic respiratory failure with hypoxia ([place]) Active Problems:   Primary cancer of right upper lobe of lung ([place])   Diabetes mellitus without complication ([place])   Hypertension   COPD exacerbation ([place])  Resolved Problems:   * No resolved hospital problems. [facility] Course: 66 year old male past medical history of lung cancer, COPD, diabetes mellitus and hypertension who presented to the emergency room on 6/15 evening with shortness of breath x3 days which worsened after he received his Keytruda.  Patient found to be hypoxic and in COPD exacerbation.  He was started on antibiotics and steroids.  Pulmonary consulted.  Patient admitted in the early morning hours of 6/16.  In follow-up, he has had some mild improvement.  Feeling a little bit better although still tachypneic.  Seen by pulmonary.    Assessment and Plan: * Acute on chronic respiratory failure with hypoxia (HCC) Multifactorial including right upper lobe lung carcinoma, lifelong smoker and CT concerning for recurrence of possible previous cancer as well as infection.  Placed on Rocephin and Zithromax and changed over to Augmentin which she tolerated.  Much improved.  Patient is normally not home on oxygen and oxygen saturations at 94% on room air.  However, with ambulation, oxygen saturations at 86%.  Set up with 2 L nasal cannula at home.  Discharged on 9 more days of Diflucan for total  of 10 days plus Augmentin and prednisone taper.  Follow-up outpatient with pulmonary.  COPD exacerbation ([place]) As above.  On nebulizers and oxygen and steroids.  Discharged on prednisone taper.  Hypertension Continued on home medications.  Diabetes mellitus without complication ([place]) Treated with sliding scale.  A1c at 5.9 and patient is prediabetic.  Hyperglycemia secondary to steroids.  Primary cancer of right upper lobe of lung ([place]) Status post radiation.  CT scan noted possible recurrence.  Pulmonary is following this.         Consultants: Pulmonary Procedures performed: None Disposition: Home with home health Diet recommendation:  Discharge Diet Orders (From admission, onward)     Start     Ordered   03/20/22 0000  Diet - low sodium heart healthy        03/20/22 1348           Cardiac diet DISCHARGE MEDICATION: Allergies as of 03/20/2022       Reactions   Paclitaxel Shortness Of Breath   Ambien [zolpidem]    Causes confusion    Nsaids    Abdominal pain   Aleve [naproxen Sodium] Itching        Medication List     TAKE these medications    acetaminophen 500 MG tablet Commonly known as: TYLENOL Take 2 tablets (1,000 mg total) by mouth daily as needed. Home med.   albuterol 108 (90 Base) MCG/ACT inhaler Commonly known as: VENTOLIN HFA SMARTSIG:2 inhalation Via Inhaler Every 6 Hours PRN   albuterol (2.5 MG/3ML) 0.083% nebulizer solution Commonly known as: PROVENTIL Take 2.5 mg by  nebulization every 4 (four) hours as needed for wheezing or shortness of breath.   amoxicillin-clavulanate 875-125 MG tablet Commonly known as: AUGMENTIN Take 1 tablet by mouth every 12 (twelve) hours.   baclofen 10 MG tablet Commonly known as: LIORESAL Take 5 mg by mouth at bedtime as needed for muscle spasms.   blood glucose meter kit and supplies Kit Check your blood glucose levels twice a day; once in the morning before breakfast; and once in the evening after  dinner.   buPROPion 150 MG 24 hr tablet Commonly known as: WELLBUTRIN XL Take 1 tablet (150 mg total) by mouth daily.   cetirizine 10 MG tablet Commonly known as: ZYRTEC Take 10 mg by mouth daily.   chlorpheniramine-HYDROcodone 10-8 MG/5ML Take 5 mLs by mouth at bedtime as needed for cough.   dextromethorphan-guaiFENesin 30-600 MG 12hr tablet Commonly known as: MUCINEX DM Take 1 tablet by mouth 2 (two) times daily as needed for cough.   diphenhydramine-acetaminophen 25-500 MG Tabs tablet Commonly known as: TYLENOL PM Take 1-2 tablets by mouth at bedtime as needed (sleep.).   dorzolamide-timolol 22.3-6.8 MG/ML ophthalmic solution Commonly known as: COSOPT Place 1 drop into both eyes 2 (two) times daily.   dronabinol 5 MG capsule Commonly known as: MARINOL Take 1 capsule (5 mg total) by mouth 2 (two) times daily before lunch and supper.   eszopiclone 1 MG Tabs tablet Commonly known as: Lunesta Take 1 tablet (1 mg total) by mouth at bedtime as needed for sleep. Take immediately before bedtime   feeding supplement Liqd Take 237 mLs by mouth 3 (three) times daily between meals.   fentaNYL 25 MCG/HR Commonly known as: [place] 1 patch onto the skin every 3 (three) days.   fluconazole 100 MG tablet Commonly known as: DIFLUCAN Take 1 tablet (100 mg total) by mouth daily. Start taking on: March 21, 2022   freestyle lancets Check your blood glucose levels twice a day; once in the morning before breakfast; and once in the evening after dinner.   glipiZIDE 5 MG tablet Commonly known as: GLUCOTROL Take 1 tablet (5 mg total) by mouth 2 (two) times daily before a meal.   glucose blood test strip Check your blood glucose levels twice a day; once in the morning before breakfast; and once in the evening after dinner.   guaiFENesin-codeine 100-10 MG/5ML syrup Take 5 mLs by mouth daily as needed for cough.   latanoprost 0.005 % ophthalmic solution Commonly known as:  XALATAN Place 1 drop into both eyes at bedtime.   lidocaine-prilocaine cream Commonly known as: EMLA Apply 1 application. topically as needed.   metFORMIN 500 MG tablet Commonly known as: GLUCOPHAGE Take 1 tablet (500 mg total) by mouth 2 (two) times daily with a meal.   montelukast 10 MG tablet Commonly known as: SINGULAIR TAKE 1 TABLET(10 MG) BY MOUTH AT BEDTIME   multivitamin with minerals Tabs tablet Take 1 tablet by mouth daily.   Nebulizer Devi Use as directed   oxyCODONE 5 MG immediate release tablet Commonly known as: Oxy IR/ROXICODONE Take 1 tablet (5 mg total) by mouth every 6 (six) hours as needed for severe pain.   potassium chloride SA 20 MEQ tablet Commonly known as: KLOR-CON M Take 1 tablet (20 mEq total) by mouth daily.   predniSONE 10 MG tablet Commonly known as: DELTASONE Take 5 tablets (50 mg total) by mouth daily with breakfast for 2 days, THEN 4 tablets (40 mg total) daily with breakfast for 2 days,  THEN 3 tablets (30 mg total) daily with breakfast for 2 days, THEN 2 tablets (20 mg total) daily with breakfast for 2 days, THEN 1 tablet (10 mg total) daily with breakfast for 2 days. Start taking on: March 20, 2022   pregabalin 50 MG capsule Commonly known as: LYRICA TAKE 1 CAPSULE(50 MG) BY MOUTH TWICE DAILY   propranolol ER 60 MG 24 hr capsule Commonly known as: Inderal LA Take 1 capsule (60 mg total) by mouth daily.   tamsulosin 0.4 MG Caps capsule Commonly known as: FLOMAX Take 0.4 mg by mouth daily.   venlafaxine XR 75 MG 24 hr capsule Commonly known as: EFFEXOR-XR Take 225 mg by mouth daily.   vitamin B-12 500 MCG tablet Commonly known as: CYANOCOBALAMIN Take 1 tablet by mouth daily.   Wixela Inhub 500-50 MCG/ACT Aepb Generic drug: fluticasone-salmeterol INHALE 1 PUFF INTO THE LUNGS IN THE MORNING AND AT BEDTIME               Durable Medical Equipment  (From admission, onward)           Start     Ordered   03/20/22 1444   For home use only DME oxygen  Once       Question Answer Comment  Length of Need Lifetime   Liters per Minute 2   Frequency Continuous (stationary and portable oxygen unit needed)   Oxygen delivery system Gas      03/20/22 1443            Discharge Exam: Filed Weights   03/18/22 2208  Weight: 80 kg   General: Alert and oriented x3 Cardiovascular: Regular rate and rhythm, S1-S2 Lungs: Decreased breath sounds throughout  Condition at discharge: fair  The results of significant diagnostics from this hospitalization (including imaging, microbiology, ancillary and laboratory) are listed below for reference.   Imaging Studies: ECHOCARDIOGRAM COMPLETE  Result Date: 03/20/2022    ECHOCARDIOGRAM REPORT   Patient Name:   Rodney Vega Date of Exam: 03/20/2022 Medical Rec #:  [id number]    Height:       72.0 in Accession #:    [id number]   Weight:       176.3 lb Date of Birth:  13-Mar-1956    BSA:          2.019 m Patient Age:    35 years     BP:           129/64 mmHg Patient Gender: M            HR:           83 bpm. Exam Location:  [facility] Procedure: 2D Echo Indications:     CHF Acute Diastolic V74.82  History:         Patient has no prior history of Echocardiogram examinations.  Sonographer:     Kathlen Brunswick RDCS Referring Phys:  [place] Diagnosing Phys: Yolonda Kida MD  Sonographer Comments: Technically challenging study due to limited acoustic windows, no apical window and suboptimal parasternal window. Image acquisition challenging due to patient body habitus. Technically difficult study due to limited acoustic windows, no apicals and suboptimal parasternal images. IMPRESSIONS  1. Left ventricular ejection fraction, by estimation, is 55 to 60%. The left ventricle has normal function. The left ventricle has no regional wall motion abnormalities. Left ventricular diastolic parameters are consistent with Grade I diastolic dysfunction (impaired relaxation).  2. Right  ventricular systolic function is normal. The right ventricular size  is normal.  3. The mitral valve is normal in structure. Mild mitral valve regurgitation.  4. The aortic valve is normal in structure. Aortic valve regurgitation is trivial. Conclusion(s)/Recommendation(s): Poor windows for evaluation of left ventricular function by transthoracic echocardiography. Would recommend an alternative means of evaluation. FINDINGS  Left Ventricle: Left ventricular ejection fraction, by estimation, is 55 to 60%. The left ventricle has normal function. The left ventricle has no regional wall motion abnormalities. The left ventricular internal cavity size was normal in size. There is  no left ventricular hypertrophy. Left ventricular diastolic parameters are consistent with Grade I diastolic dysfunction (impaired relaxation). Right Ventricle: The right ventricular size is normal. No increase in right ventricular wall thickness. Right ventricular systolic function is normal. Left Atrium: Left atrial size was normal in size. Right Atrium: Right atrial size was normal in size. Pericardium: There is no evidence of pericardial effusion. Mitral Valve: The mitral valve is normal in structure. Mild mitral valve regurgitation. Tricuspid Valve: The tricuspid valve is normal in structure. Tricuspid valve regurgitation is trivial. Aortic Valve: The aortic valve is normal in structure. Aortic valve regurgitation is trivial. Aortic regurgitation PHT measures 546 msec. Aortic valve peak gradient measures 13.1 mmHg. Pulmonic Valve: The pulmonic valve was normal in structure. Pulmonic valve regurgitation is not visualized. Aorta: The ascending aorta was not well visualized. IAS/Shunts: No atrial level shunt detected by color flow Doppler.  AORTIC VALVE AV Vmax:      181.00 cm/s AV Peak Grad: 13.1 mmHg LVOT Vmax:    101.00 cm/s LVOT Vmean:   67.200 cm/s LVOT VTI:     0.213 m AI PHT:       546 msec MITRAL VALVE MV Area (PHT): 4.83 cm    SHUNTS  MV Decel Time: 157 msec    Systemic VTI: 0.21 m MV E velocity: 66.40 cm/s MV A velocity: 74.10 cm/s MV E/A ratio:  0.90 Yolonda Kida MD Electronically signed by Yolonda Kida MD Signature Date/Time: 03/20/2022/4:03:54 PM    Final    CT Angio Chest PE W and/or Wo Contrast  Result Date: 03/19/2022 CLINICAL DATA:  Pulmonary embolism (PE) suspected, high prob. Dyspnea. Lung cancer. EXAM: CT ANGIOGRAPHY CHEST WITH CONTRAST TECHNIQUE: Multidetector CT imaging of the chest was performed using the standard protocol during bolus administration of intravenous contrast. Multiplanar CT image reconstructions and MIPs were obtained to evaluate the vascular anatomy. RADIATION DOSE REDUCTION: This exam was performed according to the departmental dose-optimization program which includes automated exposure control, adjustment of the mA and/or kV according to patient size and/or use of iterative reconstruction technique. CONTRAST:  47m OMNIPAQUE IOHEXOL 350 MG/ML SOLN COMPARISON:  12/29/2021 FINDINGS: Cardiovascular: A tiny web has developed within the right upper lobe pulmonary artery most in keeping with the sequela of remote pulmonary embolus. There is no intraluminal filling defect identified, however, to suggest acute pulmonary embolism. The central pulmonary arteries are of normal caliber. Mild coronary artery calcification. Global cardiac size within normal limits. No pericardial effusion. Mild atherosclerotic calcification within the thoracic aorta. No aortic aneurysm. Left internal jugular chest port is in place with its tip at the superior cavoatrial junction. Mediastinum/Nodes: Visualized thyroid is unremarkable. Shotty right paratracheal adenopathy has minimally progressed in the interval since prior examination with the index lymph node measuring 9 mm at axial image # 28/4 (previously measuring 6 mm). Shotty adenopathy has also developed within the right hilar and subcarinal lymph node groups. This is  nonspecific and may reflect either nodal metastatic  disease or reactive adenopathy given the inflammatory process within the right lung. Lungs/Pleura: Extensive cavitation in volume loss within the right upper lobe is again identified in appears grossly stable a small amount of layering fluid within the major cavity. There has developed, however, dense consolidation within the right lower lobe and extensive diffuse peribronchial nodularity with developing areas of parenchymal consolidation within the left lower lobe. Together, the findings are suspicious for acute infection, less likely aspiration. The findings are not typical of that associated with immunotherapy related pneumonitis. No pneumothorax or pleural effusion. Upper Abdomen: Right adrenal mass is unchanged measuring 22 mm x 27 mm. No acute abnormality. Musculoskeletal: No acute bone abnormality. Remote right fifth rib fracture and erosive changes within the right third and fourth ribs are again noted. No new lytic or blastic bone lesion. Review of the MIP images confirms the above findings. IMPRESSION: 1. No acute pulmonary embolism. Interval development of a tiny web within the right upper lobe pulmonary artery most in keeping with the sequela of remote pulmonary embolus. 2. Interval development of dense consolidation within the right lower lobe and extensive diffuse peribronchial nodularity with developing areas of parenchymal consolidation within the left lower lobe. Together, the findings are suspicious for acute infection, less likely aspiration. The findings are not typical of that associated with immunotherapy related pneumonitis. Developing mediastinal adenopathy may represent reactive adenopathy but is nonspecific. 3. Stable cavitation and volume loss within the right upper lobe. 4. Stable right adrenal mass. Electronically Signed   By: Fidela Salisbury M.D.   On: 03/19/2022 00:08   DG Chest Portable 1 View  Result Date: 03/18/2022 CLINICAL  DATA:  Chest pain. EXAM: PORTABLE CHEST 1 VIEW COMPARISON:  Chest radiograph dated 03/09/2022. FINDINGS: Left-sided Port-A-Cath in similar position. No interval change in the cavitary right upper lobe opacity. Diffuse interstitial nodular densities similar or progressed since the prior radiograph. Stable cardiomediastinal silhouette. No acute osseous pathology. IMPRESSION: 1. No interval change in the cavitary right upper lobe opacity. 2. Diffuse interstitial nodular densities similar or progressed since the prior radiograph. Electronically Signed   By: Anner Crete M.D.   On: 03/18/2022 23:09   DG Chest 2 View  Result Date: 03/10/2022 CLINICAL DATA:  Cough, lung cancer, on Keytruda. EXAM: CHEST - 2 VIEW COMPARISON:  01/05/2022 and CT chest 12/21/2021. FINDINGS: Trachea is deviated slightly to the right, as before. Left IJ power port tip is in the SVC. Thick-walled cavitation in the right upper lobe with an air-fluid level. The extent of soft tissue thickening inferiorly, in the right perihilar region, has increased from 01/05/2022. No airspace consolidation in the left lung. No pleural fluid. IMPRESSION: Thick-walled cavitation with an air-fluid level in the right upper lobe, as before. The extent of soft tissue thickening along the inferior margin, in the right perihilar region, has increased in the interval. This may be due to superimposed pneumonia or progression of disease. Electronically Signed   By: Lorin Picket M.D.   On: 03/10/2022 10:59    Microbiology: Results for orders placed or performed during the hospital encounter of 03/18/22  Blood culture (routine x 2)     Status: None (Preliminary result)   Collection Time: 03/19/22 12:35 AM   Specimen: BLOOD  Result Value Ref Range Status   Specimen Description BLOOD RIGHT FOREARM  Final   Special Requests   Final    BOTTLES DRAWN AEROBIC AND ANAEROBIC Blood Culture adequate volume   Culture   Final    NO GROWTH  1 DAY Performed at [facility], [address] [place], [place] [address]    Report Status PENDING  Incomplete  Blood culture (routine x 2)     Status: None (Preliminary result)   Collection Time: 03/19/22 12:35 AM   Specimen: BLOOD  Result Value Ref Range Status   Specimen Description BLOOD RIGHT ASSIST CONTROL  Final   Special Requests   Final    BOTTLES DRAWN AEROBIC AND ANAEROBIC Blood Culture adequate volume   Culture   Final    NO GROWTH 1 DAY Performed at [facility], [address], [place], [place] [address]    Report Status PENDING  Incomplete  SARS Coronavirus 2 by RT PCR (hospital order, performed in [place] hospital lab) *cepheid single result test* Anterior Nasal Swab     Status: None   Collection Time: 03/19/22 12:35 AM   Specimen: Anterior Nasal Swab  Result Value Ref Range Status   SARS Coronavirus 2 by RT PCR NEGATIVE NEGATIVE Final    Comment: (NOTE) SARS-CoV-2 target nucleic acids are NOT DETECTED.  The SARS-CoV-2 RNA is generally detectable in upper and lower respiratory specimens during the acute phase of infection. The lowest concentration of SARS-CoV-2 viral copies this assay can detect is 250 copies / mL. A negative result does not preclude SARS-CoV-2 infection and should not be used as the sole basis for treatment or other patient management decisions.  A negative result may occur with improper specimen collection / handling, submission of specimen other than nasopharyngeal swab, presence of viral mutation(s) within the areas targeted by this assay, and inadequate number of viral copies (<250 copies / mL). A negative result must be combined with clinical observations, patient history, and epidemiological information.  Fact Sheet for Patients:   https://www.patel.info/  Fact Sheet for Healthcare Providers: https://hall.com/  This test is not yet approved or  cleared by the [place] FDA and has been  authorized for detection and/or diagnosis of SARS-CoV-2 by FDA under an Emergency Use Authorization (EUA).  This EUA will remain in effect (meaning this test can be used) for the duration of the COVID-19 declaration under Section 564(b)(1) of the Act, 21 U.S.C. section 360bbb-3(b)(1), unless the authorization is terminated or revoked sooner.  Performed at [facility], [address] [place], [place] [address]   Expectorated Sputum Assessment w Gram Stain, Rflx to Resp Cult     Status: None   Collection Time: 03/19/22  4:47 PM   Specimen: Expectorated Sputum  Result Value Ref Range Status   Specimen Description EXPECTORATED SPUTUM  Final   Special Requests NONE  Final   Sputum evaluation   Final    THIS SPECIMEN IS ACCEPTABLE FOR SPUTUM CULTURE Performed at [facility], [address], [place], [place] [address]    Report Status 03/19/2022 FINAL  Final  Culture, Respiratory w Gram Stain     Status: None (Preliminary result)   Collection Time: 03/19/22  4:47 PM  Result Value Ref Range Status   Specimen Description   Final    EXPECTORATED SPUTUM Performed at [facility], [address] [place],  [address]    Special Requests   Final    NONE Reflexed from [id number] Performed at [facility], [place], [place] [address]    Gram Stain   Final    RARE WBC PRESENT,BOTH PMN AND MONONUCLEAR RARE GRAM POSITIVE RODS RARE BUDDING YEAST SEEN RARE GRAM POSITIVE COCCI IN CHAINS  Culture   Final    TOO YOUNG TO READ Performed at [facility], [place] [address], [place], [place] [address]    Report Status PENDING  Incomplete  Surgical pcr screen     Status: None   Collection Time: 03/19/22  5:55 PM   Specimen: Nasal Mucosa; Nasal Swab  Result Value Ref Range Status   MRSA, PCR NEGATIVE NEGATIVE Final   Staphylococcus aureus NEGATIVE NEGATIVE Final    Comment: (NOTE) The Xpert SA Assay (FDA approved for NASAL  specimens in patients 12 years of age and older), is one component of a comprehensive surveillance program. It is not intended to diagnose infection nor to guide or monitor treatment. Performed at [facility], [address] [place], [place] [address]     Labs: CBC: Recent Labs  Lab 03/18/22 0845 03/18/22 2230 03/19/22 0610  WBC 9.5 9.1 6.8  NEUTROABS 7.8* 7.2  --   HGB 9.0* 8.5* 9.0*  HCT 28.8* 27.5* 28.8*  MCV 89.4 89.0 87.3  PLT 227 223 307   Basic Metabolic Panel: Recent Labs  Lab 03/18/22 0845 03/18/22 2230 03/19/22 0610  NA 136 139 140  K 3.4* 4.2 4.0  CL 101 106 107  CO2 _0 GLUCOSE 214* 114* 209*  BUN _1 CREATININE 0.88 0.69 0.67  CALCIUM 8.3* 8.3* 8.6*   Liver Function Tests: Recent Labs  Lab 03/18/22 0845 03/18/22 2230  AST 22 25  ALT 12 12  ALKPHOS 94 84  BILITOT 0.2* 0.8  PROT 6.5 6.3*  ALBUMIN 2.5* 2.3*   CBG: Recent Labs  Lab 03/19/22 1203 03/19/22 1645 03/19/22 2130 03/20/22 0820 03/20/22 1323  GLUCAP 177* 192* 171* 156* 215*    Discharge time spent: less than 30 minutes.  Signed: Annita Brod, MD [facility] 03/20/2022

## 2022-03-20 NOTE — Assessment & Plan Note (Signed)
Continued on home medications 

## 2022-03-20 NOTE — Assessment & Plan Note (Signed)
Treated with sliding scale.  A1c at 5.9 and patient is prediabetic.  Hyperglycemia secondary to steroids.

## 2022-03-20 NOTE — Progress Notes (Signed)
Spoke with [employer] regarding pts concentrator, o2 key and nasal cannula, per [employer] they will call pt and deliver to his address

## 2022-03-20 NOTE — Progress Notes (Signed)
SATURATION QUALIFICATIONS: (This note is used to comply with regulatory documentation for home oxygen)  Patient Saturations on Room Air at Rest = 95%  Patient Saturations on Room Air while Ambulating = 86%  Patient Saturations on 2 Liters of oxygen while Ambulating = 94%  Please briefly explain why patient needs home oxygen:

## 2022-03-20 NOTE — Progress Notes (Signed)
PULMONOLOGY         Date: 03/20/2022,   MRN# [id number] Rodney Vega 26-Oct-1955     AdmissionWeight: 80 kg                 CurrentWeight: 80 kg  Referring provider: Dr. 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   HISTORY OF PRESENT ILLNESS   This is a pleasant 66 year old male with a history of right upper lobe cancer as well as recent diagnosis of colon cancer, major depression, diabetes, diverticulitis, glaucoma, nephrolithiasis, essential hypertension, obesity, lifelong smoking with COPD and chronic hypoxemia came in due to shortness of breath and states over the past 3 days its been worse after receiving immunotherapy with Keytruda.  Dyspnea is associated with productive cough with expectoration of whitish phlegm status post 7 days of levofloxacin with mild improvement however progressive dyspnea with requirement for ER visit.  On admission had CT angio which I reviewed independently findings of worsening right upper lobe consolidation and infiltrate with cavitary lesion concerning for recurrence of previous cancer.  Had cardiac biomarkers with mild elevation troponin 15 BNP 193 likely due to demand ischemia.  Started on empiric antibiotics and nebulizers with subsequent mild improvement.  03/20/22- patient is improved clinically, he is expectorating thick yellow mucus.  He has mixed species respiratory cultures +, I have added diflucan 100daily and this can be continued on outpatient for total of 10d. I agree with augmetin and prednisone taper. Irecommend tapering prednisone by 82m every day.     PAST MEDICAL HISTORY   Past Medical History:  Diagnosis Date   Cancer ([facility]    Degenerative arthritis of knee    Depression    Diabetes mellitus without complication ([place]    Diverticulitis    Glaucoma    since 2004   Hernia 1979   History of kidney stones    Hypertension     Personal history of tobacco use, presenting hazards to health    Pre-diabetes    Screening for obesity    Special screening for malignant neoplasms, colon    Wears dentures    full upper and lower     SURGICAL HISTORY   Past Surgical History:  [place]cedure Laterality Date   [place]   lumbar bulging disc   CATARACT EXTRACTION W/PHACO Left 01/20/2021   Procedure: CATARACT EXTRACTION PHACO AND INTRAOCULAR LENS PLACEMENT ([place];  Surgeon: PBirder Robson MD;  [place]  Service: Ophthalmology;  Laterality: Left;  10.13 0:52.1   COLONOSCOPY  24098,1191  [facility]/Dr. SBuelah Manissessile polyp,554m found in rectum,multiple diverticula were found in the sigmoid, in descending and in transverse colon   COLONOSCOPY WITH PROPOFOL N/A 10/22/2021   Procedure: COLONOSCOPY WITH PROPOFOL;  Surgeon: RuAnnamaria HellingDO;  [facility];  Service: Gastroenterology;  Laterality: N/A;  DM   COLOSTOMY  04-20-13   HERNIA REPAIR  1979   inguinal   IR IMAGING GUIDED PORT INSERTION  06/10/2021   JOINT REPLACEMENT Right    knee   KNEE ARTHROPLASTY Right 05/08/2018   Procedure: COMPUTER ASSISTED TOTAL KNEE ARTHROPLASTY;  Surgeon: HoDereck LeepMD;  Location: [facility];  Service: Orthopedics;  Laterality: Right;   KNEE ARTHROPLASTY Left 11/16/2019   Procedure: COMPUTER ASSISTED TOTAL KNEE ARTHROPLASTY;  Surgeon: HoDereck LeepMD;  Location: [facility];  Service: Orthopedics;  Laterality: Left;   LAPAROTOMY  04-20-13   colon resection with colosotmy   LITHOTRIPSY  2013   TONSILLECTOMY AND ADENOIDECTOMY  1962     FAMILY HISTORY   Family History  Problem Relation Age of Onset   Diabetes Mother    Heart disease Mother    Heart attack Father    Prostate cancer Father      SOCIAL HISTORY   Social History   Tobacco Use   Smoking status: Every Day    Packs/day: 1.00    Years: 30.00    Total pack years: 30.00    Types: Cigarettes   Smokeless tobacco:  Never   Tobacco comments:    since age 55 or 80  Vaping Use   Vaping Use: Never used  Substance Use Topics   Alcohol use: Yes    Alcohol/week: 2.0 standard drinks of alcohol    Types: 2 Standard drinks or equivalent per week    Comment: 1-2 drinks per week   Drug use: Yes    Types: Marijuana     MEDICATIONS    Home Medication:    Current Medication:  Current Facility-Administered Medications:    acetaminophen (TYLENOL) tablet 650 mg, 650 mg, Oral, Q6H PRN **OR** acetaminophen (TYLENOL) suppository 650 mg, 650 mg, Rectal, Q6H PRN, Rise Patience, MD   albuterol (PROVENTIL) (2.5 MG/3ML) 0.083% nebulizer solution 2.5 mg, 2.5 mg, Nebulization, Q2H PRN, Rise Patience, MD, 2.5 mg at 03/20/22 0509   amoxicillin-clavulanate (AUGMENTIN) 875-125 MG per tablet 1 tablet, 1 tablet, Oral, Q12H, Annita Brod, MD, 1 tablet at 03/20/22 1002   budesonide (PULMICORT) nebulizer solution 0.25 mg, 0.25 mg, Nebulization, BID, Rise Patience, MD, 0.25 mg at 03/20/22 0753   buPROPion (WELLBUTRIN XL) 24 hr tablet 150 mg, 150 mg, Oral, Daily, Rise Patience, MD, 150 mg at 03/19/22 0948   diphenhydrAMINE (BENADRYL) capsule 25-50 mg, 25-50 mg, Oral, QHS PRN, Rise Patience, MD, 50 mg at 03/19/22 2214   dorzolamide-timolol (COSOPT) 22.3-6.8 MG/ML ophthalmic solution 1 drop, 1 drop, Both Eyes, BID, Rise Patience, MD, 1 drop at 03/20/22 1003   dronabinol (MARINOL) capsule 5 mg, 5 mg, Oral, BID AC, Rise Patience, MD   enoxaparin (LOVENOX) injection 40 mg, 40 mg, Subcutaneous, Q24H, Rise Patience, MD, 40 mg at 03/20/22 1004   feeding supplement (ENSURE ENLIVE / ENSURE PLUS) liquid 237 mL, 237 mL, Oral, BID BM, Gevena Barre K, MD, 237 mL at 03/20/22 1005   [START ON 03/21/2022] fentaNYL (DURAGESIC) 25 MCG/HR 1 patch, 1 patch, Transdermal, Q72H, Rise Patience, MD   fluconazole (DIFLUCAN) tablet 100 mg, 100 mg, Oral, Daily, Kissie Ziolkowski, MD    insulin aspart (novoLOG) injection 0-9 Units, 0-9 Units, Subcutaneous, TID WC, Rise Patience, MD, 2 Units at 03/20/22 1005   ipratropium-albuterol (DUONEB) 0.5-2.5 (3) MG/3ML nebulizer solution 3 mL, 3 mL, Nebulization, QID, Gevena Barre K, MD, 3 mL at 03/20/22 0753   latanoprost (XALATAN) 0.005 % ophthalmic solution 1 drop, 1 drop, Both Eyes, QHS, Rise Patience, MD, 1 drop at 03/19/22 2243   montelukast (SINGULAIR) tablet 10 mg, 10 mg, Oral, QHS, Rise Patience, MD, 10 mg at 03/19/22 2214   multivitamin with minerals tablet 1 tablet, 1 tablet, Oral, Daily, Annita Brod, MD, 1 tablet at 03/20/22 1002   oxyCODONE (Oxy IR/ROXICODONE) immediate release tablet 5 mg, 5 mg, Oral, Q6H PRN, Rise Patience, MD   predniSONE (DELTASONE) tablet 50 mg, 50 mg, Oral, Q breakfast, Annita Brod, MD,  50 mg at 03/20/22 1001   pregabalin (LYRICA) capsule 50 mg, 50 mg, Oral, BID, Rise Patience, MD, 50 mg at 03/20/22 1002   propranolol ER (INDERAL LA) 24 hr capsule 60 mg, 60 mg, Oral, Daily, Rise Patience, MD, 60 mg at 03/20/22 1002   tamsulosin (FLOMAX) capsule 0.4 mg, 0.4 mg, Oral, Daily, Rise Patience, MD, 0.4 mg at 03/20/22 1001   venlafaxine XR (EFFEXOR-XR) 24 hr capsule 225 mg, 225 mg, Oral, Daily, Rise Patience, MD, 225 mg at 03/20/22 1001   vitamin B-12 (CYANOCOBALAMIN) tablet 500 mcg, 500 mcg, Oral, Daily, Rise Patience, MD, 500 mcg at 03/20/22 1001  Facility-Administered Medications Ordered in Other Encounters:    heparin lock flush 100 UNIT/ML injection, , , ,    heparin lock flush 100 UNIT/ML injection, , , ,    heparin lock flush 100 UNIT/ML injection, , , ,     ALLERGIES   Paclitaxel, Ambien [zolpidem], Nsaids, and Aleve [naproxen sodium]     REVIEW OF SYSTEMS    Review of Systems:  Gen:  Denies  fever, sweats, chills weigh loss  HEENT: Denies blurred vision, double vision, ear pain, eye pain, hearing loss, nose  bleeds, sore throat Cardiac:  No dizziness, chest pain or heaviness, chest tightness,edema Resp:   reports dyspnea chronically  Gi: Denies swallowing difficulty, stomach pain, nausea or vomiting, diarrhea, constipation, bowel incontinence Gu:  Denies bladder incontinence, burning urine Ext:   Denies Joint pain, stiffness or swelling Skin: Denies  skin rash, easy bruising or bleeding or hives Endoc:  Denies polyuria, polydipsia , polyphagia or weight change Psych:   Denies depression, insomnia or hallucinations   Other:  All other systems negative   VS: BP 129/64 (BP Location: Left Arm)   Pulse 97   Temp 97.6 F (36.4 C) (Oral)   Resp 16   Ht 6' (1.829 m)   Wt 80 kg   SpO2 94%   BMI 23.91 kg/m      PHYSICAL EXAM    GENERAL:NAD, no fevers, chills, no weakness no fatigue HEAD: Normocephalic, atraumatic.  EYES: Pupils equal, round, reactive to light. Extraocular muscles intact. No scleral icterus.  MOUTH: Moist mucosal membrane. Dentition intact. No abscess noted.  EAR, NOSE, THROAT: Clear without exudates. No external lesions.  NECK: Supple. No thyromegaly. No nodules. No JVD.  PULMONARY: decreased breath sounds with mild rhonchi worse at bases bilaterally.  CARDIOVASCULAR: S1 and S2. Regular rate and rhythm. No murmurs, rubs, or gallops. No edema. Pedal pulses 2+ bilaterally.  GASTROINTESTINAL: Soft, nontender, nondistended. No masses. Positive bowel sounds. No hepatosplenomegaly.  MUSCULOSKELETAL: No swelling, clubbing, or edema. Range of motion full in all extremities.  NEUROLOGIC: Cranial nerves II through XII are intact. No gross focal neurological deficits. Sensation intact. Reflexes intact.  SKIN: No ulceration, lesions, rashes, or cyanosis. Skin warm and dry. Turgor intact.  PSYCHIATRIC: Mood, affect within normal limits. The patient is awake, alert and oriented x 3. Insight, judgment intact.       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 Jacinto Reap -Fungitell serology -Procalcitonin trending MRSA PCR -diflucan added to regimen x 10d 141m, continue augmenting x 7d and pred 50 with taper by 542mdaily .   Advanced COPD -DuoNebs while awake every 6 hours -No need for Pulmicort at this time -Incentive spirometry with  flutter valve.       Thank you for allowing me to participate in the care of this patient.   Patient/Family are satisfied with care plan and all questions have been answered.    Provider disclosure: Patient with at least one acute or chronic illness or injury that poses a threat to life or bodily function and is being managed actively during this encounter.  All of the below services have been performed independently by signing provider:  review of prior documentation from internal and or external health records.  Review of previous and current lab results.  Interview and comprehensive assessment during patient visit today. Review of current and previous chest radiographs/CT scans. Discussion of management and test interpretation with health care team and patient/family.   This document was prepared using Dragon voice recognition software and may include unintentional dictation errors.     FuOttie GlazierM.D.  Division of Pulmonary & Critical Care Medicine

## 2022-03-20 NOTE — Progress Notes (Signed)
Patient has been discharged home. Discharge papers given and explained to patient. He verbalized understanding. Meds and f/u appointments reviewed with patient. Meds were sent electronically to the pharmacy. Patient made aware.

## 2022-03-20 NOTE — TOC Transition Note (Signed)
Transition of Care [facility]) - CM/SW Discharge Note   Patient Details  Name: Rodney Vega MRN: [id number] Date of Birth: [birth year]  [facility] of [facility]) CM/SW Contact:  Izola Price, RN Phone Number: 03/20/2022, 2:23 PM   Clinical Narrative:   6/17: Patient is discharging today with [facility] and DME oxygen pending orders. Patient had no preference for [facility] PT and [facility] accepted per [facility]. [facility] notified of DME oxygen order and pending orders being placed in chart will deliver to room. Simmie Davies RN CM   Orders placed and [facility] and [facility] accepted. Simmie Davies RN CM           Patient Goals and CMS Choice        Discharge Placement                       Discharge Plan and Services                                     Social Determinants of Health (SDOH) Interventions     Readmission Risk Interventions    12/03/2021   10:07 AM  Readmission Risk Prevention Plan  Transportation Screening Complete  PCP or Specialist Appt within 3-5 Days Complete  Social Work Consult for [place] Planning/Counseling Complete  Palliative Care Screening Not Applicable  Medication Review Press photographer) Complete

## 2022-03-20 NOTE — Assessment & Plan Note (Signed)
Multifactorial including right upper lobe lung carcinoma, lifelong smoker and CT concerning for recurrence of possible previous cancer as well as infection.  Placed on Rocephin and Zithromax and changed over to Augmentin which she tolerated.  Much improved.  Patient is normally not home on oxygen and oxygen saturations at 94% on room air.  However, with ambulation, oxygen saturations at 86%.  Set up with 2 L nasal cannula at home.  Discharged on 9 more days of Diflucan for total of 10 days plus Augmentin and prednisone taper.  Follow-up outpatient with pulmonary.

## 2022-03-21 NOTE — TOC Progression Note (Signed)
Transition of Care [facility]) - Progression Note    Patient Details  Name: Rodney Vega MRN: [id number] Date of Birth: [birth year]  Transition of Care [facility]) CM/SW Contact  Izola Price, RN Phone Number: 03/21/2022, 10:34 AM  Clinical Narrative:  6/18: Spouse called RN CM and said oxygen tank was delivered to room but without tubing, which driver was to return with but never did, per spouse. Spouse has to take patient to pick up prescriptions before pharmacy closed at 6 pm.  Patient has been without oxygen overnight but not in acute distress per spouse. Contacted [employer] on call person and will have equipment to patient within 2 hours. To call RN CM back if not arrived by 1 pm today and to call 911 if becomes distressed. Simmie Davies RN CM       Barriers to Discharge: Barriers Resolved  Expected Discharge Plan and Services           Expected Discharge Date: 03/20/22               DME Arranged: N/A, Oxygen DME Agency: [facility] Date DME Agency Contacted: 03/20/22 Time DME Agency Contacted: [id number] Representative spoke with at DME Agency: [place]: PT [place]: [place] Date [place]: 03/20/22 Time [place]: 1450 Representative spoke with at [place]: [place] ([place]) Interventions    Readmission Risk Interventions    12/03/2021   10:07 AM  Readmission Risk Prevention Plan  Transportation Screening Complete  PCP or Specialist Appt within 3-5 Days Complete  Social Work Consult for [place] Planning/Counseling [place] Not Applicable  Medication Review Press photographer) Complete

## 2022-03-22 ENCOUNTER — Other Ambulatory Visit: Payer: Self-pay | Admitting: Internal Medicine

## 2022-03-22 LAB — CULTURE, RESPIRATORY W GRAM STAIN: Culture: NORMAL

## 2022-03-22 MED FILL — Dexamethasone Sodium Phosphate Inj 100 MG/10ML: INTRAMUSCULAR | Qty: 1 | Status: AC

## 2022-03-23 ENCOUNTER — Other Ambulatory Visit: Payer: Self-pay | Admitting: Internal Medicine

## 2022-03-23 ENCOUNTER — Encounter: Payer: Self-pay | Admitting: Internal Medicine

## 2022-03-23 ENCOUNTER — Inpatient Hospital Stay: Payer: Medicare PPO

## 2022-03-23 VITALS — BP 135/66 | HR 84 | Temp 97.4°F | Resp 18 | Wt 170.1 lb

## 2022-03-23 DIAGNOSIS — C3411 Malignant neoplasm of upper lobe, right bronchus or lung: Secondary | ICD-10-CM

## 2022-03-23 DIAGNOSIS — Z5112 Encounter for antineoplastic immunotherapy: Secondary | ICD-10-CM | POA: Diagnosis not present

## 2022-03-23 LAB — FUNGITELL, SERUM: Fungitell Result: <31 pg/mL (ref <80)

## 2022-03-23 MED ORDER — SODIUM CHLORIDE 0.9 % IV SOLN: Freq: Once | INTRAVENOUS | Status: DC

## 2022-03-23 MED ORDER — SODIUM CHLORIDE 0.9% FLUSH
10.0000 mL | Freq: Once | INTRAVENOUS | Status: AC | PRN
  Administered 2022-03-23: 10 mL
  Filled 2022-03-23: qty 10

## 2022-03-23 MED ORDER — ONDANSETRON HCL 4 MG/2ML IJ SOLN: 8.0000 mg | Freq: Once | INTRAMUSCULAR | Status: DC

## 2022-03-23 MED ORDER — HEPARIN SOD (PORK) LOCK FLUSH 100 UNIT/ML IV SOLN
500.0000 [IU] | Freq: Once | INTRAVENOUS | Status: AC | PRN
  Administered 2022-03-23: 500 [IU]
  Filled 2022-03-23: qty 5

## 2022-03-23 MED ORDER — SODIUM CHLORIDE 0.9 % IV SOLN
Freq: Once | INTRAVENOUS | Status: AC
  Filled 2022-03-23: qty 250

## 2022-03-23 NOTE — Progress Notes (Signed)
Hospitalized during weekend with exacerbation of COPD. Initiated home oxygen. Taking antibiotics  steroids and diflucan until finished with bottles. Pt VSS today. No obvious dyspnea. O2 sat 98 % on RA. Pt is getting an appetite but food tastes so bad to him. Discharged to home when fluids completed.

## 2022-03-24 ENCOUNTER — Encounter: Payer: Self-pay | Admitting: Internal Medicine

## 2022-03-24 LAB — CULTURE, BLOOD (ROUTINE X 2)
Culture: NO GROWTH
Culture: NO GROWTH
Special Requests: ADEQUATE
Special Requests: ADEQUATE

## 2022-03-25 ENCOUNTER — Inpatient Hospital Stay: Payer: Medicare PPO

## 2022-03-25 ENCOUNTER — Other Ambulatory Visit: Payer: Self-pay | Admitting: *Deleted

## 2022-03-25 VITALS — BP 168/76 | HR 86 | Temp 97.4°F | Resp 18 | Wt 171.2 lb

## 2022-03-25 DIAGNOSIS — C3411 Malignant neoplasm of upper lobe, right bronchus or lung: Secondary | ICD-10-CM

## 2022-03-25 DIAGNOSIS — Z5112 Encounter for antineoplastic immunotherapy: Secondary | ICD-10-CM | POA: Diagnosis not present

## 2022-03-25 MED ORDER — SODIUM CHLORIDE 0.9 % IV SOLN
Freq: Once | INTRAVENOUS | Status: AC
  Filled 2022-03-25: qty 250

## 2022-03-25 MED ORDER — HEPARIN SOD (PORK) LOCK FLUSH 100 UNIT/ML IV SOLN
500.0000 [IU] | Freq: Once | INTRAVENOUS | Status: AC | PRN
  Administered 2022-03-25: 500 [IU]
  Filled 2022-03-25: qty 5

## 2022-03-25 MED ORDER — FENTANYL 25 MCG/HR TD PT72: 1.0000 | MEDICATED_PATCH | TRANSDERMAL | 0 refills | Status: DC

## 2022-03-25 MED ORDER — SODIUM CHLORIDE 0.9% FLUSH
10.0000 mL | Freq: Once | INTRAVENOUS | Status: AC | PRN
  Administered 2022-03-25: 10 mL
  Filled 2022-03-25: qty 10

## 2022-03-25 NOTE — Telephone Encounter (Signed)
Pt requesting refill of fentanyl patches 65mcg. Also states that pharmacy did not receive lunesta that was refilled yesterday and asked that lunesta be sent in again.

## 2022-03-30 ENCOUNTER — Inpatient Hospital Stay: Payer: Medicare PPO

## 2022-03-30 VITALS — BP 132/72 | HR 81 | Temp 98.4°F | Wt 165.4 lb

## 2022-03-30 DIAGNOSIS — C3411 Malignant neoplasm of upper lobe, right bronchus or lung: Secondary | ICD-10-CM

## 2022-03-30 DIAGNOSIS — Z5112 Encounter for antineoplastic immunotherapy: Secondary | ICD-10-CM | POA: Diagnosis not present

## 2022-03-30 MED ORDER — SODIUM CHLORIDE 0.9 % IV SOLN
Freq: Once | INTRAVENOUS | Status: AC
  Filled 2022-03-30: qty 250

## 2022-03-30 MED ORDER — SODIUM CHLORIDE 0.9% FLUSH
10.0000 mL | Freq: Once | INTRAVENOUS | Status: AC | PRN
  Administered 2022-03-30: 10 mL
  Filled 2022-03-30: qty 10

## 2022-03-30 MED ORDER — HEPARIN SOD (PORK) LOCK FLUSH 100 UNIT/ML IV SOLN
500.0000 [IU] | Freq: Once | INTRAVENOUS | Status: AC | PRN
  Administered 2022-03-30: 500 [IU]
  Filled 2022-03-30: qty 5

## 2022-04-01 ENCOUNTER — Inpatient Hospital Stay: Payer: Medicare PPO

## 2022-04-01 VITALS — BP 159/78 | HR 103 | Temp 98.8°F | Resp 18 | Wt 165.1 lb

## 2022-04-01 DIAGNOSIS — C3411 Malignant neoplasm of upper lobe, right bronchus or lung: Secondary | ICD-10-CM

## 2022-04-01 DIAGNOSIS — Z5112 Encounter for antineoplastic immunotherapy: Secondary | ICD-10-CM | POA: Diagnosis not present

## 2022-04-01 MED ORDER — SODIUM CHLORIDE 0.9% FLUSH
10.0000 mL | Freq: Once | INTRAVENOUS | Status: AC | PRN
  Administered 2022-04-01: 10 mL
  Filled 2022-04-01: qty 10

## 2022-04-01 MED ORDER — HEPARIN SOD (PORK) LOCK FLUSH 100 UNIT/ML IV SOLN
500.0000 [IU] | Freq: Once | INTRAVENOUS | Status: AC | PRN
  Administered 2022-04-01: 500 [IU]
  Filled 2022-04-01: qty 5

## 2022-04-01 MED ORDER — SODIUM CHLORIDE 0.9 % IV SOLN
Freq: Once | INTRAVENOUS | Status: AC
  Filled 2022-04-01: qty 250

## 2022-04-02 ENCOUNTER — Ambulatory Visit
Admission: RE | Admit: 2022-04-02 | Discharge: 2022-04-02 | Disposition: A | Discharge status: Home / Self Care | Payer: Medicare PPO | Source: Ambulatory Visit | Attending: Internal Medicine | Admitting: Internal Medicine

## 2022-04-02 DIAGNOSIS — C3411 Malignant neoplasm of upper lobe, right bronchus or lung: Secondary | ICD-10-CM | POA: Insufficient documentation

## 2022-04-02 DIAGNOSIS — C180 Malignant neoplasm of cecum: Secondary | ICD-10-CM | POA: Diagnosis present

## 2022-04-02 MED ORDER — IOHEXOL 300 MG/ML  SOLN
100.0000 mL | Freq: Once | INTRAMUSCULAR | Status: AC | PRN
  Administered 2022-04-02: 100 mL via INTRAVENOUS

## 2022-04-05 ENCOUNTER — Inpatient Hospital Stay: Payer: Medicare PPO | Attending: Internal Medicine

## 2022-04-05 VITALS — BP 138/65 | HR 107 | Temp 97.5°F | Resp 18 | Wt 158.3 lb

## 2022-04-05 DIAGNOSIS — I2510 Atherosclerotic heart disease of native coronary artery without angina pectoris: Secondary | ICD-10-CM | POA: Insufficient documentation

## 2022-04-05 DIAGNOSIS — Z79899 Other long term (current) drug therapy: Secondary | ICD-10-CM | POA: Diagnosis not present

## 2022-04-05 DIAGNOSIS — E876 Hypokalemia: Secondary | ICD-10-CM | POA: Insufficient documentation

## 2022-04-05 DIAGNOSIS — I110 Hypertensive heart disease with heart failure: Secondary | ICD-10-CM | POA: Insufficient documentation

## 2022-04-05 DIAGNOSIS — Z933 Colostomy status: Secondary | ICD-10-CM | POA: Insufficient documentation

## 2022-04-05 DIAGNOSIS — J189 Pneumonia, unspecified organism: Secondary | ICD-10-CM | POA: Insufficient documentation

## 2022-04-05 DIAGNOSIS — F17210 Nicotine dependence, cigarettes, uncomplicated: Secondary | ICD-10-CM | POA: Insufficient documentation

## 2022-04-05 DIAGNOSIS — Z87442 Personal history of urinary calculi: Secondary | ICD-10-CM | POA: Insufficient documentation

## 2022-04-05 DIAGNOSIS — I083 Combined rheumatic disorders of mitral, aortic and tricuspid valves: Secondary | ICD-10-CM | POA: Diagnosis not present

## 2022-04-05 DIAGNOSIS — Z7984 Long term (current) use of oral hypoglycemic drugs: Secondary | ICD-10-CM | POA: Diagnosis not present

## 2022-04-05 DIAGNOSIS — Z86711 Personal history of pulmonary embolism: Secondary | ICD-10-CM | POA: Diagnosis not present

## 2022-04-05 DIAGNOSIS — E11649 Type 2 diabetes mellitus with hypoglycemia without coma: Secondary | ICD-10-CM | POA: Diagnosis not present

## 2022-04-05 DIAGNOSIS — N200 Calculus of kidney: Secondary | ICD-10-CM | POA: Diagnosis not present

## 2022-04-05 DIAGNOSIS — E279 Disorder of adrenal gland, unspecified: Secondary | ICD-10-CM | POA: Diagnosis not present

## 2022-04-05 DIAGNOSIS — Z5112 Encounter for antineoplastic immunotherapy: Secondary | ICD-10-CM | POA: Insufficient documentation

## 2022-04-05 DIAGNOSIS — E1165 Type 2 diabetes mellitus with hyperglycemia: Secondary | ICD-10-CM | POA: Insufficient documentation

## 2022-04-05 DIAGNOSIS — E860 Dehydration: Secondary | ICD-10-CM | POA: Insufficient documentation

## 2022-04-05 DIAGNOSIS — Z8042 Family history of malignant neoplasm of prostate: Secondary | ICD-10-CM | POA: Diagnosis not present

## 2022-04-05 DIAGNOSIS — C3411 Malignant neoplasm of upper lobe, right bronchus or lung: Secondary | ICD-10-CM | POA: Insufficient documentation

## 2022-04-05 DIAGNOSIS — I700 Atherosclerosis of aorta: Secondary | ICD-10-CM | POA: Insufficient documentation

## 2022-04-05 MED ORDER — HEPARIN SOD (PORK) LOCK FLUSH 100 UNIT/ML IV SOLN
500.0000 [IU] | Freq: Once | INTRAVENOUS | Status: AC
  Administered 2022-04-05: 500 [IU] via INTRAVENOUS
  Filled 2022-04-05: qty 5

## 2022-04-05 MED ORDER — SODIUM CHLORIDE 0.9% FLUSH
10.0000 mL | Freq: Once | INTRAVENOUS | Status: AC
  Administered 2022-04-05: 10 mL via INTRAVENOUS
  Filled 2022-04-05: qty 10

## 2022-04-05 MED ORDER — SODIUM CHLORIDE 0.9 % IV SOLN
Freq: Once | INTRAVENOUS | Status: AC
  Filled 2022-04-05: qty 250

## 2022-04-05 NOTE — Patient Instructions (Signed)
[facility] [place]  Discharge Instructions: Thank you for choosing [facility] to provide your oncology and hematology care.  If you have a lab appointment with the Cancer Center, please go directly to the Cancer Center and check in at the registration area.  Wear comfortable clothing and clothing appropriate for easy access to any Portacath or PICC line.   We strive to give you quality time with your provider. You may need to reschedule your appointment if you arrive late (15 or more minutes).  Arriving late affects you and other patients whose appointments are after yours.  Also, if you miss three or more appointments without notifying the office, you may be dismissed from the clinic at the provider's discretion.      For prescription refill requests, have your pharmacy contact our office and allow 72 hours for refills to be completed.       To help prevent nausea and vomiting after your treatment, we encourage you to take your nausea medication as directed.  BELOW ARE SYMPTOMS THAT SHOULD BE REPORTED IMMEDIATELY: *FEVER GREATER THAN 100.4 F (38 C) OR HIGHER *CHILLS OR SWEATING *NAUSEA AND VOMITING THAT IS NOT CONTROLLED WITH YOUR NAUSEA MEDICATION *UNUSUAL SHORTNESS OF BREATH *UNUSUAL BRUISING OR BLEEDING *URINARY PROBLEMS (pain or burning when urinating, or frequent urination) *BOWEL PROBLEMS (unusual diarrhea, constipation, pain near the anus) TENDERNESS IN MOUTH AND THROAT WITH OR WITHOUT PRESENCE OF ULCERS (sore throat, sores in mouth, or a toothache) UNUSUAL RASH, SWELLING OR PAIN  UNUSUAL VAGINAL DISCHARGE OR ITCHING   Items with * indicate a potential emergency and should be followed up as soon as possible or go to the Emergency Department if any problems should occur.  Please show the CHEMOTHERAPY ALERT CARD or IMMUNOTHERAPY ALERT CARD at check-in to the Emergency Department and triage nurse.  Should you have questions after your  visit or need to cancel or reschedule your appointment, please contact [facility]  [phone] and follow the prompts.  Office hours are 8:00 a.m. to 4:30 p.m. Monday - Friday. Please note that voicemails left after 4:00 p.m. may not be returned until the following business day.  We are closed weekends and major holidays. You have access to a nurse at all times for urgent questions. Please call the main number to the clinic [phone] and follow the prompts.  For any non-urgent questions, you may also contact your provider using MyChart. We now offer e-Visits for anyone 18 and older to request care online for non-urgent symptoms. For details visit [email or web address].   Also download the MyChart app! Go to the app store, search "MyChart", open the app, select [facility], and log in with your MyChart username and password.  Masks are optional in the cancer centers. If you would like for your care team to wear a mask while they are taking care of you, please let them know. For doctor visits, patients may have with them one support person who is at least 67 years old. At this time, visitors are not allowed in the infusion area.   

## 2022-04-05 NOTE — Progress Notes (Signed)
Patient received 1 L IVF , no questions/concerns voiced. Patient stable at discharge. Refused AVS .

## 2022-04-08 ENCOUNTER — Encounter: Payer: Self-pay | Admitting: Internal Medicine

## 2022-04-08 ENCOUNTER — Inpatient Hospital Stay: Payer: Medicare PPO

## 2022-04-08 ENCOUNTER — Inpatient Hospital Stay (HOSPITAL_BASED_OUTPATIENT_CLINIC_OR_DEPARTMENT_OTHER): Payer: Medicare PPO | Admitting: Internal Medicine

## 2022-04-08 ENCOUNTER — Other Ambulatory Visit: Payer: Self-pay

## 2022-04-08 DIAGNOSIS — Z5112 Encounter for antineoplastic immunotherapy: Secondary | ICD-10-CM | POA: Diagnosis not present

## 2022-04-08 DIAGNOSIS — C3411 Malignant neoplasm of upper lobe, right bronchus or lung: Secondary | ICD-10-CM | POA: Diagnosis not present

## 2022-04-08 DIAGNOSIS — C180 Malignant neoplasm of cecum: Secondary | ICD-10-CM

## 2022-04-08 LAB — COMPREHENSIVE METABOLIC PANEL
ALT: 10 U/L (ref 0–44)
AST: 17 U/L (ref 15–41)
Albumin: 2.9 g/dL — ABNORMAL LOW (ref 3.5–5.0)
Alkaline Phosphatase: 90 U/L (ref 38–126)
Anion gap: 8 (ref 5–15)
BUN: 7 mg/dL — ABNORMAL LOW (ref 8–23)
CO2: 25 mmol/L (ref 22–32)
Calcium: 8.4 mg/dL — ABNORMAL LOW (ref 8.9–10.3)
Chloride: 100 mmol/L (ref 98–111)
Creatinine, Ser: 0.83 mg/dL (ref 0.61–1.24)
GFR, Estimated: >60 mL/min (ref >60)
Glucose, Bld: 240 mg/dL — ABNORMAL HIGH (ref 70–99)
Potassium: 3.4 mmol/L — ABNORMAL LOW (ref 3.5–5.1)
Sodium: 133 mmol/L — ABNORMAL LOW (ref 135–145)
Total Bilirubin: 0.3 mg/dL (ref 0.3–1.2)
Total Protein: 6.9 g/dL (ref 6.5–8.1)

## 2022-04-08 LAB — CBC WITH DIFFERENTIAL/PLATELET
Abs Immature Granulocytes: 0.04 10*3/uL (ref 0.00–0.07)
Basophils Absolute: 0.1 10*3/uL (ref 0.0–0.1)
Basophils Relative: 1 %
Eosinophils Absolute: 0.3 10*3/uL (ref 0.0–0.5)
Eosinophils Relative: 3 %
HCT: 34.7 % — ABNORMAL LOW (ref 39.0–52.0)
Hemoglobin: 11.2 g/dL — ABNORMAL LOW (ref 13.0–17.0)
Immature Granulocytes: 1 %
Lymphocytes Relative: 12 %
Lymphs Abs: 1 10*3/uL (ref 0.7–4.0)
MCH: 28.1 pg (ref 26.0–34.0)
MCHC: 32.3 g/dL (ref 30.0–36.0)
MCV: 87.2 fL (ref 80.0–100.0)
Monocytes Absolute: 0.7 10*3/uL (ref 0.1–1.0)
Monocytes Relative: 8 %
Neutro Abs: 6.8 10*3/uL (ref 1.7–7.7)
Neutrophils Relative %: 75 %
Platelets: 203 10*3/uL (ref 150–400)
RBC: 3.98 MIL/uL — ABNORMAL LOW (ref 4.22–5.81)
RDW: 14.7 % (ref 11.5–15.5)
WBC: 8.8 10*3/uL (ref 4.0–10.5)
nRBC: 0 % (ref 0.0–0.2)

## 2022-04-08 LAB — VITAMIN D 25 HYDROXY (VIT D DEFICIENCY, FRACTURES): Vit D, 25-Hydroxy: 22.83 ng/mL — ABNORMAL LOW (ref 30–100)

## 2022-04-08 MED ORDER — SODIUM CHLORIDE 0.9 % IV SOLN
Freq: Once | INTRAVENOUS | Status: AC
  Filled 2022-04-08: qty 250

## 2022-04-08 MED ORDER — HEPARIN SOD (PORK) LOCK FLUSH 100 UNIT/ML IV SOLN
500.0000 [IU] | Freq: Once | INTRAVENOUS | Status: AC
  Administered 2022-04-08: 500 [IU] via INTRAVENOUS
  Filled 2022-04-08: qty 5

## 2022-04-08 NOTE — Assessment & Plan Note (Addendum)
#  UNRESECTABLE/LOCALLY ADVANCED- Right upper lobe lung mass -concerning for malignancy [biopsy inconclusive/atypical cells]-  MARCH 2023-CT scan shows Necrotic mass of the right upper lobe; improvement of the pneumonic process. April 2023- PD-L1 positive [cirucologene].  Currently on single agent Keytruda. 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  No evidence of any distant metastatic disease.   # HOLD keytruda #4 today-given ongoing fatigue/overall stable imaging.Labs today reviewed;  acceptable for treatment today.  #Right chest wall pain-/pleuritic- ON  fenatny patch 25 mcg.  Oxycodone 5 mg every 6 hours as needed [3-4 a day]-STABLE.   # COPD/fatigue- [coughing/phlegm/ no fevers]- on albuterol/advair [smoking]- TSH- WNL;Continue  Xopenex/ipratropium nebs-; Mucinex- DM BID. 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 Bary Castilla.  Continue hold surgery given ongoing lung issues.  # Poorly controlled diabetes-blood sugars 113- 280;  Continue glipizide; and  Metformin. Monitor for now.   # IV mediport: functioning/STABLE  *appt thru mychart    # DISPOSITION:  # HOLD Keytruda today; today- IVFs over 1 hour.   # in 1 week- IVFs-1 lit /1 hourTuesdays/Thursday   # in 2 weeks-NP- Sarah-  IVFs-1 lit /1 hourTuesdays/Thursday   # in 3 weeks-  IVFs-1 lit /1 hourTuesdays/Thursday   # follow up in 4 weeks-MD;- labs- cbc/cmp;possible Keytruda; IVFs over 1 hour; Dr.B  # I reviewed the blood work- with the patient in detail; also reviewed the imaging independently [as summarized above]; and with the patient in detail.

## 2022-04-08 NOTE — Progress Notes (Signed)
Has been in the hospital since last visit.  Chest xray and CT done at [place].  Wants to discuss not doing [place] today.

## 2022-04-08 NOTE — Progress Notes (Signed)
[place] NOTE  Patient Care Team: [name], MD as PCP - General (Family Medicine) [name], RN as Oncology Nurse Navigator [name], MD as Consulting Physician (Urology) [name], MD as Consulting Physician (Oncology)  CHIEF COMPLAINTS/PURPOSE OF CONSULTATION: lung cancer    Oncology History Overview Note  AUG 2022- 8.2 x 5.9 cm spiculated mass noted in right upper lobe consistent with malignancy. It extends from the right hilum to the lateral chest wall and results in lytic destruction of the lateral portion of the right fourth rib.  Mediastinal lymph nodes are noted, including 12 mm right hilar lymph node, consistent with metastatic disease. 3 cm right adrenal mass is noted concerning for metastatic disease.  # AUG 2022- PET IMPRESSION: Peripherally hypermetabolic right upper lobe mass, likely due to central necrosis. Mass extends into the right hilum and right lateral chest wall involving the second through fourth right ribs.   Mildly enlarged and hypermetabolic right hilar lymph node.   No evidence of metastatic disease in the abdomen or pelvis.  # AUG, 2022-RIGHT CHEST WALL Bx-necrosis; suspicious for malignancy not definitive [discussed with Dr.Kraynie;MDT] LUNG MASS, RIGHT; BIOPSY:  - SUSPICIOUS FOR MALIGNANCY.  - SEE COMMENT.   Comment:  Approximately six tissue cores demonstrate inflamed fibrous capsule with  hemosiderin-laden macrophages, in a background of abundant necrosis.  One of the cores demonstrates a minute fragment of viable epithelial  cells.  These cells are positive for p40.  They are negative for TTF1  and CK7. The cells of interest disappear on deeper cut levels.  While  the findings are suspicious for squamous cell carcinoma, there is not  enough viable tumor for a definitive diagnosis.  Additional tissue  sampling may be helpful.   # SEP 2022-cycle #2 Taxol hypersensitive reaction.  Switched  over to #3 cycle- carbo-Abraxane starting 06/29/2021  # DEC 2022- s/p ID- Dr.ravishankar- ? Lung abscess-no antibiotics-  JAN 2023- CT scan incidental- cecal mass- colonoscopy[Dr.Russo; [facility]- high grade dysplasia]  S/p evaluation with Dr. [name] surgery for now]   # S/p evaluation with Dr. [name].   Primary cancer of right upper lobe of lung ([place])  06/03/2021 Initial Diagnosis   Primary cancer of right upper lobe of lung ([place])   06/03/2021 Cancer Staging   Staging form: Lung, AJCC 8th Edition - Clinical: Stage IIIB (cT3, cN2, cM0) - Signed by [name], MD on 06/03/2021   06/15/2021 - 08/03/2021 Chemotherapy   Patient is on Treatment Plan : LUNG Carboplatin / Paclitaxel + XRT q7d     10/02/2021 - 10/19/2021 Chemotherapy   Patient is on Treatment Plan : LUNG Durvalumab q14d     02/04/2022 -  Chemotherapy   Patient is on Treatment Plan : LUNG NSCLC Pembrolizumab (200) q21d        HISTORY OF PRESENTING ILLNESS: Patient is ambulating independently.  Accompanied by wife.  Fay Swider 66 y.o.  male history of smoking -"lung cancer" [Limited necrotic tissue]-locally advanced unresectable currently on single agent Keytruda/CT scan.  In the interim patient was admitted to hospital for pneumonia.  Status post antibiotics steroids in the hospital.  Discharged home.  His breathing is improved.  His cough is slightly improved. He is needing oxycodone 3-4 times daily.     Review of Systems  Constitutional:  Positive for malaise/fatigue and weight loss. Negative for chills, diaphoresis and fever.  HENT:  Negative for nosebleeds and sore throat.   Eyes:  Negative for double vision.  Respiratory:  Positive for cough, sputum production and shortness of breath. Negative for hemoptysis and wheezing.   Cardiovascular:  Positive for chest pain. Negative for palpitations, orthopnea and leg swelling.  Gastrointestinal:  Positive for nausea. Negative for abdominal pain, blood in  stool, constipation, diarrhea, heartburn, melena and vomiting.  Genitourinary:  Negative for dysuria, frequency and urgency.  Musculoskeletal:  Negative for back pain and joint pain.  Skin: Negative.  Negative for itching and rash.  Neurological:  Positive for tingling. Negative for dizziness, focal weakness, weakness and headaches.  Endo/Heme/Allergies:  Does not bruise/bleed easily.  Psychiatric/Behavioral:  Negative for depression. The patient is not nervous/anxious and does not have insomnia.      MEDICAL HISTORY:  Past Medical History:  Diagnosis Date   Cancer ([place])    Degenerative arthritis of knee    Depression    Diabetes mellitus without complication ([place])    Diverticulitis    Glaucoma    since 2004   Hernia 1979   History of kidney stones    Hypertension    Personal history of tobacco use, presenting hazards to health    Pre-diabetes    Screening for obesity    Special screening for malignant neoplasms, colon    Wears dentures    full upper and lower    SURGICAL HISTORY: Past Surgical History:  Procedure Laterality Date   [place], 2012   lumbar bulging disc   CATARACT EXTRACTION W/PHACO Left 01/20/2021   Procedure: CATARACT EXTRACTION PHACO AND INTRAOCULAR LENS PLACEMENT ([place]) LEFT;  Surgeon: Birder Robson, MD;  Location: [facility];  Service: Ophthalmology;  Laterality: Left;  10.13 0:52.1   COLONOSCOPY  [id number]   [facility]/Dr. Buelah Manis sessile polyp,9m, found in rectum,multiple diverticula were found in the sigmoid, in descending and in transverse colon   COLONOSCOPY WITH PROPOFOL N/A 10/22/2021   Procedure: COLONOSCOPY WITH PROPOFOL;  Surgeon: RAnnamaria Helling DO;  [facility];  Service: Gastroenterology;  Laterality: N/A;  DM   COLOSTOMY  04-20-13   HERNIA REPAIR  1979   inguinal   IR IMAGING GUIDED PORT INSERTION  06/10/2021   JOINT REPLACEMENT Right    knee   KNEE ARTHROPLASTY Right 05/08/2018   Procedure: COMPUTER  ASSISTED TOTAL KNEE ARTHROPLASTY;  Surgeon: HDereck Leep MD;  Location: [facility];  Service: Orthopedics;  Laterality: Right;   KNEE ARTHROPLASTY Left 11/16/2019   Procedure: COMPUTER ASSISTED TOTAL KNEE ARTHROPLASTY;  Surgeon: HDereck Leep MD;  Location: [facility];  Service: Orthopedics;  Laterality: Left;   LAPAROTOMY  04-20-13   colon resection with colosotmy   LITHOTRIPSY  2013   TONSILLECTOMY AND ADENOIDECTOMY  1962    SOCIAL HISTORY: Social History   Socioeconomic History   Marital status: Married    Spouse name: Not on file   Number of children: Not on file   Years of education: Not on file   Highest education level: Not on file  Occupational History   Not on file  Tobacco Use   Smoking status: Every Day    Packs/day: 1.00    Years: 30.00    Total pack years: 30.00    Types: Cigarettes   Smokeless tobacco: Never   Tobacco comments:    since age [age] Vaping Use   Vaping Use: Never used  Substance and Sexual Activity   Alcohol use: Yes    Alcohol/week: 2.0 standard drinks of alcohol    Types: 2 Standard drinks or equivalent per  week    Comment: 1-2 drinks per week   Drug use: Yes    Types: Marijuana   Sexual activity: Not on file  Other Topics Concern   Not on file  Social History Narrative   15 mins [place]; smoking; not much alcohol. 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  MEDICATIONS:  Current Outpatient  Medications  Medication Sig Dispense Refill   acetaminophen (TYLENOL) 500 MG tablet Take 2 tablets (1,000 mg total) by mouth daily as needed. Home med. 30 tablet 0   albuterol (PROVENTIL) (2.5 MG/3ML) 0.083% nebulizer solution Take 2.5 mg by nebulization every 4 (four) hours as needed for wheezing or shortness of breath.     albuterol (VENTOLIN HFA) 108 (90 Base) MCG/ACT inhaler SMARTSIG:2 inhalation Via Inhaler Every 6 Hours PRN     amoxicillin-clavulanate (AUGMENTIN) 875-125 MG tablet Take 1 tablet by mouth every 12 (twelve) hours. 10 tablet 0   baclofen (LIORESAL) 10 MG tablet Take 5 mg by mouth at bedtime as needed for muscle spasms.     blood glucose meter kit and supplies KIT Check your blood glucose levels twice a day; once in the morning before breakfast; and once in the evening after dinner. 1 each 0   BREZTRI AEROSPHERE 160-9-4.8 MCG/ACT AERO Inhale 2 puffs into the lungs 2 (two) times daily.     buPROPion (WELLBUTRIN XL) 150 MG 24 hr tablet Take 1 tablet (150 mg total) by mouth daily. 30 tablet 2   cetirizine (ZYRTEC) 10 MG tablet Take 10 mg by mouth daily.     chlorpheniramine-HYDROcodone 10-8 MG/5ML Take 5 mLs by mouth at bedtime as needed for cough. 140 mL 0   dextromethorphan-guaiFENesin (MUCINEX DM) 30-600 MG 12hr tablet Take 1 tablet by mouth 2 (two) times daily as needed for cough.     diphenhydramine-acetaminophen (TYLENOL PM) 25-500 MG TABS Take 1-2 tablets by mouth at bedtime as needed (sleep.).      dorzolamide-timolol (COSOPT) 22.3-6.8 MG/ML ophthalmic solution Place 1 drop into both eyes 2 (two) times daily.     dronabinol (MARINOL) 5 MG capsule Take 1 capsule (5 mg total) by mouth 2 (two) times daily before lunch and supper. 60 capsule 1   eszopiclone (LUNESTA) 1 MG TABS tablet TAKE 1 TABLET(1 MG) BY MOUTH AT BEDTIME AS NEEDED FOR SLEEP 30 tablet 1   feeding supplement (ENSURE ENLIVE / ENSURE PLUS) LIQD Take 237 mLs by mouth 3 (three) times daily between meals.     fentaNYL  (DURAGESIC) 25 MCG/HR Place 1 patch onto the skin every 3 (three) days. 10 patch 0   fluconazole (DIFLUCAN) 100 MG tablet Take 1 tablet (100 mg total) by mouth daily. 9 tablet 0   glipiZIDE (GLUCOTROL) 5 MG tablet TAKE 1 TABLET(5 MG) BY MOUTH DAILY BEFORE BREAKFAST 90 tablet 2   glucose blood test strip Check your blood glucose levels twice a day; once  in the morning before breakfast; and once in the evening after dinner. 100 each 12   guaiFENesin-codeine 100-10 MG/5ML syrup Take 5 mLs by mouth daily as needed for cough. 120 mL 0   Lancets (FREESTYLE) lancets Check your blood glucose levels twice a day; once in the morning before breakfast; and once in the evening after dinner. 100 each 12   latanoprost (XALATAN) 0.005 % ophthalmic solution Place 1 drop into both eyes at bedtime.     lidocaine-prilocaine (EMLA) cream Apply 1 application. topically as needed. 30 g 3   metFORMIN (GLUCOPHAGE) 500 MG tablet Take 1 tablet (500 mg total) by mouth 2 (two) times daily with a meal. 60 tablet 2   montelukast (SINGULAIR) 10 MG tablet TAKE 1 TABLET(10 MG) BY MOUTH AT BEDTIME 90 tablet 0   Multiple Vitamin (MULTIVITAMIN WITH MINERALS) TABS tablet Take 1 tablet by mouth daily.     oxyCODONE (OXY IR/ROXICODONE) 5 MG immediate release tablet Take 1 tablet (5 mg total) by mouth every 6 (six) hours as needed for severe pain. 90 tablet 0   potassium chloride SA (KLOR-CON M) 20 MEQ tablet Take 1 tablet (20 mEq total) by mouth daily. 15 tablet 0   pregabalin (LYRICA) 50 MG capsule TAKE 1 CAPSULE(50 MG) BY MOUTH TWICE DAILY 60 capsule 1   propranolol ER (INDERAL LA) 60 MG 24 hr capsule Take 1 capsule (60 mg total) by mouth daily. 30 capsule 3   Respiratory Therapy Supplies (NEBULIZER) DEVI Use as directed 1 each 0   tamsulosin (FLOMAX) 0.4 MG CAPS capsule Take 0.4 mg by mouth daily.     venlafaxine XR (EFFEXOR-XR) 75 MG 24 hr capsule Take 225 mg by mouth daily.     vitamin B-12 (CYANOCOBALAMIN) 500 MCG tablet Take 1  tablet by mouth daily.     WIXELA INHUB 500-50 MCG/ACT AEPB INHALE 1 PUFF INTO THE LUNGS IN THE MORNING AND AT BEDTIME 60 each 1   No current facility-administered medications for this visit.   Facility-Administered Medications Ordered in Other Visits  Medication Dose Route Frequency Provider Last Rate Last Admin   heparin lock flush 100 UNIT/ML injection            heparin lock flush 100 UNIT/ML injection            heparin lock flush 100 UNIT/ML injection               .  PHYSICAL EXAMINATION: ECOG PERFORMANCE STATUS: 1 - Symptomatic but completely ambulatory  Vitals:   04/08/22 0853  BP: 119/77  Pulse: (!) 108  Temp: 98.2 F (36.8 C)  SpO2: 96%     Filed Weights   04/08/22 0853  Weight: 164 lb 8 oz (74.6 kg)      Physical Exam Vitals and nursing note reviewed.  HENT:     Head: Normocephalic and atraumatic.     Mouth/Throat:     Pharynx: Oropharynx is clear.  Eyes:     Extraocular Movements: Extraocular movements intact.     Pupils: Pupils are equal, round, and reactive to light.  Cardiovascular:     Rate and Rhythm: Normal rate and regular rhythm.  Pulmonary:     Comments: Decreased breath sounds bilaterally.  Abdominal:     Palpations: Abdomen is soft.  Musculoskeletal:        General: Normal range of motion.     Cervical back: Normal range of motion.  Skin:    General: Skin is warm.  Neurological:  General: No focal deficit present.     Mental Status: He is alert and oriented to person, place, and time.  Psychiatric:        Behavior: Behavior normal.        Judgment: Judgment normal.      LABORATORY DATA:  I have reviewed the data as listed Lab Results  Component Value Date   WBC 8.8 04/08/2022   HGB 11.2 (L) 04/08/2022   HCT 34.7 (L) 04/08/2022   MCV 87.2 04/08/2022   PLT 203 04/08/2022   Recent Labs    03/18/22 0845 03/18/22 2230 03/19/22 0610 04/08/22 0904  NA 136 139 140 133*  K 3.4* 4.2 4.0 3.4*  CL 101 106 107 100  CO2  _0 GLUCOSE 214* 114* 209* 240*  BUN _1 7*  CREATININE 0.88 0.69 0.67 0.83  CALCIUM 8.3* 8.3* 8.6* 8.4*  GFRNONAA >60 >60 >60 >60  PROT 6.5 6.3*  --  6.9  ALBUMIN 2.5* 2.3*  --  2.9*  AST 22 25  --  17  ALT 12 12  --  10  ALKPHOS 94 84  --  90  BILITOT 0.2* 0.8  --  0.3    RADIOGRAPHIC STUDIES: I have personally reviewed the radiological images as listed and agreed with the findings in the report. CT CHEST ABDOMEN PELVIS W CONTRAST  Result Date: 04/02/2022 CLINICAL DATA:  Non-small-cell lung cancer. Evaluate treatment response. Right upper lobe primary. Cecal cancer. COPD. * Tracking Code: BO * EXAM: CT CHEST, ABDOMEN, AND PELVIS WITH CONTRAST TECHNIQUE: Multidetector CT imaging of the chest, abdomen and pelvis was performed following the standard protocol during bolus administration of intravenous contrast. RADIATION DOSE REDUCTION: This exam was performed according to the departmental dose-optimization program which includes automated exposure control, adjustment of the mA and/or kV according to patient size and/or use of iterative reconstruction technique. CONTRAST:  161m OMNIPAQUE IOHEXOL 300 MG/ML  SOLN COMPARISON:  CTA chest of 03/18/2022. Most recent staging exams of 12/21/2021. FINDINGS: CT CHEST FINDINGS Cardiovascular: Left Port-A-Cath tip terminates at superior caval/atrial junction. Aortic atherosclerosis. Tortuous thoracic aorta. Normal heart size, without pericardial effusion. Lad and right coronary artery calcification. No central pulmonary embolism, on this non-dedicated study. Mediastinum/Nodes: No supraclavicular adenopathy. Subcarinal node measures 9 mm on 27/2 versus 10 mm on the 03/18/2022 exam (when remeasured). The right paratracheal node detailed on the prior exam is decreased in size at 7 mm today versus 9 mm on the prior. No hilar adenopathy. Lungs/Pleura: Similar trace right pleural fluid. Volume loss in the right hemithorax with tracheal deviation right.  Again identified is a thick walled cavitary process within the right apex and upper chest, similar in configuration to on the recent CTA and comparison CT. Diffuse peribronchovascular nodularity and interstitial thickening are again identified. Improvement in right lower lobe aeration with residual areas of dependent consolidation compared to 03/18/2022. Progressive compared to 12/21/2021. Posterior left upper lobe nodular consolidation including on 36/4, slightly decreased compared to 03/18/2022. A pleural-based left upper lobe pulmonary nodule laterally measures 8 mm on 38/4 and is similar to on the prior. Left lower lobe 1.0 cm nodule on 132/4 is decreased from 1.5 cm on 03/18/2022 (when remeasured). Musculoskeletal: Irregularity and cortical erosion involving the third anterolateral right rib is similar. Partial destruction of the lateral right fourth rib is unchanged. Minimally displaced fracture of the posterolateral right fifth rib again identified, without significant healing. CT ABDOMEN PELVIS FINDINGS Hepatobiliary: Mildly irregular hepatic capsule with  caudate and lateral segment left liver lobe enlargement. Normal gallbladder, without biliary ductal dilatation. Pancreas: Normal, without mass or ductal dilatation. Spleen: Normal in size, without focal abnormality. Adrenals/Urinary Tract: Right greater than left adrenal enlargement including at maximally 2.1 cm on the right, similar to on the prior exam and per that report, similar back to 2013. Multiple left renal collecting system calculi, including layering stones within the left renal pelvis. Prominence of the left renal pelvis, without hydroureter or bladder abnormality. Normal right kidney. Stomach/Bowel: Proximal gastric underdistention. Hartmann's pouch. Descending colostomy. Mass centered at the cecum, about the appendiceal orifice, measures 3.3 x 2.7 cm on 96/2 and is similar to 3.5 x 3.1 cm on the prior. No bowel obstruction. The appendix is  similarly dilated at 11 mm without surrounding inflammation. Normal small bowel. Vascular/Lymphatic: Aortic atherosclerosis. No abdominopelvic adenopathy. Reproductive: Normal prostate. Other: No significant free fluid. No evidence of omental or peritoneal disease. Musculoskeletal:  Lumbosacral spondylosis. IMPRESSION: CT CHEST IMPRESSION 1. 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 2. Since the CTA of 03/18/2022, improved appearance of the remainder of the lungs, likely related to chronic atypical infectious process versus aspiration. 3. Mild decrease in thoracic nodal size, favoring a reactive etiology. 4. Similar third through fifth rib abnormalities which could be due to tumor invasion and/or radiation induced necrosis. 5. Aortic atherosclerosis (ICD10-I70.0), coronary artery atherosclerosis and emphysema (ICD10-J43.9). CT ABDOMEN AND PELVIS IMPRESSION 1. No evidence of metastatic disease within the abdomen or pelvis. 2. Similar mass at the base of the cecum, most consistent with colon carcinoma. No bowel obstruction. 3. Left nephrolithiasis with presumed chronic left ureteropelvic junction. 4. Right larger than left adrenal lesions, by report similar back to 2013 and therefore presumed benign. 5. Hepatic morphology chest highly suspicious for mild cirrhosis. 6. Descending colostomy. Electronically Signed   By: Abigail Miyamoto M.D.   On: 04/02/2022 16:01   ECHOCARDIOGRAM COMPLETE  Result Date: 03/20/2022    ECHOCARDIOGRAM REPORT   Patient Name:   RAVIS HERNE Date of Exam: 03/20/2022 Medical Rec #:  [id number]    Height:       72.0 in Accession #:    [id number]   Weight:       176.3 lb Date of Birth:  10/12/1955    BSA:          2.019 m Patient Age:    80 years     BP:           129/64 mmHg Patient Gender: M            HR:           83 bpm. Exam Location:  [facility] Procedure: 2D Echo Indications:      CHF Acute Diastolic B44.96  History:         Patient has no prior history of Echocardiogram examinations.  Sonographer:     Kathlen Brunswick RDCS Referring Phys:  [place] Diagnosing Phys: Yolonda Kida MD  Sonographer Comments: Technically challenging study due to limited acoustic windows, no apical window and suboptimal parasternal window. Image acquisition challenging due to patient body habitus. Technically difficult study due to limited acoustic windows, no apicals and suboptimal parasternal images. IMPRESSIONS  1. Left ventricular ejection fraction, by estimation, is 55 to 60%. The left ventricle has normal function. The left ventricle has no regional wall motion abnormalities. Left ventricular diastolic parameters are consistent with Grade I diastolic  dysfunction (impaired relaxation).  2. Right ventricular systolic function is normal. The right ventricular size is normal.  3. The mitral valve is normal in structure. Mild mitral valve regurgitation.  4. The aortic valve is normal in structure. Aortic valve regurgitation is trivial. Conclusion(s)/Recommendation(s): Poor windows for evaluation of left ventricular function by transthoracic echocardiography. Would recommend an alternative means of evaluation. FINDINGS  Left Ventricle: Left ventricular ejection fraction, by estimation, is 55 to 60%. The left ventricle has normal function. The left ventricle has no regional wall motion abnormalities. The left ventricular internal cavity size was normal in size. There is  no left ventricular hypertrophy. Left ventricular diastolic parameters are consistent with Grade I diastolic dysfunction (impaired relaxation). Right Ventricle: The right ventricular size is normal. No increase in right ventricular wall thickness. Right ventricular systolic function is normal. Left Atrium: Left atrial size was normal in size. Right Atrium: Right atrial size was normal in size. Pericardium: There is no evidence  of pericardial effusion. Mitral Valve: The mitral valve is normal in structure. Mild mitral valve regurgitation. Tricuspid Valve: The tricuspid valve is normal in structure. Tricuspid valve regurgitation is trivial. Aortic Valve: The aortic valve is normal in structure. Aortic valve regurgitation is trivial. Aortic regurgitation PHT measures 546 msec. Aortic valve peak gradient measures 13.1 mmHg. Pulmonic Valve: The pulmonic valve was normal in structure. Pulmonic valve regurgitation is not visualized. Aorta: The ascending aorta was not well visualized. IAS/Shunts: No atrial level shunt detected by color flow Doppler.  AORTIC VALVE AV Vmax:      181.00 cm/s AV Peak Grad: 13.1 mmHg LVOT Vmax:    101.00 cm/s LVOT Vmean:   67.200 cm/s LVOT VTI:     0.213 m AI PHT:       546 msec MITRAL VALVE MV Area (PHT): 4.83 cm    SHUNTS MV Decel Time: 157 msec    Systemic VTI: 0.21 m MV E velocity: 66.40 cm/s MV A velocity: 74.10 cm/s MV E/A ratio:  0.90 Yolonda Kida MD Electronically signed by Yolonda Kida MD Signature Date/Time: 03/20/2022/4:03:54 PM    Final    CT Angio Chest PE W and/or Wo Contrast  Result Date: 03/19/2022 CLINICAL DATA:  Pulmonary embolism (PE) suspected, high prob. Dyspnea. Lung cancer. EXAM: CT ANGIOGRAPHY CHEST WITH CONTRAST TECHNIQUE: Multidetector CT imaging of the chest was performed using the standard protocol during bolus administration of intravenous contrast. Multiplanar CT image reconstructions and MIPs were obtained to evaluate the vascular anatomy. RADIATION DOSE REDUCTION: This exam was performed according to the departmental dose-optimization program which includes automated exposure control, adjustment of the mA and/or kV according to patient size and/or use of iterative reconstruction technique. CONTRAST:  22m OMNIPAQUE IOHEXOL 350 MG/ML SOLN COMPARISON:  12/29/2021 FINDINGS: Cardiovascular: A tiny web has developed within the right upper lobe pulmonary artery most in keeping  with the sequela of remote pulmonary embolus. There is no intraluminal filling defect identified, however, to suggest acute pulmonary embolism. The central pulmonary arteries are of normal caliber. Mild coronary artery calcification. Global cardiac size within normal limits. No pericardial effusion. Mild atherosclerotic calcification within the thoracic aorta. No aortic aneurysm. Left internal jugular chest port is in place with its tip at the superior cavoatrial junction. Mediastinum/Nodes: Visualized thyroid is unremarkable. Shotty right paratracheal adenopathy has minimally progressed in the interval since prior examination with the index lymph node measuring 9 mm at axial image # 28/4 (previously measuring 6 mm). Shotty adenopathy has also developed within the right  hilar and subcarinal lymph node groups. This is nonspecific and may reflect either nodal metastatic disease or reactive adenopathy given the inflammatory process within the right lung. Lungs/Pleura: Extensive cavitation in volume loss within the right upper lobe is again identified in appears grossly stable a small amount of layering fluid within the major cavity. There has developed, however, dense consolidation within the right lower lobe and extensive diffuse peribronchial nodularity with developing areas of parenchymal consolidation within the left lower lobe. Together, the findings are suspicious for acute infection, less likely aspiration. The findings are not typical of that associated with immunotherapy related pneumonitis. No pneumothorax or pleural effusion. Upper Abdomen: Right adrenal mass is unchanged measuring 22 mm x 27 mm. No acute abnormality. Musculoskeletal: No acute bone abnormality. Remote right fifth rib fracture and erosive changes within the right third and fourth ribs are again noted. No new lytic or blastic bone lesion. Review of the MIP images confirms the above findings. IMPRESSION: 1. No acute pulmonary embolism. Interval  development of a tiny web within the right upper lobe pulmonary artery most in keeping with the sequela of remote pulmonary embolus. 2. Interval development of dense consolidation within the right lower lobe and extensive diffuse peribronchial nodularity with developing areas of parenchymal consolidation within the left lower lobe. Together, the findings are suspicious for acute infection, less likely aspiration. The findings are not typical of that associated with immunotherapy related pneumonitis. Developing mediastinal adenopathy may represent reactive adenopathy but is nonspecific. 3. Stable cavitation and volume loss within the right upper lobe. 4. Stable right adrenal mass. Electronically Signed   By: Fidela Salisbury M.D.   On: 03/19/2022 00:08   DG Chest Portable 1 View  Result Date: 03/18/2022 CLINICAL DATA:  Chest pain. EXAM: PORTABLE CHEST 1 VIEW COMPARISON:  Chest radiograph dated 03/09/2022. FINDINGS: Left-sided Port-A-Cath in similar position. No interval change in the cavitary right upper lobe opacity. Diffuse interstitial nodular densities similar or progressed since the prior radiograph. Stable cardiomediastinal silhouette. No acute osseous pathology. IMPRESSION: 1. No interval change in the cavitary right upper lobe opacity. 2. Diffuse interstitial nodular densities similar or progressed since the prior radiograph. Electronically Signed   By: Anner Crete M.D.   On: 03/18/2022 23:09   DG Chest 2 View  Result Date: 03/10/2022 CLINICAL DATA:  Cough, lung cancer, on Keytruda. EXAM: CHEST - 2 VIEW COMPARISON:  01/05/2022 and CT chest 12/21/2021. FINDINGS: Trachea is deviated slightly to the right, as before. Left IJ power port tip is in the SVC. Thick-walled cavitation in the right upper lobe with an air-fluid level. The extent of soft tissue thickening inferiorly, in the right perihilar region, has increased from 01/05/2022. No airspace consolidation in the left lung. No pleural fluid.  IMPRESSION: Thick-walled cavitation with an air-fluid level in the right upper lobe, as before. The extent of soft tissue thickening along the inferior margin, in the right perihilar region, has increased in the interval. This may be due to superimposed pneumonia or progression of disease. Electronically Signed   By: Lorin Picket M.D.   On: 03/10/2022 10:59    ASSESSMENT & PLAN:   Primary cancer of right upper lobe of lung ([place]) # UNRESECTABLE/LOCALLY ADVANCED- Right upper lobe lung mass -concerning for malignancy [biopsy inconclusive/atypical cells]-  MARCH 2023-CT scan shows Necrotic mass of the right upper lobe; improvement of the pneumonic process. April 2023- PD-L1 positive [cirucologene].  Currently on single agent Keytruda. 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  No evidence of any distant metastatic disease.   # HOLD keytruda #4 today-given ongoing fatigue/overall stable imaging.Labs today reviewed;  acceptable for treatment today.  #Right chest wall pain-/pleuritic- ON  fenatny patch 25 mcg.  Oxycodone 5 mg every 6 hours as needed [3-4 a day]-STABLE.   # COPD/fatigue- [coughing/phlegm/ no fevers]- on albuterol/advair [smoking]- TSH- WNL;Continue  Xopenex/ipratropium nebs-; Mucinex- DM BID. 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 Bary Castilla.  Continue hold surgery given ongoing lung issues.  # Poorly controlled diabetes-blood sugars 113- 280;  Continue glipizide; and  Metformin. Monitor for now.   # IV mediport: functioning/STABLE  *appt thru mychart    # DISPOSITION:  # HOLD Keytruda today; today- IVFs over 1 hour.   #  in 1 week- IVFs-1 lit /1 hourTuesdays/Thursday   # in 2 weeks-NP- Sarah-  IVFs-1 lit /1 hourTuesdays/Thursday   # in 3 weeks-  IVFs-1 lit /1 hourTuesdays/Thursday   # follow up in 4 weeks-MD;- labs- cbc/cmp;possible Keytruda; IVFs over 1 hour; Dr.B   All questions were answered. The patient knows to call the clinic with any problems, questions or concerns.    [name], MD 04/08/2022 12:16 PM

## 2022-04-09 ENCOUNTER — Other Ambulatory Visit: Payer: Self-pay | Admitting: Internal Medicine

## 2022-04-09 MED ORDER — ERGOCALCIFEROL 1.25 MG (50000 UT) PO CAPS: 50000.0000 [IU] | ORAL_CAPSULE | ORAL | 1 refills | Status: DC

## 2022-04-09 NOTE — Progress Notes (Signed)
Sent scrpit for vit D. GB

## 2022-04-13 ENCOUNTER — Inpatient Hospital Stay: Payer: Medicare PPO

## 2022-04-13 ENCOUNTER — Ambulatory Visit: Payer: Medicare PPO

## 2022-04-13 VITALS — BP 122/70 | HR 102 | Temp 98.7°F | Wt 163.9 lb

## 2022-04-13 DIAGNOSIS — C3411 Malignant neoplasm of upper lobe, right bronchus or lung: Secondary | ICD-10-CM

## 2022-04-13 DIAGNOSIS — Z5112 Encounter for antineoplastic immunotherapy: Secondary | ICD-10-CM | POA: Diagnosis not present

## 2022-04-13 MED ORDER — SODIUM CHLORIDE 0.9% FLUSH
10.0000 mL | Freq: Once | INTRAVENOUS | Status: AC | PRN
  Administered 2022-04-13: 10 mL
  Filled 2022-04-13: qty 10

## 2022-04-13 MED ORDER — HEPARIN SOD (PORK) LOCK FLUSH 100 UNIT/ML IV SOLN
500.0000 [IU] | Freq: Once | INTRAVENOUS | Status: AC | PRN
  Administered 2022-04-13: 500 [IU]
  Filled 2022-04-13: qty 5

## 2022-04-13 MED ORDER — SODIUM CHLORIDE 0.9 % IV SOLN
Freq: Once | INTRAVENOUS | Status: AC
  Filled 2022-04-13: qty 250

## 2022-04-15 ENCOUNTER — Inpatient Hospital Stay: Payer: Medicare PPO

## 2022-04-15 VITALS — BP 128/71 | HR 105 | Temp 97.2°F | Resp 18

## 2022-04-15 DIAGNOSIS — Z5112 Encounter for antineoplastic immunotherapy: Secondary | ICD-10-CM | POA: Diagnosis not present

## 2022-04-15 DIAGNOSIS — Z95828 Presence of other vascular implants and grafts: Secondary | ICD-10-CM

## 2022-04-15 DIAGNOSIS — C3411 Malignant neoplasm of upper lobe, right bronchus or lung: Secondary | ICD-10-CM

## 2022-04-15 MED ORDER — SODIUM CHLORIDE 0.9 % IV SOLN
Freq: Once | INTRAVENOUS | Status: AC
  Filled 2022-04-15: qty 250

## 2022-04-15 MED ORDER — HEPARIN SOD (PORK) LOCK FLUSH 100 UNIT/ML IV SOLN
500.0000 [IU] | Freq: Once | INTRAVENOUS | Status: AC
  Administered 2022-04-15: 500 [IU]
  Filled 2022-04-15: qty 5

## 2022-04-15 MED ORDER — SODIUM CHLORIDE 0.9% FLUSH
10.0000 mL | Freq: Once | INTRAVENOUS | Status: AC
  Administered 2022-04-15: 10 mL via INTRAVENOUS
  Filled 2022-04-15: qty 10

## 2022-04-19 ENCOUNTER — Other Ambulatory Visit: Payer: Self-pay | Admitting: *Deleted

## 2022-04-19 MED ORDER — METFORMIN HCL 500 MG PO TABS
500.0000 mg | ORAL_TABLET | Freq: Two times a day (BID) | ORAL | 2 refills | Status: DC
Start: 2022-04-19 — End: 2022-08-03

## 2022-04-20 ENCOUNTER — Inpatient Hospital Stay (HOSPITAL_BASED_OUTPATIENT_CLINIC_OR_DEPARTMENT_OTHER): Payer: Medicare PPO | Admitting: Medical Oncology

## 2022-04-20 ENCOUNTER — Encounter: Payer: Self-pay | Admitting: Medical Oncology

## 2022-04-20 ENCOUNTER — Inpatient Hospital Stay: Payer: Medicare PPO

## 2022-04-20 ENCOUNTER — Other Ambulatory Visit: Payer: Self-pay | Admitting: *Deleted

## 2022-04-20 VITALS — BP 144/75 | HR 99 | Temp 98.4°F | Resp 16 | Wt 162.5 lb

## 2022-04-20 DIAGNOSIS — Z5112 Encounter for antineoplastic immunotherapy: Secondary | ICD-10-CM | POA: Diagnosis not present

## 2022-04-20 DIAGNOSIS — E860 Dehydration: Secondary | ICD-10-CM | POA: Diagnosis not present

## 2022-04-20 DIAGNOSIS — E876 Hypokalemia: Secondary | ICD-10-CM

## 2022-04-20 DIAGNOSIS — J189 Pneumonia, unspecified organism: Secondary | ICD-10-CM | POA: Diagnosis not present

## 2022-04-20 DIAGNOSIS — C3411 Malignant neoplasm of upper lobe, right bronchus or lung: Secondary | ICD-10-CM

## 2022-04-20 DIAGNOSIS — E162 Hypoglycemia, unspecified: Secondary | ICD-10-CM

## 2022-04-20 LAB — BASIC METABOLIC PANEL
Anion gap: 6 (ref 5–15)
BUN: 9 mg/dL (ref 8–23)
CO2: 26 mmol/L (ref 22–32)
Calcium: 8.9 mg/dL (ref 8.9–10.3)
Chloride: 107 mmol/L (ref 98–111)
Creatinine, Ser: 0.9 mg/dL (ref 0.61–1.24)
GFR, Estimated: >60 mL/min (ref >60)
Glucose, Bld: 59 mg/dL — ABNORMAL LOW (ref 70–99)
Potassium: 3.6 mmol/L (ref 3.5–5.1)
Sodium: 139 mmol/L (ref 135–145)

## 2022-04-20 MED ORDER — SODIUM CHLORIDE 0.9 % IV SOLN
Freq: Once | INTRAVENOUS | Status: AC
  Filled 2022-04-20: qty 250

## 2022-04-20 MED ORDER — HEPARIN SOD (PORK) LOCK FLUSH 100 UNIT/ML IV SOLN
500.0000 [IU] | Freq: Once | INTRAVENOUS | Status: AC | PRN
  Administered 2022-04-20: 500 [IU]
  Filled 2022-04-20: qty 5

## 2022-04-20 MED ORDER — SODIUM CHLORIDE 0.9% FLUSH
10.0000 mL | Freq: Once | INTRAVENOUS | Status: AC | PRN
  Administered 2022-04-20: 10 mL
  Filled 2022-04-20: qty 10

## 2022-04-20 NOTE — Progress Notes (Signed)
[facility] NOTE  Patient Care Team: [name], MD as PCP - General (Family Medicine) [name], RN as Oncology Nurse Navigator [name], MD as Consulting Physician (Urology) [name], MD as Consulting Physician (Oncology)  CHIEF COMPLAINTS/PURPOSE OF CONSULTATION: lung cancer    Oncology History Overview Note  AUG 2022- 8.2 x 5.9 cm spiculated mass noted in right upper lobe consistent with malignancy. It extends from the right hilum to the lateral chest wall and results in lytic destruction of the lateral portion of the right fourth rib.  Mediastinal lymph nodes are noted, including 12 mm right hilar lymph node, consistent with metastatic disease. 3 cm right adrenal mass is noted concerning for metastatic disease.  # AUG 2022- PET IMPRESSION: Peripherally hypermetabolic right upper lobe mass, likely due to central necrosis. Mass extends into the right hilum and right lateral chest wall involving the second through fourth right ribs.   Mildly enlarged and hypermetabolic right hilar lymph node.   No evidence of metastatic disease in the abdomen or pelvis.  # AUG, 2022-RIGHT CHEST WALL Bx-necrosis; suspicious for malignancy not definitive [discussed with Dr.Kraynie;MDT] LUNG MASS, RIGHT; BIOPSY:  - SUSPICIOUS FOR MALIGNANCY.  - SEE COMMENT.   Comment:  Approximately six tissue cores demonstrate inflamed fibrous capsule with  hemosiderin-laden macrophages, in a background of abundant necrosis.  One of the cores demonstrates a minute fragment of viable epithelial  cells.  These cells are positive for p40.  They are negative for TTF1  and CK7. The cells of interest disappear on deeper cut levels.  While  the findings are suspicious for squamous cell carcinoma, there is not  enough viable tumor for a definitive diagnosis.  Additional tissue  sampling may be helpful.   # SEP 2022-cycle #2 Taxol hypersensitive reaction.  Switched  over to #3 cycle- carbo-Abraxane starting 06/29/2021  # DEC 2022- s/p ID- Dr.ravishankar- ? Lung abscess-no antibiotics-  JAN 2023- CT scan incidental- cecal mass- colonoscopy[Dr.Russo; [facility]- high grade dysplasia]  S/p evaluation with Dr. [name] surgery for now]   # S/p evaluation with Dr. [name].   Primary cancer of right upper lobe of lung ([place])  06/03/2021 Initial Diagnosis   Primary cancer of right upper lobe of lung ([place])   06/03/2021 Cancer Staging   Staging form: Lung, AJCC 8th Edition - Clinical: Stage IIIB (cT3, cN2, cM0) - Signed by [name], MD on 06/03/2021   06/15/2021 - 08/03/2021 Chemotherapy   Patient is on Treatment Plan : LUNG Carboplatin / Paclitaxel + XRT q7d     10/02/2021 - 10/19/2021 Chemotherapy   Patient is on Treatment Plan : LUNG Durvalumab q14d     02/04/2022 -  Chemotherapy   Patient is on Treatment Plan : LUNG NSCLC Pembrolizumab (200) q21d        HISTORY OF PRESENTING ILLNESS: Patient is ambulating independently.  Accompanied by wife.  Rodney Vega 66 y.o.  male history of smoking -"lung cancer" [Limited necrotic tissue]-locally advanced unresectable currently on single agent Keytruda/CT scan.  Following up on pneumonia. [name] was held previously due to this. Overall feeling better. Appetite and energy maybe slightly better over the past few days. Still feels dehydrated from lower oral intake. No worsening SOB, no fever.    Review of Systems  Constitutional:  Positive for malaise/fatigue and weight loss. Negative for chills, diaphoresis and fever.  HENT:  Negative for nosebleeds and sore throat.   Eyes:  Negative for double vision.  Respiratory:  Positive for cough, sputum production and shortness of breath. Negative for hemoptysis and wheezing.   Cardiovascular:  Positive for chest pain. Negative for palpitations, orthopnea and leg swelling.  Gastrointestinal:  Positive for nausea. Negative for abdominal pain, blood in stool,  constipation, diarrhea, heartburn, melena and vomiting.  Genitourinary:  Negative for dysuria, frequency and urgency.  Musculoskeletal:  Negative for back pain and joint pain.  Skin: Negative.  Negative for itching and rash.  Neurological:  Positive for tingling. Negative for dizziness, focal weakness, weakness and headaches.  Endo/Heme/Allergies:  Does not bruise/bleed easily.  Psychiatric/Behavioral:  Negative for depression. The patient is not nervous/anxious and does not have insomnia.      MEDICAL HISTORY:  Past Medical History:  Diagnosis Date   Cancer ([place])    Degenerative arthritis of knee    Depression    Diabetes mellitus without complication ([place])    Diverticulitis    Glaucoma    since 2004   Hernia 1979   History of kidney stones    Hypertension    Personal history of tobacco use, presenting hazards to health    Pre-diabetes    Screening for obesity    Special screening for malignant neoplasms, colon    Wears dentures    full upper and lower    SURGICAL HISTORY: Past Surgical History:  Procedure Laterality Date   [place], 2012   lumbar bulging disc   CATARACT EXTRACTION W/PHACO Left 01/20/2021   Procedure: CATARACT EXTRACTION PHACO AND INTRAOCULAR LENS PLACEMENT ([place]) LEFT;  Surgeon: Birder Robson, MD;  Location: [facility];  Service: Ophthalmology;  Laterality: Left;  10.13 0:52.1   COLONOSCOPY  [id number]   [facility]/Dr. Buelah Manis sessile polyp,69mm, found in rectum,multiple diverticula were found in the sigmoid, in descending and in transverse colon   COLONOSCOPY WITH PROPOFOL N/A 10/22/2021   Procedure: COLONOSCOPY WITH PROPOFOL;  Surgeon: Annamaria Helling, DO;  Location: [facility] ENDOSCOPY;  Service: Gastroenterology;  Laterality: N/A;  DM   COLOSTOMY  04-20-13   HERNIA REPAIR  1979   inguinal   IR IMAGING GUIDED PORT INSERTION  06/10/2021   JOINT REPLACEMENT Right    knee   KNEE ARTHROPLASTY Right 05/08/2018   Procedure: COMPUTER ASSISTED  TOTAL KNEE ARTHROPLASTY;  Surgeon: Dereck Leep, MD;  Location: [facility] ORS;  Service: Orthopedics;  Laterality: Right;   KNEE ARTHROPLASTY Left 11/16/2019   Procedure: COMPUTER ASSISTED TOTAL KNEE ARTHROPLASTY;  Surgeon: Dereck Leep, MD;  Location: [facility] ORS;  Service: Orthopedics;  Laterality: Left;   LAPAROTOMY  04-20-13   colon resection with colosotmy   LITHOTRIPSY  2013   TONSILLECTOMY AND ADENOIDECTOMY  1962    SOCIAL HISTORY: Social History   Socioeconomic History   Marital status: Married    Spouse name: Not on file   Number of children: Not on file   Years of education: Not on file   Highest education level: Not on file  Occupational History   Not on file  Tobacco Use   Smoking status: Every Day    Packs/day: 1.00    Years: 30.00    Total pack years: 30.00    Types: Cigarettes   Smokeless tobacco: Never   Tobacco comments:    since age 4 or 31  Vaping Use   Vaping Use: Never used  Substance and Sexual Activity   Alcohol use: Yes    Alcohol/week: 2.0 standard drinks of alcohol    Types: 2 Standard drinks or equivalent per week  Comment: 1-2 drinks per week   Drug use: Yes    Types: Marijuana   Sexual activity: Not on file  Other Topics Concern   Not on file  Social History Narrative   15 mins [place]; smoking; not much alcohol. 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  MEDICATIONS:  Current Outpatient Medications   Medication Sig Dispense Refill   acetaminophen (TYLENOL) 500 MG tablet Take 2 tablets (1,000 mg total) by mouth daily as needed. Home med. 30 tablet 0   albuterol (PROVENTIL) (2.5 MG/3ML) 0.083% nebulizer solution Take 2.5 mg by nebulization every 4 (four) hours as needed for wheezing or shortness of breath.     albuterol (VENTOLIN HFA) 108 (90 Base) MCG/ACT inhaler SMARTSIG:2 inhalation Via Inhaler Every 6 Hours PRN     baclofen (LIORESAL) 10 MG tablet Take 5 mg by mouth at bedtime as needed for muscle spasms.     blood glucose meter kit and supplies KIT Check your blood glucose levels twice a day; once in the morning before breakfast; and once in the evening after dinner. 1 each 0   BREZTRI AEROSPHERE 160-9-4.8 MCG/ACT AERO Inhale 2 puffs into the lungs 2 (two) times daily.     buPROPion (WELLBUTRIN XL) 150 MG 24 hr tablet Take 1 tablet (150 mg total) by mouth daily. 30 tablet 2   cetirizine (ZYRTEC) 10 MG tablet Take 10 mg by mouth daily.     dextromethorphan-guaiFENesin (MUCINEX DM) 30-600 MG 12hr tablet Take 1 tablet by mouth 2 (two) times daily as needed for cough.     diphenhydramine-acetaminophen (TYLENOL PM) 25-500 MG TABS Take 1-2 tablets by mouth at bedtime as needed (sleep.).      dorzolamide-timolol (COSOPT) 22.3-6.8 MG/ML ophthalmic solution Place 1 drop into both eyes 2 (two) times daily.     dronabinol (MARINOL) 5 MG capsule Take 1 capsule (5 mg total) by mouth 2 (two) times daily before lunch and supper. 60 capsule 1   ergocalciferol (VITAMIN D2) 1.25 MG (50000 UT) capsule Take 1 capsule (50,000 Units total) by mouth once a week. 12 capsule 1   eszopiclone (LUNESTA) 1 MG TABS tablet TAKE 1 TABLET(1 MG) BY MOUTH AT BEDTIME AS NEEDED FOR SLEEP 30 tablet 1   feeding supplement (ENSURE ENLIVE / ENSURE PLUS) LIQD Take 237 mLs by mouth 3 (three) times daily between meals.     glipiZIDE (GLUCOTROL) 5 MG tablet TAKE 1 TABLET(5 MG) BY MOUTH DAILY BEFORE BREAKFAST 90 tablet 2   glucose  blood test strip Check your blood glucose levels twice a day; once in the morning before breakfast; and once in the evening after dinner. 100 each 12   Lancets (FREESTYLE) lancets Check your blood glucose levels twice a day; once in the morning before breakfast; and once in the evening after dinner. 100 each 12   latanoprost (XALATAN) 0.005 % ophthalmic solution Place 1 drop into  both eyes at bedtime.     lidocaine-prilocaine (EMLA) cream Apply 1 application. topically as needed. 30 g 3   metFORMIN (GLUCOPHAGE) 500 MG tablet Take 1 tablet (500 mg total) by mouth 2 (two) times daily with a meal. 60 tablet 2   montelukast (SINGULAIR) 10 MG tablet TAKE 1 TABLET(10 MG) BY MOUTH AT BEDTIME 90 tablet 0   Multiple Vitamin (MULTIVITAMIN WITH MINERALS) TABS tablet Take 1 tablet by mouth daily.     oxyCODONE (OXY IR/ROXICODONE) 5 MG immediate release tablet Take 1 tablet (5 mg total) by mouth every 6 (six) hours as needed for severe pain. 90 tablet 0   pregabalin (LYRICA) 50 MG capsule TAKE 1 CAPSULE(50 MG) BY MOUTH TWICE DAILY 60 capsule 1   propranolol ER (INDERAL LA) 60 MG 24 hr capsule Take 1 capsule (60 mg total) by mouth daily. 30 capsule 3   Respiratory Therapy Supplies (NEBULIZER) DEVI Use as directed 1 each 0   sulfamethoxazole-trimethoprim (BACTRIM) 400-80 MG tablet Take 1 tablet by mouth daily at 12 noon.     tamsulosin (FLOMAX) 0.4 MG CAPS capsule Take 0.4 mg by mouth daily.     venlafaxine XR (EFFEXOR-XR) 75 MG 24 hr capsule Take 225 mg by mouth daily.     vitamin B-12 (CYANOCOBALAMIN) 500 MCG tablet Take 1 tablet by mouth daily.     WIXELA INHUB 500-50 MCG/ACT AEPB INHALE 1 PUFF INTO THE LUNGS IN THE MORNING AND AT BEDTIME 60 each 1   chlorpheniramine-HYDROcodone 10-8 MG/5ML Take 5 mLs by mouth at bedtime as needed for cough. (Patient not taking: Reported on 04/20/2022) 140 mL 0   fentaNYL (DURAGESIC) 25 MCG/HR Place 1 patch onto the skin every 3 (three) days. (Patient not taking: Reported on  04/20/2022) 10 patch 0   guaiFENesin-codeine 100-10 MG/5ML syrup Take 5 mLs by mouth daily as needed for cough. 120 mL 0   potassium chloride SA (KLOR-CON M) 20 MEQ tablet Take 1 tablet (20 mEq total) by mouth daily. (Patient not taking: Reported on 04/20/2022) 15 tablet 0   No current facility-administered medications for this visit.   Facility-Administered Medications Ordered in Other Visits  Medication Dose Route Frequency Provider Last Rate Last Admin   heparin lock flush 100 UNIT/ML injection            heparin lock flush 100 UNIT/ML injection            heparin lock flush 100 UNIT/ML injection               .  PHYSICAL EXAMINATION: ECOG PERFORMANCE STATUS: 1 - Symptomatic but completely ambulatory  Vitals:   04/20/22 1353  BP: (!) 144/75  Pulse: 99  Resp: 16  Temp: 98.4 F (36.9 C)     Filed Weights   04/20/22 1353  Weight: 162 lb 8 oz (73.7 kg)      Physical Exam Vitals and nursing note reviewed.  HENT:     Head: Normocephalic and atraumatic.     Mouth/Throat:     Pharynx: Oropharynx is clear.  Eyes:     Extraocular Movements: Extraocular movements intact.     Pupils: Pupils are equal, round, and reactive to light.  Cardiovascular:     Rate and Rhythm: Normal rate and regular rhythm.  Pulmonary:     Breath sounds: Rhonchi present.     Comments: Scant decreased breath sounds of the right lower  Abdominal:     Palpations: Abdomen is soft.  Musculoskeletal:  General: Normal range of motion.     Cervical back: Normal range of motion.  Skin:    General: Skin is warm.  Neurological:     General: No focal deficit present.     Mental Status: He is alert and oriented to person, place, and time.  Psychiatric:        Behavior: Behavior normal.        Judgment: Judgment normal.      LABORATORY DATA:  I have reviewed the data as listed Lab Results  Component Value Date   WBC 8.8 04/08/2022   HGB 11.2 (L) 04/08/2022   HCT 34.7 (L) 04/08/2022    MCV 87.2 04/08/2022   PLT 203 04/08/2022   Recent Labs    03/18/22 0845 03/18/22 2230 03/19/22 0610 04/08/22 0904 04/20/22 1319  NA 136 139 140 133* 139  K 3.4* 4.2 4.0 3.4* 3.6  CL 101 106 107 100 107  CO2 $Re'25 25 24 25 26  'fxw$ GLUCOSE 214* 114* 209* 240* 59*  BUN $Re'9 8 11 'BrE$ 7* 9  CREATININE 0.88 0.69 0.67 0.83 0.90  CALCIUM 8.3* 8.3* 8.6* 8.4* 8.9  GFRNONAA >60 >60 >60 >60 >60  PROT 6.5 6.3*  --  6.9  --   ALBUMIN 2.5* 2.3*  --  2.9*  --   AST 22 25  --  17  --   ALT 12 12  --  10  --   ALKPHOS 94 84  --  90  --   BILITOT 0.2* 0.8  --  0.3  --     RADIOGRAPHIC STUDIES: I have personally reviewed the radiological images as listed and agreed with the findings in the report. CT CHEST ABDOMEN PELVIS W CONTRAST  Result Date: 04/02/2022 CLINICAL DATA:  Non-small-cell lung cancer. Evaluate treatment response. Right upper lobe primary. Cecal cancer. COPD. * Tracking Code: BO * EXAM: CT CHEST, ABDOMEN, AND PELVIS WITH CONTRAST TECHNIQUE: Multidetector CT imaging of the chest, abdomen and pelvis was performed following the standard protocol during bolus administration of intravenous contrast. RADIATION DOSE REDUCTION: This exam was performed according to the departmental dose-optimization program which includes automated exposure control, adjustment of the mA and/or kV according to patient size and/or use of iterative reconstruction technique. CONTRAST:  165mL OMNIPAQUE IOHEXOL 300 MG/ML  SOLN COMPARISON:  CTA chest of 03/18/2022. Most recent staging exams of 12/21/2021. FINDINGS: CT CHEST FINDINGS Cardiovascular: Left Port-A-Cath tip terminates at superior caval/atrial junction. Aortic atherosclerosis. Tortuous thoracic aorta. Normal heart size, without pericardial effusion. Lad and right coronary artery calcification. No central pulmonary embolism, on this non-dedicated study. Mediastinum/Nodes: No supraclavicular adenopathy. Subcarinal node measures 9 mm on 27/2 versus 10 mm on the 03/18/2022 exam  (when remeasured). The right paratracheal node detailed on the prior exam is decreased in size at 7 mm today versus 9 mm on the prior. No hilar adenopathy. Lungs/Pleura: Similar trace right pleural fluid. Volume loss in the right hemithorax with tracheal deviation right. Again identified is a thick walled cavitary process within the right apex and upper chest, similar in configuration to on the recent CTA and comparison CT. Diffuse peribronchovascular nodularity and interstitial thickening are again identified. Improvement in right lower lobe aeration with residual areas of dependent consolidation compared to 03/18/2022. Progressive compared to 12/21/2021. Posterior left upper lobe nodular consolidation including on 36/4, slightly decreased compared to 03/18/2022. A pleural-based left upper lobe pulmonary nodule laterally measures 8 mm on 38/4 and is similar to on the prior. Left lower lobe 1.0 cm  nodule on 132/4 is decreased from 1.5 cm on 03/18/2022 (when remeasured). Musculoskeletal: Irregularity and cortical erosion involving the third anterolateral right rib is similar. Partial destruction of the lateral right fourth rib is unchanged. Minimally displaced fracture of the posterolateral right fifth rib again identified, without significant healing. CT ABDOMEN PELVIS FINDINGS Hepatobiliary: Mildly irregular hepatic capsule with caudate and lateral segment left liver lobe enlargement. Normal gallbladder, without biliary ductal dilatation. Pancreas: Normal, without mass or ductal dilatation. Spleen: Normal in size, without focal abnormality. Adrenals/Urinary Tract: Right greater than left adrenal enlargement including at maximally 2.1 cm on the right, similar to on the prior exam and per that report, similar back to 2013. Multiple left renal collecting system calculi, including layering stones within the left renal pelvis. Prominence of the left renal pelvis, without hydroureter or bladder abnormality. Normal right  kidney. Stomach/Bowel: Proximal gastric underdistention. Hartmann's pouch. Descending colostomy. Mass centered at the cecum, about the appendiceal orifice, measures 3.3 x 2.7 cm on 96/2 and is similar to 3.5 x 3.1 cm on the prior. No bowel obstruction. The appendix is similarly dilated at 11 mm without surrounding inflammation. Normal small bowel. Vascular/Lymphatic: Aortic atherosclerosis. No abdominopelvic adenopathy. Reproductive: Normal prostate. Other: No significant free fluid. No evidence of omental or peritoneal disease. Musculoskeletal:  Lumbosacral spondylosis. IMPRESSION: CT CHEST IMPRESSION 1. 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 2. Since the CTA of 03/18/2022, improved appearance of the remainder of the lungs, likely related to chronic atypical infectious process versus aspiration. 3. Mild decrease in thoracic nodal size, favoring a reactive etiology. 4. Similar third through fifth rib abnormalities which could be due to tumor invasion and/or radiation induced necrosis. 5. Aortic atherosclerosis (ICD10-I70.0), coronary artery atherosclerosis and emphysema (ICD10-J43.9). CT ABDOMEN AND PELVIS IMPRESSION 1. No evidence of metastatic disease within the abdomen or pelvis. 2. Similar mass at the base of the cecum, most consistent with colon carcinoma. No bowel obstruction. 3. Left nephrolithiasis with presumed chronic left ureteropelvic junction. 4. Right larger than left adrenal lesions, by report similar back to 2013 and therefore presumed benign. 5. Hepatic morphology chest highly suspicious for mild cirrhosis. 6. Descending colostomy. Electronically Signed   By: Abigail Miyamoto M.D.   On: 04/02/2022 16:01    ASSESSMENT & PLAN:   No problem-specific Assessment & Plan notes found for this encounter.  Encounter Diagnoses  Name Primary?   Hypokalemia    Primary cancer of right  upper lobe of lung ([place]) Yes   Community acquired pneumonia of right upper lobe of lung    Dehydration    Hypoglycemia    Resolved. Continue oral potassium supplementation Chronic. Holding Keytruda secondary to pneumonia and poor functional status. Will re-evaluate at next MD visit in 2 weeks.  CAP: Resolving. Reviewed red flags Acute on chronic. Secondary to poor oral intake. 1LIVF today. Continue oral hydration at home. Follow up in 1 week for IVF New. Due to lack or oral intake. Soda given in clinic. He was asymptomatic. He will try to consume someone with glucose every 2 hours to prevent. Not on insulin lowering medications.   All questions were answered. The patient knows to call the clinic with any problems, questions or concerns.    Hughie Closs, PA-C 04/20/2022 3:56 PM

## 2022-04-20 NOTE — Progress Notes (Signed)
Pt and wife in for follow up, reports still no appetite, weight down another 1.5 lbs, not drinking ensure or boost currently. Reports decreased fluid intake as well.  Pt currently on Bactrim for respiratory infection.  Pt is not taking potassium currently.

## 2022-04-21 MED FILL — Dexamethasone Sodium Phosphate Inj 100 MG/10ML: INTRAMUSCULAR | Qty: 1 | Status: AC

## 2022-04-22 ENCOUNTER — Inpatient Hospital Stay: Payer: Medicare PPO

## 2022-04-22 VITALS — BP 130/76 | HR 91 | Temp 98.3°F | Wt 161.8 lb

## 2022-04-22 DIAGNOSIS — C3411 Malignant neoplasm of upper lobe, right bronchus or lung: Secondary | ICD-10-CM

## 2022-04-22 DIAGNOSIS — Z5112 Encounter for antineoplastic immunotherapy: Secondary | ICD-10-CM | POA: Diagnosis not present

## 2022-04-22 MED ORDER — ONDANSETRON HCL 4 MG/2ML IJ SOLN
8.0000 mg | Freq: Once | INTRAMUSCULAR | Status: AC
  Administered 2022-04-22: 8 mg via INTRAVENOUS

## 2022-04-22 MED ORDER — SODIUM CHLORIDE 0.9 % IV SOLN
Freq: Once | INTRAVENOUS | Status: AC
  Filled 2022-04-22: qty 250

## 2022-04-22 MED ORDER — SODIUM CHLORIDE 0.9 % IV SOLN: Freq: Once | INTRAVENOUS | Status: DC

## 2022-04-22 MED ORDER — HEPARIN SOD (PORK) LOCK FLUSH 100 UNIT/ML IV SOLN
500.0000 [IU] | Freq: Once | INTRAVENOUS | Status: AC | PRN
  Administered 2022-04-22: 500 [IU]
  Filled 2022-04-22: qty 5

## 2022-04-22 MED ORDER — SODIUM CHLORIDE 0.9% FLUSH
10.0000 mL | Freq: Once | INTRAVENOUS | Status: AC | PRN
  Administered 2022-04-22: 10 mL
  Filled 2022-04-22: qty 10

## 2022-04-22 NOTE — Progress Notes (Signed)
Recived 1 L NS for hydration. Wife reports decreasing appetite. Increased weakness. Discharged to home at completion.

## 2022-04-26 ENCOUNTER — Other Ambulatory Visit: Payer: Self-pay | Admitting: Pulmonary Disease

## 2022-04-26 DIAGNOSIS — C3411 Malignant neoplasm of upper lobe, right bronchus or lung: Secondary | ICD-10-CM

## 2022-04-27 ENCOUNTER — Inpatient Hospital Stay: Payer: Medicare PPO

## 2022-04-27 DIAGNOSIS — Z5112 Encounter for antineoplastic immunotherapy: Secondary | ICD-10-CM | POA: Diagnosis not present

## 2022-04-27 DIAGNOSIS — C3411 Malignant neoplasm of upper lobe, right bronchus or lung: Secondary | ICD-10-CM

## 2022-04-27 MED ORDER — SODIUM CHLORIDE 0.9% FLUSH
10.0000 mL | Freq: Once | INTRAVENOUS | Status: AC | PRN
  Administered 2022-04-27: 10 mL
  Filled 2022-04-27: qty 10

## 2022-04-27 MED ORDER — SODIUM CHLORIDE 0.9 % IV SOLN
Freq: Once | INTRAVENOUS | Status: AC
  Filled 2022-04-27: qty 250

## 2022-04-27 MED ORDER — HEPARIN SOD (PORK) LOCK FLUSH 100 UNIT/ML IV SOLN
500.0000 [IU] | Freq: Once | INTRAVENOUS | Status: AC | PRN
  Administered 2022-04-27: 500 [IU]
  Filled 2022-04-27: qty 5

## 2022-04-28 ENCOUNTER — Encounter
Admission: RE | Admit: 2022-04-28 | Discharge: 2022-04-28 | Disposition: A | Discharge status: Home / Self Care | Payer: Medicare PPO | Source: Ambulatory Visit | Attending: Pulmonary Disease | Admitting: Pulmonary Disease

## 2022-04-28 ENCOUNTER — Other Ambulatory Visit: Payer: Self-pay

## 2022-04-28 DIAGNOSIS — Z01812 Encounter for preprocedural laboratory examination: Secondary | ICD-10-CM

## 2022-04-28 DIAGNOSIS — D649 Anemia, unspecified: Secondary | ICD-10-CM

## 2022-04-28 DIAGNOSIS — I10 Essential (primary) hypertension: Secondary | ICD-10-CM

## 2022-04-28 DIAGNOSIS — E119 Type 2 diabetes mellitus without complications: Secondary | ICD-10-CM

## 2022-04-28 HISTORY — DX: Pneumonia, unspecified organism: J18.9

## 2022-04-28 HISTORY — DX: Anemia, unspecified: D64.9

## 2022-04-28 HISTORY — DX: Chronic obstructive pulmonary disease, unspecified: J44.9

## 2022-04-28 HISTORY — DX: Myoneural disorder, unspecified: G70.9

## 2022-04-28 MED FILL — Dexamethasone Sodium Phosphate Inj 100 MG/10ML: INTRAMUSCULAR | Qty: 1 | Status: AC

## 2022-04-28 NOTE — Patient Instructions (Addendum)
Your procedure is scheduled on: 05/05/22 - Wednesday Report to the Registration Desk on the 1st floor of the [facility]. To find out your arrival time, please call [phone] between 1PM - 3PM on: 05/04/22 - Tuesday If your arrival time is 6:00 am, do not arrive prior to that time as the Lane entrance doors do not open until 6:00 am. - Covid Test at the [facility] on 05/03/22 at 10:00 am .   REMEMBER: Instructions that are not followed completely may result in serious medical risk, up to and including death; or upon the discretion of your surgeon and anesthesiologist your surgery may need to be rescheduled.  Do not eat food or drink any fluids after midnight the night before surgery.  No gum chewing, lozengers or hard candies.  TAKE THESE MEDICATIONS THE MORNING OF SURGERY WITH A SIP OF WATER:  - BREZTRI  - dorzolamide-timolol (COSOPT)  - propranolol ER (INDERAL LA)  - venlafaxine XR (EFFEXOR-XR)  - sulfamethoxazole-trimethoprim (BACTRIM)   Stop taking metFORMIN (GLUCOPHAGE) beginning 05/03/22, may resume the day after surgery.  Use inhaler albuterol (VENTOLIN HFA) 108 on the day of surgery and bring to the hospital.  One week prior to surgery: Stop Anti-inflammatories (NSAIDS) such as Advil, Aleve, Ibuprofen, Motrin, Naproxen, Naprosyn and Aspirin based products such as Excedrin, Goodys Powder, BC Powder.   Stop ANY OVER THE COUNTER supplements until after surgery: Multiple Vitamin , ergocalciferol .  You may however, continue to take Tylenol if needed for pain up until the day of surgery.  No Alcohol for 24 hours before or after surgery.  No Smoking including e-cigarettes for 24 hours prior to surgery.  No chewable tobacco products for at least 6 hours prior to surgery.  No nicotine patches on the day of surgery.  Do not use any "recreational" drugs for at least a week prior to your surgery.  Please be advised that the combination of cocaine and  anesthesia may have negative outcomes, up to and including death. If you test positive for cocaine, your surgery will be cancelled.  On the morning of surgery brush your teeth with toothpaste and water, you may rinse your mouth with mouthwash if you wish. Do not swallow any toothpaste or mouthwash.  Do not wear jewelry, make-up, hairpins, clips or nail polish.  Do not wear lotions, powders, or perfumes.   Do not shave body from the neck down 48 hours prior to surgery just in case you cut yourself which could leave a site for infection.  Also, freshly shaved skin may become irritated if using the CHG soap.  Contact lenses, hearing aids and dentures may not be worn into surgery.  Do not bring valuables to the hospital. [facility] is not responsible for any missing/lost belongings or valuables.   Notify your doctor if there is any change in your medical condition (cold, fever, infection).  Wear comfortable clothing (specific to your surgery type) to the hospital.  After surgery, you can help prevent lung complications by doing breathing exercises.  Take deep breaths and cough every 1-2 hours. Your doctor may order a device called an Incentive Spirometer to help you take deep breaths. When coughing or sneezing, hold a pillow firmly against your incision with both hands. This is called "splinting." Doing this helps protect your incision. It also decreases belly discomfort.  If you are being admitted to the hospital overnight, leave your suitcase in the car. After surgery it may be brought to your room.  If you are being discharged the day of surgery, you will not be allowed to drive home. You will need a responsible adult (18 years or older) to drive you home and stay with you that night.   If you are taking public transportation, you will need to have a responsible adult (18 years or older) with you. Please confirm with your physician that it is acceptable to use public transportation.    Please call the [facility] Dept. at [phone] if you have any questions about these instructions.  Surgery Visitation Policy:  Patients undergoing a surgery or procedure may have two family members or support persons with them as long as the person is not COVID-19 positive or experiencing its symptoms.   Inpatient Visitation:    Visiting hours are 7 a.m. to 8 p.m. Up to four visitors are allowed at one time in a patient room, including children. The visitors may rotate out with other people during the day. One designated support person (adult) may remain overnight.

## 2022-04-29 ENCOUNTER — Ambulatory Visit
Admission: RE | Admit: 2022-04-29 | Discharge: 2022-04-29 | Disposition: A | Discharge status: Home / Self Care | Payer: Medicare PPO | Source: Ambulatory Visit | Attending: Pulmonary Disease | Admitting: Pulmonary Disease

## 2022-04-29 ENCOUNTER — Inpatient Hospital Stay: Payer: Medicare PPO

## 2022-04-29 VITALS — BP 149/80 | HR 98 | Resp 18 | Wt 161.5 lb

## 2022-04-29 DIAGNOSIS — C3411 Malignant neoplasm of upper lobe, right bronchus or lung: Secondary | ICD-10-CM | POA: Diagnosis present

## 2022-04-29 DIAGNOSIS — Z5112 Encounter for antineoplastic immunotherapy: Secondary | ICD-10-CM | POA: Diagnosis not present

## 2022-04-29 MED ORDER — SODIUM CHLORIDE 0.9 % IV SOLN: Freq: Once | INTRAVENOUS | Status: DC

## 2022-04-29 MED ORDER — ONDANSETRON HCL 4 MG/2ML IJ SOLN: 8.0000 mg | Freq: Once | INTRAMUSCULAR | Status: DC

## 2022-04-29 MED ORDER — HEPARIN SOD (PORK) LOCK FLUSH 100 UNIT/ML IV SOLN
500.0000 [IU] | Freq: Once | INTRAVENOUS | Status: AC | PRN
  Administered 2022-04-29: 500 [IU]
  Filled 2022-04-29: qty 5

## 2022-04-29 MED ORDER — SODIUM CHLORIDE 0.9% FLUSH
10.0000 mL | Freq: Once | INTRAVENOUS | Status: AC | PRN
  Administered 2022-04-29: 10 mL
  Filled 2022-04-29: qty 10

## 2022-04-29 MED ORDER — SODIUM CHLORIDE 0.9 % IV SOLN
Freq: Once | INTRAVENOUS | Status: AC
  Filled 2022-04-29: qty 250

## 2022-04-30 MED FILL — Dexamethasone Sodium Phosphate Inj 100 MG/10ML: INTRAMUSCULAR | Qty: 1 | Status: AC

## 2022-05-03 ENCOUNTER — Inpatient Hospital Stay: Payer: Medicare PPO

## 2022-05-03 ENCOUNTER — Encounter
Admission: RE | Admit: 2022-05-03 | Discharge: 2022-05-03 | Disposition: A | Discharge status: Home / Self Care | Payer: Medicare PPO | Source: Ambulatory Visit | Attending: Pulmonary Disease | Admitting: Pulmonary Disease

## 2022-05-03 VITALS — BP 135/79 | HR 84 | Temp 98.5°F | Ht 72.0 in | Wt 162.0 lb

## 2022-05-03 DIAGNOSIS — I10 Essential (primary) hypertension: Secondary | ICD-10-CM | POA: Insufficient documentation

## 2022-05-03 DIAGNOSIS — Z20822 Contact with and (suspected) exposure to covid-19: Secondary | ICD-10-CM | POA: Diagnosis not present

## 2022-05-03 DIAGNOSIS — R918 Other nonspecific abnormal finding of lung field: Secondary | ICD-10-CM | POA: Insufficient documentation

## 2022-05-03 DIAGNOSIS — Z01818 Encounter for other preprocedural examination: Secondary | ICD-10-CM | POA: Insufficient documentation

## 2022-05-03 DIAGNOSIS — Z95828 Presence of other vascular implants and grafts: Secondary | ICD-10-CM

## 2022-05-03 DIAGNOSIS — Z01812 Encounter for preprocedural laboratory examination: Secondary | ICD-10-CM

## 2022-05-03 DIAGNOSIS — Z5112 Encounter for antineoplastic immunotherapy: Secondary | ICD-10-CM | POA: Diagnosis not present

## 2022-05-03 DIAGNOSIS — C3411 Malignant neoplasm of upper lobe, right bronchus or lung: Secondary | ICD-10-CM

## 2022-05-03 DIAGNOSIS — D649 Anemia, unspecified: Secondary | ICD-10-CM | POA: Diagnosis not present

## 2022-05-03 LAB — PROTIME-INR
INR: 1.1 (ref 0.8–1.2)
Prothrombin Time: 14.3 seconds (ref 11.4–15.2)

## 2022-05-03 LAB — CBC
HCT: 36.3 % — ABNORMAL LOW (ref 39.0–52.0)
Hemoglobin: 11.4 g/dL — ABNORMAL LOW (ref 13.0–17.0)
MCH: 27.1 pg (ref 26.0–34.0)
MCHC: 31.4 g/dL (ref 30.0–36.0)
MCV: 86.2 fL (ref 80.0–100.0)
Platelets: 264 10*3/uL (ref 150–400)
RBC: 4.21 MIL/uL — ABNORMAL LOW (ref 4.22–5.81)
RDW: 15 % (ref 11.5–15.5)
WBC: 8.5 10*3/uL (ref 4.0–10.5)
nRBC: 0 % (ref 0.0–0.2)

## 2022-05-03 LAB — APTT: aPTT: 33 seconds (ref 24–36)

## 2022-05-03 MED ORDER — SODIUM CHLORIDE 0.9% FLUSH
10.0000 mL | Freq: Once | INTRAVENOUS | Status: AC
  Administered 2022-05-03: 10 mL via INTRAVENOUS
  Filled 2022-05-03: qty 10

## 2022-05-03 MED ORDER — SODIUM CHLORIDE 0.9 % IV SOLN
Freq: Once | INTRAVENOUS | Status: AC
  Filled 2022-05-03: qty 250

## 2022-05-03 MED ORDER — HEPARIN SOD (PORK) LOCK FLUSH 100 UNIT/ML IV SOLN
500.0000 [IU] | Freq: Once | INTRAVENOUS | Status: AC
  Administered 2022-05-03: 500 [IU] via INTRAVENOUS
  Filled 2022-05-03: qty 5

## 2022-05-03 NOTE — Progress Notes (Signed)
Patient tolerated 1l IVF  infusion well, no questions/concerns voiced. Patient stable at discharge. AVS given.

## 2022-05-04 LAB — SARS CORONAVIRUS 2 (TAT 6-24 HRS): SARS Coronavirus 2: NEGATIVE

## 2022-05-04 MED ORDER — SODIUM CHLORIDE 0.9 % IV SOLN: INTRAVENOUS | Status: DC

## 2022-05-04 MED ORDER — FAMOTIDINE 20 MG PO TABS: 20.0000 mg | ORAL_TABLET | Freq: Once | ORAL | Status: AC

## 2022-05-04 MED ORDER — ORAL CARE MOUTH RINSE: 15.0000 mL | Freq: Once | OROMUCOSAL | Status: AC

## 2022-05-04 MED ORDER — CHLORHEXIDINE GLUCONATE 0.12 % MT SOLN: 15.0000 mL | Freq: Once | OROMUCOSAL | Status: AC

## 2022-05-05 ENCOUNTER — Ambulatory Visit: Payer: Medicare PPO | Admitting: Certified Registered Nurse Anesthetist

## 2022-05-05 ENCOUNTER — Encounter: Payer: Self-pay | Admitting: *Deleted

## 2022-05-05 ENCOUNTER — Other Ambulatory Visit: Payer: Self-pay

## 2022-05-05 ENCOUNTER — Ambulatory Visit
Admission: RE | Admit: 2022-05-05 | Discharge: 2022-05-05 | Disposition: A | Discharge status: Home / Self Care | Payer: Medicare PPO | Attending: Pulmonary Disease | Admitting: Pulmonary Disease

## 2022-05-05 ENCOUNTER — Ambulatory Visit: Payer: Medicare PPO

## 2022-05-05 ENCOUNTER — Encounter
Admission: RE | Disposition: A | Discharge status: Home / Self Care | Payer: Self-pay | Source: Home / Self Care | Attending: Pulmonary Disease

## 2022-05-05 DIAGNOSIS — Z85118 Personal history of other malignant neoplasm of bronchus and lung: Secondary | ICD-10-CM | POA: Insufficient documentation

## 2022-05-05 DIAGNOSIS — J449 Chronic obstructive pulmonary disease, unspecified: Secondary | ICD-10-CM | POA: Insufficient documentation

## 2022-05-05 DIAGNOSIS — F17210 Nicotine dependence, cigarettes, uncomplicated: Secondary | ICD-10-CM | POA: Insufficient documentation

## 2022-05-05 DIAGNOSIS — J850 Gangrene and necrosis of lung: Secondary | ICD-10-CM | POA: Diagnosis not present

## 2022-05-05 DIAGNOSIS — R591 Generalized enlarged lymph nodes: Secondary | ICD-10-CM | POA: Insufficient documentation

## 2022-05-05 DIAGNOSIS — I10 Essential (primary) hypertension: Secondary | ICD-10-CM | POA: Diagnosis not present

## 2022-05-05 DIAGNOSIS — Z7984 Long term (current) use of oral hypoglycemic drugs: Secondary | ICD-10-CM | POA: Insufficient documentation

## 2022-05-05 DIAGNOSIS — E119 Type 2 diabetes mellitus without complications: Secondary | ICD-10-CM | POA: Diagnosis not present

## 2022-05-05 DIAGNOSIS — R918 Other nonspecific abnormal finding of lung field: Secondary | ICD-10-CM | POA: Diagnosis present

## 2022-05-05 HISTORY — PX: BRONCHOSCOPY: SUR163

## 2022-05-05 HISTORY — PX: VIDEO BRONCHOSCOPY WITH ENDOBRONCHIAL NAVIGATION: SHX6175

## 2022-05-05 HISTORY — PX: VIDEO BRONCHOSCOPY WITH ENDOBRONCHIAL ULTRASOUND: SHX6177

## 2022-05-05 LAB — GLUCOSE, CAPILLARY
Glucose-Capillary: 123 mg/dL — ABNORMAL HIGH (ref 70–99)
Glucose-Capillary: 106 mg/dL — ABNORMAL HIGH (ref 70–99)

## 2022-05-05 SURGERY — BRONCHOSCOPY, WITH EBUS
Anesthesia: General

## 2022-05-05 MED ORDER — ONDANSETRON HCL 4 MG/2ML IJ SOLN
INTRAMUSCULAR | Status: DC | PRN
  Administered 2022-05-05: 4 mg via INTRAVENOUS

## 2022-05-05 MED ORDER — PHENYLEPHRINE HCL (PRESSORS) 10 MG/ML IV SOLN
INTRAVENOUS | Status: DC | PRN
  Administered 2022-05-05 (×5): 80 ug via INTRAVENOUS
  Administered 2022-05-05: 160 ug via INTRAVENOUS

## 2022-05-05 MED ORDER — LIDOCAINE HCL (CARDIAC) PF 100 MG/5ML IV SOSY
PREFILLED_SYRINGE | INTRAVENOUS | Status: DC | PRN
  Administered 2022-05-05: 80 mg via INTRAVENOUS

## 2022-05-05 MED ORDER — PENTAFLUOROPROP-TETRAFLUOROETH EX AERO
INHALATION_SPRAY | CUTANEOUS | Status: AC
  Filled 2022-05-05: qty 30

## 2022-05-05 MED ORDER — FENTANYL CITRATE (PF) 100 MCG/2ML IJ SOLN
INTRAMUSCULAR | Status: AC
  Filled 2022-05-05: qty 2

## 2022-05-05 MED ORDER — PROPOFOL 10 MG/ML IV BOLUS
INTRAVENOUS | Status: DC | PRN
  Administered 2022-05-05: 150 mg via INTRAVENOUS

## 2022-05-05 MED ORDER — LIDOCAINE HCL URETHRAL/MUCOSAL 2 % EX GEL
1.0000 | Freq: Once | CUTANEOUS | Status: DC
  Filled 2022-05-05: qty 5

## 2022-05-05 MED ORDER — ONDANSETRON HCL 4 MG/2ML IJ SOLN: 4.0000 mg | Freq: Once | INTRAMUSCULAR | Status: DC | PRN

## 2022-05-05 MED ORDER — SUGAMMADEX SODIUM 200 MG/2ML IV SOLN
INTRAVENOUS | Status: DC | PRN
  Administered 2022-05-05: 200 mg via INTRAVENOUS

## 2022-05-05 MED ORDER — MIDAZOLAM HCL 2 MG/2ML IJ SOLN
INTRAMUSCULAR | Status: DC | PRN
  Administered 2022-05-05 (×2): 1 mg via INTRAVENOUS

## 2022-05-05 MED ORDER — BUTAMBEN-TETRACAINE-BENZOCAINE 2-2-14 % EX AERO
1.0000 | INHALATION_SPRAY | Freq: Once | CUTANEOUS | Status: DC
  Filled 2022-05-05: qty 20

## 2022-05-05 MED ORDER — MIDAZOLAM HCL 2 MG/2ML IJ SOLN
INTRAMUSCULAR | Status: AC
  Filled 2022-05-05: qty 2

## 2022-05-05 MED ORDER — FENTANYL CITRATE (PF) 100 MCG/2ML IJ SOLN
INTRAMUSCULAR | Status: DC | PRN
  Administered 2022-05-05 (×2): 50 ug via INTRAVENOUS

## 2022-05-05 MED ORDER — PHENYLEPHRINE HCL 0.25 % NA SOLN: 1.0000 | Freq: Four times a day (QID) | NASAL | Status: DC | PRN

## 2022-05-05 MED ORDER — LIDOCAINE HCL (PF) 1 % IJ SOLN
30.0000 mL | Freq: Once | INTRAMUSCULAR | Status: DC
  Filled 2022-05-05: qty 30

## 2022-05-05 MED ORDER — FAMOTIDINE 20 MG PO TABS
ORAL_TABLET | ORAL | Status: AC
  Administered 2022-05-05: 20 mg via ORAL
  Filled 2022-05-05: qty 1

## 2022-05-05 MED ORDER — ROCURONIUM BROMIDE 100 MG/10ML IV SOLN
INTRAVENOUS | Status: DC | PRN
  Administered 2022-05-05: 50 mg via INTRAVENOUS
  Administered 2022-05-05: 10 mg via INTRAVENOUS

## 2022-05-05 MED ORDER — CHLORHEXIDINE GLUCONATE 0.12 % MT SOLN
OROMUCOSAL | Status: AC
  Administered 2022-05-05: 15 mL via OROMUCOSAL
  Filled 2022-05-05: qty 15

## 2022-05-05 MED ORDER — PROPOFOL 10 MG/ML IV BOLUS
INTRAVENOUS | Status: AC
  Filled 2022-05-05: qty 20

## 2022-05-05 MED ORDER — FENTANYL CITRATE (PF) 100 MCG/2ML IJ SOLN: 25.0000 ug | INTRAMUSCULAR | Status: DC | PRN

## 2022-05-05 NOTE — Anesthesia Preprocedure Evaluation (Signed)
Anesthesia Evaluation  Patient identified by MRN, date of birth, ID band Patient awake    Reviewed: Allergy & Precautions, NPO status , Patient's Chart, lab work & pertinent test results  Airway Mallampati: II  TM Distance: >3 FB Neck ROM: Full    Dental  (+) Edentulous Upper, Edentulous Lower   Pulmonary neg pulmonary ROS, shortness of breath and with exertion, COPD,  COPD inhaler, Current Smoker and Patient abstained from smoking.,    Pulmonary exam normal  + decreased breath sounds      Cardiovascular Exercise Tolerance: Poor hypertension, Pt. on medications negative cardio ROS Normal cardiovascular exam Rhythm:Regular     Neuro/Psych Depression negative neurological ROS  negative psych ROS   GI/Hepatic negative GI ROS, Neg liver ROS,   Endo/Other  negative endocrine ROSdiabetes, Type 2, Oral Hypoglycemic Agents  Renal/GU      Musculoskeletal  (+) Arthritis ,   Abdominal (+) + obese,   Peds negative pediatric ROS (+)  Hematology negative hematology ROS (+) Blood dyscrasia, anemia ,   Anesthesia Other Findings Past Medical History: No date: Anemia No date: Cancer (HCC) No date: COPD (chronic obstructive pulmonary disease) (HCC) No date: Degenerative arthritis of knee No date: Depression No date: Diabetes mellitus without complication (HCC) No date: Diverticulitis No date: Glaucoma     Comment:  since 2004 1979: Hernia No date: History of kidney stones No date: Hypertension No date: Neuromuscular disorder ([place]) No date: Personal history of tobacco use, presenting hazards to health No date: Pneumonia No date: Pre-diabetes No date: Screening for obesity No date: Special screening for malignant neoplasms, colon No date: Wears dentures     Comment:  full upper and lower  Past Surgical History: 1994, 2012: BACK SURGERY     Comment:  lumbar bulging disc 01/20/2021: CATARACT EXTRACTION W/PHACO; Left      Comment:  Procedure: CATARACT EXTRACTION PHACO AND INTRAOCULAR               LENS PLACEMENT ([facility]) LEFT;  Surgeon: Birder Robson,               MD;  Location: [facility];  Service:               Ophthalmology;  Laterality: Left;  10.13 0:52.1 [id number]: COLONOSCOPY     Comment:  [facility]/Dr. Buelah Manis sessile polyp,17mm, found in               rectum,multiple diverticula were found in the sigmoid, in              descending and in transverse colon 10/22/2021: COLONOSCOPY WITH PROPOFOL; N/A     Comment:  Procedure: COLONOSCOPY WITH PROPOFOL;  Surgeon: Annamaria Helling, DO;  Location: [facility];  Service:               Gastroenterology;  Laterality: N/A;  DM 04/20/2013: COLOSTOMY No date: EYE SURGERY 1979: HERNIA REPAIR     Comment:  inguinal 06/10/2021: IR IMAGING GUIDED PORT INSERTION No date: JOINT REPLACEMENT; Right     Comment:  knee 05/08/2018: KNEE ARTHROPLASTY; Right     Comment:  Procedure: COMPUTER ASSISTED TOTAL KNEE ARTHROPLASTY;                Surgeon: Dereck Leep, MD;  Location: [facility];                Service: Orthopedics;  Laterality: Right; 11/16/2019:  KNEE ARTHROPLASTY; Left     Comment:  Procedure: COMPUTER ASSISTED TOTAL KNEE ARTHROPLASTY;                Surgeon: Dereck Leep, MD;  Location: [facility];                Service: Orthopedics;  Laterality: Left; 04/20/2013: LAPAROTOMY     Comment:  colon resection with colosotmy 2013: LITHOTRIPSY 2023: LUNG BIOPSY 1962: TONSILLECTOMY AND ADENOIDECTOMY  BMI    Body Mass Index: 21.97 kg/m      Reproductive/Obstetrics negative OB ROS                             Anesthesia Physical Anesthesia Plan  ASA: 3  Anesthesia Plan: General   Post-op Pain Management:    Induction: Intravenous  PONV Risk Score and Plan: Ondansetron, Dexamethasone, Midazolam and Treatment may vary due to age or medical condition  Airway Management Planned: Oral  ETT  Additional Equipment:   Intra-op Plan:   Post-operative Plan: Extubation in OR  Informed Consent: I have reviewed the patients History and Physical, chart, labs and discussed the procedure including the risks, benefits and alternatives for the proposed anesthesia with the patient or authorized representative who has indicated his/her understanding and acceptance.     Dental Advisory Given  Plan Discussed with: CRNA and Surgeon  Anesthesia Plan Comments:         Anesthesia Quick Evaluation

## 2022-05-05 NOTE — Procedures (Signed)
ELECTROMAGNETIC NAVIGATIONAL BRONCHOSCOPY PROCEDURE NOTE  FIBEROPTIC BRONCHOSCOPY WITH BRONCHOALVEOLAR LAVAGE AND THERAPEUTIC ASPIRATION OF TRACHEOBRONCHIAL TREE PROCEDURE NOTE  ENDOBRONCHIAL ULTRASOUND PROCEDURE NOTE    Flexible bronchoscopy was performed  by : Lanney Gins MD  assistance by : 1)Repiratory therapist  and 2)[employer] staff and 3) Anesthesia team and 4) Flouroscopy team    Indication for the procedure was :  Pre-procedural H&P. The following assessment was performed on the day of the procedure prior to initiating sedation History:  Chest pain n Dyspnea y Hemoptysis n Cough y Fever n Other pertinent items n  Examination Vital signs -reviewed as per nursing documentation today Cardiac    Murmurs: n  Rubs : n  Gallop: n Lungs Wheezing: n Rales : n Rhonchi :y  Other pertinent findings: SOB/hypoxemia due to chronic lung disease   Pre-procedural assessment for Procedural Sedation included: Depth of sedation: As per anesthesia team  ASA Classification:  2 Mallampati airway assessment: 3    Medication list reviewed: y  The patient's interval history was taken and revealed: no new complaints The pre- procedure physical examination revealed: No new findings Refer to prior clinic note for details.  Informed Consent: Informed consent was obtained from:  patient after explanation of procedure and risks, benefits, as well as alternative procedures available.  Explanation of level of sedation and possible transfusion was also provided.    Procedural Preparation: Time out was performed and patient was identified by name and birthdate and procedure to be performed and side for sampling, if any, was specified. Pt was intubated by anesthesia.  The patient was appropriately draped.   Fiberoptic bronchoscopy with airway inspection and BAL Procedure findings:  Bronchoscope was inserted via ETT  without difficulty.  Posterior oropharynx, epiglottis,  arytenoids, false cords and vocal cords were not visualized as these were bypassed by endotracheal tube. The distal trachea was normal in circumference and appearance without mucosal, cartilaginous or branching abnormalities.  The main carina was mildly splayed . All right and left lobar airways were visualized to the Subsegmental level.  Sub- sub segmental carinae were identified in all the distal airways.   Secretions were visible in the following airways and appeared to be clear.  The mucosa was : friable at RUL  Airways were notable for:        exophytic lesions :n       extrinsic compression in the following distributions: n.       Friable mucosa: y       Neurosurgeon /pigmentation: n     Post procedure Diagnosis:   FRIABLE RUL WITH SEVERE MUCUS PLUGGING WHICH WAS EVACUATED VIA ASPIRATION      Electromagnetic Navigational Bronchoscopy Procedure Findings:  After appropriate CT-guided planning ENB scope was advanced via endotracheal tube and LG was advanced for registration.  Post appropriate planning and registration peripheral navigation was used to visualize target lesion.    Post procedure diagnosis: NECROTIC MASS OF RUL  -CYTOBRUSH X 3 DONE AT RIGHT UPPER LOBE   -SURGICAL BIOPSY X 18 DONE AT RIGHT UPPER LOBE   -BAL DONE AT RUL FOR CYTOLOGY AND MICROBIOLOGY      Endobronchial ultrasound assisted hilar and mediastinal lymph node biopsies procedure findings: The fiberoptic bronchoscope was removed and the EBUS scope was introduced. Examination began to evaluate for pathologically enlarged lymph nodes starting on the LEFT  side progressing to the RIGHTside.  All lymph node biopsies performed with 21G needle. Lymph node biopsies were sent in cytolite for all stations.  STATION 7 - 3 BIOPSIES - 1.8CM STATION 10R - 3 BIOPSIES - 2CM STATION 4R - 3 BIOPSIES - INDISTINCT BORDERS WITH SOROUNDING MASS  Post procedure diagnosis:  LYMPHADENOPATHY   Specimens obtained  included:                     Cytology brushes : RUL X 3  Broncho-alveolar lavage site:X2   sent for MICRO AND CYTOLOGY                              50 ml volume infused 40 ml volume returned with MUCOPURULENT  appearance  Immediate sampling complications included:NONE  Epinephrine NONE ml was used topically  The bronchoscopy was terminated due to completion of the planned procedure and the bronchoscope was removed.   Total dosage of Lidocaine was NONE mg Total fluoroscopy time was AS PER RADIOLOGY  minutes  Supplemental oxygen was provided at AS PER ANESTHESIA   Estimated Blood loss: EXPECTED <10cc.  Complications included:  NONE IMMEDIATE   Preliminary CXR findings :  IN PROCESS   Disposition: HOME WITH WIFE   Follow up with Dr. Lanney Gins in 5 days for result discussion.     Ottie Glazier MD  [facility] Division of Pulmonary & Critical Care Medicine

## 2022-05-05 NOTE — Anesthesia Procedure Notes (Signed)
Procedure Name: Intubation Date/Time: 05/05/2022 12:56 PM  Performed by: Jerrye Noble, CRNAPre-anesthesia Checklist: Patient identified, Emergency Drugs available, Suction available and Patient being monitored Patient Re-evaluated:Patient Re-evaluated prior to induction Oxygen Delivery Method: Circle system utilized Preoxygenation: Pre-oxygenation with 100% oxygen Induction Type: IV induction Ventilation: Mask ventilation without difficulty Laryngoscope Size: McGraph and 4 Grade View: Grade I Tube type: Oral Tube size: 8.5 mm Number of attempts: 1 Airway Equipment and Method: Stylet, Oral airway and Video-laryngoscopy Placement Confirmation: ETT inserted through vocal cords under direct vision, positive ETCO2 and breath sounds checked- equal and bilateral Tube secured with: Tape Dental Injury: Teeth and Oropharynx as per pre-operative assessment

## 2022-05-05 NOTE — Anesthesia Postprocedure Evaluation (Signed)
Anesthesia Post Note  Patient: Rodney Vega  Procedure(s) Performed: VIDEO BRONCHOSCOPY WITH ENDOBRONCHIAL ULTRASOUND VIDEO BRONCHOSCOPY WITH ENDOBRONCHIAL NAVIGATION  Patient location during evaluation: PACU Anesthesia Type: General Level of consciousness: awake and oriented Pain management: pain level controlled Vital Signs Assessment: post-procedure vital signs reviewed and stable Cardiovascular status: stable Anesthetic complications: no   No notable events documented.   Last Vitals:  Vitals:   05/05/22 1523 05/05/22 1538  BP:  127/77  Pulse: 80 81  Resp: 19 15  Temp:  36.7 C  SpO2: 92% 95%    Last Pain:  Vitals:   05/05/22 1538  TempSrc: Temporal  PainSc: 0-No pain                 VAN STAVEREN,Josedaniel Haye

## 2022-05-05 NOTE — Transfer of Care (Signed)
Immediate Anesthesia Transfer of Care Note  Patient: Rodney Vega  Procedure(s) Performed: VIDEO BRONCHOSCOPY WITH ENDOBRONCHIAL ULTRASOUND VIDEO BRONCHOSCOPY WITH ENDOBRONCHIAL NAVIGATION  Patient Location: PACU  Anesthesia Type:General  Level of Consciousness: awake  Airway & Oxygen Therapy: Patient Spontanous Breathing and Patient connected to face mask oxygen  Post-op Assessment: Report given to RN and Post -op Vital signs reviewed and stable  Post vital signs: Reviewed and stable  Last Vitals:  Vitals Value Taken Time  BP 142/74 05/05/22 1425  Temp 36.2 C 05/05/22 1425  Pulse 94 05/05/22 1427  Resp 19 05/05/22 1427  SpO2 100 % 05/05/22 1427  Vitals shown include unvalidated device data.  Last Pain:  Vitals:   05/05/22 1127  TempSrc: Oral  PainSc: 0-No pain         Complications: No notable events documented.

## 2022-05-05 NOTE — Discharge Instructions (Signed)
AMBULATORY SURGERY  ?DISCHARGE INSTRUCTIONS ? ? ?The drugs that you were given will stay in your system until tomorrow so for the next 24 hours you should not: ? ?Drive an automobile ?Make any legal decisions ?Drink any alcoholic beverage ? ? ?You may resume regular meals tomorrow.  Today it is better to start with liquids and gradually work up to solid foods. ? ?You may eat anything you prefer, but it is better to start with liquids, then soup and crackers, and gradually work up to solid foods. ? ? ?Please notify your doctor immediately if you have any unusual bleeding, trouble breathing, redness and pain at the surgery site, drainage, fever, or pain not relieved by medication. ? ? ? ?Additional Instructions: ? ? ? ?Please contact your physician with any problems or Same Day Surgery at [phone], Monday through Friday 6 am to 4 pm, or [facility] [place] Main number at [phone].  ?

## 2022-05-05 NOTE — H&P (Signed)
PULMONOLOGY         Date: 05/05/2022,   MRN# [id number] Rodney Vega August 03, 1956     Admission                  Current     CHIEF COMPLAINT:   Right upper lobe cavitary mass concerning for cancer recurrence and diffuse bilateral mucus plugging.    HISTORY OF PRESENT ILLNESS   This is a 66 yo M with hx of chronic anemia, lung cancer s/p core biopsy with necrotic tissue + for p40 suggestive of sq cell carcinoma.  Had seen oncology with [place] abraxane chemo initiated. Also had incidentally noted cecal mass with colonoscopy findings of dysplasia suggestive of cancer and had seen surgery but resection on hold. He has andvanced COPD has been on antibiotics recurrently with prednisone and had some improvement but not enough to allow adequate functional improvement.  He still smokes daily but attempting to quit.  Today he is here for airway inspection via bronchoscopy for therapeutic aspiration of tracheobronchial tree as well as navigational bronchoscopy and ebus to evaluate for cancer of RUL post therapy.    PAST MEDICAL HISTORY   Past Medical History:  Diagnosis Date   Anemia    Cancer ()    COPD (chronic obstructive pulmonary disease) ([place])    Degenerative arthritis of knee    Depression    Diabetes mellitus without complication ([place])    Diverticulitis    Glaucoma    since 2004   Hernia 1979   History of kidney stones    Hypertension    Neuromuscular disorder ([place])    Personal history of tobacco use, presenting hazards to health    Pneumonia    Pre-diabetes    Screening for obesity    Special screening for malignant neoplasms, colon    Wears dentures    full upper and lower     SURGICAL HISTORY   Past Surgical History:  Procedure Laterality Date   [place], 2012   lumbar bulging disc   CATARACT EXTRACTION W/PHACO Left 01/20/2021   Procedure: CATARACT EXTRACTION PHACO AND INTRAOCULAR LENS PLACEMENT ([place]) LEFT;  Surgeon: Birder Robson, MD;   Location: [facility];  Service: Ophthalmology;  Laterality: Left;  10.13 0:52.1   COLONOSCOPY  [id number]   [facility]/Dr. Buelah Manis sessile polyp,53mm, found in rectum,multiple diverticula were found in the sigmoid, in descending and in transverse colon   COLONOSCOPY WITH PROPOFOL N/A 10/22/2021   Procedure: COLONOSCOPY WITH PROPOFOL;  Surgeon: Annamaria Helling, DO;  Location: [facility] ENDOSCOPY;  Service: Gastroenterology;  Laterality: N/A;  DM   COLOSTOMY  04/20/2013   EYE SURGERY     HERNIA REPAIR  1979   inguinal   IR IMAGING GUIDED PORT INSERTION  06/10/2021   JOINT REPLACEMENT Right    knee   KNEE ARTHROPLASTY Right 05/08/2018   Procedure: COMPUTER ASSISTED TOTAL KNEE ARTHROPLASTY;  Surgeon: Dereck Leep, MD;  Location: [facility];  Service: Orthopedics;  Laterality: Right;   KNEE ARTHROPLASTY Left 11/16/2019   Procedure: COMPUTER ASSISTED TOTAL KNEE ARTHROPLASTY;  Surgeon: Dereck Leep, MD;  Location: [facility];  Service: Orthopedics;  Laterality: Left;   LAPAROTOMY  04/20/2013   colon resection with colosotmy   LITHOTRIPSY  2013   LUNG BIOPSY  2023   TONSILLECTOMY AND ADENOIDECTOMY  1962     FAMILY HISTORY   Family History  Problem Relation Age of Onset   Diabetes Mother    Heart  disease Mother    Heart attack Father    Prostate cancer Father      SOCIAL HISTORY   Social History   Tobacco Use   Smoking status: Every Day    Packs/day: 1.00    Years: 30.00    Total pack years: 30.00    Types: Cigarettes   Smokeless tobacco: Never   Tobacco comments:    since age 29 or 64  Vaping Use   Vaping Use: Never used  Substance Use Topics   Alcohol use: Not Currently    Alcohol/week: 2.0 standard drinks of alcohol    Types: 2 Standard drinks or equivalent per week    Comment: 1-2 drinks per week   Drug use: Yes    Types: Marijuana    Comment: 2-3 week     MEDICATIONS    Home Medication:    Current Medication:  Current Facility-Administered  Medications:    0.9 %  sodium chloride infusion, , Intravenous, Continuous, Martha Clan, MD   chlorhexidine (PERIDEX) 0.12 % solution 15 mL, 15 mL, Mouth/Throat, Once **OR** Oral care mouth rinse, 15 mL, Mouth Rinse, Once, Martha Clan, MD   chlorhexidine (PERIDEX) 0.12 % solution, , , ,    famotidine (PEPCID) 20 MG tablet, , , ,    famotidine (PEPCID) tablet 20 mg, 20 mg, Oral, Once, Karen Kitchens, NP  Facility-Administered Medications Ordered in Other Encounters:    heparin lock flush 100 UNIT/ML injection, , , ,    heparin lock flush 100 UNIT/ML injection, , , ,    heparin lock flush 100 UNIT/ML injection, , , ,     ALLERGIES   Paclitaxel, Ambien [zolpidem], Nsaids, and Aleve [naproxen sodium]     REVIEW OF SYSTEMS    Review of Systems:  Gen:  Denies  fever, sweats, chills weigh loss  HEENT: Denies blurred vision, double vision, ear pain, eye pain, hearing loss, nose bleeds, sore throat Cardiac:  No dizziness, chest pain or heaviness, chest tightness,edema Resp:   reports dyspnea chronically  Gi: Denies swallowing difficulty, stomach pain, nausea or vomiting, diarrhea, constipation, bowel incontinence Gu:  Denies bladder incontinence, burning urine Ext:   Denies Joint pain, stiffness or swelling Skin: Denies  skin rash, easy bruising or bleeding or hives Endoc:  Denies polyuria, polydipsia , polyphagia or weight change Psych:   Denies depression, insomnia or hallucinations   Other:  All other systems negative   VS: There were no vitals taken for this visit.     PHYSICAL EXAM    GENERAL:NAD, no fevers, chills, no weakness no fatigue HEAD: Normocephalic, atraumatic.  EYES: Pupils equal, round, reactive to light. Extraocular muscles intact. No scleral icterus.  MOUTH: Moist mucosal membrane. Dentition intact. No abscess noted.  EAR, NOSE, THROAT: Clear without exudates. No external lesions.  NECK: Supple. No thyromegaly. No nodules. No JVD.  PULMONARY:  decreased breath sounds with mild rhonchi worse at bases bilaterally.  CARDIOVASCULAR: S1 and S2. Regular rate and rhythm. No murmurs, rubs, or gallops. No edema. Pedal pulses 2+ bilaterally.  GASTROINTESTINAL: Soft, nontender, nondistended. No masses. Positive bowel sounds. No hepatosplenomegaly.  MUSCULOSKELETAL: No swelling, clubbing, or edema. Range of motion full in all extremities.  NEUROLOGIC: Cranial nerves II through XII are intact. No gross focal neurological deficits. Sensation intact. Reflexes intact.  SKIN: No ulceration, lesions, rashes, or cyanosis. Skin warm and dry. Turgor intact.  PSYCHIATRIC: Mood, affect within normal limits. The patient is awake, alert and oriented x 3. Insight, judgment intact.  IMAGING     ASSESSMENT/PLAN   Right upper lobe cavitary lung mass  -previous core biopsy with necrosis and was +p40 s/p treatment.  Not much clinical improvement.  Here today for EBUS staging and RUL lesion biopsy for cancer.   -he also has been struggling with copious amounts of inspissated mucus plugging which we plan to eval during airway inspection and hopefully remove via aspiration   -Reviewed risks/complications and benefits with patient, risks include infection, pneumothorax/pneumomediastinum which may require chest tube placement as well as overnight/prolonged hospitalization and possible mechanical ventilation. Other risks include bleeding and very rarely death.  Patient understands risks and wishes to proceed.  Additional questions were answered, and patient is aware that post procedure patient will be going home with family and may experience cough with possible clots on expectoration as well as phlegm which may last few days as well as hoarseness of voice post intubation and mechanical ventilation.          Thank you for allowing me to participate in the care of this patient.   Patient/Family are satisfied with care plan and all questions have been  answered.    Provider disclosure: Patient with at least one acute or chronic illness or injury that poses a threat to life or bodily function and is being managed actively during this encounter.  All of the below services have been performed independently by signing provider:  review of prior documentation from internal and or external health records.  Review of previous and current lab results.  Interview and comprehensive assessment during patient visit today. Review of current and previous chest radiographs/CT scans. Discussion of management and test interpretation with health care team and patient/family.   This document was prepared using Dragon voice recognition software and may include unintentional dictation errors.     Ottie Glazier, M.D.  Division of Pulmonary & Critical Care Medicine

## 2022-05-06 ENCOUNTER — Inpatient Hospital Stay (HOSPITAL_BASED_OUTPATIENT_CLINIC_OR_DEPARTMENT_OTHER): Payer: Medicare PPO | Admitting: Internal Medicine

## 2022-05-06 ENCOUNTER — Inpatient Hospital Stay: Payer: Medicare PPO

## 2022-05-06 ENCOUNTER — Encounter: Payer: Self-pay | Admitting: Internal Medicine

## 2022-05-06 ENCOUNTER — Inpatient Hospital Stay: Payer: Medicare PPO | Attending: Internal Medicine

## 2022-05-06 ENCOUNTER — Ambulatory Visit: Payer: Medicare PPO

## 2022-05-06 DIAGNOSIS — F17210 Nicotine dependence, cigarettes, uncomplicated: Secondary | ICD-10-CM | POA: Insufficient documentation

## 2022-05-06 DIAGNOSIS — Z7984 Long term (current) use of oral hypoglycemic drugs: Secondary | ICD-10-CM | POA: Insufficient documentation

## 2022-05-06 DIAGNOSIS — E1165 Type 2 diabetes mellitus with hyperglycemia: Secondary | ICD-10-CM | POA: Diagnosis not present

## 2022-05-06 DIAGNOSIS — Z95828 Presence of other vascular implants and grafts: Secondary | ICD-10-CM

## 2022-05-06 DIAGNOSIS — C3411 Malignant neoplasm of upper lobe, right bronchus or lung: Secondary | ICD-10-CM | POA: Insufficient documentation

## 2022-05-06 DIAGNOSIS — D3501 Benign neoplasm of right adrenal gland: Secondary | ICD-10-CM | POA: Diagnosis not present

## 2022-05-06 DIAGNOSIS — J439 Emphysema, unspecified: Secondary | ICD-10-CM | POA: Diagnosis not present

## 2022-05-06 DIAGNOSIS — J90 Pleural effusion, not elsewhere classified: Secondary | ICD-10-CM | POA: Insufficient documentation

## 2022-05-06 DIAGNOSIS — I10 Essential (primary) hypertension: Secondary | ICD-10-CM | POA: Diagnosis not present

## 2022-05-06 DIAGNOSIS — Z87442 Personal history of urinary calculi: Secondary | ICD-10-CM | POA: Insufficient documentation

## 2022-05-06 DIAGNOSIS — Z9221 Personal history of antineoplastic chemotherapy: Secondary | ICD-10-CM | POA: Diagnosis not present

## 2022-05-06 DIAGNOSIS — Z79899 Other long term (current) drug therapy: Secondary | ICD-10-CM | POA: Insufficient documentation

## 2022-05-06 LAB — CBC WITH DIFFERENTIAL/PLATELET
Abs Immature Granulocytes: 0.05 10*3/uL (ref 0.00–0.07)
Basophils Absolute: 0.1 10*3/uL (ref 0.0–0.1)
Basophils Relative: 1 %
Eosinophils Absolute: 0.3 10*3/uL (ref 0.0–0.5)
Eosinophils Relative: 3 %
HCT: 33.9 % — ABNORMAL LOW (ref 39.0–52.0)
Hemoglobin: 10.6 g/dL — ABNORMAL LOW (ref 13.0–17.0)
Immature Granulocytes: 1 %
Lymphocytes Relative: 7 %
Lymphs Abs: 0.7 10*3/uL (ref 0.7–4.0)
MCH: 27.6 pg (ref 26.0–34.0)
MCHC: 31.3 g/dL (ref 30.0–36.0)
MCV: 88.3 fL (ref 80.0–100.0)
Monocytes Absolute: 0.6 10*3/uL (ref 0.1–1.0)
Monocytes Relative: 6 %
Neutro Abs: 8.7 10*3/uL — ABNORMAL HIGH (ref 1.7–7.7)
Neutrophils Relative %: 82 %
Platelets: 226 10*3/uL (ref 150–400)
RBC: 3.84 MIL/uL — ABNORMAL LOW (ref 4.22–5.81)
RDW: 15.2 % (ref 11.5–15.5)
WBC: 10.5 10*3/uL (ref 4.0–10.5)
nRBC: 0 % (ref 0.0–0.2)

## 2022-05-06 LAB — CYTOLOGY - NON PAP

## 2022-05-06 LAB — COMPREHENSIVE METABOLIC PANEL
ALT: 8 U/L (ref 0–44)
AST: 20 U/L (ref 15–41)
Albumin: 3.1 g/dL — ABNORMAL LOW (ref 3.5–5.0)
Alkaline Phosphatase: 87 U/L (ref 38–126)
Anion gap: 9 (ref 5–15)
BUN: 8 mg/dL (ref 8–23)
CO2: 24 mmol/L (ref 22–32)
Calcium: 8.7 mg/dL — ABNORMAL LOW (ref 8.9–10.3)
Chloride: 101 mmol/L (ref 98–111)
Creatinine, Ser: 1.01 mg/dL (ref 0.61–1.24)
GFR, Estimated: >60 mL/min (ref >60)
Glucose, Bld: 330 mg/dL — ABNORMAL HIGH (ref 70–99)
Potassium: 3.5 mmol/L (ref 3.5–5.1)
Sodium: 134 mmol/L — ABNORMAL LOW (ref 135–145)
Total Bilirubin: 0.3 mg/dL (ref 0.3–1.2)
Total Protein: 7 g/dL (ref 6.5–8.1)

## 2022-05-06 MED ORDER — SODIUM CHLORIDE 0.9 % IV SOLN
Freq: Once | INTRAVENOUS | Status: AC
  Filled 2022-05-06: qty 250

## 2022-05-06 MED ORDER — TRIAMCINOLONE ACETONIDE 0.5 % EX OINT: 1.0000 | TOPICAL_OINTMENT | Freq: Two times a day (BID) | CUTANEOUS | 0 refills | Status: DC

## 2022-05-06 MED ORDER — SODIUM CHLORIDE 0.9% FLUSH
10.0000 mL | Freq: Once | INTRAVENOUS | Status: AC
  Administered 2022-05-06: 10 mL via INTRAVENOUS
  Filled 2022-05-06: qty 10

## 2022-05-06 MED ORDER — OXYCODONE HCL 5 MG PO TABS: 5.0000 mg | ORAL_TABLET | Freq: Three times a day (TID) | ORAL | 0 refills | Status: DC | PRN

## 2022-05-06 MED ORDER — HEPARIN SOD (PORK) LOCK FLUSH 100 UNIT/ML IV SOLN
500.0000 [IU] | Freq: Once | INTRAVENOUS | Status: DC
  Filled 2022-05-06: qty 5

## 2022-05-06 NOTE — Assessment & Plan Note (Addendum)
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  Currently on single agent Keytruda . CT scan chest JULY 27th, 2023-necrotic cavitary lesion noted right upper lobe associated with rib destruction-question related to treatment versus underlying malignancy.  May 05, 2022-s/p bronchoscopy- with Dr.Aleskerov-significant purulent/necrotic debris noted.  Currently pending biopsy cytology/cultures.  I had a long discussion with the patient and wife regarding the difficult situation.  However given the absence of any distant metastatic disease-question palliative resection of the lung.  We will make a referral to thoracic surgery .  Discussed with Dr. Lanney Gins.  # HOLD keytruda #4 today-given ongoing fatigue/overall stable imaging; also given above lung findings risk of infections etc.  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  See above  #Right chest wall pain-/pleuritic- ON  fenatny patch 25 mcg.  Oxycodone 5 mg every 8 to 12 hours hours as needed overall stable.  # COPD/fatigue- [coughing/phlegm/ no fevers]- on albuterol/advair [smoking]- TSH- WNL;Continue  Xopenex/ipratropium nebs-; Mucinex- DM BID.  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 Bary Castilla.  Continue hold surgery given ongoing lung issues.  See above  # Poorly controlled diabetes-blood sugars 113- 280;  Continue glipizide; and  Metformin. Monitor for now.   # IV mediport: functioning/STABLE  *appt thru mychart   # DISPOSITION:  # referral to thoracic surgery re: lung cancer/? Palliative resection  # HOLD Keytruda today; today- IVFs over 1 hour.   # in 1 week- IVFs-1 lit /1 hourTuesdays/Thursday   # in 2 weeks-  IVFs-1 lit /1  hourTuesdays/Thursday   # follow up in 3 weeks-Tuesdays; MD; labs- cbc/cmp'  IVFs-1 lit /1 hour- Dr.B  # I reviewed the blood work- with the patient in detail; also reviewed the imaging independently [as summarized above]; and with the patient in detail.

## 2022-05-06 NOTE — Progress Notes (Signed)
[facility] NOTE  Patient Care Team: [name], MD as PCP - General (Family Medicine) [name], RN as Oncology Nurse Navigator [name], MD as Consulting Physician (Urology) [name], MD as Consulting Physician (Oncology)  CHIEF COMPLAINTS/PURPOSE OF CONSULTATION: lung cancer    Oncology History Overview Note  AUG 2022- 8.2 x 5.9 cm spiculated mass noted in right upper lobe consistent with malignancy. It extends from the right hilum to the lateral chest wall and results in lytic destruction of the lateral portion of the right fourth rib.  Mediastinal lymph nodes are noted, including 12 mm right hilar lymph node, consistent with metastatic disease. 3 cm right adrenal mass is noted concerning for metastatic disease.  # AUG 2022- PET IMPRESSION: Peripherally hypermetabolic right upper lobe mass, likely due to central necrosis. Mass extends into the right hilum and right lateral chest wall involving the second through fourth right ribs.   Mildly enlarged and hypermetabolic right hilar lymph node.   No evidence of metastatic disease in the abdomen or pelvis.  # AUG, 2022-RIGHT CHEST WALL Bx-necrosis; suspicious for malignancy not definitive [discussed with Dr.Kraynie;MDT] LUNG MASS, RIGHT; BIOPSY:  - SUSPICIOUS FOR MALIGNANCY.  - SEE COMMENT.   Comment:  Approximately six tissue cores demonstrate inflamed fibrous capsule with  hemosiderin-laden macrophages, in a background of abundant necrosis.  One of the cores demonstrates a minute fragment of viable epithelial  cells.  These cells are positive for p40.  They are negative for TTF1  and CK7. The cells of interest disappear on deeper cut levels.  While  the findings are suspicious for squamous cell carcinoma, there is not  enough viable tumor for a definitive diagnosis.  Additional tissue  sampling may be helpful.   # SEP 2022-cycle #2 Taxol hypersensitive reaction.  Switched  over to #3 cycle- carbo-Abraxane starting 06/29/2021  # DEC 2022- s/p ID- Dr.ravishankar- ? Lung abscess-no antibiotics-  JAN 2023- CT scan incidental- cecal mass- colonoscopy[Dr.Russo; [facility]- high grade dysplasia]  S/p evaluation with Dr. [name] surgery for now]   # S/p evaluation with Dr. [name].   Primary cancer of right upper lobe of lung ([place])  06/03/2021 Initial Diagnosis   Primary cancer of right upper lobe of lung ([place])   06/03/2021 Cancer Staging   Staging form: Lung, AJCC 8th Edition - Clinical: Stage IIIB (cT3, cN2, cM0) - Signed by [name], MD on 06/03/2021   06/15/2021 - 08/03/2021 Chemotherapy   Patient is on Treatment Plan : LUNG Carboplatin / Paclitaxel + XRT q7d     10/02/2021 - 10/19/2021 Chemotherapy   Patient is on Treatment Plan : LUNG Durvalumab q14d     02/04/2022 -  Chemotherapy   Patient is on Treatment Plan : LUNG NSCLC Pembrolizumab (200) q21d        HISTORY OF PRESENTING ILLNESS: Patient is ambulating independently.  Accompanied by wife.  Gurley Climer 66 y.o.  male history of smoking -"lung cancer" [Limited necrotic tissue]-locally advanced unresectable currently on single agent Keytruda.  Patient interim had a bronchoscopy yesterday.   His breathing is improved.  However he continues to cough.  Positive for phlegm.  No fevers or chills.  He is needing oxycodone 2-3 times daily.  He continues to get IV fluids-twice a week.  Denies abdominal pain nausea vomiting.  No blood in stools black or stools.  Review of Systems  Constitutional:  Positive for malaise/fatigue and weight loss. Negative for chills, diaphoresis and fever.  HENT:  Negative for nosebleeds and sore throat.   Eyes:  Negative for double vision.  Respiratory:  Positive for cough, sputum production and shortness of breath. Negative for hemoptysis and wheezing.   Cardiovascular:  Positive for chest pain. Negative for palpitations, orthopnea and leg swelling.   Gastrointestinal:  Positive for nausea. Negative for abdominal pain, blood in stool, constipation, diarrhea, heartburn, melena and vomiting.  Genitourinary:  Negative for dysuria, frequency and urgency.  Musculoskeletal:  Negative for back pain and joint pain.  Skin: Negative.  Negative for itching and rash.  Neurological:  Positive for tingling. Negative for dizziness, focal weakness, weakness and headaches.  Endo/Heme/Allergies:  Does not bruise/bleed easily.  Psychiatric/Behavioral:  Negative for depression. The patient is not nervous/anxious and does not have insomnia.      MEDICAL HISTORY:  Past Medical History:  Diagnosis Date  . Anemia   . Cancer ([place])   . COPD (chronic obstructive pulmonary disease) ([place])   . Degenerative arthritis of knee   . Depression   . Diabetes mellitus without complication ([place])   . Diverticulitis   . Glaucoma    since 2004  . Hernia 1979  . History of kidney stones   . Hypertension   . Neuromuscular disorder ([place])   . Personal history of tobacco use, presenting hazards to health   . Pneumonia   . Pre-diabetes   . Screening for obesity   . Special screening for malignant neoplasms, colon   . Wears dentures    full upper and lower    SURGICAL HISTORY: Past Surgical History:  Procedure Laterality Date  . [place], 2012   lumbar bulging disc  . BRONCHOSCOPY  05/05/2022  . CATARACT EXTRACTION W/PHACO Left 01/20/2021   Procedure: CATARACT EXTRACTION PHACO AND INTRAOCULAR LENS PLACEMENT ([place]) LEFT;  Surgeon: Birder Robson, MD;  Location: [facility];  Service: Ophthalmology;  Laterality: Left;  10.13 0:52.1  . COLONOSCOPY  [id number]   [facility]/Dr. Buelah Manis sessile polyp,3m, found in rectum,multiple diverticula were found in the sigmoid, in descending and in transverse colon  . COLONOSCOPY WITH PROPOFOL N/A 10/22/2021   Procedure: COLONOSCOPY WITH PROPOFOL;  Surgeon: RAnnamaria Helling DO;  [facility];   Service: Gastroenterology;  Laterality: N/A;  DM  . COLOSTOMY  04/20/2013  . EYE SURGERY    . HERNIA REPAIR  1979   inguinal  . IR IMAGING GUIDED PORT INSERTION  06/10/2021  . JOINT REPLACEMENT Right    knee  . KNEE ARTHROPLASTY Right 05/08/2018   Procedure: COMPUTER ASSISTED TOTAL KNEE ARTHROPLASTY;  Surgeon: HDereck Leep MD;  Location: [facility];  Service: Orthopedics;  Laterality: Right;  . KNEE ARTHROPLASTY Left 11/16/2019   Procedure: COMPUTER ASSISTED TOTAL KNEE ARTHROPLASTY;  Surgeon: HDereck Leep MD;  Location: [facility];  Service: Orthopedics;  Laterality: Left;  . LAPAROTOMY  04/20/2013   colon resection with colosotmy  . LITHOTRIPSY  2013  . LUNG BIOPSY  2023  . TONSILLECTOMY AND ADENOIDECTOMY  1962    SOCIAL HISTORY: Social History   Socioeconomic History  . Marital status: Married    Spouse name: Pegge  . Number of children: Not on file  . Years of education: Not on file  . Highest education level: Not on file  Occupational History  . Not on file  Tobacco Use  . Smoking status: Every Day    Packs/day: 1.00    Years: 30.00    Total pack years: 30.00    Types: Cigarettes  .  Smokeless tobacco: Never  . Tobacco comments:    since age 22 or 55  Vaping Use  . Vaping Use: Never used  Substance and Sexual Activity  . Alcohol use: Not Currently    Alcohol/week: 2.0 standard drinks of alcohol    Types: 2 Standard drinks or equivalent per week    Comment: 1-2 drinks per week  . Drug use: Yes    Types: Marijuana    Comment: 2-3 week  . Sexual activity: Not on file  Other Topics Concern  . Not on file  Social History Narrative   15 mins [place]; smoking; not much alcohol. Worked for [employer], 2019. Marland Kitchen    Social Determinants of Health   Financial Resource Strain: Not on file  Food Insecurity: Not on file  Transportation Needs: Not on file  Physical Activity: Not on file  Stress: Not on file  Social Connections: Not on file   Intimate Partner Violence: Not on file    FAMILY HISTORY: Family History  Problem Relation Age of Onset  . Diabetes Mother   . Heart disease Mother   . Heart attack Father   . Prostate cancer Father     ALLERGIES:  is allergic to paclitaxel, ambien [zolpidem], nsaids, and aleve [naproxen sodium].  MEDICATIONS:  Current Outpatient Medications  Medication Sig Dispense Refill  . acetaminophen (TYLENOL) 500 MG tablet Take 2 tablets (1,000 mg total) by mouth daily as needed. Home med. 30 tablet 0  . albuterol (PROVENTIL) (2.5 MG/3ML) 0.083% nebulizer solution Take 2.5 mg by nebulization every 4 (four) hours as needed for wheezing or shortness of breath.    Marland Kitchen albuterol (VENTOLIN HFA) 108 (90 Base) MCG/ACT inhaler SMARTSIG:2 inhalation Via Inhaler Every 6 Hours PRN    . blood glucose meter kit and supplies KIT Check your blood glucose levels twice a day; once in the morning before breakfast; and once in the evening after dinner. 1 each 0  . BREZTRI AEROSPHERE 160-9-4.8 MCG/ACT AERO Inhale 2 puffs into the lungs 2 (two) times daily.    . cetirizine (ZYRTEC) 10 MG tablet Take 10 mg by mouth daily.    . chlorpheniramine-HYDROcodone 10-8 MG/5ML Take 5 mLs by mouth at bedtime as needed for cough. 140 mL 0  . dextromethorphan-guaiFENesin (MUCINEX DM) 30-600 MG 12hr tablet Take 1 tablet by mouth 2 (two) times daily as needed for cough.    . dorzolamide-timolol (COSOPT) 22.3-6.8 MG/ML ophthalmic solution Place 1 drop into both eyes 2 (two) times daily.    Marland Kitchen dronabinol (MARINOL) 5 MG capsule Take 1 capsule (5 mg total) by mouth 2 (two) times daily before lunch and supper. 60 capsule 1  . ergocalciferol (VITAMIN D2) 1.25 MG (50000 UT) capsule Take 1 capsule (50,000 Units total) by mouth once a week. 12 capsule 1  . eszopiclone (LUNESTA) 1 MG TABS tablet TAKE 1 TABLET(1 MG) BY MOUTH AT BEDTIME AS NEEDED FOR SLEEP 30 tablet 1  . fentaNYL (DURAGESIC) 25 MCG/HR Place 1 patch onto the skin every 3 (three)  days. 10 patch 0  . glipiZIDE (GLUCOTROL) 5 MG tablet TAKE 1 TABLET(5 MG) BY MOUTH DAILY BEFORE BREAKFAST 90 tablet 2  . glucose blood test strip Check your blood glucose levels twice a day; once in the morning before breakfast; and once in the evening after dinner. 100 each 12  . guaiFENesin-codeine 100-10 MG/5ML syrup Take 5 mLs by mouth daily as needed for cough. 120 mL 0  . Lancets (FREESTYLE) lancets Check your  blood glucose levels twice a day; once in the morning before breakfast; and once in the evening after dinner. 100 each 12  . latanoprost (XALATAN) 0.005 % ophthalmic solution Place 1 drop into both eyes at bedtime.    . lidocaine-prilocaine (EMLA) cream Apply 1 application. topically as needed. 30 g 3  . metFORMIN (GLUCOPHAGE) 500 MG tablet Take 1 tablet (500 mg total) by mouth 2 (two) times daily with a meal. 60 tablet 2  . montelukast (SINGULAIR) 10 MG tablet TAKE 1 TABLET(10 MG) BY MOUTH AT BEDTIME 90 tablet 0  . Pembrolizumab (KEYTRUDA IV) Inject into the vein. Once every 3 weeks, receives at Infusion center    . potassium chloride SA (KLOR-CON M) 20 MEQ tablet Take 1 tablet (20 mEq total) by mouth daily. 15 tablet 0  . propranolol ER (INDERAL LA) 60 MG 24 hr capsule Take 1 capsule (60 mg total) by mouth daily. 30 capsule 3  . Respiratory Therapy Supplies (NEBULIZER) DEVI Use as directed 1 each 0  . sulfamethoxazole-trimethoprim (BACTRIM) 400-80 MG tablet Take 1 tablet by mouth daily at 12 noon.    . tamsulosin (FLOMAX) 0.4 MG CAPS capsule Take 0.4 mg by mouth daily.    Marland Kitchen triamcinolone ointment (KENALOG) 0.5 % Apply 1 Application topically 2 (two) times daily. 30 g 0  . venlafaxine XR (EFFEXOR-XR) 75 MG 24 hr capsule Take 225 mg by mouth daily.    . vitamin B-12 (CYANOCOBALAMIN) 500 MCG tablet Take 1 tablet by mouth daily.    . feeding supplement (ENSURE ENLIVE / ENSURE PLUS) LIQD Take 237 mLs by mouth 3 (three) times daily between meals. (Patient not taking: Reported on 05/06/2022)     . Multiple Vitamin (MULTIVITAMIN WITH MINERALS) TABS tablet Take 1 tablet by mouth daily. (Patient not taking: Reported on 05/05/2022)    . oxyCODONE (OXY IR/ROXICODONE) 5 MG immediate release tablet Take 1 tablet (5 mg total) by mouth every 8 (eight) hours as needed for severe pain. 90 tablet 0   No current facility-administered medications for this visit.   Facility-Administered Medications Ordered in Other Visits  Medication Dose Route Frequency Provider Last Rate Last Admin  . heparin lock flush 100 UNIT/ML injection           . heparin lock flush 100 UNIT/ML injection           . heparin lock flush 100 UNIT/ML injection           . heparin lock flush 100 unit/mL  500 Units Intravenous Once Charlaine Dalton R, MD          .  PHYSICAL EXAMINATION: ECOG PERFORMANCE STATUS: 1 - Symptomatic but completely ambulatory  Vitals:   05/06/22 0904  BP: 123/68  Pulse: (!) 105  Temp: (!) 97.3 F (36.3 C)  SpO2: 97%     Filed Weights   05/06/22 0904  Weight: 162 lb 12.8 oz (73.8 kg)      Physical Exam Vitals and nursing note reviewed.  HENT:     Head: Normocephalic and atraumatic.     Mouth/Throat:     Pharynx: Oropharynx is clear.  Eyes:     Extraocular Movements: Extraocular movements intact.     Pupils: Pupils are equal, round, and reactive to light.  Cardiovascular:     Rate and Rhythm: Normal rate and regular rhythm.  Pulmonary:     Comments: Decreased breath sounds bilaterally.  Abdominal:     Palpations: Abdomen is soft.  Musculoskeletal:  General: Normal range of motion.     Cervical back: Normal range of motion.  Skin:    General: Skin is warm.  Neurological:     General: No focal deficit present.     Mental Status: He is alert and oriented to person, place, and time.  Psychiatric:        Behavior: Behavior normal.        Judgment: Judgment normal.     LABORATORY DATA:  I have reviewed the data as listed Lab Results  Component Value Date    WBC 10.5 05/06/2022   HGB 10.6 (L) 05/06/2022   HCT 33.9 (L) 05/06/2022   MCV 88.3 05/06/2022   PLT 226 05/06/2022   Recent Labs    03/18/22 2230 03/19/22 0610 04/08/22 0904 04/20/22 1319 05/06/22 0843  NA 139   < > 133* 139 134*  K 4.2   < > 3.4* 3.6 3.5  CL 106   < > 100 107 101  CO2 25   < > _0 GLUCOSE 114*   < > 240* 59* 330*  BUN 8   < > 7* 9 8  CREATININE 0.69   < > 0.83 0.90 1.01  CALCIUM 8.3*   < > 8.4* 8.9 8.7*  GFRNONAA >60   < > >60 >60 >60  PROT 6.3*  --  6.9  --  7.0  ALBUMIN 2.3*  --  2.9*  --  3.1*  AST 25  --  17  --  20  ALT 12  --  10  --  8  ALKPHOS 84  --  90  --  87  BILITOT 0.8  --  0.3  --  0.3   < > = values in this interval not displayed.    RADIOGRAPHIC STUDIES: I have personally reviewed the radiological images as listed and agreed with the findings in the report. DG Chest Port 1 View  Result Date: 05/05/2022 CLINICAL DATA:  Post bronchoscopy EXAM: PORTABLE CHEST 1 VIEW COMPARISON:  CT 04/29/2022.  Chest x-ray 03/18/2022. FINDINGS: Cavitary area again noted in the right upper hemithorax with surrounding extensive airspace disease. This is unchanged since prior study. Left lung clear. No effusions or pneumothorax. Left Port-A-Cath remains in place, unchanged. Heart is normal size. IMPRESSION: Stable exam.  No pneumothorax. Electronically Signed   By: Rolm Baptise M.D.   On: 05/05/2022 15:05   DG C-Arm 1-60 Min-No Report  Result Date: 05/05/2022 Fluoroscopy was utilized by the requesting physician.  No radiographic interpretation.   CT CHEST WO CONTRAST  Result Date: 04/30/2022 CLINICAL DATA:  A 66 year old male presents for follow-up of lung cancer post chemo and radiotherapy also history of colon cancer. * Tracking Code: BO * EXAM: CT CHEST WITHOUT CONTRAST TECHNIQUE: Multidetector CT imaging of the chest was performed following the standard protocol without IV contrast. RADIATION DOSE REDUCTION: This exam was performed according to the  departmental dose-optimization program which includes automated exposure control, adjustment of the mA and/or kV according to patient size and/or use of iterative reconstruction technique. COMPARISON:  April 02, 2022 FINDINGS: Cardiovascular: Calcified aortic atherosclerotic changes throughout the thoracic aorta. Aortic caliber is stable and nonaneurysmal. Heart size is normal without pericardial effusion or thickening. Three-vessel coronary artery calcifications. Central pulmonary vasculature is normal caliber. Mediastinum/Nodes: LEFT-sided Port-A-Cath terminates at the caval to atrial junction. No signs of adenopathy or acute process in visualized portions of the mediastinum. Scattered small lymph nodes throughout the chest dominant lymph node in  the subcarinal region measuring 12 mm with stable appearance. No signs of new nodal disease. Lungs/Pleura: Diffuse tree-in-bud nodularity, bronchial wall thickening and patchy ground-glass attenuation with generalized increase in the RIGHT middle lobe in particular, associated with septal thickening as well and found mainly in the RIGHT chest. 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 Slightly improved appearance of consolidative nodular changes at the RIGHT lung base (image 109/3) 2.2 x 1.6 cm as compared to 2.7 x 2.5 cm. Increased nodularity in the RIGHT middle lobe however since previous imaging and increasing nodularity along the lateral margin of the RIGHT lower lobe in the costodiaphragmatic sulcus. New pleural based area of nodularity in the RIGHT middle lobe (image 81/3) 1.8 x 1.4 cm previously some subpleural reticulation in this area with generalized increased in diffuse ill-defined nodules in the RIGHT middle lobe. Less distinct nodularity in the LEFT lower lobe with there is a spiculated appearing nodule previously now  with subsolid nodule on image 129 of series 3 measuring 10 mm previously approximately 9-10 mm with new tree-in-bud nodularity at the lateral aspect of the LEFT lower lobe showing indistinct nodules with generalized increase. New nodule in the LEFT upper lobe (image 74/3) this measures 7 mm. Background pulmonary emphysema as before. Small RIGHT-sided pleural effusion is stable. Upper Abdomen: Incidental imaging of upper abdominal contents without acute process. Imaged portions the liver, gallbladder, pancreas, spleen, adrenal glands and kidneys without change. Stable well-circumscribed low-attenuation RIGHT adrenal lesion measuring 4 Hounsfield units and 2.6 x 2.3 cm. This is compatible with a benign adrenal adenoma for which no additional dedicated imaging follow-up follow-up is recommended. Musculoskeletal: Rib erosion of ribs 3 and 4 as well as rib 5 on the RIGHT. 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 Findings are unchanged since April 02, 2022, over the short interval and are similar to imaging from March of 2023. IMPRESSION: 1. 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 Surrounding rib destruction and volume loss is similar and favored to be related to post treatment changes and inflammation difficult to exclude disease recurrence in this area but the appearance is little changed compared to prior imaging dating back to March of 2023. 2. Generalized increased nodularity particularly in the RIGHT middle lobe favored to be related to infectious bronchiolitis in the setting of bronchopleural fistula which shows persistent air-fluid levels. Waxing and waning nodules in the LEFT chest as discussed also favored to be related to this process. Given pattern of disease underlying disease recurrence would be difficult to detect but is not seen definitely  on the current study. 3. Persistent mild enlargement of subcarinal nodal tissue without change, attention on follow-up. 4. Stable small RIGHT pleural effusion. 5. Stable RIGHT adrenal adenoma. 6. Three-vessel coronary artery calcifications. Aortic Atherosclerosis (ICD10-I70.0) and Emphysema (ICD10-J43.9). Electronically Signed   By: Zetta Bills M.D.   On: 04/30/2022 18:30    ASSESSMENT & PLAN:   Primary cancer of right upper lobe of lung ([place]) # UNRESECTABLE/LOCALLY ADVANCED- Right upper lobe lung mass -concerning for malignancy [biopsy inconclusive/atypical cells]-  April 2023- PD-L1 positive [cirucologene].  Currently on single agent Keytruda . CT scan chest JULY 27th, 2023-necrotic cavitary lesion noted right upper lobe associated with rib destruction-question related to treatment versus underlying malignancy.  May 05, 2022-s/p bronchoscopy- with Dr.Aleskerov-significant purulent/necrotic debris noted.  Currently pending biopsy cytology/cultures.  I had a long discussion with the patient and wife regarding the difficult situation.  However given the absence of any distant metastatic disease-question palliative resection of the lung.  We will make a referral to thoracic surgery [place].  Discussed with Dr. Lanney Gins.  # HOLD keytruda #4 today-given ongoing fatigue/overall stable imaging; also given above lung findings risk of infections etc.  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  See above  #Right chest wall pain-/pleuritic- ON  fenatny patch 25 mcg.  Oxycodone 5 mg every 8 to 12 hours hours as needed overall stable.  # COPD/fatigue- [coughing/phlegm/ no fevers]- on albuterol/advair [smoking]- TSH- WNL;Continue  Xopenex/ipratropium nebs-; Mucinex- DM BID.  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 Bary Castilla.   Continue hold surgery given ongoing lung issues.  See above  # Poorly controlled diabetes-blood sugars 113- 280;  Continue glipizide; and  Metformin. Monitor for now.   # IV mediport: functioning/STABLE  *appt thru mychart   # DISPOSITION:  # referral to thoracic surgery re: lung cancer/? Palliative resection  # HOLD Keytruda today; today- IVFs over 1 hour.   # in 1 week- IVFs-1 lit /1 hourTuesdays/Thursday   # in 2 weeks-  IVFs-1 lit /1 hourTuesdays/Thursday   # follow up in 3 weeks-Tuesdays; MD; labs- cbc/cmp'  IVFs-1 lit /1 hour- Dr.B  # I reviewed the blood work- with the patient in detail; also reviewed the imaging independently [as summarized above]; and with the patient in detail.     All questions were answered. The patient knows to call the clinic with any problems, questions or concerns.    [name], MD 05/06/2022 3:21 PM

## 2022-05-06 NOTE — Progress Notes (Signed)
Needs a refill of oxycodone.

## 2022-05-06 NOTE — Patient Instructions (Signed)
[facility] CANCER CTR AT [place]  Discharge Instructions: Thank you for choosing [facility] to provide your oncology and hematology care.  If you have a lab appointment with the [facility], please go directly to the [facility] and check in at the registration area.  Wear comfortable clothing and clothing appropriate for easy access to any Portacath or PICC line.   We strive to give you quality time with your provider. You may need to reschedule your appointment if you arrive late (15 or more minutes).  Arriving late affects you and other patients whose appointments are after yours.  Also, if you miss three or more appointments without notifying the office, you may be dismissed from the clinic at the provider's discretion.      For prescription refill requests, have your pharmacy contact our office and allow 72 hours for refills to be completed.    Today you received the following chemotherapy and/or immunotherapy agents HYDRATION- 1 L IVF      To help prevent nausea and vomiting after your treatment, we encourage you to take your nausea medication as directed.  BELOW ARE SYMPTOMS THAT SHOULD BE REPORTED IMMEDIATELY: *FEVER GREATER THAN 100.4 F (38 C) OR HIGHER *CHILLS OR SWEATING *NAUSEA AND VOMITING THAT IS NOT CONTROLLED WITH YOUR NAUSEA MEDICATION *UNUSUAL SHORTNESS OF BREATH *UNUSUAL BRUISING OR BLEEDING *URINARY PROBLEMS (pain or burning when urinating, or frequent urination) *BOWEL PROBLEMS (unusual diarrhea, constipation, pain near the anus) TENDERNESS IN MOUTH AND THROAT WITH OR WITHOUT PRESENCE OF ULCERS (sore throat, sores in mouth, or a toothache) UNUSUAL RASH, SWELLING OR PAIN  UNUSUAL VAGINAL DISCHARGE OR ITCHING   Items with * indicate a potential emergency and should be followed up as soon as possible or go to the Emergency Department if any problems should occur.  Please show the CHEMOTHERAPY ALERT CARD or IMMUNOTHERAPY ALERT CARD at  check-in to the Emergency Department and triage nurse.  Should you have questions after your visit or need to cancel or reschedule your appointment, please contact [facility] CANCER [place] AT [place]  [phone] and follow the prompts.  Office hours are 8:00 a.m. to 4:30 p.m. Monday - Friday. Please note that voicemails left after 4:00 p.m. may not be returned until the following business day.  We are closed weekends and major holidays. You have access to a nurse at all times for urgent questions. Please call the main number to the clinic [phone] and follow the prompts.  For any non-urgent questions, you may also contact your provider using MyChart. We now offer e-Visits for anyone 61 and older to request care online for non-urgent symptoms. For details visit [email or web address].   Also download the MyChart app! Go to the app store, search "MyChart", open the app, select [facility], and log in with your MyChart username and password.  Masks are optional in the cancer centers. If you would like for your care team to wear a mask while they are taking care of you, please let them know. For doctor visits, patients may have with them one support person who is at least 67 years old. At this time, visitors are not allowed in the infusion area.  Dehydration, Adult Dehydration is a condition in which there is not enough water or other fluids in the body. This happens when a person loses more fluids than he or she takes in. Important organs, such as the kidneys, brain, and heart, cannot function without a proper amount of fluids. Any loss of  fluids from the body can lead to dehydration. Dehydration can be mild, moderate, or severe. It should be treated right away to prevent it from becoming severe. What are the causes? Dehydration may be caused by: Conditions that cause loss of water or other fluids, such as diarrhea, vomiting, or sweating or urinating a lot. Not drinking enough fluids,  especially when you are ill or doing activities that require a lot of energy. Other illnesses and conditions, such as fever or infection. Certain medicines, such as medicines that remove excess fluid from the body (diuretics). Lack of safe drinking water. Not being able to get enough water and food. What increases the risk? The following factors may make you more likely to develop this condition: Having a long-term (chronic) illness that has not been treated properly, such as diabetes, heart disease, or kidney disease. Being 20 years of age or older. Having a disability. Living in a place that is high in altitude, where thinner, drier air causes more fluid loss. Doing exercises that put stress on your body for a long time (endurance sports). What are the signs or symptoms? Symptoms of dehydration depend on how severe it is. Mild or moderate dehydration Thirst. Dry lips or dry mouth. Dizziness or light-headedness, especially when standing up from a seated position. Muscle cramps. Dark urine. Urine may be the color of tea. Less urine or tears produced than usual. Headache. Severe dehydration Changes in skin. Your skin may be cold and clammy, blotchy, or pale. Your skin also may not return to normal after being lightly pinched and released. Little or no tears, urine, or sweat. Changes in vital signs, such as rapid breathing and low blood pressure. Your pulse may be weak or may be faster than 100 beats a minute when you are sitting still. Other changes, such as: Feeling very thirsty. Sunken eyes. Cold hands and feet. Confusion. Being very tired (lethargic) or having trouble waking from sleep. Short-term weight loss. Loss of consciousness. How is this diagnosed? This condition is diagnosed based on your symptoms and a physical exam. You may have blood and urine tests to help confirm the diagnosis. How is this treated? Treatment for this condition depends on how severe it is. Treatment  should be started right away. Do not wait until dehydration becomes severe. Severe dehydration is an emergency and needs to be treated in a hospital. Mild or moderate dehydration can be treated at home. You may be asked to: Drink more fluids. Drink an oral rehydration solution (ORS). This drink helps restore proper amounts of fluids and salts and minerals in the blood (electrolytes). Severe dehydration can be treated: With IV fluids. By correcting abnormal levels of electrolytes. This is often done by giving electrolytes through a tube that is passed through your nose and into your stomach (nasogastric tube, or NG tube). By treating the underlying cause of dehydration. Follow these instructions at home: Oral rehydration solution If told by your health care provider, drink an ORS: Make an ORS by following instructions on the package. Start by drinking small amounts, about  cup (120 mL) every 5-10 minutes. Slowly increase how much you drink until you have taken the amount recommended by your health care provider. Eating and drinking        Drink enough clear fluid to keep your urine pale yellow. If you were told to drink an ORS, finish the ORS first and then start slowly drinking other clear fluids. Drink fluids such as: Water. Do not drink only water.  Doing that can lead to hyponatremia, which is having too little salt (sodium) in the body. Water from ice chips you suck on. Fruit juice that you have added water to (diluted fruit juice). Low-calorie sports drinks. Eat foods that contain a healthy balance of electrolytes, such as bananas, oranges, potatoes, tomatoes, and spinach. Do not drink alcohol. Avoid the following: Drinks that contain a lot of sugar. These include high-calorie sports drinks, fruit juice that is not diluted, and soda. Caffeine. Foods that are greasy or contain a lot of fat or sugar. General instructions Take over-the-counter and prescription medicines only as told  by your health care provider. Do not take sodium tablets. Doing that can lead to having too much sodium in the body (hypernatremia). Return to your normal activities as told by your health care provider. Ask your health care provider what activities are safe for you. Keep all follow-up visits as told by your health care provider. This is important. Contact a health care provider if: You have muscle cramps, pain, or discomfort, such as: Pain in your abdomen and the pain gets worse or stays in one area (localizes). Stiff neck. You have a rash. You are more irritable than usual. You are sleepier or have a harder time waking than usual. You feel weak or dizzy. You feel very thirsty. Get help right away if you have: Any symptoms of severe dehydration. Symptoms of vomiting, such as: You cannot eat or drink without vomiting. Vomiting gets worse or does not go away. Vomit includes blood or green matter (bile). Symptoms that get worse with treatment. A fever. A severe headache. Problems with urination or bowel movements, such as: Diarrhea that gets worse or does not go away. Blood in your stool (feces). This may cause stool to look black and tarry. Not urinating, or urinating only a small amount of very dark urine, within 6-8 hours. Trouble breathing. These symptoms may represent a serious problem that is an emergency. Do not wait to see if the symptoms will go away. Get medical help right away. Call your local emergency services (911 in the [place] Do not drive yourself to the hospital. Summary Dehydration is a condition in which there is not enough water or other fluids in the body. This happens when a person loses more fluids than he or she takes in. Treatment for this condition depends on how severe it is. Treatment should be started right away. Do not wait until dehydration becomes severe. Drink enough clear fluid to keep your urine pale yellow. If you were told to drink an oral rehydration  solution (ORS), finish the ORS first and then start slowly drinking other clear fluids. Take over-the-counter and prescription medicines only as told by your health care provider. Get help right away if you have any symptoms of severe dehydration. This information is not intended to replace advice given to you by your health care provider. Make sure you discuss any questions you have with your health care provider. Document Revised: 01/27/2022 Document Reviewed: 05/03/2019 Elsevier Patient Education  [place].

## 2022-05-07 ENCOUNTER — Encounter: Payer: Self-pay | Admitting: Pulmonary Disease

## 2022-05-07 LAB — ASPERGILLUS ANTIGEN, BAL/SERUM: Aspergillus Ag, BAL/Serum: 0.04 Index (ref 0.00–0.49)

## 2022-05-07 LAB — CYTOLOGY - NON PAP

## 2022-05-07 LAB — SURGICAL PATHOLOGY

## 2022-05-08 LAB — CULTURE, RESPIRATORY W GRAM STAIN: Culture: NO GROWTH

## 2022-05-11 ENCOUNTER — Inpatient Hospital Stay: Payer: Medicare PPO

## 2022-05-11 VITALS — BP 124/80 | HR 91 | Temp 97.5°F | Wt 159.3 lb

## 2022-05-11 DIAGNOSIS — C3411 Malignant neoplasm of upper lobe, right bronchus or lung: Secondary | ICD-10-CM

## 2022-05-11 MED ORDER — SODIUM CHLORIDE 0.9% FLUSH
10.0000 mL | Freq: Once | INTRAVENOUS | Status: AC | PRN
  Administered 2022-05-11: 10 mL
  Filled 2022-05-11: qty 10

## 2022-05-11 MED ORDER — ONDANSETRON HCL 4 MG/2ML IJ SOLN
8.0000 mg | Freq: Once | INTRAMUSCULAR | Status: AC
  Administered 2022-05-11: 8 mg via INTRAVENOUS

## 2022-05-11 MED ORDER — HEPARIN SOD (PORK) LOCK FLUSH 100 UNIT/ML IV SOLN
500.0000 [IU] | Freq: Once | INTRAVENOUS | Status: AC | PRN
  Administered 2022-05-11: 500 [IU]
  Filled 2022-05-11: qty 5

## 2022-05-11 MED ORDER — SODIUM CHLORIDE 0.9 % IV SOLN
Freq: Once | INTRAVENOUS | Status: AC
  Filled 2022-05-11: qty 250

## 2022-05-11 MED ORDER — SODIUM CHLORIDE 0.9 % IV SOLN: Freq: Once | INTRAVENOUS | Status: DC

## 2022-05-11 NOTE — Progress Notes (Signed)
Pt resting well. No dyspnea or coughing while he was getting 1 L NS. Appetite up and down. He had bronchoscopy with biopsy last week. Wife reported more dyspnea over last week. VSS. Discharged to home at completion.

## 2022-05-12 ENCOUNTER — Other Ambulatory Visit: Payer: Self-pay | Admitting: *Deleted

## 2022-05-12 MED ORDER — HYDROCOD POLI-CHLORPHE POLI ER 10-8 MG/5ML PO SUER: 5.0000 mL | Freq: Every evening | ORAL | 0 refills | Status: DC | PRN

## 2022-05-13 ENCOUNTER — Inpatient Hospital Stay: Payer: Medicare PPO

## 2022-05-13 VITALS — BP 132/83 | HR 99 | Temp 97.6°F | Wt 160.0 lb

## 2022-05-13 DIAGNOSIS — C3411 Malignant neoplasm of upper lobe, right bronchus or lung: Secondary | ICD-10-CM

## 2022-05-13 MED ORDER — HEPARIN SOD (PORK) LOCK FLUSH 100 UNIT/ML IV SOLN
500.0000 [IU] | Freq: Once | INTRAVENOUS | Status: AC | PRN
  Administered 2022-05-13: 500 [IU]
  Filled 2022-05-13: qty 5

## 2022-05-13 MED ORDER — SODIUM CHLORIDE 0.9 % IV SOLN
Freq: Once | INTRAVENOUS | Status: AC
  Filled 2022-05-13: qty 250

## 2022-05-13 MED ORDER — SODIUM CHLORIDE 0.9% FLUSH
10.0000 mL | Freq: Once | INTRAVENOUS | Status: AC | PRN
  Administered 2022-05-13: 10 mL
  Filled 2022-05-13: qty 10

## 2022-05-13 NOTE — Progress Notes (Signed)
Reports feeling weak this morning. No obvious dyspnea . Chronic pain continues in right rib cage. Received 1 L NS over 1 hour. Discharged to home via wheelchair.

## 2022-05-18 ENCOUNTER — Inpatient Hospital Stay: Payer: Medicare PPO

## 2022-05-18 VITALS — BP 113/66 | HR 92 | Temp 96.8°F | Wt 159.0 lb

## 2022-05-18 DIAGNOSIS — C3411 Malignant neoplasm of upper lobe, right bronchus or lung: Secondary | ICD-10-CM | POA: Diagnosis not present

## 2022-05-18 MED ORDER — SODIUM CHLORIDE 0.9 % IV SOLN
Freq: Once | INTRAVENOUS | Status: AC
  Filled 2022-05-18: qty 250

## 2022-05-18 MED ORDER — SODIUM CHLORIDE 0.9% FLUSH
10.0000 mL | Freq: Once | INTRAVENOUS | Status: AC | PRN
  Administered 2022-05-18: 10 mL
  Filled 2022-05-18: qty 10

## 2022-05-18 MED ORDER — HEPARIN SOD (PORK) LOCK FLUSH 100 UNIT/ML IV SOLN
500.0000 [IU] | Freq: Once | INTRAVENOUS | Status: AC | PRN
  Administered 2022-05-18: 500 [IU]
  Filled 2022-05-18: qty 5

## 2022-05-19 MED FILL — Dexamethasone Sodium Phosphate Inj 100 MG/10ML: INTRAMUSCULAR | Qty: 1 | Status: AC

## 2022-05-20 ENCOUNTER — Inpatient Hospital Stay: Payer: Medicare PPO

## 2022-05-20 VITALS — BP 128/75 | HR 85 | Temp 97.9°F | Resp 18 | Wt 159.8 lb

## 2022-05-20 DIAGNOSIS — C3411 Malignant neoplasm of upper lobe, right bronchus or lung: Secondary | ICD-10-CM | POA: Diagnosis not present

## 2022-05-20 MED ORDER — SODIUM CHLORIDE 0.9% FLUSH
10.0000 mL | Freq: Once | INTRAVENOUS | Status: AC | PRN
  Administered 2022-05-20: 10 mL
  Filled 2022-05-20: qty 10

## 2022-05-20 MED ORDER — HEPARIN SOD (PORK) LOCK FLUSH 100 UNIT/ML IV SOLN
500.0000 [IU] | Freq: Once | INTRAVENOUS | Status: AC | PRN
  Administered 2022-05-20: 500 [IU]
  Filled 2022-05-20: qty 5

## 2022-05-20 MED ORDER — SODIUM CHLORIDE 0.9 % IV SOLN
Freq: Once | INTRAVENOUS | Status: AC
  Filled 2022-05-20: qty 250

## 2022-05-24 ENCOUNTER — Other Ambulatory Visit: Payer: Self-pay | Admitting: *Deleted

## 2022-05-25 ENCOUNTER — Encounter: Payer: Self-pay | Admitting: Internal Medicine

## 2022-05-25 ENCOUNTER — Inpatient Hospital Stay: Payer: Medicare PPO

## 2022-05-25 ENCOUNTER — Other Ambulatory Visit: Payer: Self-pay

## 2022-05-25 ENCOUNTER — Inpatient Hospital Stay (HOSPITAL_BASED_OUTPATIENT_CLINIC_OR_DEPARTMENT_OTHER): Payer: Medicare PPO | Admitting: Internal Medicine

## 2022-05-25 DIAGNOSIS — C3411 Malignant neoplasm of upper lobe, right bronchus or lung: Secondary | ICD-10-CM

## 2022-05-25 DIAGNOSIS — Z95828 Presence of other vascular implants and grafts: Secondary | ICD-10-CM

## 2022-05-25 DIAGNOSIS — R110 Nausea: Secondary | ICD-10-CM

## 2022-05-25 LAB — CBC WITH DIFFERENTIAL/PLATELET
Abs Immature Granulocytes: 0.04 10*3/uL (ref 0.00–0.07)
Basophils Absolute: 0.1 10*3/uL (ref 0.0–0.1)
Basophils Relative: 1 %
Eosinophils Absolute: 0.6 10*3/uL — ABNORMAL HIGH (ref 0.0–0.5)
Eosinophils Relative: 6 %
HCT: 35.8 % — ABNORMAL LOW (ref 39.0–52.0)
Hemoglobin: 11.3 g/dL — ABNORMAL LOW (ref 13.0–17.0)
Immature Granulocytes: 0 %
Lymphocytes Relative: 11 %
Lymphs Abs: 1.1 10*3/uL (ref 0.7–4.0)
MCH: 27.6 pg (ref 26.0–34.0)
MCHC: 31.6 g/dL (ref 30.0–36.0)
MCV: 87.3 fL (ref 80.0–100.0)
Monocytes Absolute: 0.6 10*3/uL (ref 0.1–1.0)
Monocytes Relative: 6 %
Neutro Abs: 7.6 10*3/uL (ref 1.7–7.7)
Neutrophils Relative %: 76 %
Platelets: 268 10*3/uL (ref 150–400)
RBC: 4.1 MIL/uL — ABNORMAL LOW (ref 4.22–5.81)
RDW: 15.2 % (ref 11.5–15.5)
WBC: 10.1 10*3/uL (ref 4.0–10.5)
nRBC: 0 % (ref 0.0–0.2)

## 2022-05-25 LAB — COMPREHENSIVE METABOLIC PANEL
ALT: 9 U/L (ref 0–44)
AST: 20 U/L (ref 15–41)
Albumin: 3.2 g/dL — ABNORMAL LOW (ref 3.5–5.0)
Alkaline Phosphatase: 97 U/L (ref 38–126)
Anion gap: 11 (ref 5–15)
BUN: 8 mg/dL (ref 8–23)
CO2: 26 mmol/L (ref 22–32)
Calcium: 9 mg/dL (ref 8.9–10.3)
Chloride: 101 mmol/L (ref 98–111)
Creatinine, Ser: 0.99 mg/dL (ref 0.61–1.24)
GFR, Estimated: >60 mL/min (ref >60)
Glucose, Bld: 232 mg/dL — ABNORMAL HIGH (ref 70–99)
Potassium: 3.4 mmol/L — ABNORMAL LOW (ref 3.5–5.1)
Sodium: 138 mmol/L (ref 135–145)
Total Bilirubin: 0.4 mg/dL (ref 0.3–1.2)
Total Protein: 7 g/dL (ref 6.5–8.1)

## 2022-05-25 MED ORDER — HEPARIN SOD (PORK) LOCK FLUSH 100 UNIT/ML IV SOLN
500.0000 [IU] | Freq: Once | INTRAVENOUS | Status: AC
  Administered 2022-05-25: 500 [IU]
  Filled 2022-05-25: qty 5

## 2022-05-25 MED ORDER — SODIUM CHLORIDE 0.9% FLUSH
10.0000 mL | Freq: Once | INTRAVENOUS | Status: AC
  Administered 2022-05-25: 10 mL via INTRAVENOUS
  Filled 2022-05-25: qty 10

## 2022-05-25 MED ORDER — SODIUM CHLORIDE 0.9 % IV SOLN
Freq: Once | INTRAVENOUS | Status: AC
  Filled 2022-05-25: qty 250

## 2022-05-25 MED ORDER — ONDANSETRON HCL 4 MG/2ML IJ SOLN
8.0000 mg | Freq: Once | INTRAMUSCULAR | Status: AC
  Administered 2022-05-25: 8 mg via INTRAVENOUS
  Filled 2022-05-25: qty 4

## 2022-05-25 NOTE — Assessment & Plan Note (Addendum)
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  Currently on single agent Keytruda . CT scan chest JULY 27th, 2023-necrotic cavitary lesion noted right upper lobe associated with rib destruction-question related to treatment versus underlying malignancy.  May 05, 2022-s/p bronchoscopy- with Dr.Aleskerov-significant purulent/necrotic debris noted.  Currently pending biopsy cytology/cultures.  I had a long discussion with the patient and wife regarding the difficult situation.  However given the absence of any distant metastatic disease-question palliative resection of the lung.  Awaiting evaluation with thoracic surgery [facility] later this week.  Discussed with Dr. Lanney Gins.  # HOLD keytruda #4 today-given ongoing fatigue/overall stable imaging; also given above lung findings risk of infections etc. however discussed with the wife/patient that if surgery not an option-would recommend reevaluation with imaging in few months and consider restarting therapy.   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  See above  #Right chest wall pain-/pleuritic- ON  fenatny patch 25 mcg.  Oxycodone 5 mg every 8 to 12 hours hours as needed overall stable.  # COPD/fatigue- [coughing/phlegm/ no fevers]- on albuterol/advair [smoking]- TSH- WNL;Continue  Xopenex/ipratropium nebs-; Mucinex- DM BID.  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 Bary Castilla.  Continue hold surgery given ongoing lung issues.  STABLE.  # Poorly controlled diabetes-blood sugars 113- 280;  Continue glipizide; and  Metformin. Monitor for now.   # IV mediport: functioning/STABLE  *appt thru mychart   # DISPOSITION:  # today- IVFs over 1 hour.   #  8/24-  IVFs over 1 hour.  # in 1 week- IVFs-1 lit /1 hourTuesdays/Thursday   # in 2 weeks-  IVFs-1 lit /1 hourTuesdays/Thursday   # follow up in 3 weeks-Tuesdays; MD; labs- cbc/cmp'  IVFs-1 lit /1 hour- Dr.B

## 2022-05-25 NOTE — Progress Notes (Signed)
Rodney Vega       Rodney Vega,Rodney Vega [id number]             [phone]                    Rodney Vega Rodney Vega Medical Record #[id number] Date of Birth: November 27, 1955  Referring: [name], * Primary Care: [name], MD Primary Cardiologist: None  Chief Complaint:    Chief Complaint  Patient presents with   Lung Cancer    Surgical consult,PET Scan 05/25/22/ENB/EBUS 05/05/22/Chest CT 04/29/22/MR Brain 02/07/22    History of Present Illness:    Rodney Vega 66 y.o. male referred for surgical evaluation of a postradiation cavitary lesion in his right upper lobe that has been associated with a chronic productive cough.  This patient has been on antibiotics for the last 6 months.  Currently he is off, and only complains of a chronic cough which improves with nebulizers.  He denies any hemoptysis.  He denies any fevers or chills.  He does have a history of stage III right upper lobe lung cancer with invasion into his ribs and mediastinum.  He has had some weight loss during his therapy, but feels like he has been doing much better.       Zubrod Score: At the time of surgery this patient's most appropriate activity status/level should be described as: $RemoveBefor'[]'UQWcfQNIGkoB$     0    Normal activity, no symptoms $RemoveBef'[x]'cQwtcfVMiV$     1    Restricted in physical strenuous activity but ambulatory, able to do out light work $RemoveBe'[]'mnJSvSDLB$     2    Ambulatory and capable of self care, unable to do work activities, up and about               >50 % of waking hours                              '[]'$     3    Only limited self care, in bed greater than 50% of waking hours $RemoveBefo'[]'NqYrSHFZCFg$     4    Completely disabled, no self care, confined to bed or chair $Remove'[]'KKlRMIB$     5    Moribund   Past Medical History:  Diagnosis Date   Anemia    Cancer ([place])    COPD (chronic obstructive pulmonary disease) ([place])    Degenerative arthritis of knee    Depression    Diabetes mellitus without complication ([place])    Diverticulitis    Glaucoma    since  2004   Hernia 1979   History of kidney stones    Hypertension    Neuromuscular disorder ([place])    Personal history of tobacco use, presenting hazards to health    Pneumonia    Pre-diabetes    Screening for obesity    Special screening for malignant neoplasms, colon    Wears dentures    full upper and lower    Past Surgical History:  Procedure Laterality Date   [employer], 2012   lumbar bulging disc   BRONCHOSCOPY  05/05/2022   CATARACT EXTRACTION W/PHACO Left 01/20/2021   Procedure: CATARACT EXTRACTION PHACO AND INTRAOCULAR LENS PLACEMENT ([place]) LEFT;  Surgeon: Birder Robson, MD;  Location: [facility];  Service: Ophthalmology;  Laterality: Left;  10.13 0:52.1   COLONOSCOPY  [id number]   [facility]/Dr. Buelah Manis sessile polyp,95mm, found in rectum,multiple diverticula were found  in the sigmoid, in descending and in transverse colon   COLONOSCOPY WITH PROPOFOL N/A 10/22/2021   Procedure: COLONOSCOPY WITH PROPOFOL;  Surgeon: Annamaria Helling, DO;  Location: [facility];  Service: Gastroenterology;  Laterality: N/A;  DM   COLOSTOMY  04/20/2013   EYE SURGERY     HERNIA REPAIR  1979   inguinal   IR IMAGING GUIDED PORT INSERTION  06/10/2021   JOINT REPLACEMENT Right    knee   KNEE ARTHROPLASTY Right 05/08/2018   Procedure: COMPUTER ASSISTED TOTAL KNEE ARTHROPLASTY;  Surgeon: Dereck Leep, MD;  Location: [facility];  Service: Orthopedics;  Laterality: Right;   KNEE ARTHROPLASTY Left 11/16/2019   Procedure: COMPUTER ASSISTED TOTAL KNEE ARTHROPLASTY;  Surgeon: Dereck Leep, MD;  Location: [facility];  Service: Orthopedics;  Laterality: Left;   LAPAROTOMY  04/20/2013   colon resection with colosotmy   LITHOTRIPSY  2013   LUNG BIOPSY  2023   TONSILLECTOMY AND ADENOIDECTOMY  1962   VIDEO BRONCHOSCOPY WITH ENDOBRONCHIAL NAVIGATION N/A 05/05/2022   Procedure: VIDEO BRONCHOSCOPY WITH ENDOBRONCHIAL NAVIGATION;  Surgeon: Ottie Glazier, MD;  Location: [facility];  Service:  Thoracic;  Laterality: N/A;   VIDEO BRONCHOSCOPY WITH ENDOBRONCHIAL ULTRASOUND N/A 05/05/2022   Procedure: VIDEO BRONCHOSCOPY WITH ENDOBRONCHIAL ULTRASOUND;  Surgeon: Ottie Glazier, MD;  Location: [facility];  Service: Thoracic;  Laterality: N/A;    Family History  Problem Relation Age of Onset   Diabetes Mother    Heart disease Mother    Heart attack Father    Prostate cancer Father      Social History   Tobacco Use  Smoking Status Every Day   Packs/day: 1.00   Years: 30.00   Total pack years: 30.00   Types: Cigarettes  Smokeless Tobacco Never  Tobacco Comments   since age 80 or 24    Social History   Substance and Sexual Activity  Alcohol Use Not Currently   Alcohol/week: 2.0 standard drinks of alcohol   Types: 2 Standard drinks or equivalent per week   Comment: 1-2 drinks per week     Allergies  Allergen Reactions   Paclitaxel Shortness Of Breath   Ambien [Zolpidem]     Causes confusion    Nsaids     Abdominal pain   Aleve [Naproxen Sodium] Itching    Current Outpatient Medications  Medication Sig Dispense Refill   acetaminophen (TYLENOL) 500 MG tablet Take 2 tablets (1,000 mg total) by mouth daily as needed. Home med. 30 tablet 0   albuterol (PROVENTIL) (2.5 MG/3ML) 0.083% nebulizer solution Take 2.5 mg by nebulization every 4 (four) hours as needed for wheezing or shortness of breath.     albuterol (VENTOLIN HFA) 108 (90 Base) MCG/ACT inhaler SMARTSIG:2 inhalation Via Inhaler Every 6 Hours PRN     blood glucose meter kit and supplies KIT Check your blood glucose levels twice a day; once in the morning before breakfast; and once in the evening after dinner. 1 each 0   BREZTRI AEROSPHERE 160-9-4.8 MCG/ACT AERO Inhale 2 puffs into the lungs 2 (two) times daily.     cetirizine (ZYRTEC) 10 MG tablet Take 10 mg by mouth daily.     dorzolamide-timolol (COSOPT) 22.3-6.8 MG/ML ophthalmic solution Place 1 drop into both eyes 2 (two) times daily.     dronabinol  (MARINOL) 5 MG capsule Take 1 capsule (5 mg total) by mouth 2 (two) times daily before lunch and supper. 60 capsule 1   eszopiclone (LUNESTA) 1 MG TABS tablet TAKE 1  TABLET(1 MG) BY MOUTH AT BEDTIME AS NEEDED FOR SLEEP 30 tablet 1   feeding supplement (ENSURE ENLIVE / ENSURE PLUS) LIQD Take 237 mLs by mouth 3 (three) times daily between meals.     fentaNYL (DURAGESIC) 25 MCG/HR Place 1 patch onto the skin every 3 (three) days. 10 patch 0   glipiZIDE (GLUCOTROL) 5 MG tablet TAKE 1 TABLET(5 MG) BY MOUTH DAILY BEFORE BREAKFAST 90 tablet 2   glucose blood test strip Check your blood glucose levels twice a day; once in the morning before breakfast; and once in the evening after dinner. 100 each 12   guaiFENesin-codeine 100-10 MG/5ML syrup Take 5 mLs by mouth daily as needed for cough. 120 mL 0   Hydrocod Polst-Chlorphen Polst (CHLORPHENIRAMINE-HYDROCODONE) 10-8 MG/5ML Take 5 mLs by mouth at bedtime as needed for cough. 140 mL 0   Lancets (FREESTYLE) lancets Check your blood glucose levels twice a day; once in the morning before breakfast; and once in the evening after dinner. 100 each 12   latanoprost (XALATAN) 0.005 % ophthalmic solution Place 1 drop into both eyes at bedtime.     lidocaine-prilocaine (EMLA) cream Apply 1 application. topically as needed. 30 g 3   metFORMIN (GLUCOPHAGE) 500 MG tablet Take 1 tablet (500 mg total) by mouth 2 (two) times daily with a meal. 60 tablet 2   montelukast (SINGULAIR) 10 MG tablet TAKE 1 TABLET(10 MG) BY MOUTH AT BEDTIME 90 tablet 0   oxyCODONE (OXY IR/ROXICODONE) 5 MG immediate release tablet Take 1 tablet (5 mg total) by mouth every 8 (eight) hours as needed for severe pain. 90 tablet 0   propranolol ER (INDERAL LA) 60 MG 24 hr capsule Take 1 capsule (60 mg total) by mouth daily. 30 capsule 3   Respiratory Therapy Supplies (NEBULIZER) DEVI Use as directed 1 each 0   tamsulosin (FLOMAX) 0.4 MG CAPS capsule Take 0.4 mg by mouth daily.     triamcinolone ointment  (KENALOG) 0.5 % Apply 1 Application topically 2 (two) times daily. 30 g 0   venlafaxine XR (EFFEXOR-XR) 75 MG 24 hr capsule Take 225 mg by mouth daily.     vitamin B-12 (CYANOCOBALAMIN) 500 MCG tablet Take 1 tablet by mouth daily.     dextromethorphan-guaiFENesin (MUCINEX DM) 30-600 MG 12hr tablet Take 1 tablet by mouth 2 (two) times daily as needed for cough. (Patient not taking: Reported on 05/28/2022)     ergocalciferol (VITAMIN D2) 1.25 MG (50000 UT) capsule Take 1 capsule (50,000 Units total) by mouth once a week. (Patient not taking: Reported on 05/28/2022) 12 capsule 1   Multiple Vitamin (MULTIVITAMIN WITH MINERALS) TABS tablet Take 1 tablet by mouth daily. (Patient not taking: Reported on 05/05/2022)     Pembrolizumab (KEYTRUDA IV) Inject into the vein. Once every 3 weeks, receives at Infusion center (Patient not taking: Reported on 05/28/2022)     potassium chloride SA (KLOR-CON M) 20 MEQ tablet Take 1 tablet (20 mEq total) by mouth daily. (Patient not taking: Reported on 05/28/2022) 15 tablet 0   No current facility-administered medications for this visit.   Facility-Administered Medications Ordered in Other Visits  Medication Dose Route Frequency Provider Last Rate Last Admin   heparin lock flush 100 UNIT/ML injection            heparin lock flush 100 UNIT/ML injection            heparin lock flush 100 UNIT/ML injection  Review of Systems  Constitutional:  Positive for malaise/fatigue and weight loss.  Respiratory:  Positive for cough, sputum production and shortness of breath.   Cardiovascular:  Negative for chest pain.     PHYSICAL EXAMINATION: BP (!) 150/79   Pulse 92   Resp 20   Ht 6' (1.829 m)   Wt 158 lb (71.7 kg)   SpO2 98% Comment: RA  BMI 21.43 kg/m  Physical Exam Constitutional:      General: He is not in acute distress.    Appearance: He is normal weight. He is ill-appearing.  HENT:     Head: Normocephalic and atraumatic.  Cardiovascular:     Rate  and Rhythm: Normal rate.  Pulmonary:     Effort: Pulmonary effort is normal. No respiratory distress.  Musculoskeletal:        General: Normal range of motion.     Cervical back: Normal range of motion.  Skin:    General: Skin is warm and dry.  Neurological:     General: No focal deficit present.     Mental Status: He is alert and oriented to person, place, and time.     Diagnostic Studies & Laboratory data:     Recent Radiology Findings:   DG Chest Port 1 View  Result Date: 05/05/2022 CLINICAL DATA:  Post bronchoscopy EXAM: PORTABLE CHEST 1 VIEW COMPARISON:  CT 04/29/2022.  Chest x-ray 03/18/2022. FINDINGS: Cavitary area again noted in the right upper hemithorax with surrounding extensive airspace disease. This is unchanged since prior study. Left lung clear. No effusions or pneumothorax. Left Port-A-Cath remains in place, unchanged. Heart is normal size. IMPRESSION: Stable exam.  No pneumothorax. Electronically Signed   By: Rolm Baptise M.D.   On: 05/05/2022 15:05   DG C-Arm 1-60 Min-No Report  Result Date: 05/05/2022 Fluoroscopy was utilized by the requesting physician.  No radiographic interpretation.   CT CHEST WO CONTRAST  Result Date: 04/30/2022 CLINICAL DATA:  A 66 year old male presents for follow-up of lung cancer post chemo and radiotherapy also history of colon cancer. * Tracking Code: BO * EXAM: CT CHEST WITHOUT CONTRAST TECHNIQUE: Multidetector CT imaging of the chest was performed following the standard protocol without IV contrast. RADIATION DOSE REDUCTION: This exam was performed according to the departmental dose-optimization program which includes automated exposure control, adjustment of the mA and/or kV according to patient size and/or use of iterative reconstruction technique. COMPARISON:  April 02, 2022 FINDINGS: Cardiovascular: Calcified aortic atherosclerotic changes throughout the thoracic aorta. Aortic caliber is stable and nonaneurysmal. Heart size is normal  without pericardial effusion or thickening. Three-vessel coronary artery calcifications. Central pulmonary vasculature is normal caliber. Mediastinum/Nodes: LEFT-sided Port-A-Cath terminates at the caval to atrial junction. No signs of adenopathy or acute process in visualized portions of the mediastinum. Scattered small lymph nodes throughout the chest dominant lymph node in the subcarinal region measuring 12 mm with stable appearance. No signs of new nodal disease. Lungs/Pleura: Diffuse tree-in-bud nodularity, bronchial wall thickening and patchy ground-glass attenuation with generalized increase in the RIGHT middle lobe in particular, associated with septal thickening as well and found mainly in the RIGHT chest. 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 Slightly improved appearance of consolidative nodular changes at the RIGHT lung base (image 109/3) 2.2 x 1.6 cm as compared to 2.7 x 2.5 cm. Increased nodularity in the RIGHT middle lobe however since previous imaging and  increasing nodularity along the lateral margin of the RIGHT lower lobe in the costodiaphragmatic sulcus. New pleural based area of nodularity in the RIGHT middle lobe (image 81/3) 1.8 x 1.4 cm previously some subpleural reticulation in this area with generalized increased in diffuse ill-defined nodules in the RIGHT middle lobe. Less distinct nodularity in the LEFT lower lobe with there is a spiculated appearing nodule previously now with subsolid nodule on image 129 of series 3 measuring 10 mm previously approximately 9-10 mm with new tree-in-bud nodularity at the lateral aspect of the LEFT lower lobe showing indistinct nodules with generalized increase. New nodule in the LEFT upper lobe (image 74/3) this measures 7 mm. Background pulmonary emphysema as before. Small RIGHT-sided pleural effusion is  stable. Upper Abdomen: Incidental imaging of upper abdominal contents without acute process. Imaged portions the liver, gallbladder, pancreas, spleen, adrenal glands and kidneys without change. Stable well-circumscribed low-attenuation RIGHT adrenal lesion measuring 4 Hounsfield units and 2.6 x 2.3 cm. This is compatible with a benign adrenal adenoma for which no additional dedicated imaging follow-up follow-up is recommended. Musculoskeletal: Rib erosion of ribs 3 and 4 as well as rib 5 on the RIGHT. 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 Findings are unchanged since April 02, 2022, over the short interval and are similar to imaging from March of 2023. IMPRESSION: 1. 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 Surrounding rib destruction and volume loss is similar and favored to be related to post treatment changes and inflammation difficult to exclude disease recurrence in this area but the appearance is little changed compared to prior imaging dating back to March of 2023. 2. Generalized increased nodularity particularly in the RIGHT middle lobe favored to be related to infectious bronchiolitis in the setting of bronchopleural fistula which shows persistent air-fluid levels. Waxing and waning nodules in the LEFT chest as discussed also favored to be related to this process. Given pattern of disease underlying disease recurrence would be difficult to detect but is not seen definitely on the current study. 3. Persistent mild enlargement of subcarinal nodal tissue without change, attention on follow-up. 4. Stable small RIGHT pleural effusion. 5. Stable RIGHT adrenal adenoma. 6. Three-vessel coronary artery calcifications. Aortic Atherosclerosis (ICD10-I70.0) and Emphysema (ICD10-J43.9). Electronically Signed   By: Zetta Bills M.D.   On: 04/30/2022  18:30       I have independently reviewed the above radiology studies  and reviewed the findings with the patient.   Recent Lab Findings: Lab Results  Component Value Date   WBC 10.1 05/25/2022   HGB 11.3 (L) 05/25/2022   HCT 35.8 (L) 05/25/2022   PLT 268 05/25/2022   GLUCOSE 232 (H) 05/25/2022   ALT 9 05/25/2022   AST 20 05/25/2022   NA 138 05/25/2022   K 3.4 (L) 05/25/2022   CL 101 05/25/2022   CREATININE 0.99 05/25/2022   BUN 8 05/25/2022   CO2 26 05/25/2022   TSH 2.814 03/18/2022   INR 1.1 05/03/2022   HGBA1C 5.9 (H) 03/19/2022     Assessment / Plan:   65 year old male with a history of stage III lung cancer status post adjuvant chemoradiation and now has a cavitary lesion in his right upper lobe that is associated with chronic sputum production.  Symptomatically he states that he is doing much better.  He has been off of his antibiotics for the last month.  On review of his cross-sectional imaging his mediastinum is shifted towards the right and there appears to be radiation induced changes to the lung and mediastinum.  I explained to the patient that any surgical attempts to exclude this cavity would pose high, if not life-threatening risk of bleeding given all radiation changes along his great vessels.  I do not think that a lobectomy or even a pneumonectomy would be possible without significant bleeding.  There definitely are no minimally invasive options for this.  The only potential intervention would be to transpose a muscle flap into the cavity to obliterate the space.  I explained to him that this is not a procedure that I am familiar or comfortable with and have given him the option to follow-up with one of the thoracic surgeons at [facility].  The patient stated that since he is doing better he does not require this referral as of yet.  He will contact us in the future if he changes his mind.   I  spent 55 minutes with  the patient face to face in counseling and coordination  of care.    Lajuana Matte 05/28/2022 2:58 PM

## 2022-05-25 NOTE — Progress Notes (Signed)
[facility] NOTE  Patient Care Team: [name], MD as PCP - General (Family Medicine) [name], RN as Oncology Nurse Navigator [name], MD as Consulting Physician (Urology) [name], MD as Consulting Physician (Oncology)  CHIEF COMPLAINTS/PURPOSE OF CONSULTATION: lung cancer    Oncology History Overview Note  AUG 2022- 8.2 x 5.9 cm spiculated mass noted in right upper lobe consistent with malignancy. It extends from the right hilum to the lateral chest wall and results in lytic destruction of the lateral portion of the right fourth rib.  Mediastinal lymph nodes are noted, including 12 mm right hilar lymph node, consistent with metastatic disease. 3 cm right adrenal mass is noted concerning for metastatic disease.  # AUG 2022- PET IMPRESSION: Peripherally hypermetabolic right upper lobe mass, likely due to central necrosis. Mass extends into the right hilum and right lateral chest wall involving the second through fourth right ribs.   Mildly enlarged and hypermetabolic right hilar lymph node.   No evidence of metastatic disease in the abdomen or pelvis.  # AUG, 2022-RIGHT CHEST WALL Bx-necrosis; suspicious for malignancy not definitive [discussed with Dr.Kraynie;MDT] LUNG MASS, RIGHT; BIOPSY:  - SUSPICIOUS FOR MALIGNANCY.  - SEE COMMENT.   Comment:  Approximately six tissue cores demonstrate inflamed fibrous capsule with  hemosiderin-laden macrophages, in a background of abundant necrosis.  One of the cores demonstrates a minute fragment of viable epithelial  cells.  These cells are positive for p40.  They are negative for TTF1  and CK7. The cells of interest disappear on deeper cut levels.  While  the findings are suspicious for squamous cell carcinoma, there is not  enough viable tumor for a definitive diagnosis.  Additional tissue  sampling may be helpful.   # SEP 2022-cycle #2 Taxol hypersensitive reaction.  Switched  over to #3 cycle- carbo-Abraxane starting 06/29/2021  # DEC 2022- s/p ID- Dr.ravishankar- ? Lung abscess-no antibiotics-  JAN 2023- CT scan incidental- cecal mass- colonoscopy[Dr.Russo; [facility]- high grade dysplasia]  S/p evaluation with Dr. [name] surgery for now]   # S/p evaluation with Dr. [name].   Primary cancer of right upper lobe of lung ([place])  06/03/2021 Initial Diagnosis   Primary cancer of right upper lobe of lung ([place])   06/03/2021 Cancer Staging   Staging form: Lung, AJCC 8th Edition - Clinical: Stage IIIB (cT3, cN2, cM0) - Signed by [name], MD on 06/03/2021   06/15/2021 - 08/03/2021 Chemotherapy   Patient is on Treatment Plan : LUNG Carboplatin / Paclitaxel + XRT q7d     10/02/2021 - 10/19/2021 Chemotherapy   Patient is on Treatment Plan : LUNG Durvalumab q14d     02/04/2022 -  Chemotherapy   Patient is on Treatment Plan : LUNG NSCLC Pembrolizumab (200) q21d       HISTORY OF PRESENTING ILLNESS: Patient is ambulating independently.  Accompanied by wife.  Calvyn Kurtzman 66 y.o.  male history of smoking -"lung cancer" [Limited necrotic tissue]-locally advanced unresectable currently on single agent Keytruda.  Keytruda currently on hold given his necrotic mass in the right lung.  Patient is currently awaiting evaluation with thoracic surgery in [place] this week.  Patient continues to have spells of fatigue/and short of breath and cough.  However no fever no chills. He is needing oxycodone 2-3 times daily.  He continues to get IV fluids-twice a week.  Denies abdominal pain nausea vomiting.  No blood in stools black or stools.  Review of Systems  Constitutional:  Positive for malaise/fatigue and weight loss. Negative for chills, diaphoresis and fever.  HENT:  Negative for nosebleeds and sore throat.   Eyes:  Negative for double vision.  Respiratory:  Positive for cough, sputum production and shortness of breath. Negative for hemoptysis and wheezing.    Cardiovascular:  Positive for chest pain. Negative for palpitations, orthopnea and leg swelling.  Gastrointestinal:  Positive for nausea. Negative for abdominal pain, blood in stool, constipation, diarrhea, heartburn, melena and vomiting.  Genitourinary:  Negative for dysuria, frequency and urgency.  Musculoskeletal:  Negative for back pain and joint pain.  Skin: Negative.  Negative for itching and rash.  Neurological:  Positive for tingling. Negative for dizziness, focal weakness, weakness and headaches.  Endo/Heme/Allergies:  Does not bruise/bleed easily.  Psychiatric/Behavioral:  Negative for depression. The patient is not nervous/anxious and does not have insomnia.      MEDICAL HISTORY:  Past Medical History:  Diagnosis Date  . Anemia   . Cancer (HCC)   . COPD (chronic obstructive pulmonary disease) (HCC)   . Degenerative arthritis of knee   . Depression   . Diabetes mellitus without complication (HCC)   . Diverticulitis   . Glaucoma    since 2004  . Hernia 1979  . History of kidney stones   . Hypertension   . Neuromuscular disorder (HCC)   . Personal history of tobacco use, presenting hazards to health   . Pneumonia   . Pre-diabetes   . Screening for obesity   . Special screening for malignant neoplasms, colon   . Wears dentures    full upper and lower    SURGICAL HISTORY: Past Surgical History:  Procedure Laterality Date  . BACK SURGERY  1994, 2012   lumbar bulging disc  . BRONCHOSCOPY  05/05/2022  . CATARACT EXTRACTION W/PHACO Left 01/20/2021   Procedure: CATARACT EXTRACTION PHACO AND INTRAOCULAR LENS PLACEMENT (IOC) LEFT;  Surgeon: Galen Manila, MD;  Location: [facility];  Service: Ophthalmology;  Laterality: Left;  10.13 0:52.1  . COLONOSCOPY  [id number]   [facility]/Dr. Carmell Austria sessile polyp,60mm, found in rectum,multiple diverticula were found in the sigmoid, in descending and in transverse colon  . COLONOSCOPY WITH PROPOFOL N/A 10/22/2021    Procedure: COLONOSCOPY WITH PROPOFOL;  Surgeon: Jaynie Collins, DO;  Location: [facility];  Service: Gastroenterology;  Laterality: N/A;  DM  . COLOSTOMY  04/20/2013  . EYE SURGERY    . HERNIA REPAIR  1979   inguinal  . IR IMAGING GUIDED PORT INSERTION  06/10/2021  . JOINT REPLACEMENT Right    knee  . KNEE ARTHROPLASTY Right 05/08/2018   Procedure: COMPUTER ASSISTED TOTAL KNEE ARTHROPLASTY;  Surgeon: Donato Heinz, MD;  Location: [facility] OR[facility];  Service: Orthopedics;  Laterality: Right;  . KNEE ARTHROPLASTY Left 11/16/2019   Procedure: COMPUTER ASSISTED TOTAL KNEE ARTHROPLASTY;  Surgeon: Donato Heinz, MD;  Location: [facility] OR[facility];  Service: Orthopedics;  Laterality: Left;  . LAPAROTOMY  04/20/2013   colon resection with colosotmy  . LITHOTRIPSY  2013  . LUNG BIOPSY  2023  . TONSILLECTOMY AND ADENOIDECTOMY  1962  . VIDEO BRONCHOSCOPY WITH ENDOBRONCHIAL NAVIGATION N/A 05/05/2022   Procedure: VIDEO BRONCHOSCOPY WITH ENDOBRONCHIAL NAVIGATION;  Surgeon: [name], MD;  Location: [facility] ORS;  Service: Thoracic;  Laterality: N/A;  . VIDEO BRONCHOSCOPY WITH ENDOBRONCHIAL ULTRASOUND N/A 05/05/2022   Procedure: VIDEO BRONCHOSCOPY WITH ENDOBRONCHIAL ULTRASOUND;  Surgeon: [name], MD;  Location: [facility] ORS;  Service: Thoracic;  Laterality: N/A;    SOCIAL HISTORY: Social History  Socioeconomic History  . Marital status: Married    Spouse name: Pegge  . Number of children: Not on file  . Years of education: Not on file  . Highest education level: Not on file  Occupational History  . Not on file  Tobacco Use  . Smoking status: Every Day    Packs/day: 1.00    Years: 30.00    Total pack years: 30.00    Types: Cigarettes  . Smokeless tobacco: Never  . Tobacco comments:    since age 73 or 50  Vaping Use  . Vaping Use: Never used  Substance and Sexual Activity  . Alcohol use: Not Currently    Alcohol/week: 2.0 standard drinks of alcohol    Types: 2 Standard drinks or  equivalent per week    Comment: 1-2 drinks per week  . Drug use: Yes    Types: Marijuana    Comment: 2-3 week  . Sexual activity: Not on file  Other Topics Concern  . Not on file  Social History Narrative   15 mins [place]; smoking; not much alcohol. Worked for [employer], 2019. 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 Diabetes Mother   . Heart disease Mother   . Heart attack Father   . Prostate cancer Father     ALLERGIES:  is allergic to paclitaxel, ambien [zolpidem], nsaids, and aleve [naproxen sodium].  MEDICATIONS:  Current Outpatient Medications  Medication Sig Dispense Refill  . acetaminophen (TYLENOL) 500 MG tablet Take 2 tablets (1,000 mg total) by mouth daily as needed. Home med. 30 tablet 0  . albuterol (PROVENTIL) (2.5 MG/3ML) 0.083% nebulizer solution Take 2.5 mg by nebulization every 4 (four) hours as needed for wheezing or shortness of breath.    [place] Kitchen albuterol (VENTOLIN HFA) 108 (90 Base) MCG/ACT inhaler SMARTSIG:2 inhalation Via Inhaler Every 6 Hours PRN    . blood glucose meter kit and supplies KIT Check your blood glucose levels twice a day; once in the morning before breakfast; and once in the evening after dinner. 1 each 0  . BREZTRI AEROSPHERE 160-9-4.8 MCG/ACT AERO Inhale 2 puffs into the lungs 2 (two) times daily.    . cetirizine (ZYRTEC) 10 MG tablet Take 10 mg by mouth daily.    Mar[place] Kitchen dextromethorphan-guaiFENesin (MUCINEX DM) 30-600 MG 12hr tablet Take 1 tablet by mouth 2 (two) times daily as needed for cough.    . dorzolamide-timolol (COSOPT) 22.3-6.8 MG/ML ophthalmic solution Place 1 drop into both eyes 2 (two) times daily.    Mar[place] Kitchen dronabinol  (MARINOL) 5 MG capsule Take 1 capsule (5 mg total) by mouth 2 (two) times daily before lunch and supper. 60 capsule 1  . ergocalciferol (VITAMIN D2) 1.25 MG (50000 UT) capsule Take 1 capsule (50,000 Units total) by mouth once a week. 12 capsule 1  . eszopiclone (LUNESTA) 1 MG TABS tablet TAKE 1 TABLET(1 MG) BY MOUTH AT BEDTIME AS NEEDED FOR SLEEP 30 tablet 1  . feeding supplement (ENSURE ENLIVE / ENSURE PLUS) LIQD Take 237 mLs by mouth 3 (three) times daily between meals. (Patient not taking: Reported on 05/06/2022)    . fentaNYL (DURAGESIC) 25 MCG/HR  Place 1 patch onto the skin every 3 (three) days. 10 patch 0  . glipiZIDE (GLUCOTROL) 5 MG tablet TAKE 1 TABLET(5 MG) BY MOUTH DAILY BEFORE BREAKFAST 90 tablet 2  . glucose blood test strip Check your blood glucose levels twice a day; once in the morning before breakfast; and once in the evening after dinner. 100 each 12  . guaiFENesin-codeine 100-10 MG/5ML syrup Take 5 mLs by mouth daily as needed for cough. 120 mL 0  . Hydrocod Polst-Chlorphen Polst (CHLORPHENIRAMINE-HYDROCODONE) 10-8 MG/5ML Take 5 mLs by mouth at bedtime as needed for cough. 140 mL 0  . Lancets (FREESTYLE) lancets Check your blood glucose levels twice a day; once in the morning before breakfast; and once in the evening after dinner. 100 each 12  . latanoprost (XALATAN) 0.005 % ophthalmic solution Place 1 drop into both eyes at bedtime.    . lidocaine-prilocaine (EMLA) cream Apply 1 application. topically as needed. 30 g 3  . metFORMIN (GLUCOPHAGE) 500 MG tablet Take 1 tablet (500 mg total) by mouth 2 (two) times daily with a meal. 60 tablet 2  . montelukast (SINGULAIR) 10 MG tablet TAKE 1 TABLET(10 MG) BY MOUTH AT BEDTIME 90 tablet 0  . Multiple Vitamin (MULTIVITAMIN WITH MINERALS) TABS tablet Take 1 tablet by mouth daily. (Patient not taking: Reported on 05/05/2022)    . oxyCODONE (OXY IR/ROXICODONE) 5 MG immediate release tablet Take 1 tablet (5 mg total) by mouth every 8 (eight) hours  as needed for severe pain. 90 tablet 0  . Pembrolizumab (KEYTRUDA IV) Inject into the vein. Once every 3 weeks, receives at Infusion center    . potassium chloride SA (KLOR-CON M) 20 MEQ tablet Take 1 tablet (20 mEq total) by mouth daily. 15 tablet 0  . propranolol ER (INDERAL LA) 60 MG 24 hr capsule Take 1 capsule (60 mg total) by mouth daily. 30 capsule 3  . Respiratory Therapy Supplies (NEBULIZER) DEVI Use as directed 1 each 0  . tamsulosin (FLOMAX) 0.4 MG CAPS capsule Take 0.4 mg by mouth daily.    Mar[place] Kitchen triamcinolone ointment (KENALOG) 0.5 % Apply 1 Application topically 2 (two) times daily. 30 g 0  . venlafaxine XR (EFFEXOR-XR) 75 MG 24 hr capsule Take 225 mg by mouth daily.    . vitamin B-12 (CYANOCOBALAMIN) 500 MCG tablet Take 1 tablet by mouth daily.     No current facility-administered medications for this visit.   Facility-Administered Medications Ordered in Other Visits  Medication Dose Route Frequency Provider Last Rate Last Admin  . heparin lock flush 100 UNIT/ML injection           . heparin lock flush 100 UNIT/ML injection           . heparin lock flush 100 UNIT/ML injection               .  PHYSICAL EXAMINATION: ECOG PERFORMANCE STATUS: 1 - Symptomatic but completely ambulatory  Vitals:   05/25/22 0918  BP: 114/68  Pulse: 98  Resp: 18  Temp: (!) 97.1 F (36.2 C)  SpO2: 94%     Filed Weights   05/25/22 0918  Weight: 158 lb 14.4 oz (72.1 kg)      Physical Exam Vitals and nursing note reviewed.  HENT:     Head: Normocephalic and atraumatic.     Mouth/Throat:     Pharynx: Oropharynx is clear.  Eyes:     Extraocular Movements: Extraocular movements intact.     Pupils: Pupils are equal, round,  and reactive to light.  Cardiovascular:     Rate and Rhythm: Normal rate and regular rhythm.  Pulmonary:     Comments: Decreased breath sounds bilaterally.  Abdominal:     Palpations: Abdomen is soft.  Musculoskeletal:        General: Normal range of motion.      Cervical back: Normal range of motion.  Skin:    General: Skin is warm.  Neurological:     General: No focal deficit present.     Mental Status: He is alert and oriented to person, place, and time.  Psychiatric:        Behavior: Behavior normal.        Judgment: Judgment normal.     LABORATORY DATA:  I have reviewed the data as listed Lab Results  Component Value Date   WBC 10.1 05/25/2022   HGB 11.3 (L) 05/25/2022   HCT 35.8 (L) 05/25/2022   MCV 87.3 05/25/2022   PLT 268 05/25/2022   Recent Labs    04/08/22 0904 04/20/22 1319 05/06/22 0843 05/25/22 0930  NA 133* 139 134* 138  K 3.4* 3.6 3.5 3.4*  CL 100 107 101 101  CO2 $Re'25 26 24 26  'xqg$ GLUCOSE 240* 59* 330* 232*  BUN 7* $Remo'9 8 8  'psCQp$ CREATININE 0.83 0.90 1.01 0.99  CALCIUM 8.4* 8.9 8.7* 9.0  GFRNONAA >60 >60 >60 >60  PROT 6.9  --  7.0 7.0  ALBUMIN 2.9*  --  3.1* 3.2*  AST 17  --  20 20  ALT 10  --  8 9  ALKPHOS 90  --  87 97  BILITOT 0.3  --  0.3 0.4    RADIOGRAPHIC STUDIES: I have personally reviewed the radiological images as listed and agreed with the findings in the report. DG Chest Port 1 View  Result Date: 05/05/2022 CLINICAL DATA:  Post bronchoscopy EXAM: PORTABLE CHEST 1 VIEW COMPARISON:  CT 04/29/2022.  Chest x-ray 03/18/2022. FINDINGS: Cavitary area again noted in the right upper hemithorax with surrounding extensive airspace disease. This is unchanged since prior study. Left lung clear. No effusions or pneumothorax. Left Port-A-Cath remains in place, unchanged. Heart is normal size. IMPRESSION: Stable exam.  No pneumothorax. Electronically Signed   By: Rolm Baptise M.D.   On: 05/05/2022 15:05   DG C-Arm 1-60 Min-No Report  Result Date: 05/05/2022 Fluoroscopy was utilized by the requesting physician.  No radiographic interpretation.   CT CHEST WO CONTRAST  Result Date: 04/30/2022 CLINICAL DATA:  A 66 year old male presents for follow-up of lung cancer post chemo and radiotherapy also history of colon cancer. *  Tracking Code: BO * EXAM: CT CHEST WITHOUT CONTRAST TECHNIQUE: Multidetector CT imaging of the chest was performed following the standard protocol without IV contrast. RADIATION DOSE REDUCTION: This exam was performed according to the departmental dose-optimization program which includes automated exposure control, adjustment of the mA and/or kV according to patient size and/or use of iterative reconstruction technique. COMPARISON:  April 02, 2022 FINDINGS: Cardiovascular: Calcified aortic atherosclerotic changes throughout the thoracic aorta. Aortic caliber is stable and nonaneurysmal. Heart size is normal without pericardial effusion or thickening. Three-vessel coronary artery calcifications. Central pulmonary vasculature is normal caliber. Mediastinum/Nodes: LEFT-sided Port-A-Cath terminates at the caval to atrial junction. No signs of adenopathy or acute process in visualized portions of the mediastinum. Scattered small lymph nodes throughout the chest dominant lymph node in the subcarinal region measuring 12 mm with stable appearance. No signs of new nodal disease. Lungs/Pleura: Diffuse tree-in-bud nodularity,  bronchial wall thickening and patchy ground-glass attenuation with generalized increase in the RIGHT middle lobe in particular, associated with septal thickening as well and found mainly in the RIGHT chest. 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 Slightly improved appearance of consolidative nodular changes at the RIGHT lung base (image 109/3) 2.2 x 1.6 cm as compared to 2.7 x 2.5 cm. Increased nodularity in the RIGHT middle lobe however since previous imaging and increasing nodularity along the lateral margin of the RIGHT lower lobe in the costodiaphragmatic sulcus. New pleural based area of nodularity in the RIGHT middle lobe (image 81/3) 1.8 x 1.4 cm  previously some subpleural reticulation in this area with generalized increased in diffuse ill-defined nodules in the RIGHT middle lobe. Less distinct nodularity in the LEFT lower lobe with there is a spiculated appearing nodule previously now with subsolid nodule on image 129 of series 3 measuring 10 mm previously approximately 9-10 mm with new tree-in-bud nodularity at the lateral aspect of the LEFT lower lobe showing indistinct nodules with generalized increase. New nodule in the LEFT upper lobe (image 74/3) this measures 7 mm. Background pulmonary emphysema as before. Small RIGHT-sided pleural effusion is stable. Upper Abdomen: Incidental imaging of upper abdominal contents without acute process. Imaged portions the liver, gallbladder, pancreas, spleen, adrenal glands and kidneys without change. Stable well-circumscribed low-attenuation RIGHT adrenal lesion measuring 4 Hounsfield units and 2.6 x 2.3 cm. This is compatible with a benign adrenal adenoma for which no additional dedicated imaging follow-up follow-up is recommended. Musculoskeletal: Rib erosion of ribs 3 and 4 as well as rib 5 on the RIGHT. 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 Findings are unchanged since April 02, 2022, over the short interval and are similar to imaging from March of 2023. IMPRESSION: 1. 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 Surrounding rib destruction and volume loss is similar and favored to be related to post treatment changes and inflammation difficult to exclude disease recurrence in this area but the appearance is little changed compared to prior imaging dating back to March of 2023. 2. Generalized increased nodularity particularly in the RIGHT middle lobe favored to be related to infectious bronchiolitis in the setting of bronchopleural fistula which  shows persistent air-fluid levels. Waxing and waning nodules in the LEFT chest as discussed also favored to be related to this process. Given pattern of disease underlying disease recurrence would be difficult to detect but is not seen definitely on the current study. 3. Persistent mild enlargement of subcarinal nodal tissue without change, attention on follow-up. 4. Stable small RIGHT pleural effusion. 5. Stable RIGHT adrenal adenoma. 6. Three-vessel coronary artery calcifications. Aortic Atherosclerosis (ICD10-I70.0) and Emphysema (ICD10-J43.9). Electronically Signed   By: Zetta Bills M.D.   On: 04/30/2022 18:30    ASSESSMENT & PLAN:   Primary cancer of right upper lobe of lung ([place]) # UNRESECTABLE/LOCALLY ADVANCED- Right upper lobe lung mass -concerning for malignancy [biopsy inconclusive/atypical cells]-  April 2023- PD-L1 positive [cirucologene].  Currently on single agent Keytruda . CT scan chest JULY 27th, 2023-necrotic cavitary lesion noted right upper lobe associated with rib destruction-question related to treatment versus underlying malignancy.  May 05, 2022-s/p bronchoscopy- with Dr.Aleskerov-significant purulent/necrotic debris noted.  Currently pending biopsy cytology/cultures.  I had a long discussion with  the patient and wife regarding the difficult situation.  However given the absence of any distant metastatic disease-question palliative resection of the lung.  Awaiting evaluation with thoracic surgery [place] later this week.  Discussed with Dr. Lanney Gins.  # HOLD keytruda #4 today-given ongoing fatigue/overall stable imaging; also given above lung findings risk of infections etc. however discussed with the wife/patient that if surgery not an option-would recommend reevaluation with imaging in few months and consider restarting therapy.   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  See  above  #Right chest wall pain-/pleuritic- ON  fenatny patch 25 mcg.  Oxycodone 5 mg every 8 to 12 hours hours as needed overall stable.  # COPD/fatigue- [coughing/phlegm/ no fevers]- on albuterol/advair [smoking]- TSH- WNL;Continue  Xopenex/ipratropium nebs-; Mucinex- DM BID.  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 Bary Castilla.  Continue hold surgery given ongoing lung issues.  STABLE.  # Poorly controlled diabetes-blood sugars 113- 280;  Continue glipizide; and  Metformin. Monitor for now.   # IV mediport: functioning/STABLE  *appt thru mychart   # DISPOSITION:  # today- IVFs over 1 hour.   # 8/24-  IVFs over 1 hour.  # in 1 week- IVFs-1 lit /1 hourTuesdays/Thursday   # in 2 weeks-  IVFs-1 lit /1 hourTuesdays/Thursday   # follow up in 3 weeks-Tuesdays; MD; labs- cbc/cmp'  IVFs-1 lit /1 hour- Dr.B     All questions were answered. The patient knows to call the clinic with any problems, questions or concerns.    [name], MD 05/25/2022 1:43 PM

## 2022-05-27 ENCOUNTER — Inpatient Hospital Stay: Payer: Medicare PPO

## 2022-05-27 VITALS — BP 123/71 | HR 96 | Temp 97.8°F | Wt 159.8 lb

## 2022-05-27 DIAGNOSIS — C3411 Malignant neoplasm of upper lobe, right bronchus or lung: Secondary | ICD-10-CM | POA: Diagnosis not present

## 2022-05-27 MED ORDER — SODIUM CHLORIDE 0.9 % IV SOLN
Freq: Once | INTRAVENOUS | Status: AC
  Filled 2022-05-27: qty 250

## 2022-05-27 MED ORDER — SODIUM CHLORIDE 0.9% FLUSH
10.0000 mL | Freq: Once | INTRAVENOUS | Status: AC | PRN
  Administered 2022-05-27: 10 mL
  Filled 2022-05-27: qty 10

## 2022-05-27 MED ORDER — HEPARIN SOD (PORK) LOCK FLUSH 100 UNIT/ML IV SOLN
500.0000 [IU] | Freq: Once | INTRAVENOUS | Status: AC | PRN
  Administered 2022-05-27: 500 [IU]
  Filled 2022-05-27: qty 5

## 2022-05-27 NOTE — Progress Notes (Signed)
Pt thinks he may be eating a little more at times. Eats light, cool foods. Tolerates sandwiches and deli meats. His taste is still altered. Received 1 L NS. No dyspnea noted in clinic today.

## 2022-05-28 ENCOUNTER — Institutional Professional Consult (permissible substitution): Payer: Medicare PPO | Admitting: Thoracic Surgery (Cardiothoracic Vascular Surgery)

## 2022-05-28 VITALS — BP 150/79 | HR 92 | Resp 20 | Ht 72.0 in | Wt 158.0 lb

## 2022-05-28 DIAGNOSIS — C3411 Malignant neoplasm of upper lobe, right bronchus or lung: Secondary | ICD-10-CM | POA: Diagnosis not present

## 2022-05-29 ENCOUNTER — Other Ambulatory Visit: Payer: Self-pay | Admitting: Internal Medicine

## 2022-05-29 DIAGNOSIS — E1369 Other specified diabetes mellitus with other specified complication: Secondary | ICD-10-CM

## 2022-05-31 ENCOUNTER — Encounter: Payer: Self-pay | Admitting: Internal Medicine

## 2022-06-01 ENCOUNTER — Inpatient Hospital Stay: Payer: Medicare PPO

## 2022-06-01 ENCOUNTER — Other Ambulatory Visit: Payer: Self-pay | Admitting: *Deleted

## 2022-06-01 VITALS — BP 117/67 | HR 92 | Temp 97.0°F | Resp 18 | Wt 161.5 lb

## 2022-06-01 DIAGNOSIS — C3411 Malignant neoplasm of upper lobe, right bronchus or lung: Secondary | ICD-10-CM | POA: Diagnosis not present

## 2022-06-01 MED ORDER — HEPARIN SOD (PORK) LOCK FLUSH 100 UNIT/ML IV SOLN
500.0000 [IU] | Freq: Once | INTRAVENOUS | Status: AC | PRN
  Administered 2022-06-01: 500 [IU]
  Filled 2022-06-01: qty 5

## 2022-06-01 MED ORDER — SODIUM CHLORIDE 0.9% FLUSH
10.0000 mL | Freq: Once | INTRAVENOUS | Status: AC | PRN
  Administered 2022-06-01: 10 mL
  Filled 2022-06-01: qty 10

## 2022-06-01 MED ORDER — SODIUM CHLORIDE 0.9 % IV SOLN
Freq: Once | INTRAVENOUS | Status: AC
  Filled 2022-06-01: qty 250

## 2022-06-01 MED ORDER — OXYCODONE HCL 5 MG PO TABS: 5.0000 mg | ORAL_TABLET | Freq: Three times a day (TID) | ORAL | 0 refills | Status: DC | PRN

## 2022-06-03 ENCOUNTER — Inpatient Hospital Stay: Payer: Medicare PPO

## 2022-06-03 ENCOUNTER — Encounter: Payer: Self-pay | Admitting: *Deleted

## 2022-06-03 ENCOUNTER — Other Ambulatory Visit: Payer: Self-pay | Admitting: *Deleted

## 2022-06-03 VITALS — BP 127/77 | HR 95 | Temp 98.2°F | Resp 16 | Ht 72.0 in | Wt 161.6 lb

## 2022-06-03 DIAGNOSIS — C3411 Malignant neoplasm of upper lobe, right bronchus or lung: Secondary | ICD-10-CM | POA: Diagnosis not present

## 2022-06-03 MED ORDER — ESZOPICLONE 1 MG PO TABS: ORAL_TABLET | ORAL | 1 refills | Status: DC

## 2022-06-03 MED ORDER — SODIUM CHLORIDE 0.9 % IV SOLN
Freq: Once | INTRAVENOUS | Status: AC
  Filled 2022-06-03: qty 250

## 2022-06-03 MED ORDER — HEPARIN SOD (PORK) LOCK FLUSH 100 UNIT/ML IV SOLN
500.0000 [IU] | Freq: Once | INTRAVENOUS | Status: AC | PRN
  Administered 2022-06-03: 500 [IU]
  Filled 2022-06-03: qty 5

## 2022-06-03 MED ORDER — SODIUM CHLORIDE 0.9% FLUSH
10.0000 mL | Freq: Once | INTRAVENOUS | Status: AC | PRN
  Administered 2022-06-03: 10 mL
  Filled 2022-06-03: qty 10

## 2022-06-07 LAB — FUNGUS CULTURE WITH STAIN

## 2022-06-07 LAB — FUNGUS CULTURE RESULT

## 2022-06-07 LAB — FUNGAL ORGANISM REFLEX

## 2022-06-08 ENCOUNTER — Inpatient Hospital Stay: Payer: Medicare PPO | Attending: Internal Medicine

## 2022-06-08 VITALS — BP 122/72 | HR 84 | Temp 98.4°F

## 2022-06-08 DIAGNOSIS — J449 Chronic obstructive pulmonary disease, unspecified: Secondary | ICD-10-CM | POA: Insufficient documentation

## 2022-06-08 DIAGNOSIS — Z9221 Personal history of antineoplastic chemotherapy: Secondary | ICD-10-CM | POA: Diagnosis not present

## 2022-06-08 DIAGNOSIS — F17210 Nicotine dependence, cigarettes, uncomplicated: Secondary | ICD-10-CM | POA: Insufficient documentation

## 2022-06-08 DIAGNOSIS — R0789 Other chest pain: Secondary | ICD-10-CM | POA: Insufficient documentation

## 2022-06-08 DIAGNOSIS — Z87442 Personal history of urinary calculi: Secondary | ICD-10-CM | POA: Diagnosis not present

## 2022-06-08 DIAGNOSIS — I10 Essential (primary) hypertension: Secondary | ICD-10-CM | POA: Diagnosis not present

## 2022-06-08 DIAGNOSIS — Z79899 Other long term (current) drug therapy: Secondary | ICD-10-CM | POA: Diagnosis not present

## 2022-06-08 DIAGNOSIS — E119 Type 2 diabetes mellitus without complications: Secondary | ICD-10-CM | POA: Insufficient documentation

## 2022-06-08 DIAGNOSIS — C3411 Malignant neoplasm of upper lobe, right bronchus or lung: Secondary | ICD-10-CM | POA: Insufficient documentation

## 2022-06-08 DIAGNOSIS — E1165 Type 2 diabetes mellitus with hyperglycemia: Secondary | ICD-10-CM | POA: Insufficient documentation

## 2022-06-08 DIAGNOSIS — Z7984 Long term (current) use of oral hypoglycemic drugs: Secondary | ICD-10-CM | POA: Diagnosis not present

## 2022-06-08 MED ORDER — SODIUM CHLORIDE 0.9% FLUSH
10.0000 mL | Freq: Once | INTRAVENOUS | Status: AC | PRN
  Administered 2022-06-08: 10 mL
  Filled 2022-06-08: qty 10

## 2022-06-08 MED ORDER — SODIUM CHLORIDE 0.9 % IV SOLN
Freq: Once | INTRAVENOUS | Status: AC
  Filled 2022-06-08: qty 250

## 2022-06-08 MED ORDER — HEPARIN SOD (PORK) LOCK FLUSH 100 UNIT/ML IV SOLN
500.0000 [IU] | Freq: Once | INTRAVENOUS | Status: AC | PRN
  Administered 2022-06-08: 500 [IU]
  Filled 2022-06-08: qty 5

## 2022-06-09 MED FILL — Dexamethasone Sodium Phosphate Inj 100 MG/10ML: INTRAMUSCULAR | Qty: 1 | Status: AC

## 2022-06-10 ENCOUNTER — Inpatient Hospital Stay: Payer: Medicare PPO

## 2022-06-10 DIAGNOSIS — C3411 Malignant neoplasm of upper lobe, right bronchus or lung: Secondary | ICD-10-CM

## 2022-06-10 MED ORDER — HEPARIN SOD (PORK) LOCK FLUSH 100 UNIT/ML IV SOLN
500.0000 [IU] | Freq: Once | INTRAVENOUS | Status: AC | PRN
  Administered 2022-06-10: 500 [IU]
  Filled 2022-06-10: qty 5

## 2022-06-10 MED ORDER — SODIUM CHLORIDE 0.9 % IV SOLN
Freq: Once | INTRAVENOUS | Status: AC
  Filled 2022-06-10: qty 250

## 2022-06-10 MED ORDER — SODIUM CHLORIDE 0.9% FLUSH
10.0000 mL | Freq: Once | INTRAVENOUS | Status: AC | PRN
  Administered 2022-06-10: 10 mL
  Filled 2022-06-10: qty 10

## 2022-06-15 ENCOUNTER — Inpatient Hospital Stay: Payer: Medicare PPO

## 2022-06-15 ENCOUNTER — Encounter: Payer: Self-pay | Admitting: Internal Medicine

## 2022-06-15 ENCOUNTER — Inpatient Hospital Stay (HOSPITAL_BASED_OUTPATIENT_CLINIC_OR_DEPARTMENT_OTHER): Payer: Medicare PPO | Admitting: Internal Medicine

## 2022-06-15 VITALS — BP 125/76 | HR 87 | Temp 97.5°F | Wt 159.0 lb

## 2022-06-15 DIAGNOSIS — Z95828 Presence of other vascular implants and grafts: Secondary | ICD-10-CM

## 2022-06-15 DIAGNOSIS — C3411 Malignant neoplasm of upper lobe, right bronchus or lung: Secondary | ICD-10-CM | POA: Diagnosis not present

## 2022-06-15 LAB — COMPREHENSIVE METABOLIC PANEL
ALT: 8 U/L (ref 0–44)
AST: 15 U/L (ref 15–41)
Albumin: 3.2 g/dL — ABNORMAL LOW (ref 3.5–5.0)
Alkaline Phosphatase: 89 U/L (ref 38–126)
Anion gap: 5 (ref 5–15)
BUN: 7 mg/dL — ABNORMAL LOW (ref 8–23)
CO2: 29 mmol/L (ref 22–32)
Calcium: 8.5 mg/dL — ABNORMAL LOW (ref 8.9–10.3)
Chloride: 105 mmol/L (ref 98–111)
Creatinine, Ser: 0.87 mg/dL (ref 0.61–1.24)
GFR, Estimated: >60 mL/min (ref >60)
Glucose, Bld: 93 mg/dL (ref 70–99)
Potassium: 3.6 mmol/L (ref 3.5–5.1)
Sodium: 139 mmol/L (ref 135–145)
Total Bilirubin: 0.4 mg/dL (ref 0.3–1.2)
Total Protein: 6.7 g/dL (ref 6.5–8.1)

## 2022-06-15 LAB — CBC WITH DIFFERENTIAL/PLATELET
Abs Immature Granulocytes: 0.04 10*3/uL (ref 0.00–0.07)
Basophils Absolute: 0.1 10*3/uL (ref 0.0–0.1)
Basophils Relative: 1 %
Eosinophils Absolute: 0.4 10*3/uL (ref 0.0–0.5)
Eosinophils Relative: 5 %
HCT: 33.7 % — ABNORMAL LOW (ref 39.0–52.0)
Hemoglobin: 10.9 g/dL — ABNORMAL LOW (ref 13.0–17.0)
Immature Granulocytes: 1 %
Lymphocytes Relative: 15 %
Lymphs Abs: 1.3 10*3/uL (ref 0.7–4.0)
MCH: 28 pg (ref 26.0–34.0)
MCHC: 32.3 g/dL (ref 30.0–36.0)
MCV: 86.6 fL (ref 80.0–100.0)
Monocytes Absolute: 0.8 10*3/uL (ref 0.1–1.0)
Monocytes Relative: 10 %
Neutro Abs: 5.8 10*3/uL (ref 1.7–7.7)
Neutrophils Relative %: 68 %
Platelets: 215 10*3/uL (ref 150–400)
RBC: 3.89 MIL/uL — ABNORMAL LOW (ref 4.22–5.81)
RDW: 15.9 % — ABNORMAL HIGH (ref 11.5–15.5)
WBC: 8.4 10*3/uL (ref 4.0–10.5)
nRBC: 0 % (ref 0.0–0.2)

## 2022-06-15 MED ORDER — SODIUM CHLORIDE 0.9% FLUSH
10.0000 mL | Freq: Once | INTRAVENOUS | Status: AC
  Administered 2022-06-15: 10 mL via INTRAVENOUS
  Filled 2022-06-15: qty 10

## 2022-06-15 MED ORDER — HEPARIN SOD (PORK) LOCK FLUSH 100 UNIT/ML IV SOLN
500.0000 [IU] | Freq: Once | INTRAVENOUS | Status: AC
  Administered 2022-06-15: 500 [IU]
  Filled 2022-06-15: qty 5

## 2022-06-15 MED ORDER — SODIUM CHLORIDE 0.9 % IV SOLN
Freq: Once | INTRAVENOUS | Status: AC
  Filled 2022-06-15: qty 250

## 2022-06-15 NOTE — Progress Notes (Signed)
Occasional nausea that is relieved with Compazine.  No appetite with 2 lb wt loss and does not drink Ensure any more.

## 2022-06-15 NOTE — Assessment & Plan Note (Addendum)
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  CT scan chest JULY 27th, 2023-necrotic cavitary lesion noted right upper lobe associated with rib destruction-question related to treatment versus underlying malignancy.  May 05, 2022-s/p bronchoscopy- with Dr.Aleskerov-significant purulent/necrotic debris noted.    # S/p Evaluation with thoracic surgery [place], Dr.Lightfoot-not a candidate for surgery given need for sternotomy/and potential complications.  Patient declines any further evaluation at a higher center like [facility].   #Continue to [place] #4 today-given ongoing fatigue/overall stable imaging; also given above lung findings risk of infections etc. given the difficult situation I would recommend reevaluation with imaging in few months and consider restarting therapy.  Repeat imaging again in 1 month or so.   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  See above  #Right chest wall pain-/pleuritic- ON  fenatny patch 25 mcg.  Oxycodone 5 mg every 8 to 12 hours hours as needed overall- STABLE.   # COPD/fatigue- [coughing/phlegm/ no fevers]- on albuterol/advair [smoking]- TSH- WNL;Continue  Xopenex/ipratropium nebs-; Mucinex- DM BID.  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 Bary Castilla.  Continue hold surgery given ongoing lung issues.  STABLE.  # Poorly controlled diabetes-blood sugars 113- 280;  Continue glipizide; and  Metformin. STABLE.  # IV mediport: functioning/STABLE  *appt thru mychart   # DISPOSITION:  # today- IVFs over 1 hour.   # 9/14-  IVFs over 1 hour.  # in 1 week- IVFs-1 lit /1 hourTuesdays/Thursday   # in 2 weeks-  IVFs-1 lit /1 hourTuesdays/Thursday   # in 3  weeks-  IVFs-1 lit /1 hourTuesdays/Thursday   # follow up in 4 weeks-Tuesdays; MD; labs- cbc/cmp'  IVFs-1 lit /1 hour; CT CAP prior-- Dr.B

## 2022-06-15 NOTE — Progress Notes (Signed)
[name] NOTE  Patient Care Team: [name], MD as PCP - General (Family Medicine) [name], RN as Oncology Nurse Navigator [name], MD as Consulting Physician (Urology) [name], MD as Consulting Physician (Oncology)  CHIEF COMPLAINTS/PURPOSE OF CONSULTATION: lung cancer    Oncology History Overview Note  AUG 2022- 8.2 x 5.9 cm spiculated mass noted in right upper lobe consistent with malignancy. It extends from the right hilum to the lateral chest wall and results in lytic destruction of the lateral portion of the right fourth rib.  Mediastinal lymph nodes are noted, including 12 mm right hilar lymph node, consistent with metastatic disease. 3 cm right adrenal mass is noted concerning for metastatic disease.  # AUG 2022- PET IMPRESSION: Peripherally hypermetabolic right upper lobe mass, likely due to central necrosis. Mass extends into the right hilum and right lateral chest wall involving the second through fourth right ribs.   Mildly enlarged and hypermetabolic right hilar lymph node.   No evidence of metastatic disease in the abdomen or pelvis.  # AUG, 2022-RIGHT CHEST WALL Bx-necrosis; suspicious for malignancy not definitive [discussed with Dr.Kraynie;MDT] LUNG MASS, RIGHT; BIOPSY:  - SUSPICIOUS FOR MALIGNANCY.  - SEE COMMENT.   Comment:  Approximately six tissue cores demonstrate inflamed fibrous capsule with  hemosiderin-laden macrophages, in a background of abundant necrosis.  One of the cores demonstrates a minute fragment of viable epithelial  cells.  These cells are positive for p40.  They are negative for TTF1  and CK7. The cells of interest disappear on deeper cut levels.  While  the findings are suspicious for squamous cell carcinoma, there is not  enough viable tumor for a definitive diagnosis.  Additional tissue  sampling may be helpful.   # SEP 2022-cycle #2 Taxol hypersensitive reaction.  Switched  over to #3 cycle- carbo-Abraxane starting 06/29/2021  # DEC 2022- s/p ID- Dr.ravishankar- ? Lung abscess-no antibiotics-  JAN 2023- CT scan incidental- cecal mass- colonoscopy[Dr.Russo; [facility]- high grade dysplasia]  S/p evaluation with Dr. [name] surgery for now]   # S/p evaluation with Dr. [name].   Primary cancer of right upper lobe of lung ([place])  06/03/2021 Initial Diagnosis   Primary cancer of right upper lobe of lung ([place])   06/03/2021 Cancer Staging   Staging form: Lung, AJCC 8th Edition - Clinical: Stage IIIB (cT3, cN2, cM0) - Signed by [name], MD on 06/03/2021   06/15/2021 - 08/03/2021 Chemotherapy   Patient is on Treatment Plan : LUNG Carboplatin / Paclitaxel + XRT q7d     10/02/2021 - 10/19/2021 Chemotherapy   Patient is on Treatment Plan : LUNG Durvalumab q14d     02/04/2022 -  Chemotherapy   Patient is on Treatment Plan : LUNG NSCLC Pembrolizumab (200) q21d       HISTORY OF PRESENTING ILLNESS: Patient is ambulating independently.  Accompanied by wife.  Rodney Vega 66 y.o.  male history of smoking -"lung cancer" [Limited necrotic tissue]-locally advanced unresectable currently on single agent Keytruda.  Keytruda currently on hold given his necrotic mass in the right lung.  Patient currently s/p evaluation with Dr.Ligthfoot thoracic surgery in [place]. ..  Patient continues to have spells of fatigue/and short of breath and cough.  However no fever no chills. He is needing oxycodone 2-3 times daily.  He continues to get IV fluids-twice a week.  Denies abdominal pain nausea vomiting.  No blood in stools black or stools.  Review of Systems  Constitutional:  Positive for malaise/fatigue and weight loss. Negative for chills, diaphoresis and fever.  HENT:  Negative for nosebleeds and sore throat.   Eyes:  Negative for double vision.  Respiratory:  Positive for cough, sputum production and shortness of breath. Negative for hemoptysis and wheezing.    Cardiovascular:  Positive for chest pain. Negative for palpitations, orthopnea and leg swelling.  Gastrointestinal:  Positive for nausea. Negative for abdominal pain, blood in stool, constipation, diarrhea, heartburn, melena and vomiting.  Genitourinary:  Negative for dysuria, frequency and urgency.  Musculoskeletal:  Negative for back pain and joint pain.  Skin: Negative.  Negative for itching and rash.  Neurological:  Positive for tingling. Negative for dizziness, focal weakness, weakness and headaches.  Endo/Heme/Allergies:  Does not bruise/bleed easily.  Psychiatric/Behavioral:  Negative for depression. The patient is not nervous/anxious and does not have insomnia.      MEDICAL HISTORY:  Past Medical History:  Diagnosis Date   Anemia    Cancer (HCC)    COPD (chronic obstructive pulmonary disease) (HCC)    Degenerative arthritis of knee    Depression    Diabetes mellitus without complication (HCC)    Diverticulitis    Glaucoma    since 2004   Hernia 1979   History of kidney stones    Hypertension    Neuromuscular disorder (HCC)    Personal history of tobacco use, presenting hazards to health    Pneumonia    Pre-diabetes    Screening for obesity    Special screening for malignant neoplasms, colon    Wears dentures    full upper and lower    SURGICAL HISTORY: Past Surgical History:  Procedure Laterality Date   BACK SURGERY  1994, 2012   lumbar bulging disc   BRONCHOSCOPY  05/05/2022   CATARACT EXTRACTION W/PHACO Left 01/20/2021   Procedure: CATARACT EXTRACTION PHACO AND INTRAOCULAR LENS PLACEMENT (IOC) LEFT;  Surgeon: Galen Manila, MD;  Location: [facility];  Service: Ophthalmology;  Laterality: Left;  10.13 0:52.1   COLONOSCOPY  0277,[id number]   [facility]/Dr. Carmell Austria sessile polyp,50mm, found in rectum,multiple diverticula were found in the sigmoid, in descending and in transverse colon   COLONOSCOPY WITH PROPOFOL N/A 10/22/2021   Procedure: COLONOSCOPY WITH  PROPOFOL;  Surgeon: Jaynie Collins, DO;  Location: [facility];  Service: Gastroenterology;  Laterality: N/A;  DM   COLOSTOMY  04/20/2013   EYE SURGERY     HERNIA REPAIR  1979   inguinal   IR IMAGING GUIDED PORT INSERTION  06/10/2021   JOINT REPLACEMENT Right    knee   KNEE ARTHROPLASTY Right 05/08/2018   Procedure: COMPUTER ASSISTED TOTAL KNEE ARTHROPLASTY;  Surgeon: Donato Heinz, MD;  Location: [facility];  Service: Orthopedics;  Laterality: Right;   KNEE ARTHROPLASTY Left 11/16/2019   Procedure: COMPUTER ASSISTED TOTAL KNEE ARTHROPLASTY;  Surgeon: Donato Heinz, MD;  Location: [facility];  Service: Orthopedics;  Laterality: Left;   LAPAROTOMY  04/20/2013   colon resection with colosotmy   LITHOTRIPSY  2013   LUNG BIOPSY  2023   TONSILLECTOMY AND ADENOIDECTOMY  1962   VIDEO BRONCHOSCOPY WITH ENDOBRONCHIAL NAVIGATION N/A 05/05/2022   Procedure: VIDEO BRONCHOSCOPY WITH ENDOBRONCHIAL NAVIGATION;  Surgeon: [name], MD;  Location: [facility];  Service: Thoracic;  Laterality: N/A;   VIDEO BRONCHOSCOPY WITH ENDOBRONCHIAL ULTRASOUND N/A 05/05/2022   Procedure: VIDEO BRONCHOSCOPY WITH ENDOBRONCHIAL ULTRASOUND;  Surgeon: [name], MD;  Location: [facility];  Service: Thoracic;  Laterality: N/A;    SOCIAL HISTORY: Social History  Socioeconomic History   Marital status: Married    Spouse name: Pegge   Number of children: Not on file   Years of education: Not on file   Highest education level: Not on file  Occupational History   Not on file  Tobacco Use   Smoking status: Every Day    Packs/day: 1.00    Years: 30.00    Total pack years: 30.00    Types: Cigarettes   Smokeless tobacco: Never   Tobacco comments:    since age 7 or 70  Vaping Use   Vaping Use: Never used  Substance and Sexual Activity   Alcohol use: Not Currently    Alcohol/week: 2.0 standard drinks of alcohol    Types: 2 Standard drinks or equivalent per week    Comment: 1-2 drinks per week    Drug use: Yes    Types: Marijuana    Comment: 2-3 week   Sexual activity: Not on file  Other Topics Concern   Not on file  Social History Narrative   15 mins [place]; smoking; not much alcohol. 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  MEDICATIONS:  Current Outpatient Medications  Medication Sig Dispense Refill   Accu-Chek Softclix Lancets lancets USE AS DIRECTED FOUR TIMES DAILY 100 each 12   acetaminophen (TYLENOL) 500 MG tablet Take 2 tablets (1,000 mg total) by mouth daily as needed. Home med. 30 tablet 0   albuterol (PROVENTIL) (2.5 MG/3ML) 0.083% nebulizer solution Take 2.5 mg by nebulization every 4 (four) hours as needed for wheezing or shortness of breath.     albuterol (VENTOLIN HFA) 108 (90 Base) MCG/ACT inhaler SMARTSIG:2 inhalation Via Inhaler Every 6 Hours PRN     blood glucose meter kit and supplies KIT Check your blood glucose levels twice a day; once in the morning before breakfast; and once in the evening after dinner. 1 each 0   BREZTRI AEROSPHERE 160-9-4.8 MCG/ACT AERO Inhale 2 puffs into the lungs 2 (two) times daily.     cetirizine (ZYRTEC) 10 MG tablet Take 10 mg by mouth daily.     dextromethorphan-guaiFENesin (MUCINEX DM) 30-600 MG 12hr tablet Take 1 tablet by mouth 2 (two) times daily as needed for cough. (Patient not taking: Reported on 05/28/2022)     dorzolamide-timolol (COSOPT) 22.3-6.8 MG/ML ophthalmic solution Place 1 drop  into both eyes 2 (two) times daily.     dronabinol (MARINOL) 5 MG capsule Take 1 capsule (5 mg total) by mouth 2 (two) times daily before lunch and supper. 60 capsule 1   ergocalciferol (VITAMIN D2) 1.25 MG (50000 UT) capsule Take 1 capsule (50,000 Units total) by mouth once a week. (Patient not taking: Reported on 05/28/2022) 12 capsule 1   eszopiclone (LUNESTA) 1 MG TABS tablet TAKE 1 TABLET(1 MG) BY MOUTH AT BEDTIME AS NEEDED FOR SLEEP 30 tablet 1   feeding supplement (ENSURE ENLIVE / ENSURE  PLUS) LIQD Take 237 mLs by mouth 3 (three) times daily between meals.     fentaNYL (DURAGESIC) 25 MCG/HR Place 1 patch onto the skin every 3 (three) days. 10 patch 0   glipiZIDE (GLUCOTROL) 5 MG tablet TAKE 1 TABLET(5 MG) BY MOUTH DAILY BEFORE BREAKFAST 90 tablet 2   glucose blood test strip Check your blood glucose levels twice a day; once in the morning before breakfast; and once in the evening after dinner. 100 each 12   guaiFENesin-codeine 100-10 MG/5ML syrup Take 5 mLs by mouth daily as needed for cough. 120 mL 0   Hydrocod Polst-Chlorphen Polst (CHLORPHENIRAMINE-HYDROCODONE) 10-8 MG/5ML Take 5 mLs by mouth at bedtime as needed for cough. 140 mL 0   latanoprost (XALATAN) 0.005 % ophthalmic solution Place 1 drop into both eyes at bedtime.     lidocaine-prilocaine (EMLA) cream Apply 1 application. topically as needed. 30 g 3   metFORMIN (GLUCOPHAGE) 500 MG tablet Take 1 tablet (500 mg total) by mouth 2 (two) times daily with a meal. 60 tablet 2   montelukast (SINGULAIR) 10 MG tablet TAKE 1 TABLET(10 MG) BY MOUTH AT BEDTIME 90 tablet 0   Multiple Vitamin (MULTIVITAMIN WITH MINERALS) TABS tablet Take 1 tablet by mouth daily. (Patient not taking: Reported on 05/05/2022)     oxyCODONE (OXY IR/ROXICODONE) 5 MG immediate release tablet Take 1 tablet (5 mg total) by mouth every 8 (eight) hours as needed for severe pain. 90 tablet 0   Pembrolizumab (KEYTRUDA IV) Inject into the vein. Once every 3 weeks, receives at  Infusion center (Patient not taking: Reported on 05/28/2022)     potassium chloride SA (KLOR-CON M) 20 MEQ tablet Take 1 tablet (20 mEq total) by mouth daily. (Patient not taking: Reported on 05/28/2022) 15 tablet 0   propranolol ER (INDERAL LA) 60 MG 24 hr capsule Take 1 capsule (60 mg total) by mouth daily. 30 capsule 3   Respiratory Therapy Supplies (NEBULIZER) DEVI Use as directed 1 each 0   tamsulosin (FLOMAX) 0.4 MG CAPS capsule Take 0.4 mg by mouth daily.     triamcinolone ointment (KENALOG) 0.5 % Apply 1 Application topically 2 (two) times daily. 30 g 0   venlafaxine XR (EFFEXOR-XR) 75 MG 24 hr capsule Take 225 mg by mouth daily.     vitamin B-12 (CYANOCOBALAMIN) 500 MCG tablet Take 1 tablet by mouth daily.     No current facility-administered medications for this visit.   Facility-Administered Medications Ordered in Other Visits  Medication Dose Route Frequency Provider Last Rate Last Admin   heparin lock flush 100 UNIT/ML injection            heparin lock flush 100 UNIT/ML injection            heparin lock flush 100 UNIT/ML injection               .  PHYSICAL EXAMINATION: ECOG PERFORMANCE STATUS: 1 - Symptomatic but completely ambulatory  There were no vitals filed for this visit.    There were no vitals filed for this visit.     Physical Exam Vitals and nursing note reviewed.  HENT:     Head: Normocephalic and atraumatic.     Mouth/Throat:     Pharynx: Oropharynx is clear.  Eyes:     Extraocular Movements: Extraocular movements intact.     Pupils: Pupils are equal, round, and reactive to light.  Cardiovascular:     Rate and Rhythm: Normal rate and regular rhythm.  Pulmonary:  Comments: Decreased breath sounds bilaterally.  Abdominal:     Palpations: Abdomen is soft.  Musculoskeletal:        General: Normal range of motion.     Cervical back: Normal range of motion.  Skin:    General: Skin is warm.  Neurological:     General: No focal deficit present.      Mental Status: He is alert and oriented to person, place, and time.  Psychiatric:        Behavior: Behavior normal.        Judgment: Judgment normal.      LABORATORY DATA:  I have reviewed the data as listed Lab Results  Component Value Date   WBC 10.1 05/25/2022   HGB 11.3 (L) 05/25/2022   HCT 35.8 (L) 05/25/2022   MCV 87.3 05/25/2022   PLT 268 05/25/2022   Recent Labs    04/08/22 0904 04/20/22 1319 05/06/22 0843 05/25/22 0930  NA 133* 139 134* 138  K 3.4* 3.6 3.5 3.4*  CL 100 107 101 101  CO2 $Re'25 26 24 26  'mki$ GLUCOSE 240* 59* 330* 232*  BUN 7* $Remo'9 8 8  'daIhi$ CREATININE 0.83 0.90 1.01 0.99  CALCIUM 8.4* 8.9 8.7* 9.0  GFRNONAA >60 >60 >60 >60  PROT 6.9  --  7.0 7.0  ALBUMIN 2.9*  --  3.1* 3.2*  AST 17  --  20 20  ALT 10  --  8 9  ALKPHOS 90  --  87 97  BILITOT 0.3  --  0.3 0.4     RADIOGRAPHIC STUDIES: I have personally reviewed the radiological images as listed and agreed with the findings in the report. No results found.  ASSESSMENT & PLAN:   Primary cancer of right upper lobe of lung ([place]) # UNRESECTABLE/LOCALLY ADVANCED- Right upper lobe lung mass -concerning for malignancy [biopsy inconclusive/atypical cells]-  April 2023- PD-L1 positive [cirucologene].  Currently on single agent Keytruda . CT scan chest JULY 27th, 2023-necrotic cavitary lesion noted right upper lobe associated with rib destruction-question related to treatment versus underlying malignancy.  May 05, 2022-s/p bronchoscopy- with Dr.Aleskerov-significant purulent/necrotic debris noted.  Currently pending biopsy cytology/cultures.  I had a long discussion with the patient and wife regarding the difficult situation.  However given the absence of any distant metastatic disease-question palliative resection of the lung.    # S/p Evaluation with thoracic surgery [place], Dr.Lightfoot  Discussed with Dr. Lanney Gins.  # HOLD keytruda #4 today-given ongoing fatigue/overall stable imaging; also given above  lung findings risk of infections etc. however discussed with the wife/patient that if surgery not an option-would recommend reevaluation with imaging in few months and consider restarting therapy.   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  See above  #Right chest wall pain-/pleuritic- ON  fenatny patch 25 mcg.  Oxycodone 5 mg every 8 to 12 hours hours as needed overall stable.  # COPD/fatigue- [coughing/phlegm/ no fevers]- on albuterol/advair [smoking]- TSH- WNL;Continue  Xopenex/ipratropium nebs-; Mucinex- DM BID.  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 Bary Castilla.  Continue hold surgery given ongoing lung issues.  STABLE.  # Poorly controlled diabetes-blood sugars 113- 280;  Continue glipizide; and  Metformin. Monitor for now.   # IV mediport: functioning/STABLE  *appt thru mychart   # DISPOSITION:  # today- IVFs over 1 hour.   # 8/24-  IVFs over 1 hour.  # in 1 week- IVFs-1 lit /1 hourTuesdays/Thursday   #  in 2 weeks-  IVFs-1 lit /1 hourTuesdays/Thursday   # follow up in 3 weeks-Tuesdays; MD; labs- cbc/cmp'  IVFs-1 lit /1 hour- Dr.B      All questions were answered. The patient knows to call the clinic with any problems, questions or concerns.    [name], MD 06/15/2022 7:55 AM

## 2022-06-16 NOTE — Addendum Note (Signed)
Addended by: Leeann Must on: 06/16/2022 08:01 AM   Modules accepted: Orders

## 2022-06-17 ENCOUNTER — Inpatient Hospital Stay: Payer: Medicare PPO

## 2022-06-17 ENCOUNTER — Other Ambulatory Visit: Payer: Self-pay

## 2022-06-17 VITALS — BP 130/75 | HR 90 | Temp 97.3°F | Resp 18

## 2022-06-17 DIAGNOSIS — C3411 Malignant neoplasm of upper lobe, right bronchus or lung: Secondary | ICD-10-CM | POA: Diagnosis not present

## 2022-06-17 MED ORDER — SODIUM CHLORIDE 0.9 % IV SOLN
Freq: Once | INTRAVENOUS | Status: AC
  Filled 2022-06-17: qty 250

## 2022-06-17 MED ORDER — MONTELUKAST SODIUM 10 MG PO TABS: ORAL_TABLET | ORAL | 0 refills | Status: DC

## 2022-06-17 MED ORDER — HEPARIN SOD (PORK) LOCK FLUSH 100 UNIT/ML IV SOLN
500.0000 [IU] | Freq: Once | INTRAVENOUS | Status: AC | PRN
  Administered 2022-06-17: 500 [IU]
  Filled 2022-06-17: qty 5

## 2022-06-17 MED ORDER — SODIUM CHLORIDE 0.9% FLUSH
10.0000 mL | Freq: Once | INTRAVENOUS | Status: AC | PRN
  Administered 2022-06-17: 10 mL
  Filled 2022-06-17: qty 10

## 2022-06-18 ENCOUNTER — Other Ambulatory Visit: Payer: Self-pay | Admitting: Internal Medicine

## 2022-06-21 ENCOUNTER — Encounter: Payer: Self-pay | Admitting: Internal Medicine

## 2022-06-21 ENCOUNTER — Other Ambulatory Visit: Payer: Self-pay | Admitting: *Deleted

## 2022-06-21 MED ORDER — IPRATROPIUM-ALBUTEROL 0.5-2.5 (3) MG/3ML IN SOLN: 3.0000 mL | Freq: Four times a day (QID) | RESPIRATORY_TRACT | 1 refills | Status: DC | PRN

## 2022-06-21 MED ORDER — ALBUTEROL SULFATE HFA 108 (90 BASE) MCG/ACT IN AERS: INHALATION_SPRAY | RESPIRATORY_TRACT | 2 refills | Status: DC

## 2022-06-22 ENCOUNTER — Inpatient Hospital Stay: Payer: Medicare PPO

## 2022-06-22 VITALS — BP 152/83 | HR 82 | Temp 97.8°F | Wt 159.9 lb

## 2022-06-22 DIAGNOSIS — C3411 Malignant neoplasm of upper lobe, right bronchus or lung: Secondary | ICD-10-CM

## 2022-06-22 MED ORDER — SODIUM CHLORIDE 0.9% FLUSH
10.0000 mL | Freq: Once | INTRAVENOUS | Status: AC | PRN
  Administered 2022-06-22: 10 mL
  Filled 2022-06-22: qty 10

## 2022-06-22 MED ORDER — HEPARIN SOD (PORK) LOCK FLUSH 100 UNIT/ML IV SOLN
500.0000 [IU] | Freq: Once | INTRAVENOUS | Status: AC | PRN
  Administered 2022-06-22: 500 [IU]
  Filled 2022-06-22: qty 5

## 2022-06-22 MED ORDER — SODIUM CHLORIDE 0.9 % IV SOLN
Freq: Once | INTRAVENOUS | Status: AC
  Filled 2022-06-22: qty 250

## 2022-06-23 ENCOUNTER — Other Ambulatory Visit: Payer: Self-pay | Admitting: Internal Medicine

## 2022-06-23 MED FILL — Dexamethasone Sodium Phosphate Inj 100 MG/10ML: INTRAMUSCULAR | Qty: 1 | Status: AC

## 2022-06-24 ENCOUNTER — Inpatient Hospital Stay: Payer: Medicare PPO

## 2022-06-24 VITALS — BP 150/92 | HR 114 | Temp 108.0°F | Resp 18 | Wt 159.9 lb

## 2022-06-24 DIAGNOSIS — C3411 Malignant neoplasm of upper lobe, right bronchus or lung: Secondary | ICD-10-CM

## 2022-06-24 DIAGNOSIS — Z95828 Presence of other vascular implants and grafts: Secondary | ICD-10-CM

## 2022-06-24 MED ORDER — SODIUM CHLORIDE 0.9 % IV SOLN
Freq: Once | INTRAVENOUS | Status: AC
  Filled 2022-06-24: qty 250

## 2022-06-24 MED ORDER — HEPARIN SOD (PORK) LOCK FLUSH 100 UNIT/ML IV SOLN
250.0000 [IU] | Freq: Once | INTRAVENOUS | Status: DC | PRN
  Filled 2022-06-24: qty 5

## 2022-06-24 MED ORDER — HEPARIN SOD (PORK) LOCK FLUSH 100 UNIT/ML IV SOLN
500.0000 [IU] | Freq: Once | INTRAVENOUS | Status: AC
  Administered 2022-06-24: 500 [IU]
  Filled 2022-06-24: qty 5

## 2022-06-24 MED ORDER — SODIUM CHLORIDE 0.9% FLUSH
10.0000 mL | Freq: Once | INTRAVENOUS | Status: AC | PRN
  Administered 2022-06-24: 10 mL
  Filled 2022-06-24: qty 10

## 2022-06-29 ENCOUNTER — Ambulatory Visit: Payer: Medicare PPO

## 2022-06-29 ENCOUNTER — Inpatient Hospital Stay: Payer: Medicare PPO

## 2022-06-29 VITALS — BP 148/75 | HR 90 | Temp 97.3°F | Resp 19 | Wt 161.8 lb

## 2022-06-29 DIAGNOSIS — C3411 Malignant neoplasm of upper lobe, right bronchus or lung: Secondary | ICD-10-CM | POA: Diagnosis not present

## 2022-06-29 MED ORDER — HEPARIN SOD (PORK) LOCK FLUSH 100 UNIT/ML IV SOLN
500.0000 [IU] | Freq: Once | INTRAVENOUS | Status: AC | PRN
  Administered 2022-06-29: 500 [IU]
  Filled 2022-06-29: qty 5

## 2022-06-29 MED ORDER — SODIUM CHLORIDE 0.9 % IV SOLN
Freq: Once | INTRAVENOUS | Status: AC
  Filled 2022-06-29: qty 250

## 2022-06-29 MED ORDER — SODIUM CHLORIDE 0.9% FLUSH
10.0000 mL | Freq: Once | INTRAVENOUS | Status: AC | PRN
  Administered 2022-06-29: 10 mL
  Filled 2022-06-29: qty 10

## 2022-06-30 MED FILL — Dexamethasone Sodium Phosphate Inj 100 MG/10ML: INTRAMUSCULAR | Qty: 1 | Status: AC

## 2022-07-01 ENCOUNTER — Other Ambulatory Visit: Payer: Self-pay | Admitting: *Deleted

## 2022-07-01 ENCOUNTER — Ambulatory Visit: Payer: Medicare PPO

## 2022-07-01 ENCOUNTER — Inpatient Hospital Stay: Payer: Medicare PPO

## 2022-07-01 VITALS — BP 156/82 | HR 103 | Temp 98.4°F | Wt 159.9 lb

## 2022-07-01 DIAGNOSIS — C3411 Malignant neoplasm of upper lobe, right bronchus or lung: Secondary | ICD-10-CM | POA: Diagnosis not present

## 2022-07-01 MED ORDER — SODIUM CHLORIDE 0.9% FLUSH
10.0000 mL | Freq: Once | INTRAVENOUS | Status: AC | PRN
  Administered 2022-07-01: 10 mL
  Filled 2022-07-01: qty 10

## 2022-07-01 MED ORDER — PROPRANOLOL HCL ER 60 MG PO CP24: 60.0000 mg | ORAL_CAPSULE | Freq: Every day | ORAL | 3 refills | Status: DC

## 2022-07-01 MED ORDER — CLOBETASOL PROPIONATE 0.05 % EX CREA: 1.0000 | TOPICAL_CREAM | Freq: Two times a day (BID) | CUTANEOUS | 2 refills | Status: DC

## 2022-07-01 MED ORDER — HEPARIN SOD (PORK) LOCK FLUSH 100 UNIT/ML IV SOLN
500.0000 [IU] | Freq: Once | INTRAVENOUS | Status: AC | PRN
  Administered 2022-07-01: 500 [IU]
  Filled 2022-07-01: qty 5

## 2022-07-01 MED ORDER — OXYCODONE HCL 5 MG PO TABS: 5.0000 mg | ORAL_TABLET | Freq: Three times a day (TID) | ORAL | 0 refills | Status: DC | PRN

## 2022-07-01 MED ORDER — ESZOPICLONE 1 MG PO TABS: ORAL_TABLET | ORAL | 1 refills | Status: DC

## 2022-07-01 MED ORDER — SODIUM CHLORIDE 0.9 % IV SOLN
Freq: Once | INTRAVENOUS | Status: AC
  Filled 2022-07-01: qty 250

## 2022-07-04 ENCOUNTER — Other Ambulatory Visit: Payer: Self-pay | Admitting: Internal Medicine

## 2022-07-05 ENCOUNTER — Encounter: Payer: Self-pay | Admitting: Internal Medicine

## 2022-07-06 ENCOUNTER — Inpatient Hospital Stay: Payer: Medicare PPO | Attending: Internal Medicine

## 2022-07-06 ENCOUNTER — Ambulatory Visit
Admission: RE | Admit: 2022-07-06 | Discharge: 2022-07-06 | Disposition: A | Discharge status: Home / Self Care | Payer: Medicare PPO | Source: Ambulatory Visit | Attending: Internal Medicine | Admitting: Internal Medicine

## 2022-07-06 ENCOUNTER — Ambulatory Visit: Payer: Medicare PPO

## 2022-07-06 VITALS — BP 144/76 | HR 85 | Temp 97.8°F | Resp 18 | Wt 159.8 lb

## 2022-07-06 DIAGNOSIS — Z79899 Other long term (current) drug therapy: Secondary | ICD-10-CM | POA: Diagnosis not present

## 2022-07-06 DIAGNOSIS — Z7984 Long term (current) use of oral hypoglycemic drugs: Secondary | ICD-10-CM | POA: Insufficient documentation

## 2022-07-06 DIAGNOSIS — C3411 Malignant neoplasm of upper lobe, right bronchus or lung: Secondary | ICD-10-CM | POA: Diagnosis present

## 2022-07-06 DIAGNOSIS — I10 Essential (primary) hypertension: Secondary | ICD-10-CM | POA: Diagnosis not present

## 2022-07-06 DIAGNOSIS — E1165 Type 2 diabetes mellitus with hyperglycemia: Secondary | ICD-10-CM | POA: Insufficient documentation

## 2022-07-06 DIAGNOSIS — Z87442 Personal history of urinary calculi: Secondary | ICD-10-CM | POA: Diagnosis not present

## 2022-07-06 DIAGNOSIS — E278 Other specified disorders of adrenal gland: Secondary | ICD-10-CM | POA: Diagnosis not present

## 2022-07-06 DIAGNOSIS — E1140 Type 2 diabetes mellitus with diabetic neuropathy, unspecified: Secondary | ICD-10-CM | POA: Insufficient documentation

## 2022-07-06 MED ORDER — IOHEXOL 300 MG/ML  SOLN
100.0000 mL | Freq: Once | INTRAMUSCULAR | Status: AC | PRN
  Administered 2022-07-06: 100 mL via INTRAVENOUS

## 2022-07-06 MED ORDER — HEPARIN SOD (PORK) LOCK FLUSH 100 UNIT/ML IV SOLN
500.0000 [IU] | Freq: Once | INTRAVENOUS | Status: AC | PRN
  Administered 2022-07-06: 500 [IU]
  Filled 2022-07-06: qty 5

## 2022-07-06 MED ORDER — SODIUM CHLORIDE 0.9% FLUSH
10.0000 mL | Freq: Once | INTRAVENOUS | Status: AC | PRN
  Administered 2022-07-06: 10 mL
  Filled 2022-07-06: qty 10

## 2022-07-06 MED ORDER — SODIUM CHLORIDE 0.9 % IV SOLN
Freq: Once | INTRAVENOUS | Status: AC
  Filled 2022-07-06: qty 250

## 2022-07-07 ENCOUNTER — Ambulatory Visit: Admission: RE | Admit: 2022-07-07 | Payer: Medicare PPO | Source: Ambulatory Visit

## 2022-07-08 ENCOUNTER — Ambulatory Visit: Payer: Medicare PPO

## 2022-07-08 ENCOUNTER — Inpatient Hospital Stay: Payer: Medicare PPO

## 2022-07-08 VITALS — BP 138/77 | HR 99 | Temp 98.7°F | Resp 18 | Wt 160.9 lb

## 2022-07-08 DIAGNOSIS — C3411 Malignant neoplasm of upper lobe, right bronchus or lung: Secondary | ICD-10-CM | POA: Diagnosis not present

## 2022-07-08 MED ORDER — SODIUM CHLORIDE 0.9% FLUSH
10.0000 mL | Freq: Once | INTRAVENOUS | Status: AC | PRN
  Administered 2022-07-08: 10 mL
  Filled 2022-07-08: qty 10

## 2022-07-08 MED ORDER — HEPARIN SOD (PORK) LOCK FLUSH 100 UNIT/ML IV SOLN
500.0000 [IU] | Freq: Once | INTRAVENOUS | Status: AC | PRN
  Administered 2022-07-08: 500 [IU]
  Filled 2022-07-08: qty 5

## 2022-07-08 MED ORDER — SODIUM CHLORIDE 0.9 % IV SOLN
Freq: Once | INTRAVENOUS | Status: AC
  Filled 2022-07-08: qty 250

## 2022-07-13 ENCOUNTER — Inpatient Hospital Stay: Payer: Medicare PPO

## 2022-07-13 ENCOUNTER — Inpatient Hospital Stay (HOSPITAL_BASED_OUTPATIENT_CLINIC_OR_DEPARTMENT_OTHER): Payer: Medicare PPO | Admitting: Internal Medicine

## 2022-07-13 ENCOUNTER — Encounter: Payer: Self-pay | Admitting: Internal Medicine

## 2022-07-13 DIAGNOSIS — C3411 Malignant neoplasm of upper lobe, right bronchus or lung: Secondary | ICD-10-CM

## 2022-07-13 LAB — COMPREHENSIVE METABOLIC PANEL
ALT: 9 U/L (ref 0–44)
AST: 24 U/L (ref 15–41)
Albumin: 3.4 g/dL — ABNORMAL LOW (ref 3.5–5.0)
Alkaline Phosphatase: 95 U/L (ref 38–126)
Anion gap: 8 (ref 5–15)
BUN: 9 mg/dL (ref 8–23)
CO2: 28 mmol/L (ref 22–32)
Calcium: 8.6 mg/dL — ABNORMAL LOW (ref 8.9–10.3)
Chloride: 103 mmol/L (ref 98–111)
Creatinine, Ser: 0.88 mg/dL (ref 0.61–1.24)
GFR, Estimated: >60 mL/min (ref >60)
Glucose, Bld: 224 mg/dL — ABNORMAL HIGH (ref 70–99)
Potassium: 3.2 mmol/L — ABNORMAL LOW (ref 3.5–5.1)
Sodium: 139 mmol/L (ref 135–145)
Total Bilirubin: 0.4 mg/dL (ref 0.3–1.2)
Total Protein: 6.8 g/dL (ref 6.5–8.1)

## 2022-07-13 LAB — CBC WITH DIFFERENTIAL/PLATELET
Abs Immature Granulocytes: 0.02 10*3/uL (ref 0.00–0.07)
Basophils Absolute: 0.1 10*3/uL (ref 0.0–0.1)
Basophils Relative: 1 %
Eosinophils Absolute: 0.7 10*3/uL — ABNORMAL HIGH (ref 0.0–0.5)
Eosinophils Relative: 10 %
HCT: 35.6 % — ABNORMAL LOW (ref 39.0–52.0)
Hemoglobin: 11.3 g/dL — ABNORMAL LOW (ref 13.0–17.0)
Immature Granulocytes: 0 %
Lymphocytes Relative: 17 %
Lymphs Abs: 1.1 10*3/uL (ref 0.7–4.0)
MCH: 27.8 pg (ref 26.0–34.0)
MCHC: 31.7 g/dL (ref 30.0–36.0)
MCV: 87.5 fL (ref 80.0–100.0)
Monocytes Absolute: 0.5 10*3/uL (ref 0.1–1.0)
Monocytes Relative: 8 %
Neutro Abs: 4.3 10*3/uL (ref 1.7–7.7)
Neutrophils Relative %: 64 %
Platelets: 226 10*3/uL (ref 150–400)
RBC: 4.07 MIL/uL — ABNORMAL LOW (ref 4.22–5.81)
RDW: 15.2 % (ref 11.5–15.5)
WBC: 6.7 10*3/uL (ref 4.0–10.5)
nRBC: 0 % (ref 0.0–0.2)

## 2022-07-13 MED ORDER — HEPARIN SOD (PORK) LOCK FLUSH 100 UNIT/ML IV SOLN
500.0000 [IU] | Freq: Once | INTRAVENOUS | Status: AC | PRN
  Administered 2022-07-13: 500 [IU]
  Filled 2022-07-13: qty 5

## 2022-07-13 MED ORDER — SODIUM CHLORIDE 0.9 % IV SOLN
Freq: Once | INTRAVENOUS | Status: AC
  Filled 2022-07-13: qty 250

## 2022-07-13 MED ORDER — SODIUM CHLORIDE 0.9% FLUSH
10.0000 mL | Freq: Once | INTRAVENOUS | Status: AC | PRN
  Administered 2022-07-13: 10 mL
  Filled 2022-07-13: qty 10

## 2022-07-13 MED ORDER — OLMESARTAN MEDOXOMIL 40 MG PO TABS: 40.0000 mg | ORAL_TABLET | Freq: Every day | ORAL | 1 refills | Status: DC

## 2022-07-13 NOTE — Assessment & Plan Note (Addendum)
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   May 05, 2022-s/p bronchoscopy- with Dr.Aleskerov-significant purulent/necrotic debris noted.  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 There is some clearing of some of the right lower lobe airspace opacity and volume loss compared to the prior exam.  Chronic rib destruction noted fourth right and third rib-?  Radiation versus malignancy.  Prominent stable hilar/mediastinal lymph nodes.  Overall malignancy appears stable.   #Recommend continued surveillance without any progression of lung malignancy-especially given the concern for chronic/active infection/inflammation. Last [name] was in May-June 2023. Continue surveillance given the overall stability of disease.  We will plan repeating imaging in 3 months.  I have also informed Dr.Aleskerov of the above CT imaging.   #Right chest wall pain-/pleuritic-rib destruction malignancy versus radiation- ON  fenatny patch 25 mcg.  Oxycodone 5 mg every 8 to 12 hours hours as needed overall- STABLE.   # COPD/fatigue- [coughing/phlegm/ no fevers]- on albuterol/advair [smoking]- TSH- WNL;Continue  Xopenex/ipratropium nebs-; Mucinex- DM BID.  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 Bary Castilla.  CT OCt 5th, 2023-Filling defect in the cecum near the appendiceal orifice measuring about 3.4 by 2.9 cm, concerning for mass, as noted on prior exam. There is also abnormal dilation of the appendix up to 1.1 cm in diameter, similar to previous.  Patient not too keen on any surgical interventions at this time.  Will update Dr. Bary Castilla.  #  Poorly controlled diabetes-blood sugars 113- 280;  Continue glipizide; and  Metformin. STABLE.  # IV mediport: functioning/STABLE  *appt thru mychart   # DISPOSITION:  # today- IVFs over 1 hour.   # 10/12-  IVFs over 1 hour.  # in 1 week- IVFs-1 lit /1 hourTuesdays/Thursday   # in 2 weeks-  IVFs-1 lit /1 hourTuesdays/Thursday   # in 3 weeks-  IVFs-1 lit /1 hourTuesdays/Thursday   # follow up in 4 weeks-Tuesdays; MD; labs- cbc/cmp'  IVFs-1 lit /1 hour;... - Dr.B  # I reviewed the blood work- with the patient in detail; also reviewed the imaging independently [as summarized above]; and with the patient in detail.

## 2022-07-13 NOTE — Progress Notes (Signed)
Fellows NOTE  Patient Care Team: [name], MD as PCP - General (Family Medicine) [name], RN as Oncology Nurse Navigator [name], MD as Consulting Physician (Urology) [name], MD as Consulting Physician (Oncology)  CHIEF COMPLAINTS/PURPOSE OF CONSULTATION: lung cancer    Oncology History Overview Note  AUG 2022- 8.2 x 5.9 cm spiculated mass noted in right upper lobe consistent with malignancy. It extends from the right hilum to the lateral chest wall and results in lytic destruction of the lateral portion of the right fourth rib.  Mediastinal lymph nodes are noted, including 12 mm right hilar lymph node, consistent with metastatic disease. 3 cm right adrenal mass is noted concerning for metastatic disease.  # AUG 2022- PET IMPRESSION: Peripherally hypermetabolic right upper lobe mass, likely due to central necrosis. Mass extends into the right hilum and right lateral chest wall involving the second through fourth right ribs.   Mildly enlarged and hypermetabolic right hilar lymph node.   No evidence of metastatic disease in the abdomen or pelvis.  # AUG, 2022-RIGHT CHEST WALL Bx-necrosis; suspicious for malignancy not definitive [discussed with Dr.Kraynie;MDT] LUNG MASS, RIGHT; BIOPSY:  - SUSPICIOUS FOR MALIGNANCY.  - SEE COMMENT.   Comment:  Approximately six tissue cores demonstrate inflamed fibrous capsule with  hemosiderin-laden macrophages, in a background of abundant necrosis.  One of the cores demonstrates a minute fragment of viable epithelial  cells.  These cells are positive for p40.  They are negative for TTF1  and CK7. The cells of interest disappear on deeper cut levels.  While  the findings are suspicious for squamous cell carcinoma, there is not  enough viable tumor for a definitive diagnosis.  Additional tissue  sampling may be helpful.   # SEP 2022-cycle #2 Taxol hypersensitive reaction.  Switched  over to #3 cycle- carbo-Abraxane starting 06/29/2021  # DEC 2022- s/p ID- Dr.ravishankar- ? Lung abscess-no antibiotics-  JAN 2023- CT scan incidental- cecal mass- colonoscopy[Dr.Russo; [facility]- high grade dysplasia]  S/p evaluation with Dr. [name] surgery for now]   # S/p evaluation with Dr. [name].   Primary cancer of right upper lobe of lung ([place])  06/03/2021 Initial Diagnosis   Primary cancer of right upper lobe of lung ([place])   06/03/2021 Cancer Staging   Staging form: Lung, AJCC 8th Edition - Clinical: Stage IIIB (cT3, cN2, cM0) - Signed by [name], MD on 06/03/2021   06/15/2021 - 08/03/2021 Chemotherapy   Patient is on Treatment Plan : LUNG Carboplatin / Paclitaxel + XRT q7d     10/02/2021 - 10/19/2021 Chemotherapy   Patient is on Treatment Plan : LUNG Durvalumab q14d     02/04/2022 -  Chemotherapy   Patient is on Treatment Plan : LUNG NSCLC Pembrolizumab (200) q21d       HISTORY OF PRESENTING ILLNESS: Patient is ambulating independently.  Accompanied by wife.  Rodney Vega 66 y.o.  male history of smoking -"lung cancer" [Limited necrotic tissue]-locally advanced unresectable; and sigmoid colon cancer intact- currently on surveillance is here for follow-up/review of the CT scan.   Patient's Keytruda currently on hold given his necrotic mass in the right lung.    Overall patient's appetite and energy is improving.  Chronic right chest wall pain for which patient is taking Oxycodone 5 mg twice with occasional mid day dose if needed with good pain control.  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     Chronic mild shortness of breath mild  cough.  However no fever no chills.  He continues to get IV fluids-twice a week.  Denies abdominal pain nausea vomiting.  No blood in stools black or stools.  Review of Systems  Constitutional:  Positive for malaise/fatigue and weight loss. Negative for chills, diaphoresis and fever.  HENT:  Negative for nosebleeds and  sore throat.   Eyes:  Negative for double vision.  Respiratory:  Positive for cough, sputum production and shortness of breath. Negative for hemoptysis and wheezing.   Cardiovascular:  Positive for chest pain. Negative for palpitations, orthopnea and leg swelling.  Gastrointestinal:  Positive for nausea. Negative for abdominal pain, blood in stool, constipation, diarrhea, heartburn, melena and vomiting.  Genitourinary:  Negative for dysuria, frequency and urgency.  Musculoskeletal:  Negative for back pain and joint pain.  Skin: Negative.  Negative for itching and rash.  Neurological:  Positive for tingling. Negative for dizziness, focal weakness, weakness and headaches.  Endo/Heme/Allergies:  Does not bruise/bleed easily.  Psychiatric/Behavioral:  Negative for depression. The patient is not nervous/anxious and does not have insomnia.      MEDICAL HISTORY:  Past Medical History:  Diagnosis Date  . Anemia   . Cancer ([place])   . COPD (chronic obstructive pulmonary disease) ([place])   . Degenerative arthritis of knee   . Depression   . Diabetes mellitus without complication ([place])   . Diverticulitis   . Glaucoma    since 2004  . Hernia 1979  . History of kidney stones   . Hypertension   . Neuromuscular disorder ([place])   . Personal history of tobacco use, presenting hazards to health   . Pneumonia   . Pre-diabetes   . Screening for obesity   . Special screening for malignant neoplasms, colon   . Wears dentures    full upper and lower    SURGICAL HISTORY: Past Surgical History:  Procedure Laterality Date  . [place], 2012   lumbar bulging disc  . BRONCHOSCOPY  05/05/2022  . CATARACT EXTRACTION W/PHACO Left 01/20/2021   Procedure: CATARACT EXTRACTION PHACO AND INTRAOCULAR LENS PLACEMENT ([place]) LEFT;  Surgeon: Birder Robson, MD;  Location: [facility];  Service: Ophthalmology;  Laterality: Left;  10.13 0:52.1  . COLONOSCOPY  [id number]   [facility]/Dr. Buelah Manis sessile  polyp,66m, found in rectum,multiple diverticula were found in the sigmoid, in descending and in transverse colon  . COLONOSCOPY WITH PROPOFOL N/A 10/22/2021   Procedure: COLONOSCOPY WITH PROPOFOL;  Surgeon: RAnnamaria Helling DO;  [facility];  Service: Gastroenterology;  Laterality: N/A;  DM  . COLOSTOMY  04/20/2013  . EYE SURGERY    . HERNIA REPAIR  1979   inguinal  . IR IMAGING GUIDED PORT INSERTION  06/10/2021  . JOINT REPLACEMENT Right    knee  . KNEE ARTHROPLASTY Right 05/08/2018   Procedure: COMPUTER ASSISTED TOTAL KNEE ARTHROPLASTY;  Surgeon: HDereck Leep MD;  Location: [facility];  Service: Orthopedics;  Laterality: Right;  . KNEE ARTHROPLASTY Left 11/16/2019   Procedure: COMPUTER ASSISTED TOTAL KNEE ARTHROPLASTY;  Surgeon: HDereck Leep MD;  Location: [facility];  Service: Orthopedics;  Laterality: Left;  . LAPAROTOMY  04/20/2013   colon resection with colosotmy  . LITHOTRIPSY  2013  . LUNG BIOPSY  2023  . [place] VIDEO BRONCHOSCOPY WITH ENDOBRONCHIAL NAVIGATION N/A 05/05/2022   Procedure: VIDEO BRONCHOSCOPY WITH ENDOBRONCHIAL NAVIGATION;  Surgeon: AOttie Glazier MD;  Location: [facility];  Service: Thoracic;  Laterality: N/A;  . VIDEO  BRONCHOSCOPY WITH ENDOBRONCHIAL ULTRASOUND N/A 05/05/2022   Procedure: VIDEO BRONCHOSCOPY WITH ENDOBRONCHIAL ULTRASOUND;  Surgeon: Ottie Glazier, MD;  Location: [facility];  Service: Thoracic;  Laterality: N/A;    SOCIAL HISTORY: Social History   Socioeconomic History  . Marital status: Married    Spouse name: Pegge  . Number of children: Not on file  . Years of education: Not on file  . Highest education level: Not on file  Occupational History  . Not on file  Tobacco Use  . Smoking status: Every Day    Packs/day: 1.00    Years: 30.00    Total pack years: 30.00    Types: Cigarettes  . Smokeless tobacco: Never  . Tobacco comments:    since age 42 or 62  Vaping Use  . Vaping Use:  Never used  Substance and Sexual Activity  . Alcohol use: Not Currently    Alcohol/week: 2.0 standard drinks of alcohol    Types: 2 Standard drinks or equivalent per week    Comment: 1-2 drinks per week  . Drug use: Yes    Types: Marijuana    Comment: 2-3 week  . Sexual activity: Not on file  Other Topics Concern  . Not on file  Social History Narrative   15 mins [place]; smoking; not much alcohol. Worked for [employer], 2019. 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 Diabetes Mother   . Heart disease Mother   . Heart attack Father   . Prostate cancer Father     ALLERGIES:  is allergic to paclitaxel, ambien [zolpidem], nsaids, and aleve [naproxen sodium].  MEDICATIONS:  Current Outpatient Medications  Medication Sig Dispense Refill  . Accu-Chek Softclix Lancets lancets USE AS DIRECTED FOUR TIMES DAILY 100 each 12  . acetaminophen (TYLENOL) 500 MG tablet Take 2 tablets (1,000 mg total) by mouth daily as needed. Home med. 30 tablet 0  . albuterol (VENTOLIN HFA) 108 (90 Base) MCG/ACT inhaler SMARTSIG:2 inhalation Via Inhaler Every 6 Hours PRN 1 each 2  . blood glucose meter kit and supplies KIT Check your blood glucose levels twice a day; once in the morning before breakfast; and once in the evening after dinner. 1 each 0  . BREZTRI AEROSPHERE 160-9-4.8 MCG/ACT AERO Inhale 2 puffs into the lungs 2 (two) times daily.    . cetirizine (ZYRTEC) 10 MG tablet Take 10 mg by mouth daily.    . clobetasol cream (TEMOVATE) 6.38 % Apply 1 Application topically 2 (two) times daily. 30 g 2  . dorzolamide-timolol (COSOPT) 22.3-6.8 MG/ML ophthalmic solution Place 1 drop into both  eyes 2 (two) times daily.    . ergocalciferol (VITAMIN D2) 1.25 MG (50000 UT) capsule Take 1 capsule (50,000 Units total) by mouth once a week. 12 capsule 1  . eszopiclone (LUNESTA) 1 MG TABS tablet TAKE 1 TABLET(1 MG) BY MOUTH AT BEDTIME AS NEEDED FOR SLEEP 30 tablet 1  . glipiZIDE (GLUCOTROL) 5 MG tablet TAKE 1 TABLET(5 MG) BY MOUTH DAILY BEFORE BREAKFAST 90 tablet 2  . glucose blood test strip Check your blood glucose levels twice a day; once in the morning before breakfast; and once  in the evening after dinner. 100 each 12  . Hydrocod Polst-Chlorphen Polst (CHLORPHENIRAMINE-HYDROCODONE) 10-8 MG/5ML Take 5 mLs by mouth at bedtime as needed for cough. 140 mL 0  . ipratropium-albuterol (DUONEB) 0.5-2.5 (3) MG/3ML SOLN Take 3 mLs by nebulization every 6 (six) hours as needed. 360 mL 1  . latanoprost (XALATAN) 0.005 % ophthalmic solution Place 1 drop into both eyes at bedtime.    . lidocaine-prilocaine (EMLA) cream Apply 1 application. topically as needed. 30 g 3  . metFORMIN (GLUCOPHAGE) 500 MG tablet Take 1 tablet (500 mg total) by mouth 2 (two) times daily with a meal. 60 tablet 2  . montelukast (SINGULAIR) 10 MG tablet TAKE 1 TABLET(10 MG) BY MOUTH AT BEDTIME 90 tablet 0  . Multiple Vitamin (MULTIVITAMIN WITH MINERALS) TABS tablet Take 1 tablet by mouth daily.    Marland Kitchen olmesartan (BENICAR) 40 MG tablet Take 1 tablet (40 mg total) by mouth daily. 90 tablet 1  . oxyCODONE (OXY IR/ROXICODONE) 5 MG immediate release tablet Take 1 tablet (5 mg total) by mouth every 8 (eight) hours as needed for severe pain. 90 tablet 0  . Pembrolizumab (KEYTRUDA IV) Inject into the vein. Once every 3 weeks, receives at Infusion center    . pregabalin (LYRICA) 50 MG capsule Take 50 mg by mouth 2 (two) times daily.    . prochlorperazine (COMPAZINE) 10 MG tablet Take 10 mg by mouth every 6 (six) hours as needed for nausea or vomiting.    . propranolol ER (INDERAL LA) 60 MG 24 hr capsule TAKE 1 CAPSULE(60 MG) BY MOUTH DAILY  30 capsule 3  . Respiratory Therapy Supplies (NEBULIZER) DEVI Use as directed 1 each 0  . tamsulosin (FLOMAX) 0.4 MG CAPS capsule Take 0.4 mg by mouth daily.    Marland Kitchen triamcinolone ointment (KENALOG) 0.5 % Apply 1 Application topically 2 (two) times daily. 30 g 0  . venlafaxine XR (EFFEXOR-XR) 75 MG 24 hr capsule Take 225 mg by mouth daily.    . vitamin B-12 (CYANOCOBALAMIN) 500 MCG tablet Take 1 tablet by mouth daily.    Marland Kitchen albuterol (PROVENTIL) (2.5 MG/3ML) 0.083% nebulizer solution Take 2.5 mg by nebulization every 4 (four) hours as needed for wheezing or shortness of breath. (Patient not taking: Reported on 06/15/2022)    . dextromethorphan-guaiFENesin (MUCINEX DM) 30-600 MG 12hr tablet Take 1 tablet by mouth 2 (two) times daily as needed for cough. (Patient not taking: Reported on 06/15/2022)    . dronabinol (MARINOL) 5 MG capsule Take 1 capsule (5 mg total) by mouth 2 (two) times daily before lunch and supper. (Patient not taking: Reported on 06/15/2022) 60 capsule 1  . feeding supplement (ENSURE ENLIVE / ENSURE PLUS) LIQD Take 237 mLs by mouth 3 (three) times daily between meals. (Patient not taking: Reported on 07/13/2022)    . fentaNYL (DURAGESIC) 25 MCG/HR Place 1 patch onto the skin every 3 (three) days. (Patient not taking: Reported on 06/15/2022) 10 patch 0  . guaiFENesin-codeine 100-10 MG/5ML syrup Take 5 mLs by mouth daily as needed for cough. (Patient not taking: Reported on 06/15/2022) 120 mL 0  . potassium chloride SA (KLOR-CON M) 20 MEQ tablet Take 1 tablet (20 mEq total) by mouth daily. (Patient not taking: Reported on 06/15/2022) 15 tablet 0   No current facility-administered medications for this visit.   Facility-Administered Medications Ordered in Other Visits  Medication Dose Route Frequency Provider Last Rate Last Admin  . heparin lock flush 100 UNIT/ML injection           .  heparin lock flush 100 UNIT/ML injection           . heparin lock flush 100 UNIT/ML injection           .  heparin lock flush 100 unit/mL  500 Units Intracatheter Once PRN [name], MD          .  PHYSICAL EXAMINATION: ECOG PERFORMANCE STATUS: 1 - Symptomatic but completely ambulatory  Vitals:   07/13/22 1000  BP: 123/83  Pulse: 88  Resp: 18  Temp: 98.1 F (36.7 C)  SpO2: 99%      Filed Weights   07/13/22 1000  Weight: 160 lb 11.2 oz (72.9 kg)       Physical Exam Vitals and nursing note reviewed.  HENT:     Head: Normocephalic and atraumatic.     Mouth/Throat:     Pharynx: Oropharynx is clear.  Eyes:     Extraocular Movements: Extraocular movements intact.     Pupils: Pupils are equal, round, and reactive to light.  Cardiovascular:     Rate and Rhythm: Normal rate and regular rhythm.  Pulmonary:     Comments: Decreased breath sounds bilaterally.  Abdominal:     Palpations: Abdomen is soft.  Musculoskeletal:        General: Normal range of motion.     Cervical back: Normal range of motion.  Skin:    General: Skin is warm.  Neurological:     General: No focal deficit present.     Mental Status: He is alert and oriented to person, place, and time.  Psychiatric:        Behavior: Behavior normal.        Judgment: Judgment normal.     LABORATORY DATA:  I have reviewed the data as listed Lab Results  Component Value Date   WBC 6.7 07/13/2022   HGB 11.3 (L) 07/13/2022   HCT 35.6 (L) 07/13/2022   MCV 87.5 07/13/2022   PLT 226 07/13/2022   Recent Labs    05/25/22 0930 06/15/22 1344 07/13/22 1022  NA 138 139 139  K 3.4* 3.6 3.2*  CL 101 105 103  CO2 _0 GLUCOSE 232* 93 224*  BUN 8 7* 9  CREATININE 0.99 0.87 0.88  CALCIUM 9.0 8.5* 8.6*  GFRNONAA >60 >60 >60  PROT 7.0 6.7 6.8  ALBUMIN 3.2* 3.2* 3.4*  AST _1 ALT _2 ALKPHOS 97 89 95  BILITOT 0.4 0.4 0.4    RADIOGRAPHIC STUDIES: I have personally reviewed the radiological images as listed and agreed with the findings in the report. CT CHEST ABDOMEN PELVIS W  CONTRAST  Result Date: 07/08/2022 CLINICAL DATA:  Metastatic non-small cell lung cancer restaging. Chemotherapy and radiation therapy finished 1 year ago. * Tracking Code: BO * EXAM: CT CHEST, ABDOMEN, AND PELVIS WITH CONTRAST TECHNIQUE: Multidetector CT imaging of the chest, abdomen and pelvis was performed following the standard protocol during bolus administration of intravenous contrast. RADIATION DOSE REDUCTION: This exam was performed according to the departmental dose-optimization program which includes automated exposure control, adjustment of the mA and/or kV according to patient size and/or use of iterative reconstruction technique. CONTRAST:  134m OMNIPAQUE IOHEXOL 300 MG/ML  SOLN COMPARISON:  Multiple exams, including 04/29/2022 FINDINGS: CT CHEST FINDINGS Cardiovascular: Left Port-A-Cath tip: Lower SVC. Atherosclerotic calcification of the thoracic aorta and branch vessels. Mediastinum/Nodes: Rightward shift of mediastinal structures due to volume loss. Right paratracheal lymph node 0.9 cm in short axis on  image 14 series 2, formerly 0.8 cm. Subcarinal node 1.2 cm in short axis on image 27 series 2, formerly 1.3 cm. There is contrast throughout much of the thoracic esophagus, suggesting dysmotility or reflux. Indistinctly marginated left hilar node 1.1 cm in short axis on image 28 series 2, formerly 1.0 cm. 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 There is consolidation of the remainder of the right upper lobe with volume loss. Thick-walled bronchus intermedius with consolidation of the superior segment right lower lobe as before. There has been some clearing of some of the right lower lobe airspace opacity and volume loss compared the previous exam. Airway thickening is  present bilaterally. Tree-in-bud reticulonodular opacities in the aerated portion of the right lower lobe and right middle lobe, improved from previous, likely from atypical infectious bronchiolitis. Left apical pleuroparenchymal scarring. There is some new patchy airspace opacity anteromedially in the left upper lobe on image 68 series 3, probably infectious/inflammatory. Waxing and waning sub solid nodularity in the posterior basal segment left lower lobe, some areas improved in some worsened, likely inflammatory/infectious. One new more solid-appearing area of nodularity measures 1.6 by 0.9 cm in the left lower lobe, probably infectious but meriting surveillance. Emphysema noted. Musculoskeletal: Right greater than left degenerative glenohumeral arthropathy. Bony destructive erosive findings in the right fourth rib with chronic deformity of the right third rib as well. Stable lateral fracture of the right fifth rib. There is little in the way of associated healing response along the right fifth rib. Other more subtle right lateral rib deformities are also present. Some of the right rib deformities may be related to radiation therapy and some may be related to local tumor along the right upper chest. Mild levoconvex upper thoracic scoliosis. CT ABDOMEN PELVIS FINDINGS Hepatobiliary: Lateral segment left hepatic lobe prominence could be an early sign of cirrhosis. Otherwise unremarkable. Pancreas: Unremarkable Spleen: Unremarkable Adrenals/Urinary Tract: Stable 2.6 by 2.2 cm right adrenal adenoma as shown on prior exams including noncontrast chest CT from 04/29/2022. Large cluster of numerous calculi in the left kidney lower pole with the cluster measuring 2.6 by 1.0 cm on image 88 series 4. Mildly dilated left renal collecting system with normal caliber ureter but no stone at the UPJ, query chronic mild left UPJ narrowing. Urinary bladder unremarkable. Stomach/Bowel: Small fatty lipoma along the transverse  duodenal lumen region. Filling defect in the cecum suggesting mass near the appendiceal orifice, measuring about 3.4 by 2.9 cm on image 69 series 4. There is abnormal dilation of the appendix, 1.1 cm in diameter on image 91 series 2, similar to previous. Hartmann's pouch noted. Questionable wall thickening in the rectum for example on image 113 series 4. Left colostomy noted. Scant diverticula of the descending colon. Vascular/Lymphatic: Atherosclerosis is present, including aortoiliac atherosclerotic disease. Mild narrowing of the proximal celiac trunk likely due to soft plaque. No pathologic adenopathy identified. Reproductive: Unremarkable Other: No supplemental non-categorized findings. Musculoskeletal: Lumbar spondylosis and degenerative disc disease especially at L4-5 and L5-S1, with bilateral foraminal impingement at L3-4, L4-5, and L5-S1. IMPRESSION: 1. 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 There is some clearing of some of the right lower lobe airspace opacity and volume loss compared  to the prior exam. 2. Waxing and waning reticulonodular opacities in the left lower lobe, likely infectious/inflammatory. There is some new patchy airspace opacity anteromedially in the left upper lobe which is probably infectious/inflammatory. Waxing and waning nodularity in the left lower lobe, probably inflammatory, merits surveillance. 3. Chronic bony destructive findings in the right fourth rib and right third rib, possibly from prior radiation therapy. Similar pattern of prior right rib fractures. 4. Stable mildly prominent mediastinal and left hilar lymph nodes. 5. Filling defect in the cecum near the appendiceal orifice, measuring about 3.4 by 2.9 cm, concerning for mass, as noted on prior exam. There is also abnormal dilation of the appendix up to 1.1 cm in  diameter, similar to previous. 6. Stable 2.6 by 2.2 cm right adrenal adenoma. 7. Large cluster of numerous calculi in the left kidney lower pole, unchanged. Mild left UPJ narrowing. 8. Questionable wall thickening in the rectum. Hartmann's pouch and descending colostomy noted. Aortic Atherosclerosis (ICD10-I70.0). Electronically Signed   By: Van Clines M.D.   On: 07/08/2022 08:32    ASSESSMENT & PLAN:   Primary cancer of right upper lobe of lung ([place]) # UNRESECTABLE/LOCALLY ADVANCED- Right upper lobe lung mass -concerning for malignancy [biopsy inconclusive/atypical cells]-  April 2023- PD-L1 positive [cirucologene].   May 05, 2022-s/p bronchoscopy- with Dr.Aleskerov-significant purulent/necrotic debris noted.  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 There is some clearing of some of the right lower lobe airspace opacity and volume loss compared to the prior exam.  Chronic rib destruction noted fourth right and third rib-?  Radiation versus malignancy.  Prominent stable hilar/mediastinal lymph nodes.  Overall malignancy appears stable.   #Recommend continued surveillance without any progression of lung malignancy-especially given the concern for chronic/active infection/inflammation. Last [name] was in May-June 2023. Continue surveillance given the overall stability of disease.  We will plan repeating imaging in 3 months.  I have also informed Dr.Aleskerov of the above CT imaging.   #Right chest wall pain-/pleuritic-rib destruction malignancy versus radiation- ON  fenatny patch 25 mcg.  Oxycodone 5 mg every 8 to 12 hours hours as needed overall- STABLE.   # COPD/fatigue- [coughing/phlegm/ no fevers]- on albuterol/advair [smoking]- TSH- WNL;Continue  Xopenex/ipratropium nebs-; Mucinex- DM BID.  Stable  # Cecal mass  ~ 3 cm s/p colonoscopy-cecal mass clinically suggestive of malignancy-biopsy high-grade dysplasia;s/p evaluation Dr. Bary Castilla.  CT OCt 5th, 2023-Filling defect in the cecum near the appendiceal orifice measuring about 3.4 by 2.9 cm, concerning for mass, as noted on prior exam. There is also abnormal dilation of the appendix up to 1.1 cm in diameter, similar to previous.  Patient not too keen on any surgical interventions at this time.  Will update Dr. Bary Castilla.  # Poorly controlled diabetes-blood sugars 113- 280;  Continue glipizide; and  Metformin. STABLE.  # IV mediport: functioning/STABLE  *appt thru mychart   # DISPOSITION:  # today- IVFs over 1 hour.   # 10/12-  IVFs over 1 hour.  # in 1 week- IVFs-1 lit /1 hourTuesdays/Thursday   # in 2 weeks-  IVFs-1 lit /1 hourTuesdays/Thursday   # in 3 weeks-  IVFs-1 lit /1 hourTuesdays/Thursday   # follow up in 4 weeks-Tuesdays; MD; labs- cbc/cmp'  IVFs-1 lit /1 hour;... - Dr.B  # I reviewed the blood work- with the patient in detail; also reviewed the imaging  independently [as summarized above]; and with the patient in detail.         All questions were answered. The patient knows to call the clinic with any problems, questions or concerns.    [name], MD 07/13/2022 12:17 PM

## 2022-07-13 NOTE — Progress Notes (Signed)
Patient's appetite and energy is improving.  Taking Oxycodone 5 mg twice with occasional mid day dose if needed with good pain control.    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    Has started back on Olmesartan 40 mg for 2 weeks, BP 123/83, HR 88.  Will need a refill if he is to continue.

## 2022-07-15 ENCOUNTER — Inpatient Hospital Stay: Payer: Medicare PPO

## 2022-07-15 ENCOUNTER — Ambulatory Visit: Payer: Medicare PPO

## 2022-07-15 ENCOUNTER — Ambulatory Visit
Admission: RE | Admit: 2022-07-15 | Discharge: 2022-07-15 | Disposition: A | Discharge status: Home / Self Care | Payer: Medicare PPO | Attending: Hospice and Palliative Medicine | Admitting: Hospice and Palliative Medicine

## 2022-07-15 ENCOUNTER — Ambulatory Visit
Admission: RE | Admit: 2022-07-15 | Discharge: 2022-07-15 | Disposition: A | Discharge status: Home / Self Care | Payer: Medicare PPO | Source: Ambulatory Visit | Attending: Hospice and Palliative Medicine | Admitting: Hospice and Palliative Medicine

## 2022-07-15 ENCOUNTER — Telehealth: Payer: Self-pay | Admitting: Internal Medicine

## 2022-07-15 VITALS — BP 150/79 | HR 93 | Temp 98.0°F | Resp 19 | Wt 161.5 lb

## 2022-07-15 DIAGNOSIS — C3411 Malignant neoplasm of upper lobe, right bronchus or lung: Secondary | ICD-10-CM | POA: Insufficient documentation

## 2022-07-15 DIAGNOSIS — R059 Cough, unspecified: Secondary | ICD-10-CM | POA: Diagnosis not present

## 2022-07-15 MED ORDER — HEPARIN SOD (PORK) LOCK FLUSH 100 UNIT/ML IV SOLN
500.0000 [IU] | Freq: Once | INTRAVENOUS | Status: AC | PRN
  Administered 2022-07-15: 500 [IU]
  Filled 2022-07-15: qty 5

## 2022-07-15 MED ORDER — SODIUM CHLORIDE 0.9 % IV SOLN
Freq: Once | INTRAVENOUS | Status: AC
  Filled 2022-07-15: qty 250

## 2022-07-15 MED ORDER — SODIUM CHLORIDE 0.9% FLUSH
10.0000 mL | Freq: Once | INTRAVENOUS | Status: AC | PRN
  Administered 2022-07-15: 10 mL
  Filled 2022-07-15: qty 10

## 2022-07-15 NOTE — Telephone Encounter (Signed)
pt spouse called in requesting that results from Chest XRay be sent to Dr. Radene Gunning at [facility] as well

## 2022-07-19 NOTE — Telephone Encounter (Signed)
Cxr result routed to pulmonologist

## 2022-07-20 ENCOUNTER — Inpatient Hospital Stay: Payer: Medicare PPO

## 2022-07-20 VITALS — BP 134/79 | HR 92 | Temp 97.8°F | Resp 18 | Wt 160.8 lb

## 2022-07-20 DIAGNOSIS — C3411 Malignant neoplasm of upper lobe, right bronchus or lung: Secondary | ICD-10-CM | POA: Diagnosis not present

## 2022-07-20 MED ORDER — HEPARIN SOD (PORK) LOCK FLUSH 100 UNIT/ML IV SOLN
500.0000 [IU] | Freq: Once | INTRAVENOUS | Status: AC | PRN
  Administered 2022-07-20: 500 [IU]
  Filled 2022-07-20: qty 5

## 2022-07-20 MED ORDER — SODIUM CHLORIDE 0.9 % IV SOLN
Freq: Once | INTRAVENOUS | Status: AC
  Filled 2022-07-20: qty 250

## 2022-07-20 MED ORDER — SODIUM CHLORIDE 0.9% FLUSH
10.0000 mL | Freq: Once | INTRAVENOUS | Status: AC | PRN
  Administered 2022-07-20: 10 mL
  Filled 2022-07-20: qty 10

## 2022-07-21 MED FILL — Dexamethasone Sodium Phosphate Inj 100 MG/10ML: INTRAMUSCULAR | Qty: 1 | Status: AC

## 2022-07-22 ENCOUNTER — Inpatient Hospital Stay: Payer: Medicare PPO

## 2022-07-22 VITALS — BP 131/76 | HR 96 | Temp 97.8°F

## 2022-07-22 DIAGNOSIS — C3411 Malignant neoplasm of upper lobe, right bronchus or lung: Secondary | ICD-10-CM | POA: Diagnosis not present

## 2022-07-22 MED ORDER — SODIUM CHLORIDE 0.9 % IV SOLN
Freq: Once | INTRAVENOUS | Status: AC
  Filled 2022-07-22: qty 250

## 2022-07-22 MED ORDER — SODIUM CHLORIDE 0.9% FLUSH
10.0000 mL | Freq: Once | INTRAVENOUS | Status: AC | PRN
  Administered 2022-07-22: 10 mL
  Filled 2022-07-22: qty 10

## 2022-07-22 MED ORDER — HEPARIN SOD (PORK) LOCK FLUSH 100 UNIT/ML IV SOLN
500.0000 [IU] | Freq: Once | INTRAVENOUS | Status: AC | PRN
  Administered 2022-07-22: 500 [IU]
  Filled 2022-07-22: qty 5

## 2022-07-23 ENCOUNTER — Other Ambulatory Visit: Payer: Self-pay | Admitting: *Deleted

## 2022-07-23 MED ORDER — IPRATROPIUM-ALBUTEROL 0.5-2.5 (3) MG/3ML IN SOLN: 3.0000 mL | Freq: Four times a day (QID) | RESPIRATORY_TRACT | 1 refills | Status: DC | PRN

## 2022-07-23 NOTE — Telephone Encounter (Signed)
Refill request - for duoneb from pharmacy.

## 2022-07-26 MED FILL — Dexamethasone Sodium Phosphate Inj 100 MG/10ML: INTRAMUSCULAR | Qty: 1 | Status: AC

## 2022-07-27 ENCOUNTER — Telehealth: Payer: Self-pay | Admitting: *Deleted

## 2022-07-27 ENCOUNTER — Inpatient Hospital Stay: Payer: Medicare PPO

## 2022-07-27 VITALS — BP 141/78 | HR 81 | Temp 96.1°F | Resp 18 | Wt 159.9 lb

## 2022-07-27 DIAGNOSIS — C3411 Malignant neoplasm of upper lobe, right bronchus or lung: Secondary | ICD-10-CM

## 2022-07-27 MED ORDER — SODIUM CHLORIDE 0.9% FLUSH
10.0000 mL | Freq: Once | INTRAVENOUS | Status: AC | PRN
  Administered 2022-07-27: 10 mL
  Filled 2022-07-27: qty 10

## 2022-07-27 MED ORDER — HEPARIN SOD (PORK) LOCK FLUSH 100 UNIT/ML IV SOLN
500.0000 [IU] | Freq: Once | INTRAVENOUS | Status: AC | PRN
  Administered 2022-07-27: 500 [IU]
  Filled 2022-07-27: qty 5

## 2022-07-27 MED ORDER — PREGABALIN 50 MG PO CAPS: 50.0000 mg | ORAL_CAPSULE | Freq: Two times a day (BID) | ORAL | 0 refills | Status: DC

## 2022-07-27 MED ORDER — SODIUM CHLORIDE 0.9 % IV SOLN
Freq: Once | INTRAVENOUS | Status: AC
  Filled 2022-07-27: qty 250

## 2022-07-27 NOTE — Telephone Encounter (Signed)
Request refill lyrica. Sent into pharmacy.

## 2022-07-28 LAB — ACID FAST CULTURE WITH REFLEXED SENSITIVITIES (MYCOBACTERIA): Acid Fast Culture: NEGATIVE

## 2022-07-29 ENCOUNTER — Inpatient Hospital Stay: Payer: Medicare PPO

## 2022-07-29 ENCOUNTER — Telehealth: Payer: Self-pay | Admitting: *Deleted

## 2022-07-29 ENCOUNTER — Other Ambulatory Visit: Payer: Self-pay | Admitting: *Deleted

## 2022-07-29 VITALS — BP 145/71 | HR 82 | Temp 98.3°F | Resp 18

## 2022-07-29 DIAGNOSIS — C3411 Malignant neoplasm of upper lobe, right bronchus or lung: Secondary | ICD-10-CM

## 2022-07-29 MED ORDER — PREGABALIN 50 MG PO CAPS: 50.0000 mg | ORAL_CAPSULE | Freq: Two times a day (BID) | ORAL | 0 refills | Status: DC

## 2022-07-29 MED ORDER — SODIUM CHLORIDE 0.9% FLUSH
10.0000 mL | Freq: Once | INTRAVENOUS | Status: AC | PRN
  Administered 2022-07-29: 10 mL
  Filled 2022-07-29: qty 10

## 2022-07-29 MED ORDER — SODIUM CHLORIDE 0.9 % IV SOLN
Freq: Once | INTRAVENOUS | Status: AC
  Filled 2022-07-29: qty 250

## 2022-07-29 MED ORDER — HEPARIN SOD (PORK) LOCK FLUSH 100 UNIT/ML IV SOLN
500.0000 [IU] | Freq: Once | INTRAVENOUS | Status: AC | PRN
  Administered 2022-07-29: 500 [IU]
  Filled 2022-07-29: qty 5

## 2022-07-29 NOTE — Telephone Encounter (Signed)
Lyrica prescription did not e scribe on 10 /24/23. Will send to [facility] for approval

## 2022-07-30 LAB — ACID FAST SMEAR (AFB, MYCOBACTERIA): Acid Fast Smear: NEGATIVE

## 2022-08-02 ENCOUNTER — Encounter: Payer: Self-pay | Admitting: Internal Medicine

## 2022-08-02 NOTE — Telephone Encounter (Signed)
Phone note duplicate

## 2022-08-03 ENCOUNTER — Other Ambulatory Visit: Payer: Self-pay | Admitting: *Deleted

## 2022-08-03 ENCOUNTER — Inpatient Hospital Stay: Payer: Medicare PPO

## 2022-08-03 VITALS — BP 166/89 | HR 93 | Temp 97.8°F | Resp 18 | Wt 161.0 lb

## 2022-08-03 DIAGNOSIS — C3411 Malignant neoplasm of upper lobe, right bronchus or lung: Secondary | ICD-10-CM

## 2022-08-03 MED ORDER — METFORMIN HCL 500 MG PO TABS: 500.0000 mg | ORAL_TABLET | Freq: Two times a day (BID) | ORAL | 2 refills | Status: DC

## 2022-08-03 MED ORDER — OXYCODONE HCL 5 MG PO TABS: 5.0000 mg | ORAL_TABLET | Freq: Three times a day (TID) | ORAL | 0 refills | Status: DC | PRN

## 2022-08-03 MED ORDER — SODIUM CHLORIDE 0.9 % IV SOLN
Freq: Once | INTRAVENOUS | Status: AC
  Filled 2022-08-03: qty 250

## 2022-08-03 MED ORDER — SODIUM CHLORIDE 0.9% FLUSH
10.0000 mL | Freq: Once | INTRAVENOUS | Status: AC | PRN
  Administered 2022-08-03: 10 mL
  Filled 2022-08-03: qty 10

## 2022-08-03 MED ORDER — ESZOPICLONE 1 MG PO TABS: ORAL_TABLET | ORAL | 1 refills | Status: DC

## 2022-08-03 MED ORDER — HEPARIN SOD (PORK) LOCK FLUSH 100 UNIT/ML IV SOLN
500.0000 [IU] | Freq: Once | INTRAVENOUS | Status: AC | PRN
  Administered 2022-08-03: 500 [IU]
  Filled 2022-08-03: qty 5

## 2022-08-03 NOTE — Telephone Encounter (Signed)
Medication refills

## 2022-08-04 ENCOUNTER — Telehealth: Payer: Self-pay | Admitting: *Deleted

## 2022-08-04 NOTE — Telephone Encounter (Signed)
Phone note Opened in error

## 2022-08-05 ENCOUNTER — Inpatient Hospital Stay: Payer: Medicare PPO | Attending: Internal Medicine

## 2022-08-05 ENCOUNTER — Other Ambulatory Visit: Payer: Self-pay | Admitting: *Deleted

## 2022-08-05 VITALS — BP 161/79 | HR 84 | Temp 97.4°F | Resp 20 | Wt 164.0 lb

## 2022-08-05 DIAGNOSIS — Z9221 Personal history of antineoplastic chemotherapy: Secondary | ICD-10-CM | POA: Diagnosis not present

## 2022-08-05 DIAGNOSIS — C3411 Malignant neoplasm of upper lobe, right bronchus or lung: Secondary | ICD-10-CM | POA: Diagnosis present

## 2022-08-05 DIAGNOSIS — E1140 Type 2 diabetes mellitus with diabetic neuropathy, unspecified: Secondary | ICD-10-CM | POA: Insufficient documentation

## 2022-08-05 DIAGNOSIS — E11649 Type 2 diabetes mellitus with hypoglycemia without coma: Secondary | ICD-10-CM | POA: Diagnosis not present

## 2022-08-05 DIAGNOSIS — H0520 Unspecified exophthalmos: Secondary | ICD-10-CM | POA: Insufficient documentation

## 2022-08-05 DIAGNOSIS — Z85038 Personal history of other malignant neoplasm of large intestine: Secondary | ICD-10-CM | POA: Insufficient documentation

## 2022-08-05 DIAGNOSIS — Z7984 Long term (current) use of oral hypoglycemic drugs: Secondary | ICD-10-CM | POA: Insufficient documentation

## 2022-08-05 DIAGNOSIS — I10 Essential (primary) hypertension: Secondary | ICD-10-CM | POA: Diagnosis not present

## 2022-08-05 DIAGNOSIS — R0789 Other chest pain: Secondary | ICD-10-CM | POA: Insufficient documentation

## 2022-08-05 DIAGNOSIS — E876 Hypokalemia: Secondary | ICD-10-CM | POA: Diagnosis not present

## 2022-08-05 DIAGNOSIS — F17210 Nicotine dependence, cigarettes, uncomplicated: Secondary | ICD-10-CM | POA: Diagnosis not present

## 2022-08-05 DIAGNOSIS — Z79899 Other long term (current) drug therapy: Secondary | ICD-10-CM | POA: Diagnosis not present

## 2022-08-05 DIAGNOSIS — E119 Type 2 diabetes mellitus without complications: Secondary | ICD-10-CM | POA: Diagnosis not present

## 2022-08-05 DIAGNOSIS — J449 Chronic obstructive pulmonary disease, unspecified: Secondary | ICD-10-CM | POA: Insufficient documentation

## 2022-08-05 DIAGNOSIS — G893 Neoplasm related pain (acute) (chronic): Secondary | ICD-10-CM | POA: Diagnosis not present

## 2022-08-05 DIAGNOSIS — Z87442 Personal history of urinary calculi: Secondary | ICD-10-CM | POA: Insufficient documentation

## 2022-08-05 MED ORDER — SODIUM CHLORIDE 0.9 % IV SOLN
Freq: Once | INTRAVENOUS | Status: AC
  Filled 2022-08-05: qty 250

## 2022-08-05 MED ORDER — SODIUM CHLORIDE 0.9% FLUSH
10.0000 mL | Freq: Once | INTRAVENOUS | Status: AC | PRN
  Administered 2022-08-05: 10 mL
  Filled 2022-08-05: qty 10

## 2022-08-05 MED ORDER — OXYCODONE HCL 5 MG PO TABS: 5.0000 mg | ORAL_TABLET | Freq: Three times a day (TID) | ORAL | 0 refills | Status: DC | PRN

## 2022-08-05 MED ORDER — ESZOPICLONE 1 MG PO TABS: ORAL_TABLET | ORAL | 1 refills | Status: DC

## 2022-08-05 MED ORDER — HEPARIN SOD (PORK) LOCK FLUSH 100 UNIT/ML IV SOLN
500.0000 [IU] | Freq: Once | INTRAVENOUS | Status: AC | PRN
  Administered 2022-08-05: 500 [IU]
  Filled 2022-08-05: qty 5

## 2022-08-05 NOTE — Telephone Encounter (Signed)
Per patient's wife- scripts- printed on 10/31 and did not escribe. Sent back to josh to approve.

## 2022-08-10 ENCOUNTER — Encounter: Payer: Self-pay | Admitting: Medical Oncology

## 2022-08-10 ENCOUNTER — Inpatient Hospital Stay: Payer: Medicare PPO

## 2022-08-10 ENCOUNTER — Other Ambulatory Visit: Payer: Self-pay

## 2022-08-10 ENCOUNTER — Inpatient Hospital Stay (HOSPITAL_BASED_OUTPATIENT_CLINIC_OR_DEPARTMENT_OTHER): Payer: Medicare PPO | Admitting: Medical Oncology

## 2022-08-10 ENCOUNTER — Inpatient Hospital Stay: Payer: Medicare PPO | Admitting: Internal Medicine

## 2022-08-10 VITALS — BP 134/83 | HR 88 | Temp 96.9°F | Resp 20 | Ht 72.0 in | Wt 166.6 lb

## 2022-08-10 DIAGNOSIS — C3411 Malignant neoplasm of upper lobe, right bronchus or lung: Secondary | ICD-10-CM

## 2022-08-10 DIAGNOSIS — E876 Hypokalemia: Secondary | ICD-10-CM | POA: Diagnosis not present

## 2022-08-10 DIAGNOSIS — Z95828 Presence of other vascular implants and grafts: Secondary | ICD-10-CM

## 2022-08-10 DIAGNOSIS — G893 Neoplasm related pain (acute) (chronic): Secondary | ICD-10-CM

## 2022-08-10 DIAGNOSIS — H539 Unspecified visual disturbance: Secondary | ICD-10-CM

## 2022-08-10 DIAGNOSIS — E162 Hypoglycemia, unspecified: Secondary | ICD-10-CM | POA: Diagnosis not present

## 2022-08-10 DIAGNOSIS — R531 Weakness: Secondary | ICD-10-CM

## 2022-08-10 DIAGNOSIS — H0520 Unspecified exophthalmos: Secondary | ICD-10-CM

## 2022-08-10 DIAGNOSIS — E860 Dehydration: Secondary | ICD-10-CM

## 2022-08-10 LAB — CBC WITH DIFFERENTIAL/PLATELET
Abs Immature Granulocytes: 0.03 10*3/uL (ref 0.00–0.07)
Basophils Absolute: 0.1 10*3/uL (ref 0.0–0.1)
Basophils Relative: 1 %
Eosinophils Absolute: 0.6 10*3/uL — ABNORMAL HIGH (ref 0.0–0.5)
Eosinophils Relative: 8 %
HCT: 35.8 % — ABNORMAL LOW (ref 39.0–52.0)
Hemoglobin: 11.5 g/dL — ABNORMAL LOW (ref 13.0–17.0)
Immature Granulocytes: 0 %
Lymphocytes Relative: 16 %
Lymphs Abs: 1.2 10*3/uL (ref 0.7–4.0)
MCH: 28.3 pg (ref 26.0–34.0)
MCHC: 32.1 g/dL (ref 30.0–36.0)
MCV: 88.2 fL (ref 80.0–100.0)
Monocytes Absolute: 0.5 10*3/uL (ref 0.1–1.0)
Monocytes Relative: 7 %
Neutro Abs: 5.2 10*3/uL (ref 1.7–7.7)
Neutrophils Relative %: 68 %
Platelets: 217 10*3/uL (ref 150–400)
RBC: 4.06 MIL/uL — ABNORMAL LOW (ref 4.22–5.81)
RDW: 14.3 % (ref 11.5–15.5)
WBC: 7.6 10*3/uL (ref 4.0–10.5)
nRBC: 0 % (ref 0.0–0.2)

## 2022-08-10 LAB — COMPREHENSIVE METABOLIC PANEL
ALT: 8 U/L (ref 0–44)
AST: 22 U/L (ref 15–41)
Albumin: 3.5 g/dL (ref 3.5–5.0)
Alkaline Phosphatase: 92 U/L (ref 38–126)
Anion gap: 9 (ref 5–15)
BUN: 9 mg/dL (ref 8–23)
CO2: 26 mmol/L (ref 22–32)
Calcium: 8.7 mg/dL — ABNORMAL LOW (ref 8.9–10.3)
Chloride: 103 mmol/L (ref 98–111)
Creatinine, Ser: 0.97 mg/dL (ref 0.61–1.24)
GFR, Estimated: >60 mL/min (ref >60)
Glucose, Bld: 201 mg/dL — ABNORMAL HIGH (ref 70–99)
Potassium: 3.3 mmol/L — ABNORMAL LOW (ref 3.5–5.1)
Sodium: 138 mmol/L (ref 135–145)
Total Bilirubin: 0.4 mg/dL (ref 0.3–1.2)
Total Protein: 6.8 g/dL (ref 6.5–8.1)

## 2022-08-10 MED ORDER — HEPARIN SOD (PORK) LOCK FLUSH 100 UNIT/ML IV SOLN
500.0000 [IU] | Freq: Once | INTRAVENOUS | Status: AC
  Administered 2022-08-10: 500 [IU]
  Filled 2022-08-10: qty 5

## 2022-08-10 MED ORDER — SODIUM CHLORIDE 0.9% FLUSH
10.0000 mL | Freq: Once | INTRAVENOUS | Status: AC
  Administered 2022-08-10: 10 mL via INTRAVENOUS
  Filled 2022-08-10: qty 10

## 2022-08-10 MED ORDER — POTASSIUM CHLORIDE CRYS ER 20 MEQ PO TBCR: 20.0000 meq | EXTENDED_RELEASE_TABLET | Freq: Every day | ORAL | 1 refills | Status: DC

## 2022-08-10 MED ORDER — SODIUM CHLORIDE 0.9% FLUSH
10.0000 mL | Freq: Once | INTRAVENOUS | Status: DC
  Filled 2022-08-10: qty 10

## 2022-08-10 MED ORDER — POTASSIUM CHLORIDE 20 MEQ/100ML IV SOLN
20.0000 meq | Freq: Once | INTRAVENOUS | Status: AC
  Administered 2022-08-10: 20 meq via INTRAVENOUS

## 2022-08-10 MED ORDER — SODIUM CHLORIDE 0.9 % IV SOLN
INTRAVENOUS | Status: DC
  Filled 2022-08-10 (×2): qty 250

## 2022-08-10 NOTE — Progress Notes (Signed)
[place] NOTE  Patient Care Team: [name], MD as PCP - General (Family Medicine) [name], RN as Oncology Nurse Navigator [name], [name], MD as Consulting Physician (Urology) [name], MD as Consulting Physician (Oncology) Ottie Glazier, MD as Consulting Physician (Pulmonary Disease)  CHIEF COMPLAINTS/PURPOSE OF CONSULTATION: lung cancer    Oncology History Overview Note  AUG 2022- 8.2 x 5.9 cm spiculated mass noted in right upper lobe consistent with malignancy. It extends from the right hilum to the lateral chest wall and results in lytic destruction of the lateral portion of the right fourth rib.  Mediastinal lymph nodes are noted, including 12 mm right hilar lymph node, consistent with metastatic disease. 3 cm right adrenal mass is noted concerning for metastatic disease.  # AUG 2022- PET IMPRESSION: Peripherally hypermetabolic right upper lobe mass, likely due to central necrosis. Mass extends into the right hilum and right lateral chest wall involving the second through fourth right ribs.   Mildly enlarged and hypermetabolic right hilar lymph node.   No evidence of metastatic disease in the abdomen or pelvis.  # AUG, 2022-RIGHT CHEST WALL Bx-necrosis; suspicious for malignancy not definitive [discussed with Dr.Kraynie;MDT] LUNG MASS, RIGHT; BIOPSY:  - SUSPICIOUS FOR MALIGNANCY.  - SEE COMMENT.   Comment:  Approximately six tissue cores demonstrate inflamed fibrous capsule with  hemosiderin-laden macrophages, in a background of abundant necrosis.  One of the cores demonstrates a minute fragment of viable epithelial  cells.  These cells are positive for p40.  They are negative for TTF1  and CK7. The cells of interest disappear on deeper cut levels.  While  the findings are suspicious for squamous cell carcinoma, there is not  enough viable tumor for a definitive diagnosis.  Additional tissue  sampling may be helpful.    # SEP 2022-cycle #2 Taxol hypersensitive reaction.  Switched over to #3 cycle- carbo-Abraxane starting 06/29/2021  # DEC 2022- s/p ID- Dr.ravishankar- ? Lung abscess-no antibiotics-  JAN 2023- CT scan incidental- cecal mass- colonoscopy[Dr.Russo; [facility]- high grade dysplasia]  S/p evaluation with Dr. [name] surgery for now]   # S/p evaluation with Dr. [name].   Primary cancer of right upper lobe of lung ([place])  06/03/2021 Initial Diagnosis   Primary cancer of right upper lobe of lung ([place])   06/03/2021 Cancer Staging   Staging form: Lung, AJCC 8th Edition - Clinical: Stage IIIB (cT3, cN2, cM0) - Signed by [name], MD on 06/03/2021   06/15/2021 - 08/03/2021 Chemotherapy   Patient is on Treatment Plan : LUNG Carboplatin / Paclitaxel + XRT q7d     10/02/2021 - 10/19/2021 Chemotherapy   Patient is on Treatment Plan : LUNG Durvalumab q14d     02/04/2022 -  Chemotherapy   Patient is on Treatment Plan : LUNG NSCLC Pembrolizumab (200) q21d       HISTORY OF PRESENTING ILLNESS: Patient is ambulating independently.  Accompanied by wife.  Rodney Vega 66 y.o.  male history of smoking -"lung cancer" [Limited necrotic tissue]-locally advanced unresectable; and sigmoid colon cancer intact- currently on surveillance is here for follow-up/review of the CT scan.  Patient's [name] has been hold since 03/18/2022 given his necrotic mass in the right lung.   Chronic mild shortness of breath and mild cough is unchanged without fevers or chills. He continues to get IV fluids-twice a week per patient request as he and his wife feel that he feels better following IVF.  Overall patient's appetite and energy is  continuing to improve. He is feeling as if he is stronger than he was previously. Still gets tired and out of energy after he does too much but overall this is improving. Chronic right chest wall pain for which patient is taking Oxycodone 5 mg twice with occasional mid day dose if  needed with good pain control.  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   Denies abdominal pain nausea vomiting.  No blood in stools black or stools.  Wt Readings from Last 3 Encounters:  08/10/22 166 lb 9.6 oz (75.6 kg)  08/05/22 164 lb (74.4 kg)  08/03/22 161 lb (73 kg)    Review of Systems  Constitutional:  Positive for malaise/fatigue and weight loss. Negative for chills, diaphoresis and fever.  HENT:  Negative for nosebleeds and sore throat.   Eyes:  Negative for double vision.  Respiratory:  Positive for cough, sputum production and shortness of breath. Negative for hemoptysis and wheezing.   Cardiovascular:  Positive for chest pain. Negative for palpitations, orthopnea and leg swelling.  Gastrointestinal:  Positive for nausea. Negative for abdominal pain, blood in stool, constipation, diarrhea, heartburn, melena and vomiting.  Genitourinary:  Negative for dysuria, frequency and urgency.  Musculoskeletal:  Negative for back pain and joint pain.  Skin: Negative.  Negative for itching and rash.  Neurological:  Positive for tingling. Negative for dizziness, focal weakness, weakness and headaches.  Endo/Heme/Allergies:  Does not bruise/bleed easily.  Psychiatric/Behavioral:  Negative for depression. The patient is not nervous/anxious and does not have insomnia.      MEDICAL HISTORY:  Past Medical History:  Diagnosis Date   Anemia    Cancer ([place])    COPD (chronic obstructive pulmonary disease) (HCC)    Degenerative arthritis of knee    Depression    Diabetes mellitus without complication ([place])    Diverticulitis    Glaucoma    since 2004   Hernia 1979   History of kidney stones    Hypertension    Neuromuscular disorder ([place])    Personal history of tobacco use, presenting hazards to health    Pneumonia    Pre-diabetes    Screening for obesity    Special screening for malignant neoplasms, colon    Wears dentures    full upper and lower    SURGICAL  HISTORY: Past Surgical History:  Procedure Laterality Date   BACK SURGERY  1994, 2012   lumbar bulging disc   BRONCHOSCOPY  05/05/2022   CATARACT EXTRACTION W/PHACO Left 01/20/2021   Procedure: CATARACT EXTRACTION PHACO AND INTRAOCULAR LENS PLACEMENT ([place]) LEFT;  Surgeon: Birder Robson, MD;  Location: [place];  Service: Ophthalmology;  Laterality: Left;  10.13 0:52.1   COLONOSCOPY  [id number]   [facility]/Dr. Buelah Manis sessile polyp,48m, found in rectum,multiple diverticula were found in the sigmoid, in descending and in transverse colon   COLONOSCOPY WITH PROPOFOL N/A 10/22/2021   Procedure: COLONOSCOPY WITH PROPOFOL;  Surgeon: RAnnamaria Helling DO;  [facility]  Service: Gastroenterology;  Laterality: N/A;  DM   COLOSTOMY  04/20/2013   EYE SURGERY     HERNIA REPAIR  1979   inguinal   IR IMAGING GUIDED PORT INSERTION  06/10/2021   JOINT REPLACEMENT Right    knee   KNEE ARTHROPLASTY Right 05/08/2018   Procedure: COMPUTER ASSISTED TOTAL KNEE ARTHROPLASTY;  Surgeon: HDereck Leep MD;  Location: [facility];  Service: Orthopedics;  Laterality: Right;   KNEE ARTHROPLASTY Left 11/16/2019   Procedure: COMPUTER ASSISTED TOTAL  KNEE ARTHROPLASTY;  Surgeon: Dereck Leep, MD;  Location: [facility];  Service: Orthopedics;  Laterality: Left;   LAPAROTOMY  04/20/2013   colon resection with colosotmy   LITHOTRIPSY  2013   LUNG BIOPSY  2023   TONSILLECTOMY AND ADENOIDECTOMY  1962   VIDEO BRONCHOSCOPY WITH ENDOBRONCHIAL NAVIGATION N/A 05/05/2022   Procedure: VIDEO BRONCHOSCOPY WITH ENDOBRONCHIAL NAVIGATION;  Surgeon: Ottie Glazier, MD;  Location: [facility];  Service: Thoracic;  Laterality: N/A;   VIDEO BRONCHOSCOPY WITH ENDOBRONCHIAL ULTRASOUND N/A 05/05/2022   Procedure: VIDEO BRONCHOSCOPY WITH ENDOBRONCHIAL ULTRASOUND;  Surgeon: Ottie Glazier, MD;  Location: [facility];  Service: Thoracic;  Laterality: N/A;    SOCIAL HISTORY: Social History   Socioeconomic History    Marital status: Married    Spouse name: Pegge   Number of children: Not on file   Years of education: Not on file   Highest education level: Not on file  Occupational History   Not on file  Tobacco Use   Smoking status: Every Day    Packs/day: 1.00    Years: 30.00    Total pack years: 30.00    Types: Cigarettes   Smokeless tobacco: Never   Tobacco comments:    since age 78 or 72  Vaping Use   Vaping Use: Never used  Substance and Sexual Activity   Alcohol use: Not Currently    Alcohol/week: 2.0 standard drinks of alcohol    Types: 2 Standard drinks or equivalent per week    Comment: 1-2 drinks per week   Drug use: Yes    Types: Marijuana    Comment: 2-3 week   Sexual activity: Not on file  Other Topics Concern   Not on file  Social History Narrative   15 mins Andrew; smoking; not much alcohol. Worked for [employer], 2019. Marland Kitchen    Social Determinants of Health   Financial Resource Strain: Low Risk  (08/10/2022)   Overall Financial Resource Strain (CARDIA)    Difficulty of Paying Living Expenses: Not very hard  Food Insecurity: No Food Insecurity (08/10/2022)   Hunger Vital Sign    Worried About Running Out of Food in the Last Year: Never true    Ran Out of Food in the Last Year: Never true  Transportation Needs: Not on file  Physical Activity: Not on file  Stress: No Stress Concern Present (08/10/2022)   [employer] [facility] [place]    Feeling of Stress : Not at all  Social Connections: Unknown (08/10/2022)   Social Connection and Isolation Panel [NHANES]    Frequency of Communication with Friends and Family: Three times a week    Frequency of Social Gatherings with Friends and Family: Twice a week    Attends Religious Services: Patient refused    Marine scientist or Organizations: No    Attends Archivist Meetings: Never    Marital Status: Married  Human resources officer Violence: Not At  Risk (08/10/2022)   Humiliation, Afraid, Rape, and Kick questionnaire    Fear of Current or Ex-Partner: No    Emotionally Abused: No    Physically Abused: No    Sexually Abused: No    FAMILY HISTORY: Family History  Problem Relation Age of Onset   Diabetes Mother    Heart disease Mother    Heart attack Father    Prostate cancer Father     ALLERGIES:  is allergic to paclitaxel, ambien [zolpidem], nsaids, and aleve [naproxen sodium].  MEDICATIONS:  Current Outpatient Medications  Medication Sig Dispense Refill   Accu-Chek Softclix Lancets lancets USE AS DIRECTED FOUR TIMES DAILY 100 each 12   acetaminophen (TYLENOL) 500 MG tablet Take 2 tablets (1,000 mg total) by mouth daily as needed. Home med. 30 tablet 0   albuterol (PROVENTIL) (2.5 MG/3ML) 0.083% nebulizer solution Take 2.5 mg by nebulization every 4 (four) hours as needed for wheezing or shortness of breath.     albuterol (VENTOLIN HFA) 108 (90 Base) MCG/ACT inhaler SMARTSIG:2 inhalation Via Inhaler Every 6 Hours PRN 1 each 2   blood glucose meter kit and supplies KIT Check your blood glucose levels twice a day; once in the morning before breakfast; and once in the evening after dinner. 1 each 0   BREZTRI AEROSPHERE 160-9-4.8 MCG/ACT AERO Inhale 2 puffs into the lungs 2 (two) times daily.     cetirizine (ZYRTEC) 10 MG tablet Take 10 mg by mouth daily.     clobetasol cream (TEMOVATE) 5.18 % Apply 1 Application topically 2 (two) times daily. 30 g 2   dorzolamide-timolol (COSOPT) 22.3-6.8 MG/ML ophthalmic solution Place 1 drop into both eyes 2 (two) times daily.     ergocalciferol (VITAMIN D2) 1.25 MG (50000 UT) capsule Take 1 capsule (50,000 Units total) by mouth once a week. 12 capsule 1   eszopiclone (LUNESTA) 1 MG TABS tablet TAKE 1 TABLET(1 MG) BY MOUTH AT BEDTIME AS NEEDED FOR SLEEP 30 tablet 1   glipiZIDE (GLUCOTROL) 5 MG tablet TAKE 1 TABLET(5 MG) BY MOUTH DAILY BEFORE BREAKFAST 90 tablet 2   glucose blood test strip Check  your blood glucose levels twice a day; once in the morning before breakfast; and once in the evening after dinner. 100 each 12   ipratropium-albuterol (DUONEB) 0.5-2.5 (3) MG/3ML SOLN Take 3 mLs by nebulization every 6 (six) hours as needed. 360 mL 1   latanoprost (XALATAN) 0.005 % ophthalmic solution Place 1 drop into both eyes at bedtime.     lidocaine-prilocaine (EMLA) cream Apply 1 application. topically as needed. 30 g 3   metFORMIN (GLUCOPHAGE) 500 MG tablet Take 1 tablet (500 mg total) by mouth 2 (two) times daily with a meal. 60 tablet 2   montelukast (SINGULAIR) 10 MG tablet TAKE 1 TABLET(10 MG) BY MOUTH AT BEDTIME 90 tablet 0   Multiple Vitamin (MULTIVITAMIN WITH MINERALS) TABS tablet Take 1 tablet by mouth daily.     olmesartan (BENICAR) 40 MG tablet Take 1 tablet (40 mg total) by mouth daily. 90 tablet 1   oxyCODONE (OXY IR/ROXICODONE) 5 MG immediate release tablet Take 1 tablet (5 mg total) by mouth every 8 (eight) hours as needed for severe pain. 90 tablet 0   pregabalin (LYRICA) 50 MG capsule Take 1 capsule (50 mg total) by mouth 2 (two) times daily. 60 capsule 0   propranolol ER (INDERAL LA) 60 MG 24 hr capsule TAKE 1 CAPSULE(60 MG) BY MOUTH DAILY 30 capsule 3   Respiratory Therapy Supplies (NEBULIZER) DEVI Use as directed 1 each 0   tamsulosin (FLOMAX) 0.4 MG CAPS capsule Take 0.4 mg by mouth daily.     triamcinolone ointment (KENALOG) 0.5 % Apply 1 Application topically 2 (two) times daily. 30 g 0   venlafaxine XR (EFFEXOR-XR) 75 MG 24 hr capsule Take 225 mg by mouth daily.     vitamin B-12 (CYANOCOBALAMIN) 500 MCG tablet Take 1 tablet by mouth daily.     dextromethorphan-guaiFENesin (MUCINEX DM) 30-600 MG 12hr tablet Take 1 tablet by mouth  2 (two) times daily as needed for cough. (Patient not taking: Reported on 06/15/2022)     fentaNYL (DURAGESIC) 25 MCG/HR Place 1 patch onto the skin every 3 (three) days. (Patient not taking: Reported on 06/15/2022) 10 patch 0    guaiFENesin-codeine 100-10 MG/5ML syrup Take 5 mLs by mouth daily as needed for cough. (Patient not taking: Reported on 06/15/2022) 120 mL 0   Hydrocod Polst-Chlorphen Polst (CHLORPHENIRAMINE-HYDROCODONE) 10-8 MG/5ML Take 5 mLs by mouth at bedtime as needed for cough. (Patient not taking: Reported on 08/10/2022) 140 mL 0   Pembrolizumab (KEYTRUDA IV) Inject into the vein. Once every 3 weeks, receives at Infusion center     potassium chloride SA (KLOR-CON M) 20 MEQ tablet Take 1 tablet (20 mEq total) by mouth daily. 30 tablet 1   prochlorperazine (COMPAZINE) 10 MG tablet Take 10 mg by mouth every 6 (six) hours as needed for nausea or vomiting. (Patient not taking: Reported on 08/10/2022)     Current Facility-Administered Medications  Medication Dose Route Frequency Provider Last Rate Last Admin   sodium chloride flush (NS) 0.9 % injection 10 mL  10 mL Intravenous Once Hue Frick M, PA-C       Facility-Administered Medications Ordered in Other Visits  Medication Dose Route Frequency Provider Last Rate Last Admin   0.9 %  sodium chloride infusion   Intravenous Continuous Hughie Closs, PA-C 999 mL/hr at 08/10/22 1138 New Bag at 08/10/22 1138   heparin lock flush 100 UNIT/ML injection            heparin lock flush 100 UNIT/ML injection            heparin lock flush 100 UNIT/ML injection               .  PHYSICAL EXAMINATION: ECOG PERFORMANCE STATUS: 1 - Symptomatic but completely ambulatory  Vitals:   08/10/22 1037  BP: 134/83  Pulse: 88  Resp: 20  Temp: (!) 96.9 F (36.1 C)    Physical Exam Vitals and nursing note reviewed.  HENT:     Head: Normocephalic and atraumatic.     Comments: Mild proptosis of the right eye. Possible scant decreased ROM of the right superior oblique muscle    Mouth/Throat:     Pharynx: Oropharynx is clear.  Eyes:     Extraocular Movements: Extraocular movements intact.     Pupils: Pupils are equal, round, and reactive to light.  Cardiovascular:      Rate and Rhythm: Normal rate and regular rhythm.  Pulmonary:     Comments: Decreased breath sounds bilaterally.  Abdominal:     Palpations: Abdomen is soft.  Musculoskeletal:        General: Normal range of motion.     Cervical back: Normal range of motion.  Skin:    General: Skin is warm.  Neurological:     General: No focal deficit present.     Mental Status: He is alert and oriented to person, place, and time.  Psychiatric:        Behavior: Behavior normal.        Judgment: Judgment normal.     LABORATORY DATA:  I have reviewed the data as listed Lab Results  Component Value Date   WBC 7.6 08/10/2022   HGB 11.5 (L) 08/10/2022   HCT 35.8 (L) 08/10/2022   MCV 88.2 08/10/2022   PLT 217 08/10/2022   Recent Labs    06/15/22 1344 07/13/22 1022 08/10/22 1034  NA 139 139 138  K 3.6 3.2* 3.3*  CL 105 103 103  CO2 _0 GLUCOSE 93 224* 201*  BUN 7* 9 9  CREATININE 0.87 0.88 0.97  CALCIUM 8.5* 8.6* 8.7*  GFRNONAA >60 >60 >60  PROT 6.7 6.8 6.8  ALBUMIN 3.2* 3.4* 3.5  AST _1 ALT _2 ALKPHOS 89 95 92  BILITOT 0.4 0.4 0.4    RADIOGRAPHIC STUDIES: I have personally reviewed the radiological images as listed and agreed with the findings in the report. DG Chest 2 View  Result Date: 07/16/2022 CLINICAL DATA:  Lung cancer with cough and congestion. EXAM: CHEST - 2 VIEW COMPARISON:  May 05, 2022 FINDINGS: The heart size and mediastinal contours are stable. Left central venous line is identified unchanged. Area of cavitation is identified in the right upper thorax with surrounding extensive airspace disease unchanged compared prior exam. The left lung is clear. There is no pleural effusion. The visualized skeletal structures are stable. IMPRESSION: Area of cavitation is identified in the right upper thorax with surrounding extensive airspace disease unchanged compared prior exam. Electronically Signed   By: Abelardo Diesel M.D.   On: 07/16/2022 15:49     ASSESSMENT & PLAN:  Encounter Diagnoses  Name Primary?   Primary cancer of right upper lobe of lung ([place]) Yes   Port-A-Cath in place    Hypokalemia    Hypoglycemia    Progressive focal motor weakness    Cancer associated pain    Proptosis    Visual changes    Primary cancer of right upper lobe of lung ([place]) # UNRESECTABLE/LOCALLY ADVANCED- Right upper lobe lung mass -concerning for malignancy [biopsy inconclusive/atypical cells]-  April 2023- PD-L1 positive [cirucologene].   May 05, 2022-s/p bronchoscopy- with Dr.Aleskerov-significant purulent/necrotic debris noted.  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 There is some clearing of some of the right lower lobe airspace opacity and volume loss compared to the prior exam.  Chronic rib destruction noted fourth right and third rib-?  Radiation versus malignancy.  Prominent stable hilar/mediastinal lymph nodes.  Overall malignancy appears stable.    TODAY: #Recommend continued surveillance without any progression of lung malignancy-especially given the concern for chronic/active infection/inflammation. Last [name] was in May-June 2023. Continue surveillance given the overall stability of disease but will order the MR brain to evaluate for the apparent proptosis.     #Right chest wall pain-/pleuritic-rib destruction malignancy versus radiation- Chronic and stable. Was on fenatny patch 25 mcg however he reports that he has not had to be used this in many months. Discussed that he should contact palliative care before restarting this.  Continue his Oxycodone 5 mg every 8 to 12 hours hours as needed overall.     # COPD/fatigue- [coughing/phlegm/ no fevers]- on albuterol/advair [smoking]- TSH- WNL;Continue  Xopenex/ipratropium nebs-; Mucinex- DM BID.  Stable   # Cecal  mass ~ 3 cm s/p colonoscopy-cecal mass clinically suggestive of malignancy-biopsy high-grade dysplasia;s/p evaluation Dr. Bary Castilla.  CT OCt 5th, 2023-Filling defect in the cecum near the appendiceal orifice measuring about 3.4 by 2.9 cm, concerning for mass, as noted on prior exam. There is also abnormal dilation of the appendix up to 1.1 cm in diameter, similar to previous.  Patient not too keen on any surgical interventions at this time. No changes to our plan today. Continue monitoring.    #  Poorly controlled diabetes-blood sugars- better recently;  Continue glipizide; and  Metformin. STABLE.   # IV mediport: functioning/STABLE  # Hypokalemia: Has had mild hypokalemia for the past two lab draws. Supplementing today while he gets IVF. Tomorrow he will restart his oral K supplement.   # Proptosis- Found on examination today. Does not appear abruptly acute. Concerning especially given his lung cancer diagnosis. MR discussed and agreeable by patient. Order placed.    Disposition:  1 L IVF today, 20 MEQ k Restarting home potassium tomorrow MR brain w/wo within 2 weeks # around 08/12/2022 IVF 1 hour # in 1 week- IVFs-1 lit /1 hourTuesdays/Thursday   # in 2 weeks-  IVFs-1 lit /1 hourTuesdays/Thursday  # in 3 weeks-  IVFs-1 lit /1 hourTuesdays/Thursday  RTC 1 month MD, labs (CMP, CBC), fluids. -[place]  All questions were answered. The patient knows to call the clinic with any problems, questions or concerns.    Hughie Closs, PA-C 08/10/2022 12:50 PM

## 2022-08-10 NOTE — Patient Instructions (Signed)
Pick up potassium prescription at your pharmacy. Do not start taking your potassium prescription until tomorrow.  We administered potassium 20 meq in your iv line today through your port.

## 2022-08-11 MED FILL — Dexamethasone Sodium Phosphate Inj 100 MG/10ML: INTRAMUSCULAR | Qty: 1 | Status: AC

## 2022-08-12 ENCOUNTER — Inpatient Hospital Stay: Payer: Medicare PPO

## 2022-08-12 VITALS — BP 138/82 | HR 91 | Temp 98.0°F | Resp 20 | Wt 162.1 lb

## 2022-08-12 DIAGNOSIS — C3411 Malignant neoplasm of upper lobe, right bronchus or lung: Secondary | ICD-10-CM | POA: Diagnosis not present

## 2022-08-12 MED ORDER — HEPARIN SOD (PORK) LOCK FLUSH 100 UNIT/ML IV SOLN
500.0000 [IU] | Freq: Once | INTRAVENOUS | Status: AC | PRN
  Administered 2022-08-12: 500 [IU]
  Filled 2022-08-12: qty 5

## 2022-08-12 MED ORDER — SODIUM CHLORIDE 0.9 % IV SOLN
Freq: Once | INTRAVENOUS | Status: AC
  Filled 2022-08-12: qty 250

## 2022-08-12 MED ORDER — SODIUM CHLORIDE 0.9% FLUSH
10.0000 mL | Freq: Once | INTRAVENOUS | Status: AC | PRN
  Administered 2022-08-12: 10 mL
  Filled 2022-08-12: qty 10

## 2022-08-16 ENCOUNTER — Ambulatory Visit: Payer: Medicare PPO | Attending: Radiation Oncology | Admitting: Radiation Oncology

## 2022-08-16 MED FILL — Dexamethasone Sodium Phosphate Inj 100 MG/10ML: INTRAMUSCULAR | Qty: 1 | Status: AC

## 2022-08-17 ENCOUNTER — Inpatient Hospital Stay: Payer: Medicare PPO

## 2022-08-17 VITALS — BP 155/81 | HR 91 | Temp 96.0°F | Wt 161.7 lb

## 2022-08-17 DIAGNOSIS — C3411 Malignant neoplasm of upper lobe, right bronchus or lung: Secondary | ICD-10-CM | POA: Diagnosis not present

## 2022-08-17 MED ORDER — SODIUM CHLORIDE 0.9 % IV SOLN
Freq: Once | INTRAVENOUS | Status: AC
  Filled 2022-08-17: qty 250

## 2022-08-17 MED ORDER — HEPARIN SOD (PORK) LOCK FLUSH 100 UNIT/ML IV SOLN
500.0000 [IU] | Freq: Once | INTRAVENOUS | Status: DC | PRN
  Filled 2022-08-17: qty 5

## 2022-08-18 MED FILL — Dexamethasone Sodium Phosphate Inj 100 MG/10ML: INTRAMUSCULAR | Qty: 1 | Status: AC

## 2022-08-19 ENCOUNTER — Inpatient Hospital Stay: Payer: Medicare PPO

## 2022-08-19 ENCOUNTER — Ambulatory Visit
Admission: RE | Admit: 2022-08-19 | Discharge: 2022-08-19 | Disposition: A | Discharge status: Home / Self Care | Payer: Medicare PPO | Source: Ambulatory Visit | Attending: Medical Oncology | Admitting: Medical Oncology

## 2022-08-19 VITALS — BP 126/79 | HR 97 | Temp 97.4°F | Resp 16 | Wt 162.0 lb

## 2022-08-19 DIAGNOSIS — H539 Unspecified visual disturbance: Secondary | ICD-10-CM | POA: Diagnosis present

## 2022-08-19 DIAGNOSIS — C3411 Malignant neoplasm of upper lobe, right bronchus or lung: Secondary | ICD-10-CM

## 2022-08-19 DIAGNOSIS — H0520 Unspecified exophthalmos: Secondary | ICD-10-CM | POA: Diagnosis present

## 2022-08-19 MED ORDER — GADOBUTROL 1 MMOL/ML IV SOLN
7.0000 mL | Freq: Once | INTRAVENOUS | Status: AC | PRN
  Administered 2022-08-19: 7 mL via INTRAVENOUS

## 2022-08-19 MED ORDER — SODIUM CHLORIDE 0.9% FLUSH
10.0000 mL | Freq: Once | INTRAVENOUS | Status: AC | PRN
  Administered 2022-08-19: 10 mL
  Filled 2022-08-19: qty 10

## 2022-08-19 MED ORDER — SODIUM CHLORIDE 0.9 % IV SOLN
Freq: Once | INTRAVENOUS | Status: AC
  Filled 2022-08-19: qty 250

## 2022-08-19 MED ORDER — HEPARIN SOD (PORK) LOCK FLUSH 100 UNIT/ML IV SOLN
500.0000 [IU] | Freq: Once | INTRAVENOUS | Status: AC | PRN
  Administered 2022-08-19: 500 [IU]
  Filled 2022-08-19: qty 5

## 2022-08-23 ENCOUNTER — Inpatient Hospital Stay: Payer: Medicare PPO

## 2022-08-23 VITALS — BP 152/76

## 2022-08-23 DIAGNOSIS — C3411 Malignant neoplasm of upper lobe, right bronchus or lung: Secondary | ICD-10-CM

## 2022-08-23 MED ORDER — HEPARIN SOD (PORK) LOCK FLUSH 100 UNIT/ML IV SOLN
500.0000 [IU] | Freq: Once | INTRAVENOUS | Status: AC | PRN
  Administered 2022-08-23: 500 [IU]
  Filled 2022-08-23: qty 5

## 2022-08-23 MED ORDER — SODIUM CHLORIDE 0.9 % IV SOLN
Freq: Once | INTRAVENOUS | Status: AC
  Filled 2022-08-23: qty 250

## 2022-08-23 MED ORDER — SODIUM CHLORIDE 0.9% FLUSH
10.0000 mL | Freq: Once | INTRAVENOUS | Status: AC | PRN
  Administered 2022-08-23: 10 mL
  Filled 2022-08-23: qty 10

## 2022-08-25 ENCOUNTER — Inpatient Hospital Stay (HOSPITAL_BASED_OUTPATIENT_CLINIC_OR_DEPARTMENT_OTHER): Payer: Medicare PPO | Admitting: Medical Oncology

## 2022-08-25 ENCOUNTER — Inpatient Hospital Stay: Payer: Medicare PPO

## 2022-08-25 ENCOUNTER — Ambulatory Visit
Admission: RE | Admit: 2022-08-25 | Discharge: 2022-08-25 | Disposition: A | Discharge status: Home / Self Care | Payer: Medicare PPO | Attending: Medical Oncology | Admitting: Medical Oncology

## 2022-08-25 ENCOUNTER — Encounter: Payer: Self-pay | Admitting: Medical Oncology

## 2022-08-25 ENCOUNTER — Ambulatory Visit
Admission: RE | Admit: 2022-08-25 | Discharge: 2022-08-25 | Disposition: A | Discharge status: Home / Self Care | Payer: Medicare PPO | Source: Ambulatory Visit | Attending: Medical Oncology | Admitting: Medical Oncology

## 2022-08-25 VITALS — BP 122/61 | HR 81 | Temp 97.4°F | Resp 20

## 2022-08-25 DIAGNOSIS — R0781 Pleurodynia: Secondary | ICD-10-CM | POA: Diagnosis present

## 2022-08-25 DIAGNOSIS — R051 Acute cough: Secondary | ICD-10-CM

## 2022-08-25 DIAGNOSIS — C3411 Malignant neoplasm of upper lobe, right bronchus or lung: Secondary | ICD-10-CM

## 2022-08-25 MED ORDER — SODIUM CHLORIDE 0.9 % IV SOLN
Freq: Once | INTRAVENOUS | Status: AC
  Filled 2022-08-25: qty 250

## 2022-08-25 MED ORDER — HEPARIN SOD (PORK) LOCK FLUSH 100 UNIT/ML IV SOLN
500.0000 [IU] | Freq: Once | INTRAVENOUS | Status: AC | PRN
  Administered 2022-08-25: 500 [IU]
  Filled 2022-08-25: qty 5

## 2022-08-25 MED ORDER — SODIUM CHLORIDE 0.9% FLUSH
10.0000 mL | Freq: Once | INTRAVENOUS | Status: AC | PRN
  Administered 2022-08-25: 10 mL
  Filled 2022-08-25: qty 10

## 2022-08-25 NOTE — Progress Notes (Signed)
Pt reports increased pain in right rib cage/breast area at night over the past couple of days.Can feel it more when he takes a deep breath. Currently he states it as 3/10 pain but rates it as 7-8/10 when it is really hurting, pt describes as it like it "feels like a cracked rib". Takes his oxycodone pill and it helps, past 3-4 days has had to take an extra pill for pain control

## 2022-08-25 NOTE — Progress Notes (Signed)
Symptom Management [name] at [facility] Telephone:([phone]) [phone] [phone]) [phone]  Patient Care Team: [name], MD as PCP - General (Family Medicine) [name], RN as Oncology Nurse Navigator [name], MD as Consulting Physician (Urology) [name], MD as Consulting Physician (Oncology) Ottie Glazier, MD as Consulting Physician (Pulmonary Disease)   Name of the patient: Rodney Vega  [id number]  [birth year]   Date of visit: 08/25/22  Reason for Consult: Rodney Vega is a 66 y.o. male with history of right upper lobe lung cancer currently on surveillance who presents today for:  Right rib pain: Started having a cough a few weeks ago. Worsened so he called his PCP. They sent in Augmentin and decadron for him to take. Has not started it yet. Cough may be slightly better today. Last night started having right rib pain. Hurts when he coughs or takes a deep breath. Feels like a cracked rib. No injury. Has tried his rx pain medications with relief of symptom 7/10 down to 1/10. No fevers, new SOB, hemoptysis   Wt Readings from Last 3 Encounters:  08/19/22 162 lb (73.5 kg)  08/17/22 161 lb 11.2 oz (73.3 kg)  08/12/22 162 lb 1.6 oz (73.5 kg)     PAST MEDICAL HISTORY: Past Medical History:  Diagnosis Date   Anemia    Cancer ([place])    COPD (chronic obstructive pulmonary disease) ([place])    Degenerative arthritis of knee    Depression    Diabetes mellitus without complication ([place])    Diverticulitis    Glaucoma    since 2004   Hernia 1979   History of kidney stones    Hypertension    Neuromuscular disorder ([place])    Personal history of tobacco use, presenting hazards to health    Pneumonia    Pre-diabetes    Screening for obesity    Special screening for malignant neoplasms, colon    Wears dentures    full upper and lower    PAST SURGICAL HISTORY:  Past Surgical History:  Procedure Laterality Date   [place], 2012   lumbar bulging disc   BRONCHOSCOPY  05/05/2022   CATARACT EXTRACTION W/PHACO Left 01/20/2021   Procedure: CATARACT EXTRACTION PHACO AND INTRAOCULAR LENS PLACEMENT ([place]) LEFT;  Surgeon: Birder Robson, MD;  Location: [facility];  Service: Ophthalmology;  Laterality: Left;  10.13 0:52.1   COLONOSCOPY  [id number]   [facility]/Dr. Buelah Manis sessile polyp,76m, found in rectum,multiple diverticula were found in the sigmoid, in descending and in transverse colon   COLONOSCOPY WITH PROPOFOL N/A 10/22/2021   Procedure: COLONOSCOPY WITH PROPOFOL;  Surgeon: RAnnamaria Helling DO;  [facility];  Service: Gastroenterology;  Laterality: N/A;  DM   COLOSTOMY  04/20/2013   EYE SURGERY     HERNIA REPAIR  1979   inguinal   IR IMAGING GUIDED PORT INSERTION  06/10/2021   JOINT REPLACEMENT Right    knee   KNEE ARTHROPLASTY Right 05/08/2018   Procedure: COMPUTER ASSISTED TOTAL KNEE ARTHROPLASTY;  Surgeon: HDereck Leep MD;  Location: [facility];  Service: Orthopedics;  Laterality: Right;   KNEE ARTHROPLASTY Left 11/16/2019   Procedure: COMPUTER ASSISTED TOTAL KNEE ARTHROPLASTY;  Surgeon: HDereck Leep MD;  Location: [facility];  Service: Orthopedics;  Laterality: Left;   LAPAROTOMY  04/20/2013   colon resection with colosotmy   LITHOTRIPSY  2013   LUNG BIOPSY  2023   TONSILLECTOMY AND ADENOIDECTOMY  1962   VIDEO  BRONCHOSCOPY WITH ENDOBRONCHIAL NAVIGATION N/A 05/05/2022   Procedure: VIDEO BRONCHOSCOPY WITH ENDOBRONCHIAL NAVIGATION;  Surgeon: Ottie Glazier, MD;  Location: [facility];  Service: Thoracic;  Laterality: N/A;   VIDEO BRONCHOSCOPY WITH ENDOBRONCHIAL ULTRASOUND N/A 05/05/2022   Procedure: VIDEO BRONCHOSCOPY WITH ENDOBRONCHIAL ULTRASOUND;  Surgeon: Ottie Glazier, MD;  Location: [facility];  Service: Thoracic;  Laterality: N/A;    HEMATOLOGY/ONCOLOGY HISTORY:  Oncology History Overview Note  AUG 2022- 8.2 x 5.9 cm spiculated mass noted in right upper lobe  consistent with malignancy. It extends from the right hilum to the lateral chest wall and results in lytic destruction of the lateral portion of the right fourth rib.  Mediastinal lymph nodes are noted, including 12 mm right hilar lymph node, consistent with metastatic disease. 3 cm right adrenal mass is noted concerning for metastatic disease.  # AUG 2022- PET IMPRESSION: Peripherally hypermetabolic right upper lobe mass, likely due to central necrosis. Mass extends into the right hilum and right lateral chest wall involving the second through fourth right ribs.   Mildly enlarged and hypermetabolic right hilar lymph node.   No evidence of metastatic disease in the abdomen or pelvis.  # AUG, 2022-RIGHT CHEST WALL Bx-necrosis; suspicious for malignancy not definitive [discussed with Dr.Kraynie;MDT] LUNG MASS, RIGHT; BIOPSY:  - SUSPICIOUS FOR MALIGNANCY.  - SEE COMMENT.   Comment:  Approximately six tissue cores demonstrate inflamed fibrous capsule with  hemosiderin-laden macrophages, in a background of abundant necrosis.  One of the cores demonstrates a minute fragment of viable epithelial  cells.  These cells are positive for p40.  They are negative for TTF1  and CK7. The cells of interest disappear on deeper cut levels.  While  the findings are suspicious for squamous cell carcinoma, there is not  enough viable tumor for a definitive diagnosis.  Additional tissue  sampling may be helpful.   # SEP 2022-cycle #2 Taxol hypersensitive reaction.  Switched over to #3 cycle- carbo-Abraxane starting 06/29/2021  # DEC 2022- s/p ID- Dr.ravishankar- ? Lung abscess-no antibiotics-  JAN 2023- CT scan incidental- cecal mass- colonoscopy[Dr.Russo; [facility]- high grade dysplasia]  S/p evaluation with Dr. [name] surgery for now]   # S/p evaluation with Dr. [name].   Primary cancer of right upper lobe of lung ([place])  06/03/2021 Initial Diagnosis   Primary cancer of right upper lobe of lung  ([place])   06/03/2021 Cancer Staging   Staging form: Lung, AJCC 8th Edition - Clinical: Stage IIIB (cT3, cN2, cM0) - Signed by [name], MD on 06/03/2021   06/15/2021 - 08/03/2021 Chemotherapy   Patient is on Treatment Plan : LUNG Carboplatin / Paclitaxel + XRT q7d     10/02/2021 - 10/19/2021 Chemotherapy   Patient is on Treatment Plan : LUNG Durvalumab q14d     02/04/2022 -  Chemotherapy   Patient is on Treatment Plan : LUNG NSCLC Pembrolizumab (200) q21d       ALLERGIES:  is allergic to paclitaxel, ambien [zolpidem], nsaids, and aleve [naproxen sodium].  MEDICATIONS:  Current Outpatient Medications  Medication Sig Dispense Refill   Accu-Chek Softclix Lancets lancets USE AS DIRECTED FOUR TIMES DAILY 100 each 12   acetaminophen (TYLENOL) 500 MG tablet Take 2 tablets (1,000 mg total) by mouth daily as needed. Home med. 30 tablet 0   albuterol (PROVENTIL) (2.5 MG/3ML) 0.083% nebulizer solution Take 2.5 mg by nebulization every 4 (four) hours as needed for wheezing or shortness of breath.     albuterol (VENTOLIN HFA) 108 (90 Base) MCG/ACT  inhaler SMARTSIG:2 inhalation Via Inhaler Every 6 Hours PRN 1 each 2   amoxicillin-clavulanate (AUGMENTIN) 875-125 MG tablet Take 1 tablet by mouth 2 (two) times daily.     blood glucose meter kit and supplies KIT Check your blood glucose levels twice a day; once in the morning before breakfast; and once in the evening after dinner. 1 each 0   BREZTRI AEROSPHERE 160-9-4.8 MCG/ACT AERO Inhale 2 puffs into the lungs 2 (two) times daily.     cetirizine (ZYRTEC) 10 MG tablet Take 10 mg by mouth daily.     clobetasol cream (TEMOVATE) 1.24 % Apply 1 Application topically 2 (two) times daily. 30 g 2   dexamethasone (DECADRON) 4 MG tablet Take 4 mg by mouth daily.     dextromethorphan-guaiFENesin (MUCINEX DM) 30-600 MG 12hr tablet Take 1 tablet by mouth 2 (two) times daily as needed for cough.     dorzolamide-timolol (COSOPT) 22.3-6.8 MG/ML ophthalmic  solution Place 1 drop into both eyes 2 (two) times daily.     ergocalciferol (VITAMIN D2) 1.25 MG (50000 UT) capsule Take 1 capsule (50,000 Units total) by mouth once a week. 12 capsule 1   eszopiclone (LUNESTA) 1 MG TABS tablet TAKE 1 TABLET(1 MG) BY MOUTH AT BEDTIME AS NEEDED FOR SLEEP 30 tablet 1   fentaNYL (DURAGESIC) 25 MCG/HR Place 1 patch onto the skin every 3 (three) days. 10 patch 0   glipiZIDE (GLUCOTROL) 5 MG tablet TAKE 1 TABLET(5 MG) BY MOUTH DAILY BEFORE BREAKFAST 90 tablet 2   glucose blood test strip Check your blood glucose levels twice a day; once in the morning before breakfast; and once in the evening after dinner. 100 each 12   guaiFENesin-codeine 100-10 MG/5ML syrup Take 5 mLs by mouth daily as needed for cough. 120 mL 0   Hydrocod Polst-Chlorphen Polst (CHLORPHENIRAMINE-HYDROCODONE) 10-8 MG/5ML Take 5 mLs by mouth at bedtime as needed for cough. 140 mL 0   ipratropium-albuterol (DUONEB) 0.5-2.5 (3) MG/3ML SOLN Take 3 mLs by nebulization every 6 (six) hours as needed. 360 mL 1   latanoprost (XALATAN) 0.005 % ophthalmic solution Place 1 drop into both eyes at bedtime.     lidocaine-prilocaine (EMLA) cream Apply 1 application. topically as needed. 30 g 3   metFORMIN (GLUCOPHAGE) 500 MG tablet Take 1 tablet (500 mg total) by mouth 2 (two) times daily with a meal. 60 tablet 2   montelukast (SINGULAIR) 10 MG tablet TAKE 1 TABLET(10 MG) BY MOUTH AT BEDTIME 90 tablet 0   Multiple Vitamin (MULTIVITAMIN WITH MINERALS) TABS tablet Take 1 tablet by mouth daily.     olmesartan (BENICAR) 40 MG tablet Take 1 tablet (40 mg total) by mouth daily. 90 tablet 1   oxyCODONE (OXY IR/ROXICODONE) 5 MG immediate release tablet Take 1 tablet (5 mg total) by mouth every 8 (eight) hours as needed for severe pain. 90 tablet 0   potassium chloride SA (KLOR-CON M) 20 MEQ tablet Take 1 tablet (20 mEq total) by mouth daily. 30 tablet 1   pregabalin (LYRICA) 50 MG capsule Take 1 capsule (50 mg total) by mouth  2 (two) times daily. 60 capsule 0   prochlorperazine (COMPAZINE) 10 MG tablet Take 10 mg by mouth every 6 (six) hours as needed for nausea or vomiting.     propranolol ER (INDERAL LA) 60 MG 24 hr capsule TAKE 1 CAPSULE(60 MG) BY MOUTH DAILY 30 capsule 3   Respiratory Therapy Supplies (NEBULIZER) DEVI Use as directed 1 each 0   tamsulosin (  FLOMAX) 0.4 MG CAPS capsule Take 0.4 mg by mouth daily.     triamcinolone ointment (KENALOG) 0.5 % Apply 1 Application topically 2 (two) times daily. 30 g 0   venlafaxine XR (EFFEXOR-XR) 75 MG 24 hr capsule Take 225 mg by mouth daily.     vitamin B-12 (CYANOCOBALAMIN) 500 MCG tablet Take 1 tablet by mouth daily.     No current facility-administered medications for this visit.   Facility-Administered Medications Ordered in Other Visits  Medication Dose Route Frequency Provider Last Rate Last Admin   heparin lock flush 100 UNIT/ML injection            heparin lock flush 100 UNIT/ML injection            heparin lock flush 100 UNIT/ML injection             VITAL SIGNS: BP 122/61   Pulse 81   Temp (!) 97.4 F (36.3 C) (Tympanic)   Resp 20  There were no vitals filed for this visit.  Estimated body mass index is 21.97 kg/m as calculated from the following:   Height as of 08/10/22: 6' (1.829 m).   Weight as of 08/19/22: 162 lb (73.5 kg).  LABS: CBC:    Component Value Date/Time   WBC 7.6 08/10/2022 1034   HGB 11.5 (L) 08/10/2022 1034   HGB 7.5 (L) 12/01/2021 0504   HCT 35.8 (L) 08/10/2022 1034   HCT 23.7 (L) 12/01/2021 0504   PLT 217 08/10/2022 1034   PLT 243 12/01/2021 0504   MCV 88.2 08/10/2022 1034   MCV 89 12/01/2021 0504   MCV 91 09/21/2013 0522   NEUTROABS 5.2 08/10/2022 1034   NEUTROABS 9.6 (H) 12/01/2021 0504   NEUTROABS 11.3 (H) 09/21/2013 0522   LYMPHSABS 1.2 08/10/2022 1034   LYMPHSABS 0.8 12/01/2021 0504   LYMPHSABS 1.4 09/21/2013 0522   MONOABS 0.5 08/10/2022 1034   MONOABS 0.9 09/21/2013 0522   EOSABS 0.6 (H) 08/10/2022 1034    EOSABS 0.3 12/01/2021 0504   EOSABS 0.0 09/21/2013 0522   BASOSABS 0.1 08/10/2022 1034   BASOSABS 0.0 12/01/2021 0504   BASOSABS 0.1 09/21/2013 0522   Comprehensive Metabolic Panel:    Component Value Date/Time   NA 138 08/10/2022 1034   NA 138 09/20/2013 0639   K 3.3 (L) 08/10/2022 1034   K 4.0 09/20/2013 0639   CL 103 08/10/2022 1034   CL 107 09/20/2013 0639   CO2 26 08/10/2022 1034   CO2 26 09/20/2013 0639   BUN 9 08/10/2022 1034   BUN 12 09/20/2013 0639   CREATININE 0.97 08/10/2022 1034   CREATININE 0.84 09/24/2013 0515   GLUCOSE 201 (H) 08/10/2022 1034   GLUCOSE 160 (H) 09/20/2013 0639   CALCIUM 8.7 (L) 08/10/2022 1034   CALCIUM 8.2 (L) 09/20/2013 0639   AST 22 08/10/2022 1034   AST 22 01/08/2012 0939   ALT 8 08/10/2022 1034   ALT 20 01/08/2012 0939   ALKPHOS 92 08/10/2022 1034   ALKPHOS 69 01/08/2012 0939   BILITOT 0.4 08/10/2022 1034   BILITOT 1.1 (H) 01/08/2012 0939   PROT 6.8 08/10/2022 1034   PROT 6.5 09/12/2013 1002   PROT 6.9 01/08/2012 0939   ALBUMIN 3.5 08/10/2022 1034   ALBUMIN 4.4 09/12/2013 1002   ALBUMIN 3.5 01/08/2012 0939    RADIOGRAPHIC STUDIES: MR Brain W Wo Contrast  Result Date: 08/20/2022 CLINICAL DATA:  Proptosis.  Visual changes.  History of lung cancer. EXAM: MRI HEAD WITHOUT AND WITH CONTRAST TECHNIQUE:  Multiplanar, multiecho pulse sequences of the brain and surrounding structures were obtained without and with intravenous contrast. CONTRAST:  74m GADAVIST GADOBUTROL 1 MMOL/ML IV SOLN COMPARISON:  Head MRI 02/07/2022 FINDINGS: Brain: There is no evidence of an acute infarct, intracranial hemorrhage, mass, midline shift, or extra-axial fluid collection. Scattered small T2 hyperintensities in the cerebral white matter bilaterally are unchanged and nonspecific but compatible with mild chronic small vessel ischemic disease. 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 The ventricles  and sulci are within normal limits for age. No abnormal enhancement is identified. Vascular: Major intracranial vascular flow voids are preserved. Skull and upper cervical spine: Unremarkable bone marrow signal. Sinuses/Orbits: Left cataract extraction. No orbital mass is evident on this nondedicated study. Mild mucosal thickening in the left maxillary sinus. No significant mastoid fluid. Other: None. IMPRESSION: 1. No acute intracranial abnormality or evidence of intracranial metastases. 2. Mild chronic small vessel ischemic disease. Electronically Signed   By: ALogan BoresM.D.   On: 08/20/2022 17:33    PERFORMANCE STATUS (ECOG) : 1 - Symptomatic but completely ambulatory  Review of Systems Unless otherwise noted, a complete review of systems is negative.  Physical Exam General: NAD Cardiovascular: regular rate and rhythm Pulmonary: Rhonchi of right lung MSK: There is tenderness of the anterolateral right ribcage around the 5th rib.   Abdomen: soft, nontender, + bowel sounds GU: no suprapubic tenderness Extremities: no edema, no joint deformities Skin: no rashes visible  Neurological: Weakness but otherwise nonfocal  Assessment and Plan- Patient is a 66y.o. male    Encounter Diagnoses  Name Primary?   Acute cough Yes   Rib pain on right side    New. Chest/rib x ray needed at this time to further investigate. He is agreeable and will have this completed today. For now he will start the augmentin and decadron. They will alert me to any red flag signs or symptoms. Continue his current RX pain medications as they have been assisting with his pain.     Patient expressed understanding and was in agreement with this plan. He also understands that He can call clinic at any time with any questions, concerns, or complaints.   Thank you for allowing me to participate in the care of this very pleasant patient.   Time Total: 25  Visit consisted of counseling and education dealing with the  complex and emotionally intense issues of symptom management in the setting of serious illness.Greater than 50%  of this time was spent counseling and coordinating care related to the above assessment and plan.  Signed by: SNelwyn Salisbury PA-C

## 2022-08-25 NOTE — Progress Notes (Addendum)
Pulmonologist recently prescribed - Augmentin and decadron scripts for pt's shortness of breath & cough/congestion. HE has not started on any of these prescription.  Pt has not had a cxr. Pt added to smc schedule to discuss right chest wall pain. "It feels almost like a cracked rib but the pain is located under my right armpit." Rates pain 1/10. Last pain meds taken prior to arrival to cancer center. Pain has been 7 to 8 out of 10 stabbing pain.  Pain "does not have a set pattern. It often occurs with a twinge unexpectedly."

## 2022-08-30 ENCOUNTER — Other Ambulatory Visit: Payer: Self-pay | Admitting: *Deleted

## 2022-08-30 MED ORDER — IPRATROPIUM-ALBUTEROL 0.5-2.5 (3) MG/3ML IN SOLN: 3.0000 mL | Freq: Four times a day (QID) | RESPIRATORY_TRACT | 1 refills | Status: DC | PRN

## 2022-08-31 ENCOUNTER — Inpatient Hospital Stay: Payer: Medicare PPO

## 2022-08-31 ENCOUNTER — Telehealth: Payer: Self-pay

## 2022-08-31 VITALS — Ht 72.0 in | Wt 162.8 lb

## 2022-08-31 DIAGNOSIS — C3411 Malignant neoplasm of upper lobe, right bronchus or lung: Secondary | ICD-10-CM

## 2022-08-31 DIAGNOSIS — Z95828 Presence of other vascular implants and grafts: Secondary | ICD-10-CM

## 2022-08-31 DIAGNOSIS — E860 Dehydration: Secondary | ICD-10-CM

## 2022-08-31 MED ORDER — SODIUM CHLORIDE 0.9% FLUSH
10.0000 mL | Freq: Once | INTRAVENOUS | Status: AC
  Administered 2022-08-31: 10 mL via INTRAVENOUS
  Filled 2022-08-31: qty 10

## 2022-08-31 MED ORDER — HEPARIN SOD (PORK) LOCK FLUSH 100 UNIT/ML IV SOLN
500.0000 [IU] | Freq: Once | INTRAVENOUS | Status: AC
  Administered 2022-08-31: 500 [IU]
  Filled 2022-08-31: qty 5

## 2022-08-31 MED ORDER — SODIUM CHLORIDE 0.9 % IV SOLN
INTRAVENOUS | Status: DC
  Filled 2022-08-31 (×2): qty 250

## 2022-08-31 NOTE — Telephone Encounter (Signed)
Pt's wife asking if he can get his CT before the end of the year because he has met his deductible.

## 2022-09-02 ENCOUNTER — Inpatient Hospital Stay: Payer: Medicare PPO

## 2022-09-02 VITALS — BP 120/74 | HR 80 | Temp 97.6°F | Resp 20

## 2022-09-02 DIAGNOSIS — C3411 Malignant neoplasm of upper lobe, right bronchus or lung: Secondary | ICD-10-CM

## 2022-09-02 DIAGNOSIS — Z95828 Presence of other vascular implants and grafts: Secondary | ICD-10-CM

## 2022-09-02 MED ORDER — SODIUM CHLORIDE 0.9 % IV SOLN
Freq: Once | INTRAVENOUS | Status: AC
  Filled 2022-09-02: qty 250

## 2022-09-02 MED ORDER — SODIUM CHLORIDE 0.9% FLUSH
10.0000 mL | Freq: Once | INTRAVENOUS | Status: AC
  Administered 2022-09-02: 10 mL via INTRAVENOUS
  Filled 2022-09-02: qty 10

## 2022-09-02 MED ORDER — HEPARIN SOD (PORK) LOCK FLUSH 100 UNIT/ML IV SOLN
500.0000 [IU] | Freq: Once | INTRAVENOUS | Status: AC
  Administered 2022-09-02: 500 [IU]
  Filled 2022-09-02: qty 5

## 2022-09-03 MED FILL — Dexamethasone Sodium Phosphate Inj 100 MG/10ML: INTRAMUSCULAR | Qty: 1 | Status: AC

## 2022-09-06 ENCOUNTER — Encounter: Payer: Self-pay | Admitting: Internal Medicine

## 2022-09-06 ENCOUNTER — Inpatient Hospital Stay (HOSPITAL_BASED_OUTPATIENT_CLINIC_OR_DEPARTMENT_OTHER): Payer: Medicare PPO | Admitting: Internal Medicine

## 2022-09-06 ENCOUNTER — Inpatient Hospital Stay: Payer: Medicare PPO

## 2022-09-06 ENCOUNTER — Inpatient Hospital Stay: Payer: Medicare PPO | Attending: Internal Medicine

## 2022-09-06 VITALS — BP 144/85 | HR 94 | Temp 96.6°F | Resp 20 | Wt 161.4 lb

## 2022-09-06 DIAGNOSIS — Z87442 Personal history of urinary calculi: Secondary | ICD-10-CM | POA: Insufficient documentation

## 2022-09-06 DIAGNOSIS — Z9221 Personal history of antineoplastic chemotherapy: Secondary | ICD-10-CM | POA: Insufficient documentation

## 2022-09-06 DIAGNOSIS — Z7984 Long term (current) use of oral hypoglycemic drugs: Secondary | ICD-10-CM | POA: Diagnosis not present

## 2022-09-06 DIAGNOSIS — I10 Essential (primary) hypertension: Secondary | ICD-10-CM | POA: Insufficient documentation

## 2022-09-06 DIAGNOSIS — Z79899 Other long term (current) drug therapy: Secondary | ICD-10-CM | POA: Diagnosis not present

## 2022-09-06 DIAGNOSIS — C3411 Malignant neoplasm of upper lobe, right bronchus or lung: Secondary | ICD-10-CM | POA: Diagnosis present

## 2022-09-06 DIAGNOSIS — J439 Emphysema, unspecified: Secondary | ICD-10-CM | POA: Diagnosis not present

## 2022-09-06 DIAGNOSIS — F17210 Nicotine dependence, cigarettes, uncomplicated: Secondary | ICD-10-CM | POA: Diagnosis not present

## 2022-09-06 DIAGNOSIS — Z8042 Family history of malignant neoplasm of prostate: Secondary | ICD-10-CM | POA: Diagnosis not present

## 2022-09-06 DIAGNOSIS — I6381 Other cerebral infarction due to occlusion or stenosis of small artery: Secondary | ICD-10-CM | POA: Insufficient documentation

## 2022-09-06 DIAGNOSIS — E1140 Type 2 diabetes mellitus with diabetic neuropathy, unspecified: Secondary | ICD-10-CM | POA: Diagnosis not present

## 2022-09-06 DIAGNOSIS — R0789 Other chest pain: Secondary | ICD-10-CM | POA: Insufficient documentation

## 2022-09-06 DIAGNOSIS — E279 Disorder of adrenal gland, unspecified: Secondary | ICD-10-CM | POA: Insufficient documentation

## 2022-09-06 DIAGNOSIS — E1165 Type 2 diabetes mellitus with hyperglycemia: Secondary | ICD-10-CM | POA: Diagnosis not present

## 2022-09-06 LAB — CBC WITH DIFFERENTIAL/PLATELET
Abs Immature Granulocytes: 0.07 10*3/uL (ref 0.00–0.07)
Basophils Absolute: 0.1 10*3/uL (ref 0.0–0.1)
Basophils Relative: 1 %
Eosinophils Absolute: 0.3 10*3/uL (ref 0.0–0.5)
Eosinophils Relative: 3 %
HCT: 39.2 % (ref 39.0–52.0)
Hemoglobin: 12.6 g/dL — ABNORMAL LOW (ref 13.0–17.0)
Immature Granulocytes: 1 %
Lymphocytes Relative: 15 %
Lymphs Abs: 1.7 10*3/uL (ref 0.7–4.0)
MCH: 28.4 pg (ref 26.0–34.0)
MCHC: 32.1 g/dL (ref 30.0–36.0)
MCV: 88.5 fL (ref 80.0–100.0)
Monocytes Absolute: 0.6 10*3/uL (ref 0.1–1.0)
Monocytes Relative: 5 %
Neutro Abs: 8.3 10*3/uL — ABNORMAL HIGH (ref 1.7–7.7)
Neutrophils Relative %: 75 %
Platelets: 205 10*3/uL (ref 150–400)
RBC: 4.43 MIL/uL (ref 4.22–5.81)
RDW: 14.6 % (ref 11.5–15.5)
WBC: 11 10*3/uL — ABNORMAL HIGH (ref 4.0–10.5)
nRBC: 0 % (ref 0.0–0.2)

## 2022-09-06 LAB — COMPREHENSIVE METABOLIC PANEL
ALT: 14 U/L (ref 0–44)
AST: 24 U/L (ref 15–41)
Albumin: 3.6 g/dL (ref 3.5–5.0)
Alkaline Phosphatase: 106 U/L (ref 38–126)
Anion gap: 10 (ref 5–15)
BUN: 12 mg/dL (ref 8–23)
CO2: 26 mmol/L (ref 22–32)
Calcium: 8.8 mg/dL — ABNORMAL LOW (ref 8.9–10.3)
Chloride: 104 mmol/L (ref 98–111)
Creatinine, Ser: 0.92 mg/dL (ref 0.61–1.24)
GFR, Estimated: >60 mL/min (ref >60)
Glucose, Bld: 180 mg/dL — ABNORMAL HIGH (ref 70–99)
Potassium: 3.5 mmol/L (ref 3.5–5.1)
Sodium: 140 mmol/L (ref 135–145)
Total Bilirubin: 0.4 mg/dL (ref 0.3–1.2)
Total Protein: 6.9 g/dL (ref 6.5–8.1)

## 2022-09-06 MED ORDER — SODIUM CHLORIDE 0.9 % IV SOLN
Freq: Once | INTRAVENOUS | Status: AC
  Filled 2022-09-06: qty 250

## 2022-09-06 MED ORDER — SODIUM CHLORIDE 0.9% FLUSH
10.0000 mL | Freq: Once | INTRAVENOUS | Status: DC
  Filled 2022-09-06: qty 10

## 2022-09-06 MED ORDER — ESZOPICLONE 1 MG PO TABS: ORAL_TABLET | ORAL | 1 refills | Status: DC

## 2022-09-06 MED ORDER — POTASSIUM CHLORIDE 20 MEQ/15ML (10%) PO SOLN: 20.0000 meq | Freq: Two times a day (BID) | ORAL | 2 refills | Status: DC

## 2022-09-06 MED ORDER — OXYCODONE HCL 5 MG PO TABS: 5.0000 mg | ORAL_TABLET | Freq: Three times a day (TID) | ORAL | 0 refills | Status: DC | PRN

## 2022-09-06 MED ORDER — HEPARIN SOD (PORK) LOCK FLUSH 100 UNIT/ML IV SOLN
500.0000 [IU] | Freq: Once | INTRAVENOUS | Status: AC
  Administered 2022-09-06: 500 [IU] via INTRAVENOUS
  Filled 2022-09-06: qty 5

## 2022-09-06 NOTE — Progress Notes (Signed)
Patient states pain is starting to go into his back. Patient is asking If he can have his CT scan before the year ends because they have met there deductible. Patient states the potassium pills are to big to swallow even when cutting it in half.

## 2022-09-06 NOTE — Assessment & Plan Note (Addendum)
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   May 05, 2022-s/p bronchoscopy- with Dr.Aleskerov-significant purulent/necrotic debris noted.  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 There is some clearing of some of the right lower lobe airspace opacity and volume loss compared to the prior exam.  Chronic rib destruction noted fourth right and third rib-?  Radiation versus malignancy.  Prominent stable hilar/mediastinal lymph nodes.  Overall malignancy appears stable.    #Recommend continued surveillance without any progression of lung malignancy-especially given the concern for chronic/active infection/inflammation. Last [name] was in May-June 2023.  Given the inconclusive imaging October 2023 I would recommend PET scan.    #Right chest wall pain-/pleuritic-rib destruction malignancy versus radiation- OFF fenatny patch 25 mcg.  Oxycodone 5 mg every 8 to 12 hours hours as needed overall- STABLE.  Again await PET scan.  # COPD/fatigue- [coughing/phlegm/ no fevers]- on albuterol/advair [smoking]- TSH- WNL;Continue  Xopenex/ipratropium nebs-; Mucinex- DM BID.  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 Bary Castilla.  CT OCt 5th, 2023-Filling defect in the cecum near the appendiceal orifice measuring about 3.4 by 2.9 cm, concerning for mass, as noted on prior exam. There is also abnormal dilation of the appendix up to 1.1 cm in diameter, similar to previous.  Patient not too keen on any surgical interventions at this time.  Discussed with  Dr. Bary Castilla.; await PET scan.   # Poorly controlled diabetes-blood sugars 113-  280;  Continue glipizide; and  Metformin. STABLE.  # IV mediport: functioning/STABLE  # Hypokalemia-continue potassium 20 mEq liquid.  # Insomnia-continue Lunesta refill.  *appt thru mychart   # DISPOSITION:  # today- IVFs over 1 hour.   # 12/06  IVFs over 1 hour.  # in 1 week- IVFs-1 lit /1 hourTuesdays/Thursday   # in 2 weeks-  IVFs-1 lit /1 hourTuesdays/Thursday   # in 3 weeks-  IVFs-1 lit /1 hourTuesdays/Thursday   # follow up in 4 weeks-Tuesdays; MD; labs- cbc/cmp'  IVFs-1 lit /1 hour; PET prior- [last week of dec 2023] - Dr.B

## 2022-09-06 NOTE — Progress Notes (Signed)
[name] CONSULT NOTE  Patient Care Team: Vega, James, MD as PCP - General (Family Medicine) Vega, Hayley, RN as Oncology Nurse Navigator Vega, Rodney R, MD as Consulting Physician (Urology) Vega, Rodney R, MD as Consulting Physician (Oncology) Vega, Fuad, MD as Consulting Physician (Pulmonary Disease)  CHIEF COMPLAINTS/PURPOSE OF CONSULTATION: lung cancer    Oncology History Overview Note  AUG 2022- 8.2 x 5.9 cm spiculated mass noted in right upper lobe consistent with malignancy. It extends from the right hilum to the lateral chest wall and results in lytic destruction of the lateral portion of the right fourth rib.  Mediastinal lymph nodes are noted, including 12 mm right hilar lymph node, consistent with metastatic disease. 3 cm right adrenal mass is noted concerning for metastatic disease.  # AUG 2022- PET IMPRESSION: Peripherally hypermetabolic right upper lobe mass, likely due to central necrosis. Mass extends into the right hilum and right lateral chest wall involving the second through fourth right ribs.   Mildly enlarged and hypermetabolic right hilar lymph node.   No evidence of metastatic disease in the abdomen or pelvis.  # AUG, 2022-RIGHT CHEST WALL Bx-necrosis; suspicious for malignancy not definitive [discussed with Dr.Kraynie;MDT] LUNG MASS, RIGHT; BIOPSY:  - SUSPICIOUS FOR MALIGNANCY.  - SEE COMMENT.   Comment:  Approximately six tissue cores demonstrate inflamed fibrous capsule with  hemosiderin-laden macrophages, in a background of abundant necrosis.  One of the cores demonstrates a minute fragment of viable epithelial  cells.  These cells are positive for p40.  They are negative for TTF1  and CK7. The cells of interest disappear on deeper cut levels.  While  the findings are suspicious for squamous cell carcinoma, there is not  enough viable tumor for a definitive diagnosis.  Additional tissue  sampling may be helpful.    # SEP 2022-cycle #2 Taxol hypersensitive reaction.  Switched over to #3 cycle- carbo-Abraxane starting 06/29/2021  # DEC 2022- s/p ID- Dr.ravishankar- ? Lung abscess-no antibiotics-  JAN 2023- CT scan incidental- cecal mass- colonoscopy[Dr.Russo; [facility]- high grade dysplasia]  S/p evaluation with Dr. Byrnett [hold surgery for now]   # S/p evaluation with Dr. Wolf.   Primary cancer of right upper lobe of lung (HCC)  06/03/2021 Initial Diagnosis   Primary cancer of right upper lobe of lung (HCC)   06/03/2021 Cancer Staging   Staging form: Lung, AJCC 8th Edition - Clinical: Stage IIIB (cT3, cN2, cM0) - Signed by Vega, Rodney R, MD on 06/03/2021   06/15/2021 - 08/03/2021 Chemotherapy   Patient is on Treatment Plan : LUNG Carboplatin / Paclitaxel + XRT q7d     10/02/2021 - 10/19/2021 Chemotherapy   Patient is on Treatment Plan : LUNG Durvalumab q14d     02/04/2022 -  Chemotherapy   Patient is on Treatment Plan : LUNG NSCLC Pembrolizumab (200) q21d       HISTORY OF PRESENTING ILLNESS: Patient is ambulating independently.  Accompanied by wife.  Rodney Vega 66 y.o.  male history of smoking -"lung cancer" [Limited necrotic tissue]-locally advanced unresectable; and sigmoid colon cancer intact- currently on surveillance is here for follow-up. Patient's Keytruda currently on hold given his necrotic mass in the right lung.      Patient states pain is starting to go into his back. Patient is asking If he can have his CT scan before the year ends because they have met there deductible. Patient states the potassium pills are to big to swallow.     Overall patient's appetite and   energy is improving.  Chronic right chest wall pain for which patient is taking Oxycodone 5 mg twice with occasional mid day dose if needed with good pain control.  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     Chronic mild shortness of breath mild cough.  However no fever no chills.  He continues to get IV  fluids-twice a week.  Denies abdominal pain nausea vomiting.  No blood in stools black or stools.  Review of Systems  Constitutional:  Positive for malaise/fatigue and weight loss. Negative for chills, diaphoresis and fever.  HENT:  Negative for nosebleeds and sore throat.   Eyes:  Negative for double vision.  Respiratory:  Positive for cough, sputum production and shortness of breath. Negative for hemoptysis and wheezing.   Cardiovascular:  Positive for chest pain. Negative for palpitations, orthopnea and leg swelling.  Gastrointestinal:  Positive for nausea. Negative for abdominal pain, blood in stool, constipation, diarrhea, heartburn, melena and vomiting.  Genitourinary:  Negative for dysuria, frequency and urgency.  Musculoskeletal:  Negative for back pain and joint pain.  Skin: Negative.  Negative for itching and rash.  Neurological:  Positive for tingling. Negative for dizziness, focal weakness, weakness and headaches.  Endo/Heme/Allergies:  Does not bruise/bleed easily.  Psychiatric/Behavioral:  Negative for depression. The patient is not nervous/anxious and does not have insomnia.      MEDICAL HISTORY:  Past Medical History:  Diagnosis Date   Anemia    Cancer (HCC)    COPD (chronic obstructive pulmonary disease) (HCC)    Degenerative arthritis of knee    Depression    Diabetes mellitus without complication (HCC)    Diverticulitis    Glaucoma    since 2004   Hernia 1979   History of kidney stones    Hypertension    Neuromuscular disorder (HCC)    Personal history of tobacco use, presenting hazards to health    Pneumonia    Pre-diabetes    Screening for obesity    Special screening for malignant neoplasms, colon    Wears dentures    full upper and lower    SURGICAL HISTORY: Past Surgical History:  Procedure Laterality Date   BACK SURGERY  1994, 2012   lumbar bulging disc   BRONCHOSCOPY  05/05/2022   CATARACT EXTRACTION W/PHACO Left 01/20/2021   Procedure:  CATARACT EXTRACTION PHACO AND INTRAOCULAR LENS PLACEMENT (IOC) LEFT;  Surgeon: Porfilio, William, MD;  Location: [facility];  Service: Ophthalmology;  Laterality: Left;  10.13 0:52.1   COLONOSCOPY  2003,2013   [facility]/Dr. Sankar-a sessile polyp,5mm, found in rectum,multiple diverticula were found in the sigmoid, in descending and in transverse colon   COLONOSCOPY WITH PROPOFOL N/A 10/22/2021   Procedure: COLONOSCOPY WITH PROPOFOL;  Surgeon: Rodney Vega, Vaden Michael, DO;  Location: [facility];  Service: Gastroenterology;  Laterality: N/A;  DM   COLOSTOMY  04/20/2013   EYE SURGERY     HERNIA REPAIR  1979   inguinal   IR IMAGING GUIDED PORT INSERTION  06/10/2021   JOINT REPLACEMENT Right    knee   KNEE ARTHROPLASTY Right 05/08/2018   Procedure: COMPUTER ASSISTED TOTAL KNEE ARTHROPLASTY;  Surgeon: Hooten, Rodney P, MD;  Location: [facility];  Service: Orthopedics;  Laterality: Right;   KNEE ARTHROPLASTY Left 11/16/2019   Procedure: COMPUTER ASSISTED TOTAL KNEE ARTHROPLASTY;  Surgeon: Hooten, Rodney P, MD;  Location: [facility];  Service: Orthopedics;  Laterality: Left;   LAPAROTOMY  04/20/2013   colon resection with colosotmy   LITHOTRIPSY  2013     LUNG BIOPSY  2023   TONSILLECTOMY AND ADENOIDECTOMY  1962   VIDEO BRONCHOSCOPY WITH ENDOBRONCHIAL NAVIGATION N/A 05/05/2022   Procedure: VIDEO BRONCHOSCOPY WITH ENDOBRONCHIAL NAVIGATION;  Surgeon: Ottie Glazier, MD;  Location: [facility];  Service: Thoracic;  Laterality: N/A;   VIDEO BRONCHOSCOPY WITH ENDOBRONCHIAL ULTRASOUND N/A 05/05/2022   Procedure: VIDEO BRONCHOSCOPY WITH ENDOBRONCHIAL ULTRASOUND;  Surgeon: Ottie Glazier, MD;  Location: [facility];  Service: Thoracic;  Laterality: N/A;    SOCIAL HISTORY: Social History   Socioeconomic History   Marital status: Married    Spouse name: Pegge   Number of children: Not on file   Years of education: Not on file   Highest education level: Not on file  Occupational History   Not on file   Tobacco Use   Smoking status: Every Day    Packs/day: 1.00    Years: 30.00    Total pack years: 30.00    Types: Cigarettes   Smokeless tobacco: Never   Tobacco comments:    since age 77 or 76  Vaping Use   Vaping Use: Never used  Substance and Sexual Activity   Alcohol use: Not Currently    Alcohol/week: 2.0 standard drinks of alcohol    Types: 2 Standard drinks or equivalent per week    Comment: 1-2 drinks per week   Drug use: Yes    Types: Marijuana    Comment: 2-3 week   Sexual activity: Not on file  Other Topics Concern   Not on file  Social History Narrative   15 mins [place]; smoking; not much alcohol. Worked for [employer], 2019. Marland [employer]    Social Determinants of Health   Financial Resource Strain: Low Risk  (08/10/2022)   Overall Financial Resource Strain (CARDIA)    Difficulty of Paying Living Expenses: Not very hard  Food Insecurity: No Food Insecurity (08/10/2022)   Hunger Vital Sign    Worried About Running Out of Food in the Last Year: Never true    Ran Out of Food in the Last Year: Never true  Transportation Needs: Not on file  Physical Activity: Not on file  Stress: No Stress Concern Present (08/10/2022)   [employer] [facility] [place]    Feeling of Stress : Not at all  Social Connections: Unknown (08/10/2022)   Social Connection and Isolation Panel [NHANES]    Frequency of Communication with Friends and Family: Three times a week    Frequency of Social Gatherings with Friends and Family: Twice a week    Attends Religious Services: Patient refused    Marine scientist or Organizations: No    Attends Archivist Meetings: Never    Marital Status: Married  Human resources officer Violence: Not At Risk (08/10/2022)   Humiliation, Afraid, Rape, and Kick questionnaire    Fear of Current or Ex-Partner: No    Emotionally Abused: No    Physically Abused: No    Sexually Abused: No    FAMILY  HISTORY: Family History  Problem Relation Age of Onset   Diabetes Mother    Heart disease Mother    Heart attack Father    Prostate cancer Father     ALLERGIES:  is allergic to paclitaxel, ambien [zolpidem], nsaids, and aleve [naproxen sodium].  MEDICATIONS:  Current Outpatient Medications  Medication Sig Dispense Refill   Accu-Chek Softclix Lancets lancets USE AS DIRECTED FOUR TIMES DAILY 100 each 12   acetaminophen (TYLENOL) 500 MG tablet Take 2 tablets (1,000  mg total) by mouth daily as needed. Home med. 30 tablet 0   albuterol (PROVENTIL) (2.5 MG/3ML) 0.083% nebulizer solution Take 2.5 mg by nebulization every 4 (four) hours as needed for wheezing or shortness of breath.     albuterol (VENTOLIN HFA) 108 (90 Base) MCG/ACT inhaler SMARTSIG:2 inhalation Via Inhaler Every 6 Hours PRN 1 each 2   blood glucose meter kit and supplies KIT Check your blood glucose levels twice a day; once in the morning before breakfast; and once in the evening after dinner. 1 each 0   BREZTRI AEROSPHERE 160-9-4.8 MCG/ACT AERO Inhale 2 puffs into the lungs 2 (two) times daily.     cetirizine (ZYRTEC) 10 MG tablet Take 10 mg by mouth daily.     clobetasol cream (TEMOVATE) 4.82 % Apply 1 Application topically 2 (two) times daily. 30 g 2   dextromethorphan-guaiFENesin (MUCINEX DM) 30-600 MG 12hr tablet Take 1 tablet by mouth 2 (two) times daily as needed for cough.     dorzolamide-timolol (COSOPT) 22.3-6.8 MG/ML ophthalmic solution Place 1 drop into both eyes 2 (two) times daily.     ergocalciferol (VITAMIN D2) 1.25 MG (50000 UT) capsule Take 1 capsule (50,000 Units total) by mouth once a week. 12 capsule 1   glipiZIDE (GLUCOTROL) 5 MG tablet TAKE 1 TABLET(5 MG) BY MOUTH DAILY BEFORE BREAKFAST 90 tablet 2   glucose blood test strip Check your blood glucose levels twice a day; once in the morning before breakfast; and once in the evening after dinner. 100 each 12   guaiFENesin-codeine 100-10 MG/5ML syrup Take 5 mLs  by mouth daily as needed for cough. 120 mL 0   Hydrocod Polst-Chlorphen Polst (CHLORPHENIRAMINE-HYDROCODONE) 10-8 MG/5ML Take 5 mLs by mouth at bedtime as needed for cough. 140 mL 0   ipratropium-albuterol (DUONEB) 0.5-2.5 (3) MG/3ML SOLN Take 3 mLs by nebulization every 6 (six) hours as needed. 360 mL 1   latanoprost (XALATAN) 0.005 % ophthalmic solution Place 1 drop into both eyes at bedtime.     lidocaine-prilocaine (EMLA) cream Apply 1 application. topically as needed. 30 g 3   metFORMIN (GLUCOPHAGE) 500 MG tablet Take 1 tablet (500 mg total) by mouth 2 (two) times daily with a meal. 60 tablet 2   montelukast (SINGULAIR) 10 MG tablet TAKE 1 TABLET(10 MG) BY MOUTH AT BEDTIME 90 tablet 0   Multiple Vitamin (MULTIVITAMIN WITH MINERALS) TABS tablet Take 1 tablet by mouth daily.     olmesartan (BENICAR) 40 MG tablet Take 1 tablet (40 mg total) by mouth daily. 90 tablet 1   potassium chloride 20 MEQ/15ML (10%) SOLN Take 15 mLs (20 mEq total) by mouth 2 (two) times daily. 473 mL 2   potassium chloride SA (KLOR-CON M) 20 MEQ tablet Take 1 tablet (20 mEq total) by mouth daily. 30 tablet 1   pregabalin (LYRICA) 50 MG capsule Take 1 capsule (50 mg total) by mouth 2 (two) times daily. 60 capsule 0   prochlorperazine (COMPAZINE) 10 MG tablet Take 10 mg by mouth every 6 (six) hours as needed for nausea or vomiting.     propranolol ER (INDERAL LA) 60 MG 24 hr capsule TAKE 1 CAPSULE(60 MG) BY MOUTH DAILY 30 capsule 3   Respiratory Therapy Supplies (NEBULIZER) DEVI Use as directed 1 each 0   tamsulosin (FLOMAX) 0.4 MG CAPS capsule Take 0.4 mg by mouth daily.     triamcinolone ointment (KENALOG) 0.5 % Apply 1 Application topically 2 (two) times daily. 30 g 0   venlafaxine  XR (EFFEXOR-XR) 75 MG 24 hr capsule Take 225 mg by mouth daily.     vitamin B-12 (CYANOCOBALAMIN) 500 MCG tablet Take 1 tablet by mouth daily.     eszopiclone (LUNESTA) 1 MG TABS tablet TAKE 1 TABLET(1 MG) BY MOUTH AT BEDTIME AS NEEDED FOR  SLEEP 30 tablet 1   oxyCODONE (OXY IR/ROXICODONE) 5 MG immediate release tablet Take 1 tablet (5 mg total) by mouth every 8 (eight) hours as needed for severe pain. 90 tablet 0   No current facility-administered medications for this visit.   Facility-Administered Medications Ordered in Other Visits  Medication Dose Route Frequency Provider Last Rate Last Admin   heparin lock flush 100 UNIT/ML injection            heparin lock flush 100 UNIT/ML injection            heparin lock flush 100 UNIT/ML injection            sodium chloride flush (NS) 0.9 % injection 10 mL  10 mL Intravenous Once Charlaine Dalton R, MD          .  PHYSICAL EXAMINATION: ECOG PERFORMANCE STATUS: 1 - Symptomatic but completely ambulatory  Vitals:   09/06/22 0932  BP: (!) 144/85  Pulse: 94  Resp: 20  Temp: (!) 96.6 F (35.9 C)  SpO2: 99%      Filed Weights   09/06/22 0932  Weight: 161 lb 6.4 oz (73.2 kg)       Physical Exam Vitals and nursing note reviewed.  HENT:     Head: Normocephalic and atraumatic.     Mouth/Throat:     Pharynx: Oropharynx is clear.  Eyes:     Extraocular Movements: Extraocular movements intact.     Pupils: Pupils are equal, round, and reactive to light.  Cardiovascular:     Rate and Rhythm: Normal rate and regular rhythm.  Pulmonary:     Comments: Decreased breath sounds bilaterally.  Abdominal:     Palpations: Abdomen is soft.  Musculoskeletal:        General: Normal range of motion.     Cervical back: Normal range of motion.  Skin:    General: Skin is warm.  Neurological:     General: No focal deficit present.     Mental Status: He is alert and oriented to person, place, and time.  Psychiatric:        Behavior: Behavior normal.        Judgment: Judgment normal.      LABORATORY DATA:  I have reviewed the data as listed Lab Results  Component Value Date   WBC 11.0 (H) 09/06/2022   HGB 12.6 (L) 09/06/2022   HCT 39.2 09/06/2022   MCV 88.5  09/06/2022   PLT 205 09/06/2022   Recent Labs    07/13/22 1022 08/10/22 1034 09/06/22 0917  NA 139 138 140  K 3.2* 3.3* 3.5  CL 103 103 104  CO2 _0 GLUCOSE 224* 201* 180*  BUN _1 CREATININE 0.88 0.97 0.92  CALCIUM 8.6* 8.7* 8.8*  GFRNONAA >60 >60 >60  PROT 6.8 6.8 6.9  ALBUMIN 3.4* 3.5 3.6  AST _2 ALT _3 ALKPHOS 95 92 106  BILITOT 0.4 0.4 0.4    RADIOGRAPHIC STUDIES: I have personally reviewed the radiological images as listed and agreed with the findings in the report. DG Ribs Unilateral W/Chest Right  Result Date: 08/25/2022 CLINICAL DATA:  Cough.  Right rib pain.  History of lung cancer. EXAM: RIGHT RIBS AND CHEST - 3+ VIEW COMPARISON:  Chest radiographs 07/15/2022.  Chest CT 07/06/2022. FINDINGS: A left jugular Port-A-Cath terminates over the SVC, unchanged. The cardiac silhouette is normal in size. Dense right upper lobe consolidation and volume loss with large area cavity is unchanged. Background emphysema is noted. No sizable layering pleural effusion is evident. Segmental erosion of the lateral right fourth rib, focal erosion of the lateral right third rib, and a mildly displaced lateral right fifth rib fracture are unchanged. No new fracture is identified. IMPRESSION: 1. Unchanged nonunited right fifth rib fracture and erosion of the right third and fourth ribs. No acute fracture identified. 2. 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 Electronically Signed   By: Allen  Grady M.D.   On: 08/25/2022 13:23   MR Brain W Wo Contrast  Result Date: 08/20/2022 CLINICAL DATA:  Proptosis.  Visual changes.  History of lung cancer. EXAM: MRI HEAD WITHOUT AND WITH CONTRAST TECHNIQUE: Multiplanar, multiecho pulse sequences of the brain and surrounding structures were obtained without and with intravenous contrast. CONTRAST:  7mL GADAVIST  GADOBUTROL 1 MMOL/ML IV SOLN COMPARISON:  Head MRI 02/07/2022 FINDINGS: Brain: There is no evidence of an acute infarct, intracranial hemorrhage, mass, midline shift, or extra-axial fluid collection. Scattered small T2 hyperintensities in the cerebral white matter bilaterally are unchanged and nonspecific but compatible with mild chronic small vessel ischemic disease. 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 The ventricles and sulci are within normal limits for age. No abnormal enhancement is identified. Vascular: Major intracranial vascular flow voids are preserved. Skull and upper cervical spine: Unremarkable bone marrow signal. Sinuses/Orbits: Left cataract extraction. No orbital mass is evident on this nondedicated study. Mild mucosal thickening in the left maxillary sinus. No significant mastoid fluid. Other: None. IMPRESSION: 1. No acute intracranial abnormality or evidence of intracranial metastases. 2. Mild chronic small vessel ischemic disease. Electronically Signed   By: Allen  Grady M.D.   On: 08/20/2022 17:33    ASSESSMENT & PLAN:   Primary cancer of right upper lobe of lung (HCC) # UNRESECTABLE/LOCALLY ADVANCED- Right upper lobe lung mass -concerning for malignancy [biopsy inconclusive/atypical cells]-  April 2023- PD-L1 positive [cirucologene].   May 05, 2022-s/p bronchoscopy- with Dr.Aleskerov-significant purulent/necrotic debris noted.  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 There is some clearing of some of the right lower lobe airspace opacity and volume loss compared to the prior exam.  Chronic rib destruction noted fourth right and third rib-?  Radiation versus malignancy.  Prominent stable hilar/mediastinal lymph nodes.  Overall malignancy  appears stable.   #Recommend continued surveillance without any progression of lung malignancy-especially given the concern for chronic/active infection/inflammation. Last keytruda was in May-June 2023. Continue surveillance given the overall stability of disease.  But would recommend PET scan.    #Right chest wall pain-/pleuritic-rib destruction malignancy versus radiation- ON  fenatny patch 25 mcg.  Oxycodone 5 mg every 8 to 12 hours hours as needed overall- STABLE.   # COPD/fatigue- [coughing/phlegm/ no fevers]- on albuterol/advair [smoking]- TSH- WNL;Continue  Xopenex/ipratropium nebs-; Mucinex- DM BID.  Stable  # Cecal mass ~ 3 cm s/p colonoscopy-cecal mass clinically suggestive of   malignancy-biopsy high-grade dysplasia;s/p evaluation Dr. Bary Castilla.  CT OCt 5th, 2023-Filling defect in the cecum near the appendiceal orifice measuring about 3.4 by 2.9 cm, concerning for mass, as noted on prior exam. There is also abnormal dilation of the appendix up to 1.1 cm in diameter, similar to previous.  Patient not too keen on any surgical interventions at this time.  Discussed with  Dr. Bary Castilla.; await PET scan.   # Poorly controlled diabetes-blood sugars 113- 280;  Continue glipizide; and  Metformin. STABLE.  # IV mediport: functioning/STABLE  *appt thru mychart   # DISPOSITION:  # today- IVFs over 1 hour.   # 12/06  IVFs over 1 hour.  # in 1 week- IVFs-1 lit /1 hourTuesdays/Thursday   # in 2 weeks-  IVFs-1 lit /1 hourTuesdays/Thursday   # in 3 weeks-  IVFs-1 lit /1 hourTuesdays/Thursday   # follow up in 4 weeks-Tuesdays; MD; labs- cbc/cmp'  IVFs-1 lit /1 hour; PET prior- [last week of dec 2023] - Dr.B         All questions were answered. The patient knows to call the clinic with any problems, questions or concerns.    [name], MD 09/06/2022 1:19 PM

## 2022-09-06 NOTE — Telephone Encounter (Signed)
Patient seen in clinic on 12/4, PET/CT scan scheduled for 12/20.

## 2022-09-07 ENCOUNTER — Other Ambulatory Visit: Payer: Medicare PPO

## 2022-09-07 ENCOUNTER — Ambulatory Visit: Payer: Medicare PPO | Admitting: Internal Medicine

## 2022-09-07 ENCOUNTER — Ambulatory Visit: Payer: Medicare PPO

## 2022-09-08 ENCOUNTER — Inpatient Hospital Stay: Payer: Medicare PPO

## 2022-09-08 VITALS — BP 127/72 | HR 80 | Temp 97.5°F | Resp 20 | Wt 163.6 lb

## 2022-09-08 DIAGNOSIS — C3411 Malignant neoplasm of upper lobe, right bronchus or lung: Secondary | ICD-10-CM | POA: Diagnosis not present

## 2022-09-08 MED ORDER — SODIUM CHLORIDE 0.9% FLUSH
10.0000 mL | Freq: Once | INTRAVENOUS | Status: AC | PRN
  Administered 2022-09-08: 10 mL
  Filled 2022-09-08: qty 10

## 2022-09-08 MED ORDER — SODIUM CHLORIDE 0.9 % IV SOLN
Freq: Once | INTRAVENOUS | Status: AC
  Filled 2022-09-08: qty 250

## 2022-09-08 MED ORDER — HEPARIN SOD (PORK) LOCK FLUSH 100 UNIT/ML IV SOLN
500.0000 [IU] | Freq: Once | INTRAVENOUS | Status: AC | PRN
  Administered 2022-09-08: 500 [IU]
  Filled 2022-09-08: qty 5

## 2022-09-09 ENCOUNTER — Ambulatory Visit: Payer: Medicare PPO

## 2022-09-14 ENCOUNTER — Inpatient Hospital Stay: Payer: Medicare PPO

## 2022-09-15 MED FILL — Dexamethasone Sodium Phosphate Inj 100 MG/10ML: INTRAMUSCULAR | Qty: 1 | Status: AC

## 2022-09-16 ENCOUNTER — Inpatient Hospital Stay (HOSPITAL_BASED_OUTPATIENT_CLINIC_OR_DEPARTMENT_OTHER): Payer: Medicare PPO | Admitting: Hospice and Palliative Medicine

## 2022-09-16 ENCOUNTER — Other Ambulatory Visit: Payer: Self-pay | Admitting: *Deleted

## 2022-09-16 ENCOUNTER — Inpatient Hospital Stay: Payer: Medicare PPO

## 2022-09-16 ENCOUNTER — Ambulatory Visit
Admission: RE | Admit: 2022-09-16 | Discharge: 2022-09-16 | Disposition: A | Discharge status: Home / Self Care | Payer: Medicare PPO | Attending: Hospice and Palliative Medicine | Admitting: Hospice and Palliative Medicine

## 2022-09-16 ENCOUNTER — Ambulatory Visit
Admission: RE | Admit: 2022-09-16 | Discharge: 2022-09-16 | Disposition: A | Discharge status: Home / Self Care | Payer: Medicare PPO | Source: Ambulatory Visit | Attending: Hospice and Palliative Medicine | Admitting: Hospice and Palliative Medicine

## 2022-09-16 DIAGNOSIS — C3411 Malignant neoplasm of upper lobe, right bronchus or lung: Secondary | ICD-10-CM

## 2022-09-16 DIAGNOSIS — U071 COVID-19: Secondary | ICD-10-CM

## 2022-09-16 DIAGNOSIS — R0602 Shortness of breath: Secondary | ICD-10-CM | POA: Diagnosis present

## 2022-09-16 MED ORDER — MOLNUPIRAVIR 200 MG PO CAPS: 4.0000 | ORAL_CAPSULE | Freq: Two times a day (BID) | ORAL | 0 refills | Status: AC

## 2022-09-16 NOTE — Progress Notes (Signed)
Virtual Visit via Telephone Note  I connected with Rodney Vega on 09/16/22 at  2:00 PM EST by telephone and verified that I am speaking with the correct person using two identifiers.  Location: Patient: Home Provider: Clinic   I discussed the limitations, risks, security and privacy concerns of performing an evaluation and management service by telephone and the availability of in person appointments. I also discussed with the patient that there may be a patient responsible charge related to this service. The patient expressed understanding and agreed to proceed.   History of Present Illness: Rodney Vega is a 66 year old man with multiple medical problems including stage IIIa non-small cell lung cancer status post chemotherapy and radiation.    Observations/Objective: Virtual visit with patient/wife.  Patient was sleeping.  Wife reports that patient tested positive for COVID last week on 12/8.  She says that patient had notified pulmonology who started him on steroids.    He has had slow improvement in respiratory symptoms but she reports rebound/worsening of symptoms over the past several days.  Patient has had recurrent productive cough, congestion, rhinorrhea.  No fever or chills.  No shortness of breath.  Assessment and Plan: COVID -rebounding symptoms.  Chest x-ray not suggestive of pneumonia.  Will start patient on molnupiravir.  Discussed symptomatic treatment in detail.  Also reviewed ER triggers and recommended monitoring pulse oximetry.  Patient has PET scan scheduled for next week.  Follow Up Instructions: As previously scheduled   I discussed the assessment and treatment plan with the patient. The patient was provided an opportunity to ask questions and all were answered. The patient agreed with the plan and demonstrated an understanding of the instructions.   The patient was advised to call back or seek an in-person evaluation if the symptoms worsen or if the condition  fails to improve as anticipated.  I provided 15 minutes of non-face-to-face time during this encounter.   Irean Hong, NP

## 2022-09-17 ENCOUNTER — Other Ambulatory Visit: Payer: Self-pay

## 2022-09-17 ENCOUNTER — Other Ambulatory Visit: Payer: Self-pay | Admitting: Internal Medicine

## 2022-09-17 ENCOUNTER — Emergency Department: Payer: Medicare PPO

## 2022-09-17 ENCOUNTER — Emergency Department
Admission: EM | Admit: 2022-09-17 | Discharge: 2022-09-17 | Disposition: A | Discharge status: Home / Self Care | Payer: Medicare PPO | Attending: Emergency Medicine | Admitting: Emergency Medicine

## 2022-09-17 ENCOUNTER — Telehealth: Payer: Self-pay | Admitting: Internal Medicine

## 2022-09-17 ENCOUNTER — Encounter: Payer: Self-pay | Admitting: Emergency Medicine

## 2022-09-17 ENCOUNTER — Other Ambulatory Visit: Payer: Self-pay | Admitting: Medical Oncology

## 2022-09-17 DIAGNOSIS — W0110XA Fall on same level from slipping, tripping and stumbling with subsequent striking against unspecified object, initial encounter: Secondary | ICD-10-CM | POA: Diagnosis not present

## 2022-09-17 DIAGNOSIS — Z859 Personal history of malignant neoplasm, unspecified: Secondary | ICD-10-CM | POA: Insufficient documentation

## 2022-09-17 DIAGNOSIS — S0181XA Laceration without foreign body of other part of head, initial encounter: Secondary | ICD-10-CM

## 2022-09-17 DIAGNOSIS — J449 Chronic obstructive pulmonary disease, unspecified: Secondary | ICD-10-CM | POA: Diagnosis not present

## 2022-09-17 DIAGNOSIS — Y92481 Parking lot as the place of occurrence of the external cause: Secondary | ICD-10-CM | POA: Insufficient documentation

## 2022-09-17 DIAGNOSIS — E119 Type 2 diabetes mellitus without complications: Secondary | ICD-10-CM | POA: Diagnosis not present

## 2022-09-17 DIAGNOSIS — E860 Dehydration: Secondary | ICD-10-CM

## 2022-09-17 DIAGNOSIS — R55 Syncope and collapse: Secondary | ICD-10-CM | POA: Diagnosis not present

## 2022-09-17 LAB — CBC
HCT: 38.4 % — ABNORMAL LOW (ref 39.0–52.0)
Hemoglobin: 12.6 g/dL — ABNORMAL LOW (ref 13.0–17.0)
MCH: 28.8 pg (ref 26.0–34.0)
MCHC: 32.8 g/dL (ref 30.0–36.0)
MCV: 87.9 fL (ref 80.0–100.0)
Platelets: 239 10*3/uL (ref 150–400)
RBC: 4.37 MIL/uL (ref 4.22–5.81)
RDW: 13.7 % (ref 11.5–15.5)
WBC: 11 10*3/uL — ABNORMAL HIGH (ref 4.0–10.5)
nRBC: 0 % (ref 0.0–0.2)

## 2022-09-17 LAB — BASIC METABOLIC PANEL
Anion gap: 8 (ref 5–15)
BUN: 15 mg/dL (ref 8–23)
CO2: 27 mmol/L (ref 22–32)
Calcium: 9.2 mg/dL (ref 8.9–10.3)
Chloride: 104 mmol/L (ref 98–111)
Creatinine, Ser: 1.01 mg/dL (ref 0.61–1.24)
GFR, Estimated: >60 mL/min (ref >60)
Glucose, Bld: 100 mg/dL — ABNORMAL HIGH (ref 70–99)
Potassium: 4.3 mmol/L (ref 3.5–5.1)
Sodium: 139 mmol/L (ref 135–145)

## 2022-09-17 MED ORDER — SODIUM CHLORIDE 0.9 % IV BOLUS (SEPSIS)
1000.0000 mL | Freq: Once | INTRAVENOUS | Status: AC
  Administered 2022-09-17: 1000 mL via INTRAVENOUS

## 2022-09-17 NOTE — Telephone Encounter (Signed)
Patients wife just called to let us know he is in [facility] ER by ambulance- he fell out at the grocery store. She wanted me to let you all know.

## 2022-09-17 NOTE — ED Notes (Signed)
Laceration on side of right eyebrow. And very abrasions on hands.

## 2022-09-17 NOTE — ED Triage Notes (Signed)
Pt comes via EMS with c/o laceration after syncopal episode. Pt is not on thinners. Pt did hit head with loc. Pt doesn't remember fall. Pt is cancer pt and did not receive fluids due to having covid at that time.   BP-116/65 HR-83 95 % RA CBG-102

## 2022-09-17 NOTE — ED Triage Notes (Signed)
Pt to ED for LOC in parking lot, pt did hit right side front of face. Pt has lung cancer and usually gets fluids weekly but has not due to having covid.  Laceration to right forehead, bleeding controlled. Chronic right shoulder pain

## 2022-09-17 NOTE — ED Notes (Signed)
Dr. Corky Downs at beside.

## 2022-09-17 NOTE — ED Provider Notes (Signed)
[facility] Provider Note    Event Date/Time   First MD Initiated Contact with Patient 09/17/22 1508     (approximate)   History   Loss of Consciousness   HPI  Rodney Vega is a 66 y.o. male with a history of cancer, COPD, diabetes who reports he typically gets IV fluids weekly but had COVID last week so has not gotten any fluids.  Felt lightheaded while ambulating and fell and hit the parking lot.  Suffered a laceration above his right eye.  No other injuries reported.  No palpitations, no chest pain, feeling improved now.     Physical Exam   Triage Vital Signs: ED Triage Vitals  Enc Vitals Group     BP 09/17/22 1410 112/71     Pulse Rate 09/17/22 1410 85     Resp 09/17/22 1410 20     Temp 09/17/22 1410 98.6 F (37 C)     Temp src --      SpO2 09/17/22 1410 95 %     Weight 09/17/22 1411 73.5 kg (162 lb)     Height 09/17/22 1411 1.803 m (5\' 11" )     Head Circumference --      Peak Flow --      Pain Score 09/17/22 1410 1     Pain Loc --      Pain Edu? --      Excl. in [place]? --     Most recent vital signs: Vitals:   09/17/22 1410 09/17/22 1514  BP: 112/71 107/76  Pulse: 85 78  Resp: 20 18  Temp: 98.6 F (37 C)   SpO2: 95% 98%     General: Awake, no distress.  CV:  Good peripheral perfusion.  Resp:  Normal effort.  Abd:  No distention.  Other:  Approximately 3 cm laceration above the right eye, bleeding controlled, skin tear to the right hand, shallow abrasions to the knuckles, and right knee.  Moving all extremities well, no chest wall tenderness.  No abdominal tenderness.   ED Results / Procedures / Treatments   Labs (all labs ordered are listed, but only abnormal results are displayed) Labs Reviewed  CBC - Abnormal; Notable for the following components:      Result Value   WBC 11.0 (*)    Hemoglobin 12.6 (*)    HCT 38.4 (*)    All other components within normal limits  BASIC METABOLIC PANEL - Abnormal; Notable for the  following components:   Glucose, Bld 100 (*)    All other components within normal limits     EKG  ED ECG REPORT I, Lavonia Drafts, the attending physician, personally viewed and interpreted this ECG.  Date: 09/17/2022  Rhythm: normal sinus rhythm QRS Axis: normal Intervals: normal ST/T Wave abnormalities: normal Narrative Interpretation: no evidence of acute ischemia    RADIOLOGY CT head viewed interpreted by me, no ICH    PROCEDURES:  Critical Care performed:   Marland KitchenMarland KitchenLaceration Repair  Date/Time: 09/17/2022 4:16 PM  Performed by: Lavonia Drafts, MD Authorized by: Lavonia Drafts, MD   Consent:    Consent obtained:  Verbal   Consent given by:  Patient   Risks discussed:  Pain and poor cosmetic result Universal protocol:    Patient identity confirmed:  Verbally with patient Anesthesia:    Anesthesia method:  None Laceration details:    Location:  Face   Face location:  R eyebrow   Length (cm):  3 Exploration:  Contaminated: no   Treatment:    Area cleansed with:  Soap and water   Amount of cleaning:  Standard Skin repair:    Repair method:  Tissue adhesive Approximation:    Approximation:  Close Repair type:    Repair type:  Simple Post-procedure details:    Dressing:  Adhesive bandage   Procedure completion:  Tolerated    MEDICATIONS ORDERED IN ED: Medications  sodium chloride 0.9 % bolus 1,000 mL (1,000 mLs Intravenous New Bag/Given 09/17/22 1431)     IMPRESSION / MDM / [place] / ED COURSE  I reviewed the triage vital signs and the nursing notes. Patient's presentation is most consistent with acute presentation with potential threat to life or bodily function.   Patient presents after syncopal episode with head injury.  Differential includes dehydration, vasovagal syncope, less likely arrhythmia, electrolyte abnormality, ICH  CT head and cervical spine are reassuring.  Lab work reviewed and overall reassuring.  Patient treated  with IV fluids.  Laceration repaired, patient is feeling well, asymptomatic at this time, close follow-up with cancer center, return precautions discussed.  No indication for admission at this time       FINAL CLINICAL IMPRESSION(S) / ED DIAGNOSES   Final diagnoses:  Dehydration  Syncope and collapse  Facial laceration, initial encounter     Rx / DC Orders   ED Discharge Orders     None        Note:  This document was prepared using Dragon voice recognition software and may include unintentional dictation errors.   Lavonia Drafts, MD 09/17/22 [phone]

## 2022-09-17 NOTE — ED Notes (Signed)
NS is attached to IV in right arm, but was not infusing. Bolus is infusing to gravity after repositioning. Patient is alert and oriented x4. Wife at bedside.

## 2022-09-21 ENCOUNTER — Inpatient Hospital Stay: Payer: Medicare PPO

## 2022-09-21 ENCOUNTER — Ambulatory Visit: Payer: Medicare PPO

## 2022-09-21 VITALS — BP 99/66 | HR 82 | Resp 17 | Wt 157.3 lb

## 2022-09-21 DIAGNOSIS — C3411 Malignant neoplasm of upper lobe, right bronchus or lung: Secondary | ICD-10-CM | POA: Diagnosis not present

## 2022-09-21 MED ORDER — HEPARIN SOD (PORK) LOCK FLUSH 100 UNIT/ML IV SOLN
500.0000 [IU] | Freq: Once | INTRAVENOUS | Status: AC | PRN
  Administered 2022-09-21: 500 [IU]
  Filled 2022-09-21: qty 5

## 2022-09-21 MED ORDER — SODIUM CHLORIDE 0.9 % IV SOLN
Freq: Once | INTRAVENOUS | Status: AC
  Filled 2022-09-21: qty 250

## 2022-09-21 MED ORDER — SODIUM CHLORIDE 0.9% FLUSH
10.0000 mL | Freq: Once | INTRAVENOUS | Status: AC | PRN
  Administered 2022-09-21: 10 mL
  Filled 2022-09-21: qty 10

## 2022-09-22 ENCOUNTER — Ambulatory Visit
Admission: RE | Admit: 2022-09-22 | Discharge: 2022-09-22 | Disposition: A | Discharge status: Home / Self Care | Payer: Medicare PPO | Source: Ambulatory Visit | Attending: Internal Medicine | Admitting: Internal Medicine

## 2022-09-22 DIAGNOSIS — C3411 Malignant neoplasm of upper lobe, right bronchus or lung: Secondary | ICD-10-CM | POA: Diagnosis not present

## 2022-09-22 DIAGNOSIS — D3501 Benign neoplasm of right adrenal gland: Secondary | ICD-10-CM | POA: Insufficient documentation

## 2022-09-22 DIAGNOSIS — M954 Acquired deformity of chest and rib: Secondary | ICD-10-CM | POA: Diagnosis not present

## 2022-09-22 DIAGNOSIS — N200 Calculus of kidney: Secondary | ICD-10-CM | POA: Insufficient documentation

## 2022-09-22 DIAGNOSIS — J439 Emphysema, unspecified: Secondary | ICD-10-CM | POA: Diagnosis not present

## 2022-09-22 DIAGNOSIS — I700 Atherosclerosis of aorta: Secondary | ICD-10-CM | POA: Diagnosis not present

## 2022-09-22 LAB — GLUCOSE, CAPILLARY: Glucose-Capillary: 67 mg/dL — ABNORMAL LOW (ref 70–99)

## 2022-09-22 MED ORDER — FLUDEOXYGLUCOSE F - 18 (FDG) INJECTION
9.1300 | Freq: Once | INTRAVENOUS | Status: AC | PRN
  Administered 2022-09-22: 9.13 via INTRAVENOUS

## 2022-09-23 ENCOUNTER — Ambulatory Visit: Payer: Medicare PPO

## 2022-09-23 ENCOUNTER — Inpatient Hospital Stay: Payer: Medicare PPO

## 2022-09-23 VITALS — BP 127/76 | HR 76 | Temp 97.0°F | Resp 17

## 2022-09-23 DIAGNOSIS — C3411 Malignant neoplasm of upper lobe, right bronchus or lung: Secondary | ICD-10-CM | POA: Diagnosis not present

## 2022-09-23 MED ORDER — SODIUM CHLORIDE 0.9 % IV SOLN
Freq: Once | INTRAVENOUS | Status: AC
  Filled 2022-09-23: qty 250

## 2022-09-23 MED ORDER — HEPARIN SOD (PORK) LOCK FLUSH 100 UNIT/ML IV SOLN
500.0000 [IU] | Freq: Once | INTRAVENOUS | Status: AC | PRN
  Administered 2022-09-23: 500 [IU]
  Filled 2022-09-23: qty 5

## 2022-09-23 MED ORDER — SODIUM CHLORIDE 0.9% FLUSH
10.0000 mL | Freq: Once | INTRAVENOUS | Status: AC | PRN
  Administered 2022-09-23: 10 mL
  Filled 2022-09-23: qty 10

## 2022-09-23 NOTE — Progress Notes (Signed)
Last Friday Pt passed out in [place] parking lot. EMS called and pt evaluated in the ED. Scrapes and bruises. Pt saw Dr A this week and was given IM prednisone injection and po steroids and antibiotics.

## 2022-09-24 ENCOUNTER — Other Ambulatory Visit: Payer: Self-pay | Admitting: Internal Medicine

## 2022-09-25 ENCOUNTER — Other Ambulatory Visit: Payer: Self-pay | Admitting: Internal Medicine

## 2022-09-28 ENCOUNTER — Other Ambulatory Visit: Payer: Self-pay | Admitting: Internal Medicine

## 2022-09-28 ENCOUNTER — Ambulatory Visit: Payer: Medicare PPO

## 2022-09-28 ENCOUNTER — Inpatient Hospital Stay: Payer: Medicare PPO

## 2022-09-28 VITALS — BP 110/68 | HR 80 | Temp 97.0°F | Resp 20 | Wt 162.1 lb

## 2022-09-28 DIAGNOSIS — C3411 Malignant neoplasm of upper lobe, right bronchus or lung: Secondary | ICD-10-CM

## 2022-09-28 MED ORDER — SODIUM CHLORIDE 0.9 % IV SOLN
Freq: Once | INTRAVENOUS | Status: AC
  Filled 2022-09-28: qty 250

## 2022-09-28 MED ORDER — SODIUM CHLORIDE 0.9% FLUSH
10.0000 mL | Freq: Once | INTRAVENOUS | Status: AC | PRN
  Administered 2022-09-28: 10 mL
  Filled 2022-09-28: qty 10

## 2022-09-28 MED ORDER — HEPARIN SOD (PORK) LOCK FLUSH 100 UNIT/ML IV SOLN
500.0000 [IU] | Freq: Once | INTRAVENOUS | Status: DC | PRN
  Filled 2022-09-28: qty 5

## 2022-09-28 NOTE — Progress Notes (Signed)
Pt's wife asking if someone can review PET scan results with them today. Per Dr. Rogue Bussing, he will call them this afternoon. Pt and wife verbalized understanding

## 2022-09-28 NOTE — Progress Notes (Signed)
I called patient to discuss the results of the PET scan.   Will plan to review the patient imaging at next week tumor conference.

## 2022-09-29 MED FILL — Dexamethasone Sodium Phosphate Inj 100 MG/10ML: INTRAMUSCULAR | Qty: 1 | Status: AC

## 2022-09-29 MED FILL — Iron Sucrose Inj 20 MG/ML (Fe Equiv): INTRAVENOUS | Qty: 10 | Status: AC

## 2022-09-30 ENCOUNTER — Ambulatory Visit: Payer: Medicare PPO

## 2022-09-30 ENCOUNTER — Inpatient Hospital Stay: Payer: Medicare PPO

## 2022-09-30 VITALS — BP 137/77 | HR 65 | Temp 97.0°F | Resp 20 | Wt 160.2 lb

## 2022-09-30 DIAGNOSIS — C3411 Malignant neoplasm of upper lobe, right bronchus or lung: Secondary | ICD-10-CM

## 2022-09-30 MED ORDER — HEPARIN SOD (PORK) LOCK FLUSH 100 UNIT/ML IV SOLN
500.0000 [IU] | Freq: Once | INTRAVENOUS | Status: AC | PRN
  Administered 2022-09-30: 500 [IU]
  Filled 2022-09-30: qty 5

## 2022-09-30 MED ORDER — SODIUM CHLORIDE 0.9% FLUSH
10.0000 mL | Freq: Once | INTRAVENOUS | Status: AC | PRN
  Administered 2022-09-30: 10 mL
  Filled 2022-09-30: qty 10

## 2022-09-30 MED ORDER — SODIUM CHLORIDE 0.9 % IV SOLN
Freq: Once | INTRAVENOUS | Status: AC
  Filled 2022-09-30: qty 250

## 2022-10-01 ENCOUNTER — Telehealth: Payer: Self-pay | Admitting: Internal Medicine

## 2022-10-01 IMAGING — CT CT ABD-PEL WO/W CM
2 of 12 series · 9 of 46 positions shown, 15 images · IV contrast (APPLIED)
Comparison: Chest CT 09/09/2021.  PET-CT 05/25/2021

CLINICAL DATA: Hematuria with left hydronephrosis

EXAM:
CT ABDOMEN AND PELVIS WITHOUT AND WITH CONTRAST
TECHNIQUE: Multidetector CT imaging of the abdomen and pelvis was performed
following the standard protocol before and following the bolus
administration of intravenous contrast.
CONTRAST:  100mL OMNIPAQUE IOHEXOL 300 MG/ML  SOLN

[Series 6: coronal pre · coronal · non-contrast · 0.77mm/px · 2 of 99 slices shown, 3 images]
[im 33/99  soft-tissue]
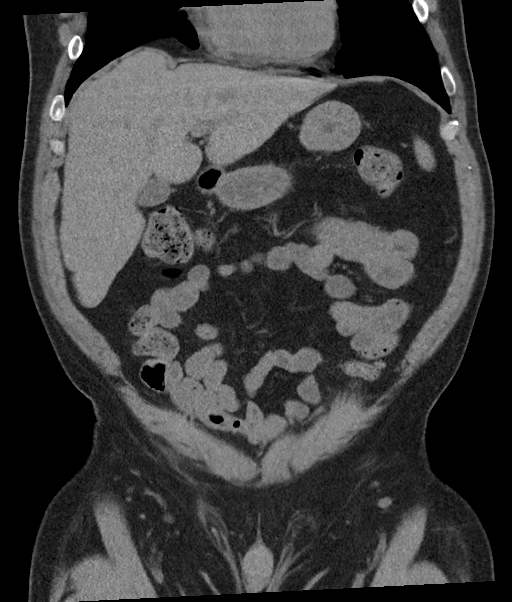
[im 33/99  bone]
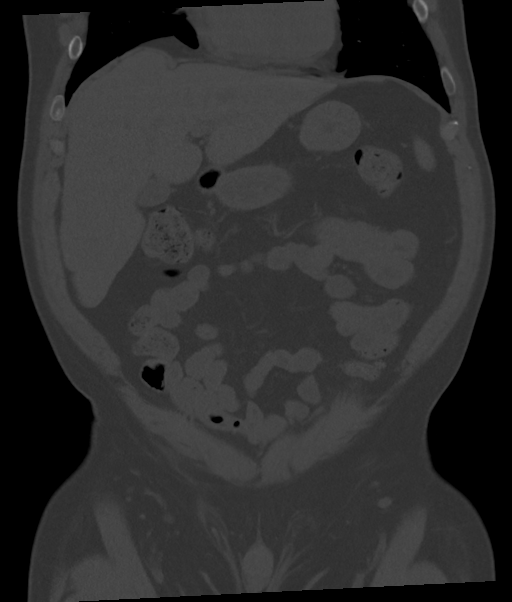
[im 66/99  soft-tissue]
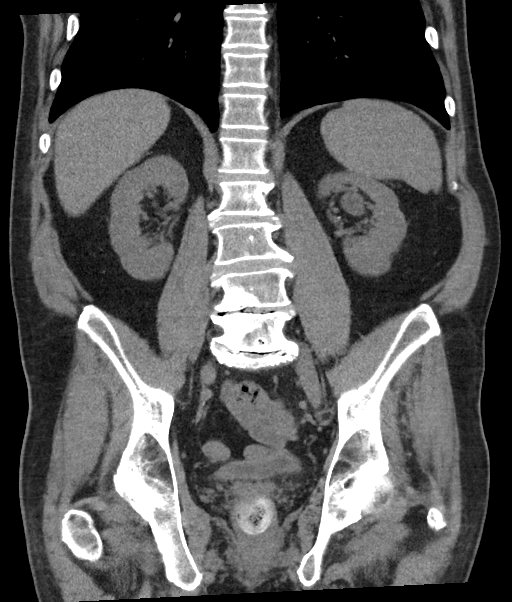

[Series 13: axial delay · axial · delayed · 0.88mm/px · z∈[-445,-80]mm · 7 of 99 slices shown, 12 images]
[im 13/99  soft-tissue]
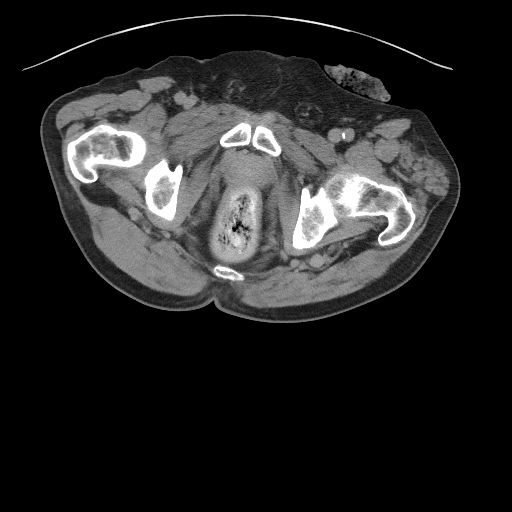
[im 13/99  bone]
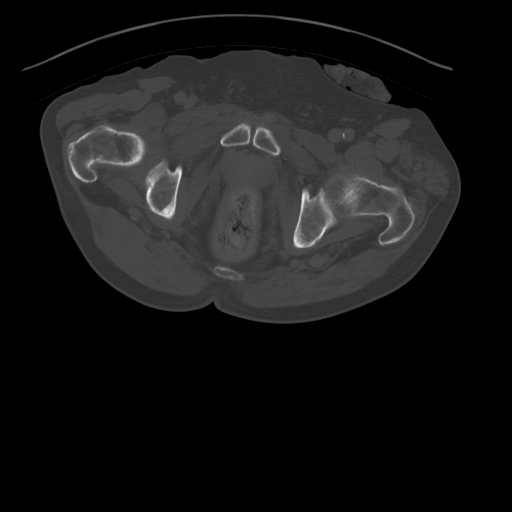
[im 25/99  soft-tissue]
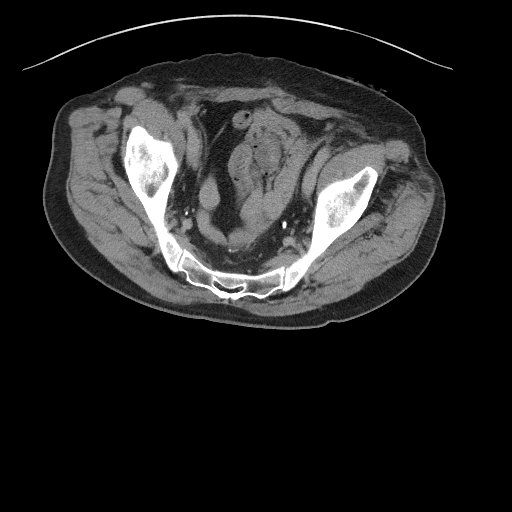
[im 37/99  soft-tissue]
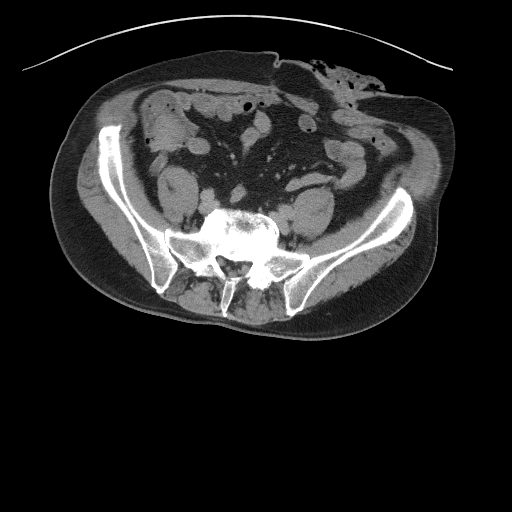
[im 50/99  soft-tissue]
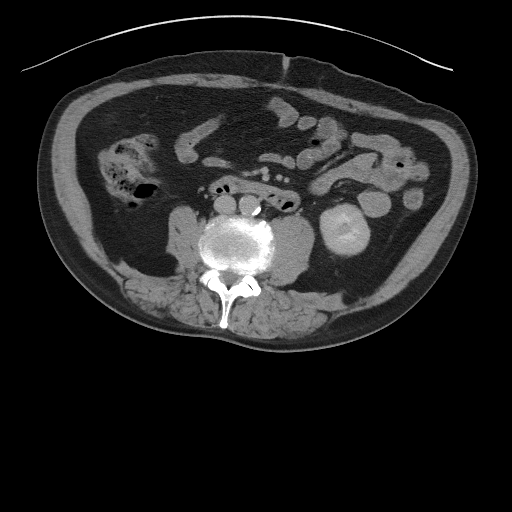
[im 50/99  lung]
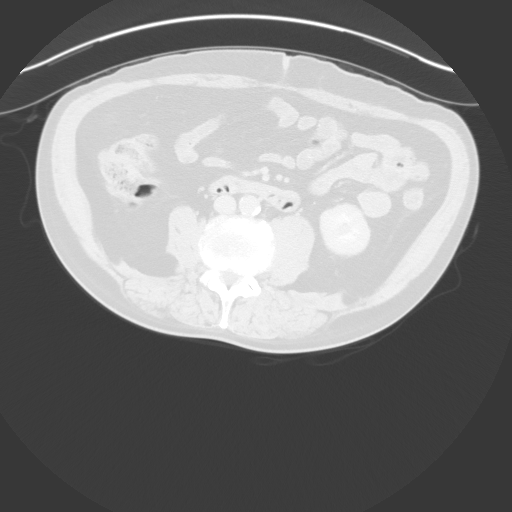
[im 62/99  soft-tissue]
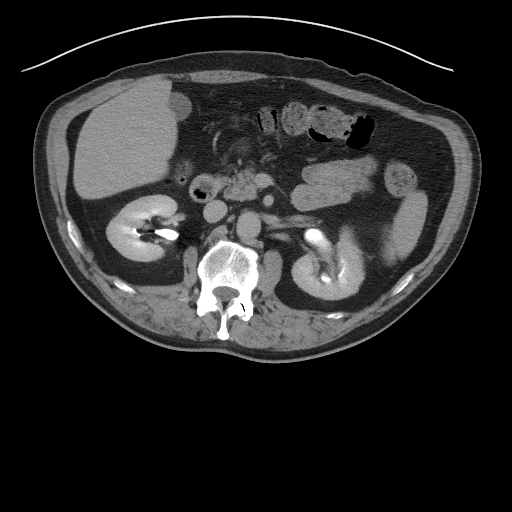
[im 62/99  lung]
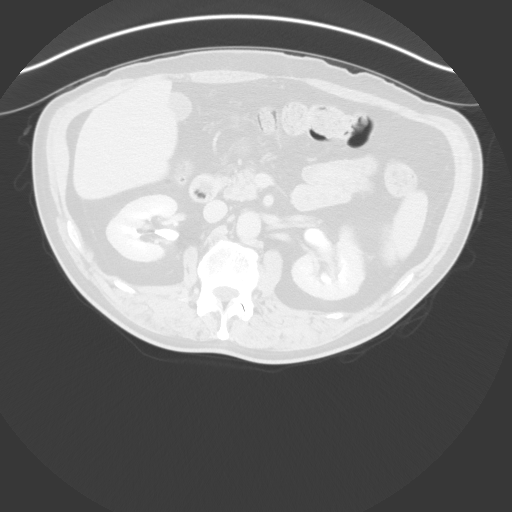
[im 74/99  soft-tissue]
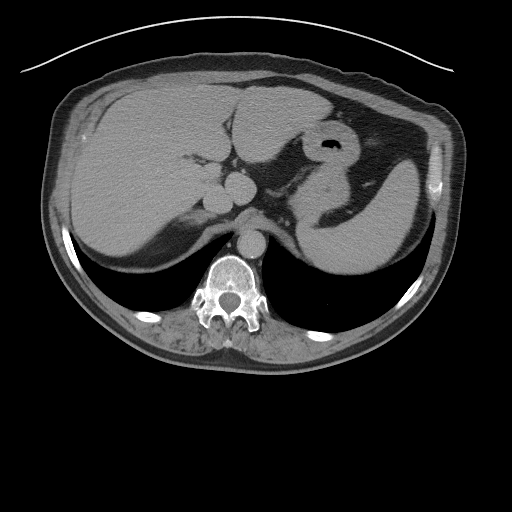
[im 74/99  lung]
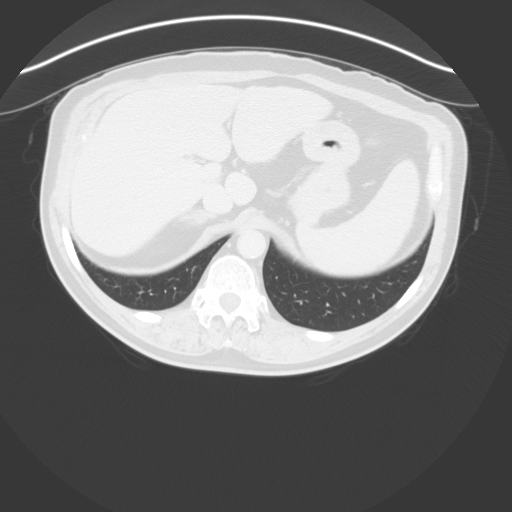
[im 86/99  soft-tissue]
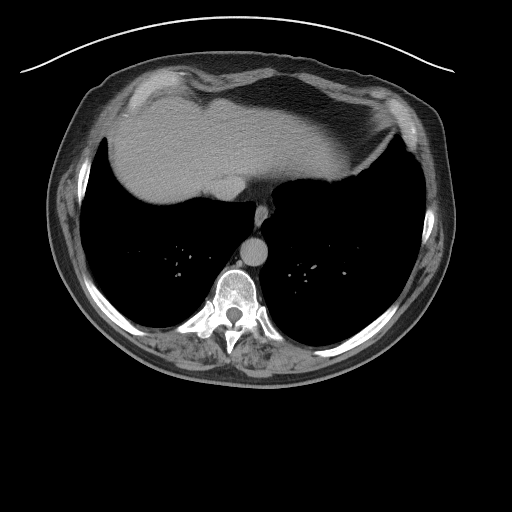
[im 86/99  lung]
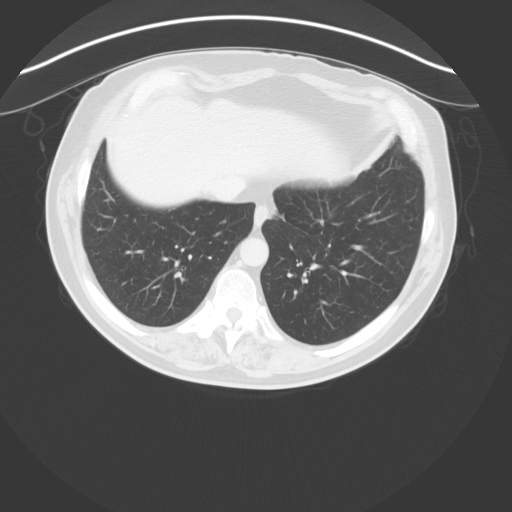

[9 of 46 positions shown; findings below may reference images not displayed]

FINDINGS: Lower chest: Tree-in-bud nodularity in the right middle and lower
lobes is progressive since chest CT 09/09/2021. The large right
upper lobe necrotic pulmonary mass lesion has not been included on
today's study.

Hepatobiliary: No suspicious focal abnormality within the liver
parenchyma. There is no evidence for gallstones, gallbladder wall
thickening, or pericholecystic fluid. No intrahepatic or
extrahepatic biliary dilation.

Pancreas: No focal mass lesion. No dilatation of the main duct. No
intraparenchymal cyst. No peripancreatic edema.

Spleen: No splenomegaly. No focal mass lesion.

Adrenals/Urinary Tract: 2.7 cm right adrenal nodule is stable since
CT scan of 01/07/2012 consistent with benign etiology such as
adenoma. Tiny 11 mm left adrenal nodule also stable since 5754
consistent with benign process.

Precontrast imaging shows no stones in the right kidney or ureter.
No secondary changes in the right kidney or ureter. Multiple
layering tiny stones are seen in the left renal pelvis (image 39/2
on bone windows) with clustered tiny stones in lower pole calices of
the left kidney. No stones in the upper or interpolar left kidney.
There is mild left hydronephrosis, similar to multiple prior studies
including the 5754 exam. Abrupt transition from dilated renal pelvis
to nondilated proximal left ureter suggests UPJ obstruction. Left
ureter unremarkable. No bladder stones.

Imaging after IV contrast administration shows no suspicious
enhancing lesion in either kidney.

Delayed post-contrast imaging shows no wall thickening or soft
tissue filling defect in either intrarenal collecting system or
renal pelvis. Neither ureter is well opacified on delayed imaging,
but there is no focal hydroureter, evidence of ureteral wall
thickening, or mass lesion evident along the course of either
ureter. Delayed imaging of the bladder shows no focal wall
thickening or mass lesion.

Stomach/Bowel: Stomach is unremarkable. No gastric wall thickening.
No evidence of outlet obstruction. Duodenum is normally positioned
as is the ligament of Treitz. Small fat density lesion again noted
transverse duodenum consistent with lipoma. No small bowel wall
thickening. No small bowel dilatation. The terminal ileum is normal.
3.8 x 3.1 cm soft tissue mass is identified in the cecum, involving
the appendiceal orifice. This was present on but much less apparent
on noncontrast CT imaging performed as part of the PET-CT 05/25/2021
and showed low level hypermetabolism. The appendix is dilated up to
about 12 mm diameter, likely secondary to obstruction from the soft
tissue lesion. Left abdominal sigmoid end colostomy evident with
Hartmann's pouch anatomy.

Vascular/Lymphatic: There is mild atherosclerotic calcification of
the abdominal aorta without aneurysm. There is no gastrohepatic or
hepatoduodenal ligament lymphadenopathy. No retroperitoneal or
mesenteric lymphadenopathy. No pelvic sidewall lymphadenopathy.

Reproductive: The prostate gland and seminal vesicles are
unremarkable.

Other: No intraperitoneal free fluid.

Musculoskeletal: No worrisome lytic or sclerotic osseous
abnormality.
IMPRESSION: 1. 3.8 x 3.1 cm soft tissue mass in the cecum, involving the
appendiceal orifice. This was present on but much less apparent on
the noncontrast CT imaging performed as part of the PET-CT
05/25/2021 and showed low level hypermetabolism on the PET images.
Lesion was not present on CT from 02/20/2017. Imaging features are
highly concerning for neoplasm. Appendix is dilated without
periappendiceal edema or inflammation, likely secondary to
obstruction from the cecal lesion.
2. Mild left hydronephrosis with abrupt transition from dilated
renal pelvis to nondilated proximal left ureter. Imaging features
suggest UPJ obstruction.
3. Multiple layering tiny stones in the left renal pelvis with
clustered tiny stones in lower pole calices of the left kidney.
4. Progression of tree-in-bud nodularity in the right middle and
lower lobes consistent with atypical infection. The known large
necrotic right upper lobe pulmonary mass lesion has not been
included on today's study.
5. Stable bilateral adrenal nodules since 5754 consistent with
benign etiology such as adenomas.
6. Aortic Atherosclerosis ([id number]).

These results will be called to the ordering clinician or
representative by the Radiologist Assistant, and communication
documented in the PACS or [REDACTED].

## 2022-10-01 MED FILL — Dexamethasone Sodium Phosphate Inj 100 MG/10ML: INTRAMUSCULAR | Qty: 1 | Status: AC

## 2022-10-01 MED FILL — Iron Sucrose Inj 20 MG/ML (Fe Equiv): INTRAVENOUS | Qty: 10 | Status: AC

## 2022-10-01 NOTE — Telephone Encounter (Signed)
I spoke to patient's wife regarding the results of the PET scan-unclear if inflammation versus residual malignancy.  We discussed that cancer conference.  Patient to keep appointment as planned next week

## 2022-10-05 ENCOUNTER — Inpatient Hospital Stay: Payer: Medicare PPO

## 2022-10-05 ENCOUNTER — Inpatient Hospital Stay (HOSPITAL_BASED_OUTPATIENT_CLINIC_OR_DEPARTMENT_OTHER): Payer: Medicare PPO | Admitting: Internal Medicine

## 2022-10-05 ENCOUNTER — Inpatient Hospital Stay: Payer: Medicare PPO | Attending: Internal Medicine

## 2022-10-05 ENCOUNTER — Encounter: Payer: Self-pay | Admitting: Internal Medicine

## 2022-10-05 VITALS — BP 118/78 | HR 102 | Temp 96.9°F | Resp 18 | Wt 161.7 lb

## 2022-10-05 DIAGNOSIS — J439 Emphysema, unspecified: Secondary | ICD-10-CM | POA: Insufficient documentation

## 2022-10-05 DIAGNOSIS — C3411 Malignant neoplasm of upper lobe, right bronchus or lung: Secondary | ICD-10-CM

## 2022-10-05 DIAGNOSIS — M47812 Spondylosis without myelopathy or radiculopathy, cervical region: Secondary | ICD-10-CM | POA: Insufficient documentation

## 2022-10-05 DIAGNOSIS — E876 Hypokalemia: Secondary | ICD-10-CM | POA: Diagnosis not present

## 2022-10-05 DIAGNOSIS — Z7984 Long term (current) use of oral hypoglycemic drugs: Secondary | ICD-10-CM | POA: Insufficient documentation

## 2022-10-05 DIAGNOSIS — Z79899 Other long term (current) drug therapy: Secondary | ICD-10-CM | POA: Insufficient documentation

## 2022-10-05 DIAGNOSIS — E1140 Type 2 diabetes mellitus with diabetic neuropathy, unspecified: Secondary | ICD-10-CM | POA: Diagnosis not present

## 2022-10-05 DIAGNOSIS — D3501 Benign neoplasm of right adrenal gland: Secondary | ICD-10-CM | POA: Insufficient documentation

## 2022-10-05 DIAGNOSIS — N200 Calculus of kidney: Secondary | ICD-10-CM | POA: Insufficient documentation

## 2022-10-05 DIAGNOSIS — Z87442 Personal history of urinary calculi: Secondary | ICD-10-CM | POA: Insufficient documentation

## 2022-10-05 DIAGNOSIS — I700 Atherosclerosis of aorta: Secondary | ICD-10-CM | POA: Insufficient documentation

## 2022-10-05 DIAGNOSIS — Z933 Colostomy status: Secondary | ICD-10-CM | POA: Diagnosis not present

## 2022-10-05 DIAGNOSIS — E1165 Type 2 diabetes mellitus with hyperglycemia: Secondary | ICD-10-CM | POA: Insufficient documentation

## 2022-10-05 DIAGNOSIS — I10 Essential (primary) hypertension: Secondary | ICD-10-CM | POA: Diagnosis not present

## 2022-10-05 DIAGNOSIS — S2241XA Multiple fractures of ribs, right side, initial encounter for closed fracture: Secondary | ICD-10-CM | POA: Insufficient documentation

## 2022-10-05 DIAGNOSIS — I6523 Occlusion and stenosis of bilateral carotid arteries: Secondary | ICD-10-CM | POA: Insufficient documentation

## 2022-10-05 DIAGNOSIS — F17210 Nicotine dependence, cigarettes, uncomplicated: Secondary | ICD-10-CM | POA: Insufficient documentation

## 2022-10-05 DIAGNOSIS — U071 COVID-19: Secondary | ICD-10-CM | POA: Diagnosis not present

## 2022-10-05 DIAGNOSIS — J449 Chronic obstructive pulmonary disease, unspecified: Secondary | ICD-10-CM | POA: Insufficient documentation

## 2022-10-05 DIAGNOSIS — Z9221 Personal history of antineoplastic chemotherapy: Secondary | ICD-10-CM | POA: Diagnosis not present

## 2022-10-05 LAB — CBC WITH DIFFERENTIAL/PLATELET
Abs Immature Granulocytes: 0.06 10*3/uL (ref 0.00–0.07)
Basophils Absolute: 0.1 10*3/uL (ref 0.0–0.1)
Basophils Relative: 1 %
Eosinophils Absolute: 0.5 10*3/uL (ref 0.0–0.5)
Eosinophils Relative: 6 %
HCT: 35.8 % — ABNORMAL LOW (ref 39.0–52.0)
Hemoglobin: 11.9 g/dL — ABNORMAL LOW (ref 13.0–17.0)
Immature Granulocytes: 1 %
Lymphocytes Relative: 14 %
Lymphs Abs: 1.3 10*3/uL (ref 0.7–4.0)
MCH: 28.5 pg (ref 26.0–34.0)
MCHC: 33.2 g/dL (ref 30.0–36.0)
MCV: 85.9 fL (ref 80.0–100.0)
Monocytes Absolute: 0.8 10*3/uL (ref 0.1–1.0)
Monocytes Relative: 9 %
Neutro Abs: 6.5 10*3/uL (ref 1.7–7.7)
Neutrophils Relative %: 69 %
Platelets: 269 10*3/uL (ref 150–400)
RBC: 4.17 MIL/uL — ABNORMAL LOW (ref 4.22–5.81)
RDW: 14.2 % (ref 11.5–15.5)
WBC: 9.3 10*3/uL (ref 4.0–10.5)
nRBC: 0 % (ref 0.0–0.2)

## 2022-10-05 LAB — COMPREHENSIVE METABOLIC PANEL
ALT: 8 U/L (ref 0–44)
AST: 16 U/L (ref 15–41)
Albumin: 3.6 g/dL (ref 3.5–5.0)
Alkaline Phosphatase: 94 U/L (ref 38–126)
Anion gap: 10 (ref 5–15)
BUN: 13 mg/dL (ref 8–23)
CO2: 25 mmol/L (ref 22–32)
Calcium: 8.7 mg/dL — ABNORMAL LOW (ref 8.9–10.3)
Chloride: 102 mmol/L (ref 98–111)
Creatinine, Ser: 1.1 mg/dL (ref 0.61–1.24)
GFR, Estimated: >60 mL/min (ref >60)
Glucose, Bld: 134 mg/dL — ABNORMAL HIGH (ref 70–99)
Potassium: 3.5 mmol/L (ref 3.5–5.1)
Sodium: 137 mmol/L (ref 135–145)
Total Bilirubin: 0.4 mg/dL (ref 0.3–1.2)
Total Protein: 7.3 g/dL (ref 6.5–8.1)

## 2022-10-05 MED ORDER — SODIUM CHLORIDE 0.9% FLUSH
10.0000 mL | Freq: Once | INTRAVENOUS | Status: AC
  Administered 2022-10-05: 10 mL via INTRAVENOUS
  Filled 2022-10-05: qty 10

## 2022-10-05 MED ORDER — ESZOPICLONE 1 MG PO TABS: ORAL_TABLET | ORAL | 1 refills | Status: DC

## 2022-10-05 MED ORDER — LIDOCAINE-PRILOCAINE 2.5-2.5 % EX CREA: 1.0000 | TOPICAL_CREAM | CUTANEOUS | 3 refills | Status: DC | PRN

## 2022-10-05 MED ORDER — PREGABALIN 50 MG PO CAPS: 50.0000 mg | ORAL_CAPSULE | Freq: Two times a day (BID) | ORAL | 2 refills | Status: DC

## 2022-10-05 MED ORDER — SODIUM CHLORIDE 0.9 % IV SOLN
Freq: Once | INTRAVENOUS | Status: AC
  Filled 2022-10-05: qty 250

## 2022-10-05 MED ORDER — HEPARIN SOD (PORK) LOCK FLUSH 100 UNIT/ML IV SOLN
500.0000 [IU] | Freq: Once | INTRAVENOUS | Status: DC
  Filled 2022-10-05: qty 5

## 2022-10-05 MED ORDER — OXYCODONE HCL 5 MG PO TABS: 5.0000 mg | ORAL_TABLET | Freq: Three times a day (TID) | ORAL | 0 refills | Status: DC | PRN

## 2022-10-05 NOTE — Progress Notes (Unsigned)
Patient denies new problems/concerns today.    Lyrica not filled since 07/2022 and request refill today (pended for MD review).  Seen in ED on 09/17/22 due to fall.  Currently taking Bactrim as prescribed by pulmonologist.

## 2022-10-05 NOTE — Progress Notes (Signed)
[facility] CONSULT NOTE  Patient Care Team: [name], MD as PCP - General (Family Medicine) [name], RN as Oncology Nurse Navigator [name], [name], MD as Consulting Physician (Urology) [name], MD as Consulting Physician (Oncology) [name], MD as Consulting Physician (Pulmonary Disease)  CHIEF COMPLAINTS/PURPOSE OF CONSULTATION: lung cancer    Oncology History Overview Note  AUG 2022- 8.2 x 5.9 cm spiculated mass noted in right upper lobe consistent with malignancy. It extends from the right hilum to the lateral chest wall and results in lytic destruction of the lateral portion of the right fourth rib.  Mediastinal lymph nodes are noted, including 12 mm right hilar lymph node, consistent with metastatic disease. 3 cm right adrenal mass is noted concerning for metastatic disease.  # AUG 2022- PET IMPRESSION: Peripherally hypermetabolic right upper lobe mass, likely due to central necrosis. Mass extends into the right hilum and right lateral chest wall involving the second through fourth right ribs.   Mildly enlarged and hypermetabolic right hilar lymph node.   No evidence of metastatic disease in the abdomen or pelvis.  # AUG, 2022-RIGHT CHEST WALL Bx-necrosis; suspicious for malignancy not definitive [discussed with Dr.Kraynie;MDT] LUNG MASS, RIGHT; BIOPSY:  - SUSPICIOUS FOR MALIGNANCY.  - SEE COMMENT.   Comment:  Approximately six tissue cores demonstrate inflamed fibrous capsule with  hemosiderin-laden macrophages, in a background of abundant necrosis.  One of the cores demonstrates a minute fragment of viable epithelial  cells.  These cells are positive for p40.  They are negative for TTF1  and CK7. The cells of interest disappear on deeper cut levels.  While  the findings are suspicious for squamous cell carcinoma, there is not  enough viable tumor for a definitive diagnosis.  Additional tissue  sampling may be helpful.    # SEP 2022-cycle #2 Taxol hypersensitive reaction.  Switched over to #3 cycle- carbo-Abraxane starting 06/29/2021  # DEC 2022- s/p ID- Dr.ravishankar- ? Lung abscess-no antibiotics-  JAN 2023- CT scan incidental- cecal mass- colonoscopy[Dr.Russo; [facility]- high grade dysplasia]  S/p evaluation with Dr. Sherryle Lis surgery for now]   # S/p evaluation with Dr. Sheppard Penton.   Primary cancer of right upper lobe of lung (HCC)  06/03/2021 Initial Diagnosis   Primary cancer of right upper lobe of lung (HCC)   06/03/2021 Cancer Staging   Staging form: Lung, AJCC 8th Edition - Clinical: Stage IIIB (cT3, cN2, cM0) - Signed by [name], MD on 06/03/2021   06/15/2021 - 08/03/2021 Chemotherapy   Patient is on Treatment Plan : LUNG Carboplatin / Paclitaxel + XRT q7d     10/02/2021 - 10/19/2021 Chemotherapy   Patient is on Treatment Plan : LUNG Durvalumab q14d     02/04/2022 -  Chemotherapy   Patient is on Treatment Plan : LUNG NSCLC Pembrolizumab (200) q21d       HISTORY OF PRESENTING ILLNESS: Patient is ambulating independently.  Accompanied by wife.  Rodney Vega 67 y.o.  male history of smoking -"lung cancer" [Limited necrotic tissue]-locally advanced unresectable; and sigmoid colon cancer intact- currently on surveillance is here for follow-up. Patient's Keytruda currently on hold given his necrotic mass in the right lung- review results of the PEt scan.  Patient was recently evaluated by pulmonary started on Bactrim for underlying infection.  States his cough/breathing is better.  Patient requests refill on his Lyrica and also his narcotic pain medications. Chronic right chest wall pain for which patient is taking Oxycodone 5 mg twice with  occasional mid day dose if needed with good pain control.  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     Chronic mild shortness of breath mild cough.  However no fever no chills.  He continues to get IV fluids-twice a week-feels improved after IV  infusions.  Denies abdominal pain nausea vomiting.  No blood in stools black or stools.  Review of Systems  Constitutional:  Positive for malaise/fatigue and weight loss. Negative for chills, diaphoresis and fever.  HENT:  Negative for nosebleeds and sore throat.   Eyes:  Negative for double vision.  Respiratory:  Positive for cough, sputum production and shortness of breath. Negative for hemoptysis and wheezing.   Cardiovascular:  Positive for chest pain. Negative for palpitations, orthopnea and leg swelling.  Gastrointestinal:  Positive for nausea. Negative for abdominal pain, blood in stool, constipation, diarrhea, heartburn, melena and vomiting.  Genitourinary:  Negative for dysuria, frequency and urgency.  Musculoskeletal:  Negative for back pain and joint pain.  Skin: Negative.  Negative for itching and rash.  Neurological:  Positive for tingling. Negative for dizziness, focal weakness, weakness and headaches.  Endo/Heme/Allergies:  Does not bruise/bleed easily.  Psychiatric/Behavioral:  Negative for depression. The patient is not nervous/anxious and does not have insomnia.      MEDICAL HISTORY:  Past Medical History:  Diagnosis Date   Anemia    Cancer (HCC)    COPD (chronic obstructive pulmonary disease) (HCC)    Degenerative arthritis of knee    Depression    Diabetes mellitus without complication (HCC)    Diverticulitis    Glaucoma    since 2004   Hernia 1979   History of kidney stones    Hypertension    Neuromuscular disorder (HCC)    Personal history of tobacco use, presenting hazards to health    Pneumonia    Pre-diabetes    Screening for obesity    Special screening for malignant neoplasms, colon    Wears dentures    full upper and lower    SURGICAL HISTORY: Past Surgical History:  Procedure Laterality Date   BACK SURGERY  1994, 2012   lumbar bulging disc   BRONCHOSCOPY  05/05/2022   CATARACT EXTRACTION W/PHACO Left 01/20/2021   Procedure: CATARACT  EXTRACTION PHACO AND INTRAOCULAR LENS PLACEMENT (IOC) LEFT;  Surgeon: Galen Manila, MD;  Location: [facility];  Service: Ophthalmology;  Laterality: Left;  10.13 0:52.1   COLONOSCOPY  [id number]   [facility]/Dr. Carmell Austria sessile polyp,28mm, found in rectum,multiple diverticula were found in the sigmoid, in descending and in transverse colon   COLONOSCOPY WITH PROPOFOL N/A 10/22/2021   Procedure: COLONOSCOPY WITH PROPOFOL;  Surgeon: Jaynie Collins, DO;  Location: [facility] END[facility]COPY;  Service: Gastroenterology;  Laterality: N/A;  DM   COLOSTOMY  04/20/2013   EYE SURGERY     HERNIA REPAIR  1979   inguinal   IR IMAGING GUIDED PORT INSERTION  06/10/2021   JOINT REPLACEMENT Right    knee   KNEE ARTHROPLASTY Right 05/08/2018   Procedure: COMPUTER ASSISTED TOTAL KNEE ARTHROPLASTY;  Surgeon: Donato Heinz, MD;  Location: [facility];  Service: Orthopedics;  Laterality: Right;   KNEE ARTHROPLASTY Left 11/16/2019   Procedure: COMPUTER ASSISTED TOTAL KNEE ARTHROPLASTY;  Surgeon: Donato Heinz, MD;  Location: [facility];  Service: Orthopedics;  Laterality: Left;   LAPAROTOMY  04/20/2013   colon resection with colosotmy   LITHOTRIPSY  2013   LUNG BIOPSY  2023   TONSILLECTOMY AND ADENOIDECTOMY  1962  VIDEO BRONCHOSCOPY WITH ENDOBRONCHIAL NAVIGATION N/A 05/05/2022   Procedure: VIDEO BRONCHOSCOPY WITH ENDOBRONCHIAL NAVIGATION;  Surgeon: [name], MD;  Location: [facility];  Service: Thoracic;  Laterality: N/A;   VIDEO BRONCHOSCOPY WITH ENDOBRONCHIAL ULTRASOUND N/A 05/05/2022   Procedure: VIDEO BRONCHOSCOPY WITH ENDOBRONCHIAL ULTRASOUND;  Surgeon: [name], MD;  Location: [facility];  Service: Thoracic;  Laterality: N/A;    SOCIAL HISTORY: Social History   Socioeconomic History   Marital status: Married    Spouse name: Pegge   Number of children: Not on file   Years of education: Not on file   Highest education level: Not on file  Occupational History   Not on file  Tobacco Use    Smoking status: Every Day    Packs/day: 1.00    Years: 30.00    Total pack years: 30.00    Types: Cigarettes   Smokeless tobacco: Never   Tobacco comments:    since age 55 or 50  Vaping Use   Vaping Use: Never used  Substance and Sexual Activity   Alcohol use: Not Currently    Alcohol/week: 2.0 standard drinks of alcohol    Types: 2 Standard drinks or equivalent per week    Comment: 1-2 drinks per week   Drug use: Yes    Types: Marijuana    Comment: 2-3 week   Sexual activity: Not on file  Other Topics Concern   Not on file  Social History Narrative   [place] smoking; not much alcohol. Worked for [employer], 2019. [employer]    Social Determinants of Health   Financial Resource Strain: Low Risk  (08/10/2022)   Overall Financial Resource Strain (CARDIA)    Difficulty of Paying Living Expenses: Not very hard  Food Insecurity: No Food Insecurity (08/10/2022)   Hunger Vital Sign    Worried About Running Out of Food in the Last Year: Never true    Ran Out of Food in the Last Year: Never true  Transportation Needs: Not on file  Physical Activity: Not on file  Stress: No Stress Concern Present (08/10/2022)   [employer] of Occupational Health - Occupational Stress Questionnaire    Feeling of Stress : Not at all  Social Connections: Unknown (08/10/2022)   Social Connection and Isolation Panel [NHANES]    Frequency of Communication with Friends and Family: Three times a week    Frequency of Social Gatherings with Friends and Family: Twice a week    Attends Religious Services: Patient refused    Database administrator or Organizations: No    Attends Banker Meetings: Never    Marital Status: Married  Catering manager Violence: Not At Risk (08/10/2022)   Humiliation, Afraid, Rape, and Kick questionnaire    Fear of Current or Ex-Partner: No    Emotionally Abused: No    Physically Abused: No    Sexually Abused: No    FAMILY HISTORY: Family  History  Problem Relation Age of Onset   Diabetes Mother    Heart disease Mother    Heart attack Father    Prostate cancer Father     ALLERGIES:  is allergic to paclitaxel, ambien [zolpidem], nsaids, and aleve [naproxen sodium].  MEDICATIONS:  Current Outpatient Medications  Medication Sig Dispense Refill   Accu-Chek Softclix Lancets lancets USE AS DIRECTED FOUR TIMES DAILY 100 each 12   acetaminophen (TYLENOL) 500 MG tablet Take 2 tablets (1,000 mg total) by mouth daily as needed. Home med. 30 tablet 0  albuterol (PROVENTIL) (2.5 MG/3ML) 0.083% nebulizer solution Take 2.5 mg by nebulization every 4 (four) hours as needed for wheezing or shortness of breath.     albuterol (VENTOLIN HFA) 108 (90 Base) MCG/ACT inhaler SMARTSIG:2 inhalation Via Inhaler Every 6 Hours PRN 1 each 2   blood glucose meter kit and supplies KIT Check your blood glucose levels twice a day; once in the morning before breakfast; and once in the evening after dinner. 1 each 0   BREZTRI AEROSPHERE 160-9-4.8 MCG/ACT AERO Inhale 2 puffs into the lungs 2 (two) times daily.     cetirizine (ZYRTEC) 10 MG tablet Take 10 mg by mouth daily.     clobetasol cream (TEMOVATE) 0.05 % Apply 1 Application topically 2 (two) times daily. 30 g 2   dorzolamide-timolol (COSOPT) 22.3-6.8 MG/ML ophthalmic solution Place 1 drop into both eyes 2 (two) times daily.     glipiZIDE (GLUCOTROL) 5 MG tablet TAKE 1 TABLET(5 MG) BY MOUTH DAILY BEFORE BREAKFAST 90 tablet 2   glucose blood test strip Check your blood glucose levels twice a day; once in the morning before breakfast; and once in the evening after dinner. 100 each 12   Hydrocod Polst-Chlorphen Polst (CHLORPHENIRAMINE-HYDROCODONE) 10-8 MG/5ML Take 5 mLs by mouth at bedtime as needed for cough. 140 mL 0   ipratropium-albuterol (DUONEB) 0.5-2.5 (3) MG/3ML SOLN Take 3 mLs by nebulization every 6 (six) hours as needed. 360 mL 1   latanoprost (XALATAN) 0.005 % ophthalmic solution Place 1 drop  into both eyes at bedtime.     metFORMIN (GLUCOPHAGE) 500 MG tablet Take 1 tablet (500 mg total) by mouth 2 (two) times daily with a meal. 60 tablet 2   montelukast (SINGULAIR) 10 MG tablet TAKE 1 TABLET(10 MG) BY MOUTH AT BEDTIME 90 tablet 0   Multiple Vitamin (MULTIVITAMIN WITH MINERALS) TABS tablet Take 1 tablet by mouth daily.     olmesartan (BENICAR) 40 MG tablet Take 1 tablet (40 mg total) by mouth daily. 90 tablet 1   potassium chloride 20 MEQ/15ML (10%) SOLN Take 15 mLs (20 mEq total) by mouth 2 (two) times daily. 473 mL 2   prochlorperazine (COMPAZINE) 10 MG tablet Take 10 mg by mouth every 6 (six) hours as needed for nausea or vomiting.     propranolol ER (INDERAL LA) 60 MG 24 hr capsule TAKE 1 CAPSULE(60 MG) BY MOUTH DAILY 30 capsule 3   Respiratory Therapy Supplies (NEBULIZER) DEVI Use as directed 1 each 0   tamsulosin (FLOMAX) 0.4 MG CAPS capsule Take 0.4 mg by mouth daily.     triamcinolone ointment (KENALOG) 0.5 % Apply 1 Application topically 2 (two) times daily. 30 g 0   venlafaxine XR (EFFEXOR-XR) 75 MG 24 hr capsule Take 225 mg by mouth daily.     vitamin B-12 (CYANOCOBALAMIN) 500 MCG tablet Take 1 tablet by mouth daily.     Vitamin D, Ergocalciferol, (DRISDOL) 1.25 MG (50000 UNIT) CAPS capsule TAKE 1 CAPSULE BY MOUTH 1 TIME A WEEK 12 capsule 1   dextromethorphan-guaiFENesin (MUCINEX DM) 30-600 MG 12hr tablet Take 1 tablet by mouth 2 (two) times daily as needed for cough. (Patient not taking: Reported on 10/05/2022)     eszopiclone (LUNESTA) 1 MG TABS tablet TAKE 1 TABLET(1 MG) BY MOUTH AT BEDTIME AS NEEDED FOR SLEEP 30 tablet 1   guaiFENesin-codeine 100-10 MG/5ML syrup Take 5 mLs by mouth daily as needed for cough. (Patient not taking: Reported on 10/05/2022) 120 mL 0   lidocaine-prilocaine (EMLA) cream Apply  1 Application topically as needed. 30 g 3   oxyCODONE (OXY IR/ROXICODONE) 5 MG immediate release tablet Take 1 tablet (5 mg total) by mouth every 8 (eight) hours as needed for  severe pain. 90 tablet 0   pregabalin (LYRICA) 50 MG capsule Take 1 capsule (50 mg total) by mouth 2 (two) times daily. 60 capsule 2   No current facility-administered medications for this visit.   Facility-Administered Medications Ordered in Other Visits  Medication Dose Route Frequency Provider Last Rate Last Admin   heparin lock flush 100 UNIT/ML injection            heparin lock flush 100 UNIT/ML injection            heparin lock flush 100 UNIT/ML injection               .  PHYSICAL EXAMINATION: ECOG PERFORMANCE STATUS: 1 - Symptomatic but completely ambulatory  Vitals:   10/05/22 1000  BP: 118/78  Pulse: (!) 102  Resp: 18  Temp: (!) 96.9 F (36.1 C)      Filed Weights   10/05/22 1000  Weight: 161 lb 11.2 oz (73.3 kg)       Physical Exam Vitals and nursing note reviewed.  HENT:     Head: Normocephalic and atraumatic.     Mouth/Throat:     Pharynx: Oropharynx is clear.  Eyes:     Extraocular Movements: Extraocular movements intact.     Pupils: Pupils are equal, round, and reactive to light.  Cardiovascular:     Rate and Rhythm: Normal rate and regular rhythm.  Pulmonary:     Comments: Decreased breath sounds bilaterally.  Abdominal:     Palpations: Abdomen is soft.  Musculoskeletal:        General: Normal range of motion.     Cervical back: Normal range of motion.  Skin:    General: Skin is warm.  Neurological:     General: No focal deficit present.     Mental Status: He is alert and oriented to person, place, and time.  Psychiatric:        Behavior: Behavior normal.        Judgment: Judgment normal.      LABORATORY DATA:  I have reviewed the data as listed Lab Results  Component Value Date   WBC 9.3 10/05/2022   HGB 11.9 (L) 10/05/2022   HCT 35.8 (L) 10/05/2022   MCV 85.9 10/05/2022   PLT 269 10/05/2022   Recent Labs    08/10/22 1034 09/06/22 0917 09/17/22 1413 10/05/22 0938  NA 138 140 139 137  K 3.3* 3.5 4.3 3.5  CL 103 104  104 102  CO2 26 26 27 25   GLUCOSE 201* 180* 100* 134*  BUN 9 12 15 13   CREATININE 0.97 0.92 1.01 1.10  CALCIUM 8.7* 8.8* 9.2 8.7*  GFRNONAA >60 >60 >60 >60  PROT 6.8 6.9  --  7.3  ALBUMIN 3.5 3.6  --  3.6  AST 22 24  --  16  ALT 8 14  --  8  ALKPHOS 92 106  --  94  BILITOT 0.4 0.4  --  0.4    RADIOGRAPHIC STUDIES: I have personally reviewed the radiological images as listed and agreed with the findings in the report. NM PET Image Restage (PS) Skull Base to Thigh (F-18 FDG)  Result Date: 09/25/2022 CLINICAL DATA:  Subsequent treatment strategy for metastatic non-small cell lung cancer. Last chemotherapy and radiation therapy approximately 2 months ago.  EXAM: NUCLEAR MEDICINE PET SKULL BASE TO THIGH TECHNIQUE: 9.13 mCi F-18 FDG was injected intravenously. Full-ring PET imaging was performed from the skull base to thigh after the radiotracer. CT data was obtained and used for attenuation correction and anatomic localization. Fasting blood glucose: 67 mg/dl COMPARISON:  CTs of the chest, abdomen and pelvis 07/06/2022. PET-CT 05/25/2021. FINDINGS: Mediastinal blood pool activity: SUV max 1.5 NECK: No hypermetabolic cervical lymph nodes are identified. No suspicious activity identified within the pharyngeal mucosal space. Incidental CT findings: Bilateral carotid atherosclerosis. CHEST: Again demonstrated is a thick walled cavitary process replacing the right upper lobe and communicating with the right upper lobe bronchus. 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 However, there is prominent peripheral hypermetabolic activity associated with the cavity, especially along its posteromedial margin (SUV max 5.9) and along its inferolateral margin (SUV max 8.6). Within the aerated portions of the lungs, there is no suspicious hypermetabolic activity or suspicious nodularity. There is an intensely hypermetabolic left hilar lymph node  which measures approximately 2.5 x 1.8 cm on image 87/2 and has an SUV max of 8.2. No other hypermetabolic mediastinal, hilar or axillary lymph nodes are seen. Incidental CT findings: Left IJ Port-A-Cath extends to the superior cavoatrial junction. Atherosclerosis of the aorta, great vessels and coronary arteries. A chronic lung disease with diffuse central airway thickening, emphysema and scattered ground-glass pulmonary opacities. ABDOMEN/PELVIS: There is no hypermetabolic activity within the liver, adrenal glands, spleen or pancreas. There is no hypermetabolic nodal activity in the abdomen or pelvis. There is prominent activity throughout the colon which is nonspecific. However, in correlation with the most recent CT, hypermetabolic activity is present within the question cecal mass which measures approximately 3.6 x 3.1 cm on image 195/2 and has an SUV max of 7.2. Incidental CT findings: Stable 2.6 x 2.2 cm right adrenal adenoma without hypermetabolic activity. Extrarenal left renal pelvis and multiple nonobstructing calculi in the left renal pelvis are again noted. No evidence of ureteral calculus. Hartman pouch without hypermetabolic activity within the remaining rectum. Descending colostomy noted. As above, probable hypermetabolic cecal mass obstructing the appendiceal orifice. SKELETON: Hypermetabolic activity laterally in the right upper chest wall associated with the deformities of the right 3rd, 4th and 5th ribs. The 5th rib is chronically fractured. No other hypermetabolic osseous lesions to suggest distant metastases. Incidental CT findings: Mild thoracolumbar scoliosis and spondylosis. IMPRESSION: 1. The cavitary process in the right upper lobe is similar in size to previous CTS, although demonstrates prominent peripheral hypermetabolic activity. 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 2. Hypermetabolic activity within the adjacent upper right lateral chest wall with right 3rd through 5th rib deformities, suspicious for direct tumor spread. No evidence of distant osseous metastatic disease. 3. Hypermetabolic left hilar lymph node worrisome for metastatic disease. No other hypermetabolic lymph nodes or distant metastases identified. 4. Hypermetabolic cecal mass suspicious for a villous adenoma or colon cancer causing obstruction of the appendiceal orifice. Recommend correlation with colonoscopy. 5. Stable right adrenal adenoma. 6. Nonobstructing left renal calculi. 7. Aortic Atherosclerosis (ICD10-I70.0) and Emphysema (ICD10-J43.9). Electronically Signed   By: Carey Bullocks M.D.   On: 09/25/2022 13:22   CT HEAD WO CONTRAST ( )  Result Date: 09/17/2022 CLINICAL DATA:  Head trauma with syncopal episode EXAM: CT HEAD WITHOUT CONTRAST TECHNIQUE: Contiguous axial images were obtained from the base of the  skull through the vertex without intravenous contrast. RADIATION DOSE REDUCTION: This exam was performed according to the departmental dose-optimization program which includes automated exposure control, adjustment of the mA and/or kV according to patient size and/or use of iterative reconstruction technique. COMPARISON:  08/19/2022 MRI FINDINGS: Brain: The brainstem, cerebellum, cerebral peduncles, thalami, basal ganglia, basilar cisterns, and ventricular system appear within normal limits. No intracranial hemorrhage, mass lesion, or acute CVA. Vascular: Unremarkable Skull: Unremarkable Sinuses/Orbits: Unremarkable Other: No supplemental non-categorized findings. IMPRESSION: 1. No acute intracranial findings. Electronically Signed   By: Gaylyn Rong M.D.   On: 09/17/2022 14:55   CT Cervical Spine Wo Contrast  Result Date: 09/17/2022 CLINICAL DATA:  Head and neck trauma. EXAM: CT CERVICAL SPINE WITHOUT CONTRAST TECHNIQUE: Multidetector CT imaging of the cervical  spine was performed without intravenous contrast. Multiplanar CT image reconstructions were also generated. RADIATION DOSE REDUCTION: This exam was performed according to the departmental dose-optimization program which includes automated exposure control, adjustment of the mA and/or kV according to patient size and/or use of iterative reconstruction technique. COMPARISON:  None Available. FINDINGS: Alignment: 3 mm of anterolisthesis at C3-4 secondary to facet disease. Otherwise normal. Skull base and vertebrae: No evidence of acute fracture or traumatic subluxation. Soft tissues and spinal canal: No prevertebral fluid or swelling. No visible canal hematoma. Disc levels: Multilevel spondylosis. The facet joints at C2-3 appear ankylosed bilaterally. Bilateral facet hypertrophy at C3-4 accounting for the anterolisthesis and contributing to moderate foraminal narrowing on the left. Spondylosis at C4-5 and C5-6 with uncinate spurring and facet hypertrophy contributing to severe foraminal narrowing on the right at C4-5 and mild-to-moderate foraminal narrowing bilaterally at C5-6. There is also mild biforaminal narrowing at C6-7. Upper chest: Left-sided Port-A-Cath in place. Chronic pleural thickening at the right lung apex, similar to prior chest CT 07/06/2022. Other: Bilateral carotid atherosclerosis. IMPRESSION: 1. No evidence of acute cervical spine fracture, traumatic subluxation or static signs of instability. 2. Multilevel cervical spondylosis as described. Electronically Signed   By: Carey Bullocks M.D.   On: 09/17/2022 14:53   DG Chest 2 View  Result Date: 09/16/2022 CLINICAL DATA:  COVID positive. Shortness of breath. Right upper lobe lung cancer. EXAM: CHEST - 2 VIEW COMPARISON:  Chest and right rib radiographs 08/25/2022, chest two views 07/15/2022; CT chest 07/06/2022. FINDINGS: Left chest wall porta catheter tip overlies the central superior vena cava. Cardiac silhouette is again normal in size.  Mediastinal contours are within normal limits. 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 The left lung is clear.  No pleural effusion. Multiple lateral right rib fractures are seen, including the fourth and fifth ribs. Note is made on prior CT there was question of a destructive bone process from superimposed postradiation change and/or tumor involvement. IMPRESSION: 1. 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 2. Multiple lateral right rib fractures, including the fourth and fifth ribs. Note is made on prior CT there was question of overlying postradiation change and/or tumor involvement. Electronically Signed   By: Neita Garnet M.D.   On: 09/16/2022 11:31    ASSESSMENT & PLAN:   Primary cancer of right upper lobe of lung (HCC) # UNRESECTABLE/LOCALLY ADVANCED- Right upper lobe lung mass -concerning for malignancy [biopsy inconclusive/atypical cells]-  April 2023- PD-L1 positive [cirucologene].   May 05, 2022-s/p bronchoscopy- with Dr.Aleskerov-significant  purulent/necrotic debris noted. Dec 23rd, 2023- PET scan: The cavitary process in the right upper lobe is similar in size to previous CTS, although demonstrates prominent peripheral hypermetabolic activity. 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 Hypermetabolic activity within the adjacent upper right lateral chest wall with right 3rd through 5th rib deformities, suspicious for direct tumor spread. No evidence of distant osseous metastatic disease; Hypermetabolic left hilar lymph node worrisome for metastatic disease. No other hypermetabolic lymph nodes or distant metastases identified.   # Discussed with patient and wife that the  differential diagnosis of above findings on PET scan include infection/repeat malignancy.  However given the eqivocal/inconclusive findings noted on PET scan-will review at tumor conference.  Also discussed with Dr.Aleskerov, pulmonary.  For now continue to hold Keytruda. Last [name] was in May-June 2023.     #Right chest wall pain-/pleuritic-rib destruction malignancy versus radiation- OFF fenatny patch 25 mcg.  Oxycodone 5 mg every 8 to 12 hours hours as needed overall-  STABLE; refilled  # COPD/fatigue- [coughing/phlegm/ no fevers]- on albuterol/advair [smoking]- TSH- WNL;Continue  Xopenex/ipratropium nebs-; Mucinex- DM BID.  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 Lemar Livings.  CT OCt 5th, 2023-Filling defect in the cecum near the appendiceal orifice measuring about 3.4 by 2.9 cm, concerning for mass,. DEC 2023- DEC 2023- Hypermetabolic cecal mass suspicious for a villous adenoma or colon cancer causing obstruction of the appendiceal orifice. There is also abnormal dilation of the appendix up to 1.1 cm in diameter, similar to previous.  Patient not too keen on any surgical interventions at this time.  Overall stable.  # Poorly controlled diabetes-blood sugars 113- 280;  Continue glipizide; and  Metformin. STABLE.  # IV mediport: functioning/STABLE  # Hypokalemia-continue potassium 20 mEq liquid.  # Insomnia-continue Lunesta refill.  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   *appt thru mychart   # DISPOSITION:  # today- IVFs over 1 hour.  # IVFs on 1/04   # in 1 week- IVFs-1 lit /1 hourTuesdays/Thursday   # in 2 weeks-  IVFs-1 lit /1 hourTuesdays/Thursday   # in 3 weeks-  IVFs-1 lit /1 hourTuesdays/Thursday   # follow up in 4 weeks-Tuesdays; MD; labs- cbc/cmp'  IVFs-1 lit /1 hour; -  Dr.B         All questions were answered. The patient knows to call the clinic with any problems, questions or concerns.    [name], MD 10/07/2022 8:35 AM

## 2022-10-05 NOTE — Assessment & Plan Note (Addendum)
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   May 05, 2022-s/p bronchoscopy- with Dr.Aleskerov-significant purulent/necrotic debris noted. Dec 23rd, 2023- PET scan: The cavitary process in the right upper lobe is similar in size to previous CTS, although demonstrates prominent peripheral hypermetabolic activity. 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 Hypermetabolic activity within the adjacent upper right lateral chest wall with right 3rd through 5th rib deformities, suspicious for direct tumor spread. No evidence of distant osseous metastatic disease; Hypermetabolic left hilar lymph node worrisome for metastatic disease. No other hypermetabolic lymph nodes or distant metastases identified.   #Recommend continued surveillance without any progression of lung malignancy-especially given the concern for chronic/active infection/inflammation. Last [name] was in May-June 2023.  Given the inconclusive imaging October 2023 I would recommend PET scan.    #Right chest wall pain-/pleuritic-rib destruction malignancy versus radiation- OFF fenatny patch 25 mcg.  Oxycodone 5 mg every 8 to 12 hours hours as needed overall-  STABLE.   # COPD/fatigue- [coughing/phlegm/ no fevers]- on albuterol/advair [smoking]- TSH- WNL;Continue  Xopenex/ipratropium nebs-; Mucinex- DM BID.  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 Bary Castilla.  CT OCt 5th, 2023-Filling defect in the cecum near the appendiceal orifice measuring about 3.4 by 2.9 cm, concerning for mass,. DEC 2023- DEC 9924- Hypermetabolic cecal mass suspicious for a villous adenoma or colon cancer causing obstruction of the  appendiceal orifice. There is also abnormal dilation of the appendix up to 1.1 cm in diameter, similar to previous.  Patient not too keen on any surgical interventions at this time.    # Poorly controlled diabetes-blood sugars 113- 280;  Continue glipizide; and  Metformin. STABLE.  # IV mediport: functioning/STABLE  # Hypokalemia-continue potassium 20 mEq liquid.  # Insomnia-continue Lunesta refill.  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   *appt thru mychart   # DISPOSITION:  # today- IVFs over 1 hour.  # IVFs on 1/04   # in 1 week- IVFs-1 lit /1 hourTuesdays/Thursday   # in 2 weeks-  IVFs-1 lit /1 hourTuesdays/Thursday   # in 3 weeks-  IVFs-1 lit /1 hourTuesdays/Thursday   # follow up in 4 weeks-Tuesdays; MD; labs- cbc/cmp'  IVFs-1 lit /1 hour; - Dr.B

## 2022-10-06 MED FILL — Dexamethasone Sodium Phosphate Inj 100 MG/10ML: INTRAMUSCULAR | Qty: 1 | Status: AC

## 2022-10-07 ENCOUNTER — Inpatient Hospital Stay: Payer: Medicare PPO

## 2022-10-07 ENCOUNTER — Encounter: Payer: Self-pay | Admitting: Internal Medicine

## 2022-10-07 VITALS — BP 104/66 | HR 92 | Temp 95.8°F | Resp 20 | Wt 161.2 lb

## 2022-10-07 DIAGNOSIS — C3411 Malignant neoplasm of upper lobe, right bronchus or lung: Secondary | ICD-10-CM | POA: Diagnosis not present

## 2022-10-07 MED ORDER — HEPARIN SOD (PORK) LOCK FLUSH 100 UNIT/ML IV SOLN
500.0000 [IU] | Freq: Once | INTRAVENOUS | Status: AC | PRN
  Administered 2022-10-07: 500 [IU]
  Filled 2022-10-07: qty 5

## 2022-10-07 MED ORDER — SODIUM CHLORIDE 0.9% FLUSH
10.0000 mL | Freq: Once | INTRAVENOUS | Status: AC | PRN
  Administered 2022-10-07: 10 mL
  Filled 2022-10-07: qty 10

## 2022-10-07 MED ORDER — SODIUM CHLORIDE 0.9 % IV SOLN
Freq: Once | INTRAVENOUS | Status: AC
  Filled 2022-10-07: qty 250

## 2022-10-12 ENCOUNTER — Inpatient Hospital Stay: Payer: Medicare PPO

## 2022-10-12 VITALS — Wt 161.0 lb

## 2022-10-12 DIAGNOSIS — C3411 Malignant neoplasm of upper lobe, right bronchus or lung: Secondary | ICD-10-CM | POA: Diagnosis not present

## 2022-10-12 MED ORDER — SODIUM CHLORIDE 0.9% FLUSH
10.0000 mL | Freq: Once | INTRAVENOUS | Status: AC | PRN
  Administered 2022-10-12: 10 mL
  Filled 2022-10-12: qty 10

## 2022-10-12 MED ORDER — HEPARIN SOD (PORK) LOCK FLUSH 100 UNIT/ML IV SOLN
500.0000 [IU] | Freq: Once | INTRAVENOUS | Status: AC | PRN
  Administered 2022-10-12: 500 [IU]
  Filled 2022-10-12: qty 5

## 2022-10-12 MED ORDER — SODIUM CHLORIDE 0.9 % IV SOLN
Freq: Once | INTRAVENOUS | Status: AC
  Filled 2022-10-12: qty 250

## 2022-10-12 NOTE — Patient Instructions (Signed)
[facility] AT [place]  Discharge Instructions: Thank you for choosing [facility] to provide your oncology and hematology care.  If you have a lab appointment with the [facility], please go directly to the [facility] and check in at the registration area.  Wear comfortable clothing and clothing appropriate for easy access to any Portacath or PICC line.   We strive to give you quality time with your provider. You may need to reschedule your appointment if you arrive late (15 or more minutes).  Arriving late affects you and other patients whose appointments are after yours.  Also, if you miss three or more appointments without notifying the office, you may be dismissed from the clinic at the provider's discretion.      For prescription refill requests, have your pharmacy contact our office and allow 72 hours for refills to be completed.    Today you received IV FLUIDS   To help prevent nausea and vomiting after your treatment, we encourage you to take your nausea medication as directed.  BELOW ARE SYMPTOMS THAT SHOULD BE REPORTED IMMEDIATELY: *FEVER GREATER THAN 100.4 F (38 C) OR HIGHER *CHILLS OR SWEATING *NAUSEA AND VOMITING THAT IS NOT CONTROLLED WITH YOUR NAUSEA MEDICATION *UNUSUAL SHORTNESS OF BREATH *UNUSUAL BRUISING OR BLEEDING *URINARY PROBLEMS (pain or burning when urinating, or frequent urination) *BOWEL PROBLEMS (unusual diarrhea, constipation, pain near the anus) TENDERNESS IN MOUTH AND THROAT WITH OR WITHOUT PRESENCE OF ULCERS (sore throat, sores in mouth, or a toothache) UNUSUAL RASH, SWELLING OR PAIN  UNUSUAL VAGINAL DISCHARGE OR ITCHING   Items with * indicate a potential emergency and should be followed up as soon as possible or go to the Emergency Department if any problems should occur.  Please show the CHEMOTHERAPY ALERT CARD or IMMUNOTHERAPY ALERT CARD at check-in to the Emergency Department and triage nurse.  Should you  have questions after your visit or need to cancel or reschedule your appointment, please contact [facility] [place] AT [place]  [phone] and follow the prompts.  Office hours are 8:00 a.m. to 4:30 p.m. Monday - Friday. Please note that voicemails left after 4:00 p.m. may not be returned until the following business day.  We are closed weekends and major holidays. You have access to a nurse at all times for urgent questions. Please call the main number to the clinic [phone] and follow the prompts.  For any non-urgent questions, you may also contact your provider using MyChart. We now offer e-Visits for anyone 47 and older to request care online for non-urgent symptoms. For details visit [email or web address].   Also download the MyChart app! Go to the app store, search "MyChart", open the app, select [facility], and log in with your MyChart username and password.

## 2022-10-14 ENCOUNTER — Inpatient Hospital Stay: Payer: Medicare PPO

## 2022-10-14 ENCOUNTER — Telehealth: Payer: Self-pay

## 2022-10-14 VITALS — BP 92/61 | HR 102 | Temp 96.1°F | Resp 20 | Wt 163.6 lb

## 2022-10-14 DIAGNOSIS — C3411 Malignant neoplasm of upper lobe, right bronchus or lung: Secondary | ICD-10-CM

## 2022-10-14 MED ORDER — SODIUM CHLORIDE 0.9% FLUSH
10.0000 mL | Freq: Once | INTRAVENOUS | Status: AC | PRN
  Administered 2022-10-14: 10 mL
  Filled 2022-10-14: qty 10

## 2022-10-14 MED ORDER — SODIUM CHLORIDE 0.9 % IV SOLN
Freq: Once | INTRAVENOUS | Status: AC
  Filled 2022-10-14: qty 250

## 2022-10-14 MED ORDER — HEPARIN SOD (PORK) LOCK FLUSH 100 UNIT/ML IV SOLN
500.0000 [IU] | Freq: Once | INTRAVENOUS | Status: AC | PRN
  Administered 2022-10-14: 500 [IU]
  Filled 2022-10-14: qty 5

## 2022-10-14 NOTE — Telephone Encounter (Signed)
Per Dr. Jacinto Reap, pt needs biopsy of the chest wall mass and he will call pt's wife later this afternoon to discuss. Order placed and checklist faxed to specialty scheduling for review. Will notify pt and his wife with appts once scheduled.

## 2022-10-15 ENCOUNTER — Other Ambulatory Visit: Payer: Self-pay | Admitting: Internal Medicine

## 2022-10-15 MED FILL — Dexamethasone Sodium Phosphate Inj 100 MG/10ML: INTRAMUSCULAR | Qty: 1 | Status: AC

## 2022-10-15 NOTE — Progress Notes (Signed)
Discussed with IR- re: chest wall Biopsy; ordered.  I called the wife- unable to reach her- LVM.   GB

## 2022-10-15 NOTE — Telephone Encounter (Signed)
Per secure chat with Dr. Leonard Schwartz, pt needs referral to Dr. Karna Christmas for left hilar adenopathy, evaluate for EBUS and cancel chest wall mass biopsy with IR. Order cancel. Referral faxed to Dr. Terence Lux clinic. Message left with pt's wife to call back to discuss recommendations.

## 2022-10-15 NOTE — Progress Notes (Signed)
Rodney R. Is working on getting bx scheduled.

## 2022-10-18 ENCOUNTER — Inpatient Hospital Stay: Payer: Medicare PPO

## 2022-10-18 VITALS — BP 98/61 | HR 80 | Temp 97.2°F | Resp 20

## 2022-10-18 DIAGNOSIS — C3411 Malignant neoplasm of upper lobe, right bronchus or lung: Secondary | ICD-10-CM

## 2022-10-18 MED ORDER — HEPARIN SOD (PORK) LOCK FLUSH 100 UNIT/ML IV SOLN
500.0000 [IU] | Freq: Once | INTRAVENOUS | Status: AC | PRN
  Administered 2022-10-18: 500 [IU]
  Filled 2022-10-18: qty 5

## 2022-10-18 MED ORDER — SODIUM CHLORIDE 0.9 % IV SOLN
Freq: Once | INTRAVENOUS | Status: AC
  Filled 2022-10-18: qty 250

## 2022-10-18 MED ORDER — SODIUM CHLORIDE 0.9% FLUSH
10.0000 mL | Freq: Once | INTRAVENOUS | Status: AC | PRN
  Administered 2022-10-18: 10 mL
  Filled 2022-10-18: qty 10

## 2022-10-18 NOTE — Telephone Encounter (Addendum)
Spoke with pt's wife and she is aware of recommendations. States pt has an appt scheduled with Dr. Karna Christmas on Thursday to discuss bronchoscopy. Will continue to follow up to make sure pt has follow up with Dr. Leonard Schwartz scheduled after bronch.

## 2022-10-18 NOTE — Patient Instructions (Signed)
Dehydration, Adult Dehydration is condition in which there is not enough water or other fluids in the body. This happens when a person loses more fluids than he or she takes in. Important body parts cannot work right without the right amount of fluids. Any loss of fluids from the body can cause dehydration. Dehydration can be mild, worse, or very bad. It should be treated right away to keep it from getting very bad. What are the causes? This condition may be caused by: Conditions that cause loss of water or other fluids, such as: Watery poop (diarrhea). Vomiting. Sweating a lot. Peeing (urinating) a lot. Not drinking enough fluids, especially when you: Are ill. Are doing things that take a lot of energy to do. Other illnesses and conditions, such as fever or infection. Certain medicines, such as medicines that take extra fluid out of the body (diuretics). Lack of safe drinking water. Not being able to get enough water and food. What increases the risk? The following factors may make you more likely to develop this condition: Having a long-term (chronic) illness that has not been treated the right way, such as: Diabetes. Heart disease. Kidney disease. Being 65 years of age or older. Having a disability. Living in a place that is high above the ground or sea (high in altitude). The thinner, dried air causes more fluid loss. Doing exercises that put stress on your body for a long time. What are the signs or symptoms? Symptoms of dehydration depend on how bad it is. Mild or worse dehydration Thirst. Dry lips or dry mouth. Feeling dizzy or light-headed, especially when you stand up from sitting. Muscle cramps. Your body making: Dark pee (urine). Pee may be the color of tea. Less pee than normal. Less tears than normal. Headache. Very bad dehydration Changes in skin. Skin may: Be cold to the touch (clammy). Be blotchy or pale. Not go back to normal right after you lightly pinch  it and let it go. Little or no tears, pee, or sweat. Changes in vital signs, such as: Fast breathing. Low blood pressure. Weak pulse. Pulse that is more than 100 beats a minute when you are sitting still. Other changes, such as: Feeling very thirsty. Eyes that look hollow (sunken). Cold hands and feet. Being mixed up (confused). Being very tired (lethargic) or having trouble waking from sleep. Short-term weight loss. Loss of consciousness. How is this treated? Treatment for this condition depends on how bad it is. Treatment should start right away. Do not wait until your condition gets very bad. Very bad dehydration is an emergency. You will need to go to a hospital. Mild or worse dehydration can be treated at home. You may be asked to: Drink more fluids. Drink an oral rehydration solution (ORS). This drink helps get the right amounts of fluids and salts and minerals in the blood (electrolytes). Very bad dehydration can be treated: With fluids through an IV tube. By getting normal levels of salts and minerals in your blood. This is often done by giving salts and minerals through a tube. The tube is passed through your nose and into your stomach. By treating the root cause. Follow these instructions at home: Oral rehydration solution If told by your doctor, drink an ORS: Make an ORS. Use instructions on the package. Start by drinking small amounts, about  cup (120 mL) every 5-10 minutes. Slowly drink more until you have had the amount that your doctor said to have. Eating and drinking          Drink enough clear fluid to keep your pee pale yellow. If you were told to drink an ORS, finish the ORS first. Then, start slowly drinking other clear fluids. Drink fluids such as: Water. Do not drink only water. Doing that can make the salt (sodium) level in your body get too low. Water from ice chips you suck on. Fruit juice that you have added water to (diluted). Low-calorie sports  drinks. Eat foods that have the right amounts of salts and minerals, such as: Bananas. Oranges. Potatoes. Tomatoes. Spinach. Do not drink alcohol. Avoid: Drinks that have a lot of sugar. These include: High-calorie sports drinks. Fruit juice that you did not add water to. Soda. Caffeine. Foods that are greasy or have a lot of fat or sugar. General instructions Take over-the-counter and prescription medicines only as told by your doctor. Do not take salt tablets. Doing that can make the salt level in your body get too high. Return to your normal activities as told by your doctor. Ask your doctor what activities are safe for you. Keep all follow-up visits as told by your doctor. This is important. Contact a doctor if: You have pain in your belly (abdomen) and the pain: Gets worse. Stays in one place. You have a rash. You have a stiff neck. You get angry or annoyed (irritable) more easily than normal. You are more tired or have a harder time waking than normal. You feel: Weak or dizzy. Very thirsty. Get help right away if you have: Any symptoms of very bad dehydration. Symptoms of vomiting, such as: You cannot eat or drink without vomiting. Your vomiting gets worse or does not go away. Your vomit has blood or green stuff in it. Symptoms that get worse with treatment. A fever. A very bad headache. Problems with peeing or pooping (having a bowel movement), such as: Watery poop that gets worse or does not go away. Blood in your poop (stool). This may cause poop to look black and tarry. Not peeing in 6-8 hours. Peeing only a small amount of very dark pee in 6-8 hours. Trouble breathing. These symptoms may be an emergency. Do not wait to see if the symptoms will go away. Get medical help right away. Call your local emergency services (911 in the [place] Do not drive yourself to the hospital. Summary Dehydration is a condition in which there is not enough water or other fluids  in the body. This happens when a person loses more fluids than he or she takes in. Treatment for this condition depends on how bad it is. Treatment should be started right away. Do not wait until your condition gets very bad. Drink enough clear fluid to keep your pee pale yellow. If you were told to drink an oral rehydration solution (ORS), finish the ORS first. Then, start slowly drinking other clear fluids. Take over-the-counter and prescription medicines only as told by your doctor. Get help right away if you have any symptoms of very bad dehydration. This information is not intended to replace advice given to you by your health care provider. Make sure you discuss any questions you have with your health care provider. Document Revised: 01/27/2022 Document Reviewed: 05/03/2019 Elsevier Patient Education  2023 Elsevier Inc.  

## 2022-10-19 ENCOUNTER — Inpatient Hospital Stay: Payer: Medicare PPO

## 2022-10-21 ENCOUNTER — Inpatient Hospital Stay: Payer: Medicare PPO

## 2022-10-21 ENCOUNTER — Inpatient Hospital Stay
Admission: RE | Admit: 2022-10-21 | Discharge: 2022-10-21 | Disposition: A | Discharge status: Home / Self Care | Payer: Medicare PPO | Source: Ambulatory Visit

## 2022-10-21 VITALS — BP 113/71 | HR 94 | Temp 97.2°F | Resp 20 | Wt 166.1 lb

## 2022-10-21 DIAGNOSIS — C3411 Malignant neoplasm of upper lobe, right bronchus or lung: Secondary | ICD-10-CM

## 2022-10-21 HISTORY — DX: Dyspnea, unspecified: R06.00

## 2022-10-21 HISTORY — DX: Malignant neoplasm of unspecified part of unspecified bronchus or lung: C34.90

## 2022-10-21 HISTORY — DX: Cannabis use, unspecified, uncomplicated: F12.90

## 2022-10-21 HISTORY — DX: Hypokalemia: E87.6

## 2022-10-21 HISTORY — DX: Colostomy status: Z93.3

## 2022-10-21 HISTORY — DX: Other specified diseases of intestine: K63.89

## 2022-10-21 HISTORY — DX: Calculus of kidney: N20.0

## 2022-10-21 HISTORY — DX: Sepsis, unspecified organism: A41.9

## 2022-10-21 HISTORY — DX: Tobacco use: Z72.0

## 2022-10-21 HISTORY — DX: Acute and chronic respiratory failure, unspecified whether with hypoxia or hypercapnia: J96.20

## 2022-10-21 HISTORY — DX: Malignant neoplasm of upper lobe, right bronchus or lung: C34.11

## 2022-10-21 HISTORY — DX: Severe sepsis without septic shock: R65.20

## 2022-10-21 MED ORDER — SODIUM CHLORIDE 0.9% FLUSH
10.0000 mL | Freq: Once | INTRAVENOUS | Status: AC | PRN
  Administered 2022-10-21: 10 mL
  Filled 2022-10-21: qty 10

## 2022-10-21 MED ORDER — HEPARIN SOD (PORK) LOCK FLUSH 100 UNIT/ML IV SOLN
500.0000 [IU] | Freq: Once | INTRAVENOUS | Status: AC | PRN
  Administered 2022-10-21: 500 [IU]
  Filled 2022-10-21: qty 5

## 2022-10-21 MED ORDER — SODIUM CHLORIDE 0.9 % IV SOLN
Freq: Once | INTRAVENOUS | Status: AC
  Filled 2022-10-21: qty 250

## 2022-10-22 ENCOUNTER — Encounter
Admission: RE | Admit: 2022-10-22 | Discharge: 2022-10-22 | Disposition: A | Discharge status: Home / Self Care | Payer: Medicare PPO | Source: Ambulatory Visit | Attending: Pulmonary Disease | Admitting: Pulmonary Disease

## 2022-10-22 DIAGNOSIS — Z20822 Contact with and (suspected) exposure to covid-19: Secondary | ICD-10-CM

## 2022-10-22 DIAGNOSIS — R0602 Shortness of breath: Secondary | ICD-10-CM

## 2022-10-22 DIAGNOSIS — Z01818 Encounter for other preprocedural examination: Secondary | ICD-10-CM

## 2022-10-22 DIAGNOSIS — Z01812 Encounter for preprocedural laboratory examination: Secondary | ICD-10-CM

## 2022-10-22 HISTORY — DX: Presence of other vascular implants and grafts: Z95.828

## 2022-10-22 HISTORY — DX: Personal history of Methicillin resistant Staphylococcus aureus infection: Z86.14

## 2022-10-22 NOTE — Patient Instructions (Addendum)
Your procedure is scheduled on:10-29-22 Friday Report to the Registration Desk on the 1st floor of the [facility].Then proceed to the 2nd floor Surgery Desk To find out your arrival time, please call [phone] between 1PM - 3PM on:10-28-22 Thursday If your arrival time is 6:00 am, do not arrive prior to that time as the [facility] entrance doors do not open until 6:00 am.  REMEMBER: Instructions that are not followed completely may result in serious medical risk, up to and including death; or upon the discretion of your surgeon and anesthesiologist your surgery may need to be rescheduled.  Do not eat food OR drink any liquids after midnight the night before surgery.  No gum chewing, lozengers or hard candies.  TAKE THESE MEDICATIONS THE MORNING OF SURGERY WITH A SIP OF WATER: -oxyCODONE (OXY IR/ROXICODONE)  -pregabalin (LYRICA)  -propranolol ER (INDERAL LA)  -venlafaxine XR (EFFEXOR-XR)  Use your  albuterol (PROVENTIL) (2.5 MG/3ML) 0.083% nebulizer solution and your BREZTRI AEROSPHERE Inhaler the day of surgery and bring your Albuterol Inhaler to the hospital  Stop your metFORMIN (GLUCOPHAGE) 2 days prior to surgery-Last dose will be on 10-26-22 Tuesday  One week prior to surgery: Stop Anti-inflammatories (NSAIDS) such as Advil, Aleve, Ibuprofen, Motrin, Naproxen, Naprosyn and Aspirin based products such as Excedrin, Goodys Powder, BC Powder.You may however, continue to take Tylenol/Oxycodone if needed for pain up until the day of surgery.  Stop ANY OVER THE COUNTER supplements/vitamin NOW (10-22-22)until after surgery (Vitamin B12 and Multivitamin)  No Alcohol for 24 hours before or after surgery.  No Smoking including e-cigarettes for 24 hours prior to surgery.  No chewable tobacco products for at least 6 hours prior to surgery.  No nicotine patches on the day of surgery.  Do not use any "recreational" drugs for at least a week prior to your surgery.  Please be advised that  the combination of cocaine and anesthesia may have negative outcomes, up to and including death. If you test positive for cocaine, your surgery will be cancelled.  On the morning of surgery brush your teeth with toothpaste and water, you may rinse your mouth with mouthwash if you wish. Do not swallow any toothpaste or mouthwash.  Do not wear jewelry, make-up, hairpins, clips or nail polish.  Do not wear lotions, powders, or perfumes.   Do not shave body from the neck down 48 hours prior to surgery just in case you cut yourself which could leave a site for infection.  Also, freshly shaved skin may become irritated if using the CHG soap.  Contact lenses, hearing aids and dentures may not be worn into surgery.  Do not bring valuables to the hospital. [facility] is not responsible for any missing/lost belongings or valuables.    Notify your doctor if there is any change in your medical condition (cold, fever, infection).  Wear comfortable clothing (specific to your surgery type) to the hospital.  After surgery, you can help prevent lung complications by doing breathing exercises.  Take deep breaths and cough every 1-2 hours. Your doctor may order a device called an Incentive Spirometer to help you take deep breaths. When coughing or sneezing, hold a pillow firmly against your incision with both hands. This is called "splinting." Doing this helps protect your incision. It also decreases belly discomfort.  If you are being admitted to the hospital overnight, leave your suitcase in the car. After surgery it may be brought to your room.  If you are being discharged the day  of surgery, you will not be allowed to drive home. You will need a responsible adult (18 years or older) to drive you home and stay with you that night.   If you are taking public transportation, you will need to have a responsible adult (18 years or older) with you. Please confirm with your physician that it is acceptable  to use public transportation.   Please call the Pre-admissions Testing Dept. at [phone] if you have any questions about these instructions.  Surgery Visitation Policy:  Patients undergoing a surgery or procedure may have two family members or support persons with them as long as the person is not COVID-19 positive or experiencing its symptoms.   Due to an increase in RSV and influenza rates and associated hospitalizations, children ages 39 and under will not be able to visit patients in [facility]. Masks continue to be strongly recommended.

## 2022-10-26 ENCOUNTER — Inpatient Hospital Stay: Payer: Medicare PPO

## 2022-10-26 VITALS — BP 118/76 | HR 93 | Temp 95.9°F | Resp 20

## 2022-10-26 DIAGNOSIS — C3411 Malignant neoplasm of upper lobe, right bronchus or lung: Secondary | ICD-10-CM | POA: Diagnosis not present

## 2022-10-26 MED ORDER — SODIUM CHLORIDE 0.9 % IV SOLN
Freq: Once | INTRAVENOUS | Status: AC
  Filled 2022-10-26: qty 250

## 2022-10-26 MED ORDER — HEPARIN SOD (PORK) LOCK FLUSH 100 UNIT/ML IV SOLN
500.0000 [IU] | Freq: Once | INTRAVENOUS | Status: AC | PRN
  Administered 2022-10-26: 500 [IU]
  Filled 2022-10-26: qty 5

## 2022-10-26 MED ORDER — SODIUM CHLORIDE 0.9% FLUSH
10.0000 mL | Freq: Once | INTRAVENOUS | Status: AC | PRN
  Administered 2022-10-26: 10 mL
  Filled 2022-10-26: qty 10

## 2022-10-26 NOTE — Telephone Encounter (Signed)
Pt scheduled for bronch on 1/26 and has upcoming appt with Dr. Leonard Schwartz on 1/30. Nothing further needed at this time.

## 2022-10-27 ENCOUNTER — Encounter
Admission: RE | Admit: 2022-10-27 | Discharge: 2022-10-27 | Disposition: A | Discharge status: Home / Self Care | Payer: Medicare PPO | Source: Ambulatory Visit | Attending: Pulmonary Disease | Admitting: Pulmonary Disease

## 2022-10-27 ENCOUNTER — Encounter: Payer: Self-pay | Admitting: Urgent Care

## 2022-10-27 ENCOUNTER — Inpatient Hospital Stay: Admission: RE | Admit: 2022-10-27 | Payer: Medicare PPO | Source: Ambulatory Visit

## 2022-10-27 DIAGNOSIS — R0602 Shortness of breath: Secondary | ICD-10-CM | POA: Insufficient documentation

## 2022-10-27 DIAGNOSIS — Z1152 Encounter for screening for COVID-19: Secondary | ICD-10-CM | POA: Insufficient documentation

## 2022-10-27 DIAGNOSIS — Z01812 Encounter for preprocedural laboratory examination: Secondary | ICD-10-CM | POA: Insufficient documentation

## 2022-10-27 LAB — PROTIME-INR
INR: 1.1 (ref 0.8–1.2)
Prothrombin Time: 14 seconds (ref 11.4–15.2)

## 2022-10-27 LAB — SARS CORONAVIRUS 2 (TAT 6-24 HRS): SARS Coronavirus 2: NEGATIVE

## 2022-10-27 LAB — APTT: aPTT: 35 seconds (ref 24–36)

## 2022-10-28 ENCOUNTER — Inpatient Hospital Stay: Payer: Medicare PPO

## 2022-10-28 VITALS — BP 112/67 | HR 98 | Temp 97.2°F | Resp 20 | Wt 166.0 lb

## 2022-10-28 DIAGNOSIS — C3411 Malignant neoplasm of upper lobe, right bronchus or lung: Secondary | ICD-10-CM | POA: Diagnosis not present

## 2022-10-28 MED ORDER — SODIUM CHLORIDE 0.9% FLUSH
10.0000 mL | Freq: Once | INTRAVENOUS | Status: AC | PRN
  Administered 2022-10-28: 10 mL
  Filled 2022-10-28: qty 10

## 2022-10-28 MED ORDER — CHLORHEXIDINE GLUCONATE 0.12 % MT SOLN
15.0000 mL | Freq: Once | OROMUCOSAL | Status: AC
  Administered 2022-10-29: 15 mL via OROMUCOSAL

## 2022-10-28 MED ORDER — ORAL CARE MOUTH RINSE: 15.0000 mL | Freq: Once | OROMUCOSAL | Status: AC

## 2022-10-28 MED ORDER — HEPARIN SOD (PORK) LOCK FLUSH 100 UNIT/ML IV SOLN
500.0000 [IU] | Freq: Once | INTRAVENOUS | Status: AC | PRN
  Administered 2022-10-28: 500 [IU]
  Filled 2022-10-28: qty 5

## 2022-10-28 MED ORDER — SODIUM CHLORIDE 0.9 % IV SOLN: INTRAVENOUS | Status: DC

## 2022-10-28 MED ORDER — SODIUM CHLORIDE 0.9 % IV SOLN
Freq: Once | INTRAVENOUS | Status: AC
  Filled 2022-10-28: qty 250

## 2022-10-29 ENCOUNTER — Ambulatory Visit
Admission: RE | Admit: 2022-10-29 | Discharge: 2022-10-29 | Disposition: A | Discharge status: Home / Self Care | Payer: Medicare PPO | Attending: Pulmonary Disease | Admitting: Pulmonary Disease

## 2022-10-29 ENCOUNTER — Other Ambulatory Visit: Payer: Self-pay

## 2022-10-29 ENCOUNTER — Encounter
Admission: RE | Disposition: A | Discharge status: Home / Self Care | Payer: Self-pay | Source: Home / Self Care | Attending: Pulmonary Disease

## 2022-10-29 ENCOUNTER — Ambulatory Visit: Payer: Medicare PPO | Admitting: Anesthesiology

## 2022-10-29 ENCOUNTER — Encounter: Payer: Self-pay | Admitting: *Deleted

## 2022-10-29 ENCOUNTER — Ambulatory Visit: Payer: Medicare PPO

## 2022-10-29 DIAGNOSIS — Z01818 Encounter for other preprocedural examination: Secondary | ICD-10-CM

## 2022-10-29 DIAGNOSIS — F17210 Nicotine dependence, cigarettes, uncomplicated: Secondary | ICD-10-CM | POA: Insufficient documentation

## 2022-10-29 DIAGNOSIS — R918 Other nonspecific abnormal finding of lung field: Secondary | ICD-10-CM | POA: Insufficient documentation

## 2022-10-29 DIAGNOSIS — E119 Type 2 diabetes mellitus without complications: Secondary | ICD-10-CM | POA: Insufficient documentation

## 2022-10-29 DIAGNOSIS — J449 Chronic obstructive pulmonary disease, unspecified: Secondary | ICD-10-CM | POA: Insufficient documentation

## 2022-10-29 DIAGNOSIS — I10 Essential (primary) hypertension: Secondary | ICD-10-CM | POA: Diagnosis not present

## 2022-10-29 DIAGNOSIS — Z85118 Personal history of other malignant neoplasm of bronchus and lung: Secondary | ICD-10-CM | POA: Diagnosis not present

## 2022-10-29 DIAGNOSIS — J984 Other disorders of lung: Secondary | ICD-10-CM | POA: Insufficient documentation

## 2022-10-29 HISTORY — PX: VIDEO BRONCHOSCOPY WITH ENDOBRONCHIAL ULTRASOUND: SHX6177

## 2022-10-29 HISTORY — PX: RIGID BRONCHOSCOPY: SHX5069

## 2022-10-29 LAB — GLUCOSE, CAPILLARY
Glucose-Capillary: 86 mg/dL (ref 70–99)
Glucose-Capillary: 112 mg/dL — ABNORMAL HIGH (ref 70–99)

## 2022-10-29 SURGERY — BRONCHOSCOPY, WITH EBUS
Anesthesia: General

## 2022-10-29 MED ORDER — ROCURONIUM BROMIDE 100 MG/10ML IV SOLN
INTRAVENOUS | Status: DC | PRN
  Administered 2022-10-29 (×2): 40 mg via INTRAVENOUS

## 2022-10-29 MED ORDER — SUGAMMADEX SODIUM 500 MG/5ML IV SOLN
INTRAVENOUS | Status: AC
  Filled 2022-10-29: qty 5

## 2022-10-29 MED ORDER — MIDAZOLAM HCL 2 MG/2ML IJ SOLN
INTRAMUSCULAR | Status: AC
  Filled 2022-10-29: qty 2

## 2022-10-29 MED ORDER — MIDAZOLAM HCL 2 MG/2ML IJ SOLN
INTRAMUSCULAR | Status: DC | PRN
  Administered 2022-10-29: 1 mg via INTRAVENOUS

## 2022-10-29 MED ORDER — VASOPRESSIN 20 UNIT/ML IV SOLN
INTRAVENOUS | Status: AC
  Filled 2022-10-29: qty 1

## 2022-10-29 MED ORDER — KETAMINE HCL 50 MG/5ML IJ SOSY
PREFILLED_SYRINGE | INTRAMUSCULAR | Status: AC
  Filled 2022-10-29: qty 5

## 2022-10-29 MED ORDER — HYDROMORPHONE HCL 1 MG/ML IJ SOLN: 0.2500 mg | INTRAMUSCULAR | Status: DC | PRN

## 2022-10-29 MED ORDER — FENTANYL CITRATE (PF) 100 MCG/2ML IJ SOLN
INTRAMUSCULAR | Status: DC | PRN
  Administered 2022-10-29: 25 ug via INTRAVENOUS
  Administered 2022-10-29: 75 ug via INTRAVENOUS

## 2022-10-29 MED ORDER — PHENYLEPHRINE HCL (PRESSORS) 10 MG/ML IV SOLN
INTRAVENOUS | Status: DC | PRN
  Administered 2022-10-29: 160 ug via INTRAVENOUS
  Administered 2022-10-29 (×2): 240 ug via INTRAVENOUS

## 2022-10-29 MED ORDER — VASOPRESSIN 20 UNIT/ML IV SOLN
INTRAVENOUS | Status: DC | PRN
  Administered 2022-10-29 (×2): 1 [IU] via INTRAVENOUS
  Administered 2022-10-29 (×3): 2 [IU] via INTRAVENOUS

## 2022-10-29 MED ORDER — KETAMINE HCL 10 MG/ML IJ SOLN
INTRAMUSCULAR | Status: DC | PRN
  Administered 2022-10-29: 30 mg via INTRAVENOUS
  Administered 2022-10-29: 20 mg via INTRAVENOUS

## 2022-10-29 MED ORDER — FENTANYL CITRATE (PF) 100 MCG/2ML IJ SOLN
INTRAMUSCULAR | Status: AC
  Filled 2022-10-29: qty 2

## 2022-10-29 MED ORDER — EPHEDRINE SULFATE (PRESSORS) 50 MG/ML IJ SOLN
INTRAMUSCULAR | Status: DC | PRN
  Administered 2022-10-29: 15 mg via INTRAVENOUS
  Administered 2022-10-29: 10 mg via INTRAVENOUS
  Administered 2022-10-29: 15 mg via INTRAVENOUS
  Administered 2022-10-29: 10 mg via INTRAVENOUS

## 2022-10-29 MED ORDER — PROPOFOL 10 MG/ML IV BOLUS
INTRAVENOUS | Status: DC | PRN
  Administered 2022-10-29: 120 mg via INTRAVENOUS
  Administered 2022-10-29: 100 mg via INTRAVENOUS

## 2022-10-29 MED ORDER — PROPOFOL 500 MG/50ML IV EMUL
INTRAVENOUS | Status: DC | PRN
  Administered 2022-10-29: 150 ug/kg/min via INTRAVENOUS

## 2022-10-29 MED ORDER — LIDOCAINE HCL (PF) 2 % IJ SOLN
INTRAMUSCULAR | Status: AC
  Filled 2022-10-29: qty 5

## 2022-10-29 MED ORDER — PHENYLEPHRINE HCL-NACL 20-0.9 MG/250ML-% IV SOLN
INTRAVENOUS | Status: DC | PRN
  Administered 2022-10-29: 50 ug/min via INTRAVENOUS

## 2022-10-29 MED ORDER — PHENYLEPHRINE HCL (PRESSORS) 10 MG/ML IV SOLN
INTRAVENOUS | Status: AC
  Filled 2022-10-29: qty 1

## 2022-10-29 MED ORDER — PHENYLEPHRINE 80 MCG/ML (10ML) SYRINGE FOR IV PUSH (FOR BLOOD PRESSURE SUPPORT): PREFILLED_SYRINGE | INTRAVENOUS | Status: DC | PRN

## 2022-10-29 MED ORDER — ONDANSETRON HCL 4 MG/2ML IJ SOLN
INTRAMUSCULAR | Status: AC
  Filled 2022-10-29: qty 2

## 2022-10-29 MED ORDER — ONDANSETRON HCL 4 MG/2ML IJ SOLN
INTRAMUSCULAR | Status: DC | PRN
  Administered 2022-10-29: 4 mg via INTRAVENOUS

## 2022-10-29 MED ORDER — OXYCODONE HCL 5 MG/5ML PO SOLN: 5.0000 mg | Freq: Once | ORAL | Status: DC | PRN

## 2022-10-29 MED ORDER — LIDOCAINE HCL (CARDIAC) PF 100 MG/5ML IV SOSY
PREFILLED_SYRINGE | INTRAVENOUS | Status: DC | PRN
  Administered 2022-10-29: 80 mg via INTRAVENOUS

## 2022-10-29 MED ORDER — DEXAMETHASONE SODIUM PHOSPHATE 10 MG/ML IJ SOLN
INTRAMUSCULAR | Status: DC | PRN
  Administered 2022-10-29: 5 mg via INTRAVENOUS

## 2022-10-29 MED ORDER — OXYCODONE HCL 5 MG PO TABS: 5.0000 mg | ORAL_TABLET | Freq: Once | ORAL | Status: DC | PRN

## 2022-10-29 MED ORDER — SUGAMMADEX SODIUM 500 MG/5ML IV SOLN
INTRAVENOUS | Status: DC | PRN
  Administered 2022-10-29: 300 mg via INTRAVENOUS

## 2022-10-29 MED ORDER — ROCURONIUM BROMIDE 10 MG/ML (PF) SYRINGE
PREFILLED_SYRINGE | INTRAVENOUS | Status: AC
  Filled 2022-10-29: qty 10

## 2022-10-29 MED ORDER — PROPOFOL 1000 MG/100ML IV EMUL
INTRAVENOUS | Status: AC
  Filled 2022-10-29: qty 100

## 2022-10-29 NOTE — Anesthesia Procedure Notes (Signed)
Procedure Name: Intubation Date/Time: 10/29/2022 1:11 PM  Performed by: Lia Foyer, CRNAPre-anesthesia Checklist: Patient identified, Emergency Drugs available, Suction available and Patient being monitored Patient Re-evaluated:Patient Re-evaluated prior to induction Oxygen Delivery Method: Circle system utilized Preoxygenation: Pre-oxygenation with 100% oxygen Induction Type: IV induction Ventilation: Mask ventilation without difficulty Laryngoscope Size: McGraph and 4 Grade View: Grade II Tube type: Oral Tube size: 8.5 mm Number of attempts: 1 Airway Equipment and Method: Stylet, Oral airway and Video-laryngoscopy Placement Confirmation: ETT inserted through vocal cords under direct vision, positive ETCO2 and breath sounds checked- equal and bilateral Secured at: 23 cm Tube secured with: Tape Dental Injury: Teeth and Oropharynx as per pre-operative assessment

## 2022-10-29 NOTE — Transfer of Care (Signed)
Immediate Anesthesia Transfer of Care Note  Patient: Rodney Vega  Procedure(s) Performed: VIDEO BRONCHOSCOPY WITH ENDOBRONCHIAL ULTRASOUND RIGID BRONCHOSCOPY  Patient Location: PACU  Anesthesia Type:General  Level of Consciousness: drowsy  Airway & Oxygen Therapy: Patient Spontanous Breathing and Patient connected to face mask oxygen  Post-op Assessment: Report given to RN and Post -op Vital signs reviewed and stable  Post vital signs: Reviewed and stable  Last Vitals:  Vitals Value Taken Time  BP 105/64 10/29/22 1433  Temp    Pulse 79 10/29/22 1436  Resp 22 10/29/22 1436  SpO2 99 % 10/29/22 1436  Vitals shown include unvalidated device data.  Last Pain:  Vitals:   10/29/22 1146  TempSrc: Tympanic  PainSc: 0-No pain         Complications: No notable events documented.

## 2022-10-29 NOTE — Procedures (Signed)
ENDOBRONCHIAL ULTRASOUND PROCEDURE NOTE   THERAPEUTIC ASPIRATION OF TRACHEOBRONCHIAL TREE PROCEDURE NOTE  FIBEROPTIC BRONCHOSCOPY WITH BRONCHOALVEOLAR LAVAGE PROCEDURE NOTE   Flexible bronchoscopy was performed  by : Lanney Gins MD  assistance by : 1)Repiratory therapist  and 2)[employer] staff and 3) Anesthesia team and 4) Flouroscopy team and 5) [employer] supporting staff   Indication for the procedure was :  Pre-procedural H&P. The following assessment was performed on the day of the procedure prior to initiating sedation History:  Chest pain n Dyspnea y Hemoptysis n Cough y Fever n Other pertinent items n  Examination Vital signs -reviewed as per nursing documentation today Cardiac    Murmurs: n  Rubs : n  Gallop: n Lungs Wheezing: n Rales : n Rhonchi :y  Other pertinent findings: SOB/hypoxemia due to chronic lung disease   Pre-procedural assessment for Procedural Sedation included: Depth of sedation: As per anesthesia team  ASA Classification:  2 Mallampati airway assessment: 3    Medication list reviewed: y  The patient's interval history was taken and revealed: no new complaints The pre- procedure physical examination revealed: No new findings Refer to prior clinic note for details.  Informed Consent: Informed consent was obtained from:  patient after explanation of procedure and risks, benefits, as well as alternative procedures available.  Explanation of level of sedation and possible transfusion was also provided.    Procedural Preparation: Time out was performed and patient was identified by name and birthdate and procedure to be performed and side for sampling, if any, was specified. Pt was intubated by anesthesia.  The patient was appropriately draped.   Fiberoptic bronchoscopy with airway inspection and BAL Procedure findings:  Bronchoscope was inserted via ETT  without difficulty.  Posterior oropharynx, epiglottis, arytenoids, false  cords and vocal cords were not visualized as these were bypassed by endotracheal tube. The distal trachea was normal in circumference and appearance without mucosal, cartilaginous or branching abnormalities.  The main carina was mildly splayed . All right and left lobar airways were visualized to the Subsegmental level.  Sub- sub segmental carinae were identified in all the distal airways.   Secretions were visible in the following airways and appeared to be clear.  The mucosa was : friable at RUL WITH CAVITY  Airways were notable for:        exophytic lesions :n       extrinsic compression in the following distributions: n.       Friable mucosa: y       Neurosurgeon /pigmentation: n     Post procedure Diagnosis:   MUCUS PLUGGING BILATERALY.  EXTRINSIC COMPRESSION OF RML        Endobronchial ultrasound assisted hilar and mediastinal lymph node biopsies procedure findings: The fiberoptic bronchoscope was removed and the EBUS scope was introduced. Examination began to evaluate for pathologically enlarged lymph nodes starting on the RIGHT  side progressing to the LEFT side.  All lymph node biopsies performed with 21G needle. Lymph node biopsies were sent in cytolite for all stations.   Post procedure diagnosis:  L4 STATION IS ENLARGED AND WAS BIOPSIED 4 TIMES    Specimens obtained included:  Bronchial washings site: RML  sent for: MICRO AND CYTOLOGY         Immediate sampling complications included:NONE  Epinephrine ZERO ml was used topically  The bronchoscopy was terminated due to completion of the planned procedure and the bronchoscope was removed.   Total dosage of Lidocaine was 98m Total fluoroscopy time was ZERO  minutes  Supplemental oxygen was provided at AS PER ANESTHESIA  lpm by nasal canula post operatively  Estimated Blood loss: <1CC cc.  Complications included:  NONE   Preliminary CXR findings :  IN PROCSS   Disposition: MET WITH WIFE POST PROCEDURE  DISCUSSED FINDINGS AND SHE KNOWS PATH WILL TAKE UP TO 1 WK  Follow up with Dr. Lanney Gins in 5 days for result discussion.     Ottie Glazier MD  [place] Division of Pulmonary & Critical Care Medicine

## 2022-10-29 NOTE — H&P (Signed)
PULMONOLOGY         Date: 10/29/2022,   MRN# [id number] Quintez Maselli July 02, 1956     AdmissionWeight: 75.3 kg                 CurrentWeight: 75.3 kg    CHIEF COMPLAINT:   Right upper lobe cavitary mass with hypermetabolic L4 lymph node staion   HISTORY OF PRESENT ILLNESS   This is a 67 yo M with hx of chronic anemia, lung cancer s/p core biopsy with necrotic tissue + for p40 suggestive of sq cell carcinoma.  Had seen oncology with [place] abraxane chemo initiated. Also had incidentally noted cecal mass with colonoscopy findings of dysplasia suggestive of cancer and had seen surgery but resection on hold. He has andvanced COPD has been on antibiotics recurrently with prednisone and had some improvement but not enough to allow adequate functional improvement.  He still smokes daily but attempting to quit.  Today he is here for hypermetabolic L4 lymph node station on repeat PET scan and will be undergoing broncocopy with endobronchial ultrasound.  Reviewed risks/complications and benefits with patient, risks include infection, pneumothorax/pneumomediastinum which may require chest tube placement as well as overnight/prolonged hospitalization and possible mechanical ventilation. Other risks include bleeding and very rarely death.  Patient understands risks and wishes to proceed.  Additional questions were answered, and patient is aware that post procedure patient will be going home with family and may experience cough with possible clots on expectoration as well as phlegm which may last few days as well as hoarseness of voice post intubation and mechanical ventilation.    PAST MEDICAL HISTORY   Past Medical History:  Diagnosis Date   Acute on chronic respiratory failure (HCC)    Anemia    Cancer ([place])    Colostomy present ([place])    COPD (chronic obstructive pulmonary disease) ([place])    Degenerative arthritis of knee    Depression    Diabetes mellitus without complication ([place])     Diverticulitis    Dyspnea    Glaucoma    since 2004   Hernia 1979   History of kidney stones    History of methicillin resistant staphylococcus aureus (MRSA)    Hypertension    Hypokalemia    Marijuana use    Mass of cecum    Nephrolithiasis    Neuromuscular disorder ([place])    Personal history of tobacco use, presenting hazards to health    Pneumonia    Port-A-Cath in place    Pre-diabetes    Primary cancer of right upper lobe of lung ([place])    SCC of lung (small cell carcinoma) ([place])    Screening for obesity    Severe sepsis ([place])    Special screening for malignant neoplasms, colon    Tobacco use    Wears dentures    full upper and lower     SURGICAL HISTORY   Past Surgical History:  Procedure Laterality Date   [place], 2012   lumbar bulging disc   BRONCHOSCOPY  05/05/2022   CATARACT EXTRACTION W/PHACO Left 01/20/2021   Procedure: CATARACT EXTRACTION PHACO AND INTRAOCULAR LENS PLACEMENT ([place]) LEFT;  Surgeon: Birder Robson, MD;  Location: [place];  Service: Ophthalmology;  Laterality: Left;  10.13 0:52.1   COLONOSCOPY  [id number],[id number]   [facility]/Dr. Buelah Manis sessile polyp,41m, found in rectum,multiple diverticula were found in the sigmoid, in descending and in transverse colon   COLONOSCOPY WITH PROPOFOL N/A 10/22/2021   Procedure:  COLONOSCOPY WITH PROPOFOL;  Surgeon: Annamaria Helling, DO;  Location: [facility] ENDOSCOPY;  Service: Gastroenterology;  Laterality: N/A;  DM   COLOSTOMY  04/20/2013   EYE SURGERY     HERNIA REPAIR  1979   inguinal   IR IMAGING GUIDED PORT INSERTION  06/10/2021   JOINT REPLACEMENT Right    knee   KNEE ARTHROPLASTY Right 05/08/2018   Procedure: COMPUTER ASSISTED TOTAL KNEE ARTHROPLASTY;  Surgeon: Dereck Leep, MD;  Location: [facility];  Service: Orthopedics;  Laterality: Right;   KNEE ARTHROPLASTY Left 11/16/2019   Procedure: COMPUTER ASSISTED TOTAL KNEE ARTHROPLASTY;  Surgeon: Dereck Leep, MD;  Location: [facility];   Service: Orthopedics;  Laterality: Left;   LAPAROTOMY  04/20/2013   colon resection with colosotmy   LITHOTRIPSY  2013   LUNG BIOPSY  2023   TONSILLECTOMY AND ADENOIDECTOMY  1962   VIDEO BRONCHOSCOPY WITH ENDOBRONCHIAL NAVIGATION N/A 05/05/2022   Procedure: VIDEO BRONCHOSCOPY WITH ENDOBRONCHIAL NAVIGATION;  Surgeon: Ottie Glazier, MD;  Location: [facility];  Service: Thoracic;  Laterality: N/A;   VIDEO BRONCHOSCOPY WITH ENDOBRONCHIAL ULTRASOUND N/A 05/05/2022   Procedure: VIDEO BRONCHOSCOPY WITH ENDOBRONCHIAL ULTRASOUND;  Surgeon: Ottie Glazier, MD;  Location: [facility];  Service: Thoracic;  Laterality: N/A;     FAMILY HISTORY   Family History  Problem Relation Age of Onset   Diabetes Mother    Heart disease Mother    Heart attack Father    Prostate cancer Father      SOCIAL HISTORY   Social History   Tobacco Use   Smoking status: Every Day    Packs/day: 1.00    Years: 30.00    Total pack years: 30.00    Types: Cigarettes   Smokeless tobacco: Never   Tobacco comments:    since age 1 or 58  Vaping Use   Vaping Use: Never used  Substance Use Topics   Alcohol use: Not Currently    Alcohol/week: 2.0 standard drinks of alcohol    Types: 2 Standard drinks or equivalent per week    Comment: 1-2 drinks per week   Drug use: Yes    Types: Marijuana    Comment: 2-3 week     MEDICATIONS    Home Medication:    Current Medication:  Current Facility-Administered Medications:    0.9 %  sodium chloride infusion, , Intravenous, Continuous, Ilene Qua, MD, Last Rate: 50 mL/hr at 10/29/22 1205, New Bag at 10/29/22 1205  Facility-Administered Medications Ordered in Other Encounters:    heparin lock flush 100 UNIT/ML injection, , , ,    heparin lock flush 100 UNIT/ML injection, , , ,    heparin lock flush 100 UNIT/ML injection, , , ,     ALLERGIES   Paclitaxel, Ambien [zolpidem], Nsaids, and Aleve [naproxen sodium]     REVIEW OF SYSTEMS    Review of  Systems:  Gen:  Denies  fever, sweats, chills weigh loss  HEENT: Denies blurred vision, double vision, ear pain, eye pain, hearing loss, nose bleeds, sore throat Cardiac:  No dizziness, chest pain or heaviness, chest tightness,edema Resp:   reports dyspnea chronically  Gi: Denies swallowing difficulty, stomach pain, nausea or vomiting, diarrhea, constipation, bowel incontinence Gu:  Denies bladder incontinence, burning urine Ext:   Denies Joint pain, stiffness or swelling Skin: Denies  skin rash, easy bruising or bleeding or hives Endoc:  Denies polyuria, polydipsia , polyphagia or weight change Psych:   Denies depression, insomnia or hallucinations   Other:  All  other systems negative   VS: BP 129/77   Pulse 83   Temp 97.7 F (36.5 C) (Tympanic)   Resp 14   Ht 5' 11"  (1.803 m)   Wt 75.3 kg   SpO2 100%   BMI 23.15 kg/m      PHYSICAL EXAM    GENERAL:NAD, no fevers, chills, no weakness no fatigue HEAD: Normocephalic, atraumatic.  EYES: Pupils equal, round, reactive to light. Extraocular muscles intact. No scleral icterus.  MOUTH: Moist mucosal membrane. Dentition intact. No abscess noted.  EAR, NOSE, THROAT: Clear without exudates. No external lesions.  NECK: Supple. No thyromegaly. No nodules. No JVD.  PULMONARY: decreased breath sounds with mild rhonchi worse at bases bilaterally.  CARDIOVASCULAR: S1 and S2. Regular rate and rhythm. No murmurs, rubs, or gallops. No edema. Pedal pulses 2+ bilaterally.  GASTROINTESTINAL: Soft, nontender, nondistended. No masses. Positive bowel sounds. No hepatosplenomegaly.  MUSCULOSKELETAL: No swelling, clubbing, or edema. Range of motion full in all extremities.  NEUROLOGIC: Cranial nerves II through XII are intact. No gross focal neurological deficits. Sensation intact. Reflexes intact.  SKIN: No ulceration, lesions, rashes, or cyanosis. Skin warm and dry. Turgor intact.  PSYCHIATRIC: Mood, affect within normal limits. The patient is  awake, alert and oriented x 3. Insight, judgment intact.       IMAGING     ASSESSMENT/PLAN   Right upper lobe cavitary lung mass  -previous core biopsy with necrosis and was +p40 s/p treatment.  Not much clinical improvement.  Here today for EBUS  assisted bx of L4, airway inspection and biopsies/BAL. -he also has been struggling with copious amounts of inspissated mucus plugging which we plan to eval during airway inspection and hopefully remove via aspiration -plan for L4 lymph node station biopsy   -Reviewed risks/complications and benefits with patient, risks include infection, pneumothorax/pneumomediastinum which may require chest tube placement as well as overnight/prolonged hospitalization and possible mechanical ventilation. Other risks include bleeding and very rarely death.  Patient understands risks and wishes to proceed.  Additional questions were answered, and patient is aware that post procedure patient will be going home with family and may experience cough with possible clots on expectoration as well as phlegm which may last few days as well as hoarseness of voice post intubation and mechanical ventilation.          Thank you for allowing me to participate in the care of this patient.   Patient/Family are satisfied with care plan and all questions have been answered.    Provider disclosure: Patient with at least one acute or chronic illness or injury that poses a threat to life or bodily function and is being managed actively during this encounter.  All of the below services have been performed independently by signing provider:  review of prior documentation from internal and or external health records.  Review of previous and current lab results.  Interview and comprehensive assessment during patient visit today. Review of current and previous chest radiographs/CT scans. Discussion of management and test interpretation with health care team and patient/family.   This  document was prepared using Dragon voice recognition software and may include unintentional dictation errors.     Ottie Glazier, M.D.  Division of Pulmonary & Critical Care Medicine

## 2022-10-29 NOTE — Progress Notes (Signed)
With ambulation sp02 remains between 94-96% on room air.

## 2022-10-29 NOTE — Discharge Instructions (Signed)

## 2022-10-29 NOTE — Anesthesia Preprocedure Evaluation (Addendum)
Anesthesia Evaluation  Patient identified by MRN, date of birth, ID band Patient awake    Reviewed: Allergy & Precautions, NPO status , Patient's Chart, lab work & pertinent test results  Airway Mallampati: III  TM Distance: >3 FB Neck ROM: full    Dental  (+) Edentulous Lower, Edentulous Upper   Pulmonary shortness of breath, COPD,  COPD inhaler, Current Smoker Lung ca   Pulmonary exam normal        Cardiovascular hypertension, On Medications Normal cardiovascular exam     Neuro/Psych  PSYCHIATRIC DISORDERS  Depression    negative neurological ROS     GI/Hepatic negative GI ROS, Neg liver ROS,,,  Endo/Other  diabetes, Oral Hypoglycemic Agents    Renal/GU      Musculoskeletal   Abdominal   Peds  Hematology negative hematology ROS (+)   Anesthesia Other Findings Past Medical History: No date: Acute on chronic respiratory failure (HCC) No date: Anemia No date: Cancer ([place]) No date: Colostomy present (HCC) No date: COPD (chronic obstructive pulmonary disease) (HCC) No date: Degenerative arthritis of knee No date: Depression No date: Diabetes mellitus without complication (HCC) No date: Diverticulitis No date: Dyspnea No date: Glaucoma     Comment:  since 2004 1979: Hernia No date: History of kidney stones No date: History of methicillin resistant staphylococcus aureus (MRSA) No date: Hypertension No date: Hypokalemia No date: Marijuana use No date: Mass of cecum No date: Nephrolithiasis No date: Neuromuscular disorder (HCC) No date: Personal history of tobacco use, presenting hazards to health No date: Pneumonia No date: Port-A-Cath in place No date: Pre-diabetes No date: Primary cancer of right upper lobe of lung (HCC) No date: 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) No date: Screening for obesity No date: Severe sepsis ([place]) No date: Special screening for malignant neoplasms, colon No date:  Tobacco use No date: Wears dentures     Comment:  full upper and lower  Past Surgical History: 1994, 2012: BACK SURGERY     Comment:  lumbar bulging disc 05/05/2022: BRONCHOSCOPY 01/20/2021: CATARACT EXTRACTION W/PHACO; Left     Comment:  Procedure: CATARACT EXTRACTION PHACO AND INTRAOCULAR               LENS PLACEMENT ([place]) LEFT;  Surgeon: Birder Robson,               MD;  Location: [place];  Service:               Ophthalmology;  Laterality: Left;  10.13 0:52.1 9767,[id number]: COLONOSCOPY     Comment:  [facility]/Dr. Buelah Manis sessile polyp,60mm, found in               rectum,multiple diverticula were found in the sigmoid, in              descending and in transverse colon 10/22/2021: COLONOSCOPY WITH PROPOFOL; N/A     Comment:  Procedure: COLONOSCOPY WITH PROPOFOL;  Surgeon: Annamaria Helling, DO;  Location: [place];  Service:               Gastroenterology;  Laterality: N/A;  DM 04/20/2013: COLOSTOMY No date: EYE SURGERY 1979: HERNIA REPAIR     Comment:  inguinal 06/10/2021: IR IMAGING GUIDED PORT INSERTION No date: JOINT REPLACEMENT; Right     Comment:  knee 05/08/2018: KNEE ARTHROPLASTY; Right     Comment:  Procedure: COMPUTER ASSISTED TOTAL KNEE ARTHROPLASTY;  Surgeon: Dereck Leep, MD;  Location: [facility];                Service: Orthopedics;  Laterality: Right; 11/16/2019: KNEE ARTHROPLASTY; Left     Comment:  Procedure: COMPUTER ASSISTED TOTAL KNEE ARTHROPLASTY;                Surgeon: Dereck Leep, MD;  Location: [facility];                Service: Orthopedics;  Laterality: Left; 04/20/2013: LAPAROTOMY     Comment:  colon resection with colosotmy 2013: LITHOTRIPSY 2023: LUNG BIOPSY 1962: TONSILLECTOMY AND ADENOIDECTOMY 05/05/2022: VIDEO BRONCHOSCOPY WITH ENDOBRONCHIAL NAVIGATION; N/A     Comment:  Procedure: VIDEO BRONCHOSCOPY WITH ENDOBRONCHIAL               NAVIGATION;  Surgeon: Ottie Glazier, MD;  Location:                [facility];  Service: Thoracic;  Laterality: N/A; 05/05/2022: VIDEO BRONCHOSCOPY WITH ENDOBRONCHIAL ULTRASOUND; N/A     Comment:  Procedure: VIDEO BRONCHOSCOPY WITH ENDOBRONCHIAL               ULTRASOUND;  Surgeon: Ottie Glazier, MD;  Location:               [facility];  Service: Thoracic;  Laterality: N/A;  BMI    Body Mass Index: 23.15 kg/m      Reproductive/Obstetrics negative OB ROS                             Anesthesia Physical Anesthesia Plan  ASA: 3  Anesthesia Plan: General ETT   Post-op Pain Management: Ofirmev IV (intra-op)* and Toradol IV (intra-op)*   Induction: Intravenous  PONV Risk Score and Plan: Ondansetron, Dexamethasone, Midazolam and Treatment may vary due to age or medical condition  Airway Management Planned: Oral ETT  Additional Equipment:   Intra-op Plan:   Post-operative Plan: Extubation in OR  Informed Consent: I have reviewed the patients History and Physical, chart, labs and discussed the procedure including the risks, benefits and alternatives for the proposed anesthesia with the patient or authorized representative who has indicated his/her understanding and acceptance.     Dental Advisory Given  Plan Discussed with: Anesthesiologist, CRNA and Surgeon  Anesthesia Plan Comments: (Patient consented for risks of anesthesia including but not limited to:  - adverse reactions to medications - damage to eyes, teeth, lips or other oral mucosa - nerve damage due to positioning  - sore throat or hoarseness - Damage to heart, brain, nerves, lungs, other parts of body or loss of life  Patient voiced understanding.)        Anesthesia Quick Evaluation

## 2022-11-01 ENCOUNTER — Encounter: Payer: Self-pay | Admitting: Pulmonary Disease

## 2022-11-01 LAB — CYTOLOGY - NON PAP

## 2022-11-01 LAB — CULTURE, BAL-QUANTITATIVE W GRAM STAIN: Culture: NO GROWTH

## 2022-11-01 MED FILL — Dexamethasone Sodium Phosphate Inj 100 MG/10ML: INTRAMUSCULAR | Qty: 1 | Status: AC

## 2022-11-02 ENCOUNTER — Encounter: Payer: Self-pay | Admitting: Internal Medicine

## 2022-11-02 ENCOUNTER — Inpatient Hospital Stay: Payer: Medicare PPO

## 2022-11-02 ENCOUNTER — Inpatient Hospital Stay (HOSPITAL_BASED_OUTPATIENT_CLINIC_OR_DEPARTMENT_OTHER): Payer: Medicare PPO | Admitting: Internal Medicine

## 2022-11-02 DIAGNOSIS — C3411 Malignant neoplasm of upper lobe, right bronchus or lung: Secondary | ICD-10-CM

## 2022-11-02 LAB — CBC WITH DIFFERENTIAL/PLATELET
Abs Immature Granulocytes: 0.03 10*3/uL (ref 0.00–0.07)
Basophils Absolute: 0.1 10*3/uL (ref 0.0–0.1)
Basophils Relative: 1 %
Eosinophils Absolute: 0.8 10*3/uL — ABNORMAL HIGH (ref 0.0–0.5)
Eosinophils Relative: 11 %
HCT: 32.8 % — ABNORMAL LOW (ref 39.0–52.0)
Hemoglobin: 10.7 g/dL — ABNORMAL LOW (ref 13.0–17.0)
Immature Granulocytes: 0 %
Lymphocytes Relative: 16 %
Lymphs Abs: 1.2 10*3/uL (ref 0.7–4.0)
MCH: 28.6 pg (ref 26.0–34.0)
MCHC: 32.6 g/dL (ref 30.0–36.0)
MCV: 87.7 fL (ref 80.0–100.0)
Monocytes Absolute: 0.8 10*3/uL (ref 0.1–1.0)
Monocytes Relative: 10 %
Neutro Abs: 4.4 10*3/uL (ref 1.7–7.7)
Neutrophils Relative %: 62 %
Platelets: 224 10*3/uL (ref 150–400)
RBC: 3.74 MIL/uL — ABNORMAL LOW (ref 4.22–5.81)
RDW: 14.5 % (ref 11.5–15.5)
WBC: 7.2 10*3/uL (ref 4.0–10.5)
nRBC: 0 % (ref 0.0–0.2)

## 2022-11-02 LAB — COMPREHENSIVE METABOLIC PANEL
ALT: 8 U/L (ref 0–44)
AST: 13 U/L — ABNORMAL LOW (ref 15–41)
Albumin: 3.4 g/dL — ABNORMAL LOW (ref 3.5–5.0)
Alkaline Phosphatase: 98 U/L (ref 38–126)
Anion gap: 9 (ref 5–15)
BUN: 9 mg/dL (ref 8–23)
CO2: 27 mmol/L (ref 22–32)
Calcium: 8.6 mg/dL — ABNORMAL LOW (ref 8.9–10.3)
Chloride: 102 mmol/L (ref 98–111)
Creatinine, Ser: 0.96 mg/dL (ref 0.61–1.24)
GFR, Estimated: >60 mL/min (ref >60)
Glucose, Bld: 104 mg/dL — ABNORMAL HIGH (ref 70–99)
Potassium: 3.4 mmol/L — ABNORMAL LOW (ref 3.5–5.1)
Sodium: 138 mmol/L (ref 135–145)
Total Bilirubin: 0.3 mg/dL (ref 0.3–1.2)
Total Protein: 7.1 g/dL (ref 6.5–8.1)

## 2022-11-02 MED ORDER — HEPARIN SOD (PORK) LOCK FLUSH 100 UNIT/ML IV SOLN
500.0000 [IU] | Freq: Once | INTRAVENOUS | Status: AC | PRN
  Administered 2022-11-02: 500 [IU]
  Filled 2022-11-02: qty 5

## 2022-11-02 MED ORDER — SODIUM CHLORIDE 0.9% FLUSH
10.0000 mL | Freq: Once | INTRAVENOUS | Status: AC | PRN
  Administered 2022-11-02: 10 mL
  Filled 2022-11-02: qty 10

## 2022-11-02 MED ORDER — SODIUM CHLORIDE 0.9 % IV SOLN
Freq: Once | INTRAVENOUS | Status: AC
  Filled 2022-11-02: qty 250

## 2022-11-02 MED ORDER — OXYCODONE HCL 5 MG PO TABS: 5.0000 mg | ORAL_TABLET | Freq: Three times a day (TID) | ORAL | 0 refills | Status: DC | PRN

## 2022-11-02 NOTE — Assessment & Plan Note (Addendum)
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   May 05, 2022-s/p bronchoscopy- with Dr.Aleskerov-significant purulent/necrotic debris noted. Dec 23rd, 2023- PET scan: The cavitary process in the right upper lobe is similar in size to previous CTS, although demonstrates prominent peripheral hypermetabolic activity. 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 Hypermetabolic activity within the adjacent upper right lateral chest wall with right 3rd through 5th rib deformities, suspicious for direct tumor spread. No evidence of distant osseous metastatic disease; Hypermetabolic left hilar lymph node worrisome for metastatic disease. No other hypermetabolic lymph nodes or distant metastases identified.  Bronchoscopy/EBUS [facility] 26th, 2024- Dr.A]- BAL/ 4R-lymph node FNA negative for malignancy.  # Discussed the option of further invasive procedures like chest wall biopsy/referral to thoracic surgery-however, given patient's multiple comorbidities and risk of potential complications I think is reasonable to hold off any further procedures.  Patient/wife seem to be in agreement.  Discussed with Dr.Aleskerov.  Will repeat imaging in March; ordered next visit.  # Last [name] was in May-June 2023. Discussed with pt/wife- pros and cons of re-starting keytruda-based on imaging.  They have not decided at this time.  They will call and let us know if they are interested in proceeding with treatment.   #Right chest wall pain-/pleuritic-rib destruction malignancy versus radiation- OFF fenatny patch 25 mcg.  Oxycodone 5 mg every 8 to 12 hours hours as needed overall-  stable. ; refilled  # COPD/fatigue- [coughing/phlegm/ no fevers]- on albuterol/advair [smoking]- TSH-  WNL;Continue  Xopenex/ipratropium nebs-; Mucinex- DM BID.  Stable.  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 Bary Castilla.  CT OCt 5th, 2023-Filling defect in the cecum near the appendiceal orifice measuring about 3.4 by 2.9 cm, concerning for mass,. DEC 1660- Hypermetabolic cecal mass suspicious for a villous adenoma or colon cancer causing obstruction of the appendiceal orifice. There is also abnormal dilation of the appendix up to 1.1 cm in diameter, similar to previous.  Patient not too keen on any surgical interventions at this time.  Overall stable.  Await repeat imaging in March.  # Poorly controlled diabetes-blood sugars 113- 280;  Continue glipizide; and  Metformin. Stable.  # IV mediport: functioning/Stable.  # Hypokalemia-continue potassium 20 mEq liquid.- Stable.  # Insomnia-continue Lunesta refill.  *appt thru mychart   # DISPOSITION:  # today- IVFs over 1 hour.  # IVFs on 2/01  # in 1 week- IVFs-1 lit /1 hourTuesdays/Thursday   # in 2 weeks-  IVFs-1 lit /1 hourTuesdays/Thursday   # in 3 weeks-  IVFs-1 lit /1 hourTuesdays/Thursday   # follow up in 4 weeks-Tuesdays; MD; labs- cbc/cmp; TSH; iron studies;ferritin-  IVFs-1 lit /1 hour; - Dr.B

## 2022-11-02 NOTE — Progress Notes (Signed)
[place] NOTE  Patient Care Team: [name], MD as PCP - General (Family Medicine) [name], RN as Oncology Nurse Navigator [name], [name], MD as Consulting Physician (Urology) [name], MD as Consulting Physician (Oncology) Ottie Glazier, MD as Consulting Physician (Pulmonary Disease)  CHIEF COMPLAINTS/PURPOSE OF CONSULTATION: lung cancer    Oncology History Overview Note  AUG 2022- 8.2 x 5.9 cm spiculated mass noted in right upper lobe consistent with malignancy. It extends from the right hilum to the lateral chest wall and results in lytic destruction of the lateral portion of the right fourth rib.  Mediastinal lymph nodes are noted, including 12 mm right hilar lymph node, consistent with metastatic disease. 3 cm right adrenal mass is noted concerning for metastatic disease.  # AUG 2022- PET IMPRESSION: Peripherally hypermetabolic right upper lobe mass, likely due to central necrosis. Mass extends into the right hilum and right lateral chest wall involving the second through fourth right ribs.   Mildly enlarged and hypermetabolic right hilar lymph node.   No evidence of metastatic disease in the abdomen or pelvis.  # AUG, 2022-RIGHT CHEST WALL Bx-necrosis; suspicious for malignancy not definitive [discussed with Dr.Kraynie;MDT] LUNG MASS, RIGHT; BIOPSY:  - SUSPICIOUS FOR MALIGNANCY.  - SEE COMMENT.   Comment:  Approximately six tissue cores demonstrate inflamed fibrous capsule with  hemosiderin-laden macrophages, in a background of abundant necrosis.  One of the cores demonstrates a minute fragment of viable epithelial  cells.  These cells are positive for p40.  They are negative for TTF1  and CK7. The cells of interest disappear on deeper cut levels.  While  the findings are suspicious for squamous cell carcinoma, there is not  enough viable tumor for a definitive diagnosis.  Additional tissue  sampling may be helpful.    # SEP 2022-cycle #2 Taxol hypersensitive reaction.  Switched over to #3 cycle- carbo-Abraxane starting 06/29/2021  # DEC 2022- s/p ID- Dr.ravishankar- ? Lung abscess-no antibiotics-  JAN 2023- CT scan incidental- cecal mass- colonoscopy[Dr.Russo; [facility]- high grade dysplasia]  S/p evaluation with Dr. [name] surgery for now]   # S/p evaluation with Dr. [name].   Primary cancer of right upper lobe of lung ([place])  06/03/2021 Initial Diagnosis   Primary cancer of right upper lobe of lung ([place])   06/03/2021 Cancer Staging   Staging form: Lung, AJCC 8th Edition - Clinical: Stage IIIB (cT3, cN2, cM0) - Signed by [name], MD on 06/03/2021   06/15/2021 - 08/03/2021 Chemotherapy   Patient is on Treatment Plan : LUNG Carboplatin / Paclitaxel + XRT q7d     10/02/2021 - 10/19/2021 Chemotherapy   Patient is on Treatment Plan : LUNG Durvalumab q14d     02/04/2022 -  Chemotherapy   Patient is on Treatment Plan : LUNG NSCLC Pembrolizumab (200) q21d       HISTORY OF PRESENTING ILLNESS: Patient is ambulating independently.  Accompanied by wife.  Rodney Vega 67 y.o.  male history of smoking -"lung cancer" [Limited necrotic tissue]-locally advanced unresectable; and sigmoid colon cancer intact- currently on surveillance is here for follow-up.   In interim patient underwent BAL/EBUS-given the concern for recurrence based on PET scan from January.   Having trouble sleeping with no relief taking nightly Lunesta.  Chronic right chest wall pain for which patient is taking Oxycodone 5 mg twice with occasional mid day dose if needed with good pain control. 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  Chronic mild shortness of breath mild cough.  However no fever no chills.  He continues to get IV fluids-twice a week-feels improved after IV infusions.  Denies abdominal pain nausea vomiting.  No blood in stools black or stools.  Review of Systems  Constitutional:  Positive for  malaise/fatigue and weight loss. Negative for chills, diaphoresis and fever.  HENT:  Negative for nosebleeds and sore throat.   Eyes:  Negative for double vision.  Respiratory:  Positive for cough, sputum production and shortness of breath. Negative for hemoptysis and wheezing.   Cardiovascular:  Positive for chest pain. Negative for palpitations, orthopnea and leg swelling.  Gastrointestinal:  Positive for nausea. Negative for abdominal pain, blood in stool, constipation, diarrhea, heartburn, melena and vomiting.  Genitourinary:  Negative for dysuria, frequency and urgency.  Musculoskeletal:  Negative for back pain and joint pain.  Skin: Negative.  Negative for itching and rash.  Neurological:  Positive for tingling. Negative for dizziness, focal weakness, weakness and headaches.  Endo/Heme/Allergies:  Does not bruise/bleed easily.  Psychiatric/Behavioral:  Negative for depression. The patient is not nervous/anxious and does not have insomnia.      MEDICAL HISTORY:  Past Medical History:  Diagnosis Date   Acute on chronic respiratory failure (HCC)    Anemia    Cancer ([place])    Colostomy present ([place])    COPD (chronic obstructive pulmonary disease) ([place])    Degenerative arthritis of knee    Depression    Diabetes mellitus without complication ([place])    Diverticulitis    Dyspnea    Glaucoma    since 2004   Hernia 1979   History of kidney stones    History of methicillin resistant staphylococcus aureus (MRSA)    Hypertension    Hypokalemia    Marijuana use    Mass of cecum    Nephrolithiasis    Neuromuscular disorder ([place])    Personal history of tobacco use, presenting hazards to health    Pneumonia    Port-A-Cath in place    Pre-diabetes    Primary cancer of right upper lobe of lung ([place])    SCC of lung (small cell carcinoma) ([place])    Screening for obesity    Severe sepsis ([place])    Special screening for malignant neoplasms, colon    Tobacco use    Wears dentures    full  upper and lower    SURGICAL HISTORY: Past Surgical History:  Procedure Laterality Date   [place], 2012   lumbar bulging disc   BRONCHOSCOPY  05/05/2022   CATARACT EXTRACTION W/PHACO Left 01/20/2021   Procedure: CATARACT EXTRACTION PHACO AND INTRAOCULAR LENS PLACEMENT ([place]) LEFT;  Surgeon: Birder Robson, MD;  Location: [facility];  Service: Ophthalmology;  Laterality: Left;  10.13 0:52.1   COLONOSCOPY  1856,[id number]   [facility]/Dr. Buelah Manis sessile polyp,10m, found in rectum,multiple diverticula were found in the sigmoid, in descending and in transverse colon   COLONOSCOPY WITH PROPOFOL N/A 10/22/2021   Procedure: COLONOSCOPY WITH PROPOFOL;  Surgeon: RAnnamaria Helling DO;  [facility];  Service: Gastroenterology;  Laterality: N/A;  DM   COLOSTOMY  04/20/2013   EYE SURGERY     HERNIA REPAIR  1979   inguinal   IR IMAGING GUIDED PORT INSERTION  06/10/2021   JOINT REPLACEMENT Right    knee   KNEE ARTHROPLASTY Right 05/08/2018   Procedure: COMPUTER ASSISTED TOTAL KNEE ARTHROPLASTY;  Surgeon: HDereck Leep MD;  Location: [facility];  Service: Orthopedics;  Laterality: Right;   KNEE ARTHROPLASTY Left 11/16/2019   Procedure: COMPUTER ASSISTED TOTAL KNEE ARTHROPLASTY;  Surgeon: Dereck Leep, MD;  Location: [facility];  Service: Orthopedics;  Laterality: Left;   LAPAROTOMY  04/20/2013   colon resection with colosotmy   LITHOTRIPSY  2013   LUNG BIOPSY  2023   RIGID BRONCHOSCOPY N/A 10/29/2022   Procedure: RIGID BRONCHOSCOPY;  Surgeon: Ottie Glazier, MD;  Location: [facility];  Service: Thoracic;  Laterality: N/A;   [place] WITH ENDOBRONCHIAL NAVIGATION N/A 05/05/2022   Procedure: VIDEO BRONCHOSCOPY WITH ENDOBRONCHIAL NAVIGATION;  Surgeon: Ottie Glazier, MD;  Location: [facility];  Service: Thoracic;  Laterality: N/A;   VIDEO BRONCHOSCOPY WITH ENDOBRONCHIAL ULTRASOUND N/A 05/05/2022   Procedure: VIDEO BRONCHOSCOPY  WITH ENDOBRONCHIAL ULTRASOUND;  Surgeon: Ottie Glazier, MD;  Location: [facility];  Service: Thoracic;  Laterality: N/A;   VIDEO BRONCHOSCOPY WITH ENDOBRONCHIAL ULTRASOUND N/A 10/29/2022   Procedure: VIDEO BRONCHOSCOPY WITH ENDOBRONCHIAL ULTRASOUND;  Surgeon: Ottie Glazier, MD;  Location: [facility];  Service: Thoracic;  Laterality: N/A;    SOCIAL HISTORY: Social History   Socioeconomic History   Marital status: Married    Spouse name: Pegge   Number of children: Not on file   Years of education: Not on file   Highest education level: Not on file  Occupational History   Not on file  Tobacco Use   Smoking status: Every Day    Packs/day: 1.00    Years: 30.00    Total pack years: 30.00    Types: Cigarettes   Smokeless tobacco: Never   Tobacco comments:    since age 57 or 67  Vaping Use   Vaping Use: Never used  Substance and Sexual Activity   Alcohol use: Not Currently    Alcohol/week: 2.0 standard drinks of alcohol    Types: 2 Standard drinks or equivalent per week    Comment: 1-2 drinks per week   Drug use: Yes    Types: Marijuana    Comment: 2-3 week   Sexual activity: Not on file  Other Topics Concern   Not on file  Social History Narrative   15 mins [employer]; smoking; not much alcohol. Worked for [employer], 2019. Marland Kitchen    Social Determinants of Health   Financial Resource Strain: Low Risk  (08/10/2022)   Overall Financial Resource Strain (CARDIA)    Difficulty of Paying Living Expenses: Not very hard  Food Insecurity: No Food Insecurity (08/10/2022)   Hunger Vital Sign    Worried About Running Out of Food in the Last Year: Never true    Ran Out of Food in the Last Year: Never true  Transportation Needs: No Transportation Needs (10/12/2022)   PRAPARE - Hydrologist (Medical): No    Lack of Transportation (Non-Medical): No  Physical Activity: Not on file  Stress: No Stress Concern Present (08/10/2022)   [place]    Feeling of Stress : Not at all  Social Connections: Unknown (08/10/2022)   Social Connection and Isolation Panel [NHANES]    Frequency of Communication with Friends and Family: Three times a week    Frequency of Social Gatherings with Friends and Family: Twice a week    Attends Religious Services: Patient refused    Active Member of Clubs or Organizations: No    Attends Archivist Meetings: Never    Marital Status: Married  Intimate  Partner Violence: Not At Risk (08/10/2022)   Humiliation, Afraid, Rape, and Kick questionnaire    Fear of Current or Ex-Partner: No    Emotionally Abused: No    Physically Abused: No    Sexually Abused: No    FAMILY HISTORY: Family History  Problem Relation Age of Onset   Diabetes Mother    Heart disease Mother    Heart attack Father    Prostate cancer Father     ALLERGIES:  is allergic to paclitaxel, ambien [zolpidem], nsaids, and aleve [naproxen sodium].  MEDICATIONS:  Current Outpatient Medications  Medication Sig Dispense Refill   Accu-Chek Softclix Lancets lancets USE AS DIRECTED FOUR TIMES DAILY 100 each 12   albuterol (PROVENTIL) (2.5 MG/3ML) 0.083% nebulizer solution Take 2.5 mg by nebulization every 4 (four) hours as needed for wheezing or shortness of breath.     albuterol (VENTOLIN HFA) 108 (90 Base) MCG/ACT inhaler SMARTSIG:2 inhalation Via Inhaler Every 6 Hours PRN (Patient taking differently: 1-2 puffs every 6 (six) hours as needed. SMARTSIG:2 inhalation Via Inhaler Every 6 Hours PRN) 1 each 2   blood glucose meter kit and supplies KIT Check your blood glucose levels twice a day; once in the morning before breakfast; and once in the evening after dinner. 1 each 0   BREZTRI AEROSPHERE 160-9-4.8 MCG/ACT AERO Inhale 2 puffs into the lungs 2 (two) times daily.     clobetasol cream (TEMOVATE) 2.02 % Apply 1 Application topically 2 (two) times daily. 30 g 2    dorzolamide-timolol (COSOPT) 22.3-6.8 MG/ML ophthalmic solution Place 1 drop into both eyes 2 (two) times daily.     eszopiclone (LUNESTA) 1 MG TABS tablet TAKE 1 TABLET(1 MG) BY MOUTH AT BEDTIME AS NEEDED FOR SLEEP (Patient taking differently: Take 1 mg by mouth at bedtime as needed. TAKE 1 TABLET(1 MG) BY MOUTH AT BEDTIME AS NEEDED FOR SLEEP) 30 tablet 1   glipiZIDE (GLUCOTROL) 5 MG tablet TAKE 1 TABLET(5 MG) BY MOUTH DAILY BEFORE BREAKFAST 90 tablet 2   glucose blood test strip Check your blood glucose levels twice a day; once in the morning before breakfast; and once in the evening after dinner. 100 each 12   Hydrocod Polst-Chlorphen Polst (CHLORPHENIRAMINE-HYDROCODONE) 10-8 MG/5ML Take 5 mLs by mouth at bedtime as needed for cough. 140 mL 0   ipratropium-albuterol (DUONEB) 0.5-2.5 (3) MG/3ML SOLN Take 3 mLs by nebulization every 6 (six) hours as needed. 360 mL 1   latanoprost (XALATAN) 0.005 % ophthalmic solution Place 1 drop into both eyes at bedtime.     lidocaine-prilocaine (EMLA) cream Apply 1 Application topically as needed. 30 g 3   metFORMIN (GLUCOPHAGE) 500 MG tablet Take 1 tablet (500 mg total) by mouth 2 (two) times daily with a meal. 60 tablet 2   montelukast (SINGULAIR) 10 MG tablet TAKE 1 TABLET(10 MG) BY MOUTH AT BEDTIME (Patient taking differently: Take 10 mg by mouth at bedtime. TAKE 1 TABLET(10 MG) BY MOUTH AT BEDTIME) 90 tablet 0   Multiple Vitamin (MULTIVITAMIN WITH MINERALS) TABS tablet Take 1 tablet by mouth daily.     olmesartan (BENICAR) 40 MG tablet Take 1 tablet (40 mg total) by mouth daily. (Patient taking differently: Take 40 mg by mouth every morning.) 90 tablet 1   potassium chloride 20 MEQ/15ML (10%) SOLN Take 15 mLs (20 mEq total) by mouth 2 (two) times daily. (Patient taking differently: Take 20 mEq by mouth daily.) 473 mL 2   pregabalin (LYRICA) 50 MG capsule Take 1 capsule (50  mg total) by mouth 2 (two) times daily. 60 capsule 2   prochlorperazine (COMPAZINE) 10  MG tablet Take 10 mg by mouth every 6 (six) hours as needed for nausea or vomiting.     propranolol ER (INDERAL LA) 60 MG 24 hr capsule TAKE 1 CAPSULE(60 MG) BY MOUTH DAILY (Patient taking differently: 60 mg every morning.) 30 capsule 3   Respiratory Therapy Supplies (NEBULIZER) DEVI Use as directed 1 each 0   tamsulosin (FLOMAX) 0.4 MG CAPS capsule Take 0.4 mg by mouth daily after supper.     triamcinolone ointment (KENALOG) 0.5 % Apply 1 Application topically 2 (two) times daily. 30 g 0   venlafaxine XR (EFFEXOR-XR) 75 MG 24 hr capsule Take 225 mg by mouth daily with breakfast.     vitamin B-12 (CYANOCOBALAMIN) 500 MCG tablet Take 1 tablet by mouth daily.     Vitamin D, Ergocalciferol, (DRISDOL) 1.25 MG (50000 UNIT) CAPS capsule TAKE 1 CAPSULE BY MOUTH 1 TIME A WEEK (Patient taking differently: 50,000 Units every 7 (seven) days.) 12 capsule 1   acetaminophen (TYLENOL) 500 MG tablet Take 2 tablets (1,000 mg total) by mouth daily as needed. Home med. (Patient not taking: Reported on 10/29/2022) 30 tablet 0   cetirizine (ZYRTEC) 10 MG tablet Take 10 mg by mouth at bedtime. (Patient not taking: Reported on 11/02/2022)     dextromethorphan-guaiFENesin (MUCINEX DM) 30-600 MG 12hr tablet Take 1 tablet by mouth 2 (two) times daily as needed for cough. (Patient not taking: Reported on 10/05/2022)     guaiFENesin-codeine 100-10 MG/5ML syrup Take 5 mLs by mouth daily as needed for cough. (Patient not taking: Reported on 10/05/2022) 120 mL 0   oxyCODONE (OXY IR/ROXICODONE) 5 MG immediate release tablet Take 1 tablet (5 mg total) by mouth every 8 (eight) hours as needed for severe pain. 90 tablet 0   No current facility-administered medications for this visit.   Facility-Administered Medications Ordered in Other Visits  Medication Dose Route Frequency Provider Last Rate Last Admin   heparin lock flush 100 UNIT/ML injection            heparin lock flush 100 UNIT/ML injection            heparin lock flush 100  UNIT/ML injection            heparin lock flush 100 unit/mL  500 Units Intracatheter Once PRN [name], MD          .  PHYSICAL EXAMINATION: ECOG PERFORMANCE STATUS: 1 - Symptomatic but completely ambulatory  Vitals:   11/02/22 1000  BP: 125/75  Pulse: 85  Resp: 16  Temp: (!) 96.2 F (35.7 C)  SpO2: 95%       Filed Weights   11/02/22 1000  Weight: 165 lb 14.4 oz (75.3 kg)        Physical Exam Vitals and nursing note reviewed.  HENT:     Head: Normocephalic and atraumatic.     Mouth/Throat:     Pharynx: Oropharynx is clear.  Eyes:     Extraocular Movements: Extraocular movements intact.     Pupils: Pupils are equal, round, and reactive to light.  Cardiovascular:     Rate and Rhythm: Normal rate and regular rhythm.  Pulmonary:     Comments: Decreased breath sounds bilaterally.  Abdominal:     Palpations: Abdomen is soft.  Musculoskeletal:        General: Normal range of motion.     Cervical back: Normal range of  motion.  Skin:    General: Skin is warm.  Neurological:     General: No focal deficit present.     Mental Status: He is alert and oriented to person, place, and time.  Psychiatric:        Behavior: Behavior normal.        Judgment: Judgment normal.      LABORATORY DATA:  I have reviewed the data as listed Lab Results  Component Value Date   WBC 7.2 11/02/2022   HGB 10.7 (L) 11/02/2022   HCT 32.8 (L) 11/02/2022   MCV 87.7 11/02/2022   PLT 224 11/02/2022   Recent Labs    09/06/22 0917 09/17/22 1413 10/05/22 0938 11/02/22 0925  NA 140 139 137 138  K 3.5 4.3 3.5 3.4*  CL 104 104 102 102  CO2 26 27 25 27   GLUCOSE 180* 100* 134* 104*  BUN 12 15 13 9   CREATININE 0.92 1.01 1.10 0.96  CALCIUM 8.8* 9.2 8.7* 8.6*  GFRNONAA >60 >60 >60 >60  PROT 6.9  --  7.3 7.1  ALBUMIN 3.6  --  3.6 3.4*  AST 24  --  16 13*  ALT 14  --  8 8  ALKPHOS 106  --  94 98  BILITOT 0.4  --  0.4 0.3    RADIOGRAPHIC STUDIES: I have  personally reviewed the radiological images as listed and agreed with the findings in the report. DG Chest Portable 1 View  Result Date: 10/29/2022 CLINICAL DATA:  Status post bronchoscopy. EXAM: PORTABLE CHEST 1 VIEW COMPARISON:  September 16, 2022. FINDINGS: Grossly stable cavitary abnormality seen in the right upper lobe. Left internal jugular Port-A-Cath is unchanged. Left lung is clear. No pneumothorax or pleural effusion is noted. Stable old right rib fractures. IMPRESSION: No definite pneumothorax is noted status post bronchoscopy. Grossly stable cavitary lesion seen in right upper lobe. Electronically Signed   By: Marijo Conception M.D.   On: 10/29/2022 15:12    ASSESSMENT & PLAN:   Primary cancer of right upper lobe of lung ([place]) # UNRESECTABLE/LOCALLY ADVANCED- Right upper lobe lung mass -concerning for malignancy [biopsy inconclusive/atypical cells]-  April 2023- PD-L1 positive [cirucologene].   May 05, 2022-s/p bronchoscopy- with Dr.Aleskerov-significant purulent/necrotic debris noted. Dec 23rd, 2023- PET scan: The cavitary process in the right upper lobe is similar in size to previous CTS, although demonstrates prominent peripheral hypermetabolic activity. 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 Hypermetabolic activity within the adjacent upper right lateral chest wall with right 3rd through 5th rib deformities, suspicious for direct tumor spread. No evidence of distant osseous metastatic disease; Hypermetabolic left hilar lymph node worrisome for metastatic disease. No other hypermetabolic lymph nodes or distant metastases identified.  Bronchoscopy/EBUS [facility] 26th, 2024- Dr.A]- BAL/ 4R-lymph node FNA negative for malignancy.  # Discussed the option of further invasive procedures like chest wall biopsy/referral to thoracic surgery-however, given patient's multiple  comorbidities and risk of potential complications I think is reasonable to hold off any further procedures.  Patient/wife seem to be in agreement.  Discussed with Dr.Aleskerov.  Will repeat imaging in March; ordered next visit.  # Last [name] was in May-June 2023. Discussed with pt/wife- pros and cons of re-starting keytruda-based on imaging.  They have not decided at this time.  They will call and let us know if they are interested in proceeding with treatment.   #Right chest wall pain-/pleuritic-rib destruction  malignancy versus radiation- OFF fenatny patch 25 mcg.  Oxycodone 5 mg every 8 to 12 hours hours as needed overall-  stable. ; refilled  # COPD/fatigue- [coughing/phlegm/ no fevers]- on albuterol/advair [smoking]- TSH- WNL;Continue  Xopenex/ipratropium nebs-; Mucinex- DM BID.  Stable.  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 Bary Castilla.  CT OCt 5th, 2023-Filling defect in the cecum near the appendiceal orifice measuring about 3.4 by 2.9 cm, concerning for mass,. DEC 8341- Hypermetabolic cecal mass suspicious for a villous adenoma or colon cancer causing obstruction of the appendiceal orifice. There is also abnormal dilation of the appendix up to 1.1 cm in diameter, similar to previous.  Patient not too keen on any surgical interventions at this time.  Overall stable.  Await repeat imaging in March.  # Poorly controlled diabetes-blood sugars 113- 280;  Continue glipizide; and  Metformin. Stable.  # IV mediport: functioning/Stable.  # Hypokalemia-continue potassium 20 mEq liquid.- Stable.  # Insomnia-continue Lunesta refill.  *appt thru mychart   # DISPOSITION:  # today- IVFs over 1 hour.  # IVFs on 2/01  # in 1 week- IVFs-1 lit /1 hourTuesdays/Thursday   # in 2 weeks-  IVFs-1 lit /1 hourTuesdays/Thursday   # in 3 weeks-  IVFs-1 lit /1 hourTuesdays/Thursday   # follow up in 4  weeks-Tuesdays; MD; labs- cbc/cmp; TSH; iron studies;ferritin-  IVFs-1 lit /1 hour; - Dr.B     All questions were answered. The patient knows to call the clinic with any problems, questions or concerns.    [name], MD 11/02/2022 12:21 PM

## 2022-11-02 NOTE — Progress Notes (Signed)
Having trouble sleeping with no relief taking nightly Lunesta.    Episodes right axillary pain, 7/10 pain scale, raising lifting arm up.

## 2022-11-03 MED FILL — Dexamethasone Sodium Phosphate Inj 100 MG/10ML: INTRAMUSCULAR | Qty: 1 | Status: AC

## 2022-11-03 NOTE — Anesthesia Postprocedure Evaluation (Signed)
Anesthesia Post Note  Patient: Rodney Vega  Procedure(s) Performed: VIDEO BRONCHOSCOPY WITH ENDOBRONCHIAL ULTRASOUND RIGID BRONCHOSCOPY  Patient location during evaluation: PACU Anesthesia Type: General Level of consciousness: awake Pain management: pain level controlled Respiratory status: spontaneous breathing and nonlabored ventilation Cardiovascular status: stable Anesthetic complications: no  No notable events documented.   Last Vitals:  Vitals:   10/29/22 1600 10/29/22 1612  BP: 104/60 96/62  Pulse: 79 72  Resp: 10 15  Temp: 36.5 C (!) 36.4 C  SpO2: 97% 97%    Last Pain:  Vitals:   10/29/22 1612  TempSrc: Temporal  PainSc: 0-No pain                 VAN STAVEREN,Kalaya Infantino

## 2022-11-04 ENCOUNTER — Inpatient Hospital Stay: Payer: Medicare PPO | Attending: Internal Medicine

## 2022-11-04 VITALS — BP 133/75 | HR 85 | Temp 97.2°F | Resp 20 | Wt 164.0 lb

## 2022-11-04 DIAGNOSIS — I10 Essential (primary) hypertension: Secondary | ICD-10-CM | POA: Diagnosis not present

## 2022-11-04 DIAGNOSIS — Z5112 Encounter for antineoplastic immunotherapy: Secondary | ICD-10-CM | POA: Insufficient documentation

## 2022-11-04 DIAGNOSIS — Z79899 Other long term (current) drug therapy: Secondary | ICD-10-CM | POA: Insufficient documentation

## 2022-11-04 DIAGNOSIS — C3411 Malignant neoplasm of upper lobe, right bronchus or lung: Secondary | ICD-10-CM | POA: Diagnosis present

## 2022-11-04 DIAGNOSIS — R0789 Other chest pain: Secondary | ICD-10-CM | POA: Diagnosis not present

## 2022-11-04 DIAGNOSIS — R0602 Shortness of breath: Secondary | ICD-10-CM | POA: Diagnosis not present

## 2022-11-04 DIAGNOSIS — E1140 Type 2 diabetes mellitus with diabetic neuropathy, unspecified: Secondary | ICD-10-CM | POA: Diagnosis not present

## 2022-11-04 DIAGNOSIS — Z9221 Personal history of antineoplastic chemotherapy: Secondary | ICD-10-CM | POA: Insufficient documentation

## 2022-11-04 DIAGNOSIS — Z7984 Long term (current) use of oral hypoglycemic drugs: Secondary | ICD-10-CM | POA: Insufficient documentation

## 2022-11-04 DIAGNOSIS — E876 Hypokalemia: Secondary | ICD-10-CM | POA: Diagnosis not present

## 2022-11-04 DIAGNOSIS — C7951 Secondary malignant neoplasm of bone: Secondary | ICD-10-CM | POA: Insufficient documentation

## 2022-11-04 DIAGNOSIS — J449 Chronic obstructive pulmonary disease, unspecified: Secondary | ICD-10-CM | POA: Diagnosis not present

## 2022-11-04 DIAGNOSIS — C7971 Secondary malignant neoplasm of right adrenal gland: Secondary | ICD-10-CM | POA: Insufficient documentation

## 2022-11-04 DIAGNOSIS — F17210 Nicotine dependence, cigarettes, uncomplicated: Secondary | ICD-10-CM | POA: Insufficient documentation

## 2022-11-04 DIAGNOSIS — Z8042 Family history of malignant neoplasm of prostate: Secondary | ICD-10-CM | POA: Diagnosis not present

## 2022-11-04 DIAGNOSIS — E1165 Type 2 diabetes mellitus with hyperglycemia: Secondary | ICD-10-CM | POA: Insufficient documentation

## 2022-11-04 MED ORDER — HEPARIN SOD (PORK) LOCK FLUSH 100 UNIT/ML IV SOLN
500.0000 [IU] | Freq: Once | INTRAVENOUS | Status: AC | PRN
  Administered 2022-11-04: 500 [IU]
  Filled 2022-11-04: qty 5

## 2022-11-04 MED ORDER — SODIUM CHLORIDE 0.9 % IV SOLN
Freq: Once | INTRAVENOUS | Status: AC
  Filled 2022-11-04: qty 250

## 2022-11-04 MED ORDER — SODIUM CHLORIDE 0.9% FLUSH
10.0000 mL | Freq: Once | INTRAVENOUS | Status: AC | PRN
  Administered 2022-11-04: 10 mL
  Filled 2022-11-04: qty 10

## 2022-11-05 ENCOUNTER — Telehealth: Payer: Self-pay | Admitting: *Deleted

## 2022-11-05 NOTE — Telephone Encounter (Signed)
Patient asking to speak with Dr B or Merrily Pew about restarting his [name]

## 2022-11-09 ENCOUNTER — Other Ambulatory Visit: Payer: Self-pay | Admitting: Internal Medicine

## 2022-11-09 ENCOUNTER — Inpatient Hospital Stay: Payer: Medicare PPO

## 2022-11-09 VITALS — BP 118/70 | HR 82 | Temp 96.3°F | Resp 20

## 2022-11-09 DIAGNOSIS — Z95828 Presence of other vascular implants and grafts: Secondary | ICD-10-CM

## 2022-11-09 DIAGNOSIS — C3411 Malignant neoplasm of upper lobe, right bronchus or lung: Secondary | ICD-10-CM

## 2022-11-09 MED ORDER — SODIUM CHLORIDE 0.9% FLUSH
10.0000 mL | Freq: Once | INTRAVENOUS | Status: AC
  Administered 2022-11-09: 10 mL via INTRAVENOUS
  Filled 2022-11-09: qty 10

## 2022-11-09 MED ORDER — HEPARIN SOD (PORK) LOCK FLUSH 100 UNIT/ML IV SOLN
500.0000 [IU] | Freq: Once | INTRAVENOUS | Status: AC
  Administered 2022-11-09: 500 [IU]
  Filled 2022-11-09: qty 5

## 2022-11-09 MED ORDER — SODIUM CHLORIDE 0.9 % IV SOLN
Freq: Once | INTRAVENOUS | Status: AC
  Filled 2022-11-09: qty 250

## 2022-11-09 NOTE — Progress Notes (Signed)
OFF PATHWAY REGIMEN - Non-Small Cell Lung  No Change  Continue With Treatment as Ordered.  Original Decision Date/Time: 01/21/2022 10:18   [id number]:Pembrolizumab 200 mg IV D1 q21 Days:   A cycle is every 21 days:     Pembrolizumab   **Always confirm dose/schedule in your pharmacy ordering system**  Patient Characteristics: Stage IV Metastatic, Nonsquamous, Molecular Analysis Completed, Molecular Alteration Present and Targeted Therapy Exhausted OR EGFR Exon 20+ or KRAS G12C+ or HER2+ Present and No Prior Chemo/Immunotherapy OR No Alteration Present, Initial  Chemotherapy/Immunotherapy, PS = 0, 1, No Alteration Present, Did Not Order Molecular Analysis/Quantity Not Sufficient for Molecular Analysis Therapeutic Status: Stage IV Metastatic Histology: Nonsquamous Cell Broad Molecular Profiling Status: Molecular Analysis Completed Molecular Analysis Results: No Alteration Present ECOG Performance Status: 1 Chemotherapy/Immunotherapy Line of Therapy: Initial Chemotherapy/Immunotherapy EGFR Exons 18-21 Mutation Testing Status: Completed and Negative ALK Fusion/Rearrangement Testing Status: Completed and Negative BRAF V600 Mutation Testing Status: Quantity Not Sufficient KRAS G12C Mutation Testing Status: Quantity Not Sufficient MET Exon 14 Mutation Testing Status: Quantity Not Sufficient RET Fusion/Rearrangement Testing Status: Quantity Not Sufficient HER2 Mutation Testing Status: Quantity Not Sufficient NTRK Fusion/Rearrangement Testing Status: Completed and Negative ROS1 Fusion/Rearrangement Testing Status: Completed and Negative Intent of Therapy: Non-Curative / Palliative Intent, Discussed with Patient

## 2022-11-09 NOTE — Progress Notes (Signed)
Discussed at length regarding pros and cons of restarting of Keytruda.  Patient interested.  Will plan to start next week/thursday.  Discussed with staff- MD: labs- cbc/cmp;TSH; Keytruda.

## 2022-11-11 ENCOUNTER — Inpatient Hospital Stay: Payer: Medicare PPO

## 2022-11-11 ENCOUNTER — Ambulatory Visit: Payer: Medicare PPO | Admitting: Oncology

## 2022-11-11 ENCOUNTER — Other Ambulatory Visit: Payer: Medicare PPO

## 2022-11-11 VITALS — BP 153/86 | HR 83 | Temp 97.7°F | Resp 20 | Wt 170.0 lb

## 2022-11-11 DIAGNOSIS — C3411 Malignant neoplasm of upper lobe, right bronchus or lung: Secondary | ICD-10-CM

## 2022-11-11 MED ORDER — HEPARIN SOD (PORK) LOCK FLUSH 100 UNIT/ML IV SOLN
500.0000 [IU] | Freq: Once | INTRAVENOUS | Status: AC | PRN
  Administered 2022-11-11: 500 [IU]
  Filled 2022-11-11: qty 5

## 2022-11-11 MED ORDER — SODIUM CHLORIDE 0.9 % IV SOLN
Freq: Once | INTRAVENOUS | Status: AC
  Filled 2022-11-11: qty 250

## 2022-11-11 MED ORDER — SODIUM CHLORIDE 0.9% FLUSH
10.0000 mL | Freq: Once | INTRAVENOUS | Status: AC | PRN
  Administered 2022-11-11: 10 mL
  Filled 2022-11-11: qty 10

## 2022-11-12 ENCOUNTER — Ambulatory Visit: Payer: Medicare PPO

## 2022-11-15 ENCOUNTER — Encounter: Payer: Self-pay | Admitting: Internal Medicine

## 2022-11-16 ENCOUNTER — Inpatient Hospital Stay: Payer: Medicare PPO

## 2022-11-16 ENCOUNTER — Other Ambulatory Visit: Payer: Self-pay | Admitting: Internal Medicine

## 2022-11-16 ENCOUNTER — Other Ambulatory Visit: Payer: Self-pay

## 2022-11-16 VITALS — BP 130/82 | HR 97 | Temp 98.2°F | Resp 20

## 2022-11-16 DIAGNOSIS — C3411 Malignant neoplasm of upper lobe, right bronchus or lung: Secondary | ICD-10-CM | POA: Diagnosis not present

## 2022-11-16 MED ORDER — HEPARIN SOD (PORK) LOCK FLUSH 100 UNIT/ML IV SOLN
500.0000 [IU] | Freq: Once | INTRAVENOUS | Status: AC | PRN
  Administered 2022-11-16: 500 [IU]
  Filled 2022-11-16: qty 5

## 2022-11-16 MED ORDER — SODIUM CHLORIDE 0.9 % IV SOLN
Freq: Once | INTRAVENOUS | Status: AC
  Filled 2022-11-16: qty 250

## 2022-11-16 MED ORDER — SODIUM CHLORIDE 0.9% FLUSH
10.0000 mL | Freq: Once | INTRAVENOUS | Status: AC | PRN
  Administered 2022-11-16: 10 mL
  Filled 2022-11-16: qty 10

## 2022-11-17 ENCOUNTER — Other Ambulatory Visit: Payer: Self-pay | Admitting: *Deleted

## 2022-11-17 MED ORDER — ESZOPICLONE 1 MG PO TABS: ORAL_TABLET | ORAL | 1 refills | Status: DC

## 2022-11-17 NOTE — Telephone Encounter (Signed)
Received incoming fax from pharmacy- requesting RF for metformin. Dr. B please advise.

## 2022-11-18 ENCOUNTER — Inpatient Hospital Stay: Payer: Medicare PPO

## 2022-11-18 ENCOUNTER — Encounter: Payer: Self-pay | Admitting: Internal Medicine

## 2022-11-18 ENCOUNTER — Inpatient Hospital Stay (HOSPITAL_BASED_OUTPATIENT_CLINIC_OR_DEPARTMENT_OTHER): Payer: Medicare PPO | Admitting: Internal Medicine

## 2022-11-18 ENCOUNTER — Ambulatory Visit: Payer: Medicare PPO

## 2022-11-18 VITALS — BP 131/77 | HR 90 | Temp 97.2°F | Resp 18 | Wt 170.7 lb

## 2022-11-18 DIAGNOSIS — C3411 Malignant neoplasm of upper lobe, right bronchus or lung: Secondary | ICD-10-CM

## 2022-11-18 LAB — COMPREHENSIVE METABOLIC PANEL
ALT: 7 U/L (ref 0–44)
AST: 14 U/L — ABNORMAL LOW (ref 15–41)
Albumin: 3.3 g/dL — ABNORMAL LOW (ref 3.5–5.0)
Alkaline Phosphatase: 97 U/L (ref 38–126)
Anion gap: 10 (ref 5–15)
BUN: 7 mg/dL — ABNORMAL LOW (ref 8–23)
CO2: 26 mmol/L (ref 22–32)
Calcium: 8.6 mg/dL — ABNORMAL LOW (ref 8.9–10.3)
Chloride: 103 mmol/L (ref 98–111)
Creatinine, Ser: 0.82 mg/dL (ref 0.61–1.24)
GFR, Estimated: >60 mL/min (ref >60)
Glucose, Bld: 73 mg/dL (ref 70–99)
Potassium: 3.4 mmol/L — ABNORMAL LOW (ref 3.5–5.1)
Sodium: 139 mmol/L (ref 135–145)
Total Bilirubin: 0.4 mg/dL (ref 0.3–1.2)
Total Protein: 6.7 g/dL (ref 6.5–8.1)

## 2022-11-18 LAB — CBC WITH DIFFERENTIAL/PLATELET
Abs Immature Granulocytes: 0.02 10*3/uL (ref 0.00–0.07)
Basophils Absolute: 0.1 10*3/uL (ref 0.0–0.1)
Basophils Relative: 1 %
Eosinophils Absolute: 0.8 10*3/uL — ABNORMAL HIGH (ref 0.0–0.5)
Eosinophils Relative: 12 %
HCT: 31.7 % — ABNORMAL LOW (ref 39.0–52.0)
Hemoglobin: 10.3 g/dL — ABNORMAL LOW (ref 13.0–17.0)
Immature Granulocytes: 0 %
Lymphocytes Relative: 14 %
Lymphs Abs: 0.9 10*3/uL (ref 0.7–4.0)
MCH: 28.9 pg (ref 26.0–34.0)
MCHC: 32.5 g/dL (ref 30.0–36.0)
MCV: 88.8 fL (ref 80.0–100.0)
Monocytes Absolute: 0.6 10*3/uL (ref 0.1–1.0)
Monocytes Relative: 9 %
Neutro Abs: 4.5 10*3/uL (ref 1.7–7.7)
Neutrophils Relative %: 64 %
Platelets: 217 10*3/uL (ref 150–400)
RBC: 3.57 MIL/uL — ABNORMAL LOW (ref 4.22–5.81)
RDW: 14.6 % (ref 11.5–15.5)
WBC: 7 10*3/uL (ref 4.0–10.5)
nRBC: 0 % (ref 0.0–0.2)

## 2022-11-18 LAB — TSH: TSH: 3.745 u[IU]/mL (ref 0.350–4.500)

## 2022-11-18 MED ORDER — SODIUM CHLORIDE 0.9% FLUSH
10.0000 mL | Freq: Once | INTRAVENOUS | Status: AC
  Administered 2022-11-18: 10 mL via INTRAVENOUS
  Filled 2022-11-18: qty 10

## 2022-11-18 MED ORDER — SODIUM CHLORIDE 0.9 % IV SOLN
Freq: Once | INTRAVENOUS | Status: AC
  Filled 2022-11-18: qty 250

## 2022-11-18 MED ORDER — SODIUM CHLORIDE 0.9 % IV SOLN
200.0000 mg | Freq: Once | INTRAVENOUS | Status: AC
  Administered 2022-11-18: 200 mg via INTRAVENOUS
  Filled 2022-11-18: qty 8

## 2022-11-18 MED ORDER — HEPARIN SOD (PORK) LOCK FLUSH 100 UNIT/ML IV SOLN
500.0000 [IU] | Freq: Once | INTRAVENOUS | Status: AC | PRN
  Administered 2022-11-18: 500 [IU]
  Filled 2022-11-18: qty 5

## 2022-11-18 NOTE — Assessment & Plan Note (Addendum)
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   May 05, 2022-s/p bronchoscopy- with Dr.Aleskerov-significant purulent/necrotic debris noted. Dec 23rd, 2023- PET scan: The cavitary process in the right upper lobe is similar in size to previous CTS, although demonstrates prominent peripheral hypermetabolic activity. 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 Hypermetabolic activity within the adjacent upper right lateral chest wall with right 3rd through 5th rib deformities, suspicious for direct tumor spread. No evidence of distant osseous metastatic disease; Hypermetabolic left hilar lymph node worrisome for metastatic disease. No other hypermetabolic lymph nodes or distant metastases identified.  Bronchoscopy/EBUS [facility] 26th, 2024- Dr.A]- BAL/ 4R-lymph node FNA negative for malignancy.  Patient declined further invasive procedures.  # After multiple discussions back-and-forth-patient finally decided to proceed with single agent Keytruda given the imaging findings concerning for recurrence.   # Proceed with Keytruda today. Labs today reviewed;  acceptable for treatment today. Labs today reviewed;  acceptable for treatment today. Will plan to re-image after 3 cycles of  [place].    #Right chest wall pain-/pleuritic-rib destruction malignancy versus radiation- OFF fenatny patch 25 mcg.  Oxycodone 5 mg every 8 to 12 hours hours as needed overall-  stable.   # COPD/fatigue- [coughing/phlegm/ no fevers]- on albuterol/advair [smoking]- TSH- WNL;Continue  Xopenex/ipratropium nebs-; Mucinex- DM BID. stable.   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 Bary Castilla.  CT OCt 5th,  2023-Filling defect in the cecum near the appendiceal orifice measuring about 3.4 by 2.9 cm, concerning for mass,. DEC 5366- Hypermetabolic cecal mass suspicious for a villous adenoma or colon cancer causing obstruction of the appendiceal orifice.Patient not too keen on any surgical interventions at this time.  stable.   # Poorly controlled diabetes-blood sugars 113- 280;  Continue glipizide; and  Metformin. stable.   # IV mediport: functioning/Stable.  # Hypokalemia-continue potassium 20 mEq liquid.- Stable.  # Insomnia-continue Lunesta refill.  *appt thru mychart   # DISPOSITION:  # Keytruda today;  IVFs over 1 hour.  # in 1 week- IVFs-1 lit /1 hourTuesdays/Thursday   # in 2 weeks-  IVFs-1 lit /1 hourTuesdays/Thursday   # # in 3 weeks-  IVFs-1 lit /1 hourTuesdays/Thursday  # cancel the appt MD; for 2/27.   # follow up in 3 weeks- MD; labs- cbc/cmp; TSH; keytruda; VitD 25-OH-   IVFs-1 lit /1 hour; - Dr.B

## 2022-11-18 NOTE — Progress Notes (Signed)
Pt doing fairly well. Chronic cough, wheezing at times.Has not needed his oxygen in 2 weeks. Appetite is 60 %. Low stamina.

## 2022-11-18 NOTE — Progress Notes (Signed)
[name] NOTE  Patient Care Team: [name], MD as PCP - General (Family Medicine) [name], RN as Oncology Nurse Navigator [name], [name], MD as Consulting Physician (Urology) [name], MD as Consulting Physician (Oncology) Ottie Glazier, MD as Consulting Physician (Pulmonary Disease)  CHIEF COMPLAINTS/PURPOSE OF CONSULTATION: lung cancer    Oncology History Overview Note  AUG 2022- 8.2 x 5.9 cm spiculated mass noted in right upper lobe consistent with malignancy. It extends from the right hilum to the lateral chest wall and results in lytic destruction of the lateral portion of the right fourth rib.  Mediastinal lymph nodes are noted, including 12 mm right hilar lymph node, consistent with metastatic disease. 3 cm right adrenal mass is noted concerning for metastatic disease.  # AUG 2022- PET IMPRESSION: Peripherally hypermetabolic right upper lobe mass, likely due to central necrosis. Mass extends into the right hilum and right lateral chest wall involving the second through fourth right ribs.   Mildly enlarged and hypermetabolic right hilar lymph node.   No evidence of metastatic disease in the abdomen or pelvis.  # AUG, 2022-RIGHT CHEST WALL Bx-necrosis; suspicious for malignancy not definitive [discussed with Dr.Kraynie;MDT] LUNG MASS, RIGHT; BIOPSY:  - SUSPICIOUS FOR MALIGNANCY.  - SEE COMMENT.   Comment:  Approximately six tissue cores demonstrate inflamed fibrous capsule with  hemosiderin-laden macrophages, in a background of abundant necrosis.  One of the cores demonstrates a minute fragment of viable epithelial  cells.  These cells are positive for p40.  They are negative for TTF1  and CK7. The cells of interest disappear on deeper cut levels.  While  the findings are suspicious for squamous cell carcinoma, there is not  enough viable tumor for a definitive diagnosis.  Additional tissue  sampling may be helpful.    # SEP 2022-cycle #2 Taxol hypersensitive reaction.  Switched over to #3 cycle- carbo-Abraxane starting 06/29/2021.  Discontinued IMFINZI therapy-December 2022 given poor tolerance/pneumonia admission to hospital.  #  [name] was in May-June 2023 x3 doses; stopped because of intolerance-fatigue patient preference/lack of progression.  # JAN 2024-bronchoscopy biopsy negative for malignancy; however PET scan concerning for recurrence/progression.  # FEB 15t, 2024- RE-Start Keytruda.   # DEC 2022- s/p ID- Dr.ravishankar- ? Lung abscess-no antibiotics-  JAN 2023- CT scan incidental- cecal mass- colonoscopy[Dr.Russo; [facility]- high grade dysplasia]  S/p evaluation with Dr. [name] surgery for now]   # S/p evaluation with Dr. [name].   Primary cancer of right upper lobe of lung ([place])  06/03/2021 Initial Diagnosis   Primary cancer of right upper lobe of lung ([place])   06/03/2021 Cancer Staging   Staging form: Lung, AJCC 8th Edition - Clinical: Stage IIIB (cT3, cN2, cM0) - Signed by [name], MD on 06/03/2021   06/15/2021 - 08/03/2021 Chemotherapy   Patient is on Treatment Plan : LUNG Carboplatin / Paclitaxel + XRT q7d     10/02/2021 - 10/19/2021 Chemotherapy   Patient is on Treatment Plan : LUNG Durvalumab q14d     02/03/2022 -  Chemotherapy   Patient is on Treatment Plan : LUNG NSCLC Pembrolizumab (200) q21d     02/04/2022 - 03/18/2022 Chemotherapy   Patient is on Treatment Plan : LUNG NSCLC Pembrolizumab (200) q21d       HISTORY OF PRESENTING ILLNESS: Patient is ambulating independently.  Accompanied by wife.  Rodney Vega 67 y.o.  male history of smoking -"lung cancer" [Limited necrotic tissue]-locally advanced unresectable; and Cecal mass-high grade dysplasia intact-  currently on surveillance is here to proceed with [facility] given the concern for recurrence based on imaging/PET scan in January.  Pt doing fairly well. Chronic cough, wheezing at times. Patient has not  needed his oxygen in 2 weeks.   Appetite is 60 %. Low stamina.  He continues to get IV fluids-twice a week-feels improved after IV infusions.  Chronic right chest wall pain for which patient is taking Oxycodone 5 mg twice with occasional mid day dose if needed with good pain control. 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     Denies abdominal pain nausea vomiting.  No blood in stools black or stools.  Review of Systems  Constitutional:  Positive for malaise/fatigue and weight loss. Negative for chills, diaphoresis and fever.  HENT:  Negative for nosebleeds and sore throat.   Eyes:  Negative for double vision.  Respiratory:  Positive for cough, sputum production and shortness of breath. Negative for hemoptysis and wheezing.   Cardiovascular:  Positive for chest pain. Negative for palpitations, orthopnea and leg swelling.  Gastrointestinal:  Positive for nausea. Negative for abdominal pain, blood in stool, constipation, diarrhea, heartburn, melena and vomiting.  Genitourinary:  Negative for dysuria, frequency and urgency.  Musculoskeletal:  Negative for back pain and joint pain.  Skin: Negative.  Negative for itching and rash.  Neurological:  Positive for tingling. Negative for dizziness, focal weakness, weakness and headaches.  Endo/Heme/Allergies:  Does not bruise/bleed easily.  Psychiatric/Behavioral:  Negative for depression. The patient is not nervous/anxious and does not have insomnia.      MEDICAL HISTORY:  Past Medical History:  Diagnosis Date   Acute on chronic respiratory failure (HCC)    Anemia    Cancer ([place])    Colostomy present ([place])    COPD (chronic obstructive pulmonary disease) ([place])    Degenerative arthritis of knee    Depression    Diabetes mellitus without complication ([place])    Diverticulitis    Dyspnea    Glaucoma    since 2004   Hernia 1979   History of kidney stones    History of methicillin resistant staphylococcus aureus (MRSA)    Hypertension     Hypokalemia    Marijuana use    Mass of cecum    Nephrolithiasis    Neuromuscular disorder ([place])    Personal history of tobacco use, presenting hazards to health    Pneumonia    Port-A-Cath in place    Pre-diabetes    Primary cancer of right upper lobe of lung ([place])    SCC of lung (small cell carcinoma) ([place])    Screening for obesity    Severe sepsis ([place])    Special screening for malignant neoplasms, colon    Tobacco use    Wears dentures    full upper and lower    SURGICAL HISTORY: Past Surgical History:  Procedure Laterality Date   [place], 2012   lumbar bulging disc   BRONCHOSCOPY  05/05/2022   CATARACT EXTRACTION W/PHACO Left 01/20/2021   Procedure: CATARACT EXTRACTION PHACO AND INTRAOCULAR LENS PLACEMENT ([place]) LEFT;  Surgeon: Birder Robson, MD;  Location: [place];  Service: Ophthalmology;  Laterality: Left;  10.13 0:52.1   COLONOSCOPY  9937,1696   [facility]/Dr. Buelah Manis sessile polyp,84mm, found in rectum,multiple diverticula were found in the sigmoid, in descending and in transverse colon   COLONOSCOPY WITH PROPOFOL N/A 10/22/2021   Procedure: COLONOSCOPY WITH PROPOFOL;  Surgeon: Annamaria Helling, DO;  Location[facility] ENDOSCOPY;  Service:  Gastroenterology;  Laterality: N/A;  DM   COLOSTOMY  04/20/2013   EYE SURGERY     HERNIA REPAIR  1979   inguinal   IR IMAGING GUIDED PORT INSERTION  06/10/2021   JOINT REPLACEMENT Right    knee   KNEE ARTHROPLASTY Right 05/08/2018   Procedure: COMPUTER ASSISTED TOTAL KNEE ARTHROPLASTY;  Surgeon: Dereck Leep, MD;  Location: [facility] ORS;  Service: Orthopedics;  Laterality: Right;   KNEE ARTHROPLASTY Left 11/16/2019   Procedure: COMPUTER ASSISTED TOTAL KNEE ARTHROPLASTY;  Surgeon: Dereck Leep, MD;  Location: [facility];  Service: Orthopedics;  Laterality: Left;   LAPAROTOMY  04/20/2013   colon resection with colosotmy   LITHOTRIPSY  2013   LUNG BIOPSY  2023   RIGID BRONCHOSCOPY N/A 10/29/2022   Procedure:  RIGID BRONCHOSCOPY;  Surgeon: Ottie Glazier, MD;  Location: [facility] ORS;  Service: Thoracic;  Laterality: N/A;   [place] WITH ENDOBRONCHIAL NAVIGATION N/A 05/05/2022   Procedure: VIDEO BRONCHOSCOPY WITH ENDOBRONCHIAL NAVIGATION;  Surgeon: Ottie Glazier, MD;  Location: [facility];  Service: Thoracic;  Laterality: N/A;   VIDEO BRONCHOSCOPY WITH ENDOBRONCHIAL ULTRASOUND N/A 05/05/2022   Procedure: VIDEO BRONCHOSCOPY WITH ENDOBRONCHIAL ULTRASOUND;  Surgeon: Ottie Glazier, MD;  Location: [facility];  Service: Thoracic;  Laterality: N/A;   VIDEO BRONCHOSCOPY WITH ENDOBRONCHIAL ULTRASOUND N/A 10/29/2022   Procedure: VIDEO BRONCHOSCOPY WITH ENDOBRONCHIAL ULTRASOUND;  Surgeon: Ottie Glazier, MD;  Location: [facility];  Service: Thoracic;  Laterality: N/A;    SOCIAL HISTORY: Social History   Socioeconomic History   Marital status: Married    Spouse name: Pegge   Number of children: Not on file   Years of education: Not on file   Highest education level: Not on file  Occupational History   Not on file  Tobacco Use   Smoking status: Every Day    Packs/day: 1.00    Years: 30.00    Total pack years: 30.00    Types: Cigarettes   Smokeless tobacco: Never   Tobacco comments:    since age 89 or 28  Vaping Use   Vaping Use: Never used  Substance and Sexual Activity   Alcohol use: Not Currently    Alcohol/week: 2.0 standard drinks of alcohol    Types: 2 Standard drinks or equivalent per week    Comment: 1-2 drinks per week   Drug use: Yes    Types: Marijuana    Comment: 2-3 week   Sexual activity: Not on file  Other Topics Concern   Not on file  Social History Narrative   15 mins [place]; smoking; not much alcohol. Worked for [employer], 2019. Marland Kitchen    Social Determinants of Health   Financial Resource Strain: Low Risk  (08/10/2022)   Overall Financial Resource Strain (CARDIA)    Difficulty of Paying Living Expenses: Not very  hard  Food Insecurity: No Food Insecurity (08/10/2022)   Hunger Vital Sign    Worried About Running Out of Food in the Last Year: Never true    Ran Out of Food in the Last Year: Never true  Transportation Needs: No Transportation Needs (10/12/2022)   PRAPARE - Hydrologist (Medical): No    Lack of Transportation (Non-Medical): No  Physical Activity: Not on file  Stress: No Stress Concern Present (08/10/2022)   [place]    Feeling of Stress : Not at all  Social Connections: Unknown (  08/10/2022)   Social Connection and Isolation Panel [NHANES]    Frequency of Communication with Friends and Family: Three times a week    Frequency of Social Gatherings with Friends and Family: Twice a week    Attends Religious Services: Patient refused    Marine scientist or Organizations: No    Attends Archivist Meetings: Never    Marital Status: Married  Human resources officer Violence: Not At Risk (08/10/2022)   Humiliation, Afraid, Rape, and Kick questionnaire    Fear of Current or Ex-Partner: No    Emotionally Abused: No    Physically Abused: No    Sexually Abused: No    FAMILY HISTORY: Family History  Problem Relation Age of Onset   Diabetes Mother    Heart disease Mother    Heart attack Father    Prostate cancer Father     ALLERGIES:  is allergic to paclitaxel, ambien [zolpidem], nsaids, and aleve [naproxen sodium].  MEDICATIONS:  Current Outpatient Medications  Medication Sig Dispense Refill   Accu-Chek Softclix Lancets lancets USE AS DIRECTED FOUR TIMES DAILY 100 each 12   acetaminophen (TYLENOL) 500 MG tablet Take 2 tablets (1,000 mg total) by mouth daily as needed. Home med. 30 tablet 0   albuterol (PROVENTIL) (2.5 MG/3ML) 0.083% nebulizer solution Take 2.5 mg by nebulization every 4 (four) hours as needed for wheezing or shortness of breath.     albuterol (VENTOLIN HFA) 108 (90  Base) MCG/ACT inhaler SMARTSIG:2 inhalation Via Inhaler Every 6 Hours PRN (Patient taking differently: 1-2 puffs every 6 (six) hours as needed. SMARTSIG:2 inhalation Via Inhaler Every 6 Hours PRN) 1 each 2   blood glucose meter kit and supplies KIT Check your blood glucose levels twice a day; once in the morning before breakfast; and once in the evening after dinner. 1 each 0   BREZTRI AEROSPHERE 160-9-4.8 MCG/ACT AERO Inhale 2 puffs into the lungs 2 (two) times daily.     clobetasol cream (TEMOVATE) 1.93 % Apply 1 Application topically 2 (two) times daily. 30 g 2   dextromethorphan-guaiFENesin (MUCINEX DM) 30-600 MG 12hr tablet Take 1 tablet by mouth 2 (two) times daily as needed for cough. (Patient not taking: Reported on 10/05/2022)     dorzolamide-timolol (COSOPT) 22.3-6.8 MG/ML ophthalmic solution Place 1 drop into both eyes 2 (two) times daily.     eszopiclone (LUNESTA) 1 MG TABS tablet TAKE 1 TABLET(1 MG) BY MOUTH AT BEDTIME AS NEEDED FOR SLEEP 30 tablet 1   glipiZIDE (GLUCOTROL) 5 MG tablet TAKE 1 TABLET(5 MG) BY MOUTH DAILY BEFORE BREAKFAST 90 tablet 2   glucose blood test strip Check your blood glucose levels twice a day; once in the morning before breakfast; and once in the evening after dinner. 100 each 12   guaiFENesin-codeine 100-10 MG/5ML syrup Take 5 mLs by mouth daily as needed for cough. (Patient not taking: Reported on 10/05/2022) 120 mL 0   Hydrocod Polst-Chlorphen Polst (CHLORPHENIRAMINE-HYDROCODONE) 10-8 MG/5ML Take 5 mLs by mouth at bedtime as needed for cough. 140 mL 0   ipratropium-albuterol (DUONEB) 0.5-2.5 (3) MG/3ML SOLN Take 3 mLs by nebulization every 6 (six) hours as needed. 360 mL 1   latanoprost (XALATAN) 0.005 % ophthalmic solution Place 1 drop into both eyes at bedtime.     lidocaine-prilocaine (EMLA) cream Apply 1 Application topically as needed. 30 g 3   metFORMIN (GLUCOPHAGE) 500 MG tablet Take 1 tablet (500 mg total) by mouth 2 (two) times daily with a meal. 60 tablet  2   montelukast (SINGULAIR) 10 MG tablet TAKE 1 TABLET(10 MG) BY MOUTH AT BEDTIME (Patient taking differently: Take 10 mg by mouth at bedtime. TAKE 1 TABLET(10 MG) BY MOUTH AT BEDTIME) 90 tablet 0   Multiple Vitamin (MULTIVITAMIN WITH MINERALS) TABS tablet Take 1 tablet by mouth daily.     olmesartan (BENICAR) 40 MG tablet Take 1 tablet (40 mg total) by mouth every morning. 30 tablet 3   oxyCODONE (OXY IR/ROXICODONE) 5 MG immediate release tablet Take 1 tablet (5 mg total) by mouth every 8 (eight) hours as needed for severe pain. 90 tablet 0   potassium chloride 20 MEQ/15ML (10%) SOLN Take 15 mLs (20 mEq total) by mouth 2 (two) times daily. (Patient taking differently: Take 20 mEq by mouth daily.) 473 mL 2   pregabalin (LYRICA) 50 MG capsule Take 1 capsule (50 mg total) by mouth 2 (two) times daily. 60 capsule 2   prochlorperazine (COMPAZINE) 10 MG tablet Take 10 mg by mouth every 6 (six) hours as needed for nausea or vomiting.     propranolol ER (INDERAL LA) 60 MG 24 hr capsule TAKE 1 CAPSULE(60 MG) BY MOUTH DAILY (Patient taking differently: 60 mg every morning.) 30 capsule 3   Respiratory Therapy Supplies (NEBULIZER) DEVI Use as directed 1 each 0   tamsulosin (FLOMAX) 0.4 MG CAPS capsule Take 0.4 mg by mouth daily after supper.     triamcinolone ointment (KENALOG) 0.5 % Apply 1 Application topically 2 (two) times daily. 30 g 0   venlafaxine XR (EFFEXOR-XR) 75 MG 24 hr capsule Take 225 mg by mouth daily with breakfast.     vitamin B-12 (CYANOCOBALAMIN) 500 MCG tablet Take 1 tablet by mouth daily.     Vitamin D, Ergocalciferol, (DRISDOL) 1.25 MG (50000 UNIT) CAPS capsule TAKE 1 CAPSULE BY MOUTH 1 TIME A WEEK (Patient taking differently: 50,000 Units every 7 (seven) days.) 12 capsule 1   No current facility-administered medications for this visit.   Facility-Administered Medications Ordered in Other Visits  Medication Dose Route Frequency Provider Last Rate Last Admin   heparin lock flush 100  UNIT/ML injection            heparin lock flush 100 UNIT/ML injection            heparin lock flush 100 UNIT/ML injection               .  PHYSICAL EXAMINATION: ECOG PERFORMANCE STATUS: 1 - Symptomatic but completely ambulatory  Vitals:   11/18/22 0956  BP: 131/77  Pulse: 90  Resp: 18  Temp: (!) 97.2 F (36.2 C)  SpO2: 100%        Filed Weights   11/18/22 0956  Weight: 170 lb 11.2 oz (77.4 kg)         Physical Exam Vitals and nursing note reviewed.  HENT:     Head: Normocephalic and atraumatic.     Mouth/Throat:     Pharynx: Oropharynx is clear.  Eyes:     Extraocular Movements: Extraocular movements intact.     Pupils: Pupils are equal, round, and reactive to light.  Cardiovascular:     Rate and Rhythm: Normal rate and regular rhythm.  Pulmonary:     Comments: Decreased breath sounds bilaterally.  Abdominal:     Palpations: Abdomen is soft.  Musculoskeletal:        General: Normal range of motion.     Cervical back: Normal range of motion.  Skin:    General: Skin is  warm.  Neurological:     General: No focal deficit present.     Mental Status: He is alert and oriented to person, place, and time.  Psychiatric:        Behavior: Behavior normal.        Judgment: Judgment normal.      LABORATORY DATA:  I have reviewed the data as listed Lab Results  Component Value Date   WBC 7.0 11/18/2022   HGB 10.3 (L) 11/18/2022   HCT 31.7 (L) 11/18/2022   MCV 88.8 11/18/2022   PLT 217 11/18/2022   Recent Labs    10/05/22 0938 11/02/22 0925 11/18/22 0930  NA 137 138 139  K 3.5 3.4* 3.4*  CL 102 102 103  CO2 25 27 26   GLUCOSE 134* 104* 73  BUN 13 9 7*  CREATININE 1.10 0.96 0.82  CALCIUM 8.7* 8.6* 8.6*  GFRNONAA >60 >60 >60  PROT 7.3 7.1 6.7  ALBUMIN 3.6 3.4* 3.3*  AST 16 13* 14*  ALT 8 8 7   ALKPHOS 94 98 97  BILITOT 0.4 0.3 0.4    RADIOGRAPHIC STUDIES: I have personally reviewed the radiological images as listed and agreed with the  findings in the report. DG Chest Portable 1 View  Result Date: 10/29/2022 CLINICAL DATA:  Status post bronchoscopy. EXAM: PORTABLE CHEST 1 VIEW COMPARISON:  September 16, 2022. FINDINGS: Grossly stable cavitary abnormality seen in the right upper lobe. Left internal jugular Port-A-Cath is unchanged. Left lung is clear. No pneumothorax or pleural effusion is noted. Stable old right rib fractures. IMPRESSION: No definite pneumothorax is noted status post bronchoscopy. Grossly stable cavitary lesion seen in right upper lobe. Electronically Signed   By: Marijo Conception M.D.   On: 10/29/2022 15:12    ASSESSMENT & PLAN:   Primary cancer of right upper lobe of lung ([place]) # UNRESECTABLE/LOCALLY ADVANCED- Right upper lobe lung mass -concerning for malignancy [biopsy inconclusive/atypical cells]-  April 2023- PD-L1 positive [cirucologene].   May 05, 2022-s/p bronchoscopy- with Dr.Aleskerov-significant purulent/necrotic debris noted. Dec 23rd, 2023- PET scan: The cavitary process in the right upper lobe is similar in size to previous CTS, although demonstrates prominent peripheral hypermetabolic activity. 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 Hypermetabolic activity within the adjacent upper right lateral chest wall with right 3rd through 5th rib deformities, suspicious for direct tumor spread. No evidence of distant osseous metastatic disease; Hypermetabolic left hilar lymph node worrisome for metastatic disease. No other hypermetabolic lymph nodes or distant metastases identified.  Bronchoscopy/EBUS [facility] 26th, 2024- Dr.A]- BAL/ 4R-lymph node FNA negative for malignancy.  Patient declined further invasive procedures.  # After multiple discussions back-and-forth-patient finally decided to proceed with single agent Keytruda given the imaging findings concerning for recurrence.   #  Proceed with Keytruda today. Labs today reviewed;  acceptable for treatment today. Labs today reviewed;  acceptable for treatment today. Will plan to re-image after 3 cycles of  [place].    #Right chest wall pain-/pleuritic-rib destruction malignancy versus radiation- OFF fenatny patch 25 mcg.  Oxycodone 5 mg every 8 to 12 hours hours as needed overall-  stable.   # COPD/fatigue- [coughing/phlegm/ no fevers]- on albuterol/advair [smoking]- TSH- WNL;Continue  Xopenex/ipratropium nebs-; Mucinex- DM BID. stable.   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 Bary Castilla.  CT OCt 5th, 2023-Filling defect in the cecum near the appendiceal  orifice measuring about 3.4 by 2.9 cm, concerning for mass,. DEC 4401- Hypermetabolic cecal mass suspicious for a villous adenoma or colon cancer causing obstruction of the appendiceal orifice.Patient not too keen on any surgical interventions at this time.  stable.   # Poorly controlled diabetes-blood sugars 113- 280;  Continue glipizide; and  Metformin. stable.   # IV mediport: functioning/Stable.  # Hypokalemia-continue potassium 20 mEq liquid.- Stable.  # Insomnia-continue Lunesta refill.  *appt thru mychart   # DISPOSITION:  # Keytruda today;  IVFs over 1 hour.  # in 1 week- IVFs-1 lit /1 hourTuesdays/Thursday   # in 2 weeks-  IVFs-1 lit /1 hourTuesdays/Thursday   # # in 3 weeks-  IVFs-1 lit /1 hourTuesdays/Thursday  # cancel the appt MD; for 2/27.   # follow up in 3 weeks- MD; labs- cbc/cmp; TSH; keytruda; VitD 25-OH-   IVFs-1 lit /1 hour; - Dr.B     All questions were answered. The patient knows to call the clinic with any problems, questions or concerns.    [name], MD 11/18/2022 12:54 PM

## 2022-11-23 ENCOUNTER — Inpatient Hospital Stay: Payer: Medicare PPO

## 2022-11-23 VITALS — BP 122/69 | HR 102 | Temp 97.3°F | Resp 20 | Wt 168.8 lb

## 2022-11-23 DIAGNOSIS — C3411 Malignant neoplasm of upper lobe, right bronchus or lung: Secondary | ICD-10-CM | POA: Diagnosis not present

## 2022-11-23 MED ORDER — SODIUM CHLORIDE 0.9 % IV SOLN
Freq: Once | INTRAVENOUS | Status: AC
  Filled 2022-11-23: qty 250

## 2022-11-23 MED ORDER — HEPARIN SOD (PORK) LOCK FLUSH 100 UNIT/ML IV SOLN
500.0000 [IU] | Freq: Once | INTRAVENOUS | Status: AC | PRN
  Administered 2022-11-23: 500 [IU]
  Filled 2022-11-23: qty 5

## 2022-11-23 MED ORDER — SODIUM CHLORIDE 0.9% FLUSH
10.0000 mL | Freq: Once | INTRAVENOUS | Status: AC | PRN
  Administered 2022-11-23: 10 mL
  Filled 2022-11-23: qty 10

## 2022-11-25 ENCOUNTER — Inpatient Hospital Stay: Payer: Medicare PPO

## 2022-11-25 VITALS — BP 123/76 | HR 93 | Temp 97.7°F | Resp 20

## 2022-11-25 DIAGNOSIS — C3411 Malignant neoplasm of upper lobe, right bronchus or lung: Secondary | ICD-10-CM | POA: Diagnosis not present

## 2022-11-25 MED ORDER — SODIUM CHLORIDE 0.9 % IV SOLN
Freq: Once | INTRAVENOUS | Status: AC
  Filled 2022-11-25: qty 250

## 2022-11-25 MED ORDER — HEPARIN SOD (PORK) LOCK FLUSH 100 UNIT/ML IV SOLN
500.0000 [IU] | Freq: Once | INTRAVENOUS | Status: AC | PRN
  Administered 2022-11-25: 500 [IU]
  Filled 2022-11-25: qty 5

## 2022-11-25 MED ORDER — SODIUM CHLORIDE 0.9% FLUSH
10.0000 mL | Freq: Once | INTRAVENOUS | Status: AC | PRN
  Administered 2022-11-25: 10 mL
  Filled 2022-11-25: qty 10

## 2022-11-30 ENCOUNTER — Inpatient Hospital Stay: Payer: Medicare PPO

## 2022-11-30 ENCOUNTER — Ambulatory Visit: Payer: Medicare PPO | Admitting: Internal Medicine

## 2022-11-30 VITALS — BP 132/77 | HR 86 | Temp 97.7°F | Resp 20

## 2022-11-30 DIAGNOSIS — C3411 Malignant neoplasm of upper lobe, right bronchus or lung: Secondary | ICD-10-CM

## 2022-11-30 DIAGNOSIS — E876 Hypokalemia: Secondary | ICD-10-CM

## 2022-11-30 LAB — CBC WITH DIFFERENTIAL/PLATELET
Abs Immature Granulocytes: 0.02 10*3/uL (ref 0.00–0.07)
Basophils Absolute: 0 10*3/uL (ref 0.0–0.1)
Basophils Relative: 1 %
Eosinophils Absolute: 0.1 10*3/uL (ref 0.0–0.5)
Eosinophils Relative: 2 %
HCT: 30.8 % — ABNORMAL LOW (ref 39.0–52.0)
Hemoglobin: 9.9 g/dL — ABNORMAL LOW (ref 13.0–17.0)
Immature Granulocytes: 0 %
Lymphocytes Relative: 14 %
Lymphs Abs: 0.8 10*3/uL (ref 0.7–4.0)
MCH: 28.3 pg (ref 26.0–34.0)
MCHC: 32.1 g/dL (ref 30.0–36.0)
MCV: 88 fL (ref 80.0–100.0)
Monocytes Absolute: 0.6 10*3/uL (ref 0.1–1.0)
Monocytes Relative: 10 %
Neutro Abs: 4 10*3/uL (ref 1.7–7.7)
Neutrophils Relative %: 73 %
Platelets: 182 10*3/uL (ref 150–400)
RBC: 3.5 MIL/uL — ABNORMAL LOW (ref 4.22–5.81)
RDW: 14.2 % (ref 11.5–15.5)
WBC: 5.6 10*3/uL (ref 4.0–10.5)
nRBC: 0 % (ref 0.0–0.2)

## 2022-11-30 LAB — COMPREHENSIVE METABOLIC PANEL
ALT: 7 U/L (ref 0–44)
AST: 16 U/L (ref 15–41)
Albumin: 3.3 g/dL — ABNORMAL LOW (ref 3.5–5.0)
Alkaline Phosphatase: 98 U/L (ref 38–126)
Anion gap: 8 (ref 5–15)
BUN: 7 mg/dL — ABNORMAL LOW (ref 8–23)
CO2: 25 mmol/L (ref 22–32)
Calcium: 8.6 mg/dL — ABNORMAL LOW (ref 8.9–10.3)
Chloride: 102 mmol/L (ref 98–111)
Creatinine, Ser: 0.99 mg/dL (ref 0.61–1.24)
GFR, Estimated: >60 mL/min (ref >60)
Glucose, Bld: 163 mg/dL — ABNORMAL HIGH (ref 70–99)
Potassium: 3.2 mmol/L — ABNORMAL LOW (ref 3.5–5.1)
Sodium: 135 mmol/L (ref 135–145)
Total Bilirubin: 0.5 mg/dL (ref 0.3–1.2)
Total Protein: 7 g/dL (ref 6.5–8.1)

## 2022-11-30 LAB — IRON AND TIBC
Iron: 45 ug/dL (ref 45–182)
Saturation Ratios: 17 % — ABNORMAL LOW (ref 17.9–39.5)
TIBC: 269 ug/dL (ref 250–450)
UIBC: 224 ug/dL

## 2022-11-30 LAB — FERRITIN: Ferritin: 61 ng/mL (ref 24–336)

## 2022-11-30 MED ORDER — SODIUM CHLORIDE 0.9 % IV SOLN
Freq: Once | INTRAVENOUS | Status: AC
  Filled 2022-11-30: qty 250

## 2022-11-30 MED ORDER — POTASSIUM CHLORIDE 20 MEQ/100ML IV SOLN
20.0000 meq | Freq: Once | INTRAVENOUS | Status: AC
  Administered 2022-11-30: 20 meq via INTRAVENOUS

## 2022-11-30 MED ORDER — SODIUM CHLORIDE 0.9% FLUSH
10.0000 mL | Freq: Once | INTRAVENOUS | Status: AC
  Filled 2022-11-30: qty 10

## 2022-11-30 MED ORDER — SODIUM CHLORIDE 0.9% FLUSH
10.0000 mL | Freq: Once | INTRAVENOUS | Status: AC | PRN
  Administered 2022-11-30: 10 mL
  Filled 2022-11-30: qty 10

## 2022-11-30 MED ORDER — HEPARIN SOD (PORK) LOCK FLUSH 100 UNIT/ML IV SOLN
500.0000 [IU] | Freq: Once | INTRAVENOUS | Status: AC | PRN
  Administered 2022-11-30: 500 [IU]
  Filled 2022-11-30: qty 5

## 2022-11-30 MED ORDER — HEPARIN SOD (PORK) LOCK FLUSH 100 UNIT/ML IV SOLN
500.0000 [IU] | Freq: Once | INTRAVENOUS | Status: AC
  Filled 2022-11-30: qty 5

## 2022-12-02 ENCOUNTER — Inpatient Hospital Stay: Payer: Medicare PPO

## 2022-12-02 VITALS — BP 113/70 | HR 79 | Temp 97.4°F | Resp 20 | Wt 167.0 lb

## 2022-12-02 DIAGNOSIS — C3411 Malignant neoplasm of upper lobe, right bronchus or lung: Secondary | ICD-10-CM

## 2022-12-02 MED ORDER — HEPARIN SOD (PORK) LOCK FLUSH 100 UNIT/ML IV SOLN
500.0000 [IU] | Freq: Once | INTRAVENOUS | Status: AC | PRN
  Administered 2022-12-02: 500 [IU]
  Filled 2022-12-02: qty 5

## 2022-12-02 MED ORDER — SODIUM CHLORIDE 0.9% FLUSH
10.0000 mL | Freq: Once | INTRAVENOUS | Status: AC | PRN
  Administered 2022-12-02: 10 mL
  Filled 2022-12-02: qty 10

## 2022-12-02 MED ORDER — SODIUM CHLORIDE 0.9 % IV SOLN
Freq: Once | INTRAVENOUS | Status: AC
  Filled 2022-12-02: qty 250

## 2022-12-06 ENCOUNTER — Telehealth: Payer: Self-pay

## 2022-12-06 NOTE — Telephone Encounter (Signed)
Dr. B recommends patient to be seen in [facility], see prior message

## 2022-12-06 NOTE — Telephone Encounter (Signed)
Morning patient's wife called stating patient is in extreme pain, oxy not helping and if he can resume taking the fentanyl patch? Please advise.

## 2022-12-07 ENCOUNTER — Ambulatory Visit
Admission: RE | Admit: 2022-12-07 | Discharge: 2022-12-07 | Disposition: A | Discharge status: Home / Self Care | Payer: Medicare PPO | Attending: Hospice and Palliative Medicine | Admitting: Hospice and Palliative Medicine

## 2022-12-07 ENCOUNTER — Encounter: Payer: Self-pay | Admitting: Hospice and Palliative Medicine

## 2022-12-07 ENCOUNTER — Inpatient Hospital Stay: Payer: Medicare PPO

## 2022-12-07 ENCOUNTER — Inpatient Hospital Stay: Payer: Medicare PPO | Attending: Internal Medicine

## 2022-12-07 ENCOUNTER — Other Ambulatory Visit: Payer: Self-pay

## 2022-12-07 ENCOUNTER — Ambulatory Visit
Admission: RE | Admit: 2022-12-07 | Discharge: 2022-12-07 | Disposition: A | Discharge status: Home / Self Care | Payer: Medicare PPO | Source: Ambulatory Visit | Attending: Hospice and Palliative Medicine | Admitting: Hospice and Palliative Medicine

## 2022-12-07 ENCOUNTER — Inpatient Hospital Stay (HOSPITAL_BASED_OUTPATIENT_CLINIC_OR_DEPARTMENT_OTHER): Payer: Medicare PPO | Admitting: Hospice and Palliative Medicine

## 2022-12-07 VITALS — BP 145/118 | HR 105 | Temp 98.8°F | Resp 20 | Ht 71.0 in | Wt 165.5 lb

## 2022-12-07 DIAGNOSIS — E119 Type 2 diabetes mellitus without complications: Secondary | ICD-10-CM | POA: Insufficient documentation

## 2022-12-07 DIAGNOSIS — Z79899 Other long term (current) drug therapy: Secondary | ICD-10-CM | POA: Diagnosis not present

## 2022-12-07 DIAGNOSIS — I10 Essential (primary) hypertension: Secondary | ICD-10-CM | POA: Insufficient documentation

## 2022-12-07 DIAGNOSIS — Z7984 Long term (current) use of oral hypoglycemic drugs: Secondary | ICD-10-CM | POA: Diagnosis not present

## 2022-12-07 DIAGNOSIS — F17210 Nicotine dependence, cigarettes, uncomplicated: Secondary | ICD-10-CM | POA: Diagnosis not present

## 2022-12-07 DIAGNOSIS — C3411 Malignant neoplasm of upper lobe, right bronchus or lung: Secondary | ICD-10-CM

## 2022-12-07 DIAGNOSIS — G893 Neoplasm related pain (acute) (chronic): Secondary | ICD-10-CM | POA: Insufficient documentation

## 2022-12-07 DIAGNOSIS — J449 Chronic obstructive pulmonary disease, unspecified: Secondary | ICD-10-CM | POA: Diagnosis not present

## 2022-12-07 DIAGNOSIS — E860 Dehydration: Secondary | ICD-10-CM | POA: Diagnosis not present

## 2022-12-07 DIAGNOSIS — E1165 Type 2 diabetes mellitus with hyperglycemia: Secondary | ICD-10-CM | POA: Insufficient documentation

## 2022-12-07 DIAGNOSIS — E876 Hypokalemia: Secondary | ICD-10-CM | POA: Diagnosis not present

## 2022-12-07 DIAGNOSIS — Z8614 Personal history of Methicillin resistant Staphylococcus aureus infection: Secondary | ICD-10-CM | POA: Diagnosis not present

## 2022-12-07 MED ORDER — HYDROCOD POLI-CHLORPHE POLI ER 10-8 MG/5ML PO SUER: 5.0000 mL | Freq: Every evening | ORAL | 0 refills | Status: DC | PRN

## 2022-12-07 MED ORDER — SODIUM CHLORIDE 0.9 % IV SOLN
Freq: Once | INTRAVENOUS | Status: AC
  Filled 2022-12-07: qty 250

## 2022-12-07 MED ORDER — SODIUM CHLORIDE 0.9% FLUSH
10.0000 mL | Freq: Once | INTRAVENOUS | Status: AC | PRN
  Administered 2022-12-07: 10 mL
  Filled 2022-12-07: qty 10

## 2022-12-07 MED ORDER — FENTANYL 25 MCG/HR TD PT72: 1.0000 | MEDICATED_PATCH | TRANSDERMAL | 0 refills | Status: DC

## 2022-12-07 MED ORDER — HEPARIN SOD (PORK) LOCK FLUSH 100 UNIT/ML IV SOLN
500.0000 [IU] | Freq: Once | INTRAVENOUS | Status: AC | PRN
  Administered 2022-12-07: 500 [IU]
  Filled 2022-12-07: qty 5

## 2022-12-07 NOTE — Progress Notes (Signed)
[facility] [place]  Telephone:([phone] [phone]  Patient Care Team: [name], MD as PCP - General (Family Medicine) [name], RN as Oncology Nurse Navigator [name], MD as Consulting Physician (Urology) [name], MD as Consulting Physician (Oncology) Ottie Glazier, MD as Consulting Physician (Pulmonary Disease)   Name of the patient: Rodney Ragosta  MD:[id number]  05-22-1956   Date of visit: 12/07/22  Reason for Consult: Rodney Vega is a 67 year old man with multiple medical problems including recurrent locally advanced non-small cell lung cancer status post chemotherapy and radiation.  Interval history: Patient last saw Dr. Rogue Bussing 11/18/2022.  Patient was found to have recent recurrence on imaging with tumor invasion in the right lateral chest wall third through fifth rib deformities.  Patient underwent bronchoscopy on 10/29/2022 with lymph node FNA negative for malignancy.  Patient declined further invasive procedures.  Ultimately, patient agreed to proceed with single agent Keytruda.  Patient has had right chest wall pain.  He is on oxycodone 5 mg every 8-12 hours.  Patient/wife requested visit today to discuss pain management and consideration of resumption of fentanyl.  Patient has been off the fentanyl for about a year as it was discontinued due to overall improvement in his pain.  Additionally, patient reports that 1 to 2 weeks of worsening cough with sputum production.  Denies significant changes in shortness of breath.  He does have some wheezing for which he occasionally uses his albuterol inhaler.  Denies any neurologic complaints. Denies any easy bleeding or bruising.  No fever or chills. Denies chest pain. Denies any nausea, vomiting, constipation, or diarrhea.  Patient offers no further specific complaints today.  PAST MEDICAL HISTORY: Past Medical History:  Diagnosis Date   Acute  on chronic respiratory failure (HCC)    Anemia    Cancer ([place])    Colostomy present ([place])    COPD (chronic obstructive pulmonary disease) ([place])    Degenerative arthritis of knee    Depression    Diabetes mellitus without complication ([place])    Diverticulitis    Dyspnea    Glaucoma    since 2004   Hernia 1979   History of kidney stones    History of methicillin resistant staphylococcus aureus (MRSA)    Hypertension    Hypokalemia    Marijuana use    Mass of cecum    Nephrolithiasis    Neuromuscular disorder ([place])    Personal history of tobacco use, presenting hazards to health    Pneumonia    Port-A-Cath in place    Pre-diabetes    Primary cancer of right upper lobe of lung ([place])    SCC of lung (small cell carcinoma) ([place])    Screening for obesity    Severe sepsis ([place])    Special screening for malignant neoplasms, colon    Tobacco use    Wears dentures    full upper and lower    PAST SURGICAL HISTORY:  Past Surgical History:  Procedure Laterality Date   [place], 2012   lumbar bulging disc   BRONCHOSCOPY  05/05/2022   CATARACT EXTRACTION W/PHACO Left 01/20/2021   Procedure: CATARACT EXTRACTION PHACO AND INTRAOCULAR LENS PLACEMENT ([place]) LEFT;  Surgeon: Birder Robson, MD;  Location: [facility];  Service: Ophthalmology;  Laterality: Left;  10.13 0:52.1   COLONOSCOPY  [id number]   [facility]/Dr. Buelah Manis sessile polyp,5m, found in rectum,multiple diverticula were found in the sigmoid, in descending and in transverse colon  COLONOSCOPY WITH PROPOFOL N/A 10/22/2021   Procedure: COLONOSCOPY WITH PROPOFOL;  Surgeon: Annamaria Helling, DO;  Location: [facility];  Service: Gastroenterology;  Laterality: N/A;  DM   COLOSTOMY  04/20/2013   EYE SURGERY     HERNIA REPAIR  1979   inguinal   IR IMAGING GUIDED PORT INSERTION  06/10/2021   JOINT REPLACEMENT Right    knee   KNEE ARTHROPLASTY Right 05/08/2018   Procedure: COMPUTER ASSISTED TOTAL KNEE  ARTHROPLASTY;  Surgeon: Dereck Leep, MD;  Location: [facility];  Service: Orthopedics;  Laterality: Right;   KNEE ARTHROPLASTY Left 11/16/2019   Procedure: COMPUTER ASSISTED TOTAL KNEE ARTHROPLASTY;  Surgeon: Dereck Leep, MD;  Location: [facility];  Service: Orthopedics;  Laterality: Left;   LAPAROTOMY  04/20/2013   colon resection with colosotmy   LITHOTRIPSY  2013   LUNG BIOPSY  2023   RIGID BRONCHOSCOPY N/A 10/29/2022   Procedure: RIGID BRONCHOSCOPY;  Surgeon: Ottie Glazier, MD;  Location: [facility];  Service: Thoracic;  Laterality: N/A;   TONSILLECTOMY AND ADENOIDECTOMY  1962   VIDEO BRONCHOSCOPY WITH ENDOBRONCHIAL NAVIGATION N/A 05/05/2022   Procedure: VIDEO BRONCHOSCOPY WITH ENDOBRONCHIAL NAVIGATION;  Surgeon: Ottie Glazier, MD;  Location: [facility];  Service: Thoracic;  Laterality: N/A;   VIDEO BRONCHOSCOPY WITH ENDOBRONCHIAL ULTRASOUND N/A 05/05/2022   Procedure: VIDEO BRONCHOSCOPY WITH ENDOBRONCHIAL ULTRASOUND;  Surgeon: Ottie Glazier, MD;  Location: [facility];  Service: Thoracic;  Laterality: N/A;   VIDEO BRONCHOSCOPY WITH ENDOBRONCHIAL ULTRASOUND N/A 10/29/2022   Procedure: VIDEO BRONCHOSCOPY WITH ENDOBRONCHIAL ULTRASOUND;  Surgeon: Ottie Glazier, MD;  Location: [facility];  Service: Thoracic;  Laterality: N/A;    HEMATOLOGY/ONCOLOGY HISTORY:  Oncology History Overview Note  AUG 2022- 8.2 x 5.9 cm spiculated mass noted in right upper lobe consistent with malignancy. It extends from the right hilum to the lateral chest wall and results in lytic destruction of the lateral portion of the right fourth rib.  Mediastinal lymph nodes are noted, including 12 mm right hilar lymph node, consistent with metastatic disease. 3 cm right adrenal mass is noted concerning for metastatic disease.  # AUG 2022- PET IMPRESSION: Peripherally hypermetabolic right upper lobe mass, likely due to central necrosis. Mass extends into the right hilum and right lateral chest wall involving the second  through fourth right ribs.   Mildly enlarged and hypermetabolic right hilar lymph node.   No evidence of metastatic disease in the abdomen or pelvis.  # AUG, 2022-RIGHT CHEST WALL Bx-necrosis; suspicious for malignancy not definitive [discussed with Dr.Kraynie;MDT] LUNG MASS, RIGHT; BIOPSY:  - SUSPICIOUS FOR MALIGNANCY.  - SEE COMMENT.   Comment:  Approximately six tissue cores demonstrate inflamed fibrous capsule with  hemosiderin-laden macrophages, in a background of abundant necrosis.  One of the cores demonstrates a minute fragment of viable epithelial  cells.  These cells are positive for p40.  They are negative for TTF1  and CK7. The cells of interest disappear on deeper cut levels.  While  the findings are suspicious for squamous cell carcinoma, there is not  enough viable tumor for a definitive diagnosis.  Additional tissue  sampling may be helpful.   # SEP 2022-cycle #2 Taxol hypersensitive reaction.  Switched over to #3 cycle- carbo-Abraxane starting 06/29/2021.  Discontinued IMFINZI therapy-December 2022 given poor tolerance/pneumonia admission to hospital.  #  [name] was in May-June 2023 x3 doses; stopped because of intolerance-fatigue patient preference/lack of progression.  # JAN 2024-bronchoscopy biopsy negative for malignancy; however PET scan concerning for recurrence/progression.  # FEB  15t, 2024- RE-Start Keytruda.   # DEC 2022- s/p ID- Dr.[email or web address]- ? Lung abscess-no antibiotics-  JAN 2023- CT scan incidental- cecal mass- colonoscopy[Dr.Russo; [facility]- high grade dysplasia]  S/p evaluation with Dr. [name] surgery for now]   # S/p evaluation with Dr. [name].   Primary cancer of right upper lobe of lung ([place])  06/03/2021 Initial Diagnosis   Primary cancer of right upper lobe of lung ([place])   06/03/2021 Cancer Staging   Staging form: Lung, AJCC 8th Edition - Clinical: Stage IIIB (cT3, cN2, cM0) - Signed by [name], MD on 06/03/2021    06/15/2021 - 08/03/2021 Chemotherapy   Patient is on Treatment Plan : LUNG Carboplatin / Paclitaxel + XRT q7d     10/02/2021 - 10/19/2021 Chemotherapy   Patient is on Treatment Plan : LUNG Durvalumab q14d     02/03/2022 -  Chemotherapy   Patient is on Treatment Plan : LUNG NSCLC Pembrolizumab (200) q21d     02/04/2022 - 03/18/2022 Chemotherapy   Patient is on Treatment Plan : LUNG NSCLC Pembrolizumab (200) q21d       ALLERGIES:  is allergic to paclitaxel, ambien [zolpidem], nsaids, and aleve [naproxen sodium].  MEDICATIONS:  Current Outpatient Medications  Medication Sig Dispense Refill   Accu-Chek Softclix Lancets lancets USE AS DIRECTED FOUR TIMES DAILY 100 each 12   acetaminophen (TYLENOL) 500 MG tablet Take 2 tablets (1,000 mg total) by mouth daily as needed. Home med. 30 tablet 0   albuterol (PROVENTIL) (2.5 MG/3ML) 0.083% nebulizer solution Take 2.5 mg by nebulization every 4 (four) hours as needed for wheezing or shortness of breath.     albuterol (VENTOLIN HFA) 108 (90 Base) MCG/ACT inhaler SMARTSIG:2 inhalation Via Inhaler Every 6 Hours PRN (Patient taking differently: 1-2 puffs every 6 (six) hours as needed. SMARTSIG:2 inhalation Via Inhaler Every 6 Hours PRN) 1 each 2   blood glucose meter kit and supplies KIT Check your blood glucose levels twice a day; once in the morning before breakfast; and once in the evening after dinner. 1 each 0   BREZTRI AEROSPHERE 160-9-4.8 MCG/ACT AERO Inhale 2 puffs into the lungs 2 (two) times daily.     clobetasol cream (TEMOVATE) [id number] % Apply 1 Application topically 2 (two) times daily. 30 g 2   dextromethorphan-guaiFENesin (MUCINEX DM) 30-600 MG 12hr tablet Take 1 tablet by mouth 2 (two) times daily as needed for cough.     dorzolamide-timolol (COSOPT) 22.3-6.8 MG/ML ophthalmic solution Place 1 drop into both eyes 2 (two) times daily.     eszopiclone (LUNESTA) 1 MG TABS tablet TAKE 1 TABLET(1 MG) BY MOUTH AT BEDTIME AS NEEDED FOR SLEEP 30 tablet 1    glipiZIDE (GLUCOTROL) 5 MG tablet TAKE 1 TABLET(5 MG) BY MOUTH DAILY BEFORE BREAKFAST 90 tablet 2   glucose blood test strip Check your blood glucose levels twice a day; once in the morning before breakfast; and once in the evening after dinner. 100 each 12   guaiFENesin-codeine 100-10 MG/5ML syrup Take 5 mLs by mouth daily as needed for cough. 120 mL 0   Hydrocod Polst-Chlorphen Polst (CHLORPHENIRAMINE-HYDROCODONE) 10-8 MG/5ML Take 5 mLs by mouth at bedtime as needed for cough. 140 mL 0   ipratropium-albuterol (DUONEB) 0.5-2.5 (3) MG/3ML SOLN Take 3 mLs by nebulization every 6 (six) hours as needed. 360 mL 1   latanoprost (XALATAN) 0.005 % ophthalmic solution Place 1 drop into both eyes at bedtime.     lidocaine-prilocaine (EMLA) cream Apply 1 Application topically as needed.  30 g 3   metFORMIN (GLUCOPHAGE) 500 MG tablet Take 1 tablet (500 mg total) by mouth 2 (two) times daily with a meal. 60 tablet 2   montelukast (SINGULAIR) 10 MG tablet TAKE 1 TABLET(10 MG) BY MOUTH AT BEDTIME (Patient taking differently: Take 10 mg by mouth at bedtime. TAKE 1 TABLET(10 MG) BY MOUTH AT BEDTIME) 90 tablet 0   Multiple Vitamin (MULTIVITAMIN WITH MINERALS) TABS tablet Take 1 tablet by mouth daily.     olmesartan (BENICAR) 40 MG tablet Take 1 tablet (40 mg total) by mouth every morning. 30 tablet 3   oxyCODONE (OXY IR/ROXICODONE) 5 MG immediate release tablet Take 1 tablet (5 mg total) by mouth every 8 (eight) hours as needed for severe pain. 90 tablet 0   potassium chloride 20 MEQ/15ML (10%) SOLN Take 15 mLs (20 mEq total) by mouth 2 (two) times daily. (Patient taking differently: Take 20 mEq by mouth daily.) 473 mL 2   pregabalin (LYRICA) 50 MG capsule Take 1 capsule (50 mg total) by mouth 2 (two) times daily. 60 capsule 2   prochlorperazine (COMPAZINE) 10 MG tablet Take 10 mg by mouth every 6 (six) hours as needed for nausea or vomiting.     propranolol ER (INDERAL LA) 60 MG 24 hr capsule TAKE 1 CAPSULE(60 MG)  BY MOUTH DAILY (Patient taking differently: 60 mg every morning.) 30 capsule 3   Respiratory Therapy Supplies (NEBULIZER) DEVI Use as directed 1 each 0   tamsulosin (FLOMAX) 0.4 MG CAPS capsule Take 0.4 mg by mouth daily after supper.     triamcinolone ointment (KENALOG) 0.5 % Apply 1 Application topically 2 (two) times daily. 30 g 0   venlafaxine XR (EFFEXOR-XR) 75 MG 24 hr capsule Take 225 mg by mouth daily with breakfast.     vitamin B-12 (CYANOCOBALAMIN) 500 MCG tablet Take 1 tablet by mouth daily.     Vitamin D, Ergocalciferol, (DRISDOL) 1.25 MG (50000 UNIT) CAPS capsule TAKE 1 CAPSULE BY MOUTH 1 TIME A WEEK (Patient taking differently: 50,000 Units every 7 (seven) days.) 12 capsule 1   No current facility-administered medications for this visit.   Facility-Administered Medications Ordered in Other Visits  Medication Dose Route Frequency Provider Last Rate Last Admin   heparin lock flush 100 UNIT/ML injection            heparin lock flush 100 UNIT/ML injection            heparin lock flush 100 UNIT/ML injection             VITAL SIGNS: BP (!) 145/118   Pulse (!) 105   Temp 98.8 F (37.1 C) (Tympanic)   Resp 20   Ht '5\' 11"'$  (1.803 m)   Wt 165 lb 8 oz (75.1 kg)   SpO2 100%   BMI 23.08 kg/m  Filed Weights   12/07/22 0832  Weight: 165 lb 8 oz (75.1 kg)    Estimated body mass index is 23.08 kg/m as calculated from the following:   Height as of this encounter: '5\' 11"'$  (1.803 m).   Weight as of this encounter: 165 lb 8 oz (75.1 kg).  LABS: CBC:    Component Value Date/Time   WBC 5.6 11/30/2022 0935   HGB 9.9 (L) 11/30/2022 0935   HGB 7.5 (L) 12/01/2021 0504   HCT 30.8 (L) 11/30/2022 0935   HCT 23.7 (L) 12/01/2021 0504   PLT 182 11/30/2022 0935   PLT 243 12/01/2021 0504   MCV 88.0 11/30/2022 0935  MCV 89 12/01/2021 0504   MCV 91 09/21/2013 0522   NEUTROABS 4.0 11/30/2022 0935   NEUTROABS 9.6 (H) 12/01/2021 0504   NEUTROABS 11.3 (H) 09/21/2013 0522   LYMPHSABS 0.8  11/30/2022 0935   LYMPHSABS 0.8 12/01/2021 0504   LYMPHSABS 1.4 09/21/2013 0522   MONOABS 0.6 11/30/2022 0935   MONOABS 0.9 09/21/2013 0522   EOSABS 0.1 11/30/2022 0935   EOSABS 0.3 12/01/2021 0504   EOSABS 0.0 09/21/2013 0522   BASOSABS 0.0 11/30/2022 0935   BASOSABS 0.0 12/01/2021 0504   BASOSABS 0.1 09/21/2013 0522   Comprehensive Metabolic Panel:    Component Value Date/Time   NA 135 11/30/2022 0935   NA 138 09/20/2013 0639   K 3.2 (L) 11/30/2022 0935   K 4.0 09/20/2013 0639   CL 102 11/30/2022 0935   CL 107 09/20/2013 0639   CO2 25 11/30/2022 0935   CO2 26 09/20/2013 0639   BUN 7 (L) 11/30/2022 0935   BUN 12 09/20/2013 0639   CREATININE 0.99 11/30/2022 0935   CREATININE 0.84 09/24/2013 0515   GLUCOSE 163 (H) 11/30/2022 0935   GLUCOSE 160 (H) 09/20/2013 0639   CALCIUM 8.6 (L) 11/30/2022 0935   CALCIUM 8.2 (L) 09/20/2013 0639   AST 16 11/30/2022 0935   AST 22 01/08/2012 0939   ALT 7 11/30/2022 0935   ALT 20 01/08/2012 0939   ALKPHOS 98 11/30/2022 0935   ALKPHOS 69 01/08/2012 0939   BILITOT 0.5 11/30/2022 0935   BILITOT 1.1 (H) 01/08/2012 0939   PROT 7.0 11/30/2022 0935   PROT 6.5 09/12/2013 1002   PROT 6.9 01/08/2012 0939   ALBUMIN 3.3 (L) 11/30/2022 0935   ALBUMIN 4.4 09/12/2013 1002   ALBUMIN 3.5 01/08/2012 0939    RADIOGRAPHIC STUDIES: No results found.  PERFORMANCE STATUS (ECOG) : 1 - Symptomatic but completely ambulatory  Review of Systems Unless otherwise noted, a complete review of systems is negative.  Physical Exam General: NAD Cardiac: RRR Pulmonary: Chronically coarse to auscultation anterior/posterior fields, expiratory wheezing Abdomen: Soft, nontender to palp Extremities: no edema, no joint deformities Skin: no rashes Neurological: Grossly nonfocal  Assessment and Plan:   Neoplasm related pain-patient has right chest wall pain at known site of rib/chest wall involvement.  Patient is taking oxycodone 3 times daily with some improvement  in symptoms.  He would like to resume fentanyl as he felt that his pain was better controlled overall while taking it.  Restart fentanyl 25 mcg every 72 hours #10.  Continue oxycodone as needed for breakthrough pain.  Continue daily bowel regimen to prevent opioid-induced constipation  Dehydration/weight loss-discussed importance of high-calorie/high-protein foods.  Nutrition following.  Recommend oral nutritional supplements.  IV fluids today  Cough/wheezing -will send for chest x-ray.  Continue inhalers.  Refilled Tussionex.  Patient RTC later this week for labs/MD visit  Patient expressed understanding and was in agreement with this plan. He also understands that He can call clinic at any time with any questions, concerns, or complaints.   Thank you for allowing me to participate in the care of this very pleasant patient.   Time Total: 20 minutes  Visit consisted of counseling and education dealing with the complex and emotionally intense issues of symptom management in the setting of serious illness.Greater than 50%  of this time was spent counseling and coordinating care related to the above assessment and plan.  Signed by: Altha Harm, PhD, NP-C

## 2022-12-08 MED FILL — Dexamethasone Sodium Phosphate Inj 100 MG/10ML: INTRAMUSCULAR | Qty: 1 | Status: AC

## 2022-12-09 ENCOUNTER — Inpatient Hospital Stay: Payer: Medicare PPO

## 2022-12-09 ENCOUNTER — Inpatient Hospital Stay (HOSPITAL_BASED_OUTPATIENT_CLINIC_OR_DEPARTMENT_OTHER): Payer: Medicare PPO | Admitting: Internal Medicine

## 2022-12-09 ENCOUNTER — Encounter: Payer: Self-pay | Admitting: Internal Medicine

## 2022-12-09 VITALS — BP 141/76 | HR 109 | Temp 98.8°F | Resp 18 | Wt 166.4 lb

## 2022-12-09 DIAGNOSIS — C3411 Malignant neoplasm of upper lobe, right bronchus or lung: Secondary | ICD-10-CM | POA: Diagnosis not present

## 2022-12-09 DIAGNOSIS — E876 Hypokalemia: Secondary | ICD-10-CM

## 2022-12-09 LAB — COMPREHENSIVE METABOLIC PANEL
ALT: 7 U/L (ref 0–44)
AST: 19 U/L (ref 15–41)
Albumin: 3.3 g/dL — ABNORMAL LOW (ref 3.5–5.0)
Alkaline Phosphatase: 88 U/L (ref 38–126)
Anion gap: 11 (ref 5–15)
BUN: 7 mg/dL — ABNORMAL LOW (ref 8–23)
CO2: 24 mmol/L (ref 22–32)
Calcium: 8.4 mg/dL — ABNORMAL LOW (ref 8.9–10.3)
Chloride: 100 mmol/L (ref 98–111)
Creatinine, Ser: 0.9 mg/dL (ref 0.61–1.24)
GFR, Estimated: >60 mL/min (ref >60)
Glucose, Bld: 216 mg/dL — ABNORMAL HIGH (ref 70–99)
Potassium: 3.2 mmol/L — ABNORMAL LOW (ref 3.5–5.1)
Sodium: 135 mmol/L (ref 135–145)
Total Bilirubin: 0.3 mg/dL (ref 0.3–1.2)
Total Protein: 7 g/dL (ref 6.5–8.1)

## 2022-12-09 LAB — CBC WITH DIFFERENTIAL/PLATELET
Abs Immature Granulocytes: 0.01 10*3/uL (ref 0.00–0.07)
Basophils Absolute: 0 10*3/uL (ref 0.0–0.1)
Basophils Relative: 0 %
Eosinophils Absolute: 0 10*3/uL (ref 0.0–0.5)
Eosinophils Relative: 1 %
HCT: 30.2 % — ABNORMAL LOW (ref 39.0–52.0)
Hemoglobin: 9.7 g/dL — ABNORMAL LOW (ref 13.0–17.0)
Immature Granulocytes: 0 %
Lymphocytes Relative: 17 %
Lymphs Abs: 0.9 10*3/uL (ref 0.7–4.0)
MCH: 28.4 pg (ref 26.0–34.0)
MCHC: 32.1 g/dL (ref 30.0–36.0)
MCV: 88.3 fL (ref 80.0–100.0)
Monocytes Absolute: 0.5 10*3/uL (ref 0.1–1.0)
Monocytes Relative: 9 %
Neutro Abs: 3.7 10*3/uL (ref 1.7–7.7)
Neutrophils Relative %: 73 %
Platelets: 188 10*3/uL (ref 150–400)
RBC: 3.42 MIL/uL — ABNORMAL LOW (ref 4.22–5.81)
RDW: 14 % (ref 11.5–15.5)
WBC: 5.1 10*3/uL (ref 4.0–10.5)
nRBC: 0 % (ref 0.0–0.2)

## 2022-12-09 LAB — VITAMIN D 25 HYDROXY (VIT D DEFICIENCY, FRACTURES): Vit D, 25-Hydroxy: 105.23 ng/mL — ABNORMAL HIGH (ref 30–100)

## 2022-12-09 LAB — TSH: TSH: 2.451 u[IU]/mL (ref 0.350–4.500)

## 2022-12-09 MED ORDER — SODIUM CHLORIDE 0.9 % IV SOLN
Freq: Once | INTRAVENOUS | Status: AC
  Filled 2022-12-09: qty 250

## 2022-12-09 MED ORDER — POTASSIUM CHLORIDE 20 MEQ/100ML IV SOLN
20.0000 meq | Freq: Once | INTRAVENOUS | Status: AC
  Administered 2022-12-09: 20 meq via INTRAVENOUS

## 2022-12-09 MED ORDER — SODIUM CHLORIDE 0.9 % IV SOLN
200.0000 mg | Freq: Once | INTRAVENOUS | Status: AC
  Administered 2022-12-09: 200 mg via INTRAVENOUS
  Filled 2022-12-09: qty 8

## 2022-12-09 MED ORDER — HEPARIN SOD (PORK) LOCK FLUSH 100 UNIT/ML IV SOLN
500.0000 [IU] | Freq: Once | INTRAVENOUS | Status: AC | PRN
  Administered 2022-12-09: 500 [IU]
  Filled 2022-12-09: qty 5

## 2022-12-09 MED ORDER — SODIUM CHLORIDE 0.9% FLUSH
10.0000 mL | INTRAVENOUS | Status: DC | PRN
  Administered 2022-12-09: 10 mL
  Filled 2022-12-09: qty 10

## 2022-12-09 NOTE — Progress Notes (Signed)
Pt definitely feels weaker after [place]. Spending more time sleeping. Appetite is diminished. Intermittent pain flares in that right thoracic area. He put a pain patch on yesterday. Chronic cough, wheezing. Using nebulizer and oxygen at home as needed.

## 2022-12-09 NOTE — Progress Notes (Signed)
[name] NOTE  Patient Care Team: [name], MD as PCP - General (Family Medicine) [name], RN as Oncology Nurse Navigator [name], [name], MD as Consulting Physician (Urology) [name], MD as Consulting Physician (Oncology) Ottie Glazier, MD as Consulting Physician (Pulmonary Disease)  CHIEF COMPLAINTS/PURPOSE OF CONSULTATION: lung cancer    Oncology History Overview Note  AUG 2022- 8.2 x 5.9 cm spiculated mass noted in right upper lobe consistent with malignancy. It extends from the right hilum to the lateral chest wall and results in lytic destruction of the lateral portion of the right fourth rib.  Mediastinal lymph nodes are noted, including 12 mm right hilar lymph node, consistent with metastatic disease. 3 cm right adrenal mass is noted concerning for metastatic disease.  # AUG 2022- PET IMPRESSION: Peripherally hypermetabolic right upper lobe mass, likely due to central necrosis. Mass extends into the right hilum and right lateral chest wall involving the second through fourth right ribs.   Mildly enlarged and hypermetabolic right hilar lymph node.   No evidence of metastatic disease in the abdomen or pelvis.  # AUG, 2022-RIGHT CHEST WALL Bx-necrosis; suspicious for malignancy not definitive [discussed with Dr.Kraynie;MDT] LUNG MASS, RIGHT; BIOPSY:  - SUSPICIOUS FOR MALIGNANCY.  - SEE COMMENT.   Comment:  Approximately six tissue cores demonstrate inflamed fibrous capsule with  hemosiderin-laden macrophages, in a background of abundant necrosis.  One of the cores demonstrates a minute fragment of viable epithelial  cells.  These cells are positive for p40.  They are negative for TTF1  and CK7. The cells of interest disappear on deeper cut levels.  While  the findings are suspicious for squamous cell carcinoma, there is not  enough viable tumor for a definitive diagnosis.  Additional tissue  sampling may be helpful.    # SEP 2022-cycle #2 Taxol hypersensitive reaction.  Switched over to #3 cycle- carbo-Abraxane starting 06/29/2021.  Discontinued IMFINZI therapy-December 2022 given poor tolerance/pneumonia admission to hospital.  #  [name] was in May-June 2023 x3 doses; stopped because of intolerance-fatigue patient preference/lack of progression.  # JAN 2024-bronchoscopy biopsy negative for malignancy; however PET scan concerning for recurrence/progression.  # FEB 15t, 2024- RE-Start Keytruda.   # DEC 2022- s/p ID- Dr.ravishankar- ? Lung abscess-no antibiotics-  JAN 2023- CT scan incidental- cecal mass- colonoscopy[Dr.Russo; [facility]- high grade dysplasia]  S/p evaluation with Dr. [name] surgery for now]   # S/p evaluation with Dr. [name].   Primary cancer of right upper lobe of lung ([place])  06/03/2021 Initial Diagnosis   Primary cancer of right upper lobe of lung ([place])   06/03/2021 Cancer Staging   Staging form: Lung, AJCC 8th Edition - Clinical: Stage IIIB (cT3, cN2, cM0) - Signed by [name], MD on 06/03/2021   06/15/2021 - 08/03/2021 Chemotherapy   Patient is on Treatment Plan : LUNG Carboplatin / Paclitaxel + XRT q7d     10/02/2021 - 10/19/2021 Chemotherapy   Patient is on Treatment Plan : LUNG Durvalumab q14d     02/03/2022 -  Chemotherapy   Patient is on Treatment Plan : LUNG NSCLC Pembrolizumab (200) q21d     02/04/2022 - 03/18/2022 Chemotherapy   Patient is on Treatment Plan : LUNG NSCLC Pembrolizumab (200) q21d       HISTORY OF PRESENTING ILLNESS: Patient is ambulating independently.  Accompanied by wife.  Rodney Vega 67 y.o.  male history of smoking -"lung cancer" [Limited necrotic tissue]-locally advanced unresectable; and Cecal mass-high grade dysplasia intact-  currently on single agent [name] is here for follow-up.  Pt definitely feels weaker after [place]. Spending more time sleeping. Appetite is diminished.   In the interim patient was evaluated in the  symptom management clinic for worsening right chest wall pain. Intermittent pain flares in that right thoracic area. He put a pain patch on yesterday.   Chronic cough, wheezing. Using nebulizer and oxygen at home as needed.   He continues to get IV fluids-twice a week-feels improved after IV infusions.   Denies abdominal pain nausea vomiting or diarrhea.  No blood in stools black or stools.  Review of Systems  Constitutional:  Positive for malaise/fatigue and weight loss. Negative for chills, diaphoresis and fever.  HENT:  Negative for nosebleeds and sore throat.   Eyes:  Negative for double vision.  Respiratory:  Positive for cough, sputum production and shortness of breath. Negative for hemoptysis and wheezing.   Cardiovascular:  Positive for chest pain. Negative for palpitations, orthopnea and leg swelling.  Gastrointestinal:  Positive for nausea. Negative for abdominal pain, blood in stool, constipation, diarrhea, heartburn, melena and vomiting.  Genitourinary:  Negative for dysuria, frequency and urgency.  Musculoskeletal:  Negative for back pain and joint pain.  Skin: Negative.  Negative for itching and rash.  Neurological:  Positive for tingling. Negative for dizziness, focal weakness, weakness and headaches.  Endo/Heme/Allergies:  Does not bruise/bleed easily.  Psychiatric/Behavioral:  Negative for depression. The patient is not nervous/anxious and does not have insomnia.      MEDICAL HISTORY:  Past Medical History:  Diagnosis Date   Acute on chronic respiratory failure (HCC)    Anemia    Cancer ([place])    Colostomy present ([place])    COPD (chronic obstructive pulmonary disease) ([place])    Degenerative arthritis of knee    Depression    Diabetes mellitus without complication ([place])    Diverticulitis    Dyspnea    Glaucoma    since 2004   Hernia 1979   History of kidney stones    History of methicillin resistant staphylococcus aureus (MRSA)    Hypertension    Hypokalemia     Marijuana use    Mass of cecum    Nephrolithiasis    Neuromuscular disorder ([place])    Personal history of tobacco use, presenting hazards to health    Pneumonia    Port-A-Cath in place    Pre-diabetes    Primary cancer of right upper lobe of lung ([place])    SCC of lung (small cell carcinoma) ([place])    Screening for obesity    Severe sepsis ([place])    Special screening for malignant neoplasms, colon    Tobacco use    Wears dentures    full upper and lower    SURGICAL HISTORY: Past Surgical History:  Procedure Laterality Date   [place], 2012   lumbar bulging disc   BRONCHOSCOPY  05/05/2022   CATARACT EXTRACTION W/PHACO Left 01/20/2021   Procedure: CATARACT EXTRACTION PHACO AND INTRAOCULAR LENS PLACEMENT ([place]) LEFT;  Surgeon: Birder Robson, MD;  Location: [facility];  Service: Ophthalmology;  Laterality: Left;  10.13 0:52.1   COLONOSCOPY  [id number]   [facility]/Dr. Buelah Manis sessile polyp,36m, found in rectum,multiple diverticula were found in the sigmoid, in descending and in transverse colon   COLONOSCOPY WITH PROPOFOL N/A 10/22/2021   Procedure: COLONOSCOPY WITH PROPOFOL;  Surgeon: RAnnamaria Helling DO;  [facility];  Service: Gastroenterology;  Laterality: N/A;  DM   COLOSTOMY  04/20/2013   EYE SURGERY     HERNIA REPAIR  1979   inguinal   IR IMAGING GUIDED PORT INSERTION  06/10/2021   JOINT REPLACEMENT Right    knee   KNEE ARTHROPLASTY Right 05/08/2018   Procedure: COMPUTER ASSISTED TOTAL KNEE ARTHROPLASTY;  Surgeon: Dereck Leep, MD;  Location: [facility];  Service: Orthopedics;  Laterality: Right;   KNEE ARTHROPLASTY Left 11/16/2019   Procedure: COMPUTER ASSISTED TOTAL KNEE ARTHROPLASTY;  Surgeon: Dereck Leep, MD;  Location: [facility];  Service: Orthopedics;  Laterality: Left;   LAPAROTOMY  04/20/2013   colon resection with colosotmy   LITHOTRIPSY  2013   LUNG BIOPSY  2023   RIGID BRONCHOSCOPY N/A 10/29/2022   Procedure: RIGID  BRONCHOSCOPY;  Surgeon: Ottie Glazier, MD;  Location: [facility];  Service: Thoracic;  Laterality: N/A;   [place] WITH ENDOBRONCHIAL NAVIGATION N/A 05/05/2022   Procedure: VIDEO BRONCHOSCOPY WITH ENDOBRONCHIAL NAVIGATION;  Surgeon: Ottie Glazier, MD;  Location: [facility];  Service: Thoracic;  Laterality: N/A;   VIDEO BRONCHOSCOPY WITH ENDOBRONCHIAL ULTRASOUND N/A 05/05/2022   Procedure: VIDEO BRONCHOSCOPY WITH ENDOBRONCHIAL ULTRASOUND;  Surgeon: Ottie Glazier, MD;  Location: [facility];  Service: Thoracic;  Laterality: N/A;   VIDEO BRONCHOSCOPY WITH ENDOBRONCHIAL ULTRASOUND N/A 10/29/2022   Procedure: VIDEO BRONCHOSCOPY WITH ENDOBRONCHIAL ULTRASOUND;  Surgeon: Ottie Glazier, MD;  Location: [facility];  Service: Thoracic;  Laterality: N/A;    SOCIAL HISTORY: Social History   Socioeconomic History   Marital status: Married    Spouse name: Pegge   Number of children: Not on file   Years of education: Not on file   Highest education level: Not on file  Occupational History   Not on file  Tobacco Use   Smoking status: Every Day    Packs/day: 1.00    Years: 30.00    Total pack years: 30.00    Types: Cigarettes   Smokeless tobacco: Never   Tobacco comments:    since age 26 or 8  Vaping Use   Vaping Use: Never used  Substance and Sexual Activity   Alcohol use: Not Currently    Alcohol/week: 2.0 standard drinks of alcohol    Types: 2 Standard drinks or equivalent per week    Comment: 1-2 drinks per week   Drug use: Yes    Types: Marijuana    Comment: 2-3 week   Sexual activity: Not on file  Other Topics Concern   Not on file  Social History Narrative   15 mins [place]; smoking; not much alcohol. Worked for [employer], 2019. Marland Kitchen    Social Determinants of Health   Financial Resource Strain: Low Risk  (08/10/2022)   Overall Financial Resource Strain (CARDIA)    Difficulty of Paying Living Expenses: Not very hard   Food Insecurity: No Food Insecurity (08/10/2022)   Hunger Vital Sign    Worried About Running Out of Food in the Last Year: Never true    Ran Out of Food in the Last Year: Never true  Transportation Needs: No Transportation Needs (10/12/2022)   PRAPARE - Hydrologist (Medical): No    Lack of Transportation (Non-Medical): No  Physical Activity: Not on file  Stress: No Stress Concern Present (08/10/2022)   [place]    Feeling of Stress : Not at all  Social Connections: Unknown (08/10/2022)   Social Connection and Isolation Panel [NHANES]  Frequency of Communication with Friends and Family: Three times a week    Frequency of Social Gatherings with Friends and Family: Twice a week    Attends Religious Services: Patient refused    Marine scientist or Organizations: No    Attends Archivist Meetings: Never    Marital Status: Married  Human resources officer Violence: Not At Risk (08/10/2022)   Humiliation, Afraid, Rape, and Kick questionnaire    Fear of Current or Ex-Partner: No    Emotionally Abused: No    Physically Abused: No    Sexually Abused: No    FAMILY HISTORY: Family History  Problem Relation Age of Onset   Diabetes Mother    Heart disease Mother    Heart attack Father    Prostate cancer Father     ALLERGIES:  is allergic to paclitaxel, ambien [zolpidem], nsaids, and aleve [naproxen sodium].  MEDICATIONS:  Current Outpatient Medications  Medication Sig Dispense Refill   Accu-Chek Softclix Lancets lancets USE AS DIRECTED FOUR TIMES DAILY 100 each 12   acetaminophen (TYLENOL) 500 MG tablet Take 2 tablets (1,000 mg total) by mouth daily as needed. Home med. 30 tablet 0   albuterol (PROVENTIL) (2.5 MG/3ML) 0.083% nebulizer solution Take 2.5 mg by nebulization every 4 (four) hours as needed for wheezing or shortness of breath.     albuterol (VENTOLIN HFA) 108 (90 Base)  MCG/ACT inhaler SMARTSIG:2 inhalation Via Inhaler Every 6 Hours PRN (Patient taking differently: 1-2 puffs every 6 (six) hours as needed. SMARTSIG:2 inhalation Via Inhaler Every 6 Hours PRN) 1 each 2   blood glucose meter kit and supplies KIT Check your blood glucose levels twice a day; once in the morning before breakfast; and once in the evening after dinner. 1 each 0   BREZTRI AEROSPHERE 160-9-4.8 MCG/ACT AERO Inhale 2 puffs into the lungs 2 (two) times daily.     clobetasol cream (TEMOVATE) [id number] % Apply 1 Application topically 2 (two) times daily. 30 g 2   dorzolamide-timolol (COSOPT) 22.3-6.8 MG/ML ophthalmic solution Place 1 drop into both eyes 2 (two) times daily.     eszopiclone (LUNESTA) 1 MG TABS tablet TAKE 1 TABLET(1 MG) BY MOUTH AT BEDTIME AS NEEDED FOR SLEEP 30 tablet 1   fentaNYL (DURAGESIC) 25 MCG/HR Place 1 patch onto the skin every 3 (three) days. 10 patch 0   glipiZIDE (GLUCOTROL) 5 MG tablet TAKE 1 TABLET(5 MG) BY MOUTH DAILY BEFORE BREAKFAST 90 tablet 2   glucose blood test strip Check your blood glucose levels twice a day; once in the morning before breakfast; and once in the evening after dinner. 100 each 12   ipratropium-albuterol (DUONEB) 0.5-2.5 (3) MG/3ML SOLN Take 3 mLs by nebulization every 6 (six) hours as needed. 360 mL 1   latanoprost (XALATAN) 0.005 % ophthalmic solution Place 1 drop into both eyes at bedtime.     lidocaine-prilocaine (EMLA) cream Apply 1 Application topically as needed. 30 g 3   metFORMIN (GLUCOPHAGE) 500 MG tablet Take 1 tablet (500 mg total) by mouth 2 (two) times daily with a meal. 60 tablet 2   montelukast (SINGULAIR) 10 MG tablet TAKE 1 TABLET(10 MG) BY MOUTH AT BEDTIME (Patient taking differently: Take 10 mg by mouth at bedtime. TAKE 1 TABLET(10 MG) BY MOUTH AT BEDTIME) 90 tablet 0   Multiple Vitamin (MULTIVITAMIN WITH MINERALS) TABS tablet Take 1 tablet by mouth daily.     olmesartan (BENICAR) 40 MG tablet Take 1 tablet (40 mg total) by mouth  every morning. 30 tablet 3   oxyCODONE (OXY IR/ROXICODONE) 5 MG immediate release tablet Take 1 tablet (5 mg total) by mouth every 8 (eight) hours as needed for severe pain. 90 tablet 0   pregabalin (LYRICA) 50 MG capsule Take 1 capsule (50 mg total) by mouth 2 (two) times daily. 60 capsule 2   prochlorperazine (COMPAZINE) 10 MG tablet Take 10 mg by mouth every 6 (six) hours as needed for nausea or vomiting.     propranolol ER (INDERAL LA) 60 MG 24 hr capsule TAKE 1 CAPSULE(60 MG) BY MOUTH DAILY (Patient taking differently: 60 mg every morning.) 30 capsule 3   Respiratory Therapy Supplies (NEBULIZER) DEVI Use as directed 1 each 0   tamsulosin (FLOMAX) 0.4 MG CAPS capsule Take 0.4 mg by mouth daily after supper.     triamcinolone ointment (KENALOG) 0.5 % Apply 1 Application topically 2 (two) times daily. 30 g 0   venlafaxine XR (EFFEXOR-XR) 75 MG 24 hr capsule Take 225 mg by mouth daily with breakfast.     vitamin B-12 (CYANOCOBALAMIN) 500 MCG tablet Take 1 tablet by mouth daily.     Vitamin D, Ergocalciferol, (DRISDOL) 1.25 MG (50000 UNIT) CAPS capsule TAKE 1 CAPSULE BY MOUTH 1 TIME A WEEK (Patient taking differently: 50,000 Units every 7 (seven) days.) 12 capsule 1   chlorpheniramine-HYDROcodone (TUSSIONEX) 10-8 MG/5ML Take 5 mLs by mouth at bedtime as needed for cough. (Patient not taking: Reported on 12/09/2022) 140 mL 0   dextromethorphan-guaiFENesin (MUCINEX DM) 30-600 MG 12hr tablet Take 1 tablet by mouth 2 (two) times daily as needed for cough. (Patient not taking: Reported on 12/09/2022)     guaiFENesin-codeine 100-10 MG/5ML syrup Take 5 mLs by mouth daily as needed for cough. (Patient not taking: Reported on 12/09/2022) 120 mL 0   potassium chloride 20 MEQ/15ML (10%) SOLN Take 15 mLs (20 mEq total) by mouth 2 (two) times daily. (Patient not taking: Reported on 12/09/2022) 473 mL 2   No current facility-administered medications for this visit.   Facility-Administered Medications Ordered in Other  Visits  Medication Dose Route Frequency Provider Last Rate Last Admin   0.9 %  sodium chloride infusion   Intravenous Once Charlaine Dalton R, MD       heparin lock flush 100 UNIT/ML injection            heparin lock flush 100 UNIT/ML injection            heparin lock flush 100 UNIT/ML injection            heparin lock flush 100 unit/mL  500 Units Intracatheter Once PRN [name], MD       pembrolizumab [facility]) 200 mg in sodium chloride 0.9 % 50 mL chemo infusion  200 mg Intravenous Once Charlaine Dalton R, MD       potassium chloride 20 mEq in 100 mL IVPB  20 mEq Intravenous Once Charlaine Dalton R, MD       sodium chloride flush (NS) 0.9 % injection 10 mL  10 mL Intracatheter PRN [name], MD          .  PHYSICAL EXAMINATION: ECOG PERFORMANCE STATUS: 1 - Symptomatic but completely ambulatory  Vitals:   12/09/22 0929  BP: (!) 141/76  Pulse: (!) 109  Resp: 18  Temp: 98.8 F (37.1 C)  SpO2: 100%         Filed Weights   12/09/22 0929  Weight: 166 lb 6.4 oz (75.5 kg)  Physical Exam Vitals and nursing note reviewed.  HENT:     Head: Normocephalic and atraumatic.     Mouth/Throat:     Pharynx: Oropharynx is clear.  Eyes:     Extraocular Movements: Extraocular movements intact.     Pupils: Pupils are equal, round, and reactive to light.  Cardiovascular:     Rate and Rhythm: Normal rate and regular rhythm.  Pulmonary:     Comments: Decreased breath sounds bilaterally.  Abdominal:     Palpations: Abdomen is soft.  Musculoskeletal:        General: Normal range of motion.     Cervical back: Normal range of motion.  Skin:    General: Skin is warm.  Neurological:     General: No focal deficit present.     Mental Status: He is alert and oriented to person, place, and time.  Psychiatric:        Behavior: Behavior normal.        Judgment: Judgment normal.      LABORATORY DATA:  I have reviewed the data as  listed Lab Results  Component Value Date   WBC 5.1 12/09/2022   HGB 9.7 (L) 12/09/2022   HCT 30.2 (L) 12/09/2022   MCV 88.3 12/09/2022   PLT 188 12/09/2022   Recent Labs    11/18/22 0930 11/30/22 0935 12/09/22 0903  NA 139 135 135  K 3.4* 3.2* 3.2*  CL 103 102 100  CO2 '26 25 24  '$ GLUCOSE 73 163* 216*  BUN 7* 7* 7*  CREATININE 0.82 0.99 0.90  CALCIUM 8.6* 8.6* 8.4*  GFRNONAA >60 >60 >60  PROT 6.7 7.0 7.0  ALBUMIN 3.3* 3.3* 3.3*  AST 14* 16 19  ALT '7 7 7  '$ ALKPHOS 97 98 88  BILITOT 0.4 0.5 0.3    RADIOGRAPHIC STUDIES: I have personally reviewed the radiological images as listed and agreed with the findings in the report. DG Chest 2 View  Result Date: 12/07/2022 CLINICAL DATA:  Shortness of breath, lung carcinoma EXAM: CHEST - 2 VIEW COMPARISON:  Previous studies including the examination of 10/29/2022 FINDINGS: Cardiac size is within normal limits. Thoracic aorta is tortuous. Right lung is smaller than left. There is a cavitary lesion in the right apical region with no significant change. There is interval decrease in interstitial markings in the lower lung fields. Costophrenic angles are clear. There is no pneumothorax. Tip of chest port is seen in superior vena cava. IMPRESSION: No significant changes are noted in cavitary lesion in the right upper lung field. There are no signs of pulmonary edema or new focal infiltrates. There is no pleural effusion or pneumothorax. Electronically Signed   By: Elmer Picker M.D.   On: 12/07/2022 11:30    ASSESSMENT & PLAN:   Primary cancer of right upper lobe of lung ([place]) # UNRESECTABLE/LOCALLY ADVANCED- Right upper lobe lung mass -concerning for malignancy [biopsy inconclusive/atypical cells]-  April 2023- PD-L1 positive [cirucologene].   May 05, 2022-s/p bronchoscopy- with Dr.Aleskerov-significant purulent/necrotic debris noted. Dec 23rd, 2023- PET scan: The cavitary process in the right upper lobe is similar in size to previous CTS,  although demonstrates prominent peripheral hypermetabolic activity. 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 Hypermetabolic activity within the adjacent upper right lateral chest wall with right 3rd through 5th rib deformities, suspicious for direct tumor spread. No evidence of distant osseous metastatic disease; Hypermetabolic left hilar lymph node worrisome  for metastatic disease. No other hypermetabolic lymph nodes or distant metastases identified.  Bronchoscopy/EBUS [facility] 26th, 2024- Dr.A]- BAL/ 4R-lymph node FNA negative for malignancy.  Patient declined further invasive procedures.  # Proceed with Keytruda # 2 today. Labs today reviewed;  acceptable for treatment today. Labs today reviewed;  acceptable for treatment today. Will plan to re-image after 2-3 cycles of  [place].   # Anemia- mild to moderate- feb 2024- iron sat- 17%; ferritin- 61- recommend venofer weekly x3-    #Right chest wall pain-/pleuritic-rib destruction malignancy versus radiation- ADDED back [march 2024; [name]]-fenatny patch 25 mcg.  Oxycodone 5 mg every 8 to 12 hours hours as needed overall-  stable..   # COPD/fatigue- [coughing/phlegm/ no fevers]- on albuterol/advair [smoking]- TSH- WNL;Continue  Xopenex/ipratropium nebs-; Mucinex- DM BID. stable.   # Hypokalemia-3.2- continue potassium 20 mEq liquid.-Hypocalemia: 8.4/albumin- 3.2. on Vit D 50,K  # Cecal mass ~ 3 cm s/p colonoscopy-cecal mass clinically suggestive of malignancy-biopsy high-grade dysplasia; s/p evaluation Dr. Bary Castilla.  CT OCt 5th, 2023-Filling defect in the cecum near the appendiceal orifice measuring about 3.4 by 2.9 cm, concerning for mass,. DEC [id number]- Hypermetabolic cecal mass suspicious for a villous adenoma or colon cancer causing obstruction of the appendiceal orifice.Patient not too keen on any surgical interventions at this time.   stable.   # Poorly controlled diabetes-blood sugars 113- 280;  Continue glipizide; and  Metformin. stable.   # IV mediport: functioning/Stable.  # Insomnia-continue Lunesta refill.  *appt thru mychart   # DISPOSITION:  # Keytruda today;  IVFs over 1 hour.ADD- KCL 20 IV -today.   # weekly venofer x3- either Tuesday or thrusday  # in 1 week- IVFs-1 lit /1 hourTuesdays/Thursday   # in 2 weeks-  IVFs-1 lit /1 hourTuesdays/Thursday   # # in 3 weeks-  IVFs-1 lit /1 hourTuesdays/Thursday  # follow up in 3week/ on April 2nd-Tuesday- MD; labs- cbc/cmp; TSH; [name];  IVFs-1 lit /1 hour;KCL 20 IV - Dr.B   All questions were answered. The patient knows to call the clinic with any problems, questions or concerns.    [name], MD 12/09/2022 10:37 AM

## 2022-12-09 NOTE — Assessment & Plan Note (Addendum)
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   May 05, 2022-s/p bronchoscopy- with Dr.Aleskerov-significant purulent/necrotic debris noted. Dec 23rd, 2023- PET scan: The cavitary process in the right upper lobe is similar in size to previous CTS, although demonstrates prominent peripheral hypermetabolic activity. 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 Hypermetabolic activity within the adjacent upper right lateral chest wall with right 3rd through 5th rib deformities, suspicious for direct tumor spread. No evidence of distant osseous metastatic disease; Hypermetabolic left hilar lymph node worrisome for metastatic disease. No other hypermetabolic lymph nodes or distant metastases identified.  Bronchoscopy/EBUS [facility] 26th, 2024- Dr.A]- BAL/ 4R-lymph node FNA negative for malignancy.  Patient declined further invasive procedures.  # Proceed with Keytruda # 2 today. Labs today reviewed;  acceptable for treatment today. Labs today reviewed;  acceptable for treatment today. Will plan to re-image after 2-3 cycles of  [place].   # Anemia- mild to moderate- feb 2024- iron sat- 17%; ferritin- 61- recommend venofer weekly x3-    #Right chest wall pain-/pleuritic-rib destruction malignancy versus radiation- ADDED back [march 2024; [name]]-fenatny patch 25 mcg.  Oxycodone 5 mg every 8 to 12 hours hours as needed overall-  stable..   # COPD/fatigue- [coughing/phlegm/ no fevers]- on albuterol/advair [smoking]- TSH- WNL;Continue  Xopenex/ipratropium nebs-; Mucinex- DM BID. stable.   # Hypokalemia-3.2- continue potassium 20 mEq liquid.-Hypocalemia: 8.4/albumin- 3.2. 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 Bary Castilla.  CT OCt 5th, 2023-Filling defect in the cecum near the appendiceal orifice measuring about 3.4 by 2.9 cm, concerning for mass,. DEC 99991111- Hypermetabolic cecal mass suspicious for a villous adenoma or colon cancer causing obstruction of the appendiceal orifice.Patient not too keen on any surgical interventions at this time.  stable.   # Poorly controlled diabetes-blood sugars 113- 280;  Continue glipizide; and  Metformin. stable.   # IV mediport: functioning/Stable.  # Insomnia-continue Lunesta refill.  *appt thru mychart   # DISPOSITION:  # Keytruda today;  IVFs over 1 hour.ADD- KCL 20 IV -today.   # weekly venofer x3- either Tuesday or thrusday  # in 1 week- IVFs-1 lit /1 hourTuesdays/Thursday   # in 2 weeks-  IVFs-1 lit /1 hourTuesdays/Thursday   # # in 3 weeks-  IVFs-1 lit /1 hourTuesdays/Thursday  # follow up in 3week/ on April 2nd-Tuesday- MD; labs- cbc/cmp; TSH; [name];  IVFs-1 lit /1 hour;KCL 20 IV - Dr.B

## 2022-12-09 NOTE — Progress Notes (Signed)
Nutrition Follow-up:  Patient with lung cancer, noted recurrence.  Restarted [place] and receiving 2nd dose today.    Met with patient during infusion.  Reports that appetite is less after first dose and taste is altered but not as bad as before.  "Today's treatment is going to be the deciding factor. "  Patient does not really like the taste of shakes.     Medications: reviewed, marinol discontinued  Labs: reviewed  Anthropometrics:   Weight 166 lb today 170 lb on 2/8 178 lb 12.8 oz on 5/16 180 lb on 4/18 174 lb on 12/31/21   NUTRITION DIAGNOSIS: Inadequate oral intake continues    INTERVENTION:  Encouraged patient to eat high calorie, high protein foods.   Can consider restarting marinol, if appetite continues to be diminished.   Patient has contact information    MONITORING, EVALUATION, GOAL: weight trends, intake   NEXT VISIT: Thursday, March 28 during infusion  Rodney Vega, [place], [place] Registered Dietitian [phone]

## 2022-12-14 ENCOUNTER — Inpatient Hospital Stay: Payer: Medicare PPO

## 2022-12-14 VITALS — BP 123/71 | HR 80 | Temp 97.0°F | Resp 20 | Wt 168.0 lb

## 2022-12-14 DIAGNOSIS — C3411 Malignant neoplasm of upper lobe, right bronchus or lung: Secondary | ICD-10-CM | POA: Diagnosis not present

## 2022-12-14 MED ORDER — HEPARIN SOD (PORK) LOCK FLUSH 100 UNIT/ML IV SOLN
500.0000 [IU] | Freq: Once | INTRAVENOUS | Status: AC | PRN
  Administered 2022-12-14: 500 [IU]
  Filled 2022-12-14: qty 5

## 2022-12-14 MED ORDER — SODIUM CHLORIDE 0.9% FLUSH
10.0000 mL | Freq: Once | INTRAVENOUS | Status: AC | PRN
  Administered 2022-12-14: 10 mL
  Filled 2022-12-14: qty 10

## 2022-12-14 MED ORDER — SODIUM CHLORIDE 0.9 % IV SOLN
Freq: Once | INTRAVENOUS | Status: AC
  Filled 2022-12-14: qty 250

## 2022-12-16 ENCOUNTER — Inpatient Hospital Stay: Payer: Medicare PPO

## 2022-12-16 ENCOUNTER — Ambulatory Visit: Payer: Medicare PPO

## 2022-12-16 VITALS — BP 135/65 | HR 95 | Temp 97.4°F | Resp 18

## 2022-12-16 DIAGNOSIS — C3411 Malignant neoplasm of upper lobe, right bronchus or lung: Secondary | ICD-10-CM

## 2022-12-16 MED ORDER — SODIUM CHLORIDE 0.9 % IV SOLN
200.0000 mg | Freq: Once | INTRAVENOUS | Status: AC
  Administered 2022-12-16: 200 mg via INTRAVENOUS
  Filled 2022-12-16: qty 10

## 2022-12-16 MED ORDER — HEPARIN SOD (PORK) LOCK FLUSH 100 UNIT/ML IV SOLN
500.0000 [IU] | Freq: Once | INTRAVENOUS | Status: AC | PRN
  Filled 2022-12-16: qty 5

## 2022-12-16 MED ORDER — SODIUM CHLORIDE 0.9 % IV SOLN
Freq: Once | INTRAVENOUS | Status: AC
  Filled 2022-12-16: qty 250

## 2022-12-16 MED ORDER — HEPARIN SOD (PORK) LOCK FLUSH 100 UNIT/ML IV SOLN
INTRAVENOUS | Status: AC
  Administered 2022-12-16: 500 [IU]
  Filled 2022-12-16: qty 5

## 2022-12-21 ENCOUNTER — Inpatient Hospital Stay: Payer: Medicare PPO

## 2022-12-21 ENCOUNTER — Other Ambulatory Visit: Payer: Self-pay | Admitting: *Deleted

## 2022-12-21 VITALS — BP 143/83 | HR 76 | Temp 95.7°F | Resp 20

## 2022-12-21 DIAGNOSIS — C3411 Malignant neoplasm of upper lobe, right bronchus or lung: Secondary | ICD-10-CM

## 2022-12-21 MED ORDER — ESZOPICLONE 1 MG PO TABS: ORAL_TABLET | ORAL | 1 refills | Status: DC

## 2022-12-21 MED ORDER — HEPARIN SOD (PORK) LOCK FLUSH 100 UNIT/ML IV SOLN
500.0000 [IU] | Freq: Once | INTRAVENOUS | Status: AC | PRN
  Administered 2022-12-21: 500 [IU]
  Filled 2022-12-21: qty 5

## 2022-12-21 MED ORDER — OXYCODONE HCL 5 MG PO TABS: 5.0000 mg | ORAL_TABLET | Freq: Three times a day (TID) | ORAL | 0 refills | Status: DC | PRN

## 2022-12-21 MED ORDER — SODIUM CHLORIDE 0.9 % IV SOLN
Freq: Once | INTRAVENOUS | Status: AC
  Filled 2022-12-21: qty 250

## 2022-12-21 MED ORDER — SODIUM CHLORIDE 0.9% FLUSH
10.0000 mL | Freq: Once | INTRAVENOUS | Status: AC | PRN
  Administered 2022-12-21: 10 mL
  Filled 2022-12-21: qty 10

## 2022-12-22 MED FILL — Iron Sucrose Inj 20 MG/ML (Fe Equiv): INTRAVENOUS | Qty: 10 | Status: AC

## 2022-12-23 ENCOUNTER — Inpatient Hospital Stay: Payer: Medicare PPO

## 2022-12-23 VITALS — BP 133/69 | HR 69 | Temp 98.0°F | Resp 18 | Wt 165.6 lb

## 2022-12-23 DIAGNOSIS — C3411 Malignant neoplasm of upper lobe, right bronchus or lung: Secondary | ICD-10-CM | POA: Diagnosis not present

## 2022-12-23 MED ORDER — SODIUM CHLORIDE 0.9 % IV SOLN
Freq: Once | INTRAVENOUS | Status: AC
  Filled 2022-12-23: qty 250

## 2022-12-23 MED ORDER — SODIUM CHLORIDE 0.9 % IV SOLN
200.0000 mg | Freq: Once | INTRAVENOUS | Status: AC
  Administered 2022-12-23: 200 mg via INTRAVENOUS
  Filled 2022-12-23: qty 10

## 2022-12-23 MED ORDER — HEPARIN SOD (PORK) LOCK FLUSH 100 UNIT/ML IV SOLN
500.0000 [IU] | Freq: Once | INTRAVENOUS | Status: AC | PRN
  Filled 2022-12-23: qty 5

## 2022-12-23 MED ORDER — HEPARIN SOD (PORK) LOCK FLUSH 100 UNIT/ML IV SOLN
INTRAVENOUS | Status: AC
  Administered 2022-12-23: 500 [IU]
  Filled 2022-12-23: qty 5

## 2022-12-28 ENCOUNTER — Inpatient Hospital Stay: Payer: Medicare PPO

## 2022-12-28 VITALS — BP 129/81 | HR 92 | Temp 96.6°F | Resp 20 | Wt 166.2 lb

## 2022-12-28 DIAGNOSIS — C3411 Malignant neoplasm of upper lobe, right bronchus or lung: Secondary | ICD-10-CM | POA: Diagnosis not present

## 2022-12-28 MED ORDER — HEPARIN SOD (PORK) LOCK FLUSH 100 UNIT/ML IV SOLN
500.0000 [IU] | Freq: Once | INTRAVENOUS | Status: AC | PRN
  Administered 2022-12-28: 500 [IU]
  Filled 2022-12-28: qty 5

## 2022-12-28 MED ORDER — SODIUM CHLORIDE 0.9 % IV SOLN
Freq: Once | INTRAVENOUS | Status: AC
  Filled 2022-12-28: qty 250

## 2022-12-28 MED ORDER — SODIUM CHLORIDE 0.9% FLUSH
10.0000 mL | Freq: Once | INTRAVENOUS | Status: AC | PRN
  Administered 2022-12-28: 10 mL
  Filled 2022-12-28: qty 10

## 2022-12-29 MED FILL — Iron Sucrose Inj 20 MG/ML (Fe Equiv): INTRAVENOUS | Qty: 10 | Status: AC

## 2022-12-30 ENCOUNTER — Inpatient Hospital Stay: Payer: Medicare PPO

## 2022-12-30 VITALS — BP 143/79 | HR 91 | Temp 96.5°F | Resp 18

## 2022-12-30 DIAGNOSIS — C3411 Malignant neoplasm of upper lobe, right bronchus or lung: Secondary | ICD-10-CM | POA: Diagnosis not present

## 2022-12-30 MED ORDER — SODIUM CHLORIDE 0.9 % IV SOLN
Freq: Once | INTRAVENOUS | Status: AC
  Filled 2022-12-30: qty 250

## 2022-12-30 MED ORDER — SODIUM CHLORIDE 0.9 % IV SOLN
200.0000 mg | Freq: Once | INTRAVENOUS | Status: AC
  Administered 2022-12-30: 200 mg via INTRAVENOUS
  Filled 2022-12-30: qty 10

## 2022-12-30 NOTE — Patient Instructions (Signed)

## 2022-12-30 NOTE — Progress Notes (Signed)
Nutrition Follow-up:  Patient with recurrent lung cancer.  Currently on keytruda.  Met with patient during infusion. Reports that his appetite is hanging in there.  Taste is still and issue for patient.  Going to have sweet and sour chicken tonight.    Medications: reviewed  Labs: reviewed  Anthropometrics:   Weight 166 lb 3 oz 166 lb on 3/7 170 lb on 2/8 178 lb on 5/16   NUTRITION DIAGNOSIS: Inadequate oral intake continues   INTERVENTION:  Encouraged high calorie, high protein foods Encouraged small frequent nibbles Encouraged hydration    MONITORING, EVALUATION, GOAL: weight trends, intake   NEXT VISIT: as needed  Rodney Vega, [place], [place] Registered Dietitian [phone]

## 2023-01-03 MED FILL — Dexamethasone Sodium Phosphate Inj 100 MG/10ML: INTRAMUSCULAR | Qty: 1 | Status: AC

## 2023-01-04 ENCOUNTER — Ambulatory Visit: Payer: Medicare PPO

## 2023-01-04 ENCOUNTER — Inpatient Hospital Stay: Payer: Medicare PPO

## 2023-01-04 ENCOUNTER — Encounter: Payer: Self-pay | Admitting: Internal Medicine

## 2023-01-04 ENCOUNTER — Ambulatory Visit: Payer: Medicare PPO | Admitting: Internal Medicine

## 2023-01-04 ENCOUNTER — Inpatient Hospital Stay: Payer: Medicare PPO | Attending: Internal Medicine

## 2023-01-04 ENCOUNTER — Inpatient Hospital Stay (HOSPITAL_BASED_OUTPATIENT_CLINIC_OR_DEPARTMENT_OTHER): Payer: Medicare PPO | Admitting: Internal Medicine

## 2023-01-04 ENCOUNTER — Other Ambulatory Visit: Payer: Medicare PPO

## 2023-01-04 VITALS — BP 119/77 | HR 97 | Temp 98.9°F | Wt 166.0 lb

## 2023-01-04 DIAGNOSIS — I10 Essential (primary) hypertension: Secondary | ICD-10-CM | POA: Insufficient documentation

## 2023-01-04 DIAGNOSIS — Z79899 Other long term (current) drug therapy: Secondary | ICD-10-CM | POA: Insufficient documentation

## 2023-01-04 DIAGNOSIS — C3411 Malignant neoplasm of upper lobe, right bronchus or lung: Secondary | ICD-10-CM

## 2023-01-04 DIAGNOSIS — Z7984 Long term (current) use of oral hypoglycemic drugs: Secondary | ICD-10-CM | POA: Diagnosis not present

## 2023-01-04 DIAGNOSIS — Z8614 Personal history of Methicillin resistant Staphylococcus aureus infection: Secondary | ICD-10-CM | POA: Insufficient documentation

## 2023-01-04 DIAGNOSIS — M79603 Pain in arm, unspecified: Secondary | ICD-10-CM | POA: Insufficient documentation

## 2023-01-04 DIAGNOSIS — I959 Hypotension, unspecified: Secondary | ICD-10-CM | POA: Diagnosis not present

## 2023-01-04 DIAGNOSIS — R21 Rash and other nonspecific skin eruption: Secondary | ICD-10-CM | POA: Diagnosis not present

## 2023-01-04 DIAGNOSIS — E1165 Type 2 diabetes mellitus with hyperglycemia: Secondary | ICD-10-CM | POA: Diagnosis not present

## 2023-01-04 DIAGNOSIS — Z9221 Personal history of antineoplastic chemotherapy: Secondary | ICD-10-CM | POA: Insufficient documentation

## 2023-01-04 DIAGNOSIS — Z5112 Encounter for antineoplastic immunotherapy: Secondary | ICD-10-CM | POA: Diagnosis not present

## 2023-01-04 DIAGNOSIS — E876 Hypokalemia: Secondary | ICD-10-CM | POA: Insufficient documentation

## 2023-01-04 DIAGNOSIS — Z923 Personal history of irradiation: Secondary | ICD-10-CM | POA: Diagnosis not present

## 2023-01-04 DIAGNOSIS — M25511 Pain in right shoulder: Secondary | ICD-10-CM | POA: Diagnosis not present

## 2023-01-04 DIAGNOSIS — J432 Centrilobular emphysema: Secondary | ICD-10-CM | POA: Insufficient documentation

## 2023-01-04 DIAGNOSIS — D649 Anemia, unspecified: Secondary | ICD-10-CM | POA: Diagnosis not present

## 2023-01-04 DIAGNOSIS — J449 Chronic obstructive pulmonary disease, unspecified: Secondary | ICD-10-CM | POA: Insufficient documentation

## 2023-01-04 DIAGNOSIS — E119 Type 2 diabetes mellitus without complications: Secondary | ICD-10-CM | POA: Diagnosis not present

## 2023-01-04 DIAGNOSIS — G4700 Insomnia, unspecified: Secondary | ICD-10-CM | POA: Diagnosis not present

## 2023-01-04 DIAGNOSIS — Z87442 Personal history of urinary calculi: Secondary | ICD-10-CM | POA: Diagnosis not present

## 2023-01-04 DIAGNOSIS — R590 Localized enlarged lymph nodes: Secondary | ICD-10-CM | POA: Insufficient documentation

## 2023-01-04 DIAGNOSIS — F17210 Nicotine dependence, cigarettes, uncomplicated: Secondary | ICD-10-CM | POA: Diagnosis not present

## 2023-01-04 LAB — COMPREHENSIVE METABOLIC PANEL
ALT: 7 U/L (ref 0–44)
AST: 19 U/L (ref 15–41)
Albumin: 3.3 g/dL — ABNORMAL LOW (ref 3.5–5.0)
Alkaline Phosphatase: 97 U/L (ref 38–126)
Anion gap: 9 (ref 5–15)
BUN: 8 mg/dL (ref 8–23)
CO2: 25 mmol/L (ref 22–32)
Calcium: 8.5 mg/dL — ABNORMAL LOW (ref 8.9–10.3)
Chloride: 99 mmol/L (ref 98–111)
Creatinine, Ser: 0.96 mg/dL (ref 0.61–1.24)
GFR, Estimated: >60 mL/min (ref >60)
Glucose, Bld: 182 mg/dL — ABNORMAL HIGH (ref 70–99)
Potassium: 3.3 mmol/L — ABNORMAL LOW (ref 3.5–5.1)
Sodium: 133 mmol/L — ABNORMAL LOW (ref 135–145)
Total Bilirubin: 0.3 mg/dL (ref 0.3–1.2)
Total Protein: 6.8 g/dL (ref 6.5–8.1)

## 2023-01-04 LAB — CBC WITH DIFFERENTIAL/PLATELET
Abs Immature Granulocytes: 0.02 10*3/uL (ref 0.00–0.07)
Basophils Absolute: 0 10*3/uL (ref 0.0–0.1)
Basophils Relative: 0 %
Eosinophils Absolute: 0 10*3/uL (ref 0.0–0.5)
Eosinophils Relative: 0 %
HCT: 33.3 % — ABNORMAL LOW (ref 39.0–52.0)
Hemoglobin: 10.8 g/dL — ABNORMAL LOW (ref 13.0–17.0)
Immature Granulocytes: 0 %
Lymphocytes Relative: 16 %
Lymphs Abs: 0.9 10*3/uL (ref 0.7–4.0)
MCH: 29 pg (ref 26.0–34.0)
MCHC: 32.4 g/dL (ref 30.0–36.0)
MCV: 89.3 fL (ref 80.0–100.0)
Monocytes Absolute: 0.5 10*3/uL (ref 0.1–1.0)
Monocytes Relative: 8 %
Neutro Abs: 4.3 10*3/uL (ref 1.7–7.7)
Neutrophils Relative %: 76 %
Platelets: 219 10*3/uL (ref 150–400)
RBC: 3.73 MIL/uL — ABNORMAL LOW (ref 4.22–5.81)
RDW: 14.3 % (ref 11.5–15.5)
WBC: 5.8 10*3/uL (ref 4.0–10.5)
nRBC: 0 % (ref 0.0–0.2)

## 2023-01-04 LAB — TSH: TSH: 3.038 u[IU]/mL (ref 0.350–4.500)

## 2023-01-04 MED ORDER — SODIUM CHLORIDE 0.9 % IV SOLN
Freq: Once | INTRAVENOUS | Status: AC
  Filled 2023-01-04: qty 250

## 2023-01-04 MED ORDER — SODIUM CHLORIDE 0.9% FLUSH
10.0000 mL | INTRAVENOUS | Status: DC | PRN
  Filled 2023-01-04: qty 10

## 2023-01-04 MED ORDER — HEPARIN SOD (PORK) LOCK FLUSH 100 UNIT/ML IV SOLN
500.0000 [IU] | Freq: Once | INTRAVENOUS | Status: DC | PRN
  Filled 2023-01-04: qty 5

## 2023-01-04 MED ORDER — POTASSIUM CHLORIDE 20 MEQ/100ML IV SOLN
20.0000 meq | Freq: Once | INTRAVENOUS | Status: AC
  Administered 2023-01-04: 20 meq via INTRAVENOUS

## 2023-01-04 MED ORDER — TRIAMCINOLONE ACETONIDE 0.5 % EX OINT: 1.0000 | TOPICAL_OINTMENT | Freq: Two times a day (BID) | CUTANEOUS | 1 refills | Status: DC

## 2023-01-04 MED ORDER — SODIUM CHLORIDE 0.9 % IV SOLN
200.0000 mg | Freq: Once | INTRAVENOUS | Status: AC
  Administered 2023-01-04: 200 mg via INTRAVENOUS
  Filled 2023-01-04: qty 8

## 2023-01-04 MED ORDER — SODIUM CHLORIDE 0.9% FLUSH
10.0000 mL | Freq: Once | INTRAVENOUS | Status: AC
  Administered 2023-01-04: 10 mL via INTRAVENOUS
  Filled 2023-01-04: qty 10

## 2023-01-04 NOTE — Progress Notes (Signed)
Pt in for follow up, reports having more coughing and sinus drainage. Pt continues to have pain in right shoulder and side under arm.

## 2023-01-04 NOTE — Patient Instructions (Signed)
[facility]  Discharge Instructions: Thank you for choosing [facility] to provide your oncology and hematology care.  If you have a lab appointment with the Cancer Center, please go directly to the Cancer Center and check in at the registration area.  Wear comfortable clothing and clothing appropriate for easy access to any Portacath or PICC line.   We strive to give you quality time with your provider. You may need to reschedule your appointment if you arrive late (15 or more minutes).  Arriving late affects you and other patients whose appointments are after yours.  Also, if you miss three or more appointments without notifying the office, you may be dismissed from the clinic at the provider's discretion.      For prescription refill requests, have your pharmacy contact our office and allow 72 hours for refills to be completed.    Today you received the following chemotherapy and/or immunotherapy agents- keytruda      To help prevent nausea and vomiting after your treatment, we encourage you to take your nausea medication as directed.  BELOW ARE SYMPTOMS THAT SHOULD BE REPORTED IMMEDIATELY: *FEVER GREATER THAN 100.4 F (38 C) OR HIGHER *CHILLS OR SWEATING *NAUSEA AND VOMITING THAT IS NOT CONTROLLED WITH YOUR NAUSEA MEDICATION *UNUSUAL SHORTNESS OF BREATH *UNUSUAL BRUISING OR BLEEDING *URINARY PROBLEMS (pain or burning when urinating, or frequent urination) *BOWEL PROBLEMS (unusual diarrhea, constipation, pain near the anus) TENDERNESS IN MOUTH AND THROAT WITH OR WITHOUT PRESENCE OF ULCERS (sore throat, sores in mouth, or a toothache) UNUSUAL RASH, SWELLING OR PAIN  UNUSUAL VAGINAL DISCHARGE OR ITCHING   Items with * indicate a potential emergency and should be followed up as soon as possible or go to the Emergency Department if any problems should occur.  Please show the CHEMOTHERAPY ALERT CARD or IMMUNOTHERAPY ALERT CARD at check-in to  the Emergency Department and triage nurse.  Should you have questions after your visit or need to cancel or reschedule your appointment, please contact [facility]  [phone] and follow the prompts.  Office hours are 8:00 a.m. to 4:30 p.m. Monday - Friday. Please note that voicemails left after 4:00 p.m. may not be returned until the following business day.  We are closed weekends and major holidays. You have access to a nurse at all times for urgent questions. Please call the main number to the clinic [phone] and follow the prompts.  For any non-urgent questions, you may also contact your provider using MyChart. We now offer e-Visits for anyone 18 and older to request care online for non-urgent symptoms. For details visit [email or web address].   Also download the MyChart app! Go to the app store, search "MyChart", open the app, select , and log in with your MyChart username and password.    

## 2023-01-04 NOTE — Progress Notes (Signed)
[place] NOTE  Patient Care Team: [name], MD as PCP - General (Family Medicine) [name], RN as Oncology Nurse Navigator [name], [name], MD as Consulting Physician (Urology) [name], MD as Consulting Physician (Oncology) Ottie Glazier, MD as Consulting Physician (Pulmonary Disease)  CHIEF COMPLAINTS/PURPOSE OF CONSULTATION: lung cancer    Oncology History Overview Note  AUG 2022- 8.2 x 5.9 cm spiculated mass noted in right upper lobe consistent with malignancy. It extends from the right hilum to the lateral chest wall and results in lytic destruction of the lateral portion of the right fourth rib.  Mediastinal lymph nodes are noted, including 12 mm right hilar lymph node, consistent with metastatic disease. 3 cm right adrenal mass is noted concerning for metastatic disease.  # AUG 2022- PET IMPRESSION: Peripherally hypermetabolic right upper lobe mass, likely due to central necrosis. Mass extends into the right hilum and right lateral chest wall involving the second through fourth right ribs.   Mildly enlarged and hypermetabolic right hilar lymph node.   No evidence of metastatic disease in the abdomen or pelvis.  # AUG, 2022-RIGHT CHEST WALL Bx-necrosis; suspicious for malignancy not definitive [discussed with Dr.Kraynie;MDT] LUNG MASS, RIGHT; BIOPSY:  - SUSPICIOUS FOR MALIGNANCY.  - SEE COMMENT.   Comment:  Approximately six tissue cores demonstrate inflamed fibrous capsule with  hemosiderin-laden macrophages, in a background of abundant necrosis.  One of the cores demonstrates a minute fragment of viable epithelial  cells.  These cells are positive for p40.  They are negative for TTF1  and CK7. The cells of interest disappear on deeper cut levels.  While  the findings are suspicious for squamous cell carcinoma, there is not  enough viable tumor for a definitive diagnosis.  Additional tissue  sampling may be helpful.    # SEP 2022-cycle #2 Taxol hypersensitive reaction.  Switched over to #3 cycle- carbo-Abraxane starting 06/29/2021.  Discontinued IMFINZI therapy-December 2022 given poor tolerance/pneumonia admission to hospital.  #  [name] was in May-June 2023 x3 doses; stopped because of intolerance-fatigue patient preference/lack of progression.  # JAN 2024-bronchoscopy biopsy negative for malignancy; however PET scan concerning for recurrence/progression.  # FEB 15t, 2024- RE-Start Keytruda.   # DEC 2022- s/p ID- Dr.ravishankar- ? Lung abscess-no antibiotics-  JAN 2023- CT scan incidental- cecal mass- colonoscopy[Dr.Russo; [facility]- high grade dysplasia]  S/p evaluation with Dr. [name] surgery for now]   # S/p evaluation with Dr. [name].   Primary cancer of right upper lobe of lung  06/03/2021 Initial Diagnosis   Primary cancer of right upper lobe of lung ([place])   06/03/2021 Cancer Staging   Staging form: Lung, AJCC 8th Edition - Clinical: Stage IIIB (cT3, cN2, cM0) - Signed by [name], MD on 06/03/2021   06/15/2021 - 08/03/2021 Chemotherapy   Patient is on Treatment Plan : LUNG Carboplatin / Paclitaxel + XRT q7d     10/02/2021 - 10/19/2021 Chemotherapy   Patient is on Treatment Plan : LUNG Durvalumab q14d     02/03/2022 -  Chemotherapy   Patient is on Treatment Plan : LUNG NSCLC Pembrolizumab (200) q21d     02/04/2022 - 03/18/2022 Chemotherapy   Patient is on Treatment Plan : LUNG NSCLC Pembrolizumab (200) q21d       HISTORY OF PRESENTING ILLNESS: Patient is ambulating independently.  Accompanied by wife.  Rodney Vega 67 y.o.  male history of smoking -"lung cancer" [Limited necrotic tissue]-locally advanced unresectable; and Cecal mass-high grade dysplasia intact- currently  on single agent [name] is here for follow-up.  Pt in for follow up, reports having more coughing and sinus drainage. Pt continues to have pain in right shoulder and side under arm.   Patient noted to  have itchy back.  Mild rash.  Pt definitely feels weaker after [place]. Spending more time sleeping. Appetite is diminished.   He continues to get IV fluids-twice a week-feels improved after IV infusions.   Denies abdominal pain nausea vomiting or diarrhea.  No blood in stools black or stools.  Review of Systems  Constitutional:  Positive for malaise/fatigue and weight loss. Negative for chills, diaphoresis and fever.  HENT:  Negative for nosebleeds and sore throat.   Eyes:  Negative for double vision.  Respiratory:  Positive for cough, sputum production and shortness of breath. Negative for hemoptysis and wheezing.   Cardiovascular:  Positive for chest pain. Negative for palpitations, orthopnea and leg swelling.  Gastrointestinal:  Positive for nausea. Negative for abdominal pain, blood in stool, constipation, diarrhea, heartburn, melena and vomiting.  Genitourinary:  Negative for dysuria, frequency and urgency.  Musculoskeletal:  Negative for back pain and joint pain.  Skin: Negative.  Negative for itching and rash.  Neurological:  Positive for tingling. Negative for dizziness, focal weakness, weakness and headaches.  Endo/Heme/Allergies:  Does not bruise/bleed easily.  Psychiatric/Behavioral:  Negative for depression. The patient is not nervous/anxious and does not have insomnia.      MEDICAL HISTORY:  Past Medical History:  Diagnosis Date   Acute on chronic respiratory failure    Anemia    Cancer    Colostomy present    COPD (chronic obstructive pulmonary disease)    Degenerative arthritis of knee    Depression    Diabetes mellitus without complication    Diverticulitis    Dyspnea    Glaucoma    since 2004   Hernia 1979   History of kidney stones    History of methicillin resistant staphylococcus aureus (MRSA)    Hypertension    Hypokalemia    Marijuana use    Mass of cecum    Nephrolithiasis    Neuromuscular disorder    Personal history of tobacco use, presenting  hazards to health    Pneumonia    Port-A-Cath in place    Pre-diabetes    Primary cancer of right upper lobe of lung    SCC of lung (small cell carcinoma)    Screening for obesity    Severe sepsis    Special screening for malignant neoplasms, colon    Tobacco use    Wears dentures    full upper and lower    SURGICAL HISTORY: Past Surgical History:  Procedure Laterality Date   BACK SURGERY  1994, 2012   lumbar bulging disc   BRONCHOSCOPY  05/05/2022   CATARACT EXTRACTION W/PHACO Left 01/20/2021   Procedure: CATARACT EXTRACTION PHACO AND INTRAOCULAR LENS PLACEMENT ([place]) LEFT;  Surgeon: Birder Robson, MD;  Location: [place];  Service: Ophthalmology;  Laterality: Left;  10.13 0:52.1   COLONOSCOPY  [id number]   [facility]/Dr. Buelah Manis sessile polyp,82mm, found in rectum,multiple diverticula were found in the sigmoid, in descending and in transverse colon   COLONOSCOPY WITH PROPOFOL N/A 10/22/2021   Procedure: COLONOSCOPY WITH PROPOFOL;  Surgeon: Annamaria Helling, DO;  Location: [facility] ENDOSCOPY;  Service: Gastroenterology;  Laterality: N/A;  DM   COLOSTOMY  04/20/2013   EYE SURGERY     HERNIA REPAIR  1979   inguinal   IR IMAGING GUIDED  PORT INSERTION  06/10/2021   JOINT REPLACEMENT Right    knee   KNEE ARTHROPLASTY Right 05/08/2018   Procedure: COMPUTER ASSISTED TOTAL KNEE ARTHROPLASTY;  Surgeon: Dereck Leep, MD;  Location: [facility] ORS;  Service: Orthopedics;  Laterality: Right;   KNEE ARTHROPLASTY Left 11/16/2019   Procedure: COMPUTER ASSISTED TOTAL KNEE ARTHROPLASTY;  Surgeon: Dereck Leep, MD;  Location: [facility] ORS;  Service: Orthopedics;  Laterality: Left;   LAPAROTOMY  04/20/2013   colon resection with colosotmy   LITHOTRIPSY  2013   LUNG BIOPSY  2023   RIGID BRONCHOSCOPY N/A 10/29/2022   Procedure: RIGID BRONCHOSCOPY;  Surgeon: Ottie Glazier, MD;  Location: [facility] ORS;  Service: Thoracic;  Laterality: N/A;   [place] WITH ENDOBRONCHIAL NAVIGATION N/A 05/05/2022   Procedure: VIDEO BRONCHOSCOPY WITH ENDOBRONCHIAL NAVIGATION;  Surgeon: Ottie Glazier, MD;  Location: [facility];  Service: Thoracic;  Laterality: N/A;   VIDEO BRONCHOSCOPY WITH ENDOBRONCHIAL ULTRASOUND N/A 05/05/2022   Procedure: VIDEO BRONCHOSCOPY WITH ENDOBRONCHIAL ULTRASOUND;  Surgeon: Ottie Glazier, MD;  Location: [facility];  Service: Thoracic;  Laterality: N/A;   VIDEO BRONCHOSCOPY WITH ENDOBRONCHIAL ULTRASOUND N/A 10/29/2022   Procedure: VIDEO BRONCHOSCOPY WITH ENDOBRONCHIAL ULTRASOUND;  Surgeon: Ottie Glazier, MD;  Location: [facility] ORS;  Service: Thoracic;  Laterality: N/A;    SOCIAL HISTORY: Social History   Socioeconomic History   Marital status: Married    Spouse name: Pegge   Number of children: Not on file   Years of education: Not on file   Highest education level: Not on file  Occupational History   Not on file  Tobacco Use   Smoking status: Every Day    Packs/day: 1.00    Years: 30.00    Additional pack years: 0.00    Total pack years: 30.00    Types: Cigarettes   Smokeless tobacco: Never   Tobacco comments:    since age 63 or 89  Vaping Use   Vaping Use: Never used  Substance and Sexual Activity   Alcohol use: Not Currently    Alcohol/week: 2.0 standard drinks of alcohol    Types: 2 Standard drinks or equivalent per week    Comment: 1-2 drinks per week   Drug use: Yes    Types: Marijuana    Comment: 2-3 week   Sexual activity: Not on file  Other Topics Concern   Not on file  Social History Narrative   15 mins [place]; smoking; not much alcohol. Worked for [employer], 2019. Marland Kitchen    Social Determinants of Health   Financial Resource Strain: Low Risk  (08/10/2022)   Overall Financial Resource Strain (CARDIA)    Difficulty of Paying Living Expenses: Not very hard  Food Insecurity: No Food Insecurity (08/10/2022)   Hunger Vital Sign    Worried About Running Out of Food in the Last  Year: Never true    Ran Out of Food in the Last Year: Never true  Transportation Needs: No Transportation Needs (10/12/2022)   PRAPARE - Hydrologist (Medical): No    Lack of Transportation (Non-Medical): No  Physical Activity: Not on file  Stress: No Stress Concern Present (08/10/2022)   [place]    Feeling of Stress : Not at all  Social Connections: Unknown (08/10/2022)   Social Connection and Isolation Panel [NHANES]    Frequency of Communication with Friends and Family: Three times a week  Frequency of Social Gatherings with Friends and Family: Twice a week    Attends Religious Services: Patient declined    Marine scientist or Organizations: No    Attends Archivist Meetings: Never    Marital Status: Married  Human resources officer Violence: Not At Risk (08/10/2022)   Humiliation, Afraid, Rape, and Kick questionnaire    Fear of Current or Ex-Partner: No    Emotionally Abused: No    Physically Abused: No    Sexually Abused: No    FAMILY HISTORY: Family History  Problem Relation Age of Onset   Diabetes Mother    Heart disease Mother    Heart attack Father    Prostate cancer Father     ALLERGIES:  is allergic to paclitaxel, ambien [zolpidem], nsaids, and aleve [naproxen sodium].  MEDICATIONS:  Current Outpatient Medications  Medication Sig Dispense Refill   Accu-Chek Softclix Lancets lancets USE AS DIRECTED FOUR TIMES DAILY 100 each 12   acetaminophen (TYLENOL) 500 MG tablet Take 2 tablets (1,000 mg total) by mouth daily as needed. Home med. 30 tablet 0   albuterol (PROVENTIL) (2.5 MG/3ML) 0.083% nebulizer solution Take 2.5 mg by nebulization every 4 (four) hours as needed for wheezing or shortness of breath.     albuterol (VENTOLIN HFA) 108 (90 Base) MCG/ACT inhaler SMARTSIG:2 inhalation Via Inhaler Every 6 Hours PRN (Patient taking differently: 1-2 puffs every 6 (six)  hours as needed. SMARTSIG:2 inhalation Via Inhaler Every 6 Hours PRN) 1 each 2   blood glucose meter kit and supplies KIT Check your blood glucose levels twice a day; once in the morning before breakfast; and once in the evening after dinner. 1 each 0   BREZTRI AEROSPHERE 160-9-4.8 MCG/ACT AERO Inhale 2 puffs into the lungs 2 (two) times daily.     clobetasol cream (TEMOVATE) [id number] % Apply 1 Application topically 2 (two) times daily. 30 g 2   dextromethorphan-guaiFENesin (MUCINEX DM) 30-600 MG 12hr tablet Take 1 tablet by mouth 2 (two) times daily as needed for cough.     dorzolamide-timolol (COSOPT) 22.3-6.8 MG/ML ophthalmic solution Place 1 drop into both eyes 2 (two) times daily.     eszopiclone (LUNESTA) 1 MG TABS tablet TAKE 1 TABLET(1 MG) BY MOUTH AT BEDTIME AS NEEDED FOR SLEEP 30 tablet 1   fentaNYL (DURAGESIC) 25 MCG/HR Place 1 patch onto the skin every 3 (three) days. 10 patch 0   glipiZIDE (GLUCOTROL) 5 MG tablet TAKE 1 TABLET(5 MG) BY MOUTH DAILY BEFORE BREAKFAST 90 tablet 2   glucose blood test strip Check your blood glucose levels twice a day; once in the morning before breakfast; and once in the evening after dinner. 100 each 12   ipratropium-albuterol (DUONEB) 0.5-2.5 (3) MG/3ML SOLN Take 3 mLs by nebulization every 6 (six) hours as needed. 360 mL 1   latanoprost (XALATAN) 0.005 % ophthalmic solution Place 1 drop into both eyes at bedtime.     lidocaine-prilocaine (EMLA) cream Apply 1 Application topically as needed. 30 g 3   metFORMIN (GLUCOPHAGE) 500 MG tablet Take 1 tablet (500 mg total) by mouth 2 (two) times daily with a meal. 60 tablet 2   montelukast (SINGULAIR) 10 MG tablet TAKE 1 TABLET(10 MG) BY MOUTH AT BEDTIME (Patient taking differently: Take 10 mg by mouth at bedtime. TAKE 1 TABLET(10 MG) BY MOUTH AT BEDTIME) 90 tablet 0   Multiple Vitamin (MULTIVITAMIN WITH MINERALS) TABS tablet Take 1 tablet by mouth daily.     olmesartan (BENICAR) 40 MG  tablet Take 1 tablet (40 mg  total) by mouth every morning. 30 tablet 3   oxyCODONE (OXY IR/ROXICODONE) 5 MG immediate release tablet Take 1 tablet (5 mg total) by mouth every 8 (eight) hours as needed for severe pain. 90 tablet 0   pregabalin (LYRICA) 50 MG capsule Take 1 capsule (50 mg total) by mouth 2 (two) times daily. 60 capsule 2   prochlorperazine (COMPAZINE) 10 MG tablet Take 10 mg by mouth every 6 (six) hours as needed for nausea or vomiting.     propranolol ER (INDERAL LA) 60 MG 24 hr capsule TAKE 1 CAPSULE(60 MG) BY MOUTH DAILY (Patient taking differently: 60 mg every morning.) 30 capsule 3   Respiratory Therapy Supplies (NEBULIZER) DEVI Use as directed 1 each 0   tamsulosin (FLOMAX) 0.4 MG CAPS capsule Take 0.4 mg by mouth daily after supper.     triamcinolone ointment (KENALOG) 0.5 % Apply 1 Application topically 2 (two) times daily. 30 g 1   venlafaxine XR (EFFEXOR-XR) 75 MG 24 hr capsule Take 225 mg by mouth daily with breakfast.     vitamin B-12 (CYANOCOBALAMIN) 500 MCG tablet Take 1 tablet by mouth daily.     chlorpheniramine-HYDROcodone (TUSSIONEX) 10-8 MG/5ML Take 5 mLs by mouth at bedtime as needed for cough. (Patient not taking: Reported on 12/09/2022) 140 mL 0   guaiFENesin-codeine 100-10 MG/5ML syrup Take 5 mLs by mouth daily as needed for cough. (Patient not taking: Reported on 12/09/2022) 120 mL 0   potassium chloride 20 MEQ/15ML (10%) SOLN Take 15 mLs (20 mEq total) by mouth 2 (two) times daily. (Patient not taking: Reported on 12/09/2022) 473 mL 2   Vitamin D, Ergocalciferol, (DRISDOL) 1.25 MG (50000 UNIT) CAPS capsule TAKE 1 CAPSULE BY MOUTH 1 TIME A WEEK (Patient not taking: Reported on 01/04/2023) 12 capsule 1   No current facility-administered medications for this visit.   Facility-Administered Medications Ordered in Other Visits  Medication Dose Route Frequency Provider Last Rate Last Admin   0.9 %  sodium chloride infusion   Intravenous Once Charlaine Dalton R, MD       heparin lock flush 100  UNIT/ML injection            heparin lock flush 100 UNIT/ML injection            heparin lock flush 100 UNIT/ML injection            heparin lock flush 100 unit/mL  500 Units Intracatheter Once PRN [name], MD       pembrolizumab [facility]) 200 mg in sodium chloride 0.9 % 50 mL chemo infusion  200 mg Intravenous Once Charlaine Dalton R, MD       potassium chloride 20 mEq in 100 mL IVPB  20 mEq Intravenous Once Charlaine Dalton R, MD       sodium chloride flush (NS) 0.9 % injection 10 mL  10 mL Intracatheter PRN [name], MD          .  PHYSICAL EXAMINATION: ECOG PERFORMANCE STATUS: 1 - Symptomatic but completely ambulatory  Vitals:   01/04/23 1110  BP: 119/77  Pulse: 97  Temp: 98.9 F (37.2 C)  SpO2: 97%          Filed Weights   01/04/23 1110  Weight: 166 lb (75.3 kg)   Mild skin rash in the back.   Physical Exam Vitals and nursing note reviewed.  HENT:     Head: Normocephalic and atraumatic.  Mouth/Throat:     Pharynx: Oropharynx is clear.  Eyes:     Extraocular Movements: Extraocular movements intact.     Pupils: Pupils are equal, round, and reactive to light.  Cardiovascular:     Rate and Rhythm: Normal rate and regular rhythm.  Pulmonary:     Comments: Decreased breath sounds bilaterally.  Abdominal:     Palpations: Abdomen is soft.  Musculoskeletal:        General: Normal range of motion.     Cervical back: Normal range of motion.  Skin:    General: Skin is warm.  Neurological:     General: No focal deficit present.     Mental Status: He is alert and oriented to person, place, and time.  Psychiatric:        Behavior: Behavior normal.        Judgment: Judgment normal.      LABORATORY DATA:  I have reviewed the data as listed Lab Results  Component Value Date   WBC 5.8 01/04/2023   HGB 10.8 (L) 01/04/2023   HCT 33.3 (L) 01/04/2023   MCV 89.3 01/04/2023   PLT 219 01/04/2023   Recent Labs     11/30/22 0935 12/09/22 0903 01/04/23 1032  NA 135 135 133*  K 3.2* 3.2* 3.3*  CL 102 100 99  CO2 25 24 25   GLUCOSE 163* 216* 182*  BUN 7* 7* 8  CREATININE 0.99 0.90 0.96  CALCIUM 8.6* 8.4* 8.5*  GFRNONAA >60 >60 >60  PROT 7.0 7.0 6.8  ALBUMIN 3.3* 3.3* 3.3*  AST 16 19 19   ALT 7 7 7   ALKPHOS 98 88 97  BILITOT 0.5 0.3 0.3    RADIOGRAPHIC STUDIES: I have personally reviewed the radiological images as listed and agreed with the findings in the report. DG Chest 2 View  Result Date: 12/07/2022 CLINICAL DATA:  Shortness of breath, lung carcinoma EXAM: CHEST - 2 VIEW COMPARISON:  Previous studies including the examination of 10/29/2022 FINDINGS: Cardiac size is within normal limits. Thoracic aorta is tortuous. Right lung is smaller than left. There is a cavitary lesion in the right apical region with no significant change. There is interval decrease in interstitial markings in the lower lung fields. Costophrenic angles are clear. There is no pneumothorax. Tip of chest port is seen in superior vena cava. IMPRESSION: No significant changes are noted in cavitary lesion in the right upper lung field. There are no signs of pulmonary edema or new focal infiltrates. There is no pleural effusion or pneumothorax. Electronically Signed   By: Elmer Picker M.D.   On: 12/07/2022 11:30    ASSESSMENT & PLAN:   Primary cancer of right upper lobe of lung ([place]) # UNRESECTABLE/LOCALLY ADVANCED- Right upper lobe lung mass -concerning for malignancy [biopsy inconclusive/atypical cells]-  April 2023- PD-L1 positive [cirucologene].   May 05, 2022-s/p bronchoscopy- with Dr.Aleskerov-significant purulent/necrotic debris noted. Dec 23rd, 2023- PET scan: The cavitary process in the right upper lobe is similar in size to previous CTS, although demonstrates prominent peripheral hypermetabolic activity. This could reflect inflammation/infection within a chronic pleural cavity associated with a bronchopleural  fistula, although is suspicious for local recurrence of tumor. Hypermetabolic activity within the adjacent upper right lateral chest wall with right 3rd through 5th rib deformities, suspicious for direct tumor spread. No evidence of distant osseous metastatic disease; Hypermetabolic left hilar lymph node worrisome for metastatic disease. No other hypermetabolic lymph nodes or distant metastases identified.  Bronchoscopy/EBUS [facility] 26th, 2024- Dr.A]- BAL/ 4R-lymph node  FNA negative for malignancy.  Patient declined further invasive procedures.  # Proceed with Keytruda # 3 today... Labs today reviewed;  acceptable for treatment today. Labs today reviewed;  acceptable for treatment today. Will plan to re-image after 3 cycles of  [place].  CT scan chest abdomen pelvis ordered.  # rash/itch of skin- G-12 sec to [place]; recommend kenalog. New script sent.   # Anemia- mild to moderate- feb 2024- iron sat- 17%; ferritin- 61- recommend venofer weekly x3-    #Right chest wall pain-/pleuritic-rib destruction malignancy versus radiation- ADDED back [march 2024; [name]]-fenatny patch 25 mcg.  Oxycodone 5 mg every 8 to 12 hours hours as needed overall-  stable.   # COPD/fatigue- [coughing/phlegm/ no fevers]- on albuterol/advair [smoking]- TSH- WNL;Continue  Xopenex/ipratropium nebs-; Mucinex- DM BID. stable.   # Hypokalemia-3.2- continue potassium 20 mEq liquid.-Hypocalemia: 8.4/albumin- 3.2. on Vit D 50,K  # Cecal mass ~ 3 cm s/p colonoscopy-cecal mass clinically suggestive of malignancy-biopsy high-grade dysplasia; s/p evaluation Dr. Bary Castilla.  CT OCt 5th, 2023-Filling defect in the cecum near the appendiceal orifice measuring about 3.4 by 2.9 cm, concerning for mass,. DEC [id number]- Hypermetabolic cecal mass suspicious for a villous adenoma or colon cancer causing obstruction of the appendiceal orifice.Patient not too keen on any surgical interventions at this time.  stable.   # Poorly controlled diabetes-blood sugars  113- 280;  Continue glipizide; and  Metformin. stable.   # IV mediport: functioning/Stable.  # Insomnia-continue Lunesta refill.  *appt thru mychart   # DISPOSITION:  # Keytruda today;  IVFs over 1 hour.ADD- KCL 20 IV -today.   # weekly venofer x3- either Tuesday or thrusday  # in 1 week- IVFs-1 lit /1 hourTuesdays/Thursday   # in 2 weeks-  IVFs-1 lit /1 hourTuesdays/Thursday   # # in 3 weeks-  IVFs-1 lit /1 hourTuesdays/Thursday  # follow up in 3week Tuesday- MD; labs- cbc/cmp; TSH; [name];  IVFs-1 lit /1 hour;KCL 20 IV; CT CAP prior-- Dr.B   All questions were answered. The patient knows to call the clinic with any problems, questions or concerns.    [name], MD 01/04/2023 12:00 PM

## 2023-01-04 NOTE — Assessment & Plan Note (Addendum)
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   May 05, 2022-s/p bronchoscopy- with Dr.Aleskerov-significant purulent/necrotic debris noted. Dec 23rd, 2023- PET scan: The cavitary process in the right upper lobe is similar in size to previous CTS, although demonstrates prominent peripheral hypermetabolic activity. 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 Hypermetabolic activity within the adjacent upper right lateral chest wall with right 3rd through 5th rib deformities, suspicious for direct tumor spread. No evidence of distant osseous metastatic disease; Hypermetabolic left hilar lymph node worrisome for metastatic disease. No other hypermetabolic lymph nodes or distant metastases identified.  Bronchoscopy/EBUS [facility] 26th, 2024- Dr.A]- BAL/ 4R-lymph node FNA negative for malignancy.  Patient declined further invasive procedures.  # Proceed with Keytruda # 3 today... Labs today reviewed;  acceptable for treatment today. Labs today reviewed;  acceptable for treatment today. Will plan to re-image after 3 cycles of  [place].  CT scan chest abdomen pelvis ordered.  # rash/itch of skin- G-12 sec to [place]; recommend kenalog. New script sent.   # Anemia- mild to moderate- feb 2024- iron sat- 17%; ferritin- 61- recommend venofer weekly x3-    #Right chest wall pain-/pleuritic-rib destruction malignancy versus radiation- ADDED back [march 2024; [name]]-fenatny patch 25 mcg.  Oxycodone 5 mg every 8 to 12 hours hours as needed overall-  stable.   # COPD/fatigue- [coughing/phlegm/ no fevers]- on albuterol/advair [smoking]- TSH- WNL;Continue  Xopenex/ipratropium nebs-; Mucinex- DM BID. stable.   # Hypokalemia-3.2- continue potassium 20 mEq liquid.-Hypocalemia:  8.4/albumin- 3.2. on Vit D 50,K  # Cecal mass ~ 3 cm s/p colonoscopy-cecal mass clinically suggestive of malignancy-biopsy high-grade dysplasia; s/p evaluation Dr. Bary Castilla.  CT OCt 5th, 2023-Filling defect in the cecum near the appendiceal orifice measuring about 3.4 by 2.9 cm, concerning for mass,. DEC [id number]- Hypermetabolic cecal mass suspicious for a villous adenoma or colon cancer causing obstruction of the appendiceal orifice.Patient not too keen on any surgical interventions at this time.  stable.   # Poorly controlled diabetes-blood sugars 113- 280;  Continue glipizide; and  Metformin. stable.   # IV mediport: functioning/Stable.  # Insomnia-continue Lunesta refill.  *appt thru mychart   # DISPOSITION:  # Keytruda today;  IVFs over 1 hour.ADD- KCL 20 IV -today.   # weekly venofer x3- either Tuesday or thrusday  # in 1 week- IVFs-1 lit /1 hourTuesdays/Thursday   # in 2 weeks-  IVFs-1 lit /1 hourTuesdays/Thursday   # # in 3 weeks-  IVFs-1 lit /1 hourTuesdays/Thursday  # follow up in 3week Tuesday- MD; labs- cbc/cmp; TSH; [name];  IVFs-1 lit /1 hour;KCL 20 IV; CT CAP prior-- Dr.B

## 2023-01-05 LAB — T4: T4, Total: 5 ug/dL (ref 4.5–12.0)

## 2023-01-05 MED FILL — Iron Sucrose Inj 20 MG/ML (Fe Equiv): INTRAVENOUS | Qty: 10 | Status: AC

## 2023-01-06 ENCOUNTER — Inpatient Hospital Stay: Payer: Medicare PPO

## 2023-01-06 VITALS — BP 122/74 | HR 87 | Temp 96.7°F | Resp 20

## 2023-01-06 DIAGNOSIS — C3411 Malignant neoplasm of upper lobe, right bronchus or lung: Secondary | ICD-10-CM

## 2023-01-06 DIAGNOSIS — Z5112 Encounter for antineoplastic immunotherapy: Secondary | ICD-10-CM | POA: Diagnosis not present

## 2023-01-06 MED ORDER — SODIUM CHLORIDE 0.9 % IV SOLN
Freq: Once | INTRAVENOUS | Status: AC
  Filled 2023-01-06: qty 250

## 2023-01-06 MED ORDER — SODIUM CHLORIDE 0.9% FLUSH
10.0000 mL | Freq: Once | INTRAVENOUS | Status: AC | PRN
  Administered 2023-01-06: 10 mL
  Filled 2023-01-06: qty 10

## 2023-01-06 MED ORDER — HEPARIN SOD (PORK) LOCK FLUSH 100 UNIT/ML IV SOLN
500.0000 [IU] | Freq: Once | INTRAVENOUS | Status: AC | PRN
  Administered 2023-01-06: 500 [IU]
  Filled 2023-01-06: qty 5

## 2023-01-11 ENCOUNTER — Inpatient Hospital Stay: Payer: Medicare PPO

## 2023-01-11 VITALS — BP 132/62 | HR 71 | Resp 16 | Wt 163.4 lb

## 2023-01-11 DIAGNOSIS — C3411 Malignant neoplasm of upper lobe, right bronchus or lung: Secondary | ICD-10-CM

## 2023-01-11 DIAGNOSIS — Z5112 Encounter for antineoplastic immunotherapy: Secondary | ICD-10-CM | POA: Diagnosis not present

## 2023-01-11 MED ORDER — SODIUM CHLORIDE 0.9 % IV SOLN
Freq: Once | INTRAVENOUS | Status: AC
  Filled 2023-01-11: qty 250

## 2023-01-11 MED ORDER — SODIUM CHLORIDE 0.9 % IV SOLN
200.0000 mg | Freq: Once | INTRAVENOUS | Status: AC
  Administered 2023-01-11: 200 mg via INTRAVENOUS
  Filled 2023-01-11: qty 200

## 2023-01-11 MED ORDER — HEPARIN SOD (PORK) LOCK FLUSH 100 UNIT/ML IV SOLN
500.0000 [IU] | Freq: Once | INTRAVENOUS | Status: AC | PRN
  Administered 2023-01-11: 500 [IU]
  Filled 2023-01-11: qty 5

## 2023-01-11 NOTE — Patient Instructions (Signed)

## 2023-01-12 MED FILL — Iron Sucrose Inj 20 MG/ML (Fe Equiv): INTRAVENOUS | Qty: 10 | Status: AC

## 2023-01-13 ENCOUNTER — Inpatient Hospital Stay: Payer: Medicare PPO

## 2023-01-13 VITALS — BP 114/79 | HR 90 | Temp 97.6°F | Resp 20 | Wt 164.3 lb

## 2023-01-13 DIAGNOSIS — C3411 Malignant neoplasm of upper lobe, right bronchus or lung: Secondary | ICD-10-CM

## 2023-01-13 DIAGNOSIS — Z5112 Encounter for antineoplastic immunotherapy: Secondary | ICD-10-CM | POA: Diagnosis not present

## 2023-01-13 MED ORDER — SODIUM CHLORIDE 0.9 % IV SOLN
Freq: Once | INTRAVENOUS | Status: AC
  Filled 2023-01-13: qty 250

## 2023-01-13 MED ORDER — HEPARIN SOD (PORK) LOCK FLUSH 100 UNIT/ML IV SOLN
500.0000 [IU] | Freq: Once | INTRAVENOUS | Status: AC | PRN
  Administered 2023-01-13: 500 [IU]
  Filled 2023-01-13: qty 5

## 2023-01-13 MED ORDER — SODIUM CHLORIDE 0.9% FLUSH
10.0000 mL | Freq: Once | INTRAVENOUS | Status: AC | PRN
  Administered 2023-01-13: 10 mL
  Filled 2023-01-13: qty 10

## 2023-01-14 ENCOUNTER — Ambulatory Visit
Admission: RE | Admit: 2023-01-14 | Discharge: 2023-01-14 | Disposition: A | Discharge status: Home / Self Care | Payer: Medicare PPO | Source: Ambulatory Visit | Attending: Internal Medicine | Admitting: Internal Medicine

## 2023-01-14 DIAGNOSIS — C3411 Malignant neoplasm of upper lobe, right bronchus or lung: Secondary | ICD-10-CM | POA: Insufficient documentation

## 2023-01-14 MED ORDER — IOHEXOL 300 MG/ML  SOLN
100.0000 mL | Freq: Once | INTRAMUSCULAR | Status: AC | PRN
  Administered 2023-01-14: 100 mL via INTRAVENOUS

## 2023-01-14 MED ORDER — BARIUM SULFATE 2 % PO SUSP
450.0000 mL | Freq: Once | ORAL | Status: AC
  Administered 2023-01-14: 900 mL via ORAL

## 2023-01-17 MED FILL — Iron Sucrose Inj 20 MG/ML (Fe Equiv): INTRAVENOUS | Qty: 10 | Status: AC

## 2023-01-18 ENCOUNTER — Inpatient Hospital Stay: Payer: Medicare PPO

## 2023-01-20 ENCOUNTER — Observation Stay: Payer: Medicare PPO

## 2023-01-20 ENCOUNTER — Encounter: Payer: Self-pay | Admitting: Hospice and Palliative Medicine

## 2023-01-20 ENCOUNTER — Inpatient Hospital Stay (HOSPITAL_BASED_OUTPATIENT_CLINIC_OR_DEPARTMENT_OTHER): Payer: Medicare PPO | Admitting: Hospice and Palliative Medicine

## 2023-01-20 ENCOUNTER — Emergency Department: Payer: Medicare PPO

## 2023-01-20 ENCOUNTER — Observation Stay
Admission: EM | Admit: 2023-01-20 | Discharge: 2023-01-21 | Disposition: A | Discharge status: Home / Self Care | Payer: Medicare PPO | Attending: Internal Medicine | Admitting: Internal Medicine

## 2023-01-20 ENCOUNTER — Encounter: Payer: Self-pay | Admitting: Intensive Care

## 2023-01-20 ENCOUNTER — Other Ambulatory Visit: Payer: Self-pay

## 2023-01-20 ENCOUNTER — Inpatient Hospital Stay: Payer: Medicare PPO

## 2023-01-20 VITALS — BP 85/46 | HR 83 | Temp 95.6°F | Resp 20

## 2023-01-20 DIAGNOSIS — E861 Hypovolemia: Secondary | ICD-10-CM | POA: Diagnosis not present

## 2023-01-20 DIAGNOSIS — I10 Essential (primary) hypertension: Secondary | ICD-10-CM | POA: Diagnosis present

## 2023-01-20 DIAGNOSIS — N179 Acute kidney failure, unspecified: Secondary | ICD-10-CM | POA: Diagnosis not present

## 2023-01-20 DIAGNOSIS — C3411 Malignant neoplasm of upper lobe, right bronchus or lung: Secondary | ICD-10-CM

## 2023-01-20 DIAGNOSIS — Z85828 Personal history of other malignant neoplasm of skin: Secondary | ICD-10-CM | POA: Diagnosis not present

## 2023-01-20 DIAGNOSIS — Z933 Colostomy status: Secondary | ICD-10-CM

## 2023-01-20 DIAGNOSIS — E440 Moderate protein-calorie malnutrition: Secondary | ICD-10-CM | POA: Insufficient documentation

## 2023-01-20 DIAGNOSIS — F32A Depression, unspecified: Secondary | ICD-10-CM | POA: Diagnosis present

## 2023-01-20 DIAGNOSIS — J449 Chronic obstructive pulmonary disease, unspecified: Secondary | ICD-10-CM | POA: Insufficient documentation

## 2023-01-20 DIAGNOSIS — E860 Dehydration: Secondary | ICD-10-CM | POA: Diagnosis not present

## 2023-01-20 DIAGNOSIS — W19XXXA Unspecified fall, initial encounter: Secondary | ICD-10-CM | POA: Diagnosis present

## 2023-01-20 DIAGNOSIS — Z7984 Long term (current) use of oral hypoglycemic drugs: Secondary | ICD-10-CM | POA: Diagnosis not present

## 2023-01-20 DIAGNOSIS — I959 Hypotension, unspecified: Principal | ICD-10-CM | POA: Insufficient documentation

## 2023-01-20 DIAGNOSIS — F17210 Nicotine dependence, cigarettes, uncomplicated: Secondary | ICD-10-CM | POA: Diagnosis not present

## 2023-01-20 DIAGNOSIS — Z79899 Other long term (current) drug therapy: Secondary | ICD-10-CM | POA: Diagnosis not present

## 2023-01-20 DIAGNOSIS — E119 Type 2 diabetes mellitus without complications: Secondary | ICD-10-CM

## 2023-01-20 DIAGNOSIS — Z96653 Presence of artificial knee joint, bilateral: Secondary | ICD-10-CM | POA: Insufficient documentation

## 2023-01-20 DIAGNOSIS — Z5112 Encounter for antineoplastic immunotherapy: Secondary | ICD-10-CM | POA: Diagnosis not present

## 2023-01-20 LAB — BASIC METABOLIC PANEL
Anion gap: 10 (ref 5–15)
BUN: 34 mg/dL — ABNORMAL HIGH (ref 8–23)
CO2: 24 mmol/L (ref 22–32)
Calcium: 8.1 mg/dL — ABNORMAL LOW (ref 8.9–10.3)
Chloride: 103 mmol/L (ref 98–111)
Creatinine, Ser: 1.43 mg/dL — ABNORMAL HIGH (ref 0.61–1.24)
GFR, Estimated: 54 mL/min — ABNORMAL LOW (ref >60)
Glucose, Bld: 173 mg/dL — ABNORMAL HIGH (ref 70–99)
Potassium: 3.7 mmol/L (ref 3.5–5.1)
Sodium: 137 mmol/L (ref 135–145)

## 2023-01-20 LAB — CBC WITH DIFFERENTIAL/PLATELET
Abs Immature Granulocytes: 0.05 10*3/uL (ref 0.00–0.07)
Basophils Absolute: 0 10*3/uL (ref 0.0–0.1)
Basophils Relative: 0 %
Eosinophils Absolute: 0.1 10*3/uL (ref 0.0–0.5)
Eosinophils Relative: 1 %
HCT: 37.2 % — ABNORMAL LOW (ref 39.0–52.0)
Hemoglobin: 12.1 g/dL — ABNORMAL LOW (ref 13.0–17.0)
Immature Granulocytes: 1 %
Lymphocytes Relative: 12 %
Lymphs Abs: 1.2 10*3/uL (ref 0.7–4.0)
MCH: 28.1 pg (ref 26.0–34.0)
MCHC: 32.5 g/dL (ref 30.0–36.0)
MCV: 86.5 fL (ref 80.0–100.0)
Monocytes Absolute: 0.7 10*3/uL (ref 0.1–1.0)
Monocytes Relative: 7 %
Neutro Abs: 7.8 10*3/uL — ABNORMAL HIGH (ref 1.7–7.7)
Neutrophils Relative %: 79 %
Platelets: 242 10*3/uL (ref 150–400)
RBC: 4.3 MIL/uL (ref 4.22–5.81)
RDW: 14.3 % (ref 11.5–15.5)
WBC: 10 10*3/uL (ref 4.0–10.5)
nRBC: 0 % (ref 0.0–0.2)

## 2023-01-20 LAB — URINALYSIS, ROUTINE W REFLEX MICROSCOPIC
Bilirubin Urine: NEGATIVE
Glucose, UA: NEGATIVE mg/dL
Ketones, ur: NEGATIVE mg/dL
Nitrite: NEGATIVE
Protein, ur: NEGATIVE mg/dL
Specific Gravity, Urine: 1.014 (ref 1.005–1.030)
pH: 5 (ref 5.0–8.0)

## 2023-01-20 LAB — LACTIC ACID, PLASMA
Lactic Acid, Venous: 2.5 mmol/L (ref 0.5–1.9)
Lactic Acid, Venous: 1.5 mmol/L (ref 0.5–1.9)

## 2023-01-20 LAB — CBC
HCT: 34.6 % — ABNORMAL LOW (ref 39.0–52.0)
HCT: 30.4 % — ABNORMAL LOW (ref 39.0–52.0)
Hemoglobin: 10.9 g/dL — ABNORMAL LOW (ref 13.0–17.0)
Hemoglobin: 9.5 g/dL — ABNORMAL LOW (ref 13.0–17.0)
MCH: 28.3 pg (ref 26.0–34.0)
MCH: 28 pg (ref 26.0–34.0)
MCHC: 31.5 g/dL (ref 30.0–36.0)
MCHC: 31.3 g/dL (ref 30.0–36.0)
MCV: 89.9 fL (ref 80.0–100.0)
MCV: 89.7 fL (ref 80.0–100.0)
Platelets: 194 10*3/uL (ref 150–400)
Platelets: 153 10*3/uL (ref 150–400)
RBC: 3.85 MIL/uL — ABNORMAL LOW (ref 4.22–5.81)
RBC: 3.39 MIL/uL — ABNORMAL LOW (ref 4.22–5.81)
RDW: 14.3 % (ref 11.5–15.5)
RDW: 14.3 % (ref 11.5–15.5)
WBC: 8.2 10*3/uL (ref 4.0–10.5)
WBC: 5.5 10*3/uL (ref 4.0–10.5)
nRBC: 0 % (ref 0.0–0.2)
nRBC: 0 % (ref 0.0–0.2)

## 2023-01-20 LAB — CMP (CANCER CENTER ONLY)
ALT: 7 U/L (ref 0–44)
AST: 14 U/L — ABNORMAL LOW (ref 15–41)
Albumin: 3.6 g/dL (ref 3.5–5.0)
Alkaline Phosphatase: 90 U/L (ref 38–126)
Anion gap: 8 (ref 5–15)
BUN: 40 mg/dL — ABNORMAL HIGH (ref 8–23)
CO2: 27 mmol/L (ref 22–32)
Calcium: 8.9 mg/dL (ref 8.9–10.3)
Chloride: 100 mmol/L (ref 98–111)
Creatinine: 1.73 mg/dL — ABNORMAL HIGH (ref 0.61–1.24)
GFR, Estimated: 43 mL/min — ABNORMAL LOW (ref >60)
Glucose, Bld: 113 mg/dL — ABNORMAL HIGH (ref 70–99)
Potassium: 3.9 mmol/L (ref 3.5–5.1)
Sodium: 135 mmol/L (ref 135–145)
Total Bilirubin: 0.5 mg/dL (ref 0.3–1.2)
Total Protein: 7.1 g/dL (ref 6.5–8.1)

## 2023-01-20 LAB — CBG MONITORING, ED: Glucose-Capillary: 179 mg/dL — ABNORMAL HIGH (ref 70–99)

## 2023-01-20 LAB — CREATININE, SERUM
Creatinine, Ser: 1.35 mg/dL — ABNORMAL HIGH (ref 0.61–1.24)
GFR, Estimated: 58 mL/min — ABNORMAL LOW (ref >60)

## 2023-01-20 LAB — MAGNESIUM: Magnesium: 2.4 mg/dL (ref 1.7–2.4)

## 2023-01-20 LAB — PROCALCITONIN: Procalcitonin: <0.1 ng/mL

## 2023-01-20 LAB — GLUCOSE, CAPILLARY: Glucose-Capillary: 72 mg/dL (ref 70–99)

## 2023-01-20 MED ORDER — INSULIN ASPART 100 UNIT/ML IJ SOLN: 0.0000 [IU] | Freq: Every day | INTRAMUSCULAR | Status: DC

## 2023-01-20 MED ORDER — LATANOPROST 0.005 % OP SOLN: 1.0000 [drp] | Freq: Every day | OPHTHALMIC | Status: DC

## 2023-01-20 MED ORDER — SODIUM CHLORIDE 0.9% FLUSH
10.0000 mL | Freq: Once | INTRAVENOUS | Status: AC | PRN
  Administered 2023-01-20: 10 mL
  Filled 2023-01-20: qty 10

## 2023-01-20 MED ORDER — ESZOPICLONE 1 MG PO TABS: ORAL_TABLET | ORAL | 1 refills | Status: DC

## 2023-01-20 MED ORDER — METRONIDAZOLE 500 MG/100ML IV SOLN
500.0000 mg | Freq: Once | INTRAVENOUS | Status: AC
  Administered 2023-01-20: 500 mg via INTRAVENOUS
  Filled 2023-01-20: qty 100

## 2023-01-20 MED ORDER — MOMETASONE FURO-FORMOTEROL FUM 100-5 MCG/ACT IN AERO
2.0000 | INHALATION_SPRAY | Freq: Two times a day (BID) | RESPIRATORY_TRACT | Status: DC
  Administered 2023-01-21: 2 via RESPIRATORY_TRACT
  Filled 2023-01-20 (×2): qty 8.8

## 2023-01-20 MED ORDER — PREGABALIN 50 MG PO CAPS
50.0000 mg | ORAL_CAPSULE | Freq: Two times a day (BID) | ORAL | Status: DC
  Administered 2023-01-20 – 2023-01-21 (×2): 50 mg via ORAL
  Filled 2023-01-20 (×2): qty 1

## 2023-01-20 MED ORDER — SODIUM CHLORIDE 0.9 % IV SOLN
2.0000 g | Freq: Once | INTRAVENOUS | Status: AC
  Administered 2023-01-20: 2 g via INTRAVENOUS
  Filled 2023-01-20: qty 12.5

## 2023-01-20 MED ORDER — ACETAMINOPHEN 650 MG RE SUPP: 650.0000 mg | Freq: Four times a day (QID) | RECTAL | Status: DC | PRN

## 2023-01-20 MED ORDER — LACTATED RINGERS IV SOLN: INTRAVENOUS | Status: AC

## 2023-01-20 MED ORDER — HEPARIN SOD (PORK) LOCK FLUSH 100 UNIT/ML IV SOLN
500.0000 [IU] | Freq: Once | INTRAVENOUS | Status: DC | PRN
  Filled 2023-01-20: qty 5

## 2023-01-20 MED ORDER — SODIUM CHLORIDE 0.9% FLUSH: 3.0000 mL | INTRAVENOUS | Status: DC | PRN

## 2023-01-20 MED ORDER — ONDANSETRON HCL 4 MG PO TABS: 4.0000 mg | ORAL_TABLET | Freq: Four times a day (QID) | ORAL | Status: DC | PRN

## 2023-01-20 MED ORDER — ONDANSETRON HCL 4 MG/2ML IJ SOLN
8.0000 mg | Freq: Once | INTRAMUSCULAR | Status: AC
  Administered 2023-01-20: 8 mg via INTRAVENOUS
  Filled 2023-01-20: qty 4

## 2023-01-20 MED ORDER — VANCOMYCIN HCL IN DEXTROSE 1-5 GM/200ML-% IV SOLN
1000.0000 mg | Freq: Once | INTRAVENOUS | Status: AC
  Administered 2023-01-20: 1000 mg via INTRAVENOUS
  Filled 2023-01-20: qty 200

## 2023-01-20 MED ORDER — OXYCODONE HCL 5 MG PO TABS
5.0000 mg | ORAL_TABLET | Freq: Three times a day (TID) | ORAL | 0 refills | Status: DC | PRN
Start: 2023-01-20 — End: 2023-02-22

## 2023-01-20 MED ORDER — ENOXAPARIN SODIUM 40 MG/0.4ML IJ SOSY
40.0000 mg | PREFILLED_SYRINGE | INTRAMUSCULAR | Status: DC
  Administered 2023-01-20: 40 mg via SUBCUTANEOUS
  Filled 2023-01-20: qty 0.4

## 2023-01-20 MED ORDER — POLYETHYLENE GLYCOL 3350 17 G PO PACK: 17.0000 g | PACK | Freq: Every day | ORAL | Status: DC | PRN

## 2023-01-20 MED ORDER — SODIUM CHLORIDE 0.9 % IV SOLN: 250.0000 mL | INTRAVENOUS | Status: DC | PRN

## 2023-01-20 MED ORDER — SODIUM CHLORIDE 0.9 % IV SOLN
INTRAVENOUS | Status: DC
  Filled 2023-01-20 (×2): qty 250

## 2023-01-20 MED ORDER — OXYCODONE HCL 5 MG PO TABS: 5.0000 mg | ORAL_TABLET | Freq: Three times a day (TID) | ORAL | Status: DC | PRN

## 2023-01-20 MED ORDER — BUDESON-GLYCOPYRROL-FORMOTEROL 160-9-4.8 MCG/ACT IN AERO: 2.0000 | INHALATION_SPRAY | Freq: Two times a day (BID) | RESPIRATORY_TRACT | Status: DC

## 2023-01-20 MED ORDER — SODIUM CHLORIDE 0.9 % IV SOLN
Freq: Once | INTRAVENOUS | Status: AC
  Filled 2023-01-20: qty 250

## 2023-01-20 MED ORDER — ADULT MULTIVITAMIN W/MINERALS CH
1.0000 | ORAL_TABLET | Freq: Every day | ORAL | Status: DC
  Administered 2023-01-21: 1 via ORAL
  Filled 2023-01-20: qty 1

## 2023-01-20 MED ORDER — SODIUM CHLORIDE 0.9 % IV SOLN: Freq: Once | INTRAVENOUS | Status: AC

## 2023-01-20 MED ORDER — VENLAFAXINE HCL ER 75 MG PO CP24
225.0000 mg | ORAL_CAPSULE | Freq: Every day | ORAL | Status: DC
  Administered 2023-01-21: 225 mg via ORAL
  Filled 2023-01-20: qty 3

## 2023-01-20 MED ORDER — FENTANYL 25 MCG/HR TD PT72: 1.0000 | MEDICATED_PATCH | TRANSDERMAL | Status: DC

## 2023-01-20 MED ORDER — TAMSULOSIN HCL 0.4 MG PO CAPS
0.4000 mg | ORAL_CAPSULE | Freq: Every day | ORAL | Status: DC
  Administered 2023-01-20: 0.4 mg via ORAL
  Filled 2023-01-20: qty 1

## 2023-01-20 MED ORDER — SODIUM CHLORIDE 0.9 % IV BOLUS (SEPSIS)
1000.0000 mL | Freq: Once | INTRAVENOUS | Status: AC
  Administered 2023-01-20: 1000 mL via INTRAVENOUS

## 2023-01-20 MED ORDER — ACETAMINOPHEN 325 MG PO TABS: 650.0000 mg | ORAL_TABLET | Freq: Four times a day (QID) | ORAL | Status: DC | PRN

## 2023-01-20 MED ORDER — SODIUM CHLORIDE 0.9% FLUSH
3.0000 mL | Freq: Two times a day (BID) | INTRAVENOUS | Status: DC
  Administered 2023-01-20 – 2023-01-21 (×2): 3 mL via INTRAVENOUS

## 2023-01-20 MED ORDER — SODIUM CHLORIDE 0.9% FLUSH: 3.0000 mL | Freq: Two times a day (BID) | INTRAVENOUS | Status: DC

## 2023-01-20 MED ORDER — INSULIN ASPART 100 UNIT/ML IJ SOLN
0.0000 [IU] | Freq: Three times a day (TID) | INTRAMUSCULAR | Status: DC
  Administered 2023-01-20: 2 [IU] via SUBCUTANEOUS
  Filled 2023-01-20: qty 1

## 2023-01-20 MED ORDER — MONTELUKAST SODIUM 10 MG PO TABS
10.0000 mg | ORAL_TABLET | Freq: Every day | ORAL | Status: DC
  Administered 2023-01-20: 10 mg via ORAL
  Filled 2023-01-20: qty 1

## 2023-01-20 MED ORDER — ALBUTEROL SULFATE (2.5 MG/3ML) 0.083% IN NEBU
2.5000 mg | INHALATION_SOLUTION | RESPIRATORY_TRACT | Status: DC | PRN
  Administered 2023-01-21: 2.5 mg via RESPIRATORY_TRACT

## 2023-01-20 MED ORDER — ONDANSETRON HCL 4 MG/2ML IJ SOLN: 4.0000 mg | Freq: Four times a day (QID) | INTRAMUSCULAR | Status: DC | PRN

## 2023-01-20 NOTE — Assessment & Plan Note (Signed)
Secondary to malignancy. -Dietitian consult

## 2023-01-20 NOTE — Assessment & Plan Note (Signed)
Patient had few falls this morning and was complaining of some lower back pain.  Most likely secondary to orthostatic hypotension with GI losses.  No head injury.  No other significant injury noted. -Will check lower back imaging -PT evaluation

## 2023-01-20 NOTE — Consult Note (Signed)
PHARMACY -  BRIEF ANTIBIOTIC NOTE   Pharmacy has received consult(s) for sepsis from an ED provider.  The patient's profile has been reviewed for ht/wt/allergies/indication/available labs.    One time order(s) placed for cefepime and vancomycin   Further antibiotics/pharmacy consults should be ordered by admitting physician if indicated.                       Thank you, Ronnald Ramp 01/20/2023  3:03 PM

## 2023-01-20 NOTE — ED Triage Notes (Signed)
Stage 4 lung cancer patient. Reports Tuesday he had stomach virus. Had multiple falls Wednesday and Today. Reports LOC with one fall. Wife reports he would have a blank stare before some falls.   Takes immunotherapy IV drug once every three weeks. Last had almost two weeks ago.   A&O x4 in triage.

## 2023-01-20 NOTE — Sepsis Progress Note (Signed)
Sepsis protocol monitored by eLink ?

## 2023-01-20 NOTE — Assessment & Plan Note (Signed)
Patient with locally advanced non-small cell lung cancer which is being treated with immunotherapy. -Continue with outpatient management

## 2023-01-20 NOTE — Consult Note (Signed)
CODE SEPSIS - PHARMACY COMMUNICATION  **Broad Spectrum Antibiotics should be administered within 1 hour of Sepsis diagnosis**  Time Code Sepsis Called/Page Received: 1500  Antibiotics Ordered: cefepime and vancomycin  Time of 1st antibiotic administration: 1518  Additional action taken by pharmacy: None     Ronnald Ramp ,PharmD Clinical Pharmacist  01/20/2023  3:30 PM

## 2023-01-20 NOTE — H&P (Signed)
History and Physical    Patient: Rodney Vega ZOX:[id number] DOB: Jul 15, 1956 DOA: 01/20/2023 DOS: the patient was seen and examined on 01/20/2023 PCP: [name], MD  Patient coming from: Home  Chief Complaint:  Chief Complaint  Patient presents with   Fall   HPI: Rodney Vega is a 67 y.o. male with medical history significant of type 2 diabetes mellitus, locally advanced lung cancer on immunotherapy, depression, COPD and hypertension was brought to ED with concern of fall and significant hypotension.  Patient was apparently sick with stomach bug on Tuesday which lasted for 24 hours.  Symptoms resolved.  He normally gets IV fluid at cancer center twice weekly, missed his Tuesday appointment.  He was feeling very weak and had couple of falls earlier in the morning.  He went to cancer center and found to be very hypotensive which did not respond initially to IV fluid so he was sent to ED for further evaluation.  Patient denies any worsening of his cough, no congestion, no chest pain, no fever or chills.  No more nausea, vomiting or diarrhea.  No urinary symptoms.  ED course.  On presentation patient has softer blood pressure at 76/51, afebrile.  No leukocytosis, BMP with AKI and creatinine at 1.73 with baseline less than 1.  Lactic acid of 2.5.  Initially code sepsis was called.  Received IV fluid and broad-spectrum antibiotics.  Lactic acidosis most likely secondary to dehydration.  No other obvious source of infection so sepsis ruled out and antibiotics will not be continued at this time.  Blood cultures were drawn from ED.   Review of Systems: As mentioned in the history of present illness. All other systems reviewed and are negative. Past Medical History:  Diagnosis Date   Acute on chronic respiratory failure    Anemia    Cancer    Colostomy present    COPD (chronic obstructive pulmonary disease)    Degenerative arthritis of knee    Depression    Diabetes mellitus without  complication    Diverticulitis    Dyspnea    Glaucoma    since 2004   Hernia 1979   History of kidney stones    History of methicillin resistant staphylococcus aureus (MRSA)    Hypertension    Hypokalemia    Marijuana use    Mass of cecum    Nephrolithiasis    Neuromuscular disorder    Personal history of tobacco use, presenting hazards to health    Pneumonia    Port-A-Cath in place    Pre-diabetes    Primary cancer of right upper lobe of lung    SCC of lung (small cell carcinoma)    Screening for obesity    Severe sepsis    Special screening for malignant neoplasms, colon    Tobacco use    Wears dentures    full upper and lower   Past Surgical History:  Procedure Laterality Date   BACK SURGERY  1994, 2012   lumbar bulging disc   BRONCHOSCOPY  05/05/2022   CATARACT EXTRACTION W/PHACO Left 01/20/2021   Procedure: CATARACT EXTRACTION PHACO AND INTRAOCULAR LENS PLACEMENT (IOC) LEFT;  Surgeon: Galen Manila, MD;  Location: [facility];  Service: Ophthalmology;  Laterality: Left;  10.13 0:52.1   COLONOSCOPY  8119,1478   [facility]/Dr. Carmell Austria sessile polyp,68mm, found in rectum,multiple diverticula were found in the sigmoid, in descending and in transverse colon   COLONOSCOPY WITH PROPOFOL N/A 10/22/2021   Procedure: COLONOSCOPY WITH PROPOFOL;  Surgeon: Elfredia Nevins  Casimiro Needle, DO;  Location: [facility];  Service: Gastroenterology;  Laterality: N/A;  DM   COLOSTOMY  04/20/2013   EYE SURGERY     HERNIA REPAIR  1979   inguinal   IR IMAGING GUIDED PORT INSERTION  06/10/2021   JOINT REPLACEMENT Right    knee   KNEE ARTHROPLASTY Right 05/08/2018   Procedure: COMPUTER ASSISTED TOTAL KNEE ARTHROPLASTY;  Surgeon: Donato Heinz, MD;  Location: [facility];  Service: Orthopedics;  Laterality: Right;   KNEE ARTHROPLASTY Left 11/16/2019   Procedure: COMPUTER ASSISTED TOTAL KNEE ARTHROPLASTY;  Surgeon: Donato Heinz, MD;  Location: [facility];  Service: Orthopedics;  Laterality:  Left;   LAPAROTOMY  04/20/2013   colon resection with colosotmy   LITHOTRIPSY  2013   LUNG BIOPSY  2023   RIGID BRONCHOSCOPY N/A 10/29/2022   Procedure: RIGID BRONCHOSCOPY;  Surgeon: [name], MD;  Location: [facility];  Service: Thoracic;  Laterality: N/A;   TONSILLECTOMY AND ADENOIDECTOMY  1962   VIDEO BRONCHOSCOPY WITH ENDOBRONCHIAL NAVIGATION N/A 05/05/2022   Procedure: VIDEO BRONCHOSCOPY WITH ENDOBRONCHIAL NAVIGATION;  Surgeon: [name], MD;  Location: [facility];  Service: Thoracic;  Laterality: N/A;   VIDEO BRONCHOSCOPY WITH ENDOBRONCHIAL ULTRASOUND N/A 05/05/2022   Procedure: VIDEO BRONCHOSCOPY WITH ENDOBRONCHIAL ULTRASOUND;  Surgeon: [name], MD;  Location: [facility];  Service: Thoracic;  Laterality: N/A;   VIDEO BRONCHOSCOPY WITH ENDOBRONCHIAL ULTRASOUND N/A 10/29/2022   Procedure: VIDEO BRONCHOSCOPY WITH ENDOBRONCHIAL ULTRASOUND;  Surgeon: [name], MD;  Location: [facility];  Service: Thoracic;  Laterality: N/A;   Social History:  reports that he has been smoking cigarettes. He has a 30.00 pack-year smoking history. He has never used smokeless tobacco. He reports current alcohol use of about 2.0 standard drinks of alcohol per week. He reports current drug use. Drug: Marijuana.  Allergies  Allergen Reactions   Paclitaxel Shortness Of Breath   Ambien [Zolpidem]     Causes confusion    Nsaids     Abdominal pain   Aleve [Naproxen Sodium] Itching    Family History  Problem Relation Age of Onset   Diabetes Mother    Heart disease Mother    Heart attack Father    Prostate cancer Father     Prior to Admission medications   Medication Sig Start Date End Date Taking? Authorizing Provider  acetaminophen (TYLENOL) 500 MG tablet Take 2 tablets (1,000 mg total) by mouth daily as needed. Home med. 12/03/21  Yes Darlin Priestly, MD  albuterol (PROVENTIL) (2.5 MG/3ML) 0.083% nebulizer solution Take 2.5 mg by nebulization every 4 (four) hours as needed for wheezing or  shortness of breath. 03/16/22  Yes [provider]  albuterol (VENTOLIN HFA) 108 (90 Base) MCG/ACT inhaler SMARTSIG:2 inhalation Via Inhaler Every 6 Hours PRN 06/21/22  Yes Borders, Daryl Eastern, NP  BREZTRI AEROSPHERE 160-9-4.8 MCG/ACT AERO Inhale 2 puffs into the lungs 2 (two) times daily. 03/26/22  Yes [provider]  dorzolamide-timolol (COSOPT) 22.3-6.8 MG/ML ophthalmic solution Place 1 drop into both eyes 2 (two) times daily.   Yes [provider]  eszopiclone (LUNESTA) 1 MG TABS tablet TAKE 1 TABLET(1 MG) BY MOUTH AT BEDTIME AS NEEDED FOR SLEEP 01/20/23  Yes Borders, Daryl Eastern, NP  fentaNYL (DURAGESIC) 25 MCG/HR Place 1 patch onto the skin every 3 (three) days. 12/07/22  Yes Borders, Daryl Eastern, NP  glipiZIDE (GLUCOTROL) 5 MG tablet TAKE 1 TABLET(5 MG) BY MOUTH DAILY BEFORE BREAKFAST 09/28/22  Yes [name], MD  ipratropium-albuterol (DUONEB) 0.5-2.5 (3)  MG/3ML SOLN Take 3 mLs by nebulization every 6 (six) hours as needed. 08/30/22  Yes Borders, Daryl Eastern, NP  latanoprost (XALATAN) 0.005 % ophthalmic solution Place 1 drop into both eyes at bedtime.   Yes [provider]  lidocaine-prilocaine (EMLA) cream Apply 1 Application topically as needed. 10/05/22  Yes [name], MD  metFORMIN (GLUCOPHAGE) 500 MG tablet Take 1 tablet (500 mg total) by mouth 2 (two) times daily with a meal. 08/03/22  Yes Borders, Daryl Eastern, NP  montelukast (SINGULAIR) 10 MG tablet TAKE 1 TABLET(10 MG) BY MOUTH AT BEDTIME 09/17/22  Yes [name], MD  Multiple Vitamin (MULTIVITAMIN WITH MINERALS) TABS tablet Take 1 tablet by mouth daily. 12/04/21  Yes Darlin Priestly, MD  naloxone [facility]) nasal spray 4 mg/0.1 mL Place 1 spray into the nose once. 12/07/22  Yes [provider]  olmesartan (BENICAR) 40 MG tablet Take 1 tablet (40 mg total) by mouth every morning. 11/17/22  Yes [name], MD  oxyCODONE (OXY IR/ROXICODONE) 5 MG immediate release tablet Take 1  tablet (5 mg total) by mouth every 8 (eight) hours as needed for severe pain. 01/20/23  Yes Borders, Daryl Eastern, NP  pregabalin (LYRICA) 50 MG capsule Take 1 capsule (50 mg total) by mouth 2 (two) times daily. 10/05/22  Yes [name], MD  prochlorperazine (COMPAZINE) 10 MG tablet Take 10 mg by mouth every 6 (six) hours as needed for nausea or vomiting.   Yes [provider]  propranolol ER (INDERAL LA) 60 MG 24 hr capsule TAKE 1 CAPSULE(60 MG) BY MOUTH DAILY 07/05/22  Yes [name], MD  tamsulosin (FLOMAX) 0.4 MG CAPS capsule Take 0.4 mg by mouth daily after supper.   Yes [provider]  triamcinolone ointment (KENALOG) 0.5 % Apply 1 Application topically 2 (two) times daily. 01/04/23  Yes [name], MD  venlafaxine XR (EFFEXOR-XR) 75 MG 24 hr capsule Take 225 mg by mouth daily with breakfast. 09/24/19  Yes [provider]  Accu-Chek Softclix Lancets lancets USE AS DIRECTED FOUR TIMES DAILY 05/31/22   [name], MD  blood glucose meter kit and supplies KIT Check your blood glucose levels twice a day; once in the morning before breakfast; and once in the evening after dinner. 06/29/21   [name], MD  chlorpheniramine-HYDROcodone (TUSSIONEX) 10-8 MG/5ML Take 5 mLs by mouth at bedtime as needed for cough. Patient not taking: Reported on 12/09/2022 12/07/22   Borders, Daryl Eastern, NP  clobetasol cream (TEMOVATE) 0.05 % Apply 1 Application topically 2 (two) times daily. Patient not taking: Reported on 01/20/2023 07/01/22   Borders, Daryl Eastern, NP  dextromethorphan-guaiFENesin [facility] DM) 30-600 MG 12hr tablet Take 1 tablet by mouth 2 (two) times daily as needed for cough. Patient not taking: Reported on 01/20/2023 12/03/21   Darlin Priestly, MD  glucose blood test strip Check your blood glucose levels twice a day; once in the morning before breakfast; and once in the evening after dinner. 06/29/21   [name], MD  guaiFENesin-codeine  100-10 MG/5ML syrup Take 5 mLs by mouth daily as needed for cough. Patient not taking: Reported on 12/09/2022 11/02/21   [name], MD  potassium chloride 20 MEQ/15ML (10%) SOLN Take 15 mLs (20 mEq total) by mouth 2 (two) times daily. Patient not taking: Reported on 12/09/2022 09/06/22   [name], MD  Respiratory Therapy Supplies (NEBULIZER) DEVI Use as directed 11/02/21   [name], MD  vitamin B-12 (CYANOCOBALAMIN) 500 MCG tablet Take 1 tablet by mouth daily. Patient not taking: Reported on 01/20/2023    [provider]  Vitamin D, Ergocalciferol, (DRISDOL) 1.25 MG (50000 UNIT) CAPS capsule TAKE 1 CAPSULE BY MOUTH 1 TIME A WEEK Patient not taking: Reported on 01/20/2023 09/28/22   [name], MD    Physical Exam: Vitals:   01/20/23 1417 01/20/23 1423 01/20/23 1430 01/20/23 1527  BP: 95/61  (!) 88/57 (!) 92/55  Pulse:   72 72  Resp:  Temp:  97.8 F (36.6 C)  97.8 F (36.6 C)  TempSrc:  Oral  Oral  SpO2:  96% 96% 96%  Weight:      Height:       Vitals:   01/20/23 1417 01/20/23 1423 01/20/23 1430 01/20/23 1527  BP: 95/61  (!) 88/57 (!) 92/55  Pulse:   72 72  Resp:  Temp:  97.8 F (36.6 C)  97.8 F (36.6 C)  TempSrc:  Oral  Oral  SpO2:  96% 96% 96%  Weight:      Height:       General: Vital signs reviewed.  Patient is well-developed and well-nourished, in no acute distress and cooperative with exam.  Head: Normocephalic and atraumatic. Eyes: EOMI, conjunctivae normal, no scleral icterus.  Neck: Supple, trachea midline, normal ROM,  Cardiovascular: RRR, S1 normal, S2 normal, no murmurs, gallops, or rubs. Pulmonary/Chest: Clear to auscultation bilaterally, no wheezes, rales, or rhonchi. Abdominal: Soft, non-tender, non-distended, BS +, colostomy in place.   Extremities: No lower extremity edema bilaterally,  pulses symmetric and intact bilaterally. No cyanosis or clubbing. Neurological: A&O x3, Strength is  normal and symmetric bilaterally, cranial nerve II-XII are grossly intact, no focal motor deficit, sensory intact to light touch bilaterally.  Skin: Warm, dry and intact. No rashes or erythema. Psychiatric: Normal mood and affect.  .  Data Reviewed: Prior data reviewed as mentioned above  Assessment and Plan: * Hypotension Most likely secondary dehydration with recent GI illness and keep taking his blood pressure medications.  Patient normally gets IV fluid twice weekly at cancer center, missed on Tuesday due to vomiting and diarrhea which has been resolved now. Blood pressure started improving with IV fluid. Patient did not met sepsis criteria and there is no other obvious source of infection. Recently he completed a course of antibiotics for concern of pneumonia which was given to him by his pulmonologist. No worsening of his baseline cough or other symptoms. Remained on room air. Received broad-spectrum antibiotics and IV fluid for initial concern of sepsis which is ruled out at this time. I will not continue antibiotics for now. -Admit to MedSurg -Continue with IV fluid -Holding home antihypertensives -Follow-up cultures result which were drawn in ED  AKI (acute kidney injury) Creatinine of 1.7 initially with baseline less than 1.  More consistent with dehydration. -Monitor renal function with IV fluid -Avoid nephrotoxins  Fall Patient had few falls this morning and was complaining of some lower back pain.  Most likely secondary to orthostatic hypotension with GI losses.  No head injury.  No other significant injury noted. -Will check lower back imaging -PT evaluation  Diabetes mellitus without complication Seems well-controlled.  On glipizide at home -SSI  Hypertension Currently hypotensive. -Holding home propranolol and olmesartan  Malnutrition of moderate degree Secondary to malignancy. -Dietitian consult  Primary cancer of right upper lobe of lung Patient with  locally advanced non-small cell lung cancer  which is being treated with immunotherapy. -Continue with outpatient management  Colostomy in place -Colostomy care  COPD (chronic obstructive pulmonary disease) No concern of exacerbation at this time. -Continue home bronchodilators  Depression Continue home medications.    Advance Care Planning:   Code Status: DNR discussed with patient and wife at bedside  Consults: None  Family Communication: Discussed with wife  Severity of Illness: The appropriate patient status for this patient is OBSERVATION. Observation status is judged to be reasonable and necessary in order to provide the required intensity of service to ensure the patient's safety. The patient's presenting symptoms, physical exam findings, and initial radiographic and laboratory data in the context of their medical condition is felt to place them at decreased risk for further clinical deterioration. Furthermore, it is anticipated that the patient will be medically stable for discharge from the hospital within 2 midnights of admission.   This record has been created using Conservation officer, historic buildings. Errors have been sought and corrected,but may not always be located. Such creation errors do not reflect on the standard of care.   Author: Arnetha Courser, MD 01/20/2023 4:43 PM  For on call review [email or web address].

## 2023-01-20 NOTE — ED Notes (Signed)
Lab called this RN and asked where the most recent labs seemed too watered down from being drawn from port and lab values might not be correct lab is coming to draw labs again informed MD of this information also.

## 2023-01-20 NOTE — Consult Note (Signed)
WOC Nurse ostomy consult note Stoma type/location: LLQ colostomy. Patient had surgery in 2014 Stomal assessment/size: Not measured today Peristomal assessment: Not seen today Treatment options for stomal/peristomal skin: Skin barrier ring Output: Brown stool Ostomy pouching: 2pc. 2 and 1/4 inch pouching system. Patient wears a Theatre manager system, which is non formulary for [place]. We will substitute a Hollister 2-piece pouching system and patient may return to his previous pouch upon discharge or use those from home if he desires. Education provided: None Enrolled patient in [employer] DC program: No. Patient with established ostomy and supplier.   Supplies: 2-piece 2 and 1/4 inch pouching system:  Pouch is [employer] # [phone], skin barrier is Press photographer # [phone]. Skin barrier ring that is stretch and placed around stoma prior to placing pouching system is [employer] # [phone].  I have asked nursing to provide 5 of each to the bedside.  Ostomy clamp is [employer] [place] # [phone] (may be placed over our Lock and Roll closure, if he desires). Nursing to provide 1 ostomy clamp.  WOC nursing team will not follow, but will remain available to this patient, the nursing and medical teams.  Please re-consult if needed.  Thank you for inviting [place] to participate in this patient's Plan of Care.  Ladona Mow, MSN, RN, CNS, GNP, Leda Min, [facility], [employer] phone:  [phone] .

## 2023-01-20 NOTE — ED Notes (Signed)
Patient to CT.

## 2023-01-20 NOTE — Progress Notes (Signed)
Pt here for scheduled IVF. Upon arrival wife shared that pt had stomach bug on Tuesday, since then pt has not felt good. Reports pt fell 5 times since Wednesday evening and EMS was called to house this am because wife couldn't get him up. Wife discussed with EMS that pt had appt  here and EMS assisted pt to car so wife could bring him to clinic. BP on arrival 76/51. [facility] added on to evaluate patient.  1332-BP continues to be low 85/46 after 1 1/2 L NS. Pt shaky and unsteady [name] Borders NP and Dr Donneta Romberg agree pt should be evaluated in ED. Wife in agreement and pt agreeable. Transported to ED by nursing staff

## 2023-01-20 NOTE — ED Notes (Signed)
Pt up to toilet with one assist to toilet. Patient has an unsteady gait and needs to have one assist otherwise fall might occur at this point. Urine sample provided and sent to lab.

## 2023-01-20 NOTE — Progress Notes (Signed)
BP today in fluid clinic 76/51. Patient patient he had a "stomach bug Tuesday" with nausea vomiting and diarrhea. Vomiting and diarrhea has now resolved. He has fallen x 5 episodes since last evening. Wife called EMS this morning to help her pick patient up and put him in the vehicle to get to this apt. He denies any headaches. He reports dizziness when standing.

## 2023-01-20 NOTE — ED Notes (Signed)
[name]&Ox4. Patient RR normal and unlabored resting in bed watching television. Vitals currently stable. Pt complaining of minimal back pain intermittently a 1/10 from recent falls. Otherwise no complaints at this time.

## 2023-01-20 NOTE — Assessment & Plan Note (Signed)
Currently hypotensive. -Holding home propranolol and olmesartan

## 2023-01-20 NOTE — Assessment & Plan Note (Addendum)
Most likely secondary dehydration with recent GI illness and keep taking his blood pressure medications.  Patient normally gets IV fluid twice weekly at cancer center, missed on Tuesday due to vomiting and diarrhea which has been resolved now. Blood pressure started improving with IV fluid. Patient did not met sepsis criteria and there is no other obvious source of infection. Recently he completed a course of antibiotics for concern of pneumonia which was given to him by his pulmonologist. No worsening of his baseline cough or other symptoms. Remained on room air. Received broad-spectrum antibiotics and IV fluid for initial concern of sepsis which is ruled out at this time. I will not continue antibiotics for now. -Admit to MedSurg -Continue with IV fluid -Holding home antihypertensives -Follow-up cultures result which were drawn in ED

## 2023-01-20 NOTE — Progress Notes (Signed)
Symptom Management Clinic [facility]  [phone] [phone]  Patient Care Team: [name], MD as PCP - General (Family Medicine) [name], RN as Oncology Nurse Navigator Orson Ape, MD as Consulting Physician (Urology) [name], MD as Consulting Physician (Oncology) [name], MD as Consulting Physician (Pulmonary Disease)   Name of the patient: Rodney Vega  [id number]  [birth year]   Date of visit: 01/20/23  Reason for Consult: Rodney Vega is a 67 year old man with multiple medical problems including recurrent locally advanced non-small cell lung cancer status post chemotherapy and radiation.  Patient currently on immunotherapy.  Interval history: Patient received cycle 6 Keytruda on 01/04/2023.  He has been persistently weak with poor oral intake.  Patient was scheduled for IV fluids today but was an add-on to [facility] due to recurrent falls.  Patient seen in fluid clinic.  Patient reports that he had several falls this morning associated with dizziness when he attempted to stand.  He reports minimal oral intake with eating and drinking.  He does think that he had a "GI bug" earlier this week with about 24 hours of nausea and diarrhea but that has since resolved.  Patient was noted to be orthostatic with initial systolic pressure in 70s.  Denies any neurologic complaints. Denies any easy bleeding or bruising.  No fever or chills. Denies chest pain. Denies any nausea, vomiting, constipation, or diarrhea.  Patient offers no further specific complaints today.  PAST MEDICAL HISTORY: Past Medical History:  Diagnosis Date   Acute on chronic respiratory failure    Anemia    Cancer    Colostomy present    COPD (chronic obstructive pulmonary disease)    Degenerative arthritis of knee    Depression    Diabetes mellitus without complication    Diverticulitis    Dyspnea    Glaucoma    since 2004   Hernia 1979    History of kidney stones    History of methicillin resistant staphylococcus aureus (MRSA)    Hypertension    Hypokalemia    Marijuana use    Mass of cecum    Nephrolithiasis    Neuromuscular disorder    Personal history of tobacco use, presenting hazards to health    Pneumonia    Port-A-Cath in place    Pre-diabetes    Primary cancer of right upper lobe of lung    SCC of lung (small cell carcinoma)    Screening for obesity    Severe sepsis    Special screening for malignant neoplasms, colon    Tobacco use    Wears dentures    full upper and lower    PAST SURGICAL HISTORY:  Past Surgical History:  Procedure Laterality Date   BACK SURGERY  1994, 2012   lumbar bulging disc   BRONCHOSCOPY  05/05/2022   CATARACT EXTRACTION W/PHACO Left 01/20/2021   Procedure: CATARACT EXTRACTION PHACO AND INTRAOCULAR LENS PLACEMENT (IOC) LEFT;  Surgeon: Galen Manila, MD;  Location: [facility];  Service: Ophthalmology;  Laterality: Left;  10.13 0:52.1   COLONOSCOPY  [id number]   [facility]/Dr. Carmell Austria sessile polyp,38mm, found in rectum,multiple diverticula were found in the sigmoid, in descending and in transverse colon   COLONOSCOPY WITH PROPOFOL N/A 10/22/2021   Procedure: COLONOSCOPY WITH PROPOFOL;  Surgeon: Jaynie Collins, DO;  Location: [facility] END[facility]COPY;  Service: Gastroenterology;  Laterality: N/A;  DM   COLOSTOMY  04/20/2013   EYE SURGERY     HERNIA REPAIR  1979  inguinal   IR IMAGING GUIDED PORT INSERTION  06/10/2021   JOINT REPLACEMENT Right    knee   KNEE ARTHROPLASTY Right 05/08/2018   Procedure: COMPUTER ASSISTED TOTAL KNEE ARTHROPLASTY;  Surgeon: Donato Heinz, MD;  Location: [facility];  Service: Orthopedics;  Laterality: Right;   KNEE ARTHROPLASTY Left 11/16/2019   Procedure: COMPUTER ASSISTED TOTAL KNEE ARTHROPLASTY;  Surgeon: Donato Heinz, MD;  Location: [facility];  Service: Orthopedics;  Laterality: Left;   LAPAROTOMY  04/20/2013   colon resection with  colosotmy   LITHOTRIPSY  2013   LUNG BIOPSY  2023   RIGID BRONCHOSCOPY N/A 10/29/2022   Procedure: RIGID BRONCHOSCOPY;  Surgeon: [name], MD;  Location: [facility];  Service: Thoracic;  Laterality: N/A;   TONSILLECTOMY AND ADENOIDECTOMY  1962   VIDEO BRONCHOSCOPY WITH ENDOBRONCHIAL NAVIGATION N/A 05/05/2022   Procedure: VIDEO BRONCHOSCOPY WITH ENDOBRONCHIAL NAVIGATION;  Surgeon: [name], MD;  Location: [facility];  Service: Thoracic;  Laterality: N/A;   VIDEO BRONCHOSCOPY WITH ENDOBRONCHIAL ULTRASOUND N/A 05/05/2022   Procedure: VIDEO BRONCHOSCOPY WITH ENDOBRONCHIAL ULTRASOUND;  Surgeon: [name], MD;  Location: [facility];  Service: Thoracic;  Laterality: N/A;   VIDEO BRONCHOSCOPY WITH ENDOBRONCHIAL ULTRASOUND N/A 10/29/2022   Procedure: VIDEO BRONCHOSCOPY WITH ENDOBRONCHIAL ULTRASOUND;  Surgeon: [name], MD;  Location: [facility];  Service: Thoracic;  Laterality: N/A;    HEMATOLOGY/ONCOLOGY HISTORY:  Oncology History Overview Note  AUG 2022- 8.2 x 5.9 cm spiculated mass noted in right upper lobe consistent with malignancy. It extends from the right hilum to the lateral chest wall and results in lytic destruction of the lateral portion of the right fourth rib.  Mediastinal lymph nodes are noted, including 12 mm right hilar lymph node, consistent with metastatic disease. 3 cm right adrenal mass is noted concerning for metastatic disease.  # AUG 2022- PET IMPRESSION: Peripherally hypermetabolic right upper lobe mass, likely due to central necrosis. Mass extends into the right hilum and right lateral chest wall involving the second through fourth right ribs.   Mildly enlarged and hypermetabolic right hilar lymph node.   No evidence of metastatic disease in the abdomen or pelvis.  # AUG, 2022-RIGHT CHEST WALL Bx-necrosis; suspicious for malignancy not definitive [discussed with Dr.Kraynie;MDT] LUNG MASS, RIGHT; BIOPSY:  - SUSPICIOUS FOR MALIGNANCY.  - SEE COMMENT.    Comment:  Approximately six tissue cores demonstrate inflamed fibrous capsule with  hemosiderin-laden macrophages, in a background of abundant necrosis.  One of the cores demonstrates a minute fragment of viable epithelial  cells.  These cells are positive for p40.  They are negative for TTF1  and CK7. The cells of interest disappear on deeper cut levels.  While  the findings are suspicious for squamous cell carcinoma, there is not  enough viable tumor for a definitive diagnosis.  Additional tissue  sampling may be helpful.   # SEP 2022-cycle #2 Taxol hypersensitive reaction.  Switched over to #3 cycle- carbo-Abraxane starting 06/29/2021.  Discontinued IMFINZI therapy-December 2022 given poor tolerance/pneumonia admission to hospital.  #  [name] was in May-June 2023 x3 doses; stopped because of intolerance-fatigue patient preference/lack of progression.  # JAN 2024-bronchoscopy biopsy negative for malignancy; however PET scan concerning for recurrence/progression.  # FEB 15t, 2024- RE-Start Keytruda.   # DEC 2022- s/p ID- Dr.[email or web address]- ? Lung abscess-no antibiotics-  JAN 2023- CT scan incidental- cecal mass- colonoscopy[Dr.Russo; [facility]- high grade dysplasia]  S/p evaluation with Dr. Sherryle Lis surgery for now]   # S/p evaluation with Dr. Sheppard Penton.  Primary cancer of right upper lobe of lung  06/03/2021 Initial Diagnosis   Primary cancer of right upper lobe of lung (HCC)   06/03/2021 Cancer Staging   Staging form: Lung, AJCC 8th Edition - Clinical: Stage IIIB (cT3, cN2, cM0) - Signed by [name], MD on 06/03/2021   06/15/2021 - 08/03/2021 Chemotherapy   Patient is on Treatment Plan : LUNG Carboplatin / Paclitaxel + XRT q7d     10/02/2021 - 10/19/2021 Chemotherapy   Patient is on Treatment Plan : LUNG Durvalumab q14d     02/03/2022 -  Chemotherapy   Patient is on Treatment Plan : LUNG NSCLC Pembrolizumab (200) q21d     02/04/2022 - 03/18/2022 Chemotherapy    Patient is on Treatment Plan : LUNG NSCLC Pembrolizumab (200) q21d       ALLERGIES:  is allergic to paclitaxel, ambien [zolpidem], nsaids, and aleve [naproxen sodium].  MEDICATIONS:  Current Outpatient Medications  Medication Sig Dispense Refill   Accu-Chek Softclix Lancets lancets USE AS DIRECTED FOUR TIMES DAILY 100 each 12   acetaminophen (TYLENOL) 500 MG tablet Take 2 tablets (1,000 mg total) by mouth daily as needed. Home med. 30 tablet 0   albuterol (PROVENTIL) (2.5 MG/3ML) 0.083% nebulizer solution Take 2.5 mg by nebulization every 4 (four) hours as needed for wheezing or shortness of breath.     albuterol (VENTOLIN HFA) 108 (90 Base) MCG/ACT inhaler SMARTSIG:2 inhalation Via Inhaler Every 6 Hours PRN (Patient taking differently: 1-2 puffs every 6 (six) hours as needed. SMARTSIG:2 inhalation Via Inhaler Every 6 Hours PRN) 1 each 2   blood glucose meter kit and supplies KIT Check your blood glucose levels twice a day; once in the morning before breakfast; and once in the evening after dinner. 1 each 0   BREZTRI AEROSPHERE 160-9-4.8 MCG/ACT AERO Inhale 2 puffs into the lungs 2 (two) times daily.     clobetasol cream (TEMOVATE) 0.05 % Apply 1 Application topically 2 (two) times daily. 30 g 2   dorzolamide-timolol (COSOPT) 22.3-6.8 MG/ML ophthalmic solution Place 1 drop into both eyes 2 (two) times daily.     eszopiclone (LUNESTA) 1 MG TABS tablet TAKE 1 TABLET(1 MG) BY MOUTH AT BEDTIME AS NEEDED FOR SLEEP 30 tablet 1   fentaNYL (DURAGESIC) 25 MCG/HR Place 1 patch onto the skin every 3 (three) days. 10 patch 0   glipiZIDE (GLUCOTROL) 5 MG tablet TAKE 1 TABLET(5 MG) BY MOUTH DAILY BEFORE BREAKFAST 90 tablet 2   glucose blood test strip Check your blood glucose levels twice a day; once in the morning before breakfast; and once in the evening after dinner. 100 each 12   ipratropium-albuterol (DUONEB) 0.5-2.5 (3) MG/3ML SOLN Take 3 mLs by nebulization every 6 (six) hours as needed. 360 mL 1    latanoprost (XALATAN) 0.005 % ophthalmic solution Place 1 drop into both eyes at bedtime.     lidocaine-prilocaine (EMLA) cream Apply 1 Application topically as needed. 30 g 3   metFORMIN (GLUCOPHAGE) 500 MG tablet Take 1 tablet (500 mg total) by mouth 2 (two) times daily with a meal. 60 tablet 2   montelukast (SINGULAIR) 10 MG tablet TAKE 1 TABLET(10 MG) BY MOUTH AT BEDTIME (Patient taking differently: Take 10 mg by mouth at bedtime. TAKE 1 TABLET(10 MG) BY MOUTH AT BEDTIME) 90 tablet 0   Multiple Vitamin (MULTIVITAMIN WITH MINERALS) TABS tablet Take 1 tablet by mouth daily.     olmesartan (BENICAR) 40 MG tablet Take 1 tablet (40 mg total) by mouth  every morning. 30 tablet 3   oxyCODONE (OXY IR/ROXICODONE) 5 MG immediate release tablet Take 1 tablet (5 mg total) by mouth every 8 (eight) hours as needed for severe pain. 90 tablet 0   pregabalin (LYRICA) 50 MG capsule Take 1 capsule (50 mg total) by mouth 2 (two) times daily. 60 capsule 2   prochlorperazine (COMPAZINE) 10 MG tablet Take 10 mg by mouth every 6 (six) hours as needed for nausea or vomiting.     Respiratory Therapy Supplies (NEBULIZER) DEVI Use as directed 1 each 0   tamsulosin (FLOMAX) 0.4 MG CAPS capsule Take 0.4 mg by mouth daily after supper.     triamcinolone ointment (KENALOG) 0.5 % Apply 1 Application topically 2 (two) times daily. 30 g 1   venlafaxine XR (EFFEXOR-XR) 75 MG 24 hr capsule Take 225 mg by mouth daily with breakfast.     vitamin B-12 (CYANOCOBALAMIN) 500 MCG tablet Take 1 tablet by mouth daily.     Vitamin D, Ergocalciferol, (DRISDOL) 1.25 MG (50000 UNIT) CAPS capsule TAKE 1 CAPSULE BY MOUTH 1 TIME A WEEK 12 capsule 1   chlorpheniramine-HYDROcodone (TUSSIONEX) 10-8 MG/5ML Take 5 mLs by mouth at bedtime as needed for cough. (Patient not taking: Reported on 12/09/2022) 140 mL 0   dextromethorphan-guaiFENesin (MUCINEX DM) 30-600 MG 12hr tablet Take 1 tablet by mouth 2 (two) times daily as needed for cough. (Patient not  taking: Reported on 01/20/2023)     guaiFENesin-codeine 100-10 MG/5ML syrup Take 5 mLs by mouth daily as needed for cough. (Patient not taking: Reported on 12/09/2022) 120 mL 0   potassium chloride 20 MEQ/15ML (10%) SOLN Take 15 mLs (20 mEq total) by mouth 2 (two) times daily. (Patient not taking: Reported on 12/09/2022) 473 mL 2   propranolol ER (INDERAL LA) 60 MG 24 hr capsule TAKE 1 CAPSULE(60 MG) BY MOUTH DAILY (Patient taking differently: 60 mg every morning.) 30 capsule 3   No current facility-administered medications for this visit.   Facility-Administered Medications Ordered in Other Visits  Medication Dose Route Frequency Provider Last Rate Last Admin   0.9 %  sodium chloride infusion   Intravenous Continuous [place] Kretz, Daryl Eastern, NP 500 mL/hr at 01/20/23 1201 New Bag at 01/20/23 1201   heparin lock flush 100 UNIT/ML injection            heparin lock flush 100 UNIT/ML injection            heparin lock flush 100 UNIT/ML injection            heparin lock flush 100 unit/mL  500 Units Intracatheter Once PRN [name], MD        VITAL SIGNS: BP (!) 76/51 Comment: sitting  Pulse 89   Temp (!) 95.6 F (35.3 C) (Tympanic)   Resp 20  There were no vitals filed for this visit.   Estimated body mass index is 22.92 kg/m as calculated from the following:   Height as of 12/07/22:  (1.803 m).   Weight as of 01/13/23: 164 lb 5 oz (74.5 kg).  LABS: CBC:    Component Value Date/Time   WBC 10.0 01/20/2023 1044   HGB 12.1 (L) 01/20/2023 1044   HGB 7.5 (L) 12/01/2021 0504   HCT 37.2 (L) 01/20/2023 1044   HCT 23.7 (L) 12/01/2021 0504   PLT 242 01/20/2023 1044   PLT 243 12/01/2021 0504   MCV 86.5 01/20/2023 1044   MCV 89 12/01/2021 0504   MCV 91 09/21/2013 0522  NEUTROABS 7.8 (H) 01/20/2023 1044   NEUTROABS 9.6 (H) 12/01/2021 0504   NEUTROABS 11.3 (H) 09/21/2013 0522   LYMPHSABS 1.2 01/20/2023 1044   LYMPHSABS 0.8 12/01/2021 0504   LYMPHSABS 1.4 09/21/2013 0522   MONOABS  0.7 01/20/2023 1044   MONOABS 0.9 09/21/2013 0522   EOSABS 0.1 01/20/2023 1044   EOSABS 0.3 12/01/2021 0504   EOSABS 0.0 09/21/2013 0522   BASOSABS 0.0 01/20/2023 1044   BASOSABS 0.0 12/01/2021 0504   BASOSABS 0.1 09/21/2013 0522   Comprehensive Metabolic Panel:    Component Value Date/Time   NA 135 01/20/2023 1044   NA 138 09/20/2013 0639   K 3.9 01/20/2023 1044   K 4.0 09/20/2013 0639   CL 100 01/20/2023 1044   CL 107 09/20/2013 0639   CO2 27 01/20/2023 1044   CO2 26 09/20/2013 0639   BUN 40 (H) 01/20/2023 1044   BUN 12 09/20/2013 0639   CREATININE 1.73 (H) 01/20/2023 1044   CREATININE 0.84 09/24/2013 0515   GLUCOSE 113 (H) 01/20/2023 1044   GLUCOSE 160 (H) 09/20/2013 0639   CALCIUM 8.9 01/20/2023 1044   CALCIUM 8.2 (L) 09/20/2013 0639   AST 14 (L) 01/20/2023 1044   ALT 7 01/20/2023 1044   ALT 20 01/08/2012 0939   ALKPHOS 90 01/20/2023 1044   ALKPHOS 69 01/08/2012 0939   BILITOT 0.5 01/20/2023 1044   PROT 7.1 01/20/2023 1044   PROT 6.5 09/12/2013 1002   PROT 6.9 01/08/2012 0939   ALBUMIN 3.6 01/20/2023 1044   ALBUMIN 4.4 09/12/2013 1002   ALBUMIN 3.5 01/08/2012 0939    RADIOGRAPHIC STUDIES: CT CHEST ABDOMEN PELVIS W CONTRAST  Result Date: 01/17/2023 CLINICAL DATA:  Non-small-cell lung cancer. Assess treatment response. Chemotherapy and radiation. * Tracking Code: BO * EXAM: CT CHEST, ABDOMEN, AND PELVIS WITH CONTRAST TECHNIQUE: Multidetector CT imaging of the chest, abdomen and pelvis was performed following the standard protocol during bolus administration of intravenous contrast. RADIATION DOSE REDUCTION: This exam was performed according to the departmental dose-optimization program which includes automated exposure control, adjustment of the mA and/or kV according to patient size and/or use of iterative reconstruction technique. CONTRAST:  OMNIPAQUE IOHEXOL 300 MG/ML  SOLN COMPARISON:  PET-CT 09/22/2022.  Standard CT 07/06/2022 FINDINGS: CT CHEST FINDINGS  Cardiovascular: Left upper chest port. Normal caliber thoracic aorta with some vascular calcifications. Coronary artery calcifications are seen. No significant pericardial effusion. The heart is nonenlarged. Mediastinum/Nodes: Preserved esophagus. Stable thyroid gland. No specific abnormal lymph node enlargement present in the axillary regions or hilum. There are some small less than 1 cm in size in short axis mediastinal nodes, not pathologic by size criteria. The exception was in the left hilum. Previously there was a hypermetabolic node in this location. On the prior CT scan this would have measured in retrospect 2.7 x 1.3 cm. Today residual tissue in this location measures 11 x 11 mm on series 2, image 27. Previously this may have been 2 adjacent nodes. Lungs/Pleura: Left lung has some paraseptal and centrilobular emphysematous changes. Chronic interstitial changes are seen as well. There is some subtle areas of ground-glass type nodular areas. This has increased along the posteroinferior aspect of the left upper lobe on series 2, image 27 with area measuring a proximally 18 x 12 mm. Additional 5 mm area posterior left lower lobe on series 2, image 40. Previously there were similar areas in the inferior aspect of the left lower lobe which have improved. In addition the nodular area of opacity in  the medial left upper lobe on the prior has shown some improvement as well. The right lung has areas of interstitial septal thickening particularly towards the upper lung zones with volume loss and air bronchograms with distortion. There is also a cavitary area in the right lung apex which appears to communicate with central bronchi on axial image 21 of series 2 with nodular pleural thickening. On PET-CT this area was hypermetabolic. Thickness of the area laterally on series 2, image 20 today measures a proximally 2.6 cm. Going back to the prior exam when measured in a similar fashion this measured 2.5 cm. The adjacent  portion of the right third and fourth ribs have rib destruction. There is also a fracture of the fifth rib laterally which was seen previously. Musculoskeletal: The pleural lesion in the right upper lobe does extend through to the adjacent ribs and chest wall laterally with some stranding with thickening. Degenerative changes along the spine. CT ABDOMEN PELVIS FINDINGS Hepatobiliary: Fatty liver infiltration. No space-occupying liver lesion. The gallbladder is nondilated. Patent portal vein. Pancreas: Unremarkable. No pancreatic ductal dilatation or surrounding inflammatory changes. Spleen: Normal in size without focal abnormality. Adrenals/Urinary Tract: Right adrenal nodule identified once again measuring 2.7 cm in maximal dimension, unchanged. Previously this is felt to be an adenoma on prior workup. There is also some focal thickening of the left adrenal gland which is stable as well. No enhancing renal mass. Extrarenal pelvis seen of the left kidney with multiple stones. Component of chronic UPJ. Preserved contours of the urinary bladder. Stomach/Bowel: Stomach is distended with contrast and debris. Small bowel is nondilated. There is a left lower quadrant colostomy. Short rectosigmoid stump. Proximal colon has scattered stool. There is a possible mass seen at the base of the cecum with a dilated appendix. This was seen previously. The level of appendiceal dilatation is slightly increasing. Please correlate for colon carcinoma with a obstructing component to the appendix. Vascular/Lymphatic: Normal caliber IVC. Scattered vascular calcifications. There are areas of stenosis suggested including at the celiac axis and SMA. Focal saccular dilatation of the infrarenal abdominal aorta with diameter including the sacculation of up to 2.7 cm. Reproductive: Prostate is unremarkable. Other: No free air or free fluid. Musculoskeletal: Curvature of the spine with some degenerative changes. IMPRESSION: Once again there is a  large right apical cavitary area with nodular wall thickening. Extension through the chest wall laterally with erosion of adjacent ribs and pathologic fracture. The cavitary lesion does appear to communicate with central bronchi. Changing patchy ground-glass ill-defined lung opacities. Recommend continued follow-up. Underlying emphysematous lung changes. Decreasing hypermetabolic left hilar lymph nodes. No new lymph node enlargement. Fatty liver infiltration. Once again there is a mass lesion identified along the cecum obstructing the orifice of the appendix. This is worrisome for a neoplasm. Again recommend further workup when clinically appropriate. UPJ changes of the left kidney with multiple nonobstructing renal stones. Adrenal adenomas. 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 Electronically Signed   By: Karen Kays M.D.   On: 01/17/2023 17:55    PERFORMANCE STATUS (ECOG) : 1 - Symptomatic but completely ambulatory  Review of Systems Unless otherwise noted, a complete review of systems is negative.  Physical Exam General: NAD Cardiac: RRR Pulmonary: Chronically coarse to auscultation anterior/posterior fields, expiratory wheezing Abdomen: Soft, nontender to palp Extremities: no edema, no joint deformities Skin: no rashes Neurological: Grossly nonfocal  Assessment and Plan: Unresectable/locally advanced non-small cell lung cancer -currently on treatment with Keytruda.  CT of  the chest abdomen and pelvis on 01/14/2023 showed stable right upper lobe mass with slight improvement in left hilar lymphadenopathy.   Hypotension-likely secondary to dehydration with recent minimal oral intake.  AKI on labs. Patient appears to be orthostatic. Blood pressure did not significantly improve after 1.5 L normal saline.  Patient nontoxic-appearing but discussed with Dr. Donneta Romberg who advised transfer to the emergency department for further evaluation and management.  Patient  transferred to the ED.  Case and plan discussed with Dr. Donneta Romberg.  Report called to ED triage  Patient expressed understanding and was in agreement with this plan. He also understands that He can call clinic at any time with any questions, concerns, or complaints.   Thank you for allowing me to participate in the care of this very pleasant patient.   Time Total: 25 minutes  Visit consisted of counseling and education dealing with the complex and emotionally intense issues of symptom management in the setting of serious illness.Greater than 50%  of this time was spent counseling and coordinating care related to the above assessment and plan.  Signed by: Laurette Schimke, PhD, NP-C

## 2023-01-20 NOTE — Assessment & Plan Note (Signed)
Colostomy care 

## 2023-01-20 NOTE — Assessment & Plan Note (Signed)
Seems well-controlled.  On glipizide at home -SSI

## 2023-01-20 NOTE — Assessment & Plan Note (Signed)
Creatinine of 1.7 initially with baseline less than 1.  More consistent with dehydration. -Monitor renal function with IV fluid -Avoid nephrotoxins

## 2023-01-20 NOTE — Assessment & Plan Note (Signed)
-   Continue home medications 

## 2023-01-20 NOTE — ED Provider Notes (Signed)
[facility] Provider Note    Event Date/Time   First MD Initiated Contact with Patient 01/20/23 1404     (approximate)   History   Fall   HPI  Rodney Vega is a 67 y.o. male with a history of diabetes small cell carcinoma of the lung sent from oncology because of possible dehydration, weakness and hypotension.  Patient reports that he has had a GI illness this week, missed his earlier appointment in the week for IV fluids, was getting IV fluids today but continued hypotensive, sent for evaluation     Physical Exam   Triage Vital Signs: ED Triage Vitals  Enc Vitals Group     BP 01/20/23 1417 95/61     Pulse Rate 01/20/23 1409 71     Resp 01/20/23 1423 17     Temp 01/20/23 1423 97.8 F (36.6 C)     Temp Source 01/20/23 1423 Oral     SpO2 01/20/23 1409 100 %     Weight 01/20/23 1344 74.4 kg (164 lb)     Height 01/20/23 1344 1.803 m (5\' 11" )     Head Circumference --      Peak Flow --      Pain Score 01/20/23 1343 2     Pain Loc --      Pain Edu? --      Excl. in GC? --     Most recent vital signs: Vitals:   01/20/23 1430 01/20/23 1527  BP: (!) 88/57 (!) 92/55  Pulse: 72 72  Resp: 14 20  Temp:  97.8 F (36.6 C)  SpO2: 96% 96%     General: Awake, no distress.  CV:  Good peripheral perfusion.  Resp:  Normal effort.  No wheezing or rails Abd:  No distention.  Soft, nontender, reassuring exam Other:     ED Results / Procedures / Treatments   Labs (all labs ordered are listed, but only abnormal results are displayed) Labs Reviewed  BASIC METABOLIC PANEL - Abnormal; Notable for the following components:      Result Value   Glucose, Bld 173 (*)    BUN 34 (*)    Creatinine, Ser 1.43 (*)    Calcium 8.1 (*)    GFR, Estimated 54 (*)    All other components within normal limits  CBC - Abnormal; Notable for the following components:   RBC 3.85 (*)    Hemoglobin 10.9 (*)    HCT 34.6 (*)    All other components within normal limits   LACTIC ACID, PLASMA - Abnormal; Notable for the following components:   Lactic Acid, Venous 2.5 (*)    All other components within normal limits  CULTURE, BLOOD (ROUTINE X 2)  CULTURE, BLOOD (ROUTINE X 2)  URINALYSIS, ROUTINE W REFLEX MICROSCOPIC  LACTIC ACID, PLASMA  PROCALCITONIN     EKG ED ECG REPORT I, Jene Every, the attending physician, personally viewed and interpreted this ECG.  Date: 01/20/2023  Rhythm: normal sinus rhythm QRS Axis: normal Intervals: normal ST/T Wave abnormalities: normal Narrative Interpretation: no evidence of acute ischemia    RADIOLOGY Chest x-ray without evidence of pneumonia    PROCEDURES:  Critical Care performed: yes  CRITICAL CARE Performed by: Jene Every   Total critical care time: 30 minutes  Critical care time was exclusive of separately billable procedures and treating other patients.  Critical care was necessary to treat or prevent imminent or life-threatening deterioration.  Critical care was time spent personally by  me on the following activities: development of treatment plan with patient and/or surrogate as well as nursing, discussions with consultants, evaluation of patient's response to treatment, examination of patient, obtaining history from patient or surrogate, ordering and performing treatments and interventions, ordering and review of laboratory studies, ordering and review of radiographic studies, pulse oximetry and re-evaluation of patient's condition.   Procedures   MEDICATIONS ORDERED IN ED: Medications  sodium chloride 0.9 % bolus 1,000 mL (0 mLs Intravenous Hold 01/20/23 1528)  ceFEPIme (MAXIPIME) 2 g in sodium chloride 0.9 % 100 mL IVPB (2 g Intravenous New Bag/Given 01/20/23 1518)  metroNIDAZOLE (FLAGYL) IVPB 500 mg (0 mg Intravenous Hold 01/20/23 1528)  vancomycin (VANCOCIN) IVPB 1000 mg/200 mL premix (0 mg Intravenous Hold 01/20/23 1529)  0.9 %  sodium chloride infusion ( Intravenous New  Bag/Given 01/20/23 1422)     IMPRESSION / MDM / ASSESSMENT AND PLAN / ED COURSE  I reviewed the triage vital signs and the nursing notes. Patient's presentation is most consistent with acute presentation with potential threat to life or bodily function.  Patient with immunosuppression, small cell lung carcinoma presents with weakness.  Given HPI, differential includes dehydration secondary to GI illness, possible infection  Mildly hypertensive upon arrival, will give additional fluids, he received 1.5 L of IV fluids at oncology, will give an additional liter here now.  White blood cell count and BMP demonstrate mild AKI  Notified of elevated lactic acid of 2.5, given continued hypotension will give additional liter of fluids to complete 30 mL/kg, will cover broadly with IV antibiotics although no clear source of antibiotics and I still suspect this is related to dehydration.  I discussed with the hospitalist for admission    Clinical Course as of 01/20/23 1531  Thu Jan 20, 2023  1527 DG Chest [place] 1 View [RK]    Clinical Course User Index [RK] Jene Every, MD     FINAL CLINICAL IMPRESSION(S) / ED DIAGNOSES   Final diagnoses:  Dehydration     Rx / DC Orders   ED Discharge Orders     None        Note:  This document was prepared using Dragon voice recognition software and may include unintentional dictation errors.   Jene Every, MD 01/20/23 [id number]

## 2023-01-20 NOTE — Assessment & Plan Note (Signed)
No concern of exacerbation at this time. -Continue home bronchodilators

## 2023-01-21 DIAGNOSIS — E861 Hypovolemia: Secondary | ICD-10-CM | POA: Diagnosis not present

## 2023-01-21 LAB — CBC
HCT: 31.4 % — ABNORMAL LOW (ref 39.0–52.0)
Hemoglobin: 9.8 g/dL — ABNORMAL LOW (ref 13.0–17.0)
MCH: 27.5 pg (ref 26.0–34.0)
MCHC: 31.2 g/dL (ref 30.0–36.0)
MCV: 88.2 fL (ref 80.0–100.0)
Platelets: 148 10*3/uL — ABNORMAL LOW (ref 150–400)
RBC: 3.56 MIL/uL — ABNORMAL LOW (ref 4.22–5.81)
RDW: 14 % (ref 11.5–15.5)
WBC: 4.7 10*3/uL (ref 4.0–10.5)
nRBC: 0 % (ref 0.0–0.2)

## 2023-01-21 LAB — HEMOGLOBIN A1C
Hgb A1c MFr Bld: 5.1 % (ref 4.8–5.6)
Mean Plasma Glucose: 99.67 mg/dL

## 2023-01-21 LAB — BASIC METABOLIC PANEL
Anion gap: 4 — ABNORMAL LOW (ref 5–15)
BUN: 21 mg/dL (ref 8–23)
CO2: 26 mmol/L (ref 22–32)
Calcium: 8.2 mg/dL — ABNORMAL LOW (ref 8.9–10.3)
Chloride: 109 mmol/L (ref 98–111)
Creatinine, Ser: 1.01 mg/dL (ref 0.61–1.24)
GFR, Estimated: >60 mL/min (ref >60)
Glucose, Bld: 93 mg/dL (ref 70–99)
Potassium: 4 mmol/L (ref 3.5–5.1)
Sodium: 139 mmol/L (ref 135–145)

## 2023-01-21 LAB — CULTURE, BLOOD (ROUTINE X 2)

## 2023-01-21 LAB — GLUCOSE, CAPILLARY
Glucose-Capillary: 75 mg/dL (ref 70–99)
Glucose-Capillary: 111 mg/dL — ABNORMAL HIGH (ref 70–99)

## 2023-01-21 MED ORDER — CHLORHEXIDINE GLUCONATE CLOTH 2 % EX PADS
6.0000 | MEDICATED_PAD | Freq: Every day | CUTANEOUS | Status: DC
  Administered 2023-01-21: 6 via TOPICAL

## 2023-01-21 NOTE — Discharge Summary (Signed)
Physician Discharge Summary   Patient: Rodney Vega MRN: [id number] DOB: 1956/09/04  Admit date:     01/20/2023  Discharge date: 01/21/23  Discharge Physician: Arnetha Courser   PCP: [name], MD   Recommendations at discharge:  Please obtain CBC and BMP within a week Follow-up at cancer center Follow-up with primary care provider  Discharge Diagnoses: Principal Problem:   Hypotension Active Problems:   Fall   AKI (acute kidney injury)   Diabetes mellitus without complication   Hypertension   Malnutrition of moderate degree   Primary cancer of right upper lobe of lung   Colostomy in place   Depression   COPD (chronic obstructive pulmonary disease)   Hospital Course: Taken from H&P   Rodney Vega is a 67 y.o. male with medical history significant of type 2 diabetes mellitus, locally advanced lung cancer on immunotherapy, depression, COPD and hypertension was brought to ED with concern of fall and significant hypotension.   Patient was apparently sick with stomach bug on Tuesday which lasted for 24 hours.  Symptoms resolved.  He normally gets IV fluid at cancer center twice weekly, missed his Tuesday appointment.  He was feeling very weak and had couple of falls earlier in the morning.  He went to cancer center and found to be very hypotensive which did not respond initially to IV fluid so he was sent to ED for further evaluation.  ED course.  On presentation patient has softer blood pressure at 76/51, afebrile.  No leukocytosis, BMP with AKI and creatinine at 1.73 with baseline less than 1.  Lactic acid of 2.5.   Initially code sepsis was called.  Received IV fluid and broad-spectrum antibiotics.  Lactic acidosis most likely secondary to dehydration.  No other obvious source of infection so sepsis ruled out and antibiotics will not be continued at this time.  Blood cultures were drawn from ED.  4/19: Hemodynamically stable with normal vitals.  Labs with resolution of AKI.   Preliminary blood cultures negative.  Procalcitonin negative and lactic acidosis resolved. Patient was able to work with PT and they were recommending home health which he declined.  Patient to have positive orthostatic vitals when sitting to standing but remained asymptomatic and able to walk around the nursing station without any difficulty.  Patient will continue on current medications and was advised to keep up with his hydration. He will continue taking IV fluids at cancer center as he was doing before.  Patient will follow-up with his providers for further recommendations.    Assessment and Plan: * Hypotension Most likely secondary dehydration with recent GI illness and keep taking his blood pressure medications.  Patient normally gets IV fluid twice weekly at cancer center, missed on Tuesday due to vomiting and diarrhea which has been resolved now. Blood pressure started improving with IV fluid. Patient did not met sepsis criteria and there is no other obvious source of infection. Recently he completed a course of antibiotics for concern of pneumonia which was given to him by his pulmonologist. No worsening of his baseline cough or other symptoms. Remained on room air. Received broad-spectrum antibiotics and IV fluid for initial concern of sepsis which is ruled out at this time. I will not continue antibiotics for now. -Admit to MedSurg -Continue with IV fluid -Holding home antihypertensives -Follow-up cultures result which were drawn in ED  AKI (acute kidney injury) Creatinine of 1.7 initially with baseline less than 1.  More consistent with dehydration. -Monitor renal function with IV fluid -  Avoid nephrotoxins  Fall Patient had few falls this morning and was complaining of some lower back pain.  Most likely secondary to orthostatic hypotension with GI losses.  No head injury.  No other significant injury noted. -Will check lower back imaging -PT evaluation  Diabetes  mellitus without complication Seems well-controlled.  On glipizide at home -SSI  Hypertension Currently hypotensive. -Holding home propranolol and olmesartan  Malnutrition of moderate degree Secondary to malignancy. -Dietitian consult  Primary cancer of right upper lobe of lung Patient with locally advanced non-small cell lung cancer which is being treated with immunotherapy. -Continue with outpatient management  Colostomy in place -Colostomy care  COPD (chronic obstructive pulmonary disease) No concern of exacerbation at this time. -Continue home bronchodilators  Depression Continue home medications.   Consultants: None Procedures performed: None Disposition: Home Diet recommendation:  Discharge Diet Orders (From admission, onward)     Start     Ordered   01/21/23 0000  Diet - low sodium heart healthy        01/21/23 1421           Cardiac and Carb modified diet DISCHARGE MEDICATION: Allergies as of 01/21/2023       Reactions   Paclitaxel Shortness Of Breath   Ambien [zolpidem]    Causes confusion    Nsaids    Abdominal pain   Aleve [naproxen Sodium] Itching        Medication List     STOP taking these medications    chlorpheniramine-HYDROcodone 10-8 MG/5ML Commonly known as: TUSSIONEX   clobetasol cream 0.05 % Commonly known as: TEMOVATE   guaiFENesin-codeine 100-10 MG/5ML syrup   potassium chloride 20 MEQ/15ML (10%) Soln   Vitamin D (Ergocalciferol) 1.25 MG (50000 UNIT) Caps capsule Commonly known as: DRISDOL       TAKE these medications    Accu-Chek Softclix Lancets lancets USE AS DIRECTED FOUR TIMES DAILY   acetaminophen 500 MG tablet Commonly known as: TYLENOL Take 2 tablets (1,000 mg total) by mouth daily as needed. Home med.   albuterol (2.5 MG/3ML) 0.083% nebulizer solution Commonly known as: PROVENTIL Take 2.5 mg by nebulization every 4 (four) hours as needed for wheezing or shortness of breath.   albuterol 108 (90  Base) MCG/ACT inhaler Commonly known as: VENTOLIN HFA SMARTSIG:2 inhalation Via Inhaler Every 6 Hours PRN   blood glucose meter kit and supplies Kit Check your blood glucose levels twice a day; once in the morning before breakfast; and once in the evening after dinner.   Breztri Aerosphere 160-9-4.8 MCG/ACT Aero Generic drug: Budeson-Glycopyrrol-Formoterol Inhale 2 puffs into the lungs 2 (two) times daily.   dextromethorphan-guaiFENesin 30-600 MG 12hr tablet Commonly known as: MUCINEX DM Take 1 tablet by mouth 2 (two) times daily as needed for cough.   dorzolamide-timolol 2-0.5 % ophthalmic solution Commonly known as: COSOPT Place 1 drop into both eyes 2 (two) times daily.   eszopiclone 1 MG Tabs tablet Commonly known as: LUNESTA TAKE 1 TABLET(1 MG) BY MOUTH AT BEDTIME AS NEEDED FOR SLEEP   fentaNYL 25 MCG/HR Commonly known as: DURAGESIC Place 1 patch onto the skin every 3 (three) days.   glipiZIDE 5 MG tablet Commonly known as: GLUCOTROL TAKE 1 TABLET(5 MG) BY MOUTH DAILY BEFORE BREAKFAST   glucose blood test strip Check your blood glucose levels twice a day; once in the morning before breakfast; and once in the evening after dinner.   ipratropium-albuterol 0.5-2.5 (3) MG/3ML Soln Commonly known as: DUONEB Take 3 mLs by nebulization  every 6 (six) hours as needed.   latanoprost 0.005 % ophthalmic solution Commonly known as: XALATAN Place 1 drop into both eyes at bedtime.   lidocaine-prilocaine cream Commonly known as: EMLA Apply 1 Application topically as needed.   metFORMIN 500 MG tablet Commonly known as: GLUCOPHAGE Take 1 tablet (500 mg total) by mouth 2 (two) times daily with a meal.   montelukast 10 MG tablet Commonly known as: SINGULAIR TAKE 1 TABLET(10 MG) BY MOUTH AT BEDTIME   multivitamin with minerals Tabs tablet Take 1 tablet by mouth daily.   naloxone 4 MG/0.1ML Liqd nasal spray kit Commonly known as: NARCAN Place 1 spray into the nose once.    Nebulizer Devi Use as directed   olmesartan 40 MG tablet Commonly known as: BENICAR Take 1 tablet (40 mg total) by mouth every morning.   oxyCODONE 5 MG immediate release tablet Commonly known as: Oxy IR/ROXICODONE Take 1 tablet (5 mg total) by mouth every 8 (eight) hours as needed for severe pain.   pregabalin 50 MG capsule Commonly known as: LYRICA Take 1 capsule (50 mg total) by mouth 2 (two) times daily.   prochlorperazine 10 MG tablet Commonly known as: COMPAZINE Take 10 mg by mouth every 6 (six) hours as needed for nausea or vomiting.   propranolol ER 60 MG 24 hr capsule Commonly known as: INDERAL LA TAKE 1 CAPSULE(60 MG) BY MOUTH DAILY   tamsulosin 0.4 MG Caps capsule Commonly known as: FLOMAX Take 0.4 mg by mouth daily after supper.   triamcinolone ointment 0.5 % Commonly known as: KENALOG Apply 1 Application topically 2 (two) times daily.   venlafaxine XR 75 MG 24 hr capsule Commonly known as: EFFEXOR-XR Take 225 mg by mouth daily with breakfast.   vitamin B-12 500 MCG tablet Commonly known as: CYANOCOBALAMIN Take 1 tablet by mouth daily.        Follow-up Information     [name], MD. Schedule an appointment as soon as possible for a visit in 1 week(s).   Specialty: Family Medicine Contact information: [address] [place] [address] [phone]                Discharge Exam: Ceasar Mons Weights   01/20/23 1344  Weight: 74.4 kg   General.  Frail gentleman, in no acute distress. Pulmonary.  Lungs clear bilaterally, normal respiratory effort. CV.  Regular rate and rhythm, no JVD, rub or murmur. Abdomen.  Soft, nontender, nondistended, BS positive. CNS.  Alert and oriented .  No focal neurologic deficit. Extremities.  No edema, no cyanosis, pulses intact and symmetrical. Psychiatry.  Judgment and insight appears normal.   Condition at discharge: stable  The results of significant diagnostics from this hospitalization (including  imaging, microbiology, ancillary and laboratory) are listed below for reference.   Imaging Studies: DG Lumbar Spine 2-3 Views  Result Date: 01/20/2023 CLINICAL DATA:  Back pain after multiple falls. EXAM: LUMBAR SPINE - 2-3 VIEW COMPARISON:  July 06, 2022. FINDINGS: Mild compression deformity of L2 vertebral body is noted which is stable compared to prior exam. No acute fracture or spondylolisthesis is noted. Mild degenerative disc disease is noted at L1-2 and L2-3. Moderate degenerative disc disease is noted at L4-5 and L5-S1. IMPRESSION: Multilevel degenerative disc disease.  No acute abnormality seen. Electronically Signed   By: Lupita Raider M.D.   On: 01/20/2023 18:20   DG Chest Port 1 View  Result Date: 01/20/2023 CLINICAL DATA:  Lung cancer patient EXAM: PORTABLE CHEST 1  VIEW COMPARISON:  None Available. FINDINGS: Port in the anterior chest wall with tip in distal SVC. Volume loss in the RIGHT hemithorax. There is pleural fluid at the RIGHT lung apex with focus of cavitation not changed from comparison exam. LEFT lung clear. No acute osseous abnormality. IMPRESSION: 1. No interval change. 2. Pleural fluid and cavitation in the RIGHT upper lobe. Electronically Signed   By: Genevive Bi M.D.   On: 01/20/2023 15:13   CT CHEST ABDOMEN PELVIS W CONTRAST  Result Date: 01/17/2023 CLINICAL DATA:  Non-small-cell lung cancer. Assess treatment response. Chemotherapy and radiation. * Tracking Code: BO * EXAM: CT CHEST, ABDOMEN, AND PELVIS WITH CONTRAST TECHNIQUE: Multidetector CT imaging of the chest, abdomen and pelvis was performed following the standard protocol during bolus administration of intravenous contrast. RADIATION DOSE REDUCTION: This exam was performed according to the departmental dose-optimization program which includes automated exposure control, adjustment of the mA and/or kV according to patient size and/or use of iterative reconstruction technique. CONTRAST:  OMNIPAQUE  IOHEXOL 300 MG/ML  SOLN COMPARISON:  PET-CT 09/22/2022.  Standard CT 07/06/2022 FINDINGS: CT CHEST FINDINGS Cardiovascular: Left upper chest port. Normal caliber thoracic aorta with some vascular calcifications. Coronary artery calcifications are seen. No significant pericardial effusion. The heart is nonenlarged. Mediastinum/Nodes: Preserved esophagus. Stable thyroid gland. No specific abnormal lymph node enlargement present in the axillary regions or hilum. There are some small less than 1 cm in size in short axis mediastinal nodes, not pathologic by size criteria. The exception was in the left hilum. Previously there was a hypermetabolic node in this location. On the prior CT scan this would have measured in retrospect 2.7 x 1.3 cm. Today residual tissue in this location measures 11 x 11 mm on series 2, image 27. Previously this may have been 2 adjacent nodes. Lungs/Pleura: Left lung has some paraseptal and centrilobular emphysematous changes. Chronic interstitial changes are seen as well. There is some subtle areas of ground-glass type nodular areas. This has increased along the posteroinferior aspect of the left upper lobe on series 2, image 27 with area measuring a proximally 18 x 12 mm. Additional 5 mm area posterior left lower lobe on series 2, image 40. Previously there were similar areas in the inferior aspect of the left lower lobe which have improved. In addition the nodular area of opacity in the medial left upper lobe on the prior has shown some improvement as well. The right lung has areas of interstitial septal thickening particularly towards the upper lung zones with volume loss and air bronchograms with distortion. There is also a cavitary area in the right lung apex which appears to communicate with central bronchi on axial image 21 of series 2 with nodular pleural thickening. On PET-CT this area was hypermetabolic. Thickness of the area laterally on series 2, image 20 today measures a proximally  2.6 cm. Going back to the prior exam when measured in a similar fashion this measured 2.5 cm. The adjacent portion of the right third and fourth ribs have rib destruction. There is also a fracture of the fifth rib laterally which was seen previously. Musculoskeletal: The pleural lesion in the right upper lobe does extend through to the adjacent ribs and chest wall laterally with some stranding with thickening. Degenerative changes along the spine. CT ABDOMEN PELVIS FINDINGS Hepatobiliary: Fatty liver infiltration. No space-occupying liver lesion. The gallbladder is nondilated. Patent portal vein. Pancreas: Unremarkable. No pancreatic ductal dilatation or surrounding inflammatory changes. Spleen: Normal in size without focal  abnormality. Adrenals/Urinary Tract: Right adrenal nodule identified once again measuring 2.7 cm in maximal dimension, unchanged. Previously this is felt to be an adenoma on prior workup. There is also some focal thickening of the left adrenal gland which is stable as well. No enhancing renal mass. Extrarenal pelvis seen of the left kidney with multiple stones. Component of chronic UPJ. Preserved contours of the urinary bladder. Stomach/Bowel: Stomach is distended with contrast and debris. Small bowel is nondilated. There is a left lower quadrant colostomy. Short rectosigmoid stump. Proximal colon has scattered stool. There is a possible mass seen at the base of the cecum with a dilated appendix. This was seen previously. The level of appendiceal dilatation is slightly increasing. Please correlate for colon carcinoma with a obstructing component to the appendix. Vascular/Lymphatic: Normal caliber IVC. Scattered vascular calcifications. There are areas of stenosis suggested including at the celiac axis and SMA. Focal saccular dilatation of the infrarenal abdominal aorta with diameter including the sacculation of up to 2.7 cm. Reproductive: Prostate is unremarkable. Other: No free air or free  fluid. Musculoskeletal: Curvature of the spine with some degenerative changes. IMPRESSION: Once again there is a large right apical cavitary area with nodular wall thickening. Extension through the chest wall laterally with erosion of adjacent ribs and pathologic fracture. The cavitary lesion does appear to communicate with central bronchi. Changing patchy ground-glass ill-defined lung opacities. Recommend continued follow-up. Underlying emphysematous lung changes. Decreasing hypermetabolic left hilar lymph nodes. No new lymph node enlargement. Fatty liver infiltration. Once again there is a mass lesion identified along the cecum obstructing the orifice of the appendix. This is worrisome for a neoplasm. Again recommend further workup when clinically appropriate. UPJ changes of the left kidney with multiple nonobstructing renal stones. Adrenal adenomas. 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 Electronically Signed   By: Karen Kays M.D.   On: 01/17/2023 17:55    Microbiology: Results for orders placed or performed during the hospital encounter of 01/20/23  Blood culture (routine x 2)     Status: None (Preliminary result)   Collection Time: 01/20/23  1:49 PM   Specimen: BLOOD  Result Value Ref Range Status   Specimen Description BLOOD LEFT CHEST  Final   Special Requests   Final    BOTTLES DRAWN AEROBIC AND ANAEROBIC Blood Culture adequate volume   Culture   Final    NO GROWTH < 24 HOURS Performed at [facility], [address] [place], [place] [address]    Report Status PENDING  Incomplete  Blood culture (routine x 2)     Status: None (Preliminary result)   Collection Time: 01/20/23  2:17 PM   Specimen: BLOOD  Result Value Ref Range Status   Specimen Description BLOOD BLOOD RIGHT ARM  Final   Special Requests   Final    BOTTLES DRAWN AEROBIC AND ANAEROBIC Blood Culture results may not be optimal due to an excessive volume of blood received in culture  bottles   Culture   Final    NO GROWTH < 24 HOURS Performed at [facility], [address] [place], [place] [address]    Report Status PENDING  Incomplete    Labs: CBC: Recent Labs  Lab 01/20/23 1044 01/20/23 1357 01/20/23 1642 01/21/23 0441  WBC 10.0 8.2 5.5 4.7  NEUTROABS 7.8*  --   --   --   HGB 12.1* 10.9* 9.5* 9.8*  HCT 37.2* 34.6* 30.4* 31.4*  MCV 86.5 89.9 89.7 88.2  PLT  242 194 153 148*   Basic Metabolic Panel: Recent Labs  Lab 01/20/23 1044 01/20/23 1357 01/20/23 1642 01/21/23 0441  NA 135 137  --  139  K 3.9 3.7  --  4.0  CL 100 103  --  109  CO2 27 24  --  26  GLUCOSE 113* 173*  --  93  BUN 40* 34*  --  21  CREATININE 1.73* 1.43* 1.35* 1.01  CALCIUM 8.9 8.1*  --  8.2*  MG 2.4  --   --   --    Liver Function Tests: Recent Labs  Lab 01/20/23 1044  AST 14*  ALT 7  ALKPHOS 90  BILITOT 0.5  PROT 7.1  ALBUMIN 3.6   CBG: Recent Labs  Lab 01/20/23 1650 01/20/23 2222 01/21/23 0820 01/21/23 1047  GLUCAP 179* 72 75 111*    Discharge time spent: greater than 30 minutes.  This record has been created using Conservation officer, historic buildings. Errors have been sought and corrected,but may not always be located. Such creation errors do not reflect on the standard of care.   Signed: Arnetha Courser, MD [facility] 01/21/2023

## 2023-01-21 NOTE — Evaluation (Signed)
Physical Therapy Evaluation Patient Details Name: Rodney Vega MRN: [id number] DOB: December 01, 1955 Today's Date: 01/21/2023  History of Present Illness  Pt is a 67 y.o. male presenting to hospital 01/20/23 from oncology d/t possible dehydration, weakness, hypotension, and falls; GI illness this week.  Pt admitted with hypotension, AKI, and fall.  PMH includes recurrent locally advanced non-small cell lung CA s/p chemo and radiation (currently on immunotherapy), DM, COPD, htn, colostomy, back sx, R TKA.  Clinical Impression  Prior to hospital admission, pt was independent with functional mobility; lives with his wife in 1 level home with ramp to enter; pt reports his wife is able to assist pt as needed.  Currently pt is modified independent with bed mobility; SBA with transfers; and CGA ambulating 350 feet (no AD use).  Orthostatics taken during session: supine BP 114/66 with HR 78 bpm; sitting BP 108/59 with HR 92 bpm; standing BP (at 0 minutes) 84/61 with HR 104 bpm; and standing BP at 3 minutes 111/64 with HR 100 bpm.  Post ambulation BP 130/68 with HR 87 bpm (in sitting).  Pt asymptomatic throughout session.  Pt's nurse and MD Nelson Chimes notified regarding pt's status and BP during session.  Educated pt on sitting LE ex's to perform prior to standing (x10 reps B LE ankle pumps, LAQ's, and marching); pt also educated on gradual position changes and to sit down if he felt any adverse symptoms (pt verbalizing appropriate understanding).   Pt would currently benefit from skilled PT to address noted impairments and functional limitations (see below for any additional details).  Upon hospital discharge, pt would benefit from ongoing therapy (pt currently declining HHPT).    Recommendations for follow up therapy are one component of a multi-disciplinary discharge planning process, led by the attending physician.  Recommendations may be updated based on patient status, additional functional criteria and insurance  authorization.        Assistance Recommended at Discharge Frequent or constant Supervision/Assistance (d/t h/o orthostatic hypotension)  Patient can return home with the following  A little help with walking and/or transfers;A little help with bathing/dressing/bathroom;Assistance with cooking/housework;Assist for transportation;Help with stairs or ramp for entrance    Equipment Recommendations None recommended by PT (pt has RW and SPC at home if he feels he needs to use anything)  Recommendations for Other Services       Functional Status Assessment Patient has had a recent decline in their functional status and demonstrates the ability to make significant improvements in function in a reasonable and predictable amount of time.     Precautions / Restrictions Precautions Precautions: Fall Precaution Comments: h/o orthostatic hypotension (monitor BP); ostomy LLQ; L chest port Restrictions Weight Bearing Restrictions: No      Mobility  Bed Mobility Overal bed mobility: Modified Independent             General bed mobility comments: Semi-supine to/from sitting without any noted difficulties    Transfers Overall transfer level: Needs assistance Equipment used: None Transfers: Sit to/from Stand Sit to Stand: Supervision           General transfer comment: steady safe transfer from bed x2 trials    Ambulation/Gait Ambulation/Gait assistance: Min guard Gait Distance (Feet): 350 Feet Assistive device: None Gait Pattern/deviations: Step-through pattern Gait velocity: mildly decreased     General Gait Details: initially mildly insteady but pt appearing steady with increased distance ambulating  Stairs Stairs:  (Deferred (pt has ramp to enter home))  Wheelchair Mobility    Modified Rankin (Stroke Patients Only)       Balance Overall balance assessment: Needs assistance Sitting-balance support: No upper extremity supported, Feet  supported Sitting balance-Leahy Scale: Normal Sitting balance - Comments: steady sitting reaching outside BOS   Standing balance support: No upper extremity supported, During functional activity Standing balance-Leahy Scale: Good Standing balance comment: no loss of balance with ambulation (no UE support)                             Pertinent Vitals/Pain Pain Assessment Pain Assessment: No/denies pain O2 sats WFL during sessions activities on room air.    Home Living Family/patient expects to be discharged to:: Private residence Living Arrangements: Spouse/significant other Available Help at Discharge: Family;Available 24 hours/day Type of Home: House Home Access: Ramped entrance       Home Layout: One level Home Equipment: Agricultural consultant (2 wheels);Cane - single point;Grab bars - tub/shower;Rollator (4 wheels);Shower seat - built in      Prior Function Prior Level of Function : Independent/Modified Independent                     Higher education careers adviser        Extremity/Trunk Assessment   Upper Extremity Assessment Upper Extremity Assessment: Overall WFL for tasks assessed    Lower Extremity Assessment Lower Extremity Assessment: Generalized weakness    Cervical / Trunk Assessment Cervical / Trunk Assessment: Normal  Communication   Communication: No difficulties  Cognition Arousal/Alertness: Awake/alert Behavior During Therapy: WFL for tasks assessed/performed Overall Cognitive Status: Within Functional Limits for tasks assessed                                          General Comments  Nursing cleared pt for participation in physical therapy.  Pt agreeable to PT session.    Exercises     Assessment/Plan    PT Assessment Patient needs continued PT services  PT Problem List Decreased strength;Decreased activity tolerance;Decreased balance;Decreased mobility       PT Treatment Interventions DME instruction;Gait  training;Functional mobility training;Therapeutic activities;Therapeutic exercise;Balance training;Patient/family education    PT Goals (Current goals can be found in the Care Plan section)  Acute Rehab PT Goals Patient Stated Goal: to go home PT Goal Formulation: With patient Time For Goal Achievement: 02/04/23 Potential to Achieve Goals: Good    Frequency Min 2X/week     Co-evaluation               AM-PAC PT "6 Clicks" Mobility  Outcome Measure Help needed turning from your back to your side while in a flat bed without using bedrails?: None Help needed moving from lying on your back to sitting on the side of a flat bed without using bedrails?: None Help needed moving to and from a bed to a chair (including a wheelchair)?: None Help needed standing up from a chair using your arms (e.g., wheelchair or bedside chair)?: None Help needed to walk in hospital room?: A Little Help needed climbing 3-5 steps with a railing? : A Little 6 Click Score: 22    End of Session Equipment Utilized During Treatment: Gait belt (positioned above ostomy and below chest port) Activity Tolerance: Patient tolerated treatment well Patient left: in bed;with call bell/phone within reach;with bed alarm set Nurse Communication: Mobility status;Precautions;Other (comment) (  pt's vitals/BP during session) PT Visit Diagnosis: Other abnormalities of gait and mobility (R26.89);Muscle weakness (generalized) (M62.81);History of falling (Z91.81)    Time: 1610-9604 PT Time Calculation (min) (ACUTE ONLY): 26 min   Charges:   PT Evaluation $PT Eval Low Complexity: 1 Low PT Treatments $Therapeutic Exercise: 8-22 mins       Hendricks Limes, PT 01/21/23, 2:05 PM

## 2023-01-21 NOTE — Progress Notes (Signed)
Mobility Specialist - Progress Note     01/21/23 1438  Mobility  Activity Stood at bedside;Ambulated with assistance to bathroom;Dangled on edge of bed  Level of Assistance Standby assist, set-up cues, supervision of patient - no hands on  Assistive Device None  Distance Ambulated (ft) 10 ft  Range of Motion/Exercises Active  Activity Response Tolerated well  Mobility Referral Yes  $Mobility charge 1 Mobility   Author responded to bed alarm pt sitting EOB. Pt ambulates to bathroom SBA for safety with no AD. Pt returned to EOB with left with needs in reach and bed alarm activated.   Rodney Vega Mobility Specialist 01/21/23, 2:43 PM

## 2023-01-21 NOTE — TOC Initial Note (Signed)
Transition of Care [facility]) - Initial/Assessment Note    Patient Details  Name: Rodney Vega MRN: [id number] Date of Birth: 1956/05/09  Transition of Care [facility]) CM/SW Contact:    Chapman Fitch, RN Phone Number: 01/21/2023, 2:02 PM  Clinical Narrative:                  Admitted for: hypotension Admitted from: home with wife PCP: Burnett Sheng  Current home health/prior home health/DME: RW and cane  Patient declines home health services.  States that if he gets home and thinks he needs services he will reach out to his PCP  Expected Discharge Plan: Home w Home Health Services Barriers to Discharge: Continued Medical Work up   Patient Goals and CMS Choice Patient states their goals for this hospitalization and ongoing recovery are:: to go home today          Expected Discharge Plan and Services       Living arrangements for the past 2 months: Single Family Home                                      Prior Living Arrangements/Services Living arrangements for the past 2 months: Single Family Home Lives with:: Spouse              Current home services: DME    Activities of Daily Living Home Assistive Devices/Equipment: None ADL Screening (condition at time of admission) Patient's cognitive ability adequate to safely complete daily activities?: Yes Is the patient deaf or have difficulty hearing?: No Does the patient have difficulty seeing, even when wearing glasses/contacts?: No Does the patient have difficulty concentrating, remembering, or making decisions?: No Patient able to express need for assistance with ADLs?: Yes Does the patient have difficulty dressing or bathing?: No Independently performs ADLs?: Yes (appropriate for developmental age) Does the patient have difficulty walking or climbing stairs?: Yes Weakness of Legs: Both Weakness of Arms/Hands: Both  Permission Sought/Granted                  Emotional Assessment       Orientation: :  Oriented to Self, Oriented to Situation, Oriented to Place, Oriented to  Time      Admission diagnosis:  Dehydration [E86.0] Hypotension [I95.9] Patient Active Problem List   Diagnosis Date Noted   Hypotension 01/20/2023   Fall 01/20/2023   AKI (acute kidney injury) 01/20/2023   COPD (chronic obstructive pulmonary disease) 03/19/2022   Chronic anemia 12/02/2021   Cecum mass 12/02/2021   Malnutrition of moderate degree 12/01/2021   Inadequate oral intake 12/01/2021   Severe sepsis 11/30/2021   SOB (shortness of breath) 11/30/2021   Hypokalemia 11/30/2021   Acute on chronic respiratory failure with hypoxia 11/30/2021   Depression    SCC of lung (small cell carcinoma), right 06/08/2021   Diabetes mellitus without complication 06/04/2021   Hypertension 06/04/2021   Primary cancer of right upper lobe of lung 06/03/2021   Mass of upper lobe of right lung 05/20/2021   Status post total left knee replacement 01/06/2020   Total knee replacement status 11/16/2019   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/08/2018   Nephrolithiasis 05/08/2018   Status post total right knee replacement 05/08/2018   Primary osteoarthritis of both knees 10/11/2017   Colostomy status 08/27/2013   PCP:  [name], MD Pharmacy:   [id number] - [place], [place] - [address] [place] [address] Phone: [phone] Fax: [phone]  [id number] [place], [place] - [address] AT [facility] [address] [facility] [address] [place] [address] Phone: [phone] Fax: [phone]  [facility] [address], [place] - [address] [place] [address] Phone: [phone] Fax: [phone]     Social Determinants of Health (SDOH) Social History: SDOH Screenings   Food Insecurity: No Food Insecurity (01/20/2023)  Housing: Low Risk  (01/20/2023)  Transportation Needs: No Transportation Needs (01/20/2023)  Utilities: Not At Risk (01/20/2023)   Depression (PHQ2-9): Low Risk  (08/10/2022)  Financial Resource Strain: Low Risk  (08/10/2022)  Social Connections: Unknown (08/10/2022)  Stress: No Stress Concern Present (08/10/2022)  Tobacco Use: High Risk (01/20/2023)   SDOH Interventions:     Readmission Risk Interventions    12/03/2021   10:07 AM  Readmission Risk Prevention Plan  Transportation Screening Complete  PCP or Specialist Appt within 3-5 Days Complete  Social Work Consult for Recovery Care Planning/Counseling Complete  Palliative Care Screening Not Applicable  Medication Review Oceanographer) Complete

## 2023-01-21 NOTE — Care Management Obs Status (Signed)
MEDICARE OBSERVATION STATUS NOTIFICATION   Patient Details  Name: Rodney Vega MRN: [id number] Date of Birth: 22-Jun-1956   Medicare Observation Status Notification Given:  Yes    Chapman Fitch, RN 01/21/2023, 2:00 PM

## 2023-01-21 NOTE — Hospital Course (Addendum)
Taken from H&P   Rodney Vega is a 67 y.o. male with medical history significant of type 2 diabetes mellitus, locally advanced lung cancer on immunotherapy, depression, COPD and hypertension was brought to ED with concern of fall and significant hypotension.   Patient was apparently sick with stomach bug on Tuesday which lasted for 24 hours.  Symptoms resolved.  He normally gets IV fluid at cancer center twice weekly, missed his Tuesday appointment.  He was feeling very weak and had couple of falls earlier in the morning.  He went to cancer center and found to be very hypotensive which did not respond initially to IV fluid so he was sent to ED for further evaluation.  ED course.  On presentation patient has softer blood pressure at 76/51, afebrile.  No leukocytosis, BMP with AKI and creatinine at 1.73 with baseline less than 1.  Lactic acid of 2.5.   Initially code sepsis was called.  Received IV fluid and broad-spectrum antibiotics.  Lactic acidosis most likely secondary to dehydration.  No other obvious source of infection so sepsis ruled out and antibiotics will not be continued at this time.  Blood cultures were drawn from ED.  4/19: Hemodynamically stable with normal vitals.  Labs with resolution of AKI.  Preliminary blood cultures negative.  Procalcitonin negative and lactic acidosis resolved. Patient was able to work with PT and they were recommending home health which he declined.  Patient to have positive orthostatic vitals when sitting to standing but remained asymptomatic and able to walk around the nursing station without any difficulty.  Patient will continue on current medications and was advised to keep up with his hydration. He will continue taking IV fluids at cancer center as he was doing before.  Patient will follow-up with his providers for further recommendations.

## 2023-01-22 LAB — CULTURE, BLOOD (ROUTINE X 2)

## 2023-01-23 LAB — CULTURE, BLOOD (ROUTINE X 2): Culture: NO GROWTH

## 2023-01-24 LAB — CULTURE, BLOOD (ROUTINE X 2): Culture: NO GROWTH

## 2023-01-24 MED FILL — Iron Sucrose Inj 20 MG/ML (Fe Equiv): INTRAVENOUS | Qty: 10 | Status: AC

## 2023-01-25 ENCOUNTER — Ambulatory Visit: Payer: Medicare PPO

## 2023-01-25 ENCOUNTER — Encounter: Payer: Self-pay | Admitting: Internal Medicine

## 2023-01-25 ENCOUNTER — Inpatient Hospital Stay (HOSPITAL_BASED_OUTPATIENT_CLINIC_OR_DEPARTMENT_OTHER): Payer: Medicare PPO | Admitting: Internal Medicine

## 2023-01-25 ENCOUNTER — Inpatient Hospital Stay: Payer: Medicare PPO

## 2023-01-25 DIAGNOSIS — C3411 Malignant neoplasm of upper lobe, right bronchus or lung: Secondary | ICD-10-CM

## 2023-01-25 DIAGNOSIS — Z5112 Encounter for antineoplastic immunotherapy: Secondary | ICD-10-CM | POA: Diagnosis not present

## 2023-01-25 LAB — CBC WITH DIFFERENTIAL/PLATELET
Abs Immature Granulocytes: 0.1 10*3/uL — ABNORMAL HIGH (ref 0.00–0.07)
Basophils Absolute: 0 10*3/uL (ref 0.0–0.1)
Basophils Relative: 0 %
Eosinophils Absolute: 0.1 10*3/uL (ref 0.0–0.5)
Eosinophils Relative: 2 %
HCT: 35.5 % — ABNORMAL LOW (ref 39.0–52.0)
Hemoglobin: 11.3 g/dL — ABNORMAL LOW (ref 13.0–17.0)
Immature Granulocytes: 1 %
Lymphocytes Relative: 14 %
Lymphs Abs: 1 10*3/uL (ref 0.7–4.0)
MCH: 27.6 pg (ref 26.0–34.0)
MCHC: 31.8 g/dL (ref 30.0–36.0)
MCV: 86.6 fL (ref 80.0–100.0)
Monocytes Absolute: 0.5 10*3/uL (ref 0.1–1.0)
Monocytes Relative: 8 %
Neutro Abs: 5.3 10*3/uL (ref 1.7–7.7)
Neutrophils Relative %: 75 %
Platelets: 207 10*3/uL (ref 150–400)
RBC: 4.1 MIL/uL — ABNORMAL LOW (ref 4.22–5.81)
RDW: 14.1 % (ref 11.5–15.5)
WBC: 7.1 10*3/uL (ref 4.0–10.5)
nRBC: 0 % (ref 0.0–0.2)

## 2023-01-25 LAB — COMPREHENSIVE METABOLIC PANEL
ALT: 9 U/L (ref 0–44)
AST: 15 U/L (ref 15–41)
Albumin: 3.4 g/dL — ABNORMAL LOW (ref 3.5–5.0)
Alkaline Phosphatase: 95 U/L (ref 38–126)
Anion gap: 10 (ref 5–15)
BUN: 10 mg/dL (ref 8–23)
CO2: 27 mmol/L (ref 22–32)
Calcium: 8.4 mg/dL — ABNORMAL LOW (ref 8.9–10.3)
Chloride: 100 mmol/L (ref 98–111)
Creatinine, Ser: 1.03 mg/dL (ref 0.61–1.24)
GFR, Estimated: >60 mL/min (ref >60)
Glucose, Bld: 142 mg/dL — ABNORMAL HIGH (ref 70–99)
Potassium: 3.1 mmol/L — ABNORMAL LOW (ref 3.5–5.1)
Sodium: 137 mmol/L (ref 135–145)
Total Bilirubin: 0.3 mg/dL (ref 0.3–1.2)
Total Protein: 6.9 g/dL (ref 6.5–8.1)

## 2023-01-25 LAB — CULTURE, BLOOD (ROUTINE X 2): Special Requests: ADEQUATE

## 2023-01-25 LAB — TSH: TSH: 4.399 u[IU]/mL (ref 0.350–4.500)

## 2023-01-25 MED ORDER — ONDANSETRON HCL 4 MG/2ML IJ SOLN
8.0000 mg | Freq: Once | INTRAMUSCULAR | Status: AC
  Administered 2023-01-25: 8 mg via INTRAVENOUS
  Filled 2023-01-25: qty 4

## 2023-01-25 MED ORDER — SODIUM CHLORIDE 0.9 % IV SOLN: Freq: Once | INTRAVENOUS | Status: DC

## 2023-01-25 MED ORDER — SODIUM CHLORIDE 0.9 % IV SOLN
200.0000 mg | Freq: Once | INTRAVENOUS | Status: AC
  Administered 2023-01-25: 200 mg via INTRAVENOUS
  Filled 2023-01-25: qty 200

## 2023-01-25 MED ORDER — HEPARIN SOD (PORK) LOCK FLUSH 100 UNIT/ML IV SOLN
500.0000 [IU] | Freq: Once | INTRAVENOUS | Status: AC | PRN
  Administered 2023-01-25: 500 [IU]
  Filled 2023-01-25: qty 5

## 2023-01-25 MED ORDER — SODIUM CHLORIDE 0.9 % IV SOLN
Freq: Once | INTRAVENOUS | Status: AC
  Filled 2023-01-25: qty 250

## 2023-01-25 MED ORDER — SODIUM CHLORIDE 0.9% FLUSH
10.0000 mL | INTRAVENOUS | Status: DC | PRN
  Administered 2023-01-25: 10 mL via INTRAVENOUS
  Filled 2023-01-25: qty 10

## 2023-01-25 MED ORDER — POTASSIUM CHLORIDE 20 MEQ/100ML IV SOLN
20.0000 meq | Freq: Once | INTRAVENOUS | Status: AC
  Administered 2023-01-25: 20 meq via INTRAVENOUS

## 2023-01-25 NOTE — Patient Instructions (Signed)

## 2023-01-25 NOTE — Progress Notes (Signed)
[facility] CONSULT NOTE  Patient Care Team: [name], MD as PCP - General (Family Medicine) [name], RN as Oncology Nurse Navigator [name], [name], MD as Consulting Physician (Urology) [name], MD as Consulting Physician (Oncology) [name], MD as Consulting Physician (Pulmonary Disease)  CHIEF COMPLAINTS/PURPOSE OF CONSULTATION: lung cancer    Oncology History Overview Note  AUG 2022- 8.2 x 5.9 cm spiculated mass noted in right upper lobe consistent with malignancy. It extends from the right hilum to the lateral chest wall and results in lytic destruction of the lateral portion of the right fourth rib.  Mediastinal lymph nodes are noted, including 12 mm right hilar lymph node, consistent with metastatic disease. 3 cm right adrenal mass is noted concerning for metastatic disease.  # AUG 2022- PET IMPRESSION: Peripherally hypermetabolic right upper lobe mass, likely due to central necrosis. Mass extends into the right hilum and right lateral chest wall involving the second through fourth right ribs.   Mildly enlarged and hypermetabolic right hilar lymph node.   No evidence of metastatic disease in the abdomen or pelvis.  # AUG, 2022-RIGHT CHEST WALL Bx-necrosis; suspicious for malignancy not definitive [discussed with Dr.Kraynie;MDT] LUNG MASS, RIGHT; BIOPSY:  - SUSPICIOUS FOR MALIGNANCY.  - SEE COMMENT.   Comment:  Approximately six tissue cores demonstrate inflamed fibrous capsule with  hemosiderin-laden macrophages, in a background of abundant necrosis.  One of the cores demonstrates a minute fragment of viable epithelial  cells.  These cells are positive for p40.  They are negative for TTF1  and CK7. The cells of interest disappear on deeper cut levels.  While  the findings are suspicious for squamous cell carcinoma, there is not  enough viable tumor for a definitive diagnosis.  Additional tissue  sampling may be helpful.    # SEP 2022-cycle #2 Taxol hypersensitive reaction.  Switched over to #3 cycle- carbo-Abraxane starting 06/29/2021.  Discontinued IMFINZI therapy-December 2022 given poor tolerance/pneumonia admission to hospital.  #  [name] was in May-June 2023 x3 doses; stopped because of intolerance-fatigue patient preference/lack of progression.  # JAN 2024-bronchoscopy biopsy negative for malignancy; however PET scan concerning for recurrence/progression.    May 05, 2022-s/p bronchoscopy- with Dr.Aleskerov-significant purulent/necrotic debris noted. Dec 23rd, 2023- PET scan: The cavitary process in the right upper lobe is similar in size to previous CTS, although demonstrates prominent peripheral hypermetabolic activity. 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 Hypermetabolic activity within the adjacent upper right lateral chest wall with right 3rd through 5th rib deformities, suspicious for direct tumor spread. No evidence of distant osseous metastatic disease; Hypermetabolic left hilar lymph node worrisome for metastatic disease. No other hypermetabolic lymph nodes or distant metastases identified.  Bronchoscopy/EBUS [JAN 26th, 2024- Dr.A]- BAL/ 4R-lymph node FNA negative for malignancy.  Patient declined further invasive procedures.  # FEB 15t, 2024- RE-Start Keytruda.   # DEC 2022- s/p ID- Dr.ravishankar- ? Lung abscess-no antibiotics-  JAN 2023- CT scan incidental- cecal mass- colonoscopy[Dr.Russo; [facility]- high grade dysplasia]  S/p evaluation with Dr. Sherryle Lis surgery for now]   # S/p evaluation with Dr. Sheppard Penton.   Primary cancer of right upper lobe of lung  06/03/2021 Initial Diagnosis   Primary cancer of right upper lobe of lung (HCC)   06/03/2021 Cancer Staging   Staging form: Lung, AJCC 8th Edition - Clinical: Stage IIIB (cT3, cN2, cM0) - Signed by [name],  MD on 06/03/2021   06/15/2021 - 08/03/2021 Chemotherapy   Patient is on Treatment Plan : LUNG Carboplatin / Paclitaxel + XRT q7d     10/02/2021 - 10/19/2021 Chemotherapy   Patient is on Treatment Plan : LUNG Durvalumab q14d     02/03/2022 -  Chemotherapy   Patient is on Treatment Plan : LUNG NSCLC Pembrolizumab (200) q21d     02/04/2022 - 03/18/2022 Chemotherapy   Patient is on Treatment Plan : LUNG NSCLC Pembrolizumab (200) q21d       HISTORY OF PRESENTING ILLNESS: Patient is ambulating independently.  Accompanied by wife.  Rodney Vega 67 y.o.  male history of smoking -"lung cancer" [Limited necrotic tissue]-locally advanced unresectable; and Cecal mass-high grade dysplasia intact- currently on single agent [name] is here for follow-up/reviewed to the CT scan.  Patient was evaluated in [facility] -  last week noted to have-refractory hypotension needing IV fluids subsequently admission to the hospital.  No further falls.  Patient feels improved since discharged.  Pt continues to have pain in right shoulder and side under arm.   He continues to get IV fluids-twice a week-feels improved after IV infusions. Denies abdominal pain nausea vomiting or diarrhea.  No blood in stools black or stools.  Review of Systems  Constitutional:  Positive for malaise/fatigue and weight loss. Negative for chills, diaphoresis and fever.  HENT:  Negative for nosebleeds and sore throat.   Eyes:  Negative for double vision.  Respiratory:  Positive for cough, sputum production and shortness of breath. Negative for hemoptysis and wheezing.   Cardiovascular:  Positive for chest pain. Negative for palpitations, orthopnea and leg swelling.  Gastrointestinal:  Positive for nausea. Negative for abdominal pain, blood in stool, constipation, diarrhea, heartburn, melena and vomiting.  Genitourinary:  Negative for dysuria, frequency and urgency.  Musculoskeletal:  Negative for back pain and joint pain.  Skin: Negative.   Negative for itching and rash.  Neurological:  Positive for tingling. Negative for dizziness, focal weakness, weakness and headaches.  Endo/Heme/Allergies:  Does not bruise/bleed easily.  Psychiatric/Behavioral:  Negative for depression. The patient is not nervous/anxious and does not have insomnia.      MEDICAL HISTORY:  Past Medical History:  Diagnosis Date   Acute on chronic respiratory failure    Anemia    Cancer    Colostomy present    COPD (chronic obstructive pulmonary disease)    Degenerative arthritis of knee    Depression    Diabetes mellitus without complication    Diverticulitis    Dyspnea    Glaucoma    since 2004   Hernia 1979   History of kidney stones    History of methicillin resistant staphylococcus aureus (MRSA)    Hypertension    Hypokalemia    Marijuana use    Mass of cecum    Nephrolithiasis    Neuromuscular disorder    Personal history of tobacco use, presenting hazards to health    Pneumonia    Port-A-Cath in place    Pre-diabetes    Primary cancer of right upper lobe of lung    SCC of lung (small cell carcinoma)    Screening for obesity    Severe sepsis    Special screening for malignant neoplasms, colon    Tobacco use    Wears dentures    full upper and lower    SURGICAL HISTORY: Past Surgical History:  Procedure Laterality Date   BACK SURGERY  1994, 2012   lumbar bulging disc   BRONCHOSCOPY  05/05/2022   CATARACT EXTRACTION W/PHACO Left 01/20/2021   Procedure: CATARACT EXTRACTION PHACO AND INTRAOCULAR LENS PLACEMENT (IOC) LEFT;  Surgeon: Galen Manila, MD;  Location: [facility];  Service: Ophthalmology;  Laterality: Left;  10.13 0:52.1   COLONOSCOPY  [id number]   [facility]/Dr. Carmell Austria sessile polyp,73mm, found in rectum,multiple diverticula were found in the sigmoid, in descending and in transverse colon   COLONOSCOPY WITH PROPOFOL N/A 10/22/2021   Procedure: COLONOSCOPY WITH PROPOFOL;  Surgeon: Jaynie Collins, DO;   Location: [facility];  Service: Gastroenterology;  Laterality: N/A;  DM   COLOSTOMY  04/20/2013   EYE SURGERY     HERNIA REPAIR  1979   inguinal   IR IMAGING GUIDED PORT INSERTION  06/10/2021   JOINT REPLACEMENT Right    knee   KNEE ARTHROPLASTY Right 05/08/2018   Procedure: COMPUTER ASSISTED TOTAL KNEE ARTHROPLASTY;  Surgeon: Donato Heinz, MD;  Location: [facility];  Service: Orthopedics;  Laterality: Right;   KNEE ARTHROPLASTY Left 11/16/2019   Procedure: COMPUTER ASSISTED TOTAL KNEE ARTHROPLASTY;  Surgeon: Donato Heinz, MD;  Location: [facility];  Service: Orthopedics;  Laterality: Left;   LAPAROTOMY  04/20/2013   colon resection with colosotmy   LITHOTRIPSY  2013   LUNG BIOPSY  2023   RIGID BRONCHOSCOPY N/A 10/29/2022   Procedure: RIGID BRONCHOSCOPY;  Surgeon: [name], MD;  Location: [facility];  Service: Thoracic;  Laterality: N/A;   TONSILLECTOMY AND ADENOIDECTOMY  1962   VIDEO BRONCHOSCOPY WITH ENDOBRONCHIAL NAVIGATION N/A 05/05/2022   Procedure: VIDEO BRONCHOSCOPY WITH ENDOBRONCHIAL NAVIGATION;  Surgeon: [name], MD;  Location: [facility];  Service: Thoracic;  Laterality: N/A;   VIDEO BRONCHOSCOPY WITH ENDOBRONCHIAL ULTRASOUND N/A 05/05/2022   Procedure: VIDEO BRONCHOSCOPY WITH ENDOBRONCHIAL ULTRASOUND;  Surgeon: [name], MD;  Location: [facility];  Service: Thoracic;  Laterality: N/A;   VIDEO BRONCHOSCOPY WITH ENDOBRONCHIAL ULTRASOUND N/A 10/29/2022   Procedure: VIDEO BRONCHOSCOPY WITH ENDOBRONCHIAL ULTRASOUND;  Surgeon: [name], MD;  Location: [facility];  Service: Thoracic;  Laterality: N/A;    SOCIAL HISTORY: Social History   Socioeconomic History   Marital status: Married    Spouse name: Pegge   Number of children: Not on file   Years of education: Not on file   Highest education level: Not on file  Occupational History   Not on file  Tobacco Use   Smoking status: Every Day    Packs/day: 1.00    Years: 30.00    Additional pack years: 0.00     Total pack years: 30.00    Types: Cigarettes   Smokeless tobacco: Never   Tobacco comments:    since age 25 or 24  Vaping Use   Vaping Use: Never used  Substance and Sexual Activity   Alcohol use: Yes    Alcohol/week: 2.0 standard drinks of alcohol    Types: 2 Standard drinks or equivalent per week    Comment: rare   Drug use: Yes    Types: Marijuana    Comment: fentanyl patch prescribed & oxycodone   Sexual activity: Not on file  Other Topics Concern   Not on file  Social History Narrative   [place]; smoking; not much alcohol. Worked for [employer], 2019. Marland Kitchen    Social Determinants of Health   Financial Resource Strain: Low Risk  (08/10/2022)   Overall Financial Resource Strain (CARDIA)    Difficulty of Paying Living Expenses: Not very hard  Food Insecurity: No Food Insecurity (01/20/2023)   Hunger  Vital Sign    Worried About Programme researcher, broadcasting/film/video in the Last Year: Never true    Ran Out of Food in the Last Year: Never true  Transportation Needs: No Transportation Needs (01/20/2023)   PRAPARE - Administrator, Civil Service (Medical): No    Lack of Transportation (Non-Medical): No  Physical Activity: Not on file  Stress: No Stress Concern Present (08/10/2022)   [employer] of Occupational Health - Occupational Stress Questionnaire    Feeling of Stress : Not at all  Social Connections: Unknown (08/10/2022)   Social Connection and Isolation Panel [NHANES]    Frequency of Communication with Friends and Family: Three times a week    Frequency of Social Gatherings with Friends and Family: Twice a week    Attends Religious Services: Patient declined    Database administrator or Organizations: No    Attends Banker Meetings: Never    Marital Status: Married  Catering manager Violence: Not At Risk (01/20/2023)   Humiliation, Afraid, Rape, and Kick questionnaire    Fear of Current or Ex-Partner: No    Emotionally Abused: No     Physically Abused: No    Sexually Abused: No    FAMILY HISTORY: Family History  Problem Relation Age of Onset   Diabetes Mother    Heart disease Mother    Heart attack Father    Prostate cancer Father     ALLERGIES:  is allergic to paclitaxel, ambien [zolpidem], nsaids, and aleve [naproxen sodium].  MEDICATIONS:  Current Outpatient Medications  Medication Sig Dispense Refill   Accu-Chek Softclix Lancets lancets USE AS DIRECTED FOUR TIMES DAILY 100 each 12   acetaminophen (TYLENOL) 500 MG tablet Take 2 tablets (1,000 mg total) by mouth daily as needed. Home med. 30 tablet 0   albuterol (PROVENTIL) (2.5 MG/3ML) 0.083% nebulizer solution Take 2.5 mg by nebulization every 4 (four) hours as needed for wheezing or shortness of breath.     albuterol (VENTOLIN HFA) 108 (90 Base) MCG/ACT inhaler SMARTSIG:2 inhalation Via Inhaler Every 6 Hours PRN 1 each 2   blood glucose meter kit and supplies KIT Check your blood glucose levels twice a day; once in the morning before breakfast; and once in the evening after dinner. 1 each 0   BREZTRI AEROSPHERE 160-9-4.8 MCG/ACT AERO Inhale 2 puffs into the lungs 2 (two) times daily.     dextromethorphan-guaiFENesin (MUCINEX DM) 30-600 MG 12hr tablet Take 1 tablet by mouth 2 (two) times daily as needed for cough.     dorzolamide-timolol (COSOPT) 22.3-6.8 MG/ML ophthalmic solution Place 1 drop into both eyes 2 (two) times daily.     eszopiclone (LUNESTA) 1 MG TABS tablet TAKE 1 TABLET(1 MG) BY MOUTH AT BEDTIME AS NEEDED FOR SLEEP 30 tablet 1   fentaNYL (DURAGESIC) 25 MCG/HR Place 1 patch onto the skin every 3 (three) days. 10 patch 0   glipiZIDE (GLUCOTROL) 5 MG tablet TAKE 1 TABLET(5 MG) BY MOUTH DAILY BEFORE BREAKFAST 90 tablet 2   glucose blood test strip Check your blood glucose levels twice a day; once in the morning before breakfast; and once in the evening after dinner. 100 each 12   ipratropium-albuterol (DUONEB) 0.5-2.5 (3) MG/3ML SOLN Take 3 mLs by  nebulization every 6 (six) hours as needed. 360 mL 1   latanoprost (XALATAN) 0.005 % ophthalmic solution Place 1 drop into both eyes at bedtime.     lidocaine-prilocaine (EMLA) cream Apply 1 Application topically as needed. 30  g 3   metFORMIN (GLUCOPHAGE) 500 MG tablet Take 1 tablet (500 mg total) by mouth 2 (two) times daily with a meal. 60 tablet 2   montelukast (SINGULAIR) 10 MG tablet TAKE 1 TABLET(10 MG) BY MOUTH AT BEDTIME 90 tablet 0   Multiple Vitamin (MULTIVITAMIN WITH MINERALS) TABS tablet Take 1 tablet by mouth daily.     naloxone (NARCAN) nasal spray 4 mg/0.1 mL Place 1 spray into the nose once.     oxyCODONE (OXY IR/ROXICODONE) 5 MG immediate release tablet Take 1 tablet (5 mg total) by mouth every 8 (eight) hours as needed for severe pain. 90 tablet 0   pregabalin (LYRICA) 50 MG capsule Take 1 capsule (50 mg total) by mouth 2 (two) times daily. 60 capsule 2   prochlorperazine (COMPAZINE) 10 MG tablet Take 10 mg by mouth every 6 (six) hours as needed for nausea or vomiting.     Respiratory Therapy Supplies (NEBULIZER) DEVI Use as directed 1 each 0   tamsulosin (FLOMAX) 0.4 MG CAPS capsule Take 0.4 mg by mouth daily after supper.     triamcinolone ointment (KENALOG) 0.5 % Apply 1 Application topically 2 (two) times daily. 30 g 1   venlafaxine XR (EFFEXOR-XR) 75 MG 24 hr capsule Take 225 mg by mouth daily with breakfast.     olmesartan (BENICAR) 40 MG tablet Take 1 tablet (40 mg total) by mouth every morning. (Patient not taking: Reported on 01/25/2023) 30 tablet 3   propranolol ER (INDERAL LA) 60 MG 24 hr capsule TAKE 1 CAPSULE(60 MG) BY MOUTH DAILY (Patient not taking: Reported on 01/25/2023) 30 capsule 3   vitamin B-12 (CYANOCOBALAMIN) 500 MCG tablet Take 1 tablet by mouth daily. (Patient not taking: Reported on 01/20/2023)     No current facility-administered medications for this visit.   Facility-Administered Medications Ordered in Other Visits  Medication Dose Route Frequency  Provider Last Rate Last Admin   heparin lock flush 100 UNIT/ML injection            heparin lock flush 100 UNIT/ML injection            heparin lock flush 100 UNIT/ML injection               .  PHYSICAL EXAMINATION: ECOG PERFORMANCE STATUS: 1 - Symptomatic but completely ambulatory  Vitals:   01/25/23 0932  BP: 139/74  Pulse: 100  Temp: 98.1 F (36.7 C)  SpO2: 96%          Filed Weights   01/25/23 0932  Weight: 163 lb (73.9 kg)   Mild skin rash in the back.   Physical Exam Vitals and nursing note reviewed.  HENT:     Head: Normocephalic and atraumatic.     Mouth/Throat:     Pharynx: Oropharynx is clear.  Eyes:     Extraocular Movements: Extraocular movements intact.     Pupils: Pupils are equal, round, and reactive to light.  Cardiovascular:     Rate and Rhythm: Normal rate and regular rhythm.  Pulmonary:     Comments: Decreased breath sounds bilaterally.  Abdominal:     Palpations: Abdomen is soft.  Musculoskeletal:        General: Normal range of motion.     Cervical back: Normal range of motion.  Skin:    General: Skin is warm.  Neurological:     General: No focal deficit present.     Mental Status: He is alert and oriented to person, place, and time.  Psychiatric:  Behavior: Behavior normal.        Judgment: Judgment normal.      LABORATORY DATA:  I have reviewed the data as listed Lab Results  Component Value Date   WBC 7.1 01/25/2023   HGB 11.3 (L) 01/25/2023   HCT 35.5 (L) 01/25/2023   MCV 86.6 01/25/2023   PLT 207 01/25/2023   Recent Labs    01/04/23 1032 01/20/23 1044 01/20/23 1357 01/20/23 1642 01/21/23 0441 01/25/23 0939  NA 133* 135 137  --  139 137  K 3.3* 3.9 3.7  --  4.0 3.1*  CL 99 100 103  --  109 100  CO2 25 27 24   --  26 27  GLUCOSE 182* 113* 173*  --  93 142*  BUN 8 40* 34*  --  21 10  CREATININE 0.96 1.73* 1.43* 1.35* 1.01 1.03  CALCIUM 8.5* 8.9 8.1*  --  8.2* 8.4*  GFRNONAA >60 43* 54* 58* >60 >60   PROT 6.8 7.1  --   --   --  6.9  ALBUMIN 3.3* 3.6  --   --   --  3.4*  AST 19 14*  --   --   --  15  ALT 7 7  --   --   --  9  ALKPHOS 97 90  --   --   --  95  BILITOT 0.3 0.5  --   --   --  0.3    RADIOGRAPHIC STUDIES: I have personally reviewed the radiological images as listed and agreed with the findings in the report. DG Lumbar Spine 2-3 Views  Result Date: 01/20/2023 CLINICAL DATA:  Back pain after multiple falls. EXAM: LUMBAR SPINE - 2-3 VIEW COMPARISON:  July 06, 2022. FINDINGS: Mild compression deformity of L2 vertebral body is noted which is stable compared to prior exam. No acute fracture or spondylolisthesis is noted. Mild degenerative disc disease is noted at L1-2 and L2-3. Moderate degenerative disc disease is noted at L4-5 and L5-S1. IMPRESSION: Multilevel degenerative disc disease.  No acute abnormality seen. Electronically Signed   By: Lupita Raider M.D.   On: 01/20/2023 18:20   DG Chest Port 1 View  Result Date: 01/20/2023 CLINICAL DATA:  Lung cancer patient EXAM: PORTABLE CHEST 1 VIEW COMPARISON:  None Available. FINDINGS: Port in the anterior chest wall with tip in distal SVC. Volume loss in the RIGHT hemithorax. There is pleural fluid at the RIGHT lung apex with focus of cavitation not changed from comparison exam. LEFT lung clear. No acute osseous abnormality. IMPRESSION: 1. No interval change. 2. Pleural fluid and cavitation in the RIGHT upper lobe. Electronically Signed   By: Genevive Bi M.D.   On: 01/20/2023 15:13   CT CHEST ABDOMEN PELVIS W CONTRAST  Result Date: 01/17/2023 CLINICAL DATA:  Non-small-cell lung cancer. Assess treatment response. Chemotherapy and radiation. * Tracking Code: BO * EXAM: CT CHEST, ABDOMEN, AND PELVIS WITH CONTRAST TECHNIQUE: Multidetector CT imaging of the chest, abdomen and pelvis was performed following the standard protocol during bolus administration of intravenous contrast. RADIATION DOSE REDUCTION: This exam was performed  according to the departmental dose-optimization program which includes automated exposure control, adjustment of the mA and/or kV according to patient size and/or use of iterative reconstruction technique. CONTRAST:  OMNIPAQUE IOHEXOL 300 MG/ML  SOLN COMPARISON:  PET-CT 09/22/2022.  Standard CT 07/06/2022 FINDINGS: CT CHEST FINDINGS Cardiovascular: Left upper chest port. Normal caliber thoracic aorta with some vascular calcifications. Coronary artery calcifications are  seen. No significant pericardial effusion. The heart is nonenlarged. Mediastinum/Nodes: Preserved esophagus. Stable thyroid gland. No specific abnormal lymph node enlargement present in the axillary regions or hilum. There are some small less than 1 cm in size in short axis mediastinal nodes, not pathologic by size criteria. The exception was in the left hilum. Previously there was a hypermetabolic node in this location. On the prior CT scan this would have measured in retrospect 2.7 x 1.3 cm. Today residual tissue in this location measures 11 x 11 mm on series 2, image 27. Previously this may have been 2 adjacent nodes. Lungs/Pleura: Left lung has some paraseptal and centrilobular emphysematous changes. Chronic interstitial changes are seen as well. There is some subtle areas of ground-glass type nodular areas. This has increased along the posteroinferior aspect of the left upper lobe on series 2, image 27 with area measuring a proximally 18 x 12 mm. Additional 5 mm area posterior left lower lobe on series 2, image 40. Previously there were similar areas in the inferior aspect of the left lower lobe which have improved. In addition the nodular area of opacity in the medial left upper lobe on the prior has shown some improvement as well. The right lung has areas of interstitial septal thickening particularly towards the upper lung zones with volume loss and air bronchograms with distortion. There is also a cavitary area in the right lung apex  which appears to communicate with central bronchi on axial image 21 of series 2 with nodular pleural thickening. On PET-CT this area was hypermetabolic. Thickness of the area laterally on series 2, image 20 today measures a proximally 2.6 cm. Going back to the prior exam when measured in a similar fashion this measured 2.5 cm. The adjacent portion of the right third and fourth ribs have rib destruction. There is also a fracture of the fifth rib laterally which was seen previously. Musculoskeletal: The pleural lesion in the right upper lobe does extend through to the adjacent ribs and chest wall laterally with some stranding with thickening. Degenerative changes along the spine. CT ABDOMEN PELVIS FINDINGS Hepatobiliary: Fatty liver infiltration. No space-occupying liver lesion. The gallbladder is nondilated. Patent portal vein. Pancreas: Unremarkable. No pancreatic ductal dilatation or surrounding inflammatory changes. Spleen: Normal in size without focal abnormality. Adrenals/Urinary Tract: Right adrenal nodule identified once again measuring 2.7 cm in maximal dimension, unchanged. Previously this is felt to be an adenoma on prior workup. There is also some focal thickening of the left adrenal gland which is stable as well. No enhancing renal mass. Extrarenal pelvis seen of the left kidney with multiple stones. Component of chronic UPJ. Preserved contours of the urinary bladder. Stomach/Bowel: Stomach is distended with contrast and debris. Small bowel is nondilated. There is a left lower quadrant colostomy. Short rectosigmoid stump. Proximal colon has scattered stool. There is a possible mass seen at the base of the cecum with a dilated appendix. This was seen previously. The level of appendiceal dilatation is slightly increasing. Please correlate for colon carcinoma with a obstructing component to the appendix. Vascular/Lymphatic: Normal caliber IVC. Scattered vascular calcifications. There are areas of stenosis  suggested including at the celiac axis and SMA. Focal saccular dilatation of the infrarenal abdominal aorta with diameter including the sacculation of up to 2.7 cm. Reproductive: Prostate is unremarkable. Other: No free air or free fluid. Musculoskeletal: Curvature of the spine with some degenerative changes. IMPRESSION: Once again there is a large right apical cavitary area with nodular wall thickening. Extension  through the chest wall laterally with erosion of adjacent ribs and pathologic fracture. The cavitary lesion does appear to communicate with central bronchi. Changing patchy ground-glass ill-defined lung opacities. Recommend continued follow-up. Underlying emphysematous lung changes. Decreasing hypermetabolic left hilar lymph nodes. No new lymph node enlargement. Fatty liver infiltration. Once again there is a mass lesion identified along the cecum obstructing the orifice of the appendix. This is worrisome for a neoplasm. Again recommend further workup when clinically appropriate. UPJ changes of the left kidney with multiple nonobstructing renal stones. Adrenal adenomas. 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 Electronically Signed   By: Karen Kays M.D.   On: 01/17/2023 17:55    ASSESSMENT & PLAN:   Primary cancer of right upper lobe of lung # UNRESECTABLE/LOCALLY ADVANCED- Right upper lobe lung mass -concerning for malignancy [biopsy inconclusive/atypical cells]-  April 2023- PD-L1 positive [cirucologene].  PET-December 2023 concern for worsening mediastinal lymphadenopathy.  Bronchoscopy/EBUS [JAN 26th, 2024- Dr.A]- BAL/ 4R-lymph node FNA negative for malignancy.  Patient declined further invasive procedures.  Currently status post Keytruda- APRIL 2024-stable right upper necrotic mass noted with extension through the chest wall laterally with erosion of adjacent ribs and pathologic fracture.; slight decrease in size of the mediastinal lymph node.   # Had long  discussion with the patient regarding ongoing treatments-however given patient's extreme fatigue secondary to the treatment/multiple falls-intermittent hypotension patient states that the benefits of the treatment are outweighed by the ongoing side effects/poor tolerance.  Patient declines further therapy.  # Patient however wants to continue symptomatic management with IV fluids; supportive care as needed.   # Intermittent hypotension/falls-needing recent hospitalization.  Question adrenal insufficiency.  Checking cortisol next visit.  # rash/itch of skin- G-12 sec to [place]- stable; on kenalog.   # Anemia- mild to moderate- feb 2024- iron sat- 17%; ferritin- 61- recommend venofer weekly x3-    #Right chest wall pain-/pleuritic-rib destruction malignancy versus radiation- ADDED back [march 2024; [name]]-fenatny patch 25 mcg.  Oxycodone 5 mg every 8 to 12 hours hours as needed overall-  stable.   # COPD/fatigue- [coughing/phlegm/ no fevers]- on albuterol/advair [smoking]- TSH- WNL;Continue  Xopenex/ipratropium nebs-; Mucinex- DM BID. stable.   # Hypokalemia-3.2- continue potassium 20 mEq liquid.-Hypocalemia: 8.4/albumin- 3.2. 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 Lemar Livings. CT APRIL- 2024- mass lesion identified along the cecum obstructing the orifice of the appendix.  Clinically stable continue surveillance given patient's multiple comorbidities/reluctance with any surgical options.  # Poorly controlled diabetes-blood sugars 113- 280;  Continue glipizide; and  Metformin. stable.   # IV mediport: functioning/Stable.  # Insomnia-continue Lunesta refill.  *appt thru mychart   # DISPOSITION:  # HOLD Keytruda today; venofer today  IVFs over 1 hour. Wait for cmp- for  KCL 20 IV.   # in 1 week- IVFs-1 lit /1 hourTuesdays/Thursday;     # in 2 weeks-APP-lab- AM cortisol;  cbc/bmp-  IVFs-1 lit /1 hourTuesdays/Thursday   # # in 3 weeks-  IVFs-1 lit /1 hourTuesdays/Thursday  # follow up in 4 week Tuesday- MD; labs- cbc/cmp; TSH;  IVFs-1 lit /1 hour;KCL 20 IV; - Dr.B  # I reviewed the blood work- with the patient in detail; also reviewed the imaging independently [as summarized above]; and with the patient in detail.    All questions were answered. The patient knows to call the clinic with any problems, questions or concerns.  [name], MD 01/25/2023 3:47 PM

## 2023-01-25 NOTE — Assessment & Plan Note (Addendum)
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  PET-December 2023 concern for worsening mediastinal lymphadenopathy.  Bronchoscopy/EBUS [JAN 26th, 2024- Dr.A]- BAL/ 4R-lymph node FNA negative for malignancy.  Patient declined further invasive procedures.  Currently status post Keytruda- APRIL 2024-stable right upper necrotic mass noted with extension through the chest wall laterally with erosion of adjacent ribs and pathologic fracture.; slight decrease in size of the mediastinal lymph node.   # Had long discussion with the patient regarding ongoing treatments-however given patient's extreme fatigue secondary to the treatment/multiple falls-intermittent hypotension patient states that the benefits of the treatment are outweighed by the ongoing side effects/poor tolerance.  Patient declines further therapy.  # Patient however wants to continue symptomatic management with IV fluids; supportive care as needed.   # Intermittent hypotension/falls-needing recent hospitalization.  Question adrenal insufficiency.  Checking cortisol next visit.  # rash/itch of skin- G-12 sec to [place]- stable; on kenalog.   # Anemia- mild to moderate- feb 2024- iron sat- 17%; ferritin- 61- recommend venofer weekly x3-    #Right chest wall pain-/pleuritic-rib destruction malignancy versus radiation- ADDED back [march 2024; [name]]-fenatny patch 25 mcg.  Oxycodone 5 mg every 8 to 12 hours hours as needed overall-  stable.   # COPD/fatigue- [coughing/phlegm/ no fevers]- on albuterol/advair [smoking]- TSH- WNL;Continue  Xopenex/ipratropium nebs-; Mucinex- DM BID. stable.   # Hypokalemia-3.2- continue potassium 20 mEq liquid.-Hypocalemia: 8.4/albumin- 3.2. 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  Lemar Livings. CT APRIL- 2024- mass lesion identified along the cecum obstructing the orifice of the appendix.  Clinically stable continue surveillance given patient's multiple comorbidities/reluctance with any surgical options.  # Poorly controlled diabetes-blood sugars 113- 280;  Continue glipizide; and  Metformin. stable.   # IV mediport: functioning/Stable.  # Insomnia-continue Lunesta refill.  *appt thru mychart   # DISPOSITION:  # HOLD Keytruda today; venofer today  IVFs over 1 hour. Wait for cmp- for  KCL 20 IV.   # in 1 week- IVFs-1 lit /1 hourTuesdays/Thursday;     # in 2 weeks-APP-lab- AM cortisol; cbc/bmp-  IVFs-1 lit /1 hourTuesdays/Thursday   # # in 3 weeks-  IVFs-1 lit /1 hourTuesdays/Thursday  # follow up in 4 week Tuesday- MD; labs- cbc/cmp; TSH;  IVFs-1 lit /1 hour;KCL 20 IV; - Dr.B  # I reviewed the blood work- with the patient in detail; also reviewed the imaging independently [as summarized above]; and with the patient in detail.

## 2023-01-25 NOTE — Progress Notes (Signed)
No concerns today. Did say he fell last Thursday. Was in [facility] for fluids and BP being low since last visit.

## 2023-01-26 MED FILL — Iron Sucrose Inj 20 MG/ML (Fe Equiv): INTRAVENOUS | Qty: 10 | Status: AC

## 2023-01-27 ENCOUNTER — Ambulatory Visit: Payer: Medicare PPO

## 2023-01-27 ENCOUNTER — Inpatient Hospital Stay: Payer: Medicare PPO

## 2023-01-27 VITALS — BP 119/66 | HR 92 | Temp 97.0°F | Resp 18 | Wt 161.9 lb

## 2023-01-27 DIAGNOSIS — Z5112 Encounter for antineoplastic immunotherapy: Secondary | ICD-10-CM | POA: Diagnosis not present

## 2023-01-27 DIAGNOSIS — C3411 Malignant neoplasm of upper lobe, right bronchus or lung: Secondary | ICD-10-CM

## 2023-01-27 MED ORDER — SODIUM CHLORIDE 0.9 % IV SOLN
Freq: Once | INTRAVENOUS | Status: AC
  Filled 2023-01-27: qty 250

## 2023-01-27 MED ORDER — SODIUM CHLORIDE 0.9 % IV SOLN: Freq: Once | INTRAVENOUS | Status: DC

## 2023-01-27 MED ORDER — PROCHLORPERAZINE MALEATE 10 MG PO TABS: 10.0000 mg | ORAL_TABLET | Freq: Four times a day (QID) | ORAL | 1 refills | Status: DC | PRN

## 2023-01-27 MED ORDER — HEPARIN SOD (PORK) LOCK FLUSH 100 UNIT/ML IV SOLN
500.0000 [IU] | Freq: Once | INTRAVENOUS | Status: AC | PRN
  Administered 2023-01-27: 500 [IU]
  Filled 2023-01-27: qty 5

## 2023-01-27 MED ORDER — ONDANSETRON HCL 4 MG/2ML IJ SOLN
8.0000 mg | Freq: Once | INTRAMUSCULAR | Status: AC
  Administered 2023-01-27: 8 mg via INTRAVENOUS
  Filled 2023-01-27: qty 4

## 2023-02-01 ENCOUNTER — Inpatient Hospital Stay: Payer: Medicare PPO

## 2023-02-01 VITALS — BP 136/80 | HR 101 | Temp 97.8°F | Resp 17 | Wt 163.8 lb

## 2023-02-01 DIAGNOSIS — C3411 Malignant neoplasm of upper lobe, right bronchus or lung: Secondary | ICD-10-CM

## 2023-02-01 DIAGNOSIS — Z5112 Encounter for antineoplastic immunotherapy: Secondary | ICD-10-CM | POA: Diagnosis not present

## 2023-02-01 MED ORDER — SODIUM CHLORIDE 0.9 % IV SOLN
200.0000 mg | Freq: Once | INTRAVENOUS | Status: AC
  Administered 2023-02-01: 200 mg via INTRAVENOUS
  Filled 2023-02-01: qty 200

## 2023-02-01 MED ORDER — SODIUM CHLORIDE 0.9 % IV SOLN
Freq: Once | INTRAVENOUS | Status: AC
  Filled 2023-02-01: qty 250

## 2023-02-01 MED ORDER — HEPARIN SOD (PORK) LOCK FLUSH 100 UNIT/ML IV SOLN
500.0000 [IU] | Freq: Once | INTRAVENOUS | Status: AC | PRN
  Administered 2023-02-01: 500 [IU]
  Filled 2023-02-01: qty 5

## 2023-02-01 NOTE — Patient Instructions (Signed)

## 2023-02-02 MED FILL — Iron Sucrose Inj 20 MG/ML (Fe Equiv): INTRAVENOUS | Qty: 10 | Status: AC

## 2023-02-03 ENCOUNTER — Inpatient Hospital Stay: Payer: Medicare PPO | Attending: Internal Medicine

## 2023-02-03 VITALS — BP 144/77 | HR 82 | Temp 96.8°F | Resp 20 | Wt 164.4 lb

## 2023-02-03 DIAGNOSIS — Z79899 Other long term (current) drug therapy: Secondary | ICD-10-CM | POA: Diagnosis not present

## 2023-02-03 DIAGNOSIS — E876 Hypokalemia: Secondary | ICD-10-CM | POA: Insufficient documentation

## 2023-02-03 DIAGNOSIS — G893 Neoplasm related pain (acute) (chronic): Secondary | ICD-10-CM | POA: Insufficient documentation

## 2023-02-03 DIAGNOSIS — K760 Fatty (change of) liver, not elsewhere classified: Secondary | ICD-10-CM | POA: Diagnosis not present

## 2023-02-03 DIAGNOSIS — Z7984 Long term (current) use of oral hypoglycemic drugs: Secondary | ICD-10-CM | POA: Insufficient documentation

## 2023-02-03 DIAGNOSIS — Z9221 Personal history of antineoplastic chemotherapy: Secondary | ICD-10-CM | POA: Diagnosis not present

## 2023-02-03 DIAGNOSIS — M5137 Other intervertebral disc degeneration, lumbosacral region: Secondary | ICD-10-CM | POA: Insufficient documentation

## 2023-02-03 DIAGNOSIS — Z8614 Personal history of Methicillin resistant Staphylococcus aureus infection: Secondary | ICD-10-CM | POA: Diagnosis not present

## 2023-02-03 DIAGNOSIS — Z933 Colostomy status: Secondary | ICD-10-CM | POA: Insufficient documentation

## 2023-02-03 DIAGNOSIS — Z923 Personal history of irradiation: Secondary | ICD-10-CM | POA: Diagnosis not present

## 2023-02-03 DIAGNOSIS — M25511 Pain in right shoulder: Secondary | ICD-10-CM | POA: Insufficient documentation

## 2023-02-03 DIAGNOSIS — G4700 Insomnia, unspecified: Secondary | ICD-10-CM | POA: Diagnosis not present

## 2023-02-03 DIAGNOSIS — Z87442 Personal history of urinary calculi: Secondary | ICD-10-CM | POA: Diagnosis not present

## 2023-02-03 DIAGNOSIS — C3411 Malignant neoplasm of upper lobe, right bronchus or lung: Secondary | ICD-10-CM | POA: Insufficient documentation

## 2023-02-03 DIAGNOSIS — I10 Essential (primary) hypertension: Secondary | ICD-10-CM | POA: Insufficient documentation

## 2023-02-03 DIAGNOSIS — J432 Centrilobular emphysema: Secondary | ICD-10-CM | POA: Diagnosis not present

## 2023-02-03 DIAGNOSIS — I959 Hypotension, unspecified: Secondary | ICD-10-CM | POA: Diagnosis not present

## 2023-02-03 DIAGNOSIS — M5136 Other intervertebral disc degeneration, lumbar region: Secondary | ICD-10-CM | POA: Diagnosis not present

## 2023-02-03 DIAGNOSIS — E860 Dehydration: Secondary | ICD-10-CM | POA: Diagnosis not present

## 2023-02-03 DIAGNOSIS — E1165 Type 2 diabetes mellitus with hyperglycemia: Secondary | ICD-10-CM | POA: Diagnosis not present

## 2023-02-03 DIAGNOSIS — Z5112 Encounter for antineoplastic immunotherapy: Secondary | ICD-10-CM | POA: Insufficient documentation

## 2023-02-03 MED ORDER — SODIUM CHLORIDE 0.9% FLUSH
10.0000 mL | Freq: Once | INTRAVENOUS | Status: AC | PRN
  Administered 2023-02-03: 10 mL
  Filled 2023-02-03: qty 10

## 2023-02-03 MED ORDER — SODIUM CHLORIDE 0.9 % IV SOLN
Freq: Once | INTRAVENOUS | Status: AC
  Filled 2023-02-03: qty 250

## 2023-02-03 MED ORDER — HEPARIN SOD (PORK) LOCK FLUSH 100 UNIT/ML IV SOLN
500.0000 [IU] | Freq: Once | INTRAVENOUS | Status: AC | PRN
  Administered 2023-02-03: 500 [IU]
  Filled 2023-02-03: qty 5

## 2023-02-07 MED FILL — Iron Sucrose Inj 20 MG/ML (Fe Equiv): INTRAVENOUS | Qty: 10 | Status: AC

## 2023-02-08 ENCOUNTER — Inpatient Hospital Stay (HOSPITAL_BASED_OUTPATIENT_CLINIC_OR_DEPARTMENT_OTHER): Payer: Medicare PPO | Admitting: Hospice and Palliative Medicine

## 2023-02-08 ENCOUNTER — Inpatient Hospital Stay: Payer: Medicare PPO

## 2023-02-08 ENCOUNTER — Other Ambulatory Visit: Payer: Self-pay

## 2023-02-08 ENCOUNTER — Encounter: Payer: Self-pay | Admitting: Hospice and Palliative Medicine

## 2023-02-08 VITALS — BP 133/79 | HR 92 | Temp 96.9°F | Resp 20 | Ht 71.0 in | Wt 166.3 lb

## 2023-02-08 DIAGNOSIS — C3411 Malignant neoplasm of upper lobe, right bronchus or lung: Secondary | ICD-10-CM

## 2023-02-08 DIAGNOSIS — E860 Dehydration: Secondary | ICD-10-CM

## 2023-02-08 DIAGNOSIS — Z5112 Encounter for antineoplastic immunotherapy: Secondary | ICD-10-CM | POA: Diagnosis not present

## 2023-02-08 DIAGNOSIS — E876 Hypokalemia: Secondary | ICD-10-CM

## 2023-02-08 LAB — CBC WITH DIFFERENTIAL (CANCER CENTER ONLY)
Abs Immature Granulocytes: 0.01 10*3/uL (ref 0.00–0.07)
Basophils Absolute: 0 10*3/uL (ref 0.0–0.1)
Basophils Relative: 0 %
Eosinophils Absolute: 0.2 10*3/uL (ref 0.0–0.5)
Eosinophils Relative: 4 %
HCT: 35.3 % — ABNORMAL LOW (ref 39.0–52.0)
Hemoglobin: 11.3 g/dL — ABNORMAL LOW (ref 13.0–17.0)
Immature Granulocytes: 0 %
Lymphocytes Relative: 15 %
Lymphs Abs: 0.8 10*3/uL (ref 0.7–4.0)
MCH: 28 pg (ref 26.0–34.0)
MCHC: 32 g/dL (ref 30.0–36.0)
MCV: 87.6 fL (ref 80.0–100.0)
Monocytes Absolute: 0.5 10*3/uL (ref 0.1–1.0)
Monocytes Relative: 9 %
Neutro Abs: 3.8 10*3/uL (ref 1.7–7.7)
Neutrophils Relative %: 72 %
Platelet Count: 188 10*3/uL (ref 150–400)
RBC: 4.03 MIL/uL — ABNORMAL LOW (ref 4.22–5.81)
RDW: 14.5 % (ref 11.5–15.5)
WBC Count: 5.2 10*3/uL (ref 4.0–10.5)
nRBC: 0 % (ref 0.0–0.2)

## 2023-02-08 LAB — BASIC METABOLIC PANEL - CANCER CENTER ONLY
Anion gap: 9 (ref 5–15)
BUN: 8 mg/dL (ref 8–23)
CO2: 29 mmol/L (ref 22–32)
Calcium: 8.9 mg/dL (ref 8.9–10.3)
Chloride: 100 mmol/L (ref 98–111)
Creatinine: 1.07 mg/dL (ref 0.61–1.24)
GFR, Estimated: >60 mL/min (ref >60)
Glucose, Bld: 183 mg/dL — ABNORMAL HIGH (ref 70–99)
Potassium: 3.3 mmol/L — ABNORMAL LOW (ref 3.5–5.1)
Sodium: 138 mmol/L (ref 135–145)

## 2023-02-08 LAB — CORTISOL-AM, BLOOD: Cortisol - AM: 9.8 ug/dL (ref 6.7–22.6)

## 2023-02-08 MED ORDER — POTASSIUM CHLORIDE 20 MEQ/100ML IV SOLN
20.0000 meq | Freq: Once | INTRAVENOUS | Status: AC
  Administered 2023-02-08: 20 meq via INTRAVENOUS

## 2023-02-08 MED ORDER — HEPARIN SOD (PORK) LOCK FLUSH 100 UNIT/ML IV SOLN
500.0000 [IU] | Freq: Once | INTRAVENOUS | Status: AC | PRN
  Administered 2023-02-08: 500 [IU]
  Filled 2023-02-08: qty 5

## 2023-02-08 MED ORDER — SODIUM CHLORIDE 0.9% FLUSH
10.0000 mL | Freq: Once | INTRAVENOUS | Status: AC | PRN
  Administered 2023-02-08: 10 mL
  Filled 2023-02-08: qty 10

## 2023-02-08 MED ORDER — SODIUM CHLORIDE 0.9 % IV SOLN
Freq: Once | INTRAVENOUS | Status: AC
  Filled 2023-02-08: qty 250

## 2023-02-08 MED ORDER — FENTANYL 25 MCG/HR TD PT72: 1.0000 | MEDICATED_PATCH | TRANSDERMAL | 0 refills | Status: DC

## 2023-02-08 NOTE — Progress Notes (Addendum)
Symptom Management Clinic [facility]  Telephone:([phone] [phone]  Patient Care Team: [name], MD as PCP - General (Family Medicine) [name], RN as Oncology Nurse Navigator Orson Ape, MD as Consulting Physician (Urology) [name], MD as Consulting Physician (Oncology) [name], MD as Consulting Physician (Pulmonary Disease)   Name of the patient: Rodney Vega  [id number]  [birth year]   Date of visit: 02/08/23  Reason for Consult: Rodney Vega is a 67 year old man with multiple medical problems including recurrent locally advanced non-small cell lung cancer status post chemotherapy and radiation.  Most recently on immunotherapy.  Interval history: Patient was hospitalized 01/20/2023 to 01/21/2023 with hypotension likely secondary to dehydration with recent GI illness.  Patient has had extreme fatigue secondary to treatment with multiple falls and intermittent hypotension and therefore patient declined further therapy but wanted to continue symptomatic management with IV fluids and supportive care.  Patient subsequently had follow-up with Dr. Donneta Romberg on 01/25/2023 with decision to forego further cancer treatment.  Patient returns to clinic today for supportive care.  Today, patient reports that he is feeling better.  He denies any acute changes or concerns.  Denies any neurologic complaints. Denies any easy bleeding or bruising.  No fever or chills. Denies chest pain. Denies any nausea, vomiting, constipation, or diarrhea.  Patient offers no further specific complaints today.  PAST MEDICAL HISTORY: Past Medical History:  Diagnosis Date   Acute on chronic respiratory failure (HCC)    Anemia    Cancer (HCC)    Colostomy present (HCC)    COPD (chronic obstructive pulmonary disease) (HCC)    Degenerative arthritis of knee    Depression    Diabetes mellitus without complication (HCC)    Diverticulitis     Dyspnea    Glaucoma    since 2004   Hernia 1979   History of kidney stones    History of methicillin resistant staphylococcus aureus (MRSA)    Hypertension    Hypokalemia    Marijuana use    Mass of cecum    Nephrolithiasis    Neuromuscular disorder (HCC)    Personal history of tobacco use, presenting hazards to health    Pneumonia    Port-A-Cath in place    Pre-diabetes    Primary cancer of right upper lobe of lung (HCC)    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)    Screening for obesity    Severe sepsis (HCC)    Special screening for malignant neoplasms, colon    Tobacco use    Wears dentures    full upper and lower    PAST SURGICAL HISTORY:  Past Surgical History:  Procedure Laterality Date   BACK SURGERY  1994, 2012   lumbar bulging disc   BRONCHOSCOPY  05/05/2022   CATARACT EXTRACTION W/PHACO Left 01/20/2021   Procedure: CATARACT EXTRACTION PHACO AND INTRAOCULAR LENS PLACEMENT (IOC) LEFT;  Surgeon: Galen Manila, MD;  Location: [facility];  Service: Ophthalmology;  Laterality: Left;  10.13 0:52.1   COLONOSCOPY  [id number]   [facility]/Dr. Carmell Austria sessile polyp,110mm, found in rectum,multiple diverticula were found in the sigmoid, in descending and in transverse colon   COLONOSCOPY WITH PROPOFOL N/A 10/22/2021   Procedure: COLONOSCOPY WITH PROPOFOL;  Surgeon: Jaynie Collins, DO;  Location: [facility];  Service: Gastroenterology;  Laterality: N/A;  DM   COLOSTOMY  04/20/2013   EYE SURGERY     HERNIA REPAIR  1979   inguinal   IR IMAGING  GUIDED PORT INSERTION  06/10/2021   JOINT REPLACEMENT Right    knee   KNEE ARTHROPLASTY Right 05/08/2018   Procedure: COMPUTER ASSISTED TOTAL KNEE ARTHROPLASTY;  Surgeon: Donato Heinz, MD;  Location: [facility];  Service: Orthopedics;  Laterality: Right;   KNEE ARTHROPLASTY Left 11/16/2019   Procedure: COMPUTER ASSISTED TOTAL KNEE ARTHROPLASTY;  Surgeon: Donato Heinz, MD;  Location: [facility];  Service:  Orthopedics;  Laterality: Left;   LAPAROTOMY  04/20/2013   colon resection with colosotmy   LITHOTRIPSY  2013   LUNG BIOPSY  2023   RIGID BRONCHOSCOPY N/A 10/29/2022   Procedure: RIGID BRONCHOSCOPY;  Surgeon: [name], MD;  Location: [facility];  Service: Thoracic;  Laterality: N/A;   TONSILLECTOMY AND ADENOIDECTOMY  1962   VIDEO BRONCHOSCOPY WITH ENDOBRONCHIAL NAVIGATION N/A 05/05/2022   Procedure: VIDEO BRONCHOSCOPY WITH ENDOBRONCHIAL NAVIGATION;  Surgeon: [name], MD;  Location: [facility];  Service: Thoracic;  Laterality: N/A;   VIDEO BRONCHOSCOPY WITH ENDOBRONCHIAL ULTRASOUND N/A 05/05/2022   Procedure: VIDEO BRONCHOSCOPY WITH ENDOBRONCHIAL ULTRASOUND;  Surgeon: [name], MD;  Location: [facility];  Service: Thoracic;  Laterality: N/A;   VIDEO BRONCHOSCOPY WITH ENDOBRONCHIAL ULTRASOUND N/A 10/29/2022   Procedure: VIDEO BRONCHOSCOPY WITH ENDOBRONCHIAL ULTRASOUND;  Surgeon: [name], MD;  Location: [facility];  Service: Thoracic;  Laterality: N/A;    HEMATOLOGY/ONCOLOGY HISTORY:  Oncology History Overview Note  AUG 2022- 8.2 x 5.9 cm spiculated mass noted in right upper lobe consistent with malignancy. It extends from the right hilum to the lateral chest wall and results in lytic destruction of the lateral portion of the right fourth rib.  Mediastinal lymph nodes are noted, including 12 mm right hilar lymph node, consistent with metastatic disease. 3 cm right adrenal mass is noted concerning for metastatic disease.  # AUG 2022- PET IMPRESSION: Peripherally hypermetabolic right upper lobe mass, likely due to central necrosis. Mass extends into the right hilum and right lateral chest wall involving the second through fourth right ribs.   Mildly enlarged and hypermetabolic right hilar lymph node.   No evidence of metastatic disease in the abdomen or pelvis.  # AUG, 2022-RIGHT CHEST WALL Bx-necrosis; suspicious for malignancy not definitive [discussed with  Dr.Kraynie;MDT] LUNG MASS, RIGHT; BIOPSY:  - SUSPICIOUS FOR MALIGNANCY.  - SEE COMMENT.   Comment:  Approximately six tissue cores demonstrate inflamed fibrous capsule with  hemosiderin-laden macrophages, in a background of abundant necrosis.  One of the cores demonstrates a minute fragment of viable epithelial  cells.  These cells are positive for p40.  They are negative for TTF1  and CK7. The cells of interest disappear on deeper cut levels.  While  the findings are suspicious for squamous cell carcinoma, there is not  enough viable tumor for a definitive diagnosis.  Additional tissue  sampling may be helpful.   # SEP 2022-cycle #2 Taxol hypersensitive reaction.  Switched over to #3 cycle- carbo-Abraxane starting 06/29/2021.  Discontinued IMFINZI therapy-December 2022 given poor tolerance/pneumonia admission to hospital.  #  [name] was in May-June 2023 x3 doses; stopped because of intolerance-fatigue patient preference/lack of progression.  # JAN 2024-bronchoscopy biopsy negative for malignancy; however PET scan concerning for recurrence/progression.    May 05, 2022-s/p bronchoscopy- with Dr.Aleskerov-significant purulent/necrotic debris noted. Dec 23rd, 2023- PET scan: The cavitary process in the right upper lobe is similar in size to previous CTS, although demonstrates prominent peripheral hypermetabolic activity. 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 Hypermetabolic activity within the adjacent upper right lateral chest wall with right 3rd through 5th rib deformities, suspicious for direct tumor spread. No evidence of distant osseous metastatic disease; Hypermetabolic left hilar lymph node worrisome for metastatic disease. No other hypermetabolic lymph nodes or distant metastases identified.  Bronchoscopy/EBUS [JAN 26th, 2024- Dr.A]- BAL/ 4R-lymph node FNA  negative for malignancy.  Patient declined further invasive procedures.  # FEB 15t, 2024- RE-Start Keytruda.   # DEC 2022- s/p ID- Dr.ravishankar- ? Lung abscess-no antibiotics-  JAN 2023- CT scan incidental- cecal mass- colonoscopy[Dr.Russo; [facility]- high grade dysplasia]  S/p evaluation with Dr. Sherryle Lis surgery for now]   # S/p evaluation with Dr. Sheppard Penton.   Primary cancer of right upper lobe of lung (HCC)  06/03/2021 Initial Diagnosis   Primary cancer of right upper lobe of lung (HCC)   06/03/2021 Cancer Staging   Staging form: Lung, AJCC 8th Edition - Clinical: Stage IIIB (cT3, cN2, cM0) - Signed by [name], MD on 06/03/2021   06/15/2021 - 08/03/2021 Chemotherapy   Patient is on Treatment Plan : LUNG Carboplatin / Paclitaxel + XRT q7d     10/02/2021 - 10/19/2021 Chemotherapy   Patient is on Treatment Plan : LUNG Durvalumab q14d     02/03/2022 -  Chemotherapy   Patient is on Treatment Plan : LUNG NSCLC Pembrolizumab (200) q21d     02/04/2022 - 03/18/2022 Chemotherapy   Patient is on Treatment Plan : LUNG NSCLC Pembrolizumab (200) q21d       ALLERGIES:  is allergic to paclitaxel, ambien [zolpidem], nsaids, and aleve [naproxen sodium].  MEDICATIONS:  Current Outpatient Medications  Medication Sig Dispense Refill   Accu-Chek Softclix Lancets lancets USE AS DIRECTED FOUR TIMES DAILY 100 each 12   acetaminophen (TYLENOL) 500 MG tablet Take 2 tablets (1,000 mg total) by mouth daily as needed. Home med. 30 tablet 0   albuterol (PROVENTIL) (2.5 MG/3ML) 0.083% nebulizer solution Take 2.5 mg by nebulization every 4 (four) hours as needed for wheezing or shortness of breath.     albuterol (VENTOLIN HFA) 108 (90 Base) MCG/ACT inhaler SMARTSIG:2 inhalation Via Inhaler Every 6 Hours PRN 1 each 2   blood glucose meter kit and supplies KIT Check your blood glucose levels twice a day; once in the morning before breakfast; and once in the evening after dinner. 1 each 0   BREZTRI  AEROSPHERE 160-9-4.8 MCG/ACT AERO Inhale 2 puffs into the lungs 2 (two) times daily.     dextromethorphan-guaiFENesin (MUCINEX DM) 30-600 MG 12hr tablet Take 1 tablet by mouth 2 (two) times daily as needed for cough.     dorzolamide-timolol (COSOPT) 22.3-6.8 MG/ML ophthalmic solution Place 1 drop into both eyes 2 (two) times daily.     eszopiclone (LUNESTA) 1 MG TABS tablet TAKE 1 TABLET(1 MG) BY MOUTH AT BEDTIME AS NEEDED FOR SLEEP 30 tablet 1   fentaNYL (DURAGESIC) 25 MCG/HR Place 1 patch onto the skin every 3 (three) days. 10 patch 0   glipiZIDE (GLUCOTROL) 5 MG tablet TAKE 1 TABLET(5 MG) BY MOUTH DAILY BEFORE BREAKFAST 90 tablet 2   glucose blood test strip Check your blood glucose levels twice a day; once in the morning before breakfast; and once in the evening after dinner. 100 each 12   ipratropium-albuterol (DUONEB) 0.5-2.5 (3) MG/3ML SOLN Take 3 mLs by nebulization every 6 (six) hours as needed. 360 mL 1   latanoprost (XALATAN) 0.005 % ophthalmic solution Place 1 drop into both eyes at  bedtime.     lidocaine-prilocaine (EMLA) cream Apply 1 Application topically as needed. 30 g 3   metFORMIN (GLUCOPHAGE) 500 MG tablet Take 1 tablet (500 mg total) by mouth 2 (two) times daily with a meal. 60 tablet 2   montelukast (SINGULAIR) 10 MG tablet TAKE 1 TABLET(10 MG) BY MOUTH AT BEDTIME 90 tablet 0   Multiple Vitamin (MULTIVITAMIN WITH MINERALS) TABS tablet Take 1 tablet by mouth daily.     naloxone (NARCAN) nasal spray 4 mg/0.1 mL Place 1 spray into the nose once.     oxyCODONE (OXY IR/ROXICODONE) 5 MG immediate release tablet Take 1 tablet (5 mg total) by mouth every 8 (eight) hours as needed for severe pain. 90 tablet 0   pregabalin (LYRICA) 50 MG capsule Take 1 capsule (50 mg total) by mouth 2 (two) times daily. 60 capsule 2   prochlorperazine (COMPAZINE) 10 MG tablet Take 1 tablet (10 mg total) by mouth every 6 (six) hours as needed for nausea or vomiting. 30 tablet 1   propranolol ER (INDERAL  LA) 60 MG 24 hr capsule TAKE 1 CAPSULE(60 MG) BY MOUTH DAILY 30 capsule 3   Respiratory Therapy Supplies (NEBULIZER) DEVI Use as directed 1 each 0   tamsulosin (FLOMAX) 0.4 MG CAPS capsule Take 0.4 mg by mouth daily after supper.     triamcinolone ointment (KENALOG) 0.5 % Apply 1 Application topically 2 (two) times daily. 30 g 1   venlafaxine XR (EFFEXOR-XR) 75 MG 24 hr capsule Take 225 mg by mouth daily with breakfast.     vitamin B-12 (CYANOCOBALAMIN) 500 MCG tablet Take 1 tablet by mouth daily.     olmesartan (BENICAR) 40 MG tablet Take 1 tablet (40 mg total) by mouth every morning. (Patient not taking: Reported on 01/25/2023) 30 tablet 3   No current facility-administered medications for this visit.   Facility-Administered Medications Ordered in Other Visits  Medication Dose Route Frequency Provider Last Rate Last Admin   0.9 %  sodium chloride infusion   Intravenous Once Louretta Shorten R, MD       heparin lock flush 100 UNIT/ML injection            heparin lock flush 100 UNIT/ML injection            heparin lock flush 100 UNIT/ML injection            heparin lock flush 100 unit/mL  500 Units Intracatheter Once PRN [name], MD        VITAL SIGNS: BP 133/79   Pulse 92   Temp (!) 96.9 F (36.1 C) (Tympanic)   Resp 20   Ht 5\' 11"  (1.803 m)   Wt 166 lb 4.8 oz (75.4 kg)   BMI 23.19 kg/m  Filed Weights   02/08/23 0953  Weight: 166 lb 4.8 oz (75.4 kg)     Estimated body mass index is 23.19 kg/m as calculated from the following:   Height as of this encounter: 5\' 11"  (1.803 m).   Weight as of this encounter: 166 lb 4.8 oz (75.4 kg).  LABS: CBC:    Component Value Date/Time   WBC 5.2 02/08/2023 0950   WBC 7.1 01/25/2023 0939   HGB 11.3 (L) 02/08/2023 0950   HGB 7.5 (L) 12/01/2021 0504   HCT 35.3 (L) 02/08/2023 0950   HCT 23.7 (L) 12/01/2021 0504   PLT 188 02/08/2023 0950   PLT 243 12/01/2021 0504   MCV 87.6 02/08/2023 0950   MCV 89 12/01/2021 0504  MCV 91 09/21/2013 0522   NEUTROABS 3.8 02/08/2023 0950   NEUTROABS 9.6 (H) 12/01/2021 0504   NEUTROABS 11.3 (H) 09/21/2013 0522   LYMPHSABS 0.8 02/08/2023 0950   LYMPHSABS 0.8 12/01/2021 0504   LYMPHSABS 1.4 09/21/2013 0522   MONOABS 0.5 02/08/2023 0950   MONOABS 0.9 09/21/2013 0522   EOSABS 0.2 02/08/2023 0950   EOSABS 0.3 12/01/2021 0504   EOSABS 0.0 09/21/2013 0522   BASOSABS 0.0 02/08/2023 0950   BASOSABS 0.0 12/01/2021 0504   BASOSABS 0.1 09/21/2013 0522   Comprehensive Metabolic Panel:    Component Value Date/Time   NA 137 01/25/2023 0939   NA 138 09/20/2013 0639   K 3.1 (L) 01/25/2023 0939   K 4.0 09/20/2013 0639   CL 100 01/25/2023 0939   CL 107 09/20/2013 0639   CO2 27 01/25/2023 0939   CO2 26 09/20/2013 0639   BUN 10 01/25/2023 0939   BUN 12 09/20/2013 0639   CREATININE 1.03 01/25/2023 0939   CREATININE 1.73 (H) 01/20/2023 1044   CREATININE 0.84 09/24/2013 0515   GLUCOSE 142 (H) 01/25/2023 0939   GLUCOSE 160 (H) 09/20/2013 0639   CALCIUM 8.4 (L) 01/25/2023 0939   CALCIUM 8.2 (L) 09/20/2013 0639   AST 15 01/25/2023 0939   AST 14 (L) 01/20/2023 1044   ALT 9 01/25/2023 0939   ALT 7 01/20/2023 1044   ALT 20 01/08/2012 0939   ALKPHOS 95 01/25/2023 0939   ALKPHOS 69 01/08/2012 0939   BILITOT 0.3 01/25/2023 0939   BILITOT 0.5 01/20/2023 1044   PROT 6.9 01/25/2023 0939   PROT 6.5 09/12/2013 1002   PROT 6.9 01/08/2012 0939   ALBUMIN 3.4 (L) 01/25/2023 0939   ALBUMIN 4.4 09/12/2013 1002   ALBUMIN 3.5 01/08/2012 0939    RADIOGRAPHIC STUDIES: DG Lumbar Spine 2-3 Views  Result Date: 01/20/2023 CLINICAL DATA:  Back pain after multiple falls. EXAM: LUMBAR SPINE - 2-3 VIEW COMPARISON:  July 06, 2022. FINDINGS: Mild compression deformity of L2 vertebral body is noted which is stable compared to prior exam. No acute fracture or spondylolisthesis is noted. Mild degenerative disc disease is noted at L1-2 and L2-3. Moderate degenerative disc disease is noted at L4-5 and  L5-S1. IMPRESSION: Multilevel degenerative disc disease.  No acute abnormality seen. Electronically Signed   By: Lupita Raider M.D.   On: 01/20/2023 18:20   DG Chest Port 1 View  Result Date: 01/20/2023 CLINICAL DATA:  Lung cancer patient EXAM: PORTABLE CHEST 1 VIEW COMPARISON:  None Available. FINDINGS: Port in the anterior chest wall with tip in distal SVC. Volume loss in the RIGHT hemithorax. There is pleural fluid at the RIGHT lung apex with focus of cavitation not changed from comparison exam. LEFT lung clear. No acute osseous abnormality. IMPRESSION: 1. No interval change. 2. Pleural fluid and cavitation in the RIGHT upper lobe. Electronically Signed   By: Genevive Bi M.D.   On: 01/20/2023 15:13   CT CHEST ABDOMEN PELVIS W CONTRAST  Result Date: 01/17/2023 CLINICAL DATA:  Non-small-cell lung cancer. Assess treatment response. Chemotherapy and radiation. * Tracking Code: BO * EXAM: CT CHEST, ABDOMEN, AND PELVIS WITH CONTRAST TECHNIQUE: Multidetector CT imaging of the chest, abdomen and pelvis was performed following the standard protocol during bolus administration of intravenous contrast. RADIATION DOSE REDUCTION: This exam was performed according to the departmental dose-optimization program which includes automated exposure control, adjustment of the mA and/or kV according to patient size and/or use of iterative reconstruction technique. CONTRAST:  OMNIPAQUE IOHEXOL  300 MG/ML  SOLN COMPARISON:  PET-CT 09/22/2022.  Standard CT 07/06/2022 FINDINGS: CT CHEST FINDINGS Cardiovascular: Left upper chest port. Normal caliber thoracic aorta with some vascular calcifications. Coronary artery calcifications are seen. No significant pericardial effusion. The heart is nonenlarged. Mediastinum/Nodes: Preserved esophagus. Stable thyroid gland. No specific abnormal lymph node enlargement present in the axillary regions or hilum. There are some small less than 1 cm in size in short axis mediastinal  nodes, not pathologic by size criteria. The exception was in the left hilum. Previously there was a hypermetabolic node in this location. On the prior CT scan this would have measured in retrospect 2.7 x 1.3 cm. Today residual tissue in this location measures 11 x 11 mm on series 2, image 27. Previously this may have been 2 adjacent nodes. Lungs/Pleura: Left lung has some paraseptal and centrilobular emphysematous changes. Chronic interstitial changes are seen as well. There is some subtle areas of ground-glass type nodular areas. This has increased along the posteroinferior aspect of the left upper lobe on series 2, image 27 with area measuring a proximally 18 x 12 mm. Additional 5 mm area posterior left lower lobe on series 2, image 40. Previously there were similar areas in the inferior aspect of the left lower lobe which have improved. In addition the nodular area of opacity in the medial left upper lobe on the prior has shown some improvement as well. The right lung has areas of interstitial septal thickening particularly towards the upper lung zones with volume loss and air bronchograms with distortion. There is also a cavitary area in the right lung apex which appears to communicate with central bronchi on axial image 21 of series 2 with nodular pleural thickening. On PET-CT this area was hypermetabolic. Thickness of the area laterally on series 2, image 20 today measures a proximally 2.6 cm. Going back to the prior exam when measured in a similar fashion this measured 2.5 cm. The adjacent portion of the right third and fourth ribs have rib destruction. There is also a fracture of the fifth rib laterally which was seen previously. Musculoskeletal: The pleural lesion in the right upper lobe does extend through to the adjacent ribs and chest wall laterally with some stranding with thickening. Degenerative changes along the spine. CT ABDOMEN PELVIS FINDINGS Hepatobiliary: Fatty liver infiltration. No  space-occupying liver lesion. The gallbladder is nondilated. Patent portal vein. Pancreas: Unremarkable. No pancreatic ductal dilatation or surrounding inflammatory changes. Spleen: Normal in size without focal abnormality. Adrenals/Urinary Tract: Right adrenal nodule identified once again measuring 2.7 cm in maximal dimension, unchanged. Previously this is felt to be an adenoma on prior workup. There is also some focal thickening of the left adrenal gland which is stable as well. No enhancing renal mass. Extrarenal pelvis seen of the left kidney with multiple stones. Component of chronic UPJ. Preserved contours of the urinary bladder. Stomach/Bowel: Stomach is distended with contrast and debris. Small bowel is nondilated. There is a left lower quadrant colostomy. Short rectosigmoid stump. Proximal colon has scattered stool. There is a possible mass seen at the base of the cecum with a dilated appendix. This was seen previously. The level of appendiceal dilatation is slightly increasing. Please correlate for colon carcinoma with a obstructing component to the appendix. Vascular/Lymphatic: Normal caliber IVC. Scattered vascular calcifications. There are areas of stenosis suggested including at the celiac axis and SMA. Focal saccular dilatation of the infrarenal abdominal aorta with diameter including the sacculation of up to 2.7 cm. Reproductive: Prostate is  unremarkable. Other: No free air or free fluid. Musculoskeletal: Curvature of the spine with some degenerative changes. IMPRESSION: Once again there is a large right apical cavitary area with nodular wall thickening. Extension through the chest wall laterally with erosion of adjacent ribs and pathologic fracture. The cavitary lesion does appear to communicate with central bronchi. Changing patchy ground-glass ill-defined lung opacities. Recommend continued follow-up. Underlying emphysematous lung changes. Decreasing hypermetabolic left hilar lymph nodes. No new  lymph node enlargement. Fatty liver infiltration. Once again there is a mass lesion identified along the cecum obstructing the orifice of the appendix. This is worrisome for a neoplasm. Again recommend further workup when clinically appropriate. UPJ changes of the left kidney with multiple nonobstructing renal stones. Adrenal adenomas. 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 Electronically Signed   By: Karen Kays M.D.   On: 01/17/2023 17:55    PERFORMANCE STATUS (ECOG) : 1 - Symptomatic but completely ambulatory  Review of Systems Unless otherwise noted, a complete review of systems is negative.  Physical Exam General: NAD Cardiac: RRR Pulmonary: Chronically coarse to auscultation anterior/posterior fields, expiratory wheezing Abdomen: Soft, nontender to palp Extremities: no edema, no joint deformities Skin: no rashes Neurological: Grossly nonfocal  Assessment and Plan: Unresectable/locally advanced non-small cell lung cancer -recent decision to forego Keytruda.  Discussed with patient and wife today and they recognize that they would likely face future conversations regarding need for comfort and/or hospice with inevitable disease progression whenever that may occur.  For now, they would like to continue intermittent supportive care and IV fluids.   Dehydration/hypokalemia-IV fluids and replete IV K today  Neoplasm pain -refill fentanyl.  Continue as needed oxycodone for breakthrough pain  Follow-up on Thursday for labs/IV fluids  Patient expressed understanding and was in agreement with this plan. He also understands that He can call clinic at any time with any questions, concerns, or complaints.   Thank you for allowing me to participate in the care of this very pleasant patient.   Time Total: 25 minutes  Visit consisted of counseling and education dealing with the complex and emotionally intense issues of symptom management in the setting of serious  illness.Greater than 50%  of this time was spent counseling and coordinating care related to the above assessment and plan.  Signed by: Laurette Schimke, PhD, NP-C

## 2023-02-10 ENCOUNTER — Inpatient Hospital Stay: Payer: Medicare PPO

## 2023-02-10 ENCOUNTER — Other Ambulatory Visit: Payer: Self-pay | Admitting: Internal Medicine

## 2023-02-10 ENCOUNTER — Inpatient Hospital Stay: Payer: Medicare PPO | Admitting: Hospice and Palliative Medicine

## 2023-02-10 VITALS — BP 157/78 | HR 78 | Temp 97.8°F | Resp 20 | Wt 162.1 lb

## 2023-02-10 DIAGNOSIS — Z5112 Encounter for antineoplastic immunotherapy: Secondary | ICD-10-CM | POA: Diagnosis not present

## 2023-02-10 DIAGNOSIS — C3411 Malignant neoplasm of upper lobe, right bronchus or lung: Secondary | ICD-10-CM

## 2023-02-10 MED ORDER — SODIUM CHLORIDE 0.9 % IV SOLN
Freq: Once | INTRAVENOUS | Status: AC
  Filled 2023-02-10: qty 250

## 2023-02-10 MED ORDER — HEPARIN SOD (PORK) LOCK FLUSH 100 UNIT/ML IV SOLN
500.0000 [IU] | Freq: Once | INTRAVENOUS | Status: AC | PRN
  Administered 2023-02-10: 500 [IU]
  Filled 2023-02-10: qty 5

## 2023-02-10 MED ORDER — SODIUM CHLORIDE 0.9% FLUSH
10.0000 mL | Freq: Once | INTRAVENOUS | Status: AC | PRN
  Administered 2023-02-10: 10 mL
  Filled 2023-02-10: qty 10

## 2023-02-15 ENCOUNTER — Inpatient Hospital Stay: Payer: Medicare PPO

## 2023-02-15 VITALS — BP 133/79 | HR 90 | Temp 95.9°F | Resp 20 | Wt 162.1 lb

## 2023-02-15 DIAGNOSIS — C3411 Malignant neoplasm of upper lobe, right bronchus or lung: Secondary | ICD-10-CM

## 2023-02-15 DIAGNOSIS — Z5112 Encounter for antineoplastic immunotherapy: Secondary | ICD-10-CM | POA: Diagnosis not present

## 2023-02-15 MED ORDER — SODIUM CHLORIDE 0.9 % IV SOLN
Freq: Once | INTRAVENOUS | Status: AC
  Filled 2023-02-15: qty 250

## 2023-02-15 MED ORDER — HEPARIN SOD (PORK) LOCK FLUSH 100 UNIT/ML IV SOLN
500.0000 [IU] | Freq: Once | INTRAVENOUS | Status: AC | PRN
  Administered 2023-02-15: 500 [IU]
  Filled 2023-02-15: qty 5

## 2023-02-15 MED ORDER — SODIUM CHLORIDE 0.9% FLUSH
10.0000 mL | Freq: Once | INTRAVENOUS | Status: AC | PRN
  Administered 2023-02-15: 10 mL
  Filled 2023-02-15: qty 10

## 2023-02-17 ENCOUNTER — Inpatient Hospital Stay: Payer: Medicare PPO

## 2023-02-17 VITALS — BP 126/77 | HR 89 | Temp 97.2°F | Resp 20 | Wt 163.0 lb

## 2023-02-17 DIAGNOSIS — Z5112 Encounter for antineoplastic immunotherapy: Secondary | ICD-10-CM | POA: Diagnosis not present

## 2023-02-17 DIAGNOSIS — C3411 Malignant neoplasm of upper lobe, right bronchus or lung: Secondary | ICD-10-CM

## 2023-02-17 MED ORDER — SODIUM CHLORIDE 0.9% FLUSH
10.0000 mL | Freq: Once | INTRAVENOUS | Status: AC | PRN
  Administered 2023-02-17: 10 mL
  Filled 2023-02-17: qty 10

## 2023-02-17 MED ORDER — HEPARIN SOD (PORK) LOCK FLUSH 100 UNIT/ML IV SOLN
500.0000 [IU] | Freq: Once | INTRAVENOUS | Status: AC | PRN
  Administered 2023-02-17: 500 [IU]
  Filled 2023-02-17: qty 5

## 2023-02-17 MED ORDER — SODIUM CHLORIDE 0.9 % IV SOLN
Freq: Once | INTRAVENOUS | Status: AC
  Filled 2023-02-17: qty 250

## 2023-02-22 ENCOUNTER — Inpatient Hospital Stay: Payer: Medicare PPO

## 2023-02-22 ENCOUNTER — Inpatient Hospital Stay (HOSPITAL_BASED_OUTPATIENT_CLINIC_OR_DEPARTMENT_OTHER): Payer: Medicare PPO | Admitting: Internal Medicine

## 2023-02-22 VITALS — BP 123/80 | HR 93 | Temp 96.7°F | Wt 162.3 lb

## 2023-02-22 DIAGNOSIS — Z95828 Presence of other vascular implants and grafts: Secondary | ICD-10-CM

## 2023-02-22 DIAGNOSIS — C3411 Malignant neoplasm of upper lobe, right bronchus or lung: Secondary | ICD-10-CM

## 2023-02-22 DIAGNOSIS — Z5112 Encounter for antineoplastic immunotherapy: Secondary | ICD-10-CM | POA: Diagnosis not present

## 2023-02-22 LAB — CMP (CANCER CENTER ONLY)
ALT: 6 U/L (ref 0–44)
AST: 23 U/L (ref 15–41)
Albumin: 3.5 g/dL (ref 3.5–5.0)
Alkaline Phosphatase: 101 U/L (ref 38–126)
Anion gap: 13 (ref 5–15)
BUN: 10 mg/dL (ref 8–23)
CO2: 26 mmol/L (ref 22–32)
Calcium: 8.8 mg/dL — ABNORMAL LOW (ref 8.9–10.3)
Chloride: 99 mmol/L (ref 98–111)
Creatinine: 1.08 mg/dL (ref 0.61–1.24)
GFR, Estimated: >60 mL/min (ref >60)
Glucose, Bld: 186 mg/dL — ABNORMAL HIGH (ref 70–99)
Potassium: 3.3 mmol/L — ABNORMAL LOW (ref 3.5–5.1)
Sodium: 138 mmol/L (ref 135–145)
Total Bilirubin: 0.3 mg/dL (ref 0.3–1.2)
Total Protein: 7 g/dL (ref 6.5–8.1)

## 2023-02-22 LAB — CBC WITH DIFFERENTIAL (CANCER CENTER ONLY)
Abs Immature Granulocytes: 0.01 10*3/uL (ref 0.00–0.07)
Basophils Absolute: 0 10*3/uL (ref 0.0–0.1)
Basophils Relative: 1 %
Eosinophils Absolute: 0.2 10*3/uL (ref 0.0–0.5)
Eosinophils Relative: 4 %
HCT: 36.2 % — ABNORMAL LOW (ref 39.0–52.0)
Hemoglobin: 11.6 g/dL — ABNORMAL LOW (ref 13.0–17.0)
Immature Granulocytes: 0 %
Lymphocytes Relative: 15 %
Lymphs Abs: 0.9 10*3/uL (ref 0.7–4.0)
MCH: 28.2 pg (ref 26.0–34.0)
MCHC: 32 g/dL (ref 30.0–36.0)
MCV: 87.9 fL (ref 80.0–100.0)
Monocytes Absolute: 0.5 10*3/uL (ref 0.1–1.0)
Monocytes Relative: 8 %
Neutro Abs: 4.4 10*3/uL (ref 1.7–7.7)
Neutrophils Relative %: 72 %
Platelet Count: 196 10*3/uL (ref 150–400)
RBC: 4.12 MIL/uL — ABNORMAL LOW (ref 4.22–5.81)
RDW: 14.6 % (ref 11.5–15.5)
WBC Count: 6 10*3/uL (ref 4.0–10.5)
nRBC: 0 % (ref 0.0–0.2)

## 2023-02-22 LAB — TSH: TSH: 3.489 u[IU]/mL (ref 0.350–4.500)

## 2023-02-22 MED ORDER — POTASSIUM CHLORIDE 20 MEQ/100ML IV SOLN
20.0000 meq | Freq: Once | INTRAVENOUS | Status: AC
  Administered 2023-02-22: 20 meq via INTRAVENOUS

## 2023-02-22 MED ORDER — SODIUM CHLORIDE 0.9% FLUSH
10.0000 mL | Freq: Once | INTRAVENOUS | Status: AC
  Administered 2023-02-22: 10 mL via INTRAVENOUS
  Filled 2023-02-22: qty 10

## 2023-02-22 MED ORDER — SODIUM CHLORIDE 0.9 % IV SOLN
Freq: Once | INTRAVENOUS | Status: AC
  Filled 2023-02-22: qty 250

## 2023-02-22 MED ORDER — ESZOPICLONE 1 MG PO TABS: ORAL_TABLET | ORAL | 0 refills | Status: DC

## 2023-02-22 MED ORDER — OXYCODONE HCL 5 MG PO TABS
5.0000 mg | ORAL_TABLET | Freq: Three times a day (TID) | ORAL | 0 refills | Status: DC | PRN
Start: 2023-02-22 — End: 2023-03-22

## 2023-02-22 MED ORDER — HEPARIN SOD (PORK) LOCK FLUSH 100 UNIT/ML IV SOLN
500.0000 [IU] | Freq: Once | INTRAVENOUS | Status: AC
  Administered 2023-02-22: 500 [IU]
  Filled 2023-02-22: qty 5

## 2023-02-22 NOTE — Progress Notes (Signed)
[facility] CONSULT NOTE  Patient Care Team: [name], MD as PCP - General (Family Medicine) [name], RN as Oncology Nurse Navigator [name], [name], MD as Consulting Physician (Urology) [name], MD as Consulting Physician (Oncology) [name], MD as Consulting Physician (Pulmonary Disease)  CHIEF COMPLAINTS/PURPOSE OF CONSULTATION: lung cancer    Oncology History Overview Note  AUG 2022- 8.2 x 5.9 cm spiculated mass noted in right upper lobe consistent with malignancy. It extends from the right hilum to the lateral chest wall and results in lytic destruction of the lateral portion of the right fourth rib.  Mediastinal lymph nodes are noted, including 12 mm right hilar lymph node, consistent with metastatic disease. 3 cm right adrenal mass is noted concerning for metastatic disease.  # AUG 2022- PET IMPRESSION: Peripherally hypermetabolic right upper lobe mass, likely due to central necrosis. Mass extends into the right hilum and right lateral chest wall involving the second through fourth right ribs.   Mildly enlarged and hypermetabolic right hilar lymph node.   No evidence of metastatic disease in the abdomen or pelvis.  # AUG, 2022-RIGHT CHEST WALL Bx-necrosis; suspicious for malignancy not definitive [discussed with Dr.Kraynie;MDT] LUNG MASS, RIGHT; BIOPSY:  - SUSPICIOUS FOR MALIGNANCY.  - SEE COMMENT.   Comment:  Approximately six tissue cores demonstrate inflamed fibrous capsule with  hemosiderin-laden macrophages, in a background of abundant necrosis.  One of the cores demonstrates a minute fragment of viable epithelial  cells.  These cells are positive for p40.  They are negative for TTF1  and CK7. The cells of interest disappear on deeper cut levels.  While  the findings are suspicious for squamous cell carcinoma, there is not  enough viable tumor for a definitive diagnosis.  Additional tissue  sampling may be helpful.    # SEP 2022-cycle #2 Taxol hypersensitive reaction.  Switched over to #3 cycle- carbo-Abraxane starting 06/29/2021.  Discontinued IMFINZI therapy-December 2022 given poor tolerance/pneumonia admission to hospital.  #  [name] was in May-June 2023 x3 doses; stopped because of intolerance-fatigue patient preference/lack of progression.  # JAN 2024-bronchoscopy biopsy negative for malignancy; however PET scan concerning for recurrence/progression.    May 05, 2022-s/p bronchoscopy- with Dr.Aleskerov-significant purulent/necrotic debris noted. Dec 23rd, 2023- PET scan: The cavitary process in the right upper lobe is similar in size to previous CTS, although demonstrates prominent peripheral hypermetabolic activity. 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 Hypermetabolic activity within the adjacent upper right lateral chest wall with right 3rd through 5th rib deformities, suspicious for direct tumor spread. No evidence of distant osseous metastatic disease; Hypermetabolic left hilar lymph node worrisome for metastatic disease. No other hypermetabolic lymph nodes or distant metastases identified.  Bronchoscopy/EBUS [JAN 26th, 2024- Dr.A]- BAL/ 4R-lymph node FNA negative for malignancy.  Patient declined further invasive procedures.  # FEB 15t, 2024- RE-Start Keytruda.   # DEC 2022- s/p ID- Dr.[email or web address]- ? Lung abscess-no antibiotics-  JAN 2023- CT scan incidental- cecal mass- colonoscopy[Dr.Russo; [facility]- high grade dysplasia]  S/p evaluation with Dr. Sherryle Lis surgery for now]   # S/p evaluation with Dr. Sheppard Penton.   Primary cancer of right upper lobe of lung (HCC)  06/03/2021 Initial Diagnosis   Primary cancer of right upper lobe of lung (HCC)   06/03/2021 Cancer Staging   Staging form: Lung, AJCC 8th Edition - Clinical: Stage IIIB (cT3, cN2, cM0) - Signed by  Louretta Shorten  R, MD on 06/03/2021   06/15/2021 - 08/03/2021 Chemotherapy   Patient is on Treatment Plan : LUNG Carboplatin / Paclitaxel + XRT q7d     10/02/2021 - 10/19/2021 Chemotherapy   Patient is on Treatment Plan : LUNG Durvalumab q14d     02/03/2022 -  Chemotherapy   Patient is on Treatment Plan : LUNG NSCLC Pembrolizumab (200) q21d     02/04/2022 - 03/18/2022 Chemotherapy   Patient is on Treatment Plan : LUNG NSCLC Pembrolizumab (200) q21d      HISTORY OF PRESENTING ILLNESS: Patient is ambulating independently.  Accompanied by wife.  Alonzo Ruszala 67 y.o.  male history of smoking -"lung cancer" [Limited necrotic tissue]-locally advanced unresectable- currently on surveillance; and Cecal mass-high grade dysplasia intact-  is here for follow-up.  [name] was discontinued last visit because of poor tolerance/patient preference  Patient has not had any further hospitalizations.  Patient is having severe pain on his right upper side of his chest and shoulder, he rates his pain at a 8.  Patient currently prescribed- oxycodone and fentanyl patch.  He continues to get IV fluids-twice a week-feels improved after IV infusions. Denies abdominal pain nausea vomiting or diarrhea.  No blood in stools black or stools.  Review of Systems  Constitutional:  Positive for malaise/fatigue and weight loss. Negative for chills, diaphoresis and fever.  HENT:  Negative for nosebleeds and sore throat.   Eyes:  Negative for double vision.  Respiratory:  Positive for cough, sputum production and shortness of breath. Negative for hemoptysis and wheezing.   Cardiovascular:  Positive for chest pain. Negative for palpitations, orthopnea and leg swelling.  Gastrointestinal:  Positive for nausea. Negative for abdominal pain, blood in stool, constipation, diarrhea, heartburn, melena and vomiting.  Genitourinary:  Negative for dysuria, frequency and urgency.  Musculoskeletal:  Negative for back pain and joint  pain.  Skin: Negative.  Negative for itching and rash.  Neurological:  Positive for tingling. Negative for dizziness, focal weakness, weakness and headaches.  Endo/Heme/Allergies:  Does not bruise/bleed easily.  Psychiatric/Behavioral:  Negative for depression. The patient is not nervous/anxious and does not have insomnia.      MEDICAL HISTORY:  Past Medical History:  Diagnosis Date   Acute on chronic respiratory failure (HCC)    Anemia    Cancer (HCC)    Colostomy present (HCC)    COPD (chronic obstructive pulmonary disease) (HCC)    Degenerative arthritis of knee    Depression    Diabetes mellitus without complication (HCC)    Diverticulitis    Dyspnea    Glaucoma    since 2004   Hernia 1979   History of kidney stones    History of methicillin resistant staphylococcus aureus (MRSA)    Hypertension    Hypokalemia    Marijuana use    Mass of cecum    Nephrolithiasis    Neuromuscular disorder (HCC)    Personal history of tobacco use, presenting hazards to health    Pneumonia    Port-A-Cath in place    Pre-diabetes    Primary cancer of right upper lobe of lung (HCC)    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)    Screening for obesity    Severe sepsis (HCC)    Special screening for malignant neoplasms, colon    Tobacco use    Wears dentures    full upper and lower    SURGICAL HISTORY: Past Surgical History:  Procedure Laterality Date   BACK SURGERY  1994,  2012   lumbar bulging disc   BRONCHOSCOPY  05/05/2022   CATARACT EXTRACTION W/PHACO Left 01/20/2021   Procedure: CATARACT EXTRACTION PHACO AND INTRAOCULAR LENS PLACEMENT (IOC) LEFT;  Surgeon: Galen Manila, MD;  Location: [facility];  Service: Ophthalmology;  Laterality: Left;  10.13 0:52.1   COLONOSCOPY  1191,4782   [facility]/Dr. Carmell Austria sessile polyp,65mm, found in rectum,multiple diverticula were found in the sigmoid, in descending and in transverse colon   COLONOSCOPY WITH PROPOFOL N/A 10/22/2021    Procedure: COLONOSCOPY WITH PROPOFOL;  Surgeon: Jaynie Collins, DO;  Location: [facility];  Service: Gastroenterology;  Laterality: N/A;  DM   COLOSTOMY  04/20/2013   EYE SURGERY     HERNIA REPAIR  1979   inguinal   IR IMAGING GUIDED PORT INSERTION  06/10/2021   JOINT REPLACEMENT Right    knee   KNEE ARTHROPLASTY Right 05/08/2018   Procedure: COMPUTER ASSISTED TOTAL KNEE ARTHROPLASTY;  Surgeon: Donato Heinz, MD;  Location: [facility];  Service: Orthopedics;  Laterality: Right;   KNEE ARTHROPLASTY Left 11/16/2019   Procedure: COMPUTER ASSISTED TOTAL KNEE ARTHROPLASTY;  Surgeon: Donato Heinz, MD;  Location: [facility];  Service: Orthopedics;  Laterality: Left;   LAPAROTOMY  04/20/2013   colon resection with colosotmy   LITHOTRIPSY  2013   LUNG BIOPSY  2023   RIGID BRONCHOSCOPY N/A 10/29/2022   Procedure: RIGID BRONCHOSCOPY;  Surgeon: [name], MD;  Location: [facility];  Service: Thoracic;  Laterality: N/A;   TONSILLECTOMY AND ADENOIDECTOMY  1962   VIDEO BRONCHOSCOPY WITH ENDOBRONCHIAL NAVIGATION N/A 05/05/2022   Procedure: VIDEO BRONCHOSCOPY WITH ENDOBRONCHIAL NAVIGATION;  Surgeon: [name], MD;  Location: [facility];  Service: Thoracic;  Laterality: N/A;   VIDEO BRONCHOSCOPY WITH ENDOBRONCHIAL ULTRASOUND N/A 05/05/2022   Procedure: VIDEO BRONCHOSCOPY WITH ENDOBRONCHIAL ULTRASOUND;  Surgeon: [name], MD;  Location: [facility];  Service: Thoracic;  Laterality: N/A;   VIDEO BRONCHOSCOPY WITH ENDOBRONCHIAL ULTRASOUND N/A 10/29/2022   Procedure: VIDEO BRONCHOSCOPY WITH ENDOBRONCHIAL ULTRASOUND;  Surgeon: [name], MD;  Location: [facility];  Service: Thoracic;  Laterality: N/A;    SOCIAL HISTORY: Social History   Socioeconomic History   Marital status: Married    Spouse name: Pegge   Number of children: Not on file   Years of education: Not on file   Highest education level: Not on file  Occupational History   Not on file  Tobacco Use   Smoking status:  Every Day    Packs/day: 1.00    Years: 30.00    Additional pack years: 0.00    Total pack years: 30.00    Types: Cigarettes   Smokeless tobacco: Never   Tobacco comments:    since age 14 or 69  Vaping Use   Vaping Use: Never used  Substance and Sexual Activity   Alcohol use: Yes    Alcohol/week: 2.0 standard drinks of alcohol    Types: 2 Standard drinks or equivalent per week    Comment: rare   Drug use: Yes    Types: Marijuana    Comment: fentanyl patch prescribed & oxycodone   Sexual activity: Not on file  Other Topics Concern   Not on file  Social History Narrative   [place]; smoking; not much alcohol. Worked for [employer], 2019. Marland [employer]    Social Determinants of Health   Financial Resource Strain: Low Risk  (08/10/2022)   Overall Financial Resource Strain (CARDIA)    Difficulty of Paying Living Expenses: Not very hard  Food Insecurity: No Food Insecurity (01/20/2023)   Hunger Vital Sign    Worried About Running Out of Food in the Last Year: Never true    Ran Out of Food in the Last Year: Never true  Transportation Needs: No Transportation Needs (01/20/2023)   PRAPARE - Administrator, Civil Service (Medical): No    Lack of Transportation (Non-Medical): No  Physical Activity: Not on file  Stress: No Stress Concern Present (08/10/2022)   [employer] of Occupational Health - Occupational Stress Questionnaire    Feeling of Stress : Not at all  Social Connections: Unknown (08/10/2022)   Social Connection and Isolation Panel [NHANES]    Frequency of Communication with Friends and Family: Three times a week    Frequency of Social Gatherings with Friends and Family: Twice a week    Attends Religious Services: Patient declined    Database administrator or Organizations: No    Attends Banker Meetings: Never    Marital Status: Married  Catering manager Violence: Not At Risk (01/20/2023)   Humiliation, Afraid, Rape, and Kick  questionnaire    Fear of Current or Ex-Partner: No    Emotionally Abused: No    Physically Abused: No    Sexually Abused: No    FAMILY HISTORY: Family History  Problem Relation Age of Onset   Diabetes Mother    Heart disease Mother    Heart attack Father    Prostate cancer Father     ALLERGIES:  is allergic to paclitaxel, ambien [zolpidem], nsaids, and aleve [naproxen sodium].  MEDICATIONS:  Current Outpatient Medications  Medication Sig Dispense Refill   Accu-Chek Softclix Lancets lancets USE AS DIRECTED FOUR TIMES DAILY 100 each 12   acetaminophen (TYLENOL) 500 MG tablet Take 2 tablets (1,000 mg total) by mouth daily as needed. Home med. 30 tablet 0   albuterol (PROVENTIL) (2.5 MG/3ML) 0.083% nebulizer solution Take 2.5 mg by nebulization every 4 (four) hours as needed for wheezing or shortness of breath.     albuterol (VENTOLIN HFA) 108 (90 Base) MCG/ACT inhaler SMARTSIG:2 inhalation Via Inhaler Every 6 Hours PRN 1 each 2   blood glucose meter kit and supplies KIT Check your blood glucose levels twice a day; once in the morning before breakfast; and once in the evening after dinner. 1 each 0   BREZTRI AEROSPHERE 160-9-4.8 MCG/ACT AERO Inhale 2 puffs into the lungs 2 (two) times daily.     dextromethorphan-guaiFENesin (MUCINEX DM) 30-600 MG 12hr tablet Take 1 tablet by mouth 2 (two) times daily as needed for cough.     dorzolamide-timolol (COSOPT) 22.3-6.8 MG/ML ophthalmic solution Place 1 drop into both eyes 2 (two) times daily.     fentaNYL (DURAGESIC) 25 MCG/HR Place 1 patch onto the skin every 3 (three) days. 10 patch 0   glipiZIDE (GLUCOTROL) 5 MG tablet TAKE 1 TABLET(5 MG) BY MOUTH DAILY BEFORE BREAKFAST 90 tablet 2   glucose blood test strip Check your blood glucose levels twice a day; once in the morning before breakfast; and once in the evening after dinner. 100 each 12   ipratropium-albuterol (DUONEB) 0.5-2.5 (3) MG/3ML SOLN Take 3 mLs by nebulization every 6 (six) hours  as needed. 360 mL 1   latanoprost (XALATAN) 0.005 % ophthalmic solution Place 1 drop into both eyes at bedtime.     lidocaine-prilocaine (EMLA) cream Apply 1 Application topically as needed. 30 g 3   metFORMIN (GLUCOPHAGE) 500 MG tablet Take 1 tablet (500 mg  total) by mouth 2 (two) times daily with a meal. 60 tablet 2   montelukast (SINGULAIR) 10 MG tablet TAKE 1 TABLET(10 MG) BY MOUTH AT BEDTIME 90 tablet 0   Multiple Vitamin (MULTIVITAMIN WITH MINERALS) TABS tablet Take 1 tablet by mouth daily.     naloxone (NARCAN) nasal spray 4 mg/0.1 mL Place 1 spray into the nose once.     pregabalin (LYRICA) 50 MG capsule Take 1 capsule (50 mg total) by mouth 2 (two) times daily. 60 capsule 2   prochlorperazine (COMPAZINE) 10 MG tablet TAKE 1 TABLET(10 MG) BY MOUTH EVERY 6 HOURS AS NEEDED FOR NAUSEA OR VOMITING 30 tablet 1   propranolol ER (INDERAL LA) 60 MG 24 hr capsule TAKE 1 CAPSULE(60 MG) BY MOUTH DAILY 30 capsule 3   Respiratory Therapy Supplies (NEBULIZER) DEVI Use as directed 1 each 0   tamsulosin (FLOMAX) 0.4 MG CAPS capsule Take 0.4 mg by mouth daily after supper.     triamcinolone ointment (KENALOG) 0.5 % Apply 1 Application topically 2 (two) times daily. 30 g 1   venlafaxine XR (EFFEXOR-XR) 75 MG 24 hr capsule Take 225 mg by mouth daily with breakfast.     vitamin B-12 (CYANOCOBALAMIN) 500 MCG tablet Take 1 tablet by mouth daily.     eszopiclone (LUNESTA) 1 MG TABS tablet TAKE 1 TABLET(1 MG) BY MOUTH AT BEDTIME AS NEEDED FOR SLEEP 30 tablet 0   olmesartan (BENICAR) 40 MG tablet Take 1 tablet (40 mg total) by mouth every morning. (Patient not taking: Reported on 02/22/2023) 30 tablet 3   oxyCODONE (OXY IR/ROXICODONE) 5 MG immediate release tablet Take 1 tablet (5 mg total) by mouth every 8 (eight) hours as needed for severe pain. 90 tablet 0   No current facility-administered medications for this visit.   Facility-Administered Medications Ordered in Other Visits  Medication Dose Route Frequency  Provider Last Rate Last Admin   heparin lock flush 100 UNIT/ML injection            heparin lock flush 100 UNIT/ML injection            heparin lock flush 100 UNIT/ML injection               .  PHYSICAL EXAMINATION: ECOG PERFORMANCE STATUS: 1 - Symptomatic but completely ambulatory  Vitals:   02/22/23 0957  BP: 123/80  Pulse: 93  Temp: (!) 96.7 F (35.9 C)  SpO2: 100%           Filed Weights   02/22/23 0957  Weight: 162 lb 4.8 oz (73.6 kg)    Mild skin rash in the back.   Physical Exam Vitals and nursing note reviewed.  HENT:     Head: Normocephalic and atraumatic.     Mouth/Throat:     Pharynx: Oropharynx is clear.  Eyes:     Extraocular Movements: Extraocular movements intact.     Pupils: Pupils are equal, round, and reactive to light.  Cardiovascular:     Rate and Rhythm: Normal rate and regular rhythm.  Pulmonary:     Comments: Decreased breath sounds bilaterally.  Abdominal:     Palpations: Abdomen is soft.  Musculoskeletal:        General: Normal range of motion.     Cervical back: Normal range of motion.  Skin:    General: Skin is warm.  Neurological:     General: No focal deficit present.     Mental Status: He is alert and oriented to person, place, and  time.  Psychiatric:        Behavior: Behavior normal.        Judgment: Judgment normal.      LABORATORY DATA:  I have reviewed the data as listed Lab Results  Component Value Date   WBC 6.0 02/22/2023   HGB 11.6 (L) 02/22/2023   HCT 36.2 (L) 02/22/2023   MCV 87.9 02/22/2023   PLT 196 02/22/2023   Recent Labs    01/20/23 1044 01/20/23 1357 01/25/23 0939 02/08/23 0950 02/22/23 0944  NA 135   < > 137 138 138  K 3.9   < > 3.1* 3.3* 3.3*  CL 100   < > 100 100 99  CO2 27   < > 27 29 26   GLUCOSE 113*   < > 142* 183* 186*  BUN 40*   < > 10 8 10   CREATININE 1.73*   < > 1.03 1.07 1.08  CALCIUM 8.9   < > 8.4* 8.9 8.8*  GFRNONAA 43*   < > >60 >60 >60  PROT 7.1  --  6.9  --  7.0   ALBUMIN 3.6  --  3.4*  --  3.5  AST 14*  --  15  --  23  ALT 7  --  9  --  6  ALKPHOS 90  --  95  --  101  BILITOT 0.5  --  0.3  --  0.3   < > = values in this interval not displayed.    RADIOGRAPHIC STUDIES: I have personally reviewed the radiological images as listed and agreed with the findings in the report. No results found.  ASSESSMENT & PLAN:   Primary cancer of right upper lobe of lung (HCC) # UNRESECTABLE/LOCALLY ADVANCED- Right upper lobe lung mass -concerning for malignancy [biopsy inconclusive/atypical cells]-  April 2023- PD-L1 positive [cirucologene].  PET-December 2023 concern for worsening mediastinal lymphadenopathy.  Bronchoscopy/EBUS [JAN 26th, 2024- Dr.A]- BAL/ 4R-lymph node FNA negative for malignancy.  Patient declined further invasive procedures.  Currently status post Keytruda- 12th, APRIL 2024-stable right upper necrotic mass noted with extension through the chest wall laterally with erosion of adjacent ribs and pathologic fracture.; slight decrease in size of the mediastinal lymph node. APRIL 2024-patient discontinued Keytruda after 3 cycles-given extreme poor tolerance worsening fatigue  # continue surveillance off therapy- on a clinical basis. Will plan imaging in 3-4 months [July-aug] or so.   # Patient however wants to continue symptomatic management with IV fluids; supportive care as needed.  Intermittent hypotension/falls-needing recent hospitalization.    # Anemia- mild to moderate- feb 2024- iron sat- 17%; ferritin- 61- recommend venofer weekly x3- stable; monitor for now.     #Right chest wall pain-/pleuritic-rib destruction malignancy versus radiation- ADDED back [march 2024; [name]]-fenatny patch 25 mcg- Continue Oxycodone 5 mg every 8 to 12 hours hours as needed overall-  stable; if worse will increase the dose to 50 mcg fentanyl.    # COPD/fatigue- [coughing/phlegm/ no fevers]- on albuterol/advair [smoking]- TSH- WNL;Continue  Xopenex/ipratropium  nebs-; Mucinex- DM BID. stable.   # Hypokalemia-3.3 - continue potassium 20 mEq liquid.-Hypocalemia: 8.4/albumin- 3.2. MARCH 2024-vit D 105- HOLD off -Vit D 50,K; will check in 2-3 months- stable.   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 Lemar Livings. CT APRIL- 2024- mass lesion identified along the cecum obstructing the orifice of the appendix.  Clinically stable continue surveillance given patient's multiple comorbidities/reluctance with any surgical options.  stable.   # Poorly controlled diabetes-blood sugars 113- 280;  Continue glipizide; and  Metformin. stable.   # IV mediport: functioning/Stable.  # Insomnia-continue Lunesta refilled- stable.   *appt thru mychart   # DISPOSITION: # IVFs over 1 hour. Also-  KCL 20 IV.   # in 1 week- IVFs-1 lit /1 hourTuesdays/Thursday;     # in 2 weeks- lab-  IVFs-1 lit /1 hourTuesdays/Thursday   # # 3 weeks-  IVFs-1 lit /1 hourTuesdays/Thursday  # follow up in 4 week Tuesday- MD; labs- cbc/cmp; TSH;  IVFs-1 lit /1 hour;KCL 20 IV; - Dr.B   All questions were answered. The patient knows to call the clinic with any problems, questions or concerns.    [name], MD 02/22/2023 10:45 AM

## 2023-02-22 NOTE — Assessment & Plan Note (Addendum)
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  PET-December 2023 concern for worsening mediastinal lymphadenopathy.  Bronchoscopy/EBUS [JAN 26th, 2024- Dr.A]- BAL/ 4R-lymph node FNA negative for malignancy.  Patient declined further invasive procedures.  Currently status post Keytruda- 12th, APRIL 2024-stable right upper necrotic mass noted with extension through the chest wall laterally with erosion of adjacent ribs and pathologic fracture.; slight decrease in size of the mediastinal lymph node. APRIL 2024-patient discontinued Keytruda after 3 cycles-given extreme poor tolerance worsening fatigue  # continue surveillance off therapy- on a clinical basis. Will plan imaging in 3-4 months [July-aug] or so.   # Patient however wants to continue symptomatic management with IV fluids; supportive care as needed.  Intermittent hypotension/falls-needing recent hospitalization.    # Anemia- mild to moderate- feb 2024- iron sat- 17%; ferritin- 61- recommend venofer weekly x3- stable; monitor for now.     #Right chest wall pain-/pleuritic-rib destruction malignancy versus radiation- ADDED back [march 2024; [name]]-fenatny patch 25 mcg- Continue Oxycodone 5 mg every 8 to 12 hours hours as needed overall-  stable; if worse will increase the dose to 50 mcg fentanyl.    # COPD/fatigue- [coughing/phlegm/ no fevers]- on albuterol/advair [smoking]- TSH- WNL;Continue  Xopenex/ipratropium nebs-; Mucinex- DM BID. stable.   # Hypokalemia-3.3 - continue potassium 20 mEq liquid.-Hypocalemia: 8.4/albumin- 3.2. MARCH 2024-vit D 105- HOLD off -Vit D 50,K; will check in 2-3 months- stable.   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 Lemar Livings. CT APRIL- 2024- mass lesion identified along the cecum obstructing the  orifice of the appendix.  Clinically stable continue surveillance given patient's multiple comorbidities/reluctance with any surgical options. stable.   # Poorly controlled diabetes-blood sugars 113- 280;  Continue glipizide; and  Metformin. stable.   # IV mediport: functioning/Stable.  # Insomnia-continue Lunesta refilled- stable.   *appt thru mychart   # DISPOSITION: # IVFs over 1 hour. Also-  KCL 20 IV.   # in 1 week- IVFs-1 lit /1 hourTuesdays/Thursday;     # in 2 weeks- lab-  IVFs-1 lit /1 hourTuesdays/Thursday   # # 3 weeks-  IVFs-1 lit /1 hourTuesdays/Thursday  # follow up in 4 week Tuesday- MD; labs- cbc/cmp; TSH;  IVFs-1 lit /1 hour;KCL 20 IV; - Dr.B

## 2023-02-22 NOTE — Progress Notes (Signed)
Patient is having severe pain on his right upper side of his chest and shoulder, he rates his pain at a 8. Pt. Isn't g

## 2023-02-24 ENCOUNTER — Inpatient Hospital Stay: Payer: Medicare PPO

## 2023-02-24 VITALS — BP 127/72 | HR 74 | Temp 96.7°F | Resp 20 | Wt 162.1 lb

## 2023-02-24 DIAGNOSIS — C3411 Malignant neoplasm of upper lobe, right bronchus or lung: Secondary | ICD-10-CM

## 2023-02-24 DIAGNOSIS — Z5112 Encounter for antineoplastic immunotherapy: Secondary | ICD-10-CM | POA: Diagnosis not present

## 2023-02-24 MED ORDER — HEPARIN SOD (PORK) LOCK FLUSH 100 UNIT/ML IV SOLN
500.0000 [IU] | Freq: Once | INTRAVENOUS | Status: AC | PRN
  Administered 2023-02-24: 500 [IU]
  Filled 2023-02-24: qty 5

## 2023-02-24 MED ORDER — SODIUM CHLORIDE 0.9 % IV SOLN
Freq: Once | INTRAVENOUS | Status: AC
  Filled 2023-02-24: qty 250

## 2023-02-24 MED ORDER — SODIUM CHLORIDE 0.9% FLUSH
10.0000 mL | Freq: Once | INTRAVENOUS | Status: AC | PRN
  Administered 2023-02-24: 10 mL
  Filled 2023-02-24: qty 10

## 2023-02-25 MED FILL — Dexamethasone Sodium Phosphate Inj 100 MG/10ML: INTRAMUSCULAR | Qty: 1 | Status: AC

## 2023-03-01 ENCOUNTER — Inpatient Hospital Stay: Payer: Medicare PPO

## 2023-03-01 VITALS — BP 132/77 | HR 76 | Temp 96.6°F | Resp 20 | Wt 162.0 lb

## 2023-03-01 DIAGNOSIS — Z5112 Encounter for antineoplastic immunotherapy: Secondary | ICD-10-CM | POA: Diagnosis not present

## 2023-03-01 DIAGNOSIS — C3411 Malignant neoplasm of upper lobe, right bronchus or lung: Secondary | ICD-10-CM

## 2023-03-01 MED ORDER — HEPARIN SOD (PORK) LOCK FLUSH 100 UNIT/ML IV SOLN
500.0000 [IU] | Freq: Once | INTRAVENOUS | Status: AC | PRN
  Administered 2023-03-01: 500 [IU]
  Filled 2023-03-01: qty 5

## 2023-03-01 MED ORDER — SODIUM CHLORIDE 0.9% FLUSH
10.0000 mL | Freq: Once | INTRAVENOUS | Status: AC | PRN
  Administered 2023-03-01: 10 mL
  Filled 2023-03-01: qty 10

## 2023-03-01 MED ORDER — SODIUM CHLORIDE 0.9 % IV SOLN
Freq: Once | INTRAVENOUS | Status: AC
  Filled 2023-03-01: qty 250

## 2023-03-03 ENCOUNTER — Inpatient Hospital Stay: Payer: Medicare PPO

## 2023-03-03 VITALS — BP 154/72 | HR 102 | Temp 96.6°F | Resp 20

## 2023-03-03 DIAGNOSIS — Z5112 Encounter for antineoplastic immunotherapy: Secondary | ICD-10-CM | POA: Diagnosis not present

## 2023-03-03 DIAGNOSIS — C3411 Malignant neoplasm of upper lobe, right bronchus or lung: Secondary | ICD-10-CM

## 2023-03-03 MED ORDER — SODIUM CHLORIDE 0.9% FLUSH
10.0000 mL | Freq: Once | INTRAVENOUS | Status: AC | PRN
  Administered 2023-03-03: 10 mL
  Filled 2023-03-03: qty 10

## 2023-03-03 MED ORDER — SODIUM CHLORIDE 0.9 % IV SOLN
Freq: Once | INTRAVENOUS | Status: AC
  Filled 2023-03-03: qty 250

## 2023-03-03 MED ORDER — HEPARIN SOD (PORK) LOCK FLUSH 100 UNIT/ML IV SOLN
500.0000 [IU] | Freq: Once | INTRAVENOUS | Status: AC | PRN
  Administered 2023-03-03: 500 [IU]
  Filled 2023-03-03: qty 5

## 2023-03-07 ENCOUNTER — Other Ambulatory Visit: Payer: Self-pay | Admitting: *Deleted

## 2023-03-07 DIAGNOSIS — C3411 Malignant neoplasm of upper lobe, right bronchus or lung: Secondary | ICD-10-CM

## 2023-03-08 ENCOUNTER — Inpatient Hospital Stay: Payer: Medicare PPO

## 2023-03-08 ENCOUNTER — Inpatient Hospital Stay: Payer: Medicare PPO | Attending: Internal Medicine

## 2023-03-08 VITALS — BP 100/63 | HR 89 | Temp 97.5°F | Resp 20

## 2023-03-08 DIAGNOSIS — C3411 Malignant neoplasm of upper lobe, right bronchus or lung: Secondary | ICD-10-CM | POA: Insufficient documentation

## 2023-03-08 DIAGNOSIS — E119 Type 2 diabetes mellitus without complications: Secondary | ICD-10-CM | POA: Insufficient documentation

## 2023-03-08 DIAGNOSIS — C771 Secondary and unspecified malignant neoplasm of intrathoracic lymph nodes: Secondary | ICD-10-CM | POA: Diagnosis not present

## 2023-03-08 DIAGNOSIS — I10 Essential (primary) hypertension: Secondary | ICD-10-CM | POA: Insufficient documentation

## 2023-03-08 DIAGNOSIS — F17210 Nicotine dependence, cigarettes, uncomplicated: Secondary | ICD-10-CM | POA: Diagnosis not present

## 2023-03-08 DIAGNOSIS — M25511 Pain in right shoulder: Secondary | ICD-10-CM | POA: Insufficient documentation

## 2023-03-08 DIAGNOSIS — Z87442 Personal history of urinary calculi: Secondary | ICD-10-CM | POA: Insufficient documentation

## 2023-03-08 DIAGNOSIS — Z7984 Long term (current) use of oral hypoglycemic drugs: Secondary | ICD-10-CM | POA: Diagnosis not present

## 2023-03-08 DIAGNOSIS — Z79899 Other long term (current) drug therapy: Secondary | ICD-10-CM | POA: Diagnosis not present

## 2023-03-08 DIAGNOSIS — G4700 Insomnia, unspecified: Secondary | ICD-10-CM | POA: Diagnosis not present

## 2023-03-08 DIAGNOSIS — E876 Hypokalemia: Secondary | ICD-10-CM | POA: Diagnosis not present

## 2023-03-08 DIAGNOSIS — Z8042 Family history of malignant neoplasm of prostate: Secondary | ICD-10-CM | POA: Insufficient documentation

## 2023-03-08 DIAGNOSIS — R079 Chest pain, unspecified: Secondary | ICD-10-CM | POA: Insufficient documentation

## 2023-03-08 DIAGNOSIS — E1165 Type 2 diabetes mellitus with hyperglycemia: Secondary | ICD-10-CM | POA: Diagnosis not present

## 2023-03-08 DIAGNOSIS — J449 Chronic obstructive pulmonary disease, unspecified: Secondary | ICD-10-CM | POA: Diagnosis not present

## 2023-03-08 DIAGNOSIS — J9620 Acute and chronic respiratory failure, unspecified whether with hypoxia or hypercapnia: Secondary | ICD-10-CM | POA: Diagnosis not present

## 2023-03-08 LAB — BASIC METABOLIC PANEL - CANCER CENTER ONLY
Anion gap: 10 (ref 5–15)
BUN: 9 mg/dL (ref 8–23)
CO2: 26 mmol/L (ref 22–32)
Calcium: 8.6 mg/dL — ABNORMAL LOW (ref 8.9–10.3)
Chloride: 102 mmol/L (ref 98–111)
Creatinine: 0.92 mg/dL (ref 0.61–1.24)
GFR, Estimated: >60 mL/min (ref >60)
Glucose, Bld: 176 mg/dL — ABNORMAL HIGH (ref 70–99)
Potassium: 3.5 mmol/L (ref 3.5–5.1)
Sodium: 138 mmol/L (ref 135–145)

## 2023-03-08 LAB — CBC WITH DIFFERENTIAL (CANCER CENTER ONLY)
Abs Immature Granulocytes: 0.04 10*3/uL (ref 0.00–0.07)
Basophils Absolute: 0.1 10*3/uL (ref 0.0–0.1)
Basophils Relative: 1 %
Eosinophils Absolute: 0.3 10*3/uL (ref 0.0–0.5)
Eosinophils Relative: 5 %
HCT: 35.3 % — ABNORMAL LOW (ref 39.0–52.0)
Hemoglobin: 11.5 g/dL — ABNORMAL LOW (ref 13.0–17.0)
Immature Granulocytes: 1 %
Lymphocytes Relative: 15 %
Lymphs Abs: 1 10*3/uL (ref 0.7–4.0)
MCH: 28.9 pg (ref 26.0–34.0)
MCHC: 32.6 g/dL (ref 30.0–36.0)
MCV: 88.7 fL (ref 80.0–100.0)
Monocytes Absolute: 0.6 10*3/uL (ref 0.1–1.0)
Monocytes Relative: 9 %
Neutro Abs: 4.7 10*3/uL (ref 1.7–7.7)
Neutrophils Relative %: 69 %
Platelet Count: 161 10*3/uL (ref 150–400)
RBC: 3.98 MIL/uL — ABNORMAL LOW (ref 4.22–5.81)
RDW: 15 % (ref 11.5–15.5)
WBC Count: 6.7 10*3/uL (ref 4.0–10.5)
nRBC: 0 % (ref 0.0–0.2)

## 2023-03-08 MED ORDER — SODIUM CHLORIDE 0.9% FLUSH
10.0000 mL | Freq: Once | INTRAVENOUS | Status: AC
  Administered 2023-03-08: 10 mL via INTRAVENOUS
  Filled 2023-03-08: qty 10

## 2023-03-08 MED ORDER — HEPARIN SOD (PORK) LOCK FLUSH 100 UNIT/ML IV SOLN
500.0000 [IU] | Freq: Once | INTRAVENOUS | Status: AC
  Administered 2023-03-08: 500 [IU] via INTRAVENOUS
  Filled 2023-03-08: qty 5

## 2023-03-08 MED ORDER — SODIUM CHLORIDE 0.9 % IV SOLN
Freq: Once | INTRAVENOUS | Status: AC
  Filled 2023-03-08: qty 250

## 2023-03-10 ENCOUNTER — Inpatient Hospital Stay: Payer: Medicare PPO

## 2023-03-10 VITALS — BP 116/73 | HR 85 | Temp 97.4°F | Resp 20 | Wt 163.0 lb

## 2023-03-10 DIAGNOSIS — C3411 Malignant neoplasm of upper lobe, right bronchus or lung: Secondary | ICD-10-CM

## 2023-03-10 MED ORDER — HEPARIN SOD (PORK) LOCK FLUSH 100 UNIT/ML IV SOLN
500.0000 [IU] | Freq: Once | INTRAVENOUS | Status: AC | PRN
  Administered 2023-03-10: 500 [IU]
  Filled 2023-03-10: qty 5

## 2023-03-10 MED ORDER — SODIUM CHLORIDE 0.9 % IV SOLN
Freq: Once | INTRAVENOUS | Status: AC
  Filled 2023-03-10: qty 250

## 2023-03-10 MED ORDER — SODIUM CHLORIDE 0.9% FLUSH
10.0000 mL | Freq: Once | INTRAVENOUS | Status: AC | PRN
  Administered 2023-03-10: 10 mL
  Filled 2023-03-10: qty 10

## 2023-03-14 MED FILL — Dexamethasone Sodium Phosphate Inj 100 MG/10ML: INTRAMUSCULAR | Qty: 1 | Status: AC

## 2023-03-15 ENCOUNTER — Inpatient Hospital Stay: Payer: Medicare PPO

## 2023-03-15 VITALS — BP 125/72 | HR 79 | Temp 96.4°F | Resp 20

## 2023-03-15 DIAGNOSIS — C3411 Malignant neoplasm of upper lobe, right bronchus or lung: Secondary | ICD-10-CM

## 2023-03-15 MED ORDER — SODIUM CHLORIDE 0.9 % IV SOLN
Freq: Once | INTRAVENOUS | Status: AC
  Filled 2023-03-15: qty 250

## 2023-03-15 MED ORDER — SODIUM CHLORIDE 0.9% FLUSH
10.0000 mL | Freq: Once | INTRAVENOUS | Status: AC | PRN
  Administered 2023-03-15: 10 mL
  Filled 2023-03-15: qty 10

## 2023-03-15 MED ORDER — HEPARIN SOD (PORK) LOCK FLUSH 100 UNIT/ML IV SOLN
500.0000 [IU] | Freq: Once | INTRAVENOUS | Status: AC | PRN
  Administered 2023-03-15: 500 [IU]
  Filled 2023-03-15: qty 5

## 2023-03-16 MED FILL — Dexamethasone Sodium Phosphate Inj 100 MG/10ML: INTRAMUSCULAR | Qty: 1 | Status: AC

## 2023-03-17 ENCOUNTER — Inpatient Hospital Stay: Payer: Medicare PPO

## 2023-03-17 VITALS — BP 122/68 | HR 75 | Temp 96.6°F | Resp 20 | Wt 161.4 lb

## 2023-03-17 DIAGNOSIS — C3411 Malignant neoplasm of upper lobe, right bronchus or lung: Secondary | ICD-10-CM

## 2023-03-17 MED ORDER — SODIUM CHLORIDE 0.9% FLUSH
10.0000 mL | Freq: Once | INTRAVENOUS | Status: AC | PRN
  Administered 2023-03-17: 10 mL
  Filled 2023-03-17: qty 10

## 2023-03-17 MED ORDER — SODIUM CHLORIDE 0.9 % IV SOLN
Freq: Once | INTRAVENOUS | Status: AC
  Filled 2023-03-17: qty 250

## 2023-03-17 MED ORDER — HEPARIN SOD (PORK) LOCK FLUSH 100 UNIT/ML IV SOLN
500.0000 [IU] | Freq: Once | INTRAVENOUS | Status: AC | PRN
  Administered 2023-03-17: 500 [IU]
  Filled 2023-03-17: qty 5

## 2023-03-22 ENCOUNTER — Inpatient Hospital Stay: Payer: Medicare PPO

## 2023-03-22 ENCOUNTER — Encounter: Payer: Self-pay | Admitting: Internal Medicine

## 2023-03-22 ENCOUNTER — Inpatient Hospital Stay (HOSPITAL_BASED_OUTPATIENT_CLINIC_OR_DEPARTMENT_OTHER): Payer: Medicare PPO | Admitting: Internal Medicine

## 2023-03-22 VITALS — BP 135/77 | HR 85 | Temp 97.9°F | Ht 71.0 in | Wt 162.0 lb

## 2023-03-22 DIAGNOSIS — C3411 Malignant neoplasm of upper lobe, right bronchus or lung: Secondary | ICD-10-CM

## 2023-03-22 LAB — CMP (CANCER CENTER ONLY)
ALT: 6 U/L (ref 0–44)
AST: 15 U/L (ref 15–41)
Albumin: 3.4 g/dL — ABNORMAL LOW (ref 3.5–5.0)
Alkaline Phosphatase: 95 U/L (ref 38–126)
Anion gap: 10 (ref 5–15)
BUN: 10 mg/dL (ref 8–23)
CO2: 27 mmol/L (ref 22–32)
Calcium: 8.7 mg/dL — ABNORMAL LOW (ref 8.9–10.3)
Chloride: 102 mmol/L (ref 98–111)
Creatinine: 1 mg/dL (ref 0.61–1.24)
GFR, Estimated: >60 mL/min (ref >60)
Glucose, Bld: 154 mg/dL — ABNORMAL HIGH (ref 70–99)
Potassium: 3.3 mmol/L — ABNORMAL LOW (ref 3.5–5.1)
Sodium: 139 mmol/L (ref 135–145)
Total Bilirubin: 0.4 mg/dL (ref 0.3–1.2)
Total Protein: 6.8 g/dL (ref 6.5–8.1)

## 2023-03-22 LAB — CBC WITH DIFFERENTIAL (CANCER CENTER ONLY)
Abs Immature Granulocytes: 0.02 10*3/uL (ref 0.00–0.07)
Basophils Absolute: 0.1 10*3/uL (ref 0.0–0.1)
Basophils Relative: 1 %
Eosinophils Absolute: 0.3 10*3/uL (ref 0.0–0.5)
Eosinophils Relative: 4 %
HCT: 34 % — ABNORMAL LOW (ref 39.0–52.0)
Hemoglobin: 11.1 g/dL — ABNORMAL LOW (ref 13.0–17.0)
Immature Granulocytes: 0 %
Lymphocytes Relative: 12 %
Lymphs Abs: 0.8 10*3/uL (ref 0.7–4.0)
MCH: 28.6 pg (ref 26.0–34.0)
MCHC: 32.6 g/dL (ref 30.0–36.0)
MCV: 87.6 fL (ref 80.0–100.0)
Monocytes Absolute: 0.5 10*3/uL (ref 0.1–1.0)
Monocytes Relative: 7 %
Neutro Abs: 5.3 10*3/uL (ref 1.7–7.7)
Neutrophils Relative %: 76 %
Platelet Count: 180 10*3/uL (ref 150–400)
RBC: 3.88 MIL/uL — ABNORMAL LOW (ref 4.22–5.81)
RDW: 14.6 % (ref 11.5–15.5)
WBC Count: 6.9 10*3/uL (ref 4.0–10.5)
nRBC: 0 % (ref 0.0–0.2)

## 2023-03-22 LAB — TSH: TSH: 5.026 u[IU]/mL — ABNORMAL HIGH (ref 0.350–4.500)

## 2023-03-22 MED ORDER — ESZOPICLONE 1 MG PO TABS
ORAL_TABLET | ORAL | 0 refills | Status: DC
Start: 2023-03-22 — End: 2023-04-20

## 2023-03-22 MED ORDER — SODIUM CHLORIDE 0.9% FLUSH
10.0000 mL | Freq: Once | INTRAVENOUS | Status: AC | PRN
  Administered 2023-03-22: 10 mL
  Filled 2023-03-22: qty 10

## 2023-03-22 MED ORDER — OXYCODONE HCL 5 MG PO TABS
5.0000 mg | ORAL_TABLET | Freq: Three times a day (TID) | ORAL | 0 refills | Status: DC | PRN
Start: 2023-03-22 — End: 2023-04-20

## 2023-03-22 MED ORDER — HEPARIN SOD (PORK) LOCK FLUSH 100 UNIT/ML IV SOLN
500.0000 [IU] | Freq: Once | INTRAVENOUS | Status: AC | PRN
  Administered 2023-03-22: 500 [IU]
  Filled 2023-03-22: qty 5

## 2023-03-22 MED ORDER — FENTANYL 25 MCG/HR TD PT72
1.0000 | MEDICATED_PATCH | TRANSDERMAL | 0 refills | Status: DC
Start: 2023-03-22 — End: 2023-04-20

## 2023-03-22 MED ORDER — POTASSIUM CHLORIDE 20 MEQ/100ML IV SOLN
20.0000 meq | Freq: Once | INTRAVENOUS | Status: AC
  Administered 2023-03-22: 20 meq via INTRAVENOUS

## 2023-03-22 MED ORDER — SODIUM CHLORIDE 0.9 % IV SOLN
Freq: Once | INTRAVENOUS | Status: AC
  Filled 2023-03-22: qty 250

## 2023-03-22 NOTE — Assessment & Plan Note (Addendum)
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  PET-December 2023 concern for worsening mediastinal lymphadenopathy.  Bronchoscopy/EBUS [JAN 26th, 2024- Dr.A]- BAL/ 4R-lymph node FNA negative for malignancy.  Patient declined further invasive procedures.  Currently status post Keytruda- 12th, APRIL 2024-stable right upper necrotic mass noted with extension through the chest wall laterally with erosion of adjacent ribs and pathologic fracture.; slight decrease in size of the mediastinal lymph node. APRIL 2024-patient discontinued Keytruda after 3 cycles-given extreme poor tolerance worsening fatigue. Stalbe.   # continue surveillance off therapy- on a clinical basis. Will plan imaging in  July, 2024- will order CT scan today.  Patient however wants to continue symptomatic management with IV fluids; supportive care as needed.stable  # Anemia- mild to moderate- feb 2024- iron sat- 17%; ferritin- 61- recommend venofer weekly x3- stable; monitor for now.  stable   #Right chest wall pain-/pleuritic-rib destruction malignancy versus radiation- ADDED back [march 2024; [name]]-fenatny patch 25 mcg- Continue Oxycodone 5 mg every 8 to 12 hours hours as needed overall-  stable; if worse will increase the dose to 50 mcg fentanyl.    # COPD/fatigue- [coughing/phlegm/ no fevers]- on albuterol/advair [smoking]- TSH- WNL;Continue  Xopenex/ipratropium nebs-; Mucinex- DM BID. stable.   # Hypokalemia-3.3 - continue potassium 20 mEq liquid.-Hypocalemia: 8.4/albumin- 3.2. MARCH 2024-vit D 105- HOLD off -Vit D 50,K; will check in 2-3 months- stable.   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 Lemar Livings. CT APRIL- 2024- mass lesion identified along the cecum obstructing the orifice of the appendix.  Clinically  stable continue surveillance given patient's multiple comorbidities/reluctance with any surgical options.stable.   # Poorly controlled diabetes-blood sugars 113- 280;  Continue glipizide; and  Metformin. stable.   # IV mediport: functioning/stable.   # Insomnia-continue Lunesta refilled- stable.   *appt thru mychart   # DISPOSITION: #  6 /20-Thursday IVFs-1 lit /1 hour  # IVFs over 1 hour. Today- KCL 20 IV.   # in 1 week- IVFs-1 lit /1 hourTuesdays/Thursday;     # in 2 weeks- lab-  IVFs-1 lit /1 hourTuesdays/Thursday   # # 3 weeks-  IVFs-1 lit /1 hourTuesdays/Thursday  # follow up in 4 week Tuesday- MD; labs- cbc/cmp; TSH;  IVFs-1 lit /1 hour;KCL 20 IV; CT scan Chest-  #  7/ 18 -Thursday IVFs-1 lit /1 hour - Dr.B

## 2023-03-22 NOTE — Progress Notes (Signed)
[facility] CONSULT NOTE  Patient Care Team: [name], MD as PCP - General (Family Medicine) [name], RN as Oncology Nurse Navigator [name], [name], MD as Consulting Physician (Urology) [name], MD as Consulting Physician (Oncology) [name], MD as Consulting Physician (Pulmonary Disease)  CHIEF COMPLAINTS/PURPOSE OF CONSULTATION: lung cancer    Oncology History Overview Note  AUG 2022- 8.2 x 5.9 cm spiculated mass noted in right upper lobe consistent with malignancy. It extends from the right hilum to the lateral chest wall and results in lytic destruction of the lateral portion of the right fourth rib.  Mediastinal lymph nodes are noted, including 12 mm right hilar lymph node, consistent with metastatic disease. 3 cm right adrenal mass is noted concerning for metastatic disease.  # AUG 2022- PET IMPRESSION: Peripherally hypermetabolic right upper lobe mass, likely due to central necrosis. Mass extends into the right hilum and right lateral chest wall involving the second through fourth right ribs.   Mildly enlarged and hypermetabolic right hilar lymph node.   No evidence of metastatic disease in the abdomen or pelvis.  # AUG, 2022-RIGHT CHEST WALL Bx-necrosis; suspicious for malignancy not definitive [discussed with Dr.Kraynie;MDT] LUNG MASS, RIGHT; BIOPSY:  - SUSPICIOUS FOR MALIGNANCY.  - SEE COMMENT.   Comment:  Approximately six tissue cores demonstrate inflamed fibrous capsule with  hemosiderin-laden macrophages, in a background of abundant necrosis.  One of the cores demonstrates a minute fragment of viable epithelial  cells.  These cells are positive for p40.  They are negative for TTF1  and CK7. The cells of interest disappear on deeper cut levels.  While  the findings are suspicious for squamous cell carcinoma, there is not  enough viable tumor for a definitive diagnosis.  Additional tissue  sampling may be helpful.    # SEP 2022-cycle #2 Taxol hypersensitive reaction.  Switched over to #3 cycle- carbo-Abraxane starting 06/29/2021.  Discontinued IMFINZI therapy-December 2022 given poor tolerance/pneumonia admission to hospital.  #  [name] was in May-June 2023 x3 doses; stopped because of intolerance-fatigue patient preference/lack of progression.  # JAN 2024-bronchoscopy biopsy negative for malignancy; however PET scan concerning for recurrence/progression.    May 05, 2022-s/p bronchoscopy- with Dr.Aleskerov-significant purulent/necrotic debris noted. Dec 23rd, 2023- PET scan: The cavitary process in the right upper lobe is similar in size to previous CTS, although demonstrates prominent peripheral hypermetabolic activity. 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 Hypermetabolic activity within the adjacent upper right lateral chest wall with right 3rd through 5th rib deformities, suspicious for direct tumor spread. No evidence of distant osseous metastatic disease; Hypermetabolic left hilar lymph node worrisome for metastatic disease. No other hypermetabolic lymph nodes or distant metastases identified.  Bronchoscopy/EBUS [JAN 26th, 2024- Dr.A]- BAL/ 4R-lymph node FNA negative for malignancy.  Patient declined further invasive procedures.  # FEB 15t, 2024- RE-Start Keytruda.   # DEC 2022- s/p ID- Dr.ravishankar- ? Lung abscess-no antibiotics-  JAN 2023- CT scan incidental- cecal mass- colonoscopy[Dr.Russo; [facility]- high grade dysplasia]  S/p evaluation with Dr. Sherryle Lis surgery for now]   # S/p evaluation with Dr. Sheppard Penton.   Primary cancer of right upper lobe of lung (HCC)  06/03/2021 Initial Diagnosis   Primary cancer of right upper lobe of lung (HCC)   06/03/2021 Cancer Staging   Staging form: Lung, AJCC 8th Edition - Clinical: Stage IIIB (cT3, cN2, cM0) - Signed by  Louretta Shorten  R, MD on 06/03/2021   06/15/2021 - 08/03/2021 Chemotherapy   Patient is on Treatment Plan : LUNG Carboplatin / Paclitaxel + XRT q7d     10/02/2021 - 10/19/2021 Chemotherapy   Patient is on Treatment Plan : LUNG Durvalumab q14d     02/03/2022 -  Chemotherapy   Patient is on Treatment Plan : LUNG NSCLC Pembrolizumab (200) q21d     02/04/2022 - 03/18/2022 Chemotherapy   Patient is on Treatment Plan : LUNG NSCLC Pembrolizumab (200) q21d      HISTORY OF PRESENTING ILLNESS: Patient is ambulating independently.  Accompanied by wife.  Rodney Vega 67 y.o.  male history of smoking -"lung cancer" [Limited necrotic tissue]-locally advanced unresectable- currently on surveillance [APRIL 2024-Keytruda was discontinued because of poor tolerance/patient preference] ; and Cecal mass-high grade dysplasia intact-  is here for follow-up.   Patient has not had any further hospitalizations.  Patient is having severe pain on his right upper side of his chest and shoulder, he rates his pain at a 8.  Patient currently prescribed- oxycodone and fentanyl patch.  He continues to get IV fluids-twice a week-feels improved after IV infusions. Denies abdominal pain nausea vomiting or diarrhea.  No blood in stools black or stools.  Review of Systems  Constitutional:  Positive for malaise/fatigue and weight loss. Negative for chills, diaphoresis and fever.  HENT:  Negative for nosebleeds and sore throat.   Eyes:  Negative for double vision.  Respiratory:  Positive for cough, sputum production and shortness of breath. Negative for hemoptysis and wheezing.   Cardiovascular:  Positive for chest pain. Negative for palpitations, orthopnea and leg swelling.  Gastrointestinal:  Positive for nausea. Negative for abdominal pain, blood in stool, constipation, diarrhea, heartburn, melena and vomiting.  Genitourinary:  Negative for dysuria, frequency and urgency.  Musculoskeletal:  Negative for back pain and joint  pain.  Skin: Negative.  Negative for itching and rash.  Neurological:  Positive for tingling. Negative for dizziness, focal weakness, weakness and headaches.  Endo/Heme/Allergies:  Does not bruise/bleed easily.  Psychiatric/Behavioral:  Negative for depression. The patient is not nervous/anxious and does not have insomnia.      MEDICAL HISTORY:  Past Medical History:  Diagnosis Date   Acute on chronic respiratory failure (HCC)    Anemia    Cancer (HCC)    Colostomy present (HCC)    COPD (chronic obstructive pulmonary disease) (HCC)    Degenerative arthritis of knee    Depression    Diabetes mellitus without complication (HCC)    Diverticulitis    Dyspnea    Glaucoma    since 2004   Hernia 1979   History of kidney stones    History of methicillin resistant staphylococcus aureus (MRSA)    Hypertension    Hypokalemia    Marijuana use    Mass of cecum    Nephrolithiasis    Neuromuscular disorder (HCC)    Personal history of tobacco use, presenting hazards to health    Pneumonia    Port-A-Cath in place    Pre-diabetes    Primary cancer of right upper lobe of lung (HCC)    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)    Screening for obesity    Severe sepsis (HCC)    Special screening for malignant neoplasms, colon    Tobacco use    Wears dentures    full upper and lower    SURGICAL HISTORY: Past Surgical History:  Procedure Laterality Date   BACK SURGERY  1994,  2012   lumbar bulging disc   BRONCHOSCOPY  05/05/2022   CATARACT EXTRACTION W/PHACO Left 01/20/2021   Procedure: CATARACT EXTRACTION PHACO AND INTRAOCULAR LENS PLACEMENT (IOC) LEFT;  Surgeon: Galen Manila, MD;  Location: [facility];  Service: Ophthalmology;  Laterality: Left;  10.13 0:52.1   COLONOSCOPY  1191,4782   [facility]/Dr. Carmell Austria sessile polyp,65mm, found in rectum,multiple diverticula were found in the sigmoid, in descending and in transverse colon   COLONOSCOPY WITH PROPOFOL N/A 10/22/2021    Procedure: COLONOSCOPY WITH PROPOFOL;  Surgeon: Jaynie Collins, DO;  Location: [facility];  Service: Gastroenterology;  Laterality: N/A;  DM   COLOSTOMY  04/20/2013   EYE SURGERY     HERNIA REPAIR  1979   inguinal   IR IMAGING GUIDED PORT INSERTION  06/10/2021   JOINT REPLACEMENT Right    knee   KNEE ARTHROPLASTY Right 05/08/2018   Procedure: COMPUTER ASSISTED TOTAL KNEE ARTHROPLASTY;  Surgeon: Donato Heinz, MD;  Location: [facility];  Service: Orthopedics;  Laterality: Right;   KNEE ARTHROPLASTY Left 11/16/2019   Procedure: COMPUTER ASSISTED TOTAL KNEE ARTHROPLASTY;  Surgeon: Donato Heinz, MD;  Location: [facility];  Service: Orthopedics;  Laterality: Left;   LAPAROTOMY  04/20/2013   colon resection with colosotmy   LITHOTRIPSY  2013   LUNG BIOPSY  2023   RIGID BRONCHOSCOPY N/A 10/29/2022   Procedure: RIGID BRONCHOSCOPY;  Surgeon: [name], MD;  Location: [facility];  Service: Thoracic;  Laterality: N/A;   TONSILLECTOMY AND ADENOIDECTOMY  1962   VIDEO BRONCHOSCOPY WITH ENDOBRONCHIAL NAVIGATION N/A 05/05/2022   Procedure: VIDEO BRONCHOSCOPY WITH ENDOBRONCHIAL NAVIGATION;  Surgeon: [name], MD;  Location: [facility];  Service: Thoracic;  Laterality: N/A;   VIDEO BRONCHOSCOPY WITH ENDOBRONCHIAL ULTRASOUND N/A 05/05/2022   Procedure: VIDEO BRONCHOSCOPY WITH ENDOBRONCHIAL ULTRASOUND;  Surgeon: [name], MD;  Location: [facility];  Service: Thoracic;  Laterality: N/A;   VIDEO BRONCHOSCOPY WITH ENDOBRONCHIAL ULTRASOUND N/A 10/29/2022   Procedure: VIDEO BRONCHOSCOPY WITH ENDOBRONCHIAL ULTRASOUND;  Surgeon: [name], MD;  Location: [facility];  Service: Thoracic;  Laterality: N/A;    SOCIAL HISTORY: Social History   Socioeconomic History   Marital status: Married    Spouse name: Pegge   Number of children: Not on file   Years of education: Not on file   Highest education level: Not on file  Occupational History   Not on file  Tobacco Use   Smoking status:  Every Day    Packs/day: 1.00    Years: 30.00    Additional pack years: 0.00    Total pack years: 30.00    Types: Cigarettes   Smokeless tobacco: Never   Tobacco comments:    since age 14 or 69  Vaping Use   Vaping Use: Never used  Substance and Sexual Activity   Alcohol use: Yes    Alcohol/week: 2.0 standard drinks of alcohol    Types: 2 Standard drinks or equivalent per week    Comment: rare   Drug use: Yes    Types: Marijuana    Comment: fentanyl patch prescribed & oxycodone   Sexual activity: Not on file  Other Topics Concern   Not on file  Social History Narrative   [place]; smoking; not much alcohol. Worked for [employer], 2019. Marland Kitchen    Social Determinants of Health   Financial Resource Strain: Low Risk  (08/10/2022)   Overall Financial Resource Strain (CARDIA)    Difficulty of Paying Living Expenses: Not very hard  Food Insecurity: No Food Insecurity (01/20/2023)   Hunger Vital Sign    Worried About Running Out of Food in the Last Year: Never true    Ran Out of Food in the Last Year: Never true  Transportation Needs: No Transportation Needs (01/20/2023)   PRAPARE - Administrator, Civil Service (Medical): No    Lack of Transportation (Non-Medical): No  Physical Activity: Not on file  Stress: No Stress Concern Present (08/10/2022)   [employer] of Occupational Health - Occupational Stress Questionnaire    Feeling of Stress : Not at all  Social Connections: Unknown (08/10/2022)   Social Connection and Isolation Panel [NHANES]    Frequency of Communication with Friends and Family: Three times a week    Frequency of Social Gatherings with Friends and Family: Twice a week    Attends Religious Services: Patient declined    Database administrator or Organizations: No    Attends Banker Meetings: Never    Marital Status: Married  Catering manager Violence: Not At Risk (01/20/2023)   Humiliation, Afraid, Rape, and Kick  questionnaire    Fear of Current or Ex-Partner: No    Emotionally Abused: No    Physically Abused: No    Sexually Abused: No    FAMILY HISTORY: Family History  Problem Relation Age of Onset   Diabetes Mother    Heart disease Mother    Heart attack Father    Prostate cancer Father     ALLERGIES:  is allergic to paclitaxel, ambien [zolpidem], nsaids, and aleve [naproxen sodium].  MEDICATIONS:  Current Outpatient Medications  Medication Sig Dispense Refill   Accu-Chek Softclix Lancets lancets USE AS DIRECTED FOUR TIMES DAILY 100 each 12   acetaminophen (TYLENOL) 500 MG tablet Take 2 tablets (1,000 mg total) by mouth daily as needed. Home med. 30 tablet 0   albuterol (PROVENTIL) (2.5 MG/3ML) 0.083% nebulizer solution Take 2.5 mg by nebulization every 4 (four) hours as needed for wheezing or shortness of breath.     albuterol (VENTOLIN HFA) 108 (90 Base) MCG/ACT inhaler SMARTSIG:2 inhalation Via Inhaler Every 6 Hours PRN 1 each 2   blood glucose meter kit and supplies KIT Check your blood glucose levels twice a day; once in the morning before breakfast; and once in the evening after dinner. 1 each 0   BREZTRI AEROSPHERE 160-9-4.8 MCG/ACT AERO Inhale 2 puffs into the lungs 2 (two) times daily.     dextromethorphan-guaiFENesin (MUCINEX DM) 30-600 MG 12hr tablet Take 1 tablet by mouth 2 (two) times daily as needed for cough.     dorzolamide-timolol (COSOPT) 22.3-6.8 MG/ML ophthalmic solution Place 1 drop into both eyes 2 (two) times daily.     glipiZIDE (GLUCOTROL) 5 MG tablet TAKE 1 TABLET(5 MG) BY MOUTH DAILY BEFORE BREAKFAST 90 tablet 2   glucose blood test strip Check your blood glucose levels twice a day; once in the morning before breakfast; and once in the evening after dinner. 100 each 12   ipratropium-albuterol (DUONEB) 0.5-2.5 (3) MG/3ML SOLN Take 3 mLs by nebulization every 6 (six) hours as needed. 360 mL 1   latanoprost (XALATAN) 0.005 % ophthalmic solution Place 1 drop into both  eyes at bedtime.     lidocaine-prilocaine (EMLA) cream Apply 1 Application topically as needed. 30 g 3   metFORMIN (GLUCOPHAGE) 500 MG tablet Take 1 tablet (500 mg total) by mouth 2 (two) times daily with a meal. 60 tablet 2   montelukast (SINGULAIR) 10 MG  tablet TAKE 1 TABLET(10 MG) BY MOUTH AT BEDTIME 90 tablet 0   Multiple Vitamin (MULTIVITAMIN WITH MINERALS) TABS tablet Take 1 tablet by mouth daily.     naloxone (NARCAN) nasal spray 4 mg/0.1 mL Place 1 spray into the nose once.     pregabalin (LYRICA) 50 MG capsule Take 1 capsule (50 mg total) by mouth 2 (two) times daily. 60 capsule 2   prochlorperazine (COMPAZINE) 10 MG tablet TAKE 1 TABLET(10 MG) BY MOUTH EVERY 6 HOURS AS NEEDED FOR NAUSEA OR VOMITING 30 tablet 1   propranolol ER (INDERAL LA) 60 MG 24 hr capsule TAKE 1 CAPSULE(60 MG) BY MOUTH DAILY 30 capsule 3   Respiratory Therapy Supplies (NEBULIZER) DEVI Use as directed 1 each 0   tamsulosin (FLOMAX) 0.4 MG CAPS capsule Take 0.4 mg by mouth daily after supper.     triamcinolone ointment (KENALOG) 0.5 % Apply 1 Application topically 2 (two) times daily. 30 g 1   venlafaxine XR (EFFEXOR-XR) 75 MG 24 hr capsule Take 225 mg by mouth daily with breakfast.     vitamin B-12 (CYANOCOBALAMIN) 500 MCG tablet Take 1 tablet by mouth daily.     eszopiclone (LUNESTA) 1 MG TABS tablet TAKE 1 TABLET(1 MG) BY MOUTH AT BEDTIME AS NEEDED FOR SLEEP 30 tablet 0   fentaNYL (DURAGESIC) 25 MCG/HR Place 1 patch onto the skin every 3 (three) days. 10 patch 0   olmesartan (BENICAR) 40 MG tablet Take 1 tablet (40 mg total) by mouth every morning. (Patient not taking: Reported on 02/22/2023) 30 tablet 3   oxyCODONE (OXY IR/ROXICODONE) 5 MG immediate release tablet Take 1 tablet (5 mg total) by mouth every 8 (eight) hours as needed for severe pain. 90 tablet 0   No current facility-administered medications for this visit.   Facility-Administered Medications Ordered in Other Visits  Medication Dose Route Frequency  Provider Last Rate Last Admin   heparin lock flush 100 UNIT/ML injection            heparin lock flush 100 UNIT/ML injection            heparin lock flush 100 UNIT/ML injection            potassium chloride 20 mEq in 100 mL IVPB  20 mEq Intravenous Once Louretta Shorten R, MD 100 mL/hr at 03/22/23 1056 20 mEq at 03/22/23 1056     PHYSICAL EXAMINATION: ECOG PERFORMANCE STATUS: 1 - Symptomatic but completely ambulatory  Vitals:   03/22/23 0928  BP: 135/77  Pulse: 85  Temp: 97.9 F (36.6 C)  SpO2: 98%      Filed Weights   03/22/23 0928  Weight: 162 lb (73.5 kg)    Physical Exam Vitals and nursing note reviewed.  HENT:     Head: Normocephalic and atraumatic.     Mouth/Throat:     Pharynx: Oropharynx is clear.  Eyes:     Extraocular Movements: Extraocular movements intact.     Pupils: Pupils are equal, round, and reactive to light.  Cardiovascular:     Rate and Rhythm: Normal rate and regular rhythm.  Pulmonary:     Comments: Decreased breath sounds bilaterally.  Abdominal:     Palpations: Abdomen is soft.  Musculoskeletal:        General: Normal range of motion.     Cervical back: Normal range of motion.  Skin:    General: Skin is warm.  Neurological:     General: No focal deficit present.     Mental  Status: He is alert and oriented to person, place, and time.  Psychiatric:        Behavior: Behavior normal.        Judgment: Judgment normal.    LABORATORY DATA:  I have reviewed the data as listed Lab Results  Component Value Date   WBC 6.9 03/22/2023   HGB 11.1 (L) 03/22/2023   HCT 34.0 (L) 03/22/2023   MCV 87.6 03/22/2023   PLT 180 03/22/2023   Recent Labs    01/25/23 0939 02/08/23 0950 02/22/23 0944 03/08/23 0959 03/22/23 0940  NA 137   < > 138 138 139  K 3.1*   < > 3.3* 3.5 3.3*  CL 100   < > 99 102 102  CO2 27   < > 26 26 27   GLUCOSE 142*   < > 186* 176* 154*  BUN 10   < > 10 9 10   CREATININE 1.03   < > 1.08 0.92 1.00  CALCIUM 8.4*   <  > 8.8* 8.6* 8.7*  GFRNONAA >60   < > >60 >60 >60  PROT 6.9  --  7.0  --  6.8  ALBUMIN 3.4*  --  3.5  --  3.4*  AST 15  --  23  --  15  ALT 9  --  6  --  6  ALKPHOS 95  --  101  --  95  BILITOT 0.3  --  0.3  --  0.4   < > = values in this interval not displayed.    RADIOGRAPHIC STUDIES: I have personally reviewed the radiological images as listed and agreed with the findings in the report. No results found.  ASSESSMENT & PLAN:   Primary cancer of right upper lobe of lung (HCC) # UNRESECTABLE/LOCALLY ADVANCED- Right upper lobe lung mass -concerning for malignancy [biopsy inconclusive/atypical cells]-  April 2023- PD-L1 positive [cirucologene].  PET-December 2023 concern for worsening mediastinal lymphadenopathy.  Bronchoscopy/EBUS [JAN 26th, 2024- Dr.A]- BAL/ 4R-lymph node FNA negative for malignancy.  Patient declined further invasive procedures.  Currently status post Keytruda- 12th, APRIL 2024-stable right upper necrotic mass noted with extension through the chest wall laterally with erosion of adjacent ribs and pathologic fracture.; slight decrease in size of the mediastinal lymph node. APRIL 2024-patient discontinued Keytruda after 3 cycles-given extreme poor tolerance worsening fatigue. Stalbe.   # continue surveillance off therapy- on a clinical basis. Will plan imaging in  July, 2024- will order CT scan today.  Patient however wants to continue symptomatic management with IV fluids; supportive care as needed.stable  # Anemia- mild to moderate- feb 2024- iron sat- 17%; ferritin- 61- recommend venofer weekly x3- stable; monitor for now.  stable   #Right chest wall pain-/pleuritic-rib destruction malignancy versus radiation- ADDED back [march 2024; [name]]-fenatny patch 25 mcg- Continue Oxycodone 5 mg every 8 to 12 hours hours as needed overall-  stable; if worse will increase the dose to 50 mcg fentanyl.    # COPD/fatigue- [coughing/phlegm/ no fevers]- on albuterol/advair [smoking]- TSH-  WNL;Continue  Xopenex/ipratropium nebs-; Mucinex- DM BID. stable.   # Hypokalemia-3.3 - continue potassium 20 mEq liquid.-Hypocalemia: 8.4/albumin- 3.2. MARCH 2024-vit D 105- HOLD off -Vit D 50,K; will check in 2-3 months- stable.   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 Lemar Livings. CT APRIL- 2024- mass lesion identified along the cecum obstructing the orifice of the appendix.  Clinically stable continue surveillance given patient's multiple comorbidities/reluctance  with any surgical options.stable.   # Poorly controlled diabetes-blood sugars 113- 280;  Continue glipizide; and  Metformin. stable.   # IV mediport: functioning/stable.   # Insomnia-continue Lunesta refilled- stable.   *appt thru mychart   # DISPOSITION: #  6 /20-Thursday IVFs-1 lit /1 hour  # IVFs over 1 hour. Today- KCL 20 IV.   # in 1 week- IVFs-1 lit /1 hourTuesdays/Thursday;     # in 2 weeks- lab-  IVFs-1 lit /1 hourTuesdays/Thursday   # # 3 weeks-  IVFs-1 lit /1 hourTuesdays/Thursday  # follow up in 4 week Tuesday- MD; labs- cbc/cmp; TSH;  IVFs-1 lit /1 hour;KCL 20 IV; CT scan Chest-  #  7/ 18 -Thursday IVFs-1 lit /1 hour - Dr.B   All questions were answered. The patient knows to call the clinic with any problems, questions or concerns.    [name], MD 03/22/2023 11:04 AM

## 2023-03-22 NOTE — Progress Notes (Signed)
Refills on oxy, lunesta and pain patches.  Pt would like to know if needs scan in June/July?

## 2023-03-24 ENCOUNTER — Inpatient Hospital Stay: Payer: Medicare PPO

## 2023-03-24 VITALS — BP 145/77 | HR 76 | Temp 97.9°F | Resp 20 | Wt 161.2 lb

## 2023-03-24 DIAGNOSIS — C3411 Malignant neoplasm of upper lobe, right bronchus or lung: Secondary | ICD-10-CM | POA: Diagnosis not present

## 2023-03-24 MED ORDER — HEPARIN SOD (PORK) LOCK FLUSH 100 UNIT/ML IV SOLN
500.0000 [IU] | Freq: Once | INTRAVENOUS | Status: AC | PRN
  Administered 2023-03-24: 500 [IU]
  Filled 2023-03-24: qty 5

## 2023-03-24 MED ORDER — SODIUM CHLORIDE 0.9 % IV SOLN
Freq: Once | INTRAVENOUS | Status: AC
  Filled 2023-03-24: qty 250

## 2023-03-24 MED ORDER — SODIUM CHLORIDE 0.9% FLUSH
10.0000 mL | Freq: Once | INTRAVENOUS | Status: AC | PRN
  Administered 2023-03-24: 10 mL
  Filled 2023-03-24: qty 10

## 2023-03-29 ENCOUNTER — Inpatient Hospital Stay: Payer: Medicare PPO

## 2023-03-29 VITALS — BP 118/72 | HR 93 | Temp 96.7°F | Resp 20 | Wt 161.4 lb

## 2023-03-29 DIAGNOSIS — C3411 Malignant neoplasm of upper lobe, right bronchus or lung: Secondary | ICD-10-CM

## 2023-03-29 MED ORDER — SODIUM CHLORIDE 0.9 % IV SOLN
Freq: Once | INTRAVENOUS | Status: AC
  Filled 2023-03-29: qty 250

## 2023-03-29 MED ORDER — HEPARIN SOD (PORK) LOCK FLUSH 100 UNIT/ML IV SOLN
500.0000 [IU] | Freq: Once | INTRAVENOUS | Status: AC | PRN
  Administered 2023-03-29: 500 [IU]
  Filled 2023-03-29: qty 5

## 2023-03-29 MED ORDER — SODIUM CHLORIDE 0.9% FLUSH
10.0000 mL | Freq: Once | INTRAVENOUS | Status: AC | PRN
  Administered 2023-03-29: 10 mL
  Filled 2023-03-29: qty 10

## 2023-03-31 ENCOUNTER — Inpatient Hospital Stay: Payer: Medicare PPO

## 2023-03-31 VITALS — BP 139/74 | HR 76 | Temp 96.9°F | Resp 20

## 2023-03-31 DIAGNOSIS — C3411 Malignant neoplasm of upper lobe, right bronchus or lung: Secondary | ICD-10-CM

## 2023-03-31 MED ORDER — SODIUM CHLORIDE 0.9 % IV SOLN
Freq: Once | INTRAVENOUS | Status: AC
  Filled 2023-03-31: qty 250

## 2023-03-31 MED ORDER — HEPARIN SOD (PORK) LOCK FLUSH 100 UNIT/ML IV SOLN
500.0000 [IU] | Freq: Once | INTRAVENOUS | Status: AC | PRN
  Administered 2023-03-31: 500 [IU]
  Filled 2023-03-31: qty 5

## 2023-03-31 MED ORDER — SODIUM CHLORIDE 0.9% FLUSH
10.0000 mL | Freq: Once | INTRAVENOUS | Status: AC | PRN
  Administered 2023-03-31: 10 mL
  Filled 2023-03-31: qty 10

## 2023-04-05 ENCOUNTER — Inpatient Hospital Stay: Payer: Medicare PPO | Attending: Internal Medicine

## 2023-04-05 VITALS — BP 113/70 | HR 78 | Temp 96.8°F | Resp 20 | Wt 163.0 lb

## 2023-04-05 DIAGNOSIS — I700 Atherosclerosis of aorta: Secondary | ICD-10-CM | POA: Insufficient documentation

## 2023-04-05 DIAGNOSIS — Z79899 Other long term (current) drug therapy: Secondary | ICD-10-CM | POA: Insufficient documentation

## 2023-04-05 DIAGNOSIS — D3501 Benign neoplasm of right adrenal gland: Secondary | ICD-10-CM | POA: Diagnosis not present

## 2023-04-05 DIAGNOSIS — J449 Chronic obstructive pulmonary disease, unspecified: Secondary | ICD-10-CM | POA: Diagnosis not present

## 2023-04-05 DIAGNOSIS — C3411 Malignant neoplasm of upper lobe, right bronchus or lung: Secondary | ICD-10-CM | POA: Insufficient documentation

## 2023-04-05 DIAGNOSIS — E876 Hypokalemia: Secondary | ICD-10-CM | POA: Diagnosis not present

## 2023-04-05 DIAGNOSIS — Z7984 Long term (current) use of oral hypoglycemic drugs: Secondary | ICD-10-CM | POA: Insufficient documentation

## 2023-04-05 DIAGNOSIS — J432 Centrilobular emphysema: Secondary | ICD-10-CM | POA: Diagnosis not present

## 2023-04-05 DIAGNOSIS — E1165 Type 2 diabetes mellitus with hyperglycemia: Secondary | ICD-10-CM | POA: Diagnosis not present

## 2023-04-05 DIAGNOSIS — Z8614 Personal history of Methicillin resistant Staphylococcus aureus infection: Secondary | ICD-10-CM | POA: Insufficient documentation

## 2023-04-05 DIAGNOSIS — I10 Essential (primary) hypertension: Secondary | ICD-10-CM | POA: Diagnosis not present

## 2023-04-05 DIAGNOSIS — F17210 Nicotine dependence, cigarettes, uncomplicated: Secondary | ICD-10-CM | POA: Diagnosis not present

## 2023-04-05 MED ORDER — SODIUM CHLORIDE 0.9 % IV SOLN
Freq: Once | INTRAVENOUS | Status: AC
  Filled 2023-04-05: qty 250

## 2023-04-05 MED ORDER — SODIUM CHLORIDE 0.9% FLUSH
10.0000 mL | Freq: Once | INTRAVENOUS | Status: AC | PRN
  Administered 2023-04-05: 10 mL
  Filled 2023-04-05: qty 10

## 2023-04-05 MED ORDER — HEPARIN SOD (PORK) LOCK FLUSH 100 UNIT/ML IV SOLN
500.0000 [IU] | Freq: Once | INTRAVENOUS | Status: AC | PRN
  Administered 2023-04-05: 500 [IU]
  Filled 2023-04-05: qty 5

## 2023-04-08 ENCOUNTER — Inpatient Hospital Stay: Payer: Medicare PPO

## 2023-04-08 VITALS — BP 121/66 | HR 81 | Temp 97.6°F | Resp 18

## 2023-04-08 DIAGNOSIS — C3411 Malignant neoplasm of upper lobe, right bronchus or lung: Secondary | ICD-10-CM

## 2023-04-08 DIAGNOSIS — Z95828 Presence of other vascular implants and grafts: Secondary | ICD-10-CM

## 2023-04-08 MED ORDER — HEPARIN SOD (PORK) LOCK FLUSH 100 UNIT/ML IV SOLN
500.0000 [IU] | Freq: Once | INTRAVENOUS | Status: AC
  Administered 2023-04-08: 500 [IU]
  Filled 2023-04-08: qty 5

## 2023-04-08 MED ORDER — SODIUM CHLORIDE 0.9 % IV SOLN
Freq: Once | INTRAVENOUS | Status: AC
  Filled 2023-04-08: qty 250

## 2023-04-08 MED ORDER — SODIUM CHLORIDE 0.9% FLUSH
10.0000 mL | Freq: Once | INTRAVENOUS | Status: AC
  Administered 2023-04-08: 10 mL via INTRAVENOUS
  Filled 2023-04-08: qty 10

## 2023-04-12 ENCOUNTER — Inpatient Hospital Stay: Payer: Medicare PPO

## 2023-04-12 VITALS — BP 129/76 | HR 87 | Temp 97.2°F | Resp 20 | Wt 159.0 lb

## 2023-04-12 DIAGNOSIS — C3411 Malignant neoplasm of upper lobe, right bronchus or lung: Secondary | ICD-10-CM

## 2023-04-12 MED ORDER — SODIUM CHLORIDE 0.9 % IV SOLN
Freq: Once | INTRAVENOUS | Status: AC
  Filled 2023-04-12: qty 250

## 2023-04-12 MED ORDER — HEPARIN SOD (PORK) LOCK FLUSH 100 UNIT/ML IV SOLN
500.0000 [IU] | Freq: Once | INTRAVENOUS | Status: AC | PRN
  Administered 2023-04-12: 500 [IU]
  Filled 2023-04-12: qty 5

## 2023-04-12 MED ORDER — SODIUM CHLORIDE 0.9% FLUSH
10.0000 mL | Freq: Once | INTRAVENOUS | Status: AC | PRN
  Administered 2023-04-12: 10 mL
  Filled 2023-04-12: qty 10

## 2023-04-12 MED FILL — Dexamethasone Sodium Phosphate Inj 100 MG/10ML: INTRAMUSCULAR | Qty: 1 | Status: AC

## 2023-04-13 ENCOUNTER — Ambulatory Visit
Admission: RE | Admit: 2023-04-13 | Discharge: 2023-04-13 | Disposition: A | Discharge status: Home / Self Care | Payer: Medicare PPO | Source: Ambulatory Visit | Attending: Internal Medicine | Admitting: Internal Medicine

## 2023-04-13 DIAGNOSIS — C3411 Malignant neoplasm of upper lobe, right bronchus or lung: Secondary | ICD-10-CM | POA: Diagnosis present

## 2023-04-13 MED ORDER — IOHEXOL 300 MG/ML  SOLN
75.0000 mL | Freq: Once | INTRAMUSCULAR | Status: AC | PRN
  Administered 2023-04-13: 75 mL via INTRAVENOUS

## 2023-04-13 MED FILL — Dexamethasone Sodium Phosphate Inj 100 MG/10ML: INTRAMUSCULAR | Qty: 1 | Status: AC

## 2023-04-14 ENCOUNTER — Inpatient Hospital Stay: Payer: Medicare PPO

## 2023-04-14 VITALS — BP 149/79 | HR 109 | Temp 98.2°F | Resp 20 | Wt 161.0 lb

## 2023-04-14 DIAGNOSIS — C3411 Malignant neoplasm of upper lobe, right bronchus or lung: Secondary | ICD-10-CM

## 2023-04-14 MED ORDER — SODIUM CHLORIDE 0.9% FLUSH
10.0000 mL | Freq: Once | INTRAVENOUS | Status: AC | PRN
  Administered 2023-04-14: 10 mL
  Filled 2023-04-14: qty 10

## 2023-04-14 MED ORDER — HEPARIN SOD (PORK) LOCK FLUSH 100 UNIT/ML IV SOLN
500.0000 [IU] | Freq: Once | INTRAVENOUS | Status: AC | PRN
  Administered 2023-04-14: 500 [IU]
  Filled 2023-04-14: qty 5

## 2023-04-14 MED ORDER — SODIUM CHLORIDE 0.9 % IV SOLN
Freq: Once | INTRAVENOUS | Status: AC
  Filled 2023-04-14: qty 250

## 2023-04-20 ENCOUNTER — Inpatient Hospital Stay: Payer: Medicare PPO

## 2023-04-20 ENCOUNTER — Encounter: Payer: Self-pay | Admitting: Internal Medicine

## 2023-04-20 ENCOUNTER — Inpatient Hospital Stay: Payer: Medicare PPO | Admitting: Internal Medicine

## 2023-04-20 VITALS — Wt 159.4 lb

## 2023-04-20 DIAGNOSIS — C3411 Malignant neoplasm of upper lobe, right bronchus or lung: Secondary | ICD-10-CM

## 2023-04-20 LAB — CMP (CANCER CENTER ONLY)
ALT: 6 U/L (ref 0–44)
AST: 12 U/L — ABNORMAL LOW (ref 15–41)
Albumin: 3.4 g/dL — ABNORMAL LOW (ref 3.5–5.0)
Alkaline Phosphatase: 88 U/L (ref 38–126)
Anion gap: 8 (ref 5–15)
BUN: 9 mg/dL (ref 8–23)
CO2: 27 mmol/L (ref 22–32)
Calcium: 8.6 mg/dL — ABNORMAL LOW (ref 8.9–10.3)
Chloride: 101 mmol/L (ref 98–111)
Creatinine: 1 mg/dL (ref 0.61–1.24)
GFR, Estimated: >60 mL/min (ref >60)
Glucose, Bld: 170 mg/dL — ABNORMAL HIGH (ref 70–99)
Potassium: 3.2 mmol/L — ABNORMAL LOW (ref 3.5–5.1)
Sodium: 136 mmol/L (ref 135–145)
Total Bilirubin: 0.4 mg/dL (ref 0.3–1.2)
Total Protein: 6.9 g/dL (ref 6.5–8.1)

## 2023-04-20 LAB — CBC WITH DIFFERENTIAL (CANCER CENTER ONLY)
Abs Immature Granulocytes: 0.03 10*3/uL (ref 0.00–0.07)
Basophils Absolute: 0.1 10*3/uL (ref 0.0–0.1)
Basophils Relative: 1 %
Eosinophils Absolute: 0.4 10*3/uL (ref 0.0–0.5)
Eosinophils Relative: 5 %
HCT: 33.6 % — ABNORMAL LOW (ref 39.0–52.0)
Hemoglobin: 11.2 g/dL — ABNORMAL LOW (ref 13.0–17.0)
Immature Granulocytes: 0 %
Lymphocytes Relative: 13 %
Lymphs Abs: 1 10*3/uL (ref 0.7–4.0)
MCH: 29.4 pg (ref 26.0–34.0)
MCHC: 33.3 g/dL (ref 30.0–36.0)
MCV: 88.2 fL (ref 80.0–100.0)
Monocytes Absolute: 0.5 10*3/uL (ref 0.1–1.0)
Monocytes Relative: 7 %
Neutro Abs: 5.5 10*3/uL (ref 1.7–7.7)
Neutrophils Relative %: 74 %
Platelet Count: 193 10*3/uL (ref 150–400)
RBC: 3.81 MIL/uL — ABNORMAL LOW (ref 4.22–5.81)
RDW: 14.8 % (ref 11.5–15.5)
WBC Count: 7.4 10*3/uL (ref 4.0–10.5)
nRBC: 0 % (ref 0.0–0.2)

## 2023-04-20 LAB — TSH: TSH: 5.429 u[IU]/mL — ABNORMAL HIGH (ref 0.350–4.500)

## 2023-04-20 MED ORDER — SODIUM CHLORIDE 0.9 % IV SOLN
Freq: Once | INTRAVENOUS | Status: AC
  Filled 2023-04-20: qty 250

## 2023-04-20 MED ORDER — SODIUM CHLORIDE 0.9% FLUSH
10.0000 mL | Freq: Once | INTRAVENOUS | Status: AC
  Administered 2023-04-20: 10 mL via INTRAVENOUS
  Filled 2023-04-20: qty 10

## 2023-04-20 MED ORDER — FENTANYL 25 MCG/HR TD PT72
1.0000 | MEDICATED_PATCH | TRANSDERMAL | 0 refills | Status: DC
Start: 2023-04-20 — End: 2023-05-17

## 2023-04-20 MED ORDER — POTASSIUM CHLORIDE 20 MEQ/100ML IV SOLN
20.0000 meq | Freq: Once | INTRAVENOUS | Status: AC
  Administered 2023-04-20: 20 meq via INTRAVENOUS

## 2023-04-20 MED ORDER — ESZOPICLONE 1 MG PO TABS
ORAL_TABLET | ORAL | 0 refills | Status: DC
Start: 2023-04-20 — End: 2023-05-17

## 2023-04-20 MED ORDER — OXYCODONE HCL 5 MG PO TABS
5.0000 mg | ORAL_TABLET | Freq: Three times a day (TID) | ORAL | 0 refills | Status: DC | PRN
Start: 2023-04-20 — End: 2023-05-17

## 2023-04-20 MED ORDER — HEPARIN SOD (PORK) LOCK FLUSH 100 UNIT/ML IV SOLN
500.0000 [IU] | Freq: Once | INTRAVENOUS | Status: AC | PRN
  Administered 2023-04-20: 500 [IU]
  Filled 2023-04-20: qty 5

## 2023-04-20 NOTE — Assessment & Plan Note (Addendum)
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   Currently OFF therapy/on surveillance [given poor tolerance to Keytruda]. 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  Ill-defined peribronchovascular ground-glass nodularity throughout the lungs, right greater than left. Endobronchial spread of tumor can have this appearance. Enlarging left hilar lymph node, worrisome for a metastasis. Destruction and pathologic fractures involving the right third through sixth ribs, as before.  I reviewed imaging with Dr.Aleskerov; pulmonary.  See discussion below regarding prognosis  # continue surveillance off therapy- on a clinical basis.  Will plan to continue surveillance every 3 months imaging also.  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   # COPD/fatigue- [coughing/phlegm/ no fevers]- on albuterol/advair [smoking]- TSH- WNL;Continue  Xopenex/ipratropium nebs-; Mucinex- DM BID.stable.   # Hypokalemia-3.3 - continue potassium 20 mEq liquid.-Hypocalemia: 8.4/albumin- 3.2. MARCH 2024-vit D 105- HOLD off -Vit D 50,K- stable.   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 Lemar Livings. CT APRIL- 2024- mass lesion identified along the cecum obstructing the orifice of the appendix.  Clinically stable continue surveillance given patient's multiple  comorbidities/reluctance with any surgical options.stable.   # Poorly controlled diabetes-blood sugars 113- 280;  Continue glipizide; and  Metformin. stable.   # IV mediport: functioning/stable.   # Insomnia-continue Lunesta refilled- stable.   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  However does not show any life-threatening progression at this time.  Patient again a poor candidate for any systemic therapy given his poor tolerance to therapy.  Will continue to monitor every 3 months of imaging.   *appt thru mychart   # DISPOSITION: # IVFs over 1 hour. Today- KCL 20 IV.   # in 1 week- IVFs-1 lit /1 hourTuesdays/Thursday;     # in 2 weeks- lab-  IVFs-1 lit /1 hourTuesdays/Thursday   # # 3 weeks-  IVFs-1 lit /1 hourTuesdays/Thursday  # follow up in 4 week Tuesday- MD; labs- cbc/cmp; TSH;  IVFs-1 lit /1 hour;KCL 20 IV; - Thursday IVFs-1 lit /1 hour - Dr.B  # I reviewed the blood work- with the patient in detail; also reviewed the imaging independently [as summarized above]; and with the patient in detail.

## 2023-04-20 NOTE — Progress Notes (Signed)
[facility] CONSULT NOTE  Patient Care Team: [name], MD as PCP - General (Family Medicine) [name], RN as Oncology Nurse Navigator [name], [name], MD as Consulting Physician (Urology) [name], MD as Consulting Physician (Oncology) [name], MD as Consulting Physician (Pulmonary Disease)  CHIEF COMPLAINTS/PURPOSE OF CONSULTATION: lung cancer    Oncology History Overview Note  AUG 2022- 8.2 x 5.9 cm spiculated mass noted in right upper lobe consistent with malignancy. It extends from the right hilum to the lateral chest wall and results in lytic destruction of the lateral portion of the right fourth rib.  Mediastinal lymph nodes are noted, including 12 mm right hilar lymph node, consistent with metastatic disease. 3 cm right adrenal mass is noted concerning for metastatic disease.  # AUG 2022- PET IMPRESSION: Peripherally hypermetabolic right upper lobe mass, likely due to central necrosis. Mass extends into the right hilum and right lateral chest wall involving the second through fourth right ribs.   Mildly enlarged and hypermetabolic right hilar lymph node.   No evidence of metastatic disease in the abdomen or pelvis.  # AUG, 2022-RIGHT CHEST WALL Bx-necrosis; suspicious for malignancy not definitive [discussed with Dr.Kraynie;MDT] LUNG MASS, RIGHT; BIOPSY:  - SUSPICIOUS FOR MALIGNANCY.  - SEE COMMENT.   Comment:  Approximately six tissue cores demonstrate inflamed fibrous capsule with  hemosiderin-laden macrophages, in a background of abundant necrosis.  One of the cores demonstrates a minute fragment of viable epithelial  cells.  These cells are positive for p40.  They are negative for TTF1  and CK7. The cells of interest disappear on deeper cut levels.  While  the findings are suspicious for squamous cell carcinoma, there is not  enough viable tumor for a definitive diagnosis.  Additional tissue  sampling may be helpful.    # SEP 2022-cycle #2 Taxol hypersensitive reaction.  Switched over to #3 cycle- carbo-Abraxane starting 06/29/2021.  Discontinued IMFINZI therapy-December 2022 given poor tolerance/pneumonia admission to hospital.  #  [name] was in May-June 2023 x3 doses; stopped because of intolerance-fatigue patient preference/lack of progression.  # JAN 2024-bronchoscopy biopsy negative for malignancy; however PET scan concerning for recurrence/progression.    May 05, 2022-s/p bronchoscopy- with Dr.Aleskerov-significant purulent/necrotic debris noted. Dec 23rd, 2023- PET scan: The cavitary process in the right upper lobe is similar in size to previous CTS, although demonstrates prominent peripheral hypermetabolic activity. 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 Hypermetabolic activity within the adjacent upper right lateral chest wall with right 3rd through 5th rib deformities, suspicious for direct tumor spread. No evidence of distant osseous metastatic disease; Hypermetabolic left hilar lymph node worrisome for metastatic disease. No other hypermetabolic lymph nodes or distant metastases identified.  Bronchoscopy/EBUS [JAN 26th, 2024- Dr.A]- BAL/ 4R-lymph node FNA negative for malignancy.  Patient declined further invasive procedures.  # FEB 15t, 2024- RE-Start Keytruda.   # DEC 2022- s/p ID- Dr.ravishankar- ? Lung abscess-no antibiotics-  JAN 2023- CT scan incidental- cecal mass- colonoscopy[Dr.Russo; [facility]- high grade dysplasia]  S/p evaluation with Dr. Sherryle Lis surgery for now]   # S/p evaluation with Dr. Sheppard Penton.   Primary cancer of right upper lobe of lung (HCC)  06/03/2021 Initial Diagnosis   Primary cancer of right upper lobe of lung (HCC)   06/03/2021 Cancer Staging   Staging form: Lung, AJCC 8th Edition - Clinical: Stage IIIB (cT3, cN2, cM0) - Signed by  Louretta Shorten  R, MD on 06/03/2021   06/15/2021 - 08/03/2021 Chemotherapy   Patient is on Treatment Plan : LUNG Carboplatin / Paclitaxel + XRT q7d     10/02/2021 - 10/19/2021 Chemotherapy   Patient is on Treatment Plan : LUNG Durvalumab q14d     02/03/2022 -  Chemotherapy   Patient is on Treatment Plan : LUNG NSCLC Pembrolizumab (200) q21d     02/04/2022 - 03/18/2022 Chemotherapy   Patient is on Treatment Plan : LUNG NSCLC Pembrolizumab (200) q21d      HISTORY OF PRESENTING ILLNESS: Patient is ambulating independently.  Accompanied by wife.  Rodney Vega 67 y.o.  male history of smoking -"lung cancer" [Limited necrotic tissue]-locally advanced unresectable- currently on surveillance [APRIL 2024-Keytruda was discontinued because of poor tolerance/patient preference] ; and Cecal mass-high grade dysplasia intact-  is here for follow-up/a nd review the results of CT scan.   Patient has not had any further hospitalizations.  Patient currently taking oxycodone 2 to 3 pills a day and continues to be on fentanyl patch for his right-sided chest wall pain.  Notes have intermittent episodes of difficulty breathing currently stable.  He continues to get IV fluids-twice a week-feels improved after IV infusions. Denies abdominal pain nausea vomiting or diarrhea.  No blood in stools black or stools.  Review of Systems  Constitutional:  Positive for malaise/fatigue and weight loss. Negative for chills, diaphoresis and fever.  HENT:  Negative for nosebleeds and sore throat.   Eyes:  Negative for double vision.  Respiratory:  Positive for cough, sputum production and shortness of breath. Negative for hemoptysis and wheezing.   Cardiovascular:  Positive for chest pain. Negative for palpitations, orthopnea and leg swelling.  Gastrointestinal:  Positive for nausea. Negative for abdominal pain, blood in stool, constipation, diarrhea, heartburn, melena and vomiting.  Genitourinary:  Negative for dysuria,  frequency and urgency.  Musculoskeletal:  Negative for back pain and joint pain.  Skin: Negative.  Negative for itching and rash.  Neurological:  Positive for tingling. Negative for dizziness, focal weakness, weakness and headaches.  Endo/Heme/Allergies:  Does not bruise/bleed easily.  Psychiatric/Behavioral:  Negative for depression. The patient is not nervous/anxious and does not have insomnia.      MEDICAL HISTORY:  Past Medical History:  Diagnosis Date   Acute on chronic respiratory failure (HCC)    Anemia    Cancer (HCC)    Colostomy present (HCC)    COPD (chronic obstructive pulmonary disease) (HCC)    Degenerative arthritis of knee    Depression    Diabetes mellitus without complication (HCC)    Diverticulitis    Dyspnea    Glaucoma    since 2004   Hernia 1979   History of kidney stones    History of methicillin resistant staphylococcus aureus (MRSA)    Hypertension    Hypokalemia    Marijuana use    Mass of cecum    Nephrolithiasis    Neuromuscular disorder (HCC)    Personal history of tobacco use, presenting hazards to health    Pneumonia    Port-A-Cath in place    Pre-diabetes    Primary cancer of right upper lobe of lung (HCC)    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)    Screening for obesity    Severe sepsis (HCC)    Special screening for malignant neoplasms, colon    Tobacco use    Wears dentures    full upper and lower    SURGICAL HISTORY: Past Surgical History:  Procedure Laterality Date   BACK SURGERY  1994, 2012   lumbar bulging disc   BRONCHOSCOPY  05/05/2022   CATARACT EXTRACTION W/PHACO Left 01/20/2021   Procedure: CATARACT EXTRACTION PHACO AND INTRAOCULAR LENS PLACEMENT (IOC) LEFT;  Surgeon: Galen Manila, MD;  Location: [facility];  Service: Ophthalmology;  Laterality: Left;  10.13 0:52.1   COLONOSCOPY  [id number]   [facility]/Dr. Carmell Austria sessile polyp,39mm, found in rectum,multiple diverticula were found in the sigmoid, in  descending and in transverse colon   COLONOSCOPY WITH PROPOFOL N/A 10/22/2021   Procedure: COLONOSCOPY WITH PROPOFOL;  Surgeon: Jaynie Collins, DO;  Location: [facility];  Service: Gastroenterology;  Laterality: N/A;  DM   COLOSTOMY  04/20/2013   EYE SURGERY     HERNIA REPAIR  1979   inguinal   IR IMAGING GUIDED PORT INSERTION  06/10/2021   JOINT REPLACEMENT Right    knee   KNEE ARTHROPLASTY Right 05/08/2018   Procedure: COMPUTER ASSISTED TOTAL KNEE ARTHROPLASTY;  Surgeon: Donato Heinz, MD;  Location: [facility];  Service: Orthopedics;  Laterality: Right;   KNEE ARTHROPLASTY Left 11/16/2019   Procedure: COMPUTER ASSISTED TOTAL KNEE ARTHROPLASTY;  Surgeon: Donato Heinz, MD;  Location: [facility];  Service: Orthopedics;  Laterality: Left;   LAPAROTOMY  04/20/2013   colon resection with colosotmy   LITHOTRIPSY  2013   LUNG BIOPSY  2023   RIGID BRONCHOSCOPY N/A 10/29/2022   Procedure: RIGID BRONCHOSCOPY;  Surgeon: [name], MD;  Location: [facility];  Service: Thoracic;  Laterality: N/A;   TONSILLECTOMY AND ADENOIDECTOMY  1962   VIDEO BRONCHOSCOPY WITH ENDOBRONCHIAL NAVIGATION N/A 05/05/2022   Procedure: VIDEO BRONCHOSCOPY WITH ENDOBRONCHIAL NAVIGATION;  Surgeon: [name], MD;  Location: [facility];  Service: Thoracic;  Laterality: N/A;   VIDEO BRONCHOSCOPY WITH ENDOBRONCHIAL ULTRASOUND N/A 05/05/2022   Procedure: VIDEO BRONCHOSCOPY WITH ENDOBRONCHIAL ULTRASOUND;  Surgeon: [name], MD;  Location: [facility];  Service: Thoracic;  Laterality: N/A;   VIDEO BRONCHOSCOPY WITH ENDOBRONCHIAL ULTRASOUND N/A 10/29/2022   Procedure: VIDEO BRONCHOSCOPY WITH ENDOBRONCHIAL ULTRASOUND;  Surgeon: [name], MD;  Location: [facility];  Service: Thoracic;  Laterality: N/A;    SOCIAL HISTORY: Social History   Socioeconomic History   Marital status: Married    Spouse name: Pegge   Number of children: Not on file   Years of education: Not on file   Highest education level: Not  on file  Occupational History   Not on file  Tobacco Use   Smoking status: Every Day    Current packs/day: 1.00    Average packs/day: 1 pack/day for 30.0 years (30.0 ttl pk-yrs)    Types: Cigarettes   Smokeless tobacco: Never   Tobacco comments:    since age 42 or 2  Vaping Use   Vaping status: Never Used  Substance and Sexual Activity   Alcohol use: Yes    Alcohol/week: 2.0 standard drinks of alcohol    Types: 2 Standard drinks or equivalent per week    Comment: rare   Drug use: Yes    Types: Marijuana    Comment: fentanyl patch prescribed & oxycodone   Sexual activity: Not on file  Other Topics Concern   Not on file  Social History Narrative   [place] /[place]; smoking; not much alcohol. Worked for [employer], 2019. Marland Kitchen    Social Determinants of Health   Financial Resource Strain: Low Risk  (08/10/2022)   Overall Financial Resource Strain (CARDIA)    Difficulty of Paying Living  Expenses: Not very hard  Food Insecurity: No Food Insecurity (01/20/2023)   Hunger Vital Sign    Worried About Running Out of Food in the Last Year: Never true    Ran Out of Food in the Last Year: Never true  Transportation Needs: No Transportation Needs (01/20/2023)   PRAPARE - Administrator, Civil Service (Medical): No    Lack of Transportation (Non-Medical): No  Physical Activity: Not on file  Stress: No Stress Concern Present (08/10/2022)   [employer] of Occupational Health - Occupational Stress Questionnaire    Feeling of Stress : Not at all  Social Connections: Unknown (08/10/2022)   Social Connection and Isolation Panel [NHANES]    Frequency of Communication with Friends and Family: Three times a week    Frequency of Social Gatherings with Friends and Family: Twice a week    Attends Religious Services: Patient declined    Database administrator or Organizations: No    Attends Banker Meetings: Never    Marital Status: Married  Careers information officer Violence: Not At Risk (01/20/2023)   Humiliation, Afraid, Rape, and Kick questionnaire    Fear of Current or Ex-Partner: No    Emotionally Abused: No    Physically Abused: No    Sexually Abused: No    FAMILY HISTORY: Family History  Problem Relation Age of Onset   Diabetes Mother    Heart disease Mother    Heart attack Father    Prostate cancer Father     ALLERGIES:  is allergic to paclitaxel, ambien [zolpidem], nsaids, and aleve [naproxen sodium].  MEDICATIONS:  Current Outpatient Medications  Medication Sig Dispense Refill   Accu-Chek Softclix Lancets lancets USE AS DIRECTED FOUR TIMES DAILY 100 each 12   acetaminophen (TYLENOL) 500 MG tablet Take 2 tablets (1,000 mg total) by mouth daily as needed. Home med. 30 tablet 0   albuterol (VENTOLIN HFA) 108 (90 Base) MCG/ACT inhaler SMARTSIG:2 inhalation Via Inhaler Every 6 Hours PRN 1 each 2   blood glucose meter kit and supplies KIT Check your blood glucose levels twice a day; once in the morning before breakfast; and once in the evening after dinner. 1 each 0   BREZTRI AEROSPHERE 160-9-4.8 MCG/ACT AERO Inhale 2 puffs into the lungs 2 (two) times daily.     dextromethorphan-guaiFENesin (MUCINEX DM) 30-600 MG 12hr tablet Take 1 tablet by mouth 2 (two) times daily as needed for cough.     dorzolamide-timolol (COSOPT) 22.3-6.8 MG/ML ophthalmic solution Place 1 drop into both eyes 2 (two) times daily.     glipiZIDE (GLUCOTROL) 5 MG tablet TAKE 1 TABLET(5 MG) BY MOUTH DAILY BEFORE BREAKFAST 90 tablet 2   glucose blood test strip Check your blood glucose levels twice a day; once in the morning before breakfast; and once in the evening after dinner. 100 each 12   ipratropium-albuterol (DUONEB) 0.5-2.5 (3) MG/3ML SOLN Take 3 mLs by nebulization every 6 (six) hours as needed. 360 mL 1   latanoprost (XALATAN) 0.005 % ophthalmic solution Place 1 drop into both eyes at bedtime.     lidocaine-prilocaine (EMLA) cream Apply 1 Application  topically as needed. 30 g 3   metFORMIN (GLUCOPHAGE) 500 MG tablet Take 1 tablet (500 mg total) by mouth 2 (two) times daily with a meal. 60 tablet 2   montelukast (SINGULAIR) 10 MG tablet TAKE 1 TABLET(10 MG) BY MOUTH AT BEDTIME 90 tablet 0   Multiple Vitamin (MULTIVITAMIN WITH MINERALS) TABS tablet Take 1  tablet by mouth daily.     naloxone (NARCAN) nasal spray 4 mg/0.1 mL Place 1 spray into the nose once.     pregabalin (LYRICA) 50 MG capsule Take 1 capsule (50 mg total) by mouth 2 (two) times daily. 60 capsule 2   prochlorperazine (COMPAZINE) 10 MG tablet TAKE 1 TABLET(10 MG) BY MOUTH EVERY 6 HOURS AS NEEDED FOR NAUSEA OR VOMITING 30 tablet 1   propranolol ER (INDERAL LA) 60 MG 24 hr capsule TAKE 1 CAPSULE(60 MG) BY MOUTH DAILY 30 capsule 3   Respiratory Therapy Supplies (NEBULIZER) DEVI Use as directed 1 each 0   tamsulosin (FLOMAX) 0.4 MG CAPS capsule Take 0.4 mg by mouth daily after supper.     triamcinolone ointment (KENALOG) 0.5 % Apply 1 Application topically 2 (two) times daily. 30 g 1   venlafaxine XR (EFFEXOR-XR) 75 MG 24 hr capsule Take 225 mg by mouth daily with breakfast.     vitamin B-12 (CYANOCOBALAMIN) 500 MCG tablet Take 1 tablet by mouth daily.     albuterol (PROVENTIL) (2.5 MG/3ML) 0.083% nebulizer solution Take 2.5 mg by nebulization every 4 (four) hours as needed for wheezing or shortness of breath. (Patient not taking: Reported on 04/20/2023)     eszopiclone (LUNESTA) 1 MG TABS tablet TAKE 1 TABLET(1 MG) BY MOUTH AT BEDTIME AS NEEDED FOR SLEEP 30 tablet 0   fentaNYL (DURAGESIC) 25 MCG/HR Place 1 patch onto the skin every 3 (three) days. 10 patch 0   olmesartan (BENICAR) 40 MG tablet Take 1 tablet (40 mg total) by mouth every morning. (Patient not taking: Reported on 02/22/2023) 30 tablet 3   oxyCODONE (OXY IR/ROXICODONE) 5 MG immediate release tablet Take 1 tablet (5 mg total) by mouth every 8 (eight) hours as needed for severe pain. 90 tablet 0   No current  facility-administered medications for this visit.   Facility-Administered Medications Ordered in Other Visits  Medication Dose Route Frequency Provider Last Rate Last Admin   heparin lock flush 100 UNIT/ML injection            heparin lock flush 100 UNIT/ML injection            heparin lock flush 100 UNIT/ML injection            heparin lock flush 100 unit/mL  500 Units Intracatheter Once PRN Louretta Shorten R, MD       potassium chloride 20 mEq in 100 mL IVPB  20 mEq Intravenous Once Louretta Shorten R, MD 100 mL/hr at 04/20/23 1113 20 mEq at 04/20/23 1113     PHYSICAL EXAMINATION: ECOG PERFORMANCE STATUS: 1 - Symptomatic but completely ambulatory  Vitals:   04/20/23 0943  BP: 125/78  Pulse: 88  Resp: 20  Temp: (!) 96 F (35.6 C)  SpO2: 97%   There were no vitals filed for this visit.   Physical Exam Vitals and nursing note reviewed.  HENT:     Head: Normocephalic and atraumatic.     Mouth/Throat:     Pharynx: Oropharynx is clear.  Eyes:     Extraocular Movements: Extraocular movements intact.     Pupils: Pupils are equal, round, and reactive to light.  Cardiovascular:     Rate and Rhythm: Normal rate and regular rhythm.  Pulmonary:     Comments: Decreased breath sounds bilaterally.  Abdominal:     Palpations: Abdomen is soft.  Musculoskeletal:        General: Normal range of motion.     Cervical back: Normal  range of motion.  Skin:    General: Skin is warm.  Neurological:     General: No focal deficit present.     Mental Status: He is alert and oriented to person, place, and time.  Psychiatric:        Behavior: Behavior normal.        Judgment: Judgment normal.    LABORATORY DATA:  I have reviewed the data as listed Lab Results  Component Value Date   WBC 7.4 04/20/2023   HGB 11.2 (L) 04/20/2023   HCT 33.6 (L) 04/20/2023   MCV 88.2 04/20/2023   PLT 193 04/20/2023   Recent Labs    02/22/23 0944 03/08/23 0959 03/22/23 0940 04/20/23 0937   NA 138 138 139 136  K 3.3* 3.5 3.3* 3.2*  CL 99 102 102 101  CO2 26 26 27 27   GLUCOSE 186* 176* 154* 170*  BUN 10 9 10 9   CREATININE 1.08 0.92 1.00 1.00  CALCIUM 8.8* 8.6* 8.7* 8.6*  GFRNONAA >60 >60 >60 >60  PROT 7.0  --  6.8 6.9  ALBUMIN 3.5  --  3.4* 3.4*  AST 23  --  15 12*  ALT 6  --  6 6  ALKPHOS 101  --  95 88  BILITOT 0.3  --  0.4 0.4    RADIOGRAPHIC STUDIES: I have personally reviewed the radiological images as listed and agreed with the findings in the report. CT CHEST W CONTRAST  Result Date: 04/18/2023 CLINICAL DATA:  Non-small cell lung cancer, assess treatment response. Radiation therapy completed in 2022. History of colon cancer. * Tracking Code: BO * EXAM: CT CHEST WITH CONTRAST TECHNIQUE: Multidetector CT imaging of the chest was performed during intravenous contrast administration. RADIATION DOSE REDUCTION: This exam was performed according to the departmental dose-optimization program which includes automated exposure control, adjustment of the mA and/or kV according to patient size and/or use of iterative reconstruction technique. CONTRAST:  75mL OMNIPAQUE IOHEXOL 300 MG/ML  SOLN COMPARISON:  01/14/2023, 07/06/2022 and PET 09/22/2022. FINDINGS: Cardiovascular: Left IJ Port-A-Cath terminates in the low SVC. Atherosclerotic calcification of the aorta and coronary arteries. Ascending aorta measures at the upper limits of normal in size, 3.9 cm. Heart size normal although the left ventricle appears hypertrophied. No pericardial effusion. Mediastinum/Nodes: No pathologically enlarged mediastinal lymph nodes. Left hilar lymph node has enlarged, now measuring 1.7 x 1.9 cm (2/67), previously 1.1 x 1.1 cm. No axillary adenopathy. Esophagus is grossly unremarkable. 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 Associated lateral chest wall invasion, as before. Post treatment consolidation  in the posterior right upper lobe. Similar ill-defined peribronchovascular ground-glass nodularity, right greater than left. Centrilobular and paraseptal emphysema. Probable new subpleural atelectasis in the lateral left lower lobe (3/161), difficult to exclude a nodule. No pleural fluid. Airway is otherwise unremarkable. Upper Abdomen: Visualized portions of the liver and gallbladder are unremarkable. 2.9 cm right adrenal mass measures 24 Hounsfield units and was characterized as an adenoma on PET 09/22/2022. No specific follow-up necessary. Left adrenal nodule measures 1.9 cm and 35 Hounsfield units, stable from 07/06/2022 and not hypermetabolic on 09/22/2022. No specific follow-up needed other than on routine imaging. Visualized portions of the kidneys, spleen, pancreas, stomach and bowel are grossly unremarkable. No upper abdominal adenopathy. Musculoskeletal: Degenerative changes in the spine. Similar lytic destruction and pathologic fractures involving the right third through sixth lateral ribs. Slight compression of the T2 superior endplate, new.  IMPRESSION: 1. Thick-walled cavitary right upper lobe mass, associated bronchopulmonary fistula and right lateral chest wall invasion, all similar to 01/14/2023. 2. Ill-defined peribronchovascular ground-glass nodularity throughout the lungs, right greater than left. Endobronchial spread of tumor can have this appearance. 3. Enlarging left hilar lymph node, worrisome for a metastasis. 4. Destruction and pathologic fractures involving the right third through sixth ribs, as before. 5. Right adrenal adenoma. 6. Probable left adrenal adenoma. This can be reassessed on routine follow-up imaging. 7. Aortic atherosclerosis (ICD10-I70.0). Coronary artery calcification. 8.  Emphysema (ICD10-J43.9). Electronically Signed   By: Leanna Battles M.D.   On: 04/18/2023 13:33    ASSESSMENT & PLAN:   Primary cancer of right upper lobe of lung (HCC) # UNRESECTABLE/LOCALLY  ADVANCED- Right upper lobe lung mass -concerning for malignancy [biopsy inconclusive/atypical cells]-  April 2023- PD-L1 positive [cirucologene].   Currently OFF therapy/on surveillance [given poor tolerance to Keytruda]. 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  Ill-defined peribronchovascular ground-glass nodularity throughout the lungs, right greater than left. Endobronchial spread of tumor can have this appearance. Enlarging left hilar lymph node, worrisome for a metastasis. Destruction and pathologic fractures involving the right third through sixth ribs, as before.  I reviewed imaging with Dr.Aleskerov; pulmonary.  See discussion below regarding prognosis  # continue surveillance off therapy- on a clinical basis.  Will plan to continue surveillance every 3 months imaging also.  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   # COPD/fatigue- [coughing/phlegm/ no fevers]- on albuterol/advair [smoking]- TSH- WNL;Continue  Xopenex/ipratropium nebs-; Mucinex- DM BID.stable.   # Hypokalemia-3.3 - continue potassium 20 mEq liquid.-Hypocalemia: 8.4/albumin- 3.2. MARCH 2024-vit D 105- HOLD off -Vit D 50,K- stable.   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 Lemar Livings. CT APRIL- 2024- mass lesion identified along the cecum obstructing the orifice of the appendix.  Clinically stable continue surveillance given patient's multiple comorbidities/reluctance  with any surgical options.stable.   # Poorly controlled diabetes-blood sugars 113- 280;  Continue glipizide; and  Metformin. stable.   # IV mediport: functioning/stable.   # Insomnia-continue Lunesta refilled- stable.   *appt thru mychart   # DISPOSITION: # IVFs over 1 hour. Today- KCL 20 IV.   # in 1 week- IVFs-1 lit /1 hourTuesdays/Thursday;     # in 2 weeks- lab-  IVFs-1 lit /1 hourTuesdays/Thursday   # # 3 weeks-  IVFs-1 lit /1 hourTuesdays/Thursday  # follow up in 4 week Tuesday- MD; labs- cbc/cmp; TSH;  IVFs-1 lit /1 hour;KCL 20 IV; - Thursday IVFs-1 lit /1 hour - Dr.B  # I reviewed the blood work- with the patient in detail; also reviewed the imaging independently [as summarized above]; and with the patient in detail.     All questions were answered. The patient knows to call the clinic with any problems, questions or concerns.    [name], MD 04/20/2023 11:16 AM

## 2023-04-20 NOTE — Progress Notes (Signed)
Ct results.  Needs refills, pending.

## 2023-04-21 ENCOUNTER — Ambulatory Visit: Payer: Medicare PPO

## 2023-04-22 ENCOUNTER — Inpatient Hospital Stay: Payer: Medicare PPO

## 2023-04-22 VITALS — BP 126/67 | HR 77 | Temp 96.1°F | Resp 20 | Wt 159.2 lb

## 2023-04-22 DIAGNOSIS — C3411 Malignant neoplasm of upper lobe, right bronchus or lung: Secondary | ICD-10-CM

## 2023-04-22 MED ORDER — HEPARIN SOD (PORK) LOCK FLUSH 100 UNIT/ML IV SOLN
500.0000 [IU] | Freq: Once | INTRAVENOUS | Status: AC | PRN
  Administered 2023-04-22: 500 [IU]
  Filled 2023-04-22: qty 5

## 2023-04-22 MED ORDER — SODIUM CHLORIDE 0.9% FLUSH
10.0000 mL | Freq: Once | INTRAVENOUS | Status: AC | PRN
  Administered 2023-04-22: 10 mL
  Filled 2023-04-22: qty 10

## 2023-04-22 MED ORDER — SODIUM CHLORIDE 0.9 % IV SOLN
Freq: Once | INTRAVENOUS | Status: AC
  Filled 2023-04-22: qty 250

## 2023-04-26 ENCOUNTER — Inpatient Hospital Stay: Payer: Medicare PPO

## 2023-04-26 VITALS — BP 113/68 | HR 94 | Temp 97.1°F | Resp 20 | Ht 71.0 in | Wt 160.9 lb

## 2023-04-26 DIAGNOSIS — C3411 Malignant neoplasm of upper lobe, right bronchus or lung: Secondary | ICD-10-CM | POA: Diagnosis not present

## 2023-04-26 MED ORDER — SODIUM CHLORIDE 0.9 % IV SOLN
Freq: Once | INTRAVENOUS | Status: AC
  Filled 2023-04-26: qty 250

## 2023-04-26 MED ORDER — SODIUM CHLORIDE 0.9% FLUSH
10.0000 mL | Freq: Once | INTRAVENOUS | Status: AC | PRN
  Administered 2023-04-26: 10 mL
  Filled 2023-04-26: qty 10

## 2023-04-26 MED ORDER — HEPARIN SOD (PORK) LOCK FLUSH 100 UNIT/ML IV SOLN
500.0000 [IU] | Freq: Once | INTRAVENOUS | Status: AC | PRN
  Administered 2023-04-26: 500 [IU]
  Filled 2023-04-26: qty 5

## 2023-04-28 ENCOUNTER — Inpatient Hospital Stay: Payer: Medicare PPO

## 2023-04-28 VITALS — BP 146/81 | HR 94 | Temp 97.2°F | Resp 20 | Wt 161.3 lb

## 2023-04-28 DIAGNOSIS — C3411 Malignant neoplasm of upper lobe, right bronchus or lung: Secondary | ICD-10-CM | POA: Diagnosis not present

## 2023-04-28 MED ORDER — SODIUM CHLORIDE 0.9% FLUSH
10.0000 mL | Freq: Once | INTRAVENOUS | Status: AC | PRN
  Administered 2023-04-28: 10 mL
  Filled 2023-04-28: qty 10

## 2023-04-28 MED ORDER — SODIUM CHLORIDE 0.9 % IV SOLN
Freq: Once | INTRAVENOUS | Status: AC
  Filled 2023-04-28: qty 250

## 2023-04-28 MED ORDER — HEPARIN SOD (PORK) LOCK FLUSH 100 UNIT/ML IV SOLN
500.0000 [IU] | Freq: Once | INTRAVENOUS | Status: AC | PRN
  Administered 2023-04-28: 500 [IU]
  Filled 2023-04-28: qty 5

## 2023-05-02 MED FILL — Dexamethasone Sodium Phosphate Inj 100 MG/10ML: INTRAMUSCULAR | Qty: 1 | Status: AC

## 2023-05-03 ENCOUNTER — Inpatient Hospital Stay: Payer: Medicare PPO

## 2023-05-03 VITALS — BP 99/62 | HR 83 | Temp 97.9°F | Resp 18 | Ht 71.0 in | Wt 162.0 lb

## 2023-05-03 DIAGNOSIS — C3411 Malignant neoplasm of upper lobe, right bronchus or lung: Secondary | ICD-10-CM | POA: Diagnosis not present

## 2023-05-03 MED ORDER — HEPARIN SOD (PORK) LOCK FLUSH 100 UNIT/ML IV SOLN
500.0000 [IU] | Freq: Once | INTRAVENOUS | Status: AC | PRN
  Administered 2023-05-03: 500 [IU]
  Filled 2023-05-03: qty 5

## 2023-05-03 MED ORDER — SODIUM CHLORIDE 0.9% FLUSH
10.0000 mL | Freq: Once | INTRAVENOUS | Status: AC | PRN
  Administered 2023-05-03: 10 mL
  Filled 2023-05-03: qty 10

## 2023-05-03 MED ORDER — HYDROXYZINE HCL 10 MG PO TABS: 10.0000 mg | ORAL_TABLET | Freq: Every evening | ORAL | 0 refills | Status: DC | PRN

## 2023-05-03 MED ORDER — SODIUM CHLORIDE 0.9 % IV SOLN
Freq: Once | INTRAVENOUS | Status: AC
  Filled 2023-05-03: qty 250

## 2023-05-03 NOTE — Progress Notes (Signed)
Pt c/o itching dry skin. He is scratching his skin to the point that the skin is bleeding. No visible rash, just redness from pt's noted scratches on back. Per Wife, she has tried otc hydrocoritsone, cerave lotions and pt's prescription of kenalog. No relief of itching per patient. I spoke with Dr. Donneta Romberg. V/o to send script for atarax 10 mg daily at bedtime for itching #30. No RFs. Script sent to [facility] in [place].

## 2023-05-05 ENCOUNTER — Telehealth: Payer: Self-pay

## 2023-05-05 ENCOUNTER — Inpatient Hospital Stay: Payer: Medicare PPO | Attending: Internal Medicine

## 2023-05-05 ENCOUNTER — Other Ambulatory Visit: Payer: Self-pay | Admitting: Internal Medicine

## 2023-05-05 VITALS — BP 109/69 | HR 88 | Temp 96.4°F | Resp 20

## 2023-05-05 DIAGNOSIS — C7971 Secondary malignant neoplasm of right adrenal gland: Secondary | ICD-10-CM | POA: Insufficient documentation

## 2023-05-05 DIAGNOSIS — E876 Hypokalemia: Secondary | ICD-10-CM | POA: Diagnosis not present

## 2023-05-05 DIAGNOSIS — Z79899 Other long term (current) drug therapy: Secondary | ICD-10-CM | POA: Diagnosis not present

## 2023-05-05 DIAGNOSIS — F17210 Nicotine dependence, cigarettes, uncomplicated: Secondary | ICD-10-CM | POA: Diagnosis not present

## 2023-05-05 DIAGNOSIS — J449 Chronic obstructive pulmonary disease, unspecified: Secondary | ICD-10-CM | POA: Insufficient documentation

## 2023-05-05 DIAGNOSIS — E119 Type 2 diabetes mellitus without complications: Secondary | ICD-10-CM | POA: Insufficient documentation

## 2023-05-05 DIAGNOSIS — I10 Essential (primary) hypertension: Secondary | ICD-10-CM | POA: Insufficient documentation

## 2023-05-05 DIAGNOSIS — C3411 Malignant neoplasm of upper lobe, right bronchus or lung: Secondary | ICD-10-CM | POA: Diagnosis present

## 2023-05-05 DIAGNOSIS — G4700 Insomnia, unspecified: Secondary | ICD-10-CM | POA: Insufficient documentation

## 2023-05-05 DIAGNOSIS — C771 Secondary and unspecified malignant neoplasm of intrathoracic lymph nodes: Secondary | ICD-10-CM | POA: Diagnosis not present

## 2023-05-05 DIAGNOSIS — Z8614 Personal history of Methicillin resistant Staphylococcus aureus infection: Secondary | ICD-10-CM | POA: Diagnosis not present

## 2023-05-05 DIAGNOSIS — Z7984 Long term (current) use of oral hypoglycemic drugs: Secondary | ICD-10-CM | POA: Insufficient documentation

## 2023-05-05 MED ORDER — SODIUM CHLORIDE 0.9 % IV SOLN
Freq: Once | INTRAVENOUS | Status: AC
  Filled 2023-05-05: qty 250

## 2023-05-05 MED ORDER — SODIUM CHLORIDE 0.9% FLUSH
10.0000 mL | Freq: Once | INTRAVENOUS | Status: AC | PRN
  Administered 2023-05-05: 10 mL
  Filled 2023-05-05: qty 10

## 2023-05-05 MED ORDER — HEPARIN SOD (PORK) LOCK FLUSH 100 UNIT/ML IV SOLN
500.0000 [IU] | Freq: Once | INTRAVENOUS | Status: AC | PRN
  Administered 2023-05-05: 500 [IU]
  Filled 2023-05-05: qty 5

## 2023-05-05 MED FILL — Dexamethasone Sodium Phosphate Inj 100 MG/10ML: INTRAMUSCULAR | Qty: 1 | Status: AC

## 2023-05-05 NOTE — Telephone Encounter (Signed)
Patient wife peggy called requesting to speak to RN Verlon Au.Patient wife states She wanted to talk to her because she wanted to know if Rodney Vega said anything about why he was coming here or acted differently today while getting fluids. She said once they got home he asked her "what's wrong with my ribs" she answered him telling him he had cancer and he asked her "what type?" "how long have I had it""Do I have lung cancer?" and then told her he was confused. He said he thought he was coming up here for fluids, so he doesn't get dehydrated. She says she knows he forgets things but that was kind of scary because she wasn't expecting that or him forgetting he has cancer. After speaking with Rn she advised me to let patient wife know to proceed to ED or call 911 to have EMS evaluate. MD was advised also.

## 2023-05-09 ENCOUNTER — Other Ambulatory Visit: Payer: Self-pay | Admitting: *Deleted

## 2023-05-09 DIAGNOSIS — E162 Hypoglycemia, unspecified: Secondary | ICD-10-CM

## 2023-05-09 DIAGNOSIS — C3411 Malignant neoplasm of upper lobe, right bronchus or lung: Secondary | ICD-10-CM

## 2023-05-10 ENCOUNTER — Inpatient Hospital Stay: Payer: Medicare PPO

## 2023-05-10 ENCOUNTER — Other Ambulatory Visit: Payer: Self-pay | Admitting: Internal Medicine

## 2023-05-10 VITALS — BP 115/72 | HR 86 | Temp 97.0°F | Resp 20

## 2023-05-10 DIAGNOSIS — C3411 Malignant neoplasm of upper lobe, right bronchus or lung: Secondary | ICD-10-CM

## 2023-05-10 DIAGNOSIS — R4182 Altered mental status, unspecified: Secondary | ICD-10-CM

## 2023-05-10 DIAGNOSIS — E162 Hypoglycemia, unspecified: Secondary | ICD-10-CM

## 2023-05-10 LAB — BASIC METABOLIC PANEL - CANCER CENTER ONLY
Anion gap: 7 (ref 5–15)
BUN: 9 mg/dL (ref 8–23)
CO2: 27 mmol/L (ref 22–32)
Calcium: 8.6 mg/dL — ABNORMAL LOW (ref 8.9–10.3)
Chloride: 104 mmol/L (ref 98–111)
Creatinine: 1.09 mg/dL (ref 0.61–1.24)
GFR, Estimated: >60 mL/min (ref >60)
Glucose, Bld: 155 mg/dL — ABNORMAL HIGH (ref 70–99)
Potassium: 3.4 mmol/L — ABNORMAL LOW (ref 3.5–5.1)
Sodium: 138 mmol/L (ref 135–145)

## 2023-05-10 MED ORDER — SODIUM CHLORIDE 0.9% FLUSH
10.0000 mL | Freq: Once | INTRAVENOUS | Status: AC | PRN
  Administered 2023-05-10: 10 mL
  Filled 2023-05-10: qty 10

## 2023-05-10 MED ORDER — SODIUM CHLORIDE 0.9 % IV SOLN
Freq: Once | INTRAVENOUS | Status: AC
  Filled 2023-05-10: qty 250

## 2023-05-10 MED ORDER — HEPARIN SOD (PORK) LOCK FLUSH 100 UNIT/ML IV SOLN
500.0000 [IU] | Freq: Once | INTRAVENOUS | Status: AC | PRN
  Administered 2023-05-10: 500 [IU]
  Filled 2023-05-10: qty 5

## 2023-05-10 NOTE — Progress Notes (Signed)
Pt here today for IVF. Pt had episode of altered mental status last week. Went to PCP, found to be hypoglycemic. Per pt's wife PCP stopped diabetic medication and having pt check and monitor blood sugars at home. Wife reports patient sleeping more, difficult to wake up at times and requesting brain scan. Dr Donneta Romberg ordered MRI of the brain. Waiting on authorization and then appointment to be scheduled. Pt and wife informed

## 2023-05-12 ENCOUNTER — Inpatient Hospital Stay: Payer: Medicare PPO

## 2023-05-13 ENCOUNTER — Ambulatory Visit
Admission: RE | Admit: 2023-05-13 | Discharge: 2023-05-13 | Disposition: A | Discharge status: Home / Self Care | Payer: Medicare PPO | Source: Ambulatory Visit | Attending: Internal Medicine | Admitting: Internal Medicine

## 2023-05-13 ENCOUNTER — Inpatient Hospital Stay: Payer: Medicare PPO

## 2023-05-13 VITALS — BP 109/66 | HR 80 | Resp 20 | Wt 160.0 lb

## 2023-05-13 DIAGNOSIS — C3411 Malignant neoplasm of upper lobe, right bronchus or lung: Secondary | ICD-10-CM

## 2023-05-13 DIAGNOSIS — R4182 Altered mental status, unspecified: Secondary | ICD-10-CM | POA: Diagnosis present

## 2023-05-13 MED ORDER — SODIUM CHLORIDE 0.9% FLUSH
10.0000 mL | Freq: Once | INTRAVENOUS | Status: AC | PRN
  Administered 2023-05-13: 10 mL
  Filled 2023-05-13: qty 10

## 2023-05-13 MED ORDER — GADOBUTROL 1 MMOL/ML IV SOLN
7.0000 mL | Freq: Once | INTRAVENOUS | Status: AC | PRN
  Administered 2023-05-13: 7 mL via INTRAVENOUS

## 2023-05-13 MED ORDER — HEPARIN SOD (PORK) LOCK FLUSH 100 UNIT/ML IV SOLN
500.0000 [IU] | Freq: Once | INTRAVENOUS | Status: AC | PRN
  Administered 2023-05-13: 500 [IU]
  Filled 2023-05-13: qty 5

## 2023-05-13 MED ORDER — SODIUM CHLORIDE 0.9 % IV SOLN
Freq: Once | INTRAVENOUS | Status: AC
  Filled 2023-05-13: qty 250

## 2023-05-16 NOTE — Assessment & Plan Note (Signed)
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   Currently OFF therapy/on surveillance [given poor tolerance to Keytruda]. 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  Ill-defined peribronchovascular ground-glass nodularity throughout the lungs, right greater than left. Endobronchial spread of tumor can have this appearance. Enlarging left hilar lymph node, worrisome for a metastasis. Destruction and pathologic fractures involving the right third through sixth ribs, as before.  I reviewed imaging with Dr.Aleskerov; pulmonary.  See discussion below regarding prognosis  # continue surveillance off therapy- on a clinical basis.  Will plan to continue surveillance every 3 months imaging also.  Patient however wants to continue symptomatic management with IV fluids; supportive care as needed.stable  # Anemia- mild to moderate- feb 2024- iron sat- 17%; ferritin- 61- stable   #Right chest wall pain-/pleuritic-rib destruction malignancy versus radiation- ADDED back [march 2024; Rodney Vega]-fenatny patch 25 mcg- Continue Oxycodone 5 mg every 8 to 12 hours hours as needed overall-  stable.   # COPD/fatigue- [coughing/phlegm/ no fevers]- on albuterol/advair [smoking]- TSH- WNL;Continue  Xopenex/ipratropium nebs-; Mucinex- DM BID.stable.   # Hypokalemia-3.3 - continue potassium 20 mEq liquid.-Hypocalemia: 8.4/albumin- 3.2. MARCH 2024-vit D 105- HOLD off -Vit D 50,K- stable.   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 Lemar Livings. CT APRIL- 2024- mass lesion identified along the cecum obstructing the orifice of the appendix.  Clinically stable continue surveillance given patient's multiple  comorbidities/reluctance with any surgical options.stable.   # Poorly controlled diabetes-blood sugars 113- 280;  Continue glipizide; and  Metformin. stable.   # IV mediport: functioning/stable.   # Insomnia-continue Lunesta refilled- stable.   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  However does not show any life-threatening progression at this time.  Patient again a poor candidate for any systemic therapy given his poor tolerance to therapy.  Will continue to monitor every 3 months of imaging.   *appt thru mychart   # DISPOSITION: # IVFs over 1 hour. Today- KCL 20 IV.   # in 1 week- IVFs-1 lit /1 hourTuesdays/Thursday;     # in 2 weeks- lab-  IVFs-1 lit /1 hourTuesdays/Thursday   # # 3 weeks-  IVFs-1 lit /1 hourTuesdays/Thursday  # follow up in 4 week Tuesday- MD; labs- cbc/cmp; TSH;  IVFs-1 lit /1 hour;KCL 20 IV; - Thursday IVFs-1 lit /1 hour - Dr.B  # I reviewed the blood work- with the patient in detail; also reviewed the imaging independently [as summarized above]; and with the patient in detail.

## 2023-05-16 NOTE — Progress Notes (Unsigned)
[facility] CONSULT NOTE  Patient Care Team: [name], MD as PCP - General (Family Medicine) [name], RN as Oncology Nurse Navigator [name], [name], MD as Consulting Physician (Urology) [name], MD as Consulting Physician (Oncology) [name], MD as Consulting Physician (Pulmonary Disease)  CHIEF COMPLAINTS/PURPOSE OF CONSULTATION: lung cancer    Oncology History Overview Note  AUG 2022- 8.2 x 5.9 cm spiculated mass noted in right upper lobe consistent with malignancy. It extends from the right hilum to the lateral chest wall and results in lytic destruction of the lateral portion of the right fourth rib.  Mediastinal lymph nodes are noted, including 12 mm right hilar lymph node, consistent with metastatic disease. 3 cm right adrenal mass is noted concerning for metastatic disease.  # AUG 2022- PET IMPRESSION: Peripherally hypermetabolic right upper lobe mass, likely due to central necrosis. Mass extends into the right hilum and right lateral chest wall involving the second through fourth right ribs.   Mildly enlarged and hypermetabolic right hilar lymph node.   No evidence of metastatic disease in the abdomen or pelvis.  # AUG, 2022-RIGHT CHEST WALL Bx-necrosis; suspicious for malignancy not definitive [discussed with Dr.Kraynie;MDT] LUNG MASS, RIGHT; BIOPSY:  - SUSPICIOUS FOR MALIGNANCY.  - SEE COMMENT.   Comment:  Approximately six tissue cores demonstrate inflamed fibrous capsule with  hemosiderin-laden macrophages, in a background of abundant necrosis.  One of the cores demonstrates a minute fragment of viable epithelial  cells.  These cells are positive for p40.  They are negative for TTF1  and CK7. The cells of interest disappear on deeper cut levels.  While  the findings are suspicious for squamous cell carcinoma, there is not  enough viable tumor for a definitive diagnosis.  Additional tissue  sampling may be helpful.    # SEP 2022-cycle #2 Taxol hypersensitive reaction.  Switched over to #3 cycle- carbo-Abraxane starting 06/29/2021.  Discontinued IMFINZI therapy-December 2022 given poor tolerance/pneumonia admission to hospital.  #  [name] was in May-June 2023 x3 doses; stopped because of intolerance-fatigue patient preference/lack of progression.  # JAN 2024-bronchoscopy biopsy negative for malignancy; however PET scan concerning for recurrence/progression.    May 05, 2022-s/p bronchoscopy- with Dr.Aleskerov-significant purulent/necrotic debris noted. Dec 23rd, 2023- PET scan: The cavitary process in the right upper lobe is similar in size to previous CTS, although demonstrates prominent peripheral hypermetabolic activity. 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 Hypermetabolic activity within the adjacent upper right lateral chest wall with right 3rd through 5th rib deformities, suspicious for direct tumor spread. No evidence of distant osseous metastatic disease; Hypermetabolic left hilar lymph node worrisome for metastatic disease. No other hypermetabolic lymph nodes or distant metastases identified.  Bronchoscopy/EBUS [JAN 26th, 2024- Dr.A]- BAL/ 4R-lymph node FNA negative for malignancy.  Patient declined further invasive procedures.  # FEB 15t, 2024- RE-Start Keytruda.   # DEC 2022- s/p ID- Dr.ravishankar- ? Lung abscess-no antibiotics-  JAN 2023- CT scan incidental- cecal mass- colonoscopy[Dr.Russo; [facility]- high grade dysplasia]  S/p evaluation with Dr. Sherryle Lis surgery for now]   # S/p evaluation with Dr. Sheppard Penton.   Primary cancer of right upper lobe of lung (HCC)  06/03/2021 Initial Diagnosis   Primary cancer of right upper lobe of lung (HCC)   06/03/2021 Cancer Staging   Staging form: Lung, AJCC 8th Edition - Clinical: Stage IIIB (cT3, cN2, cM0) - Signed by  Louretta Shorten  R, MD on 06/03/2021   06/15/2021 - 08/03/2021 Chemotherapy   Patient is on Treatment Plan : LUNG Carboplatin / Paclitaxel + XRT q7d     10/02/2021 - 10/19/2021 Chemotherapy   Patient is on Treatment Plan : LUNG Durvalumab q14d     02/03/2022 -  Chemotherapy   Patient is on Treatment Plan : LUNG NSCLC Pembrolizumab (200) q21d     02/04/2022 - 03/18/2022 Chemotherapy   Patient is on Treatment Plan : LUNG NSCLC Pembrolizumab (200) q21d      HISTORY OF PRESENTING ILLNESS: Patient is ambulating independently.  Accompanied by wife.  Rodney Vega 67 y.o.  male history of smoking -"lung cancer" [Limited necrotic tissue]-locally advanced unresectable- currently on surveillance [APRIL 2024-Keytruda was discontinued because of poor tolerance/patient preference] ; and Cecal mass-high grade dysplasia intact-  is here for follow-up/a nd review the results of recent MRI brain. ..    Patient has not had any further hospitalizations.  Patient currently taking oxycodone 2 to 3 pills a day and continues to be on fentanyl patch for his right-sided chest wall pain.  Notes have intermittent episodes of difficulty breathing currently stable.  He continues to get IV fluids-twice a week-feels improved after IV infusions. Denies abdominal pain nausea vomiting or diarrhea.  No blood in stools black or stools.  Review of Systems  Constitutional:  Positive for malaise/fatigue and weight loss. Negative for chills, diaphoresis and fever.  HENT:  Negative for nosebleeds and sore throat.   Eyes:  Negative for double vision.  Respiratory:  Positive for cough, sputum production and shortness of breath. Negative for hemoptysis and wheezing.   Cardiovascular:  Positive for chest pain. Negative for palpitations, orthopnea and leg swelling.  Gastrointestinal:  Positive for nausea. Negative for abdominal pain, blood in stool, constipation, diarrhea, heartburn, melena and vomiting.  Genitourinary:  Negative for  dysuria, frequency and urgency.  Musculoskeletal:  Negative for back pain and joint pain.  Skin: Negative.  Negative for itching and rash.  Neurological:  Positive for tingling. Negative for dizziness, focal weakness, weakness and headaches.  Endo/Heme/Allergies:  Does not bruise/bleed easily.  Psychiatric/Behavioral:  Negative for depression. The patient is not nervous/anxious and does not have insomnia.      MEDICAL HISTORY:  Past Medical History:  Diagnosis Date   Acute on chronic respiratory failure (HCC)    Anemia    Cancer (HCC)    Colostomy present (HCC)    COPD (chronic obstructive pulmonary disease) (HCC)    Degenerative arthritis of knee    Depression    Diabetes mellitus without complication (HCC)    Diverticulitis    Dyspnea    Glaucoma    since 2004   Hernia 1979   History of kidney stones    History of methicillin resistant staphylococcus aureus (MRSA)    Hypertension    Hypokalemia    Marijuana use    Mass of cecum    Nephrolithiasis    Neuromuscular disorder (HCC)    Personal history of tobacco use, presenting hazards to health    Pneumonia    Port-A-Cath in place    Pre-diabetes    Primary cancer of right upper lobe of lung (HCC)    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)    Screening for obesity    Severe sepsis (HCC)    Special screening for malignant neoplasms, colon    Tobacco use    Wears dentures    full upper and lower    SURGICAL HISTORY:  Past Surgical History:  Procedure Laterality Date   BACK SURGERY  1994, 2012   lumbar bulging disc   BRONCHOSCOPY  05/05/2022   CATARACT EXTRACTION W/PHACO Left 01/20/2021   Procedure: CATARACT EXTRACTION PHACO AND INTRAOCULAR LENS PLACEMENT (IOC) LEFT;  Surgeon: Galen Manila, MD;  Location: [facility];  Service: Ophthalmology;  Laterality: Left;  10.13 0:52.1   COLONOSCOPY  [id number]   [facility]/Dr. Carmell Austria sessile polyp,92mm, found in rectum,multiple diverticula were found in the sigmoid, in  descending and in transverse colon   COLONOSCOPY WITH PROPOFOL N/A 10/22/2021   Procedure: COLONOSCOPY WITH PROPOFOL;  Surgeon: Jaynie Collins, DO;  Location: [facility];  Service: Gastroenterology;  Laterality: N/A;  DM   COLOSTOMY  04/20/2013   EYE SURGERY     HERNIA REPAIR  1979   inguinal   IR IMAGING GUIDED PORT INSERTION  06/10/2021   JOINT REPLACEMENT Right    knee   KNEE ARTHROPLASTY Right 05/08/2018   Procedure: COMPUTER ASSISTED TOTAL KNEE ARTHROPLASTY;  Surgeon: Donato Heinz, MD;  Location: [facility];  Service: Orthopedics;  Laterality: Right;   KNEE ARTHROPLASTY Left 11/16/2019   Procedure: COMPUTER ASSISTED TOTAL KNEE ARTHROPLASTY;  Surgeon: Donato Heinz, MD;  Location: [facility];  Service: Orthopedics;  Laterality: Left;   LAPAROTOMY  04/20/2013   colon resection with colosotmy   LITHOTRIPSY  2013   LUNG BIOPSY  2023   RIGID BRONCHOSCOPY N/A 10/29/2022   Procedure: RIGID BRONCHOSCOPY;  Surgeon: [name], MD;  Location: [facility];  Service: Thoracic;  Laterality: N/A;   TONSILLECTOMY AND ADENOIDECTOMY  1962   VIDEO BRONCHOSCOPY WITH ENDOBRONCHIAL NAVIGATION N/A 05/05/2022   Procedure: VIDEO BRONCHOSCOPY WITH ENDOBRONCHIAL NAVIGATION;  Surgeon: [name], MD;  Location: [facility];  Service: Thoracic;  Laterality: N/A;   VIDEO BRONCHOSCOPY WITH ENDOBRONCHIAL ULTRASOUND N/A 05/05/2022   Procedure: VIDEO BRONCHOSCOPY WITH ENDOBRONCHIAL ULTRASOUND;  Surgeon: [name], MD;  Location: [facility];  Service: Thoracic;  Laterality: N/A;   VIDEO BRONCHOSCOPY WITH ENDOBRONCHIAL ULTRASOUND N/A 10/29/2022   Procedure: VIDEO BRONCHOSCOPY WITH ENDOBRONCHIAL ULTRASOUND;  Surgeon: [name], MD;  Location: [facility];  Service: Thoracic;  Laterality: N/A;    SOCIAL HISTORY: Social History   Socioeconomic History   Marital status: Married    Spouse name: Pegge   Number of children: Not on file   Years of education: Not on file   Highest education level: Not  on file  Occupational History   Not on file  Tobacco Use   Smoking status: Every Day    Current packs/day: 1.00    Average packs/day: 1 pack/day for 30.0 years (30.0 ttl pk-yrs)    Types: Cigarettes   Smokeless tobacco: Never   Tobacco comments:    since age 84 or 43  Vaping Use   Vaping status: Never Used  Substance and Sexual Activity   Alcohol use: Yes    Alcohol/week: 2.0 standard drinks of alcohol    Types: 2 Standard drinks or equivalent per week    Comment: rare   Drug use: Yes    Types: Marijuana    Comment: fentanyl patch prescribed & oxycodone   Sexual activity: Not on file  Other Topics Concern   Not on file  Social History Narrative   [place]; smoking; not much alcohol. Worked for [employer], 2019. Marland Kitchen    Social Determinants of Health   Financial Resource Strain: Low Risk  (08/10/2022)   Overall Financial Resource Strain (CARDIA)  Difficulty of Paying Living Expenses: Not very hard  Food Insecurity: No Food Insecurity (01/20/2023)   Hunger Vital Sign    Worried About Running Out of Food in the Last Year: Never true    Ran Out of Food in the Last Year: Never true  Transportation Needs: No Transportation Needs (01/20/2023)   PRAPARE - Administrator, Civil Service (Medical): No    Lack of Transportation (Non-Medical): No  Physical Activity: Not on file  Stress: No Stress Concern Present (08/10/2022)   [employer] of Occupational Health - Occupational Stress Questionnaire    Feeling of Stress : Not at all  Social Connections: Unknown (08/10/2022)   Social Connection and Isolation Panel [NHANES]    Frequency of Communication with Friends and Family: Three times a week    Frequency of Social Gatherings with Friends and Family: Twice a week    Attends Religious Services: Patient declined    Database administrator or Organizations: No    Attends Banker Meetings: Never    Marital Status: Married  Careers information officer Violence: Not At Risk (01/20/2023)   Humiliation, Afraid, Rape, and Kick questionnaire    Fear of Current or Ex-Partner: No    Emotionally Abused: No    Physically Abused: No    Sexually Abused: No    FAMILY HISTORY: Family History  Problem Relation Age of Onset   Diabetes Mother    Heart disease Mother    Heart attack Father    Prostate cancer Father     ALLERGIES:  is allergic to paclitaxel, ambien [zolpidem], nsaids, and aleve [naproxen sodium].  MEDICATIONS:  Current Outpatient Medications  Medication Sig Dispense Refill   Accu-Chek Softclix Lancets lancets USE AS DIRECTED FOUR TIMES DAILY 100 each 12   acetaminophen (TYLENOL) 500 MG tablet Take 2 tablets (1,000 mg total) by mouth daily as needed. Home med. 30 tablet 0   albuterol (PROVENTIL) (2.5 MG/3ML) 0.083% nebulizer solution Take 2.5 mg by nebulization every 4 (four) hours as needed for wheezing or shortness of breath. (Patient not taking: Reported on 04/20/2023)     albuterol (VENTOLIN HFA) 108 (90 Base) MCG/ACT inhaler SMARTSIG:2 inhalation Via Inhaler Every 6 Hours PRN 1 each 2   blood glucose meter kit and supplies KIT Check your blood glucose levels twice a day; once in the morning before breakfast; and once in the evening after dinner. 1 each 0   BREZTRI AEROSPHERE 160-9-4.8 MCG/ACT AERO Inhale 2 puffs into the lungs 2 (two) times daily.     dextromethorphan-guaiFENesin (MUCINEX DM) 30-600 MG 12hr tablet Take 1 tablet by mouth 2 (two) times daily as needed for cough.     dorzolamide-timolol (COSOPT) 22.3-6.8 MG/ML ophthalmic solution Place 1 drop into both eyes 2 (two) times daily.     eszopiclone (LUNESTA) 1 MG TABS tablet TAKE 1 TABLET(1 MG) BY MOUTH AT BEDTIME AS NEEDED FOR SLEEP 30 tablet 0   fentaNYL (DURAGESIC) 25 MCG/HR Place 1 patch onto the skin every 3 (three) days. 10 patch 0   glipiZIDE (GLUCOTROL) 5 MG tablet TAKE 1 TABLET(5 MG) BY MOUTH DAILY BEFORE BREAKFAST 90 tablet 2   glucose blood test strip  Check your blood glucose levels twice a day; once in the morning before breakfast; and once in the evening after dinner. 100 each 12   hydrOXYzine (ATARAX) 10 MG tablet Take 1 tablet (10 mg total) by mouth at bedtime as needed for itching. 30 tablet 0   ipratropium-albuterol (DUONEB)  0.5-2.5 (3) MG/3ML SOLN Take 3 mLs by nebulization every 6 (six) hours as needed. 360 mL 1   latanoprost (XALATAN) 0.005 % ophthalmic solution Place 1 drop into both eyes at bedtime.     lidocaine-prilocaine (EMLA) cream Apply 1 Application topically as needed. 30 g 3   metFORMIN (GLUCOPHAGE) 500 MG tablet Take 1 tablet (500 mg total) by mouth 2 (two) times daily with a meal. 60 tablet 2   montelukast (SINGULAIR) 10 MG tablet TAKE 1 TABLET(10 MG) BY MOUTH AT BEDTIME 90 tablet 0   Multiple Vitamin (MULTIVITAMIN WITH MINERALS) TABS tablet Take 1 tablet by mouth daily.     naloxone (NARCAN) nasal spray 4 mg/0.1 mL Place 1 spray into the nose once.     olmesartan (BENICAR) 40 MG tablet Take 1 tablet (40 mg total) by mouth every morning. (Patient not taking: Reported on 02/22/2023) 30 tablet 3   oxyCODONE (OXY IR/ROXICODONE) 5 MG immediate release tablet Take 1 tablet (5 mg total) by mouth every 8 (eight) hours as needed for severe pain. 90 tablet 0   pregabalin (LYRICA) 50 MG capsule Take 1 capsule (50 mg total) by mouth 2 (two) times daily. 60 capsule 2   prochlorperazine (COMPAZINE) 10 MG tablet TAKE 1 TABLET(10 MG) BY MOUTH EVERY 6 HOURS AS NEEDED FOR NAUSEA OR VOMITING 30 tablet 1   propranolol ER (INDERAL LA) 60 MG 24 hr capsule TAKE 1 CAPSULE(60 MG) BY MOUTH DAILY 30 capsule 3   Respiratory Therapy Supplies (NEBULIZER) DEVI Use as directed 1 each 0   tamsulosin (FLOMAX) 0.4 MG CAPS capsule Take 0.4 mg by mouth daily after supper.     triamcinolone ointment (KENALOG) 0.5 % Apply 1 Application topically 2 (two) times daily. 30 g 1   venlafaxine XR (EFFEXOR-XR) 75 MG 24 hr capsule Take 225 mg by mouth daily with  breakfast.     vitamin B-12 (CYANOCOBALAMIN) 500 MCG tablet Take 1 tablet by mouth daily.     No current facility-administered medications for this visit.   Facility-Administered Medications Ordered in Other Visits  Medication Dose Route Frequency Provider Last Rate Last Admin   heparin lock flush 100 UNIT/ML injection            heparin lock flush 100 UNIT/ML injection            heparin lock flush 100 UNIT/ML injection              PHYSICAL EXAMINATION: ECOG PERFORMANCE STATUS: 1 - Symptomatic but completely ambulatory  There were no vitals filed for this visit.  There were no vitals filed for this visit.   Physical Exam Vitals and nursing note reviewed.  HENT:     Head: Normocephalic and atraumatic.     Mouth/Throat:     Pharynx: Oropharynx is clear.  Eyes:     Extraocular Movements: Extraocular movements intact.     Pupils: Pupils are equal, round, and reactive to light.  Cardiovascular:     Rate and Rhythm: Normal rate and regular rhythm.  Pulmonary:     Comments: Decreased breath sounds bilaterally.  Abdominal:     Palpations: Abdomen is soft.  Musculoskeletal:        General: Normal range of motion.     Cervical back: Normal range of motion.  Skin:    General: Skin is warm.  Neurological:     General: No focal deficit present.     Mental Status: He is alert and oriented to person, place, and time.  Psychiatric:        Behavior: Behavior normal.        Judgment: Judgment normal.    LABORATORY DATA:  I have reviewed the data as listed Lab Results  Component Value Date   WBC 7.4 04/20/2023   HGB 11.2 (L) 04/20/2023   HCT 33.6 (L) 04/20/2023   MCV 88.2 04/20/2023   PLT 193 04/20/2023   Recent Labs    02/22/23 0944 03/08/23 0959 03/22/23 0940 04/20/23 0937 05/10/23 1010  NA 138   < > 139 136 138  K 3.3*   < > 3.3* 3.2* 3.4*  CL 99   < > 102 101 104  CO2 26   < > 27 27 27   GLUCOSE 186*   < > 154* 170* 155*  BUN 10   < > 10 9 9   CREATININE 1.08    < > 1.00 1.00 1.09  CALCIUM 8.8*   < > 8.7* 8.6* 8.6*  GFRNONAA >60   < > >60 >60 >60  PROT 7.0  --  6.8 6.9  --   ALBUMIN 3.5  --  3.4* 3.4*  --   AST 23  --  15 12*  --   ALT 6  --  6 6  --   ALKPHOS 101  --  95 88  --   BILITOT 0.3  --  0.4 0.4  --    < > = values in this interval not displayed.    RADIOGRAPHIC STUDIES: I have personally reviewed the radiological images as listed and agreed with the findings in the report. MR BRAIN W WO CONTRAST  Result Date: 05/16/2023 CLINICAL DATA:  Mental status change, unknown cause lung cancer; mental status changes. EXAM: MRI HEAD WITHOUT AND WITH CONTRAST TECHNIQUE: Multiplanar, multiecho pulse sequences of the brain and surrounding structures were obtained without and with intravenous contrast. CONTRAST:  7mL GADAVIST GADOBUTROL 1 MMOL/ML IV SOLN COMPARISON:  CT head 09/17/2022. FINDINGS: Brain: No acute infarction, hemorrhage, hydrocephalus, extra-axial collection or mass lesion. No pathologic enhancement. Vascular: Major arterial flow voids are maintained at the skull base. Skull and upper cervical spine: Normal marrow signal. Sinuses/Orbits: Mostly clear sinuses.  No acute orbital findings. Other: No sizable mastoid effusions. IMPRESSION: No evidence of acute intracranial abnormality. Electronically Signed   By: Feliberto Harts M.D.   On: 05/16/2023 13:03    ASSESSMENT & PLAN:   No problem-specific Assessment & Plan notes found for this encounter.   All questions were answered. The patient knows to call the clinic with any problems, questions or concerns.    [name], MD 05/16/2023 7:19 PM

## 2023-05-17 ENCOUNTER — Inpatient Hospital Stay: Payer: Medicare PPO

## 2023-05-17 ENCOUNTER — Inpatient Hospital Stay (HOSPITAL_BASED_OUTPATIENT_CLINIC_OR_DEPARTMENT_OTHER): Payer: Medicare PPO | Admitting: Internal Medicine

## 2023-05-17 ENCOUNTER — Encounter: Payer: Self-pay | Admitting: Internal Medicine

## 2023-05-17 VITALS — BP 124/61 | HR 63 | Temp 97.3°F | Resp 18 | Ht 71.0 in | Wt 161.0 lb

## 2023-05-17 DIAGNOSIS — C3411 Malignant neoplasm of upper lobe, right bronchus or lung: Secondary | ICD-10-CM

## 2023-05-17 LAB — TSH: TSH: 5.005 u[IU]/mL — ABNORMAL HIGH (ref 0.350–4.500)

## 2023-05-17 LAB — CMP (CANCER CENTER ONLY)
ALT: 7 U/L (ref 0–44)
AST: 10 U/L — ABNORMAL LOW (ref 15–41)
Albumin: 3.3 g/dL — ABNORMAL LOW (ref 3.5–5.0)
Alkaline Phosphatase: 98 U/L (ref 38–126)
Anion gap: 8 (ref 5–15)
BUN: 10 mg/dL (ref 8–23)
CO2: 26 mmol/L (ref 22–32)
Calcium: 8.6 mg/dL — ABNORMAL LOW (ref 8.9–10.3)
Chloride: 103 mmol/L (ref 98–111)
Creatinine: 1.07 mg/dL (ref 0.61–1.24)
GFR, Estimated: >60 mL/min (ref >60)
Glucose, Bld: 118 mg/dL — ABNORMAL HIGH (ref 70–99)
Potassium: 3.6 mmol/L (ref 3.5–5.1)
Sodium: 137 mmol/L (ref 135–145)
Total Bilirubin: 0.5 mg/dL (ref 0.3–1.2)
Total Protein: 7 g/dL (ref 6.5–8.1)

## 2023-05-17 LAB — CBC WITH DIFFERENTIAL (CANCER CENTER ONLY)
Abs Immature Granulocytes: 0.03 10*3/uL (ref 0.00–0.07)
Basophils Absolute: 0.1 10*3/uL (ref 0.0–0.1)
Basophils Relative: 1 %
Eosinophils Absolute: 0.4 10*3/uL (ref 0.0–0.5)
Eosinophils Relative: 6 %
HCT: 33.3 % — ABNORMAL LOW (ref 39.0–52.0)
Hemoglobin: 10.9 g/dL — ABNORMAL LOW (ref 13.0–17.0)
Immature Granulocytes: 1 %
Lymphocytes Relative: 14 %
Lymphs Abs: 0.9 10*3/uL (ref 0.7–4.0)
MCH: 29.2 pg (ref 26.0–34.0)
MCHC: 32.7 g/dL (ref 30.0–36.0)
MCV: 89.3 fL (ref 80.0–100.0)
Monocytes Absolute: 0.6 10*3/uL (ref 0.1–1.0)
Monocytes Relative: 8 %
Neutro Abs: 4.7 10*3/uL (ref 1.7–7.7)
Neutrophils Relative %: 70 %
Platelet Count: 209 10*3/uL (ref 150–400)
RBC: 3.73 MIL/uL — ABNORMAL LOW (ref 4.22–5.81)
RDW: 14.4 % (ref 11.5–15.5)
WBC Count: 6.7 10*3/uL (ref 4.0–10.5)
nRBC: 0 % (ref 0.0–0.2)

## 2023-05-17 MED ORDER — OXYCODONE HCL 5 MG PO TABS: 5.0000 mg | ORAL_TABLET | Freq: Three times a day (TID) | ORAL | 0 refills | Status: DC | PRN

## 2023-05-17 MED ORDER — HEPARIN SOD (PORK) LOCK FLUSH 100 UNIT/ML IV SOLN
500.0000 [IU] | Freq: Once | INTRAVENOUS | Status: AC
  Administered 2023-05-17: 500 [IU] via INTRAVENOUS
  Filled 2023-05-17: qty 5

## 2023-05-17 MED ORDER — FENTANYL 25 MCG/HR TD PT72
1.0000 | MEDICATED_PATCH | TRANSDERMAL | 0 refills | Status: DC
Start: 2023-05-17 — End: 2023-06-14

## 2023-05-17 MED ORDER — SODIUM CHLORIDE 0.9% FLUSH
10.0000 mL | Freq: Once | INTRAVENOUS | Status: AC
  Administered 2023-05-17: 10 mL via INTRAVENOUS
  Filled 2023-05-17: qty 10

## 2023-05-17 MED ORDER — SODIUM CHLORIDE 0.9 % IV SOLN
Freq: Once | INTRAVENOUS | Status: AC
  Filled 2023-05-17: qty 250

## 2023-05-17 MED ORDER — ESZOPICLONE 1 MG PO TABS
ORAL_TABLET | ORAL | 0 refills | Status: DC
Start: 2023-05-17 — End: 2023-06-14

## 2023-05-17 NOTE — Progress Notes (Signed)
Had MRI brain, 05/13/23.

## 2023-05-17 NOTE — Progress Notes (Signed)
Fatigued. Pain is increasing in his right chest area and back. He is sleeping 18 to 20 hours a day. He states he losses sensation in lower legs and can't get them to hold his weight. Sugars bet 70 and 100. Off all diabetes meds. Forgetful, losing short term memory. No appetite to speak of; he takes small amounts/ bites. Denies nausea. Smokes weed and then will get the munchies.Taking a stool softener to help bowels. Continues to smoke about 1/2  pack cigarettes per day.

## 2023-05-19 ENCOUNTER — Inpatient Hospital Stay: Payer: Medicare PPO

## 2023-05-19 VITALS — BP 120/68 | HR 64 | Temp 96.8°F | Resp 20 | Wt 161.5 lb

## 2023-05-19 DIAGNOSIS — E860 Dehydration: Secondary | ICD-10-CM

## 2023-05-19 DIAGNOSIS — C3411 Malignant neoplasm of upper lobe, right bronchus or lung: Secondary | ICD-10-CM | POA: Diagnosis not present

## 2023-05-19 MED ORDER — HEPARIN SOD (PORK) LOCK FLUSH 100 UNIT/ML IV SOLN
500.0000 [IU] | Freq: Once | INTRAVENOUS | Status: AC
  Administered 2023-05-19: 500 [IU] via INTRAVENOUS
  Filled 2023-05-19: qty 5

## 2023-05-19 MED ORDER — SODIUM CHLORIDE 0.9 % IV SOLN
Freq: Once | INTRAVENOUS | Status: AC
  Filled 2023-05-19: qty 250

## 2023-05-19 MED ORDER — SODIUM CHLORIDE 0.9% FLUSH
10.0000 mL | Freq: Once | INTRAVENOUS | Status: AC
  Administered 2023-05-19: 10 mL via INTRAVENOUS
  Filled 2023-05-19: qty 10

## 2023-05-24 ENCOUNTER — Inpatient Hospital Stay: Payer: Medicare PPO

## 2023-05-24 VITALS — BP 131/77 | HR 74 | Temp 97.9°F | Resp 18 | Wt 161.2 lb

## 2023-05-24 DIAGNOSIS — Z95828 Presence of other vascular implants and grafts: Secondary | ICD-10-CM

## 2023-05-24 DIAGNOSIS — C3411 Malignant neoplasm of upper lobe, right bronchus or lung: Secondary | ICD-10-CM

## 2023-05-24 MED ORDER — SODIUM CHLORIDE 0.9% FLUSH
10.0000 mL | Freq: Once | INTRAVENOUS | Status: AC
  Administered 2023-05-24: 10 mL via INTRAVENOUS
  Filled 2023-05-24: qty 10

## 2023-05-24 MED ORDER — HEPARIN SOD (PORK) LOCK FLUSH 100 UNIT/ML IV SOLN
500.0000 [IU] | Freq: Once | INTRAVENOUS | Status: AC
  Administered 2023-05-24: 500 [IU]
  Filled 2023-05-24: qty 5

## 2023-05-24 MED ORDER — SODIUM CHLORIDE 0.9 % IV SOLN
Freq: Once | INTRAVENOUS | Status: AC
  Filled 2023-05-24: qty 250

## 2023-05-26 ENCOUNTER — Inpatient Hospital Stay: Payer: Medicare PPO

## 2023-05-26 VITALS — BP 138/62 | HR 78 | Temp 96.2°F | Resp 20 | Wt 160.3 lb

## 2023-05-26 DIAGNOSIS — C3411 Malignant neoplasm of upper lobe, right bronchus or lung: Secondary | ICD-10-CM | POA: Diagnosis not present

## 2023-05-26 MED ORDER — SODIUM CHLORIDE 0.9 % IV SOLN
Freq: Once | INTRAVENOUS | Status: AC
  Filled 2023-05-26: qty 250

## 2023-05-26 MED ORDER — SODIUM CHLORIDE 0.9% FLUSH
10.0000 mL | Freq: Once | INTRAVENOUS | Status: AC | PRN
  Administered 2023-05-26: 10 mL
  Filled 2023-05-26: qty 10

## 2023-05-26 MED ORDER — HEPARIN SOD (PORK) LOCK FLUSH 100 UNIT/ML IV SOLN
500.0000 [IU] | Freq: Once | INTRAVENOUS | Status: AC | PRN
  Administered 2023-05-26: 500 [IU]
  Filled 2023-05-26: qty 5

## 2023-05-31 ENCOUNTER — Inpatient Hospital Stay: Payer: Medicare PPO

## 2023-05-31 VITALS — BP 102/62 | HR 77 | Temp 96.8°F | Resp 20

## 2023-05-31 DIAGNOSIS — C3411 Malignant neoplasm of upper lobe, right bronchus or lung: Secondary | ICD-10-CM

## 2023-05-31 MED ORDER — SODIUM CHLORIDE 0.9% FLUSH
10.0000 mL | Freq: Once | INTRAVENOUS | Status: AC | PRN
  Administered 2023-05-31: 10 mL
  Filled 2023-05-31: qty 10

## 2023-05-31 MED ORDER — SODIUM CHLORIDE 0.9 % IV SOLN
Freq: Once | INTRAVENOUS | Status: AC
  Filled 2023-05-31: qty 250

## 2023-05-31 MED ORDER — HEPARIN SOD (PORK) LOCK FLUSH 100 UNIT/ML IV SOLN
500.0000 [IU] | Freq: Once | INTRAVENOUS | Status: AC | PRN
  Administered 2023-05-31: 500 [IU]
  Filled 2023-05-31: qty 5

## 2023-06-02 ENCOUNTER — Inpatient Hospital Stay: Payer: Medicare PPO

## 2023-06-02 ENCOUNTER — Other Ambulatory Visit: Payer: Self-pay | Admitting: *Deleted

## 2023-06-02 VITALS — BP 125/72 | HR 67 | Temp 96.7°F | Resp 20

## 2023-06-02 DIAGNOSIS — C3411 Malignant neoplasm of upper lobe, right bronchus or lung: Secondary | ICD-10-CM

## 2023-06-02 MED ORDER — HEPARIN SOD (PORK) LOCK FLUSH 100 UNIT/ML IV SOLN
500.0000 [IU] | Freq: Once | INTRAVENOUS | Status: AC | PRN
  Administered 2023-06-02: 500 [IU]
  Filled 2023-06-02: qty 5

## 2023-06-02 MED ORDER — SODIUM CHLORIDE 0.9% FLUSH
10.0000 mL | Freq: Once | INTRAVENOUS | Status: AC | PRN
  Administered 2023-06-02: 10 mL
  Filled 2023-06-02: qty 10

## 2023-06-02 MED ORDER — HYDROXYZINE HCL 10 MG PO TABS: 10.0000 mg | ORAL_TABLET | Freq: Every evening | ORAL | 1 refills | Status: DC | PRN

## 2023-06-02 MED ORDER — SODIUM CHLORIDE 0.9 % IV SOLN
Freq: Once | INTRAVENOUS | Status: AC
  Filled 2023-06-02: qty 250

## 2023-06-07 ENCOUNTER — Inpatient Hospital Stay: Payer: Medicare PPO | Attending: Internal Medicine

## 2023-06-07 VITALS — BP 144/74 | HR 71 | Temp 96.1°F | Resp 20

## 2023-06-07 DIAGNOSIS — Z79899 Other long term (current) drug therapy: Secondary | ICD-10-CM | POA: Diagnosis not present

## 2023-06-07 DIAGNOSIS — R634 Abnormal weight loss: Secondary | ICD-10-CM | POA: Insufficient documentation

## 2023-06-07 DIAGNOSIS — I10 Essential (primary) hypertension: Secondary | ICD-10-CM | POA: Insufficient documentation

## 2023-06-07 DIAGNOSIS — M549 Dorsalgia, unspecified: Secondary | ICD-10-CM | POA: Diagnosis not present

## 2023-06-07 DIAGNOSIS — J9620 Acute and chronic respiratory failure, unspecified whether with hypoxia or hypercapnia: Secondary | ICD-10-CM | POA: Insufficient documentation

## 2023-06-07 DIAGNOSIS — C3411 Malignant neoplasm of upper lobe, right bronchus or lung: Secondary | ICD-10-CM | POA: Diagnosis present

## 2023-06-07 DIAGNOSIS — Z7984 Long term (current) use of oral hypoglycemic drugs: Secondary | ICD-10-CM | POA: Diagnosis not present

## 2023-06-07 DIAGNOSIS — R0789 Other chest pain: Secondary | ICD-10-CM | POA: Diagnosis not present

## 2023-06-07 DIAGNOSIS — J449 Chronic obstructive pulmonary disease, unspecified: Secondary | ICD-10-CM | POA: Diagnosis not present

## 2023-06-07 DIAGNOSIS — E876 Hypokalemia: Secondary | ICD-10-CM | POA: Insufficient documentation

## 2023-06-07 DIAGNOSIS — E119 Type 2 diabetes mellitus without complications: Secondary | ICD-10-CM | POA: Diagnosis not present

## 2023-06-07 DIAGNOSIS — K30 Functional dyspepsia: Secondary | ICD-10-CM | POA: Insufficient documentation

## 2023-06-07 DIAGNOSIS — N132 Hydronephrosis with renal and ureteral calculous obstruction: Secondary | ICD-10-CM | POA: Insufficient documentation

## 2023-06-07 DIAGNOSIS — R5383 Other fatigue: Secondary | ICD-10-CM | POA: Diagnosis not present

## 2023-06-07 DIAGNOSIS — G4700 Insomnia, unspecified: Secondary | ICD-10-CM | POA: Insufficient documentation

## 2023-06-07 DIAGNOSIS — F17210 Nicotine dependence, cigarettes, uncomplicated: Secondary | ICD-10-CM | POA: Insufficient documentation

## 2023-06-07 DIAGNOSIS — Z8042 Family history of malignant neoplasm of prostate: Secondary | ICD-10-CM | POA: Insufficient documentation

## 2023-06-07 DIAGNOSIS — D3501 Benign neoplasm of right adrenal gland: Secondary | ICD-10-CM | POA: Insufficient documentation

## 2023-06-07 DIAGNOSIS — I700 Atherosclerosis of aorta: Secondary | ICD-10-CM | POA: Insufficient documentation

## 2023-06-07 DIAGNOSIS — K56699 Other intestinal obstruction unspecified as to partial versus complete obstruction: Secondary | ICD-10-CM | POA: Insufficient documentation

## 2023-06-07 DIAGNOSIS — Z87442 Personal history of urinary calculi: Secondary | ICD-10-CM | POA: Diagnosis not present

## 2023-06-07 DIAGNOSIS — J432 Centrilobular emphysema: Secondary | ICD-10-CM | POA: Insufficient documentation

## 2023-06-07 DIAGNOSIS — C771 Secondary and unspecified malignant neoplasm of intrathoracic lymph nodes: Secondary | ICD-10-CM | POA: Diagnosis not present

## 2023-06-07 DIAGNOSIS — Z933 Colostomy status: Secondary | ICD-10-CM | POA: Insufficient documentation

## 2023-06-07 MED ORDER — HEPARIN SOD (PORK) LOCK FLUSH 100 UNIT/ML IV SOLN
500.0000 [IU] | Freq: Once | INTRAVENOUS | Status: AC | PRN
  Administered 2023-06-07: 500 [IU]
  Filled 2023-06-07: qty 5

## 2023-06-07 MED ORDER — SODIUM CHLORIDE 0.9% FLUSH
10.0000 mL | Freq: Once | INTRAVENOUS | Status: AC | PRN
  Administered 2023-06-07: 10 mL
  Filled 2023-06-07: qty 10

## 2023-06-07 MED ORDER — SODIUM CHLORIDE 0.9 % IV SOLN
Freq: Once | INTRAVENOUS | Status: AC
  Filled 2023-06-07: qty 250

## 2023-06-09 ENCOUNTER — Inpatient Hospital Stay: Payer: Medicare PPO

## 2023-06-09 VITALS — BP 121/80 | HR 69 | Temp 95.3°F | Wt 151.6 lb

## 2023-06-09 DIAGNOSIS — C3411 Malignant neoplasm of upper lobe, right bronchus or lung: Secondary | ICD-10-CM | POA: Diagnosis not present

## 2023-06-09 MED ORDER — SODIUM CHLORIDE 0.9% FLUSH
10.0000 mL | Freq: Once | INTRAVENOUS | Status: AC | PRN
  Administered 2023-06-09: 10 mL
  Filled 2023-06-09: qty 10

## 2023-06-09 MED ORDER — HEPARIN SOD (PORK) LOCK FLUSH 100 UNIT/ML IV SOLN
500.0000 [IU] | Freq: Once | INTRAVENOUS | Status: AC | PRN
  Administered 2023-06-09: 500 [IU]
  Filled 2023-06-09: qty 5

## 2023-06-09 MED ORDER — SODIUM CHLORIDE 0.9 % IV SOLN
Freq: Once | INTRAVENOUS | Status: AC
  Filled 2023-06-09: qty 250

## 2023-06-14 ENCOUNTER — Inpatient Hospital Stay: Payer: Medicare PPO

## 2023-06-14 ENCOUNTER — Inpatient Hospital Stay (HOSPITAL_BASED_OUTPATIENT_CLINIC_OR_DEPARTMENT_OTHER): Payer: Medicare PPO | Admitting: Internal Medicine

## 2023-06-14 ENCOUNTER — Encounter: Payer: Self-pay | Admitting: Internal Medicine

## 2023-06-14 DIAGNOSIS — C3411 Malignant neoplasm of upper lobe, right bronchus or lung: Secondary | ICD-10-CM | POA: Diagnosis not present

## 2023-06-14 LAB — CMP (CANCER CENTER ONLY)
ALT: 7 U/L (ref 0–44)
AST: 15 U/L (ref 15–41)
Albumin: 3.5 g/dL (ref 3.5–5.0)
Alkaline Phosphatase: 91 U/L (ref 38–126)
Anion gap: 9 (ref 5–15)
BUN: 10 mg/dL (ref 8–23)
CO2: 25 mmol/L (ref 22–32)
Calcium: 8.7 mg/dL — ABNORMAL LOW (ref 8.9–10.3)
Chloride: 104 mmol/L (ref 98–111)
Creatinine: 1.1 mg/dL (ref 0.61–1.24)
GFR, Estimated: >60 mL/min (ref >60)
Glucose, Bld: 181 mg/dL — ABNORMAL HIGH (ref 70–99)
Potassium: 3.4 mmol/L — ABNORMAL LOW (ref 3.5–5.1)
Sodium: 138 mmol/L (ref 135–145)
Total Bilirubin: 0.3 mg/dL (ref 0.3–1.2)
Total Protein: 7.2 g/dL (ref 6.5–8.1)

## 2023-06-14 LAB — CBC WITH DIFFERENTIAL (CANCER CENTER ONLY)
Abs Immature Granulocytes: 0.03 10*3/uL (ref 0.00–0.07)
Basophils Absolute: 0.1 10*3/uL (ref 0.0–0.1)
Basophils Relative: 1 %
Eosinophils Absolute: 0.1 10*3/uL (ref 0.0–0.5)
Eosinophils Relative: 1 %
HCT: 33.9 % — ABNORMAL LOW (ref 39.0–52.0)
Hemoglobin: 10.9 g/dL — ABNORMAL LOW (ref 13.0–17.0)
Immature Granulocytes: 0 %
Lymphocytes Relative: 17 %
Lymphs Abs: 1.1 10*3/uL (ref 0.7–4.0)
MCH: 28.6 pg (ref 26.0–34.0)
MCHC: 32.2 g/dL (ref 30.0–36.0)
MCV: 89 fL (ref 80.0–100.0)
Monocytes Absolute: 0.4 10*3/uL (ref 0.1–1.0)
Monocytes Relative: 6 %
Neutro Abs: 5.2 10*3/uL (ref 1.7–7.7)
Neutrophils Relative %: 75 %
Platelet Count: 207 10*3/uL (ref 150–400)
RBC: 3.81 MIL/uL — ABNORMAL LOW (ref 4.22–5.81)
RDW: 13.6 % (ref 11.5–15.5)
WBC Count: 6.9 10*3/uL (ref 4.0–10.5)
nRBC: 0 % (ref 0.0–0.2)

## 2023-06-14 LAB — TSH: TSH: 3.641 u[IU]/mL (ref 0.350–4.500)

## 2023-06-14 MED ORDER — SODIUM CHLORIDE 0.9% FLUSH
10.0000 mL | Freq: Once | INTRAVENOUS | Status: AC
  Administered 2023-06-14: 10 mL via INTRAVENOUS
  Filled 2023-06-14: qty 10

## 2023-06-14 MED ORDER — SODIUM CHLORIDE 0.9 % IV SOLN
Freq: Once | INTRAVENOUS | Status: AC
  Filled 2023-06-14: qty 250

## 2023-06-14 MED ORDER — HEPARIN SOD (PORK) LOCK FLUSH 100 UNIT/ML IV SOLN
500.0000 [IU] | Freq: Once | INTRAVENOUS | Status: AC
  Administered 2023-06-14: 500 [IU] via INTRAVENOUS
  Filled 2023-06-14: qty 5

## 2023-06-14 MED ORDER — PREGABALIN 50 MG PO CAPS: 50.0000 mg | ORAL_CAPSULE | Freq: Two times a day (BID) | ORAL | 2 refills | Status: DC

## 2023-06-14 MED ORDER — FENTANYL 25 MCG/HR TD PT72
1.0000 | MEDICATED_PATCH | TRANSDERMAL | 0 refills | Status: DC
Start: 2023-06-14 — End: 2023-07-26

## 2023-06-14 MED ORDER — PROPRANOLOL HCL ER 60 MG PO CP24: 60.0000 mg | ORAL_CAPSULE | Freq: Every day | ORAL | 3 refills | Status: DC

## 2023-06-14 MED ORDER — ESZOPICLONE 1 MG PO TABS: ORAL_TABLET | ORAL | 3 refills | Status: DC

## 2023-06-14 MED ORDER — OXYCODONE HCL 5 MG PO TABS: 5.0000 mg | ORAL_TABLET | Freq: Four times a day (QID) | ORAL | 0 refills | Status: DC | PRN

## 2023-06-14 MED ORDER — HYDROXYZINE HCL 10 MG PO TABS: 10.0000 mg | ORAL_TABLET | Freq: Every evening | ORAL | 1 refills | Status: DC | PRN

## 2023-06-14 NOTE — Progress Notes (Signed)
[facility] CONSULT NOTE  Patient Care Team: [name], MD as PCP - General (Family Medicine) [name], RN as Oncology Nurse Navigator [name], [name], MD as Consulting Physician (Urology) [name], MD as Consulting Physician (Oncology) [name], MD as Consulting Physician (Pulmonary Disease)  CHIEF COMPLAINTS/PURPOSE OF CONSULTATION: lung cancer    Oncology History Overview Note  AUG 2022- 8.2 x 5.9 cm spiculated mass noted in right upper lobe consistent with malignancy. It extends from the right hilum to the lateral chest wall and results in lytic destruction of the lateral portion of the right fourth rib.  Mediastinal lymph nodes are noted, including 12 mm right hilar lymph node, consistent with metastatic disease. 3 cm right adrenal mass is noted concerning for metastatic disease.  # AUG 2022- PET IMPRESSION: Peripherally hypermetabolic right upper lobe mass, likely due to central necrosis. Mass extends into the right hilum and right lateral chest wall involving the second through fourth right ribs.   Mildly enlarged and hypermetabolic right hilar lymph node.   No evidence of metastatic disease in the abdomen or pelvis.  # AUG, 2022-RIGHT CHEST WALL Bx-necrosis; suspicious for malignancy not definitive [discussed with Dr.Kraynie;MDT] LUNG MASS, RIGHT; BIOPSY:  - SUSPICIOUS FOR MALIGNANCY.  - SEE COMMENT.   Comment:  Approximately six tissue cores demonstrate inflamed fibrous capsule with  hemosiderin-laden macrophages, in a background of abundant necrosis.  One of the cores demonstrates a minute fragment of viable epithelial  cells.  These cells are positive for p40.  They are negative for TTF1  and CK7. The cells of interest disappear on deeper cut levels.  While  the findings are suspicious for squamous cell carcinoma, there is not  enough viable tumor for a definitive diagnosis.  Additional tissue  sampling may be helpful.    # SEP 2022-cycle #2 Taxol hypersensitive reaction.  Switched over to #3 cycle- carbo-Abraxane starting 06/29/2021.  Discontinued IMFINZI therapy-December 2022 given poor tolerance/pneumonia admission to hospital.  #  [name] was in May-June 2023 x3 doses; stopped because of intolerance-fatigue patient preference/lack of progression.  # JAN 2024-bronchoscopy biopsy negative for malignancy; however PET scan concerning for recurrence/progression.    May 05, 2022-s/p bronchoscopy- with Dr.Aleskerov-significant purulent/necrotic debris noted. Dec 23rd, 2023- PET scan: The cavitary process in the right upper lobe is similar in size to previous CTS, although demonstrates prominent peripheral hypermetabolic activity. 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 Hypermetabolic activity within the adjacent upper right lateral chest wall with right 3rd through 5th rib deformities, suspicious for direct tumor spread. No evidence of distant osseous metastatic disease; Hypermetabolic left hilar lymph node worrisome for metastatic disease. No other hypermetabolic lymph nodes or distant metastases identified.  Bronchoscopy/EBUS [JAN 26th, 2024- Dr.A]- BAL/ 4R-lymph node FNA negative for malignancy.  Patient declined further invasive procedures.  # FEB 15t, 2024- RE-Start Keytruda.   # DEC 2022- s/p ID- Dr.ravishankar- ? Lung abscess-no antibiotics-  JAN 2023- CT scan incidental- cecal mass- colonoscopy[Dr.Russo; [facility]- high grade dysplasia]  S/p evaluation with Dr. Sherryle Lis surgery for now]   # S/p evaluation with Dr. Sheppard Penton.   Primary cancer of right upper lobe of lung (HCC)  06/03/2021 Initial Diagnosis   Primary cancer of right upper lobe of lung (HCC)   06/03/2021 Cancer Staging   Staging form: Lung, AJCC 8th Edition - Clinical: Stage IIIB (cT3, cN2, cM0) - Signed by  Louretta Shorten  R, MD on 06/03/2021   06/15/2021 - 08/03/2021 Chemotherapy   Patient is on Treatment Plan : LUNG Carboplatin / Paclitaxel + XRT q7d     10/02/2021 - 10/19/2021 Chemotherapy   Patient is on Treatment Plan : LUNG Durvalumab q14d     02/03/2022 -  Chemotherapy   Patient is on Treatment Plan : LUNG NSCLC Pembrolizumab (200) q21d     02/04/2022 - 03/18/2022 Chemotherapy   Patient is on Treatment Plan : LUNG NSCLC Pembrolizumab (200) q21d      HISTORY OF PRESENTING ILLNESS: Patient is ambulating independently.  Accompanied by wife.  Rodney Vega 67 y.o.  male history of smoking -"lung cancer" [Limited necrotic tissue]-locally advanced unresectable- currently on surveillance [APRIL 2024-Keytruda was discontinued because of poor tolerance/patient preference] ; and Cecal mass-high grade dysplasia intact-  is here for follow-up/a nd review the results of recent MRI brain.   o pain this weekend right chest and abdomen. No pain today. Taking oxycodone.   C/o back pain at times. Looks like one side of the back is sunk in.   Having a lot if indigestion, using tums helps some.   Appetite is 50% normal, no supplements.   Refill requests, pendin  Patient fatigued. Pain is increasing in his right chest area and back. He is sleeping 18 to 20 hours a day.   He states he losses sensation in lower legs and can't get them to hold his weight. Sugars bet 70 and 100. Off all diabetes meds. Forgetful, losing short term memory.   No appetite to speak of; he takes small amounts/ bites. Denies nausea. Smokes weed and then will get the munchies.Taking a stool softener to help bowels. Continues to smoke about 1/2  pack cigarettes per day.   Patient currently taking oxycodone 2 to 3 pills a day and continues to be on fentanyl patch for his right-sided chest wall pain.  Notes have intermittent episodes of difficulty breathing currently stable.  He continues to get IV fluids-twice a week-feels improved  after IV infusions. Denies abdominal pain nausea vomiting or diarrhea.  No blood in stools black or stools.  Review of Systems  Constitutional:  Positive for malaise/fatigue and weight loss. Negative for chills, diaphoresis and fever.  HENT:  Negative for nosebleeds and sore throat.   Eyes:  Negative for double vision.  Respiratory:  Positive for cough, sputum production and shortness of breath. Negative for hemoptysis and wheezing.   Cardiovascular:  Positive for chest pain. Negative for palpitations, orthopnea and leg swelling.  Gastrointestinal:  Positive for nausea. Negative for abdominal pain, blood in stool, constipation, diarrhea, heartburn, melena and vomiting.  Genitourinary:  Negative for dysuria, frequency and urgency.  Musculoskeletal:  Negative for back pain and joint pain.  Skin: Negative.  Negative for itching and rash.  Neurological:  Positive for tingling. Negative for dizziness, focal weakness, weakness and headaches.  Endo/Heme/Allergies:  Does not bruise/bleed easily.  Psychiatric/Behavioral:  Negative for depression. The patient is not nervous/anxious and does not have insomnia.      MEDICAL HISTORY:  Past Medical History:  Diagnosis Date   Acute on chronic respiratory failure (HCC)    Anemia    Cancer (HCC)    Colostomy present (HCC)    COPD (chronic obstructive pulmonary disease) (HCC)    Degenerative arthritis of knee    Depression    Diabetes mellitus without complication (HCC)    Diverticulitis    Dyspnea    Glaucoma    since 2004  Hernia 1979   History of kidney stones    History of methicillin resistant staphylococcus aureus (MRSA)    Hypertension    Hypokalemia    Marijuana use    Mass of cecum    Nephrolithiasis    Neuromuscular disorder (HCC)    Personal history of tobacco use, presenting hazards to health    Pneumonia    Port-A-Cath in place    Pre-diabetes    Primary cancer of right upper lobe of lung (HCC)    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 Carmell Austria sessile polyp,11mm, found in rectum,multiple diverticula were found in the sigmoid, in descending and in transverse colon   COLONOSCOPY WITH PROPOFOL N/A 10/22/2021   Procedure: COLONOSCOPY WITH PROPOFOL;  Surgeon: Jaynie Collins, DO;  Location: [facility] ENDOSCOPY;  Service: Gastroenterology;  Laterality: N/A;  DM   COLOSTOMY  04/20/2013   EYE SURGERY     HERNIA REPAIR  1979   inguinal   IR IMAGING GUIDED PORT INSERTION  06/10/2021   JOINT REPLACEMENT Right    knee   KNEE ARTHROPLASTY Right 05/08/2018   Procedure: COMPUTER ASSISTED TOTAL KNEE ARTHROPLASTY;  Surgeon: Donato Heinz, MD;  Location: [facility];  Service: Orthopedics;  Laterality: Right;   KNEE ARTHROPLASTY Left 11/16/2019   Procedure: COMPUTER ASSISTED TOTAL KNEE ARTHROPLASTY;  Surgeon: Donato Heinz, MD;  Location: [facility];  Service: Orthopedics;  Laterality: Left;   LAPAROTOMY  04/20/2013   colon resection with colosotmy   LITHOTRIPSY  2013   LUNG BIOPSY  2023   RIGID BRONCHOSCOPY N/A 10/29/2022   Procedure: RIGID BRONCHOSCOPY;  Surgeon: [name], MD;  Location: [facility];  Service: Thoracic;  Laterality: N/A;   TONSILLECTOMY AND ADENOIDECTOMY  1962   VIDEO BRONCHOSCOPY WITH ENDOBRONCHIAL NAVIGATION N/A 05/05/2022   Procedure: VIDEO BRONCHOSCOPY WITH ENDOBRONCHIAL NAVIGATION;  Surgeon: [name], MD;  Location: [facility];  Service: Thoracic;  Laterality: N/A;   VIDEO BRONCHOSCOPY WITH ENDOBRONCHIAL ULTRASOUND N/A 05/05/2022   Procedure: VIDEO BRONCHOSCOPY WITH ENDOBRONCHIAL ULTRASOUND;  Surgeon: [name], MD;  Location: [facility];  Service: Thoracic;  Laterality: N/A;   VIDEO BRONCHOSCOPY WITH ENDOBRONCHIAL ULTRASOUND N/A 10/29/2022   Procedure: VIDEO BRONCHOSCOPY WITH ENDOBRONCHIAL ULTRASOUND;  Surgeon: [name], MD;  Location: [facility];  Service: Thoracic;  Laterality: N/A;    SOCIAL HISTORY: Social History   Socioeconomic History   Marital status: Married    Spouse name: Pegge   Number of children: Not on file   Years of education: Not on file   Highest education level: Not on file  Occupational History   Not on file  Tobacco Use   Smoking status: Every Day    Current packs/day: 1.00    Average packs/day: 1 pack/day for 30.0 years (30.0 ttl pk-yrs)    Types: Cigarettes   Smokeless tobacco: Never  Tobacco comments:    since age 89 or 39  Vaping Use   Vaping status: Never Used  Substance and Sexual Activity   Alcohol use: Yes    Alcohol/week: 2.0 standard drinks of alcohol    Types: 2 Standard drinks or equivalent per week    Comment: rare   Drug use: Yes    Types: Marijuana    Comment: fentanyl patch prescribed & oxycodone   Sexual activity: Not on file  Other Topics Concern   Not on file  Social History Narrative   [place] /[place]; smoking; not much alcohol. Worked for [employer], 2019. Marland Kitchen    Social Determinants of Health   Financial Resource Strain: Low Risk  (08/10/2022)   Overall Financial Resource Strain (CARDIA)    Difficulty of Paying Living Expenses: Not very hard  Food Insecurity: No Food Insecurity (01/20/2023)   Hunger Vital Sign    Worried About Running Out of Food in the Last Year: Never true    Ran Out of Food in the Last Year: Never true  Transportation Needs: No Transportation Needs (01/20/2023)   PRAPARE -  Administrator, Civil Service (Medical): No    Lack of Transportation (Non-Medical): No  Physical Activity: Not on file  Stress: No Stress Concern Present (08/10/2022)   [employer] of Occupational Health - Occupational Stress Questionnaire    Feeling of Stress : Not at all  Social Connections: Unknown (08/10/2022)   Social Connection and Isolation Panel [NHANES]    Frequency of Communication with Friends and Family: Three times a week    Frequency of Social Gatherings with Friends and Family: Twice a week    Attends Religious Services: Patient declined    Database administrator or Organizations: No    Attends Banker Meetings: Never    Marital Status: Married  Catering manager Violence: Not At Risk (01/20/2023)   Humiliation, Afraid, Rape, and Kick questionnaire    Fear of Current or Ex-Partner: No    Emotionally Abused: No    Physically Abused: No    Sexually Abused: No    FAMILY HISTORY: Family History  Problem Relation Age of Onset   Diabetes Mother    Heart disease Mother    Heart attack Father    Prostate cancer Father     ALLERGIES:  is allergic to paclitaxel, ambien [zolpidem], nsaids, and aleve [naproxen sodium].  MEDICATIONS:  Current Outpatient Medications  Medication Sig Dispense Refill   Accu-Chek Softclix Lancets lancets USE AS DIRECTED FOUR TIMES DAILY 100 each 12   acetaminophen (TYLENOL) 500 MG tablet Take 2 tablets (1,000 mg total) by mouth daily as needed. Home med. 30 tablet 0   albuterol (PROVENTIL) (2.5 MG/3ML) 0.083% nebulizer solution Take 2.5 mg by nebulization every 4 (four) hours as needed for wheezing or shortness of breath.     albuterol (VENTOLIN HFA) 108 (90 Base) MCG/ACT inhaler SMARTSIG:2 inhalation Via Inhaler Every 6 Hours PRN 1 each 2   blood glucose meter kit and supplies KIT Check your blood glucose levels twice a day; once in the morning before breakfast; and once in the evening after dinner. 1 each 0    BREZTRI AEROSPHERE 160-9-4.8 MCG/ACT AERO Inhale 2 puffs into the lungs 2 (two) times daily.     dextromethorphan-guaiFENesin (MUCINEX DM) 30-600 MG 12hr tablet Take 1 tablet by mouth 2 (two) times daily as needed for cough.     dorzolamide-timolol (COSOPT) 22.3-6.8 MG/ML ophthalmic solution Place  1 drop into both eyes 2 (two) times daily.     glucose blood test strip Check your blood glucose levels twice a day; once in the morning before breakfast; and once in the evening after dinner. 100 each 12   ipratropium-albuterol (DUONEB) 0.5-2.5 (3) MG/3ML SOLN Take 3 mLs by nebulization every 6 (six) hours as needed. 360 mL 1   latanoprost (XALATAN) 0.005 % ophthalmic solution Place 1 drop into both eyes at bedtime.     lidocaine-prilocaine (EMLA) cream Apply 1 Application topically as needed. 30 g 3   montelukast (SINGULAIR) 10 MG tablet TAKE 1 TABLET(10 MG) BY MOUTH AT BEDTIME 90 tablet 0   Multiple Vitamin (MULTIVITAMIN WITH MINERALS) TABS tablet Take 1 tablet by mouth daily.     Respiratory Therapy Supplies (NEBULIZER) DEVI Use as directed 1 each 0   tamsulosin (FLOMAX) 0.4 MG CAPS capsule Take 0.4 mg by mouth daily after supper.     triamcinolone ointment (KENALOG) 0.5 % Apply 1 Application topically 2 (two) times daily. 30 g 1   venlafaxine XR (EFFEXOR-XR) 75 MG 24 hr capsule Take 225 mg by mouth daily with breakfast.     eszopiclone (LUNESTA) 1 MG TABS tablet TAKE 1 TABLET(1 MG) BY MOUTH AT BEDTIME AS NEEDED FOR SLEEP 30 tablet 3   fentaNYL (DURAGESIC) 25 MCG/HR Place 1 patch onto the skin every 3 (three) days. 10 patch 0   glipiZIDE (GLUCOTROL) 5 MG tablet TAKE 1 TABLET(5 MG) BY MOUTH DAILY BEFORE BREAKFAST (Patient not taking: Reported on 05/17/2023) 90 tablet 2   hydrOXYzine (ATARAX) 10 MG tablet Take 1 tablet (10 mg total) by mouth at bedtime as needed for itching. 30 tablet 1   metFORMIN (GLUCOPHAGE) 500 MG tablet Take 1 tablet (500 mg total) by mouth 2 (two) times daily with a meal. (Patient  not taking: Reported on 05/17/2023) 60 tablet 2   naloxone (NARCAN) nasal spray 4 mg/0.1 mL Place 1 spray into the nose once. (Patient not taking: Reported on 05/17/2023)     olmesartan (BENICAR) 40 MG tablet Take 1 tablet (40 mg total) by mouth every morning. (Patient not taking: Reported on 02/22/2023) 30 tablet 3   oxyCODONE (OXY IR/ROXICODONE) 5 MG immediate release tablet Take 1 tablet (5 mg total) by mouth every 6 (six) hours as needed for severe pain. 90 tablet 0   pregabalin (LYRICA) 50 MG capsule Take 1 capsule (50 mg total) by mouth 2 (two) times daily. 60 capsule 2   prochlorperazine (COMPAZINE) 10 MG tablet TAKE 1 TABLET(10 MG) BY MOUTH EVERY 6 HOURS AS NEEDED FOR NAUSEA OR VOMITING (Patient not taking: Reported on 05/17/2023) 30 tablet 1   propranolol ER (INDERAL LA) 60 MG 24 hr capsule Take 1 capsule (60 mg total) by mouth daily. 30 capsule 3   vitamin B-12 (CYANOCOBALAMIN) 500 MCG tablet Take 1 tablet by mouth daily. (Patient not taking: Reported on 05/17/2023)     No current facility-administered medications for this visit.   Facility-Administered Medications Ordered in Other Visits  Medication Dose Route Frequency Provider Last Rate Last Admin   heparin lock flush 100 UNIT/ML injection            heparin lock flush 100 UNIT/ML injection            heparin lock flush 100 UNIT/ML injection            heparin lock flush 100 unit/mL  500 Units Intravenous Once [name], MD  PHYSICAL EXAMINATION: ECOG PERFORMANCE STATUS: 1 - Symptomatic but completely ambulatory  Vitals:   06/14/23 0924  BP: (!) 140/80  Pulse: 63  Temp: 97.6 F (36.4 C)  SpO2: 100%    Filed Weights   06/14/23 0924  Weight: 160 lb 4.8 oz (72.7 kg)     Physical Exam Vitals and nursing note reviewed.  HENT:     Head: Normocephalic and atraumatic.     Mouth/Throat:     Pharynx: Oropharynx is clear.  Eyes:     Extraocular Movements: Extraocular movements intact.     Pupils: Pupils are  equal, round, and reactive to light.  Cardiovascular:     Rate and Rhythm: Normal rate and regular rhythm.  Pulmonary:     Comments: Decreased breath sounds bilaterally.  Abdominal:     Palpations: Abdomen is soft.  Musculoskeletal:        General: Normal range of motion.     Cervical back: Normal range of motion.  Skin:    General: Skin is warm.  Neurological:     General: No focal deficit present.     Mental Status: He is alert and oriented to person, place, and time.  Psychiatric:        Behavior: Behavior normal.        Judgment: Judgment normal.    LABORATORY DATA:  I have reviewed the data as listed Lab Results  Component Value Date   WBC 6.9 06/14/2023   HGB 10.9 (L) 06/14/2023   HCT 33.9 (L) 06/14/2023   MCV 89.0 06/14/2023   PLT 207 06/14/2023   Recent Labs    04/20/23 0937 05/10/23 1010 05/17/23 0859 06/14/23 0929  NA 136 138 137 138  K 3.2* 3.4* 3.6 3.4*  CL 101 104 103 104  CO2 27 27 26 25   GLUCOSE 170* 155* 118* 181*  BUN 9 9 10 10   CREATININE 1.00 1.09 1.07 1.10  CALCIUM 8.6* 8.6* 8.6* 8.7*  GFRNONAA >60 >60 >60 >60  PROT 6.9  --  7.0 7.2  ALBUMIN 3.4*  --  3.3* 3.5  AST 12*  --  10* 15  ALT 6  --  7 7  ALKPHOS 88  --  98 91  BILITOT 0.4  --  0.5 0.3    RADIOGRAPHIC STUDIES: I have personally reviewed the radiological images as listed and agreed with the findings in the report. No results found.  ASSESSMENT & PLAN:   Primary cancer of right upper lobe of lung (HCC) # UNRESECTABLE/LOCALLY ADVANCED- Right upper lobe lung mass -concerning for malignancy [biopsy inconclusive/atypical cells]-  April 2023- PD-L1 positive [cirucologene].   Currently OFF therapy/on surveillance [given poor tolerance to Keytruda]. 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  Ill-defined peribronchovascular ground-glass nodularity throughout the lungs,  right greater than left. Endobronchial spread of tumor can have this appearance. Enlarging left hilar lymph node, worrisome for a metastasis. Destruction and pathologic fractures involving the right third through sixth ribs, as before.  I reviewed imaging with Dr.Aleskerov; pulmonary.  See discussion below regarding prognosis  # continue surveillance off therapy- on a clinical basis.  Will plan to continue surveillance every 3 months imaging also.  Patient however wants to continue symptomatic management with IV fluids; supportive care as needed- but slow clinical progression noted. Will order imaging at next visit.   # Anemia- mild to moderate- feb 2024- iron sat- 17%; ferritin- 61- stable   #  Right chest wall pain-/pleuritic-rib destruction malignancy versus radiation- ADDED back [march 2024; [name]]-fenatny patch 25 mcg- Continue Oxycodone 5 mg every 8  hours; worse- will increase to q 6 hours prn.   # COPD/fatigue- [coughing/phlegm/ no fevers]- on albuterol/advair [smoking]- TSH- WNL;Continue  Xopenex/ipratropium nebs-; Mucinex- DM BID.stable.   # Hypokalemia-3.3 - continue potassium 20 mEq liquid.-Hypocalemia: 8.4/albumin- 3.2. MARCH 2024-vit D 105- HOLD off -Vit D 50,K.stable.   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 Lemar Livings [Dr.Russow[facility]] CT APRIL- 2024- mass lesion identified along the cecum obstructing the orifice of the appendix. Currently  surveillance given patient's multiple comorbidities/reluctance with any surgical options- WORSE- ? Impending obstruction- will discuss with surgery/GI re: colonoscopy.   # Labile Blood glucose- BG as low as 25; taken off glipizide-  Metformin [PCP]- stable.   # IV mediport: functioning/stable.   # Insomnia-continue Lunesta refilled- stable.   *appt thru mychart   # DISPOSITION: # IVFs over 1 hour. NO  KCL 20 IV.   # in  1 week- IVFs-1 lit /1 hourTuesdays/Thursday;     #  follow up in 2 week Tuesday- MD; labs- cbc/cmp; TSH;  IVFs-1 lit /1 hour;KCL 20 IV; - Thursday IVFs-1 lit /1 hour -CT CAP in 1 week-  Dr.B ---------------------------------  # in 2 weeks- lab-  IVFs-1 lit /1 hourTuesdays/Thursday   # # 3 weeks-  IVFs-1 lit /1 hourTuesdays/Thursday  # follow up in 4 week Tuesday- MD; labs- cbc/cmp; TSH;  IVFs-1 lit /1 hour;KCL 20 IV; - Thursday IVFs-1 lit /1 hour - Dr.B      All questions were answered. The patient knows to call the clinic with any problems, questions or concerns.    [name], MD 06/14/2023 11:03 AM

## 2023-06-14 NOTE — Assessment & Plan Note (Addendum)
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   Currently OFF therapy/on surveillance [given poor tolerance to Keytruda]. 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  Ill-defined peribronchovascular ground-glass nodularity throughout the lungs, right greater than left. Endobronchial spread of tumor can have this appearance. Enlarging left hilar lymph node, worrisome for a metastasis. Destruction and pathologic fractures involving the right third through sixth ribs, as before.  I reviewed imaging with Dr.Aleskerov; pulmonary.  See discussion below regarding prognosis  # continue surveillance off therapy- on a clinical basis.  Will plan to continue surveillance every 3 months imaging also.  Patient however wants to continue symptomatic management with IV fluids; supportive care as needed- but slow clinical progression noted. Will order imaging at next visit.   # Anemia- mild to moderate- feb 2024- iron sat- 17%; ferritin- 61- stable   #Right chest wall pain-/pleuritic-rib destruction malignancy versus radiation- ADDED back [march 2024; [name]]-fenatny patch 25 mcg- Continue Oxycodone 5 mg every 8  hours; worse- will increase to q 6 hours prn.   # COPD/fatigue- [coughing/phlegm/ no fevers]- on albuterol/advair [smoking]- TSH- WNL;Continue  Xopenex/ipratropium nebs-; Mucinex- DM BID.stable.   # Hypokalemia-3.3 - continue potassium 20 mEq liquid.-Hypocalemia: 8.4/albumin- 3.2. MARCH 2024-vit D 105- HOLD off -Vit D 50,K.stable.   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 Lemar Livings [Dr.[facility]] CT APRIL- 2024- mass lesion identified along the cecum  obstructing the orifice of the appendix. Currently  surveillance given patient's multiple comorbidities/reluctance with any surgical options- WORSE- ? Impending obstruction- will discuss with surgery/GI re: colonoscopy.   # Labile Blood glucose- BG as low as 25; taken off glipizide-  Metformin [PCP]- stable.   # IV mediport: functioning/stable.   # Insomnia-continue Lunesta refilled- stable.   *appt thru mychart   # DISPOSITION: # IVFs over 1 hour. NO  KCL 20 IV.   # in 1 week- IVFs-1 lit /1 hourTuesdays/Thursday;     #  follow up in 2 week Tuesday- MD; labs- cbc/cmp; TSH;  IVFs-1 lit /1 hour;KCL 20 IV; - Thursday IVFs-1 lit /1 hour -CT CAP in 1 week-  Dr.B ---------------------------------  # in 2 weeks- lab-  IVFs-1 lit /1 hourTuesdays/Thursday   # # 3 weeks-  IVFs-1 lit /1 hourTuesdays/Thursday  # follow up in 4 week Tuesday- MD; labs- cbc/cmp; TSH;  IVFs-1 lit /1 hour;KCL 20 IV; - Thursday IVFs-1 lit /1 hour - Dr.B

## 2023-06-14 NOTE — Progress Notes (Signed)
C/o pain this weekend right chest and abdomen. No pain today. Taking oxycodone.  C/o back pain at times. Looks like one side of the back is sunk in.  Having a lot if indigestion, using tums helps some.  Appetite is 50% normal, no supplements.  Refill requests, pending.

## 2023-06-16 ENCOUNTER — Inpatient Hospital Stay: Payer: Medicare PPO

## 2023-06-16 VITALS — BP 137/76 | HR 79 | Temp 96.2°F | Resp 20

## 2023-06-16 DIAGNOSIS — C3411 Malignant neoplasm of upper lobe, right bronchus or lung: Secondary | ICD-10-CM

## 2023-06-16 MED ORDER — SODIUM CHLORIDE 0.9 % IV SOLN
Freq: Once | INTRAVENOUS | Status: AC
  Filled 2023-06-16: qty 250

## 2023-06-16 MED ORDER — HEPARIN SOD (PORK) LOCK FLUSH 100 UNIT/ML IV SOLN
500.0000 [IU] | Freq: Once | INTRAVENOUS | Status: AC | PRN
  Administered 2023-06-16: 500 [IU]
  Filled 2023-06-16: qty 5

## 2023-06-16 MED ORDER — SODIUM CHLORIDE 0.9% FLUSH
10.0000 mL | Freq: Once | INTRAVENOUS | Status: AC | PRN
  Administered 2023-06-16: 10 mL
  Filled 2023-06-16: qty 10

## 2023-06-20 ENCOUNTER — Ambulatory Visit
Admission: RE | Admit: 2023-06-20 | Discharge: 2023-06-20 | Disposition: A | Discharge status: Home / Self Care | Payer: Medicare PPO | Source: Ambulatory Visit | Attending: Internal Medicine | Admitting: Internal Medicine

## 2023-06-20 DIAGNOSIS — C3411 Malignant neoplasm of upper lobe, right bronchus or lung: Secondary | ICD-10-CM | POA: Insufficient documentation

## 2023-06-20 MED ORDER — BARIUM SULFATE 2 % PO SUSP
900.0000 mL | Freq: Once | ORAL | Status: AC
  Administered 2023-06-20: 900 mL via ORAL

## 2023-06-20 MED ORDER — IOHEXOL 300 MG/ML  SOLN
100.0000 mL | Freq: Once | INTRAMUSCULAR | Status: AC | PRN
  Administered 2023-06-20: 100 mL via INTRAVENOUS

## 2023-06-21 ENCOUNTER — Inpatient Hospital Stay: Payer: Medicare PPO

## 2023-06-21 VITALS — BP 118/76 | HR 66 | Temp 96.2°F | Resp 20

## 2023-06-21 DIAGNOSIS — C3411 Malignant neoplasm of upper lobe, right bronchus or lung: Secondary | ICD-10-CM | POA: Diagnosis not present

## 2023-06-21 MED ORDER — HEPARIN SOD (PORK) LOCK FLUSH 100 UNIT/ML IV SOLN
500.0000 [IU] | Freq: Once | INTRAVENOUS | Status: AC | PRN
  Administered 2023-06-21: 500 [IU]
  Filled 2023-06-21: qty 5

## 2023-06-21 MED ORDER — SODIUM CHLORIDE 0.9 % IV SOLN
Freq: Once | INTRAVENOUS | Status: AC
  Filled 2023-06-21: qty 250

## 2023-06-21 MED ORDER — SODIUM CHLORIDE 0.9% FLUSH
10.0000 mL | Freq: Once | INTRAVENOUS | Status: AC | PRN
  Administered 2023-06-21: 10 mL
  Filled 2023-06-21: qty 10

## 2023-06-23 ENCOUNTER — Inpatient Hospital Stay: Payer: Medicare PPO

## 2023-06-23 VITALS — BP 113/69 | HR 80 | Temp 96.2°F | Resp 20

## 2023-06-23 DIAGNOSIS — C3411 Malignant neoplasm of upper lobe, right bronchus or lung: Secondary | ICD-10-CM

## 2023-06-23 MED ORDER — HEPARIN SOD (PORK) LOCK FLUSH 100 UNIT/ML IV SOLN
500.0000 [IU] | Freq: Once | INTRAVENOUS | Status: AC | PRN
  Administered 2023-06-23: 500 [IU]
  Filled 2023-06-23: qty 5

## 2023-06-23 MED ORDER — SODIUM CHLORIDE 0.9 % IV SOLN
Freq: Once | INTRAVENOUS | Status: AC
  Filled 2023-06-23: qty 250

## 2023-06-23 MED ORDER — SODIUM CHLORIDE 0.9% FLUSH
10.0000 mL | Freq: Once | INTRAVENOUS | Status: AC | PRN
  Administered 2023-06-23: 10 mL
  Filled 2023-06-23: qty 10

## 2023-06-29 ENCOUNTER — Inpatient Hospital Stay: Payer: Medicare PPO

## 2023-06-29 ENCOUNTER — Inpatient Hospital Stay (HOSPITAL_BASED_OUTPATIENT_CLINIC_OR_DEPARTMENT_OTHER): Payer: Medicare PPO | Admitting: Internal Medicine

## 2023-06-29 ENCOUNTER — Encounter: Payer: Self-pay | Admitting: Internal Medicine

## 2023-06-29 VITALS — BP 119/75 | HR 82 | Temp 98.6°F | Ht 71.0 in | Wt 159.0 lb

## 2023-06-29 DIAGNOSIS — C3411 Malignant neoplasm of upper lobe, right bronchus or lung: Secondary | ICD-10-CM | POA: Diagnosis not present

## 2023-06-29 LAB — CMP (CANCER CENTER ONLY)
ALT: 7 U/L (ref 0–44)
AST: 12 U/L — ABNORMAL LOW (ref 15–41)
Albumin: 3.4 g/dL — ABNORMAL LOW (ref 3.5–5.0)
Alkaline Phosphatase: 92 U/L (ref 38–126)
Anion gap: 6 (ref 5–15)
BUN: 9 mg/dL (ref 8–23)
CO2: 27 mmol/L (ref 22–32)
Calcium: 8.7 mg/dL — ABNORMAL LOW (ref 8.9–10.3)
Chloride: 103 mmol/L (ref 98–111)
Creatinine: 1.17 mg/dL (ref 0.61–1.24)
GFR, Estimated: >60 mL/min (ref >60)
Glucose, Bld: 188 mg/dL — ABNORMAL HIGH (ref 70–99)
Potassium: 3.3 mmol/L — ABNORMAL LOW (ref 3.5–5.1)
Sodium: 136 mmol/L (ref 135–145)
Total Bilirubin: 0.3 mg/dL (ref 0.3–1.2)
Total Protein: 6.9 g/dL (ref 6.5–8.1)

## 2023-06-29 LAB — CBC WITH DIFFERENTIAL (CANCER CENTER ONLY)
Abs Immature Granulocytes: 0.03 10*3/uL (ref 0.00–0.07)
Basophils Absolute: 0.1 10*3/uL (ref 0.0–0.1)
Basophils Relative: 1 %
Eosinophils Absolute: 0.4 10*3/uL (ref 0.0–0.5)
Eosinophils Relative: 6 %
HCT: 36.2 % — ABNORMAL LOW (ref 39.0–52.0)
Hemoglobin: 11.8 g/dL — ABNORMAL LOW (ref 13.0–17.0)
Immature Granulocytes: 1 %
Lymphocytes Relative: 15 %
Lymphs Abs: 1 10*3/uL (ref 0.7–4.0)
MCH: 28.9 pg (ref 26.0–34.0)
MCHC: 32.6 g/dL (ref 30.0–36.0)
MCV: 88.5 fL (ref 80.0–100.0)
Monocytes Absolute: 0.5 10*3/uL (ref 0.1–1.0)
Monocytes Relative: 7 %
Neutro Abs: 4.7 10*3/uL (ref 1.7–7.7)
Neutrophils Relative %: 70 %
Platelet Count: 219 10*3/uL (ref 150–400)
RBC: 4.09 MIL/uL — ABNORMAL LOW (ref 4.22–5.81)
RDW: 13.5 % (ref 11.5–15.5)
WBC Count: 6.7 10*3/uL (ref 4.0–10.5)
nRBC: 0 % (ref 0.0–0.2)

## 2023-06-29 LAB — TSH: TSH: 7.86 u[IU]/mL — ABNORMAL HIGH (ref 0.350–4.500)

## 2023-06-29 MED ORDER — HEPARIN SOD (PORK) LOCK FLUSH 100 UNIT/ML IV SOLN
500.0000 [IU] | Freq: Once | INTRAVENOUS | Status: DC | PRN
  Filled 2023-06-29: qty 5

## 2023-06-29 MED ORDER — SODIUM CHLORIDE 0.9 % IV SOLN
Freq: Once | INTRAVENOUS | Status: AC
  Filled 2023-06-29: qty 250

## 2023-06-29 MED ORDER — POTASSIUM CHLORIDE 20 MEQ/100ML IV SOLN
20.0000 meq | Freq: Once | INTRAVENOUS | Status: AC
  Administered 2023-06-29: 20 meq via INTRAVENOUS

## 2023-06-29 MED ORDER — HEPARIN SOD (PORK) LOCK FLUSH 100 UNIT/ML IV SOLN
500.0000 [IU] | Freq: Once | INTRAVENOUS | Status: AC
  Administered 2023-06-29: 500 [IU] via INTRAVENOUS
  Filled 2023-06-29: qty 5

## 2023-06-29 MED ORDER — SODIUM CHLORIDE 0.9% FLUSH
10.0000 mL | Freq: Once | INTRAVENOUS | Status: DC | PRN
  Filled 2023-06-29: qty 10

## 2023-06-29 MED ORDER — SODIUM CHLORIDE 0.9% FLUSH
10.0000 mL | Freq: Once | INTRAVENOUS | Status: AC
  Administered 2023-06-29: 10 mL via INTRAVENOUS
  Filled 2023-06-29: qty 10

## 2023-06-29 NOTE — Progress Notes (Signed)
Wife states you told them he could take up to 4 oxycodones a day, but rx states 3 a day. He does have to take 4 a day "more days than normal".   CT CAP 06/20/23.

## 2023-06-29 NOTE — Assessment & Plan Note (Addendum)
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   Currently OFF therapy/on surveillance [given poor tolerance to Keytruda]. CT SEP 16th, 2024- . Relatively similar infiltrative right upper lobe lung mass with direct chest wall invasion and secondary bronchopulmonary fistula;  Similar left hilar adenopathy;  No change in peribronchovascular ill-defined micronodularity which could represent chronic atypical infection or endobronchial spread of tumor.   # continue surveillance off therap [since apri 2024- sec to poor tolerance] on a clinical basis.  Patient however wants to continue symptomatic management with IV fluids; supportive care as needed- but slow clinical progression noted. Stable.   # Anemia- mild to moderate- feb 2024- iron sat- 17%; ferritin- 61-  Stable.    #Right chest wall pain-/pleuritic-rib destruction malignancy versus radiation- ADDED back [march 2024; [name]]-fenatny patch 25 mcg- Continue Oxycodone 5 mg every 8  hours; worse- will increase to q 6 hours prn.   # COPD/fatigue- [coughing/phlegm/ no fevers]- on albuterol/advair [smoking]- TSH- WNL;Continue  Xopenex/ipratropium nebs-; Mucinex- DM BID Stable.   # Hypokalemia-3.3 - continue potassium 20 mEq liquid.-Hypocalemia: 8.4/albumin- 3.2. MARCH 2024-vit D 105- HOLD off -Vit D 50,K. Stable.   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 Lemar Livings [Dr.[facility]] CT  SEP 2024- mass lesion identified along the cecum obstructing the orifice of the appendix. Currently  surveillance given patient's multiple comorbidities/reluctance with any surgical options-  stable; discussed with Dr.Cintron.   # Labile Blood glucose- BG as low as 25; taken off glipizide-  Metformin [PCP]- stable.   # IV mediport:  functioning/stable.   # Insomnia-continue Lunesta refilled- stable.   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  .I reviewed/discussed/counseled the patient.   *appt thru mychart   # DISPOSITION: # IVFs over 1 hour.  KCL 20 IV.   # in 1 week- IVFs-1 lit /1 hourTuesdays/Thursday;     # in 2 weeks- lab-  IVFs-1 lit /1 hourTuesdays/Thursday   # # 3 weeks-  IVFs-1 lit /1 hourTuesdays/Thursday  # follow up in 4 week Tuesday- MD; labs- cbc/cmp; TSH;  IVFs-1 lit /1 hour;KCL 20 IV; - Thursday IVFs-1 lit /1 hour - Dr.B  # I reviewed the blood work- with the patient in detail; also reviewed the imaging independently [as summarized above]; and with the patient in detail.

## 2023-06-29 NOTE — Progress Notes (Signed)
[facility] CONSULT NOTE  Patient Care Team: [name], MD as PCP - General (Family Medicine) [name], RN as Oncology Nurse Navigator [name], [name], MD as Consulting Physician (Urology) [name], MD as Consulting Physician (Oncology) [name], MD as Consulting Physician (Pulmonary Disease)  CHIEF COMPLAINTS/PURPOSE OF CONSULTATION: lung cancer    Oncology History Overview Note  AUG 2022- 8.2 x 5.9 cm spiculated mass noted in right upper lobe consistent with malignancy. It extends from the right hilum to the lateral chest wall and results in lytic destruction of the lateral portion of the right fourth rib.  Mediastinal lymph nodes are noted, including 12 mm right hilar lymph node, consistent with metastatic disease. 3 cm right adrenal mass is noted concerning for metastatic disease.  # AUG 2022- PET IMPRESSION: Peripherally hypermetabolic right upper lobe mass, likely due to central necrosis. Mass extends into the right hilum and right lateral chest wall involving the second through fourth right ribs.   Mildly enlarged and hypermetabolic right hilar lymph node.   No evidence of metastatic disease in the abdomen or pelvis.  # AUG, 2022-RIGHT CHEST WALL Bx-necrosis; suspicious for malignancy not definitive [discussed with Dr.Kraynie;MDT] LUNG MASS, RIGHT; BIOPSY:  - SUSPICIOUS FOR MALIGNANCY.  - SEE COMMENT.   Comment:  Approximately six tissue cores demonstrate inflamed fibrous capsule with  hemosiderin-laden macrophages, in a background of abundant necrosis.  One of the cores demonstrates a minute fragment of viable epithelial  cells.  These cells are positive for p40.  They are negative for TTF1  and CK7. The cells of interest disappear on deeper cut levels.  While  the findings are suspicious for squamous cell carcinoma, there is not  enough viable tumor for a definitive diagnosis.  Additional tissue  sampling may be helpful.    # SEP 2022-cycle #2 Taxol hypersensitive reaction.  Switched over to #3 cycle- carbo-Abraxane starting 06/29/2021.  Discontinued IMFINZI therapy-December 2022 given poor tolerance/pneumonia admission to hospital.  #  [name] was in May-June 2023 x3 doses; stopped because of intolerance-fatigue patient preference/lack of progression.  # JAN 2024-bronchoscopy biopsy negative for malignancy; however PET scan concerning for recurrence/progression.    May 05, 2022-s/p bronchoscopy- with Dr.Aleskerov-significant purulent/necrotic debris noted. Dec 23rd, 2023- PET scan: The cavitary process in the right upper lobe is similar in size to previous CTS, although demonstrates prominent peripheral hypermetabolic activity. 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 Hypermetabolic activity within the adjacent upper right lateral chest wall with right 3rd through 5th rib deformities, suspicious for direct tumor spread. No evidence of distant osseous metastatic disease; Hypermetabolic left hilar lymph node worrisome for metastatic disease. No other hypermetabolic lymph nodes or distant metastases identified.  Bronchoscopy/EBUS [JAN 26th, 2024- Dr.A]- BAL/ 4R-lymph node FNA negative for malignancy.  Patient declined further invasive procedures.  # FEB 15t, 2024- RE-Start Keytruda.   # DEC 2022- s/p ID- Dr.ravishankar- ? Lung abscess-no antibiotics-  JAN 2023- CT scan incidental- cecal mass- colonoscopy[Dr.Russo; [facility]- high grade dysplasia]  S/p evaluation with Dr. Sherryle Lis surgery for now]   # S/p evaluation with Dr. Sheppard Penton.   Primary cancer of right upper lobe of lung (HCC)  06/03/2021 Initial Diagnosis   Primary cancer of right upper lobe of lung (HCC)   06/03/2021 Cancer Staging   Staging form: Lung, AJCC 8th Edition - Clinical: Stage IIIB (cT3, cN2, cM0) - Signed by  Louretta Shorten  R, MD on 06/03/2021   06/15/2021 - 08/03/2021 Chemotherapy   Patient is on Treatment Plan : LUNG Carboplatin / Paclitaxel + XRT q7d     10/02/2021 - 10/19/2021 Chemotherapy   Patient is on Treatment Plan : LUNG Durvalumab q14d     02/03/2022 -  Chemotherapy   Patient is on Treatment Plan : LUNG NSCLC Pembrolizumab (200) q21d     02/04/2022 - 03/18/2022 Chemotherapy   Patient is on Treatment Plan : LUNG NSCLC Pembrolizumab (200) q21d      HISTORY OF PRESENTING ILLNESS: Patient is ambulating independently.  Accompanied by wife.  Rodney Vega 67 y.o.  male history of smoking -"lung cancer" [Limited necrotic tissue]-locally advanced unresectable- currently on surveillance [APRIL 2024-Keytruda was discontinued because of poor tolerance/patient preference] ; and Cecal mass-high grade dysplasia intact-  is here for follow-up/a nd review the results of recent CT scan chest and pelvis.     Patient continues to have right chest wall pain which is quite chronic.  He is taking oxycodone for a day.  Continues to complain of intermittent constipation.  Is on daily stool softeners/MiraLAX.  Denies any worsening abdominal pain nausea vomiting.  As per the patient's wife patient fatigued.   No appetite to speak of; he takes small amounts/ bites. Denies nausea. Smokes weed.unfortunately continues to smoke about 1/2  pack cigarettes per day.   He continues to get IV fluids-twice a week-feels improved after IV infusions. No blood in stools black or stools.  Review of Systems  Constitutional:  Positive for malaise/fatigue and weight loss. Negative for chills, diaphoresis and fever.  HENT:  Negative for nosebleeds and sore throat.   Eyes:  Negative for double vision.  Respiratory:  Positive for cough, sputum production and shortness of breath. Negative for hemoptysis and wheezing.   Cardiovascular:  Positive for chest pain. Negative for palpitations, orthopnea and leg swelling.   Gastrointestinal:  Positive for nausea. Negative for abdominal pain, blood in stool, constipation, diarrhea, heartburn, melena and vomiting.  Genitourinary:  Negative for dysuria, frequency and urgency.  Musculoskeletal:  Negative for back pain and joint pain.  Skin: Negative.  Negative for itching and rash.  Neurological:  Positive for tingling. Negative for dizziness, focal weakness, weakness and headaches.  Endo/Heme/Allergies:  Does not bruise/bleed easily.  Psychiatric/Behavioral:  Negative for depression. The patient is not nervous/anxious and does not have insomnia.      MEDICAL HISTORY:  Past Medical History:  Diagnosis Date   Acute on chronic respiratory failure (HCC)    Anemia    Cancer (HCC)    Colostomy present (HCC)    COPD (chronic obstructive pulmonary disease) (HCC)    Degenerative arthritis of knee    Depression    Diabetes mellitus without complication (HCC)    Diverticulitis    Dyspnea    Glaucoma    since 2004   Hernia 1979   History of kidney stones    History of methicillin resistant staphylococcus aureus (MRSA)    Hypertension    Hypokalemia    Marijuana use    Mass of cecum    Nephrolithiasis    Neuromuscular disorder (HCC)    Personal history of tobacco use, presenting hazards to health    Pneumonia    Port-A-Cath in place    Pre-diabetes    Primary cancer of right upper lobe of lung (HCC)    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)    Screening for obesity    Severe sepsis (HCC)  Special screening for malignant neoplasms, colon    Tobacco use    Wears dentures    full upper and lower    SURGICAL HISTORY: Past Surgical History:  Procedure Laterality Date   BACK SURGERY  1994, 2012   lumbar bulging disc   BRONCHOSCOPY  05/05/2022   CATARACT EXTRACTION W/PHACO Left 01/20/2021   Procedure: CATARACT EXTRACTION PHACO AND INTRAOCULAR LENS PLACEMENT (IOC) LEFT;  Surgeon: Galen Manila, MD;  Location: [facility];  Service:  Ophthalmology;  Laterality: Left;  10.13 0:52.1   COLONOSCOPY  [id number]   [facility]/Dr. Carmell Austria sessile polyp,47mm, found in rectum,multiple diverticula were found in the sigmoid, in descending and in transverse colon   COLONOSCOPY WITH PROPOFOL N/A 10/22/2021   Procedure: COLONOSCOPY WITH PROPOFOL;  Surgeon: Jaynie Collins, DO;  Location: [facility];  Service: Gastroenterology;  Laterality: N/A;  DM   COLOSTOMY  04/20/2013   EYE SURGERY     HERNIA REPAIR  1979   inguinal   IR IMAGING GUIDED PORT INSERTION  06/10/2021   JOINT REPLACEMENT Right    knee   KNEE ARTHROPLASTY Right 05/08/2018   Procedure: COMPUTER ASSISTED TOTAL KNEE ARTHROPLASTY;  Surgeon: Donato Heinz, MD;  Location: [facility];  Service: Orthopedics;  Laterality: Right;   KNEE ARTHROPLASTY Left 11/16/2019   Procedure: COMPUTER ASSISTED TOTAL KNEE ARTHROPLASTY;  Surgeon: Donato Heinz, MD;  Location: [facility];  Service: Orthopedics;  Laterality: Left;   LAPAROTOMY  04/20/2013   colon resection with colosotmy   LITHOTRIPSY  2013   LUNG BIOPSY  2023   RIGID BRONCHOSCOPY N/A 10/29/2022   Procedure: RIGID BRONCHOSCOPY;  Surgeon: [name], MD;  Location: [facility];  Service: Thoracic;  Laterality: N/A;   TONSILLECTOMY AND ADENOIDECTOMY  1962   VIDEO BRONCHOSCOPY WITH ENDOBRONCHIAL NAVIGATION N/A 05/05/2022   Procedure: VIDEO BRONCHOSCOPY WITH ENDOBRONCHIAL NAVIGATION;  Surgeon: [name], MD;  Location: [facility];  Service: Thoracic;  Laterality: N/A;   VIDEO BRONCHOSCOPY WITH ENDOBRONCHIAL ULTRASOUND N/A 05/05/2022   Procedure: VIDEO BRONCHOSCOPY WITH ENDOBRONCHIAL ULTRASOUND;  Surgeon: [name], MD;  Location: [facility];  Service: Thoracic;  Laterality: N/A;   VIDEO BRONCHOSCOPY WITH ENDOBRONCHIAL ULTRASOUND N/A 10/29/2022   Procedure: VIDEO BRONCHOSCOPY WITH ENDOBRONCHIAL ULTRASOUND;  Surgeon: [name], MD;  Location: [facility];  Service: Thoracic;  Laterality: N/A;    SOCIAL HISTORY: Social  History   Socioeconomic History   Marital status: Married    Spouse name: Pegge   Number of children: Not on file   Years of education: Not on file   Highest education level: Not on file  Occupational History   Not on file  Tobacco Use   Smoking status: Every Day    Current packs/day: 1.00    Average packs/day: 1 pack/day for 30.0 years (30.0 ttl pk-yrs)    Types: Cigarettes   Smokeless tobacco: Never   Tobacco comments:    since age 57 or 68  Vaping Use   Vaping status: Never Used  Substance and Sexual Activity   Alcohol use: Yes    Alcohol/week: 2.0 standard drinks of alcohol    Types: 2 Standard drinks or equivalent per week    Comment: rare   Drug use: Yes    Types: Marijuana    Comment: fentanyl patch prescribed & oxycodone   Sexual activity: Not on file  Other Topics Concern   Not on file  Social History Narrative   [place] /[place]; smoking; not much alcohol. Worked for [employer],  2019. .    Social Determinants of Health   Financial Resource Strain: Low Risk  (08/10/2022)   Overall Financial Resource Strain (CARDIA)    Difficulty of Paying Living Expenses: Not very hard  Food Insecurity: No Food Insecurity (01/20/2023)   Hunger Vital Sign    Worried About Running Out of Food in the Last Year: Never true    Ran Out of Food in the Last Year: Never true  Transportation Needs: No Transportation Needs (01/20/2023)   PRAPARE - Administrator, Civil Service (Medical): No    Lack of Transportation (Non-Medical): No  Physical Activity: Not on file  Stress: No Stress Concern Present (08/10/2022)   [employer] of Occupational Health - Occupational Stress Questionnaire    Feeling of Stress : Not at all  Social Connections: Unknown (08/10/2022)   Social Connection and Isolation Panel [NHANES]    Frequency of Communication with Friends and Family: Three times a week    Frequency of Social Gatherings with Friends and Family: Twice a week     Attends Religious Services: Patient declined    Database administrator or Organizations: No    Attends Banker Meetings: Never    Marital Status: Married  Catering manager Violence: Not At Risk (01/20/2023)   Humiliation, Afraid, Rape, and Kick questionnaire    Fear of Current or Ex-Partner: No    Emotionally Abused: No    Physically Abused: No    Sexually Abused: No    FAMILY HISTORY: Family History  Problem Relation Age of Onset   Diabetes Mother    Heart disease Mother    Heart attack Father    Prostate cancer Father     ALLERGIES:  is allergic to paclitaxel, ambien [zolpidem], nsaids, and aleve [naproxen sodium].  MEDICATIONS:  Current Outpatient Medications  Medication Sig Dispense Refill   Accu-Chek Softclix Lancets lancets USE AS DIRECTED FOUR TIMES DAILY 100 each 12   acetaminophen (TYLENOL) 500 MG tablet Take 2 tablets (1,000 mg total) by mouth daily as needed. Home med. 30 tablet 0   albuterol (PROVENTIL) (2.5 MG/3ML) 0.083% nebulizer solution Take 2.5 mg by nebulization every 4 (four) hours as needed for wheezing or shortness of breath.     albuterol (VENTOLIN HFA) 108 (90 Base) MCG/ACT inhaler SMARTSIG:2 inhalation Via Inhaler Every 6 Hours PRN 1 each 2   blood glucose meter kit and supplies KIT Check your blood glucose levels twice a day; once in the morning before breakfast; and once in the evening after dinner. 1 each 0   BREZTRI AEROSPHERE 160-9-4.8 MCG/ACT AERO Inhale 2 puffs into the lungs 2 (two) times daily.     dextromethorphan-guaiFENesin (MUCINEX DM) 30-600 MG 12hr tablet Take 1 tablet by mouth 2 (two) times daily as needed for cough.     dorzolamide-timolol (COSOPT) 22.3-6.8 MG/ML ophthalmic solution Place 1 drop into both eyes 2 (two) times daily.     eszopiclone (LUNESTA) 1 MG TABS tablet TAKE 1 TABLET(1 MG) BY MOUTH AT BEDTIME AS NEEDED FOR SLEEP 30 tablet 3   fentaNYL (DURAGESIC) 25 MCG/HR Place 1 patch onto the skin every 3 (three) days.  10 patch 0   glucose blood test strip Check your blood glucose levels twice a day; once in the morning before breakfast; and once in the evening after dinner. 100 each 12   hydrOXYzine (ATARAX) 10 MG tablet Take 1 tablet (10 mg total) by mouth at bedtime as needed for itching. 30 tablet 1  ipratropium-albuterol (DUONEB) 0.5-2.5 (3) MG/3ML SOLN Take 3 mLs by nebulization every 6 (six) hours as needed. 360 mL 1   latanoprost (XALATAN) 0.005 % ophthalmic solution Place 1 drop into both eyes at bedtime.     lidocaine-prilocaine (EMLA) cream Apply 1 Application topically as needed. 30 g 3   montelukast (SINGULAIR) 10 MG tablet TAKE 1 TABLET(10 MG) BY MOUTH AT BEDTIME 90 tablet 0   Multiple Vitamin (MULTIVITAMIN WITH MINERALS) TABS tablet Take 1 tablet by mouth daily.     oxyCODONE (OXY IR/ROXICODONE) 5 MG immediate release tablet Take 1 tablet (5 mg total) by mouth every 6 (six) hours as needed for severe pain. 90 tablet 0   pregabalin (LYRICA) 50 MG capsule Take 1 capsule (50 mg total) by mouth 2 (two) times daily. 60 capsule 2   propranolol ER (INDERAL LA) 60 MG 24 hr capsule Take 1 capsule (60 mg total) by mouth daily. 30 capsule 3   Respiratory Therapy Supplies (NEBULIZER) DEVI Use as directed 1 each 0   tamsulosin (FLOMAX) 0.4 MG CAPS capsule Take 0.4 mg by mouth daily after supper.     triamcinolone ointment (KENALOG) 0.5 % Apply 1 Application topically 2 (two) times daily. 30 g 1   venlafaxine XR (EFFEXOR-XR) 75 MG 24 hr capsule Take 225 mg by mouth daily with breakfast.     glipiZIDE (GLUCOTROL) 5 MG tablet TAKE 1 TABLET(5 MG) BY MOUTH DAILY BEFORE BREAKFAST (Patient not taking: Reported on 05/17/2023) 90 tablet 2   metFORMIN (GLUCOPHAGE) 500 MG tablet Take 1 tablet (500 mg total) by mouth 2 (two) times daily with a meal. (Patient not taking: Reported on 05/17/2023) 60 tablet 2   naloxone (NARCAN) nasal spray 4 mg/0.1 mL Place 1 spray into the nose once. (Patient not taking: Reported on 05/17/2023)      olmesartan (BENICAR) 40 MG tablet Take 1 tablet (40 mg total) by mouth every morning. (Patient not taking: Reported on 02/22/2023) 30 tablet 3   prochlorperazine (COMPAZINE) 10 MG tablet TAKE 1 TABLET(10 MG) BY MOUTH EVERY 6 HOURS AS NEEDED FOR NAUSEA OR VOMITING (Patient not taking: Reported on 05/17/2023) 30 tablet 1   vitamin B-12 (CYANOCOBALAMIN) 500 MCG tablet Take 1 tablet by mouth daily. (Patient not taking: Reported on 05/17/2023)     No current facility-administered medications for this visit.   Facility-Administered Medications Ordered in Other Visits  Medication Dose Route Frequency Provider Last Rate Last Admin   heparin lock flush 100 UNIT/ML injection            heparin lock flush 100 UNIT/ML injection            heparin lock flush 100 UNIT/ML injection            heparin lock flush 100 unit/mL  500 Units Intravenous Once Louretta Shorten R, MD       heparin lock flush 100 unit/mL  500 Units Intracatheter Once PRN Louretta Shorten R, MD       potassium chloride 20 mEq in 100 mL IVPB  20 mEq Intravenous Once Louretta Shorten R, MD 100 mL/hr at 06/29/23 1144 20 mEq at 06/29/23 1144   sodium chloride flush (NS) 0.9 % injection 10 mL  10 mL Intracatheter Once PRN [name], MD        PHYSICAL EXAMINATION: ECOG PERFORMANCE STATUS: 1 - Symptomatic but completely ambulatory  Vitals:   06/29/23 1006  BP: 119/75  Pulse: 82  Temp: 98.6 F (37 C)  SpO2: 96%  Filed Weights   06/29/23 1006  Weight: 159 lb (72.1 kg)     Physical Exam Vitals and nursing note reviewed.  HENT:     Head: Normocephalic and atraumatic.     Mouth/Throat:     Pharynx: Oropharynx is clear.  Eyes:     Extraocular Movements: Extraocular movements intact.     Pupils: Pupils are equal, round, and reactive to light.  Cardiovascular:     Rate and Rhythm: Normal rate and regular rhythm.  Pulmonary:     Comments: Decreased breath sounds bilaterally.  Abdominal:     Palpations:  Abdomen is soft.  Musculoskeletal:        General: Normal range of motion.     Cervical back: Normal range of motion.  Skin:    General: Skin is warm.  Neurological:     General: No focal deficit present.     Mental Status: He is alert and oriented to person, place, and time.  Psychiatric:        Behavior: Behavior normal.        Judgment: Judgment normal.    LABORATORY DATA:  I have reviewed the data as listed Lab Results  Component Value Date   WBC 6.7 06/29/2023   HGB 11.8 (L) 06/29/2023   HCT 36.2 (L) 06/29/2023   MCV 88.5 06/29/2023   PLT 219 06/29/2023   Recent Labs    05/17/23 0859 06/14/23 0929 06/29/23 1008  NA 137 138 136  K 3.6 3.4* 3.3*  CL 103 104 103  CO2 26 25 27   GLUCOSE 118* 181* 188*  BUN 10 10 9   CREATININE 1.07 1.10 1.17  CALCIUM 8.6* 8.7* 8.7*  GFRNONAA >60 >60 >60  PROT 7.0 7.2 6.9  ALBUMIN 3.3* 3.5 3.4*  AST 10* 15 12*  ALT 7 7 7   ALKPHOS 98 91 92  BILITOT 0.5 0.3 0.3    RADIOGRAPHIC STUDIES: I have personally reviewed the radiological images as listed and agreed with the findings in the report. CT CHEST ABDOMEN PELVIS W CONTRAST  Result Date: 06/24/2023 CLINICAL DATA:  Left upper lobe unresectable lung cancer. Cecal mass. * Tracking Code: BO * right-sided chest pain. EXAM: CT CHEST, ABDOMEN, AND PELVIS WITH CONTRAST TECHNIQUE: Multidetector CT imaging of the chest, abdomen and pelvis was performed following the standard protocol during bolus administration of intravenous contrast. RADIATION DOSE REDUCTION: This exam was performed according to the departmental dose-optimization program which includes automated exposure control, adjustment of the mA and/or kV according to patient size and/or use of iterative reconstruction technique. CONTRAST:  OMNIPAQUE IOHEXOL 300 MG/ML  SOLN COMPARISON:  04/13/2023 chest CT. 01/14/2023 chest abdomen and pelvic CTs. FINDINGS: CT CHEST FINDINGS Cardiovascular: Left Port-A-Cath tip low SVC. Aortic  atherosclerosis. Normal heart size, without pericardial effusion. Multivessel coronary artery atherosclerosis. No central pulmonary embolism, on this non-dedicated study. Mediastinum/Nodes: No supraclavicular adenopathy. Similar prominent but not pathologically sized middle mediastinal nodes. Left hilar adenopathy at 1.9 x 1.8 cm on 28/2 is similar. Mediastinal deviation right. Lungs/Pleura: No pleural fluid. 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 Difficult to quantify secondary to infiltrative morphology. Estimated at 9.3 x 6.6 cm on 22/2 versus 10.1 by 6.8 cm on the prior exam when remeasured in a similar fashion. This suggests stability. Peribronchovascular ill-defined micronodularity is greater right than left and similar. Moderate underlying centrilobular and paraseptal emphysema. The presumed atelectasis in the left lower lobe on the prior exam has resolved.  Musculoskeletal: Similar chest wall invasion with direct involvement of the third through sixth ribs. Pathologic fractures of the fourth through sixth right ribs again identified. Similar T2 superior endplate compression deformity. CT ABDOMEN PELVIS FINDINGS Hepatobiliary: Normal liver. Normal gallbladder, without biliary ductal dilatation. Pancreas: Normal, without mass or ductal dilatation. Spleen: Normal in size, without focal abnormality. Adrenals/Urinary Tract: Mild left adrenal thickening and nodularity are unchanged. 3.0 cm right adrenal nodule was similar in size on the 09/22/2022 PET and measured 6 HU on that exam. Normal right kidney. Multiple small stones within the lower pole left renal collecting system. Mild left-sided hydronephrosis without hydroureter, chronic. Normal urinary bladder. Stomach/Bowel: Normal stomach, without wall thickening. Hartmann's pouch. Descending colostomy. Colonic stool burden suggests constipation. Posterior cecal mass of 3.5 cm on 99/2 is  grossly similar to 01/14/2023. This again causes obstruction of the appendiceal orifice with resultant appendiceal dilatation to 12 mm. No surrounding inflammation. No small bowel obstruction. Vascular/Lymphatic: Aortic atherosclerosis. No abdominopelvic adenopathy. Reproductive: Normal prostate. Other: No significant free fluid. No evidence of omental or peritoneal disease. Musculoskeletal: Mild degenerative changes of the right hip. Presumed bone island within the right iliac at 5 mm. IMPRESSION: CT CHEST IMPRESSION 1. Relatively similar infiltrative right upper lobe lung mass with direct chest wall invasion and secondary bronchopulmonary fistula. 2. Similar left hilar adenopathy. 3. No change in peribronchovascular ill-defined micronodularity which could represent chronic atypical infection or endobronchial spread of tumor. 4. Aortic atherosclerosis (ICD10-I70.0), coronary artery atherosclerosis and emphysema (ICD10-J43.9). CT ABDOMEN AND PELVIS IMPRESSION 1. Similar cecal mass causing obstruction of the appendiceal orifice. No bowel obstruction or secondary appendicitis. 2. No abdominopelvic metastatic disease. 3. Left nephrolithiasis with chronic ureteropelvic junction obstruction pattern. 4. Right adrenal adenoma . In the absence of clinically indicated signs/symptoms require(s) no independent follow-up. 5.  Possible constipation. Electronically Signed   By: Jeronimo Greaves M.D.   On: 06/24/2023 12:31    ASSESSMENT & PLAN:   Primary cancer of right upper lobe of lung (HCC) # UNRESECTABLE/LOCALLY ADVANCED- Right upper lobe lung mass -concerning for malignancy [biopsy inconclusive/atypical cells]-  April 2023- PD-L1 positive [cirucologene].   Currently OFF therapy/on surveillance [given poor tolerance to Keytruda]. CT SEP 16th, 2024- . Relatively similar infiltrative right upper lobe lung mass with direct chest wall invasion and secondary bronchopulmonary fistula;  Similar left hilar adenopathy;  No change in  peribronchovascular ill-defined micronodularity which could represent chronic atypical infection or endobronchial spread of tumor.   # continue surveillance off therap [since apri 2024- sec to poor tolerance] on a clinical basis.  Patient however wants to continue symptomatic management with IV fluids; supportive care as needed- but slow clinical progression noted. Stable.   # Anemia- mild to moderate- feb 2024- iron sat- 17%; ferritin- 61-  Stable.    #Right chest wall pain-/pleuritic-rib destruction malignancy versus radiation- ADDED back [march 2024; [name]]-fenatny patch 25 mcg- Continue Oxycodone 5 mg every 8  hours; worse- will increase to q 6 hours prn.   # COPD/fatigue- [coughing/phlegm/ no fevers]- on albuterol/advair [smoking]- TSH- WNL;Continue  Xopenex/ipratropium nebs-; Mucinex- DM BID Stable.   # Hypokalemia-3.3 - continue potassium 20 mEq liquid.-Hypocalemia: 8.4/albumin- 3.2. MARCH 2024-vit D 105- HOLD off -Vit D 50,K. Stable.   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 Lemar Livings [[facility]] CT  SEP 2024- mass lesion identified along the cecum obstructing the orifice of the appendix. Currently  surveillance given patient's  multiple comorbidities/reluctance with any surgical options-  stable; discussed with Dr.Cintron.   # Labile Blood glucose- BG as low as 25; taken off glipizide-  Metformin [PCP]- stable.   # IV mediport: functioning/stable.   # Insomnia-continue Lunesta refilled- stable.   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  .I reviewed/discussed/counseled the patient.   *appt thru mychart   # DISPOSITION: # IVFs over 1 hour.  KCL 20 IV.   # in  1 week- IVFs-1 lit /1 hourTuesdays/Thursday;     # in 2 weeks- lab-  IVFs-1 lit /1 hourTuesdays/Thursday   # # 3 weeks-  IVFs-1 lit /1 hourTuesdays/Thursday  # follow up in 4 week Tuesday- MD; labs- cbc/cmp; TSH;  IVFs-1 lit /1 hour;KCL 20 IV; - Thursday IVFs-1 lit /1 hour - Dr.B  # I reviewed the blood work- with the patient in detail; also reviewed the imaging independently [as summarized above]; and with the patient in detail.        All questions were answered. The patient knows to call the clinic with any problems, questions or concerns.    [name], MD 06/29/2023 12:18 PM

## 2023-07-01 ENCOUNTER — Inpatient Hospital Stay: Payer: Medicare PPO

## 2023-07-01 VITALS — BP 105/67 | HR 83 | Temp 97.0°F | Resp 20 | Wt 161.4 lb

## 2023-07-01 DIAGNOSIS — C3411 Malignant neoplasm of upper lobe, right bronchus or lung: Secondary | ICD-10-CM

## 2023-07-01 MED ORDER — SODIUM CHLORIDE 0.9% FLUSH
10.0000 mL | Freq: Once | INTRAVENOUS | Status: AC | PRN
  Administered 2023-07-01: 10 mL
  Filled 2023-07-01: qty 10

## 2023-07-01 MED ORDER — HEPARIN SOD (PORK) LOCK FLUSH 100 UNIT/ML IV SOLN
500.0000 [IU] | Freq: Once | INTRAVENOUS | Status: AC | PRN
  Administered 2023-07-01: 500 [IU]
  Filled 2023-07-01: qty 5

## 2023-07-01 MED ORDER — SODIUM CHLORIDE 0.9 % IV SOLN
Freq: Once | INTRAVENOUS | Status: AC
  Filled 2023-07-01: qty 250

## 2023-07-05 ENCOUNTER — Inpatient Hospital Stay: Payer: Medicare PPO | Attending: Internal Medicine

## 2023-07-05 VITALS — BP 117/76 | HR 83 | Resp 20 | Wt 159.0 lb

## 2023-07-05 DIAGNOSIS — C3411 Malignant neoplasm of upper lobe, right bronchus or lung: Secondary | ICD-10-CM | POA: Diagnosis present

## 2023-07-05 DIAGNOSIS — Z8614 Personal history of Methicillin resistant Staphylococcus aureus infection: Secondary | ICD-10-CM | POA: Insufficient documentation

## 2023-07-05 DIAGNOSIS — E119 Type 2 diabetes mellitus without complications: Secondary | ICD-10-CM | POA: Insufficient documentation

## 2023-07-05 DIAGNOSIS — R630 Anorexia: Secondary | ICD-10-CM | POA: Diagnosis not present

## 2023-07-05 DIAGNOSIS — J449 Chronic obstructive pulmonary disease, unspecified: Secondary | ICD-10-CM | POA: Diagnosis not present

## 2023-07-05 DIAGNOSIS — F17210 Nicotine dependence, cigarettes, uncomplicated: Secondary | ICD-10-CM | POA: Diagnosis not present

## 2023-07-05 DIAGNOSIS — I10 Essential (primary) hypertension: Secondary | ICD-10-CM | POA: Diagnosis not present

## 2023-07-05 DIAGNOSIS — Z8042 Family history of malignant neoplasm of prostate: Secondary | ICD-10-CM | POA: Insufficient documentation

## 2023-07-05 DIAGNOSIS — K5900 Constipation, unspecified: Secondary | ICD-10-CM | POA: Diagnosis not present

## 2023-07-05 DIAGNOSIS — E876 Hypokalemia: Secondary | ICD-10-CM | POA: Insufficient documentation

## 2023-07-05 DIAGNOSIS — Z7984 Long term (current) use of oral hypoglycemic drugs: Secondary | ICD-10-CM | POA: Insufficient documentation

## 2023-07-05 DIAGNOSIS — R0789 Other chest pain: Secondary | ICD-10-CM | POA: Diagnosis not present

## 2023-07-05 DIAGNOSIS — J9620 Acute and chronic respiratory failure, unspecified whether with hypoxia or hypercapnia: Secondary | ICD-10-CM | POA: Diagnosis not present

## 2023-07-05 DIAGNOSIS — Z79899 Other long term (current) drug therapy: Secondary | ICD-10-CM | POA: Diagnosis not present

## 2023-07-05 MED ORDER — HEPARIN SOD (PORK) LOCK FLUSH 100 UNIT/ML IV SOLN
500.0000 [IU] | Freq: Once | INTRAVENOUS | Status: AC | PRN
  Administered 2023-07-05: 500 [IU]
  Filled 2023-07-05: qty 5

## 2023-07-05 MED ORDER — SODIUM CHLORIDE 0.9 % IV SOLN
Freq: Once | INTRAVENOUS | Status: AC
  Filled 2023-07-05: qty 250

## 2023-07-05 MED ORDER — SODIUM CHLORIDE 0.9% FLUSH
10.0000 mL | Freq: Once | INTRAVENOUS | Status: AC | PRN
  Administered 2023-07-05: 10 mL
  Filled 2023-07-05: qty 10

## 2023-07-07 ENCOUNTER — Inpatient Hospital Stay: Payer: Medicare PPO

## 2023-07-07 VITALS — BP 131/80 | HR 85 | Temp 97.3°F | Wt 159.0 lb

## 2023-07-07 DIAGNOSIS — C3411 Malignant neoplasm of upper lobe, right bronchus or lung: Secondary | ICD-10-CM

## 2023-07-07 MED ORDER — SODIUM CHLORIDE 0.9% FLUSH
10.0000 mL | Freq: Once | INTRAVENOUS | Status: AC | PRN
  Administered 2023-07-07: 10 mL
  Filled 2023-07-07: qty 10

## 2023-07-07 MED ORDER — HEPARIN SOD (PORK) LOCK FLUSH 100 UNIT/ML IV SOLN
500.0000 [IU] | Freq: Once | INTRAVENOUS | Status: AC | PRN
  Administered 2023-07-07: 500 [IU]
  Filled 2023-07-07: qty 5

## 2023-07-07 MED ORDER — SODIUM CHLORIDE 0.9 % IV SOLN
Freq: Once | INTRAVENOUS | Status: AC
  Filled 2023-07-07: qty 250

## 2023-07-12 ENCOUNTER — Inpatient Hospital Stay: Payer: Medicare PPO

## 2023-07-12 DIAGNOSIS — C3411 Malignant neoplasm of upper lobe, right bronchus or lung: Secondary | ICD-10-CM | POA: Diagnosis not present

## 2023-07-12 LAB — CMP (CANCER CENTER ONLY)
ALT: 7 U/L (ref 0–44)
AST: 15 U/L (ref 15–41)
Albumin: 3.5 g/dL (ref 3.5–5.0)
Alkaline Phosphatase: 101 U/L (ref 38–126)
Anion gap: 10 (ref 5–15)
BUN: 12 mg/dL (ref 8–23)
CO2: 25 mmol/L (ref 22–32)
Calcium: 8.6 mg/dL — ABNORMAL LOW (ref 8.9–10.3)
Chloride: 101 mmol/L (ref 98–111)
Creatinine: 1.23 mg/dL (ref 0.61–1.24)
GFR, Estimated: >60 mL/min (ref >60)
Glucose, Bld: 224 mg/dL — ABNORMAL HIGH (ref 70–99)
Potassium: 3.2 mmol/L — ABNORMAL LOW (ref 3.5–5.1)
Sodium: 136 mmol/L (ref 135–145)
Total Bilirubin: 0.3 mg/dL (ref 0.3–1.2)
Total Protein: 6.8 g/dL (ref 6.5–8.1)

## 2023-07-12 LAB — CBC WITH DIFFERENTIAL (CANCER CENTER ONLY)
Abs Immature Granulocytes: 0.02 10*3/uL (ref 0.00–0.07)
Basophils Absolute: 0.1 10*3/uL (ref 0.0–0.1)
Basophils Relative: 1 %
Eosinophils Absolute: 0.5 10*3/uL (ref 0.0–0.5)
Eosinophils Relative: 7 %
HCT: 35.3 % — ABNORMAL LOW (ref 39.0–52.0)
Hemoglobin: 11.5 g/dL — ABNORMAL LOW (ref 13.0–17.0)
Immature Granulocytes: 0 %
Lymphocytes Relative: 13 %
Lymphs Abs: 0.9 10*3/uL (ref 0.7–4.0)
MCH: 28.5 pg (ref 26.0–34.0)
MCHC: 32.6 g/dL (ref 30.0–36.0)
MCV: 87.6 fL (ref 80.0–100.0)
Monocytes Absolute: 0.5 10*3/uL (ref 0.1–1.0)
Monocytes Relative: 7 %
Neutro Abs: 4.8 10*3/uL (ref 1.7–7.7)
Neutrophils Relative %: 72 %
Platelet Count: 180 10*3/uL (ref 150–400)
RBC: 4.03 MIL/uL — ABNORMAL LOW (ref 4.22–5.81)
RDW: 13.2 % (ref 11.5–15.5)
WBC Count: 6.8 10*3/uL (ref 4.0–10.5)
nRBC: 0 % (ref 0.0–0.2)

## 2023-07-12 LAB — TSH: TSH: 3.574 u[IU]/mL (ref 0.350–4.500)

## 2023-07-12 MED ORDER — HEPARIN SOD (PORK) LOCK FLUSH 100 UNIT/ML IV SOLN
500.0000 [IU] | Freq: Once | INTRAVENOUS | Status: AC
  Administered 2023-07-12: 500 [IU] via INTRAVENOUS
  Filled 2023-07-12: qty 5

## 2023-07-12 MED ORDER — SODIUM CHLORIDE 0.9% FLUSH
10.0000 mL | Freq: Once | INTRAVENOUS | Status: AC
  Administered 2023-07-12: 10 mL via INTRAVENOUS
  Filled 2023-07-12: qty 10

## 2023-07-12 NOTE — Progress Notes (Signed)
Pt here for IVF today. Per Dr Donneta Romberg no IVf today.. oral potassium prescribed. Pt encouraged to drink fluids at home and take potassium supplement as prescribed. Further IVF appointments discontinued at this time

## 2023-07-13 ENCOUNTER — Other Ambulatory Visit: Payer: Self-pay | Admitting: *Deleted

## 2023-07-13 MED ORDER — POTASSIUM CHLORIDE CRYS ER 20 MEQ PO TBCR: 20.0000 meq | EXTENDED_RELEASE_TABLET | Freq: Every day | ORAL | 1 refills | Status: DC

## 2023-07-14 ENCOUNTER — Inpatient Hospital Stay: Payer: Medicare PPO

## 2023-07-19 ENCOUNTER — Other Ambulatory Visit: Payer: Self-pay | Admitting: *Deleted

## 2023-07-19 ENCOUNTER — Inpatient Hospital Stay: Payer: Medicare PPO

## 2023-07-19 DIAGNOSIS — C3411 Malignant neoplasm of upper lobe, right bronchus or lung: Secondary | ICD-10-CM

## 2023-07-21 ENCOUNTER — Inpatient Hospital Stay: Payer: Medicare PPO

## 2023-07-26 ENCOUNTER — Inpatient Hospital Stay (HOSPITAL_BASED_OUTPATIENT_CLINIC_OR_DEPARTMENT_OTHER): Payer: Medicare PPO | Admitting: Internal Medicine

## 2023-07-26 ENCOUNTER — Inpatient Hospital Stay: Payer: Medicare PPO

## 2023-07-26 ENCOUNTER — Encounter: Payer: Self-pay | Admitting: Internal Medicine

## 2023-07-26 DIAGNOSIS — C3411 Malignant neoplasm of upper lobe, right bronchus or lung: Secondary | ICD-10-CM

## 2023-07-26 LAB — CBC WITH DIFFERENTIAL (CANCER CENTER ONLY)
Abs Immature Granulocytes: 0.03 10*3/uL (ref 0.00–0.07)
Basophils Absolute: 0.1 10*3/uL (ref 0.0–0.1)
Basophils Relative: 1 %
Eosinophils Absolute: 0.4 10*3/uL (ref 0.0–0.5)
Eosinophils Relative: 5 %
HCT: 36.7 % — ABNORMAL LOW (ref 39.0–52.0)
Hemoglobin: 12.1 g/dL — ABNORMAL LOW (ref 13.0–17.0)
Immature Granulocytes: 0 %
Lymphocytes Relative: 12 %
Lymphs Abs: 1 10*3/uL (ref 0.7–4.0)
MCH: 28.5 pg (ref 26.0–34.0)
MCHC: 33 g/dL (ref 30.0–36.0)
MCV: 86.4 fL (ref 80.0–100.0)
Monocytes Absolute: 0.5 10*3/uL (ref 0.1–1.0)
Monocytes Relative: 6 %
Neutro Abs: 5.9 10*3/uL (ref 1.7–7.7)
Neutrophils Relative %: 76 %
Platelet Count: 215 10*3/uL (ref 150–400)
RBC: 4.25 MIL/uL (ref 4.22–5.81)
RDW: 13.3 % (ref 11.5–15.5)
WBC Count: 7.9 10*3/uL (ref 4.0–10.5)
nRBC: 0 % (ref 0.0–0.2)

## 2023-07-26 LAB — CMP (CANCER CENTER ONLY)
ALT: 7 U/L (ref 0–44)
AST: 18 U/L (ref 15–41)
Albumin: 3.5 g/dL (ref 3.5–5.0)
Alkaline Phosphatase: 107 U/L (ref 38–126)
Anion gap: 10 (ref 5–15)
BUN: 9 mg/dL (ref 8–23)
CO2: 26 mmol/L (ref 22–32)
Calcium: 8.9 mg/dL (ref 8.9–10.3)
Chloride: 101 mmol/L (ref 98–111)
Creatinine: 1.2 mg/dL (ref 0.61–1.24)
GFR, Estimated: >60 mL/min (ref >60)
Glucose, Bld: 227 mg/dL — ABNORMAL HIGH (ref 70–99)
Potassium: 3.4 mmol/L — ABNORMAL LOW (ref 3.5–5.1)
Sodium: 137 mmol/L (ref 135–145)
Total Bilirubin: 0.4 mg/dL (ref 0.3–1.2)
Total Protein: 7.2 g/dL (ref 6.5–8.1)

## 2023-07-26 LAB — TSH: TSH: 3.518 u[IU]/mL (ref 0.350–4.500)

## 2023-07-26 MED ORDER — FENTANYL 25 MCG/HR TD PT72: 1.0000 | MEDICATED_PATCH | TRANSDERMAL | 0 refills | Status: DC

## 2023-07-26 MED ORDER — ESZOPICLONE 1 MG PO TABS: ORAL_TABLET | ORAL | 3 refills | Status: DC

## 2023-07-26 MED ORDER — HEPARIN SOD (PORK) LOCK FLUSH 100 UNIT/ML IV SOLN
500.0000 [IU] | Freq: Once | INTRAVENOUS | Status: AC
  Administered 2023-07-26: 500 [IU] via INTRAVENOUS
  Filled 2023-07-26: qty 5

## 2023-07-26 MED ORDER — OXYCODONE HCL 5 MG PO TABS: 5.0000 mg | ORAL_TABLET | Freq: Four times a day (QID) | ORAL | 0 refills | Status: DC | PRN

## 2023-07-26 MED ORDER — HYDROXYZINE HCL 10 MG PO TABS: 10.0000 mg | ORAL_TABLET | Freq: Every evening | ORAL | 1 refills | Status: DC | PRN

## 2023-07-26 MED ORDER — PREDNISONE 20 MG PO TABS: 20.0000 mg | ORAL_TABLET | Freq: Every day | ORAL | 0 refills | Status: DC

## 2023-07-26 MED ORDER — SODIUM CHLORIDE 0.9% FLUSH
10.0000 mL | Freq: Once | INTRAVENOUS | Status: AC
  Administered 2023-07-26: 10 mL via INTRAVENOUS
  Filled 2023-07-26: qty 10

## 2023-07-26 NOTE — Assessment & Plan Note (Addendum)
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   Currently OFF therapy/on surveillance [given poor tolerance to Keytruda]. CT SEP 16th, 2024- . Relatively similar infiltrative right upper lobe lung mass with direct chest wall invasion and secondary bronchopulmonary fistula;  Similar left hilar adenopathy;  No change in peribronchovascular ill-defined micronodularity which could represent chronic atypical infection or endobronchial spread of tumor.   # continue surveillance off therap [since april 2024- sec to poor tolerance] on a clinical basis. Discussed re: slow clinical progression noted; again reviewed the inability to tolerate the chemo/immunotherapy.  Clinically stable.  Will repeat the scan again in 2 months.  Will order at next visit.  # Anemia- mild to moderate- feb 2024- iron sat- 17%; ferritin- 61-  Stable.    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   # COPD/fatigue- [coughing/phlegm/ no fevers]- on albuterol/advair [smoking]- TSH- WNL;Continue  Xopenex/ipratropium nebs-; Mucinex- DM BID-slightly worse.  Recommend prednisone.  # Hypokalemia-3.4 - continue potassium 20 mEq liquid.-Hypocalemia: 8.4/albumin- 3.2. MARCH 2024-vit D 105- HOLD off -Vit D 50,K. Stable.   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 Lemar Livings [Dr[facility]] CT  SEP 2024- mass lesion identified along the cecum obstructing the orifice of the appendix.  Discussed with Dr. Maia Plan -risk of appendiceal perforation if progression noted.  Discussed with the patient and wife -not too keen on any surgical options at this time given overall  comorbidities.  # Labile Blood glucose- BG as low as 25; taken off glipizide-  Metformin [PCP]- 228-stable.  Monitor closely on steroids prednisone 20 mg a day.  # Ongoing fatigue: Poor appetite-recommend trial of prednisone 20 mg a day.   # IV mediport: functioning/stable.   # Insomnia-continue Lunesta refilled- stable.    *appt thru mychart   # DISPOSITION: # De-access-NO IVFs or KCL # follow up in 4 week Tuesday- MD; labs- cbc/cmp; TSH; possible  IVFs-1 lit /1 hour;KCL 20 IV-  Dr.B

## 2023-07-26 NOTE — Progress Notes (Signed)
Appetite 25%, no supplements.  Sleeping more, weak. Can he get fluids today?  Pt states he needed O2 most of the day, level was going down to 80's and then come back up. O2, 2L. Needed 4L at one point yesterday.

## 2023-07-26 NOTE — Addendum Note (Signed)
Addended by: Clydia Llano on: 07/26/2023 10:55 AM   Modules accepted: Orders

## 2023-07-26 NOTE — Progress Notes (Signed)
[facility] CONSULT NOTE  Patient Care Team: [name], MD as PCP - General (Family Medicine) [name], RN as Oncology Nurse Navigator [name], [name], MD as Consulting Physician (Urology) [name], MD as Consulting Physician (Oncology) [name], MD as Consulting Physician (Pulmonary Disease)  CHIEF COMPLAINTS/PURPOSE OF CONSULTATION: lung cancer    Oncology History Overview Note  AUG 2022- 8.2 x 5.9 cm spiculated mass noted in right upper lobe consistent with malignancy. It extends from the right hilum to the lateral chest wall and results in lytic destruction of the lateral portion of the right fourth rib.  Mediastinal lymph nodes are noted, including 12 mm right hilar lymph node, consistent with metastatic disease. 3 cm right adrenal mass is noted concerning for metastatic disease.  # AUG 2022- PET IMPRESSION: Peripherally hypermetabolic right upper lobe mass, likely due to central necrosis. Mass extends into the right hilum and right lateral chest wall involving the second through fourth right ribs.   Mildly enlarged and hypermetabolic right hilar lymph node.   No evidence of metastatic disease in the abdomen or pelvis.  # AUG, 2022-RIGHT CHEST WALL Bx-necrosis; suspicious for malignancy not definitive [discussed with Dr.Kraynie;MDT] LUNG MASS, RIGHT; BIOPSY:  - SUSPICIOUS FOR MALIGNANCY.  - SEE COMMENT.   Comment:  Approximately six tissue cores demonstrate inflamed fibrous capsule with  hemosiderin-laden macrophages, in a background of abundant necrosis.  One of the cores demonstrates a minute fragment of viable epithelial  cells.  These cells are positive for p40.  They are negative for TTF1  and CK7. The cells of interest disappear on deeper cut levels.  While  the findings are suspicious for squamous cell carcinoma, there is not  enough viable tumor for a definitive diagnosis.  Additional tissue  sampling may be helpful.    # SEP 2022-cycle #2 Taxol hypersensitive reaction.  Switched over to #3 cycle- carbo-Abraxane starting 06/29/2021.  Discontinued IMFINZI therapy-December 2022 given poor tolerance/pneumonia admission to hospital.  #  [name] was in May-June 2023 x3 doses; stopped because of intolerance-fatigue patient preference/lack of progression.  # JAN 2024-bronchoscopy biopsy negative for malignancy; however PET scan concerning for recurrence/progression.    May 05, 2022-s/p bronchoscopy- with Dr.Aleskerov-significant purulent/necrotic debris noted. Dec 23rd, 2023- PET scan: The cavitary process in the right upper lobe is similar in size to previous CTS, although demonstrates prominent peripheral hypermetabolic activity. 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 Hypermetabolic activity within the adjacent upper right lateral chest wall with right 3rd through 5th rib deformities, suspicious for direct tumor spread. No evidence of distant osseous metastatic disease; Hypermetabolic left hilar lymph node worrisome for metastatic disease. No other hypermetabolic lymph nodes or distant metastases identified.  Bronchoscopy/EBUS [JAN 26th, 2024- Dr.A]- BAL/ 4R-lymph node FNA negative for malignancy.  Patient declined further invasive procedures.  # FEB 15t, 2024- RE-Start Keytruda.   # DEC 2022- s/p ID- Dr.ravishankar- ? Lung abscess-no antibiotics-  JAN 2023- CT scan incidental- cecal mass- colonoscopy[Dr.Russo; [facility]- high grade dysplasia]  S/p evaluation with Dr. Sherryle Lis surgery for now]   # S/p evaluation with Dr. Sheppard Penton.   Primary cancer of right upper lobe of lung (HCC)  06/03/2021 Initial Diagnosis   Primary cancer of right upper lobe of lung (HCC)   06/03/2021 Cancer Staging   Staging form: Lung, AJCC 8th Edition - Clinical: Stage IIIB (cT3, cN2, cM0) - Signed by  Louretta Shorten  R, MD on 06/03/2021   06/15/2021 - 08/03/2021 Chemotherapy   Patient is on Treatment Plan : LUNG Carboplatin / Paclitaxel + XRT q7d     10/02/2021 - 10/19/2021 Chemotherapy   Patient is on Treatment Plan : LUNG Durvalumab q14d     02/03/2022 -  Chemotherapy   Patient is on Treatment Plan : LUNG NSCLC Pembrolizumab (200) q21d     02/04/2022 - 03/18/2022 Chemotherapy   Patient is on Treatment Plan : LUNG NSCLC Pembrolizumab (200) q21d      HISTORY OF PRESENTING ILLNESS: Patient is ambulating independently.  Accompanied by wife.  Rodney Vega 67 y.o.  male history of smoking -"lung cancer" [Limited necrotic tissue]-locally advanced unresectable- currently on surveillance [APRIL 2024-Keytruda was discontinued because of poor tolerance/patient preference] ; and Cecal mass-high grade dysplasia intact-  is here for follow-up.   Patient complains of poor appetite.  Worsening fatigue.  Sleeping more, weak.     Pt states he needed O2 most of the day, level was going down to 80's and then come back up. O2, 2L. Needed 4L at one point yesterday.  However currently off oxygen today this morning-saturations above 90.  Complains of worsening cough in the morning.    Patient continues to have right chest wall pain which is quite chronic.  He is taking oxycodone 2 to 3 pills/day. Continues to complain of intermittent constipation.  Is on daily stool softeners/MiraLAX.  Denies any worsening abdominal pain nausea vomiting.  Smokes weed.unfortunately continues to smoke about 1/2  pack cigarettes per day.  Denies any worsening abdominal pain.  No blood in stools black or stools.  Review of Systems  Constitutional:  Positive for malaise/fatigue and weight loss. Negative for chills, diaphoresis and fever.  HENT:  Negative for nosebleeds and sore throat.   Eyes:  Negative for double vision.  Respiratory:  Positive for cough, sputum production and shortness of breath. Negative for hemoptysis and  wheezing.   Cardiovascular:  Positive for chest pain. Negative for palpitations, orthopnea and leg swelling.  Gastrointestinal:  Positive for nausea. Negative for abdominal pain, blood in stool, constipation, diarrhea, heartburn, melena and vomiting.  Genitourinary:  Negative for dysuria, frequency and urgency.  Musculoskeletal:  Negative for back pain and joint pain.  Skin: Negative.  Negative for itching and rash.  Neurological:  Positive for tingling. Negative for dizziness, focal weakness, weakness and headaches.  Endo/Heme/Allergies:  Does not bruise/bleed easily.  Psychiatric/Behavioral:  Negative for depression. The patient is not nervous/anxious and does not have insomnia.      MEDICAL HISTORY:  Past Medical History:  Diagnosis Date   Acute on chronic respiratory failure (HCC)    Anemia    Cancer (HCC)    Colostomy present (HCC)    COPD (chronic obstructive pulmonary disease) (HCC)    Degenerative arthritis of knee    Depression    Diabetes mellitus without complication (HCC)    Diverticulitis    Dyspnea    Glaucoma    since 2004   Hernia 1979   History of kidney stones    History of methicillin resistant staphylococcus aureus (MRSA)    Hypertension    Hypokalemia    Marijuana use    Mass of cecum    Nephrolithiasis    Neuromuscular disorder (HCC)    Personal history of tobacco use, presenting hazards to health    Pneumonia    Port-A-Cath in place    Pre-diabetes    Primary cancer of right upper lobe of lung (  HCC)    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)    Screening for obesity    Severe sepsis [facility])    Special screening for malignant neoplasms, colon    Tobacco use    Wears dentures    full upper and lower    SURGICAL HISTORY: Past Surgical History:  Procedure Laterality Date   BACK SURGERY  1994, 2012   lumbar bulging disc   BRONCHOSCOPY  05/05/2022   CATARACT EXTRACTION W/PHACO Left 01/20/2021   Procedure: CATARACT EXTRACTION PHACO AND  INTRAOCULAR LENS PLACEMENT (IOC) LEFT;  Surgeon: Galen Manila, MD;  Location: [facility];  Service: Ophthalmology;  Laterality: Left;  10.13 0:52.1   COLONOSCOPY  [id number]   [facility]/Dr. Carmell Austria sessile polyp,44mm, found in rectum,multiple diverticula were found in the sigmoid, in descending and in transverse colon   COLONOSCOPY WITH PROPOFOL N/A 10/22/2021   Procedure: COLONOSCOPY WITH PROPOFOL;  Surgeon: Jaynie Collins, DO;  Location: [facility] ENDOSCOPY;  Service: Gastroenterology;  Laterality: N/A;  DM   COLOSTOMY  04/20/2013   EYE SURGERY     HERNIA REPAIR  1979   inguinal   IR IMAGING GUIDED PORT INSERTION  06/10/2021   JOINT REPLACEMENT Right    knee   KNEE ARTHROPLASTY Right 05/08/2018   Procedure: COMPUTER ASSISTED TOTAL KNEE ARTHROPLASTY;  Surgeon: Donato Heinz, MD;  Location: [facility];  Service: Orthopedics;  Laterality: Right;   KNEE ARTHROPLASTY Left 11/16/2019   Procedure: COMPUTER ASSISTED TOTAL KNEE ARTHROPLASTY;  Surgeon: Donato Heinz, MD;  Location: [facility];  Service: Orthopedics;  Laterality: Left;   LAPAROTOMY  04/20/2013   colon resection with colosotmy   LITHOTRIPSY  2013   LUNG BIOPSY  2023   RIGID BRONCHOSCOPY N/A 10/29/2022   Procedure: RIGID BRONCHOSCOPY;  Surgeon: [name], MD;  Location: [facility];  Service: Thoracic;  Laterality: N/A;   TONSILLECTOMY AND ADENOIDECTOMY  1962   VIDEO BRONCHOSCOPY WITH ENDOBRONCHIAL NAVIGATION N/A 05/05/2022   Procedure: VIDEO BRONCHOSCOPY WITH ENDOBRONCHIAL NAVIGATION;  Surgeon: [name], MD;  Location: [facility];  Service: Thoracic;  Laterality: N/A;   VIDEO BRONCHOSCOPY WITH ENDOBRONCHIAL ULTRASOUND N/A 05/05/2022   Procedure: VIDEO BRONCHOSCOPY WITH ENDOBRONCHIAL ULTRASOUND;  Surgeon: [name], MD;  Location: [facility];  Service: Thoracic;  Laterality: N/A;   VIDEO BRONCHOSCOPY WITH ENDOBRONCHIAL ULTRASOUND N/A 10/29/2022   Procedure: VIDEO BRONCHOSCOPY WITH ENDOBRONCHIAL ULTRASOUND;  Surgeon:  [name], MD;  Location: [facility];  Service: Thoracic;  Laterality: N/A;    SOCIAL HISTORY: Social History   Socioeconomic History   Marital status: Married    Spouse name: Pegge   Number of children: Not on file   Years of education: Not on file   Highest education level: Not on file  Occupational History   Not on file  Tobacco Use   Smoking status: Every Day    Current packs/day: 1.00    Average packs/day: 1 pack/day for 30.0 years (30.0 ttl pk-yrs)    Types: Cigarettes   Smokeless tobacco: Never   Tobacco comments:    since age 89 or 63  Vaping Use   Vaping status: Never Used  Substance and Sexual Activity   Alcohol use: Yes    Alcohol/week: 2.0 standard drinks of alcohol    Types: 2 Standard drinks or equivalent per week    Comment: rare   Drug use: Yes    Types: Marijuana    Comment: fentanyl patch prescribed & oxycodone   Sexual activity: Not on file  Other  Topics Concern   Not on file  Social History Narrative   [place] /[place]; smoking; not much alcohol. Worked for [employer], 2019. Marland Kitchen    Social Determinants of Health   Financial Resource Strain: Low Risk  (07/20/2023)   [facility] from [facility]   Overall Financial Resource Strain (CARDIA)    Difficulty of Paying Living Expenses: Not hard at all  Food Insecurity: No Food Insecurity (07/20/2023)   Received from [facility]   Hunger Vital Sign    Worried About Running Out of Food in the Last Year: Never true    Ran Out of Food in the Last Year: Never true  Transportation Needs: No Transportation Needs (07/20/2023)   Received from [facility] - Transportation    In the past 12 months, has lack of transportation kept you from medical appointments or from getting medications?: No    Lack of Transportation (Non-Medical): No  Physical Activity: Not on file  Stress: No Stress Concern Present (08/10/2022)   [employer] of [employer]ational [employer] - Occupational Stress Questionnaire    Feeling of Stress : Not at all  Social Connections: Unknown (08/10/2022)   Social Connection and Isolation Panel [NHANES]    Frequency of Communication with Friends and Family: Three times a week    Frequency of Social Gatherings with Friends and Family: Twice a week    Attends Religious Services: Patient declined    Database administrator or Organizations: No    Attends Banker Meetings: Never    Marital Status: Married  Catering manager Violence: Not At Risk (01/20/2023)   Humiliation, Afraid, Rape, and Kick questionnaire    Fear of Current or Ex-Partner: No    Emotionally Abused: No    Physically Abused: No    Sexually Abused: No    FAMILY HISTORY: Family History  Problem Relation Age of Onset   Diabetes Mother    Heart disease Mother    Heart attack Father    Prostate cancer Father     ALLERGIES:  is allergic to paclitaxel, ambien [zolpidem], nsaids, and aleve [naproxen sodium].  MEDICATIONS:  Current Outpatient Medications  Medication Sig Dispense Refill   Accu-Chek Softclix Lancets lancets USE AS DIRECTED FOUR TIMES DAILY 100 each 12   acetaminophen (TYLENOL) 500 MG tablet Take 2 tablets (1,000 mg total) by mouth daily as needed. Home med. 30 tablet 0   albuterol (PROVENTIL) (2.5 MG/3ML) 0.083% nebulizer solution Take 2.5 mg by nebulization every 4 (four) hours as needed for wheezing or shortness of breath.     albuterol (VENTOLIN HFA) 108 (90 Base) MCG/ACT inhaler SMARTSIG:2 inhalation Via Inhaler Every 6 Hours PRN 1 each 2   blood glucose meter kit and supplies KIT Check your blood glucose levels twice a day; once in the morning before breakfast; and once in the evening after dinner. 1 each 0   BREZTRI AEROSPHERE 160-9-4.8 MCG/ACT AERO Inhale 2 puffs into the lungs 2 (two) times daily.     dextromethorphan-guaiFENesin (MUCINEX DM) 30-600 MG 12hr tablet Take 1 tablet by mouth 2 (two) times  daily as needed for cough.     dorzolamide-timolol (COSOPT) 22.3-6.8 MG/ML ophthalmic solution Place 1 drop into both eyes 2 (two) times daily.     glucose blood test strip Check your blood glucose levels twice a day; once in the morning before breakfast; and once in the evening after dinner. 100 each 12  ipratropium-albuterol (DUONEB) 0.5-2.5 (3) MG/3ML SOLN Take 3 mLs by nebulization every 6 (six) hours as needed. 360 mL 1   latanoprost (XALATAN) 0.005 % ophthalmic solution Place 1 drop into both eyes at bedtime.     lidocaine-prilocaine (EMLA) cream Apply 1 Application topically as needed. 30 g 3   montelukast (SINGULAIR) 10 MG tablet TAKE 1 TABLET(10 MG) BY MOUTH AT BEDTIME 90 tablet 0   Multiple Vitamin (MULTIVITAMIN WITH MINERALS) TABS tablet Take 1 tablet by mouth daily.     potassium chloride SA (KLOR-CON M) 20 MEQ tablet Take 1 tablet (20 mEq total) by mouth daily. 30 tablet 1   predniSONE (DELTASONE) 20 MG tablet Take 1 tablet (20 mg total) by mouth daily with breakfast. 30 tablet 0   pregabalin (LYRICA) 50 MG capsule Take 1 capsule (50 mg total) by mouth 2 (two) times daily. 60 capsule 2   prochlorperazine (COMPAZINE) 10 MG tablet TAKE 1 TABLET(10 MG) BY MOUTH EVERY 6 HOURS AS NEEDED FOR NAUSEA OR VOMITING 30 tablet 1   propranolol ER (INDERAL LA) 60 MG 24 hr capsule Take 1 capsule (60 mg total) by mouth daily. 30 capsule 3   Respiratory Therapy Supplies (NEBULIZER) DEVI Use as directed 1 each 0   tamsulosin (FLOMAX) 0.4 MG CAPS capsule Take 0.4 mg by mouth daily after supper.     triamcinolone ointment (KENALOG) 0.5 % Apply 1 Application topically 2 (two) times daily. 30 g 1   venlafaxine XR (EFFEXOR-XR) 75 MG 24 hr capsule Take 225 mg by mouth daily with breakfast.     eszopiclone (LUNESTA) 1 MG TABS tablet TAKE 1 TABLET(1 MG) BY MOUTH AT BEDTIME AS NEEDED FOR SLEEP 30 tablet 3   fentaNYL (DURAGESIC) 25 MCG/HR Place 1 patch onto the skin every 3 (three) days. 10 patch 0   glipiZIDE  (GLUCOTROL) 5 MG tablet TAKE 1 TABLET(5 MG) BY MOUTH DAILY BEFORE BREAKFAST (Patient not taking: Reported on 05/17/2023) 90 tablet 2   hydrOXYzine (ATARAX) 10 MG tablet Take 1 tablet (10 mg total) by mouth at bedtime as needed for itching. 30 tablet 1   metFORMIN (GLUCOPHAGE) 500 MG tablet Take 1 tablet (500 mg total) by mouth 2 (two) times daily with a meal. (Patient not taking: Reported on 05/17/2023) 60 tablet 2   naloxone (NARCAN) nasal spray 4 mg/0.1 mL Place 1 spray into the nose once. (Patient not taking: Reported on 05/17/2023)     olmesartan (BENICAR) 40 MG tablet Take 1 tablet (40 mg total) by mouth every morning. (Patient not taking: Reported on 02/22/2023) 30 tablet 3   oxyCODONE (OXY IR/ROXICODONE) 5 MG immediate release tablet Take 1 tablet (5 mg total) by mouth every 6 (six) hours as needed for severe pain (pain score 7-10). 90 tablet 0   vitamin B-12 (CYANOCOBALAMIN) 500 MCG tablet Take 1 tablet by mouth daily. (Patient not taking: Reported on 05/17/2023)     No current facility-administered medications for this visit.   Facility-Administered Medications Ordered in Other Visits  Medication Dose Route Frequency Provider Last Rate Last Admin   heparin lock flush 100 UNIT/ML injection            heparin lock flush 100 UNIT/ML injection            heparin lock flush 100 UNIT/ML injection             PHYSICAL EXAMINATION: ECOG PERFORMANCE STATUS: 1 - Symptomatic but completely ambulatory  Vitals:   07/26/23 0913  BP: 129/74  Pulse:  98  Temp: 98.4 F (36.9 C)  SpO2: 97%    Filed Weights   07/26/23 0913  Weight: 158 lb 4.8 oz (71.8 kg)     Physical Exam Vitals and nursing note reviewed.  HENT:     Head: Normocephalic and atraumatic.     Mouth/Throat:     Pharynx: Oropharynx is clear.  Eyes:     Extraocular Movements: Extraocular movements intact.     Pupils: Pupils are equal, round, and reactive to light.  Cardiovascular:     Rate and Rhythm: Normal rate and regular  rhythm.  Pulmonary:     Comments: Decreased breath sounds bilaterally.  Abdominal:     Palpations: Abdomen is soft.  Musculoskeletal:        General: Normal range of motion.     Cervical back: Normal range of motion.  Skin:    General: Skin is warm.  Neurological:     General: No focal deficit present.     Mental Status: He is alert and oriented to person, place, and time.  Psychiatric:        Behavior: Behavior normal.        Judgment: Judgment normal.    LABORATORY DATA:  I have reviewed the data as listed Lab Results  Component Value Date   WBC 7.9 07/26/2023   HGB 12.1 (L) 07/26/2023   HCT 36.7 (L) 07/26/2023   MCV 86.4 07/26/2023   PLT 215 07/26/2023   Recent Labs    06/29/23 1008 07/12/23 1007 07/26/23 0903  NA 136 136 137  K 3.3* 3.2* 3.4*  CL 103 101 101  CO2 27 25 26   GLUCOSE 188* 224* 227*  BUN 9 12 9   CREATININE 1.17 1.23 1.20  CALCIUM 8.7* 8.6* 8.9  GFRNONAA >60 >60 >60  PROT 6.9 6.8 7.2  ALBUMIN 3.4* 3.5 3.5  AST 12* 15 18  ALT 7 7 7   ALKPHOS 92 101 107  BILITOT 0.3 0.3 0.4    RADIOGRAPHIC STUDIES: I have personally reviewed the radiological images as listed and agreed with the findings in the report. No results found.  ASSESSMENT & PLAN:   Primary cancer of right upper lobe of lung (HCC) # UNRESECTABLE/LOCALLY ADVANCED- Right upper lobe lung mass -concerning for malignancy [biopsy inconclusive/atypical cells]-  April 2023- PD-L1 positive [cirucologene].   Currently OFF therapy/on surveillance [given poor tolerance to Keytruda]. CT SEP 16th, 2024- . Relatively similar infiltrative right upper lobe lung mass with direct chest wall invasion and secondary bronchopulmonary fistula;  Similar left hilar adenopathy;  No change in peribronchovascular ill-defined micronodularity which could represent chronic atypical infection or endobronchial spread of tumor.   # continue surveillance off therap [since april 2024- sec to poor tolerance] on a clinical  basis. Discussed re: slow clinical progression noted; again reviewed the inability to tolerate the chemo/immunotherapy.  Clinically stable.  Will repeat the scan again in 2 months.  Will order at next visit.  # Anemia- mild to moderate- feb 2024- iron sat- 17%; ferritin- 61-  Stable.    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   # COPD/fatigue- [coughing/phlegm/ no fevers]- on albuterol/advair [smoking]- TSH- WNL;Continue  Xopenex/ipratropium nebs-; Mucinex- DM BID-slightly worse.  Recommend prednisone.  # Hypokalemia-3.4 - continue potassium 20 mEq liquid.-Hypocalemia: 8.4/albumin- 3.2. MARCH 2024-vit D 105- HOLD off -Vit D 50,K. Stable.   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 Lemar Livings [Dr.[facility]ow[facility]] CT  SEP 2024- mass lesion identified along the cecum obstructing the orifice of the appendix.  Discussed with Dr. Maia Plan -risk of appendiceal perforation if progression noted.  Discussed with the patient and wife -not too keen on any surgical options at this time given overall comorbidities.  # Labile Blood glucose- BG as low as 25; taken off glipizide-  Metformin [PCP]- 228-stable.  Monitor closely on steroids prednisone 20 mg a day.  # Ongoing fatigue: Poor appetite-recommend trial of prednisone 20 mg a day.   # IV mediport: functioning/stable.   # Insomnia-continue Lunesta refilled- stable.    *appt thru mychart   # DISPOSITION: # De-access-NO IVFs or KCL # follow up in 4 week Tuesday- MD; labs- cbc/cmp; TSH; possible  IVFs-1 lit /1 hour;KCL 20 IV-  Dr.B       All questions were answered. The patient knows to call the clinic with any problems, questions or concerns.    [name], MD 07/26/2023  10:41 AM

## 2023-07-27 ENCOUNTER — Ambulatory Visit: Payer: Medicare PPO

## 2023-07-27 ENCOUNTER — Other Ambulatory Visit: Payer: Medicare PPO

## 2023-07-27 ENCOUNTER — Ambulatory Visit: Payer: Medicare PPO | Admitting: Internal Medicine

## 2023-07-28 ENCOUNTER — Ambulatory Visit: Payer: Medicare PPO

## 2023-08-04 ENCOUNTER — Telehealth: Payer: Self-pay | Admitting: *Deleted

## 2023-08-04 ENCOUNTER — Other Ambulatory Visit: Payer: Self-pay | Admitting: *Deleted

## 2023-08-04 DIAGNOSIS — C3411 Malignant neoplasm of upper lobe, right bronchus or lung: Secondary | ICD-10-CM

## 2023-08-04 NOTE — Telephone Encounter (Signed)
Returned call to patient's wife Gigi Gin after discussing his case with Elouise Munroe, NP.  Per Sharia Reeve, patient can be seen in the symptom management clinic tomorrow, go for an x-ray of his ribs, and she can increase his oxycodone from 5 mg to 10 mg every 4 hours until he is seen tomorrow.  Gigi Gin is agreeable to the plan.  Scheduling to call him with his appointment.

## 2023-08-04 NOTE — Telephone Encounter (Signed)
See separate note from [facility]

## 2023-08-04 NOTE — Telephone Encounter (Signed)
Peggy called reporting that for 2 days, patient has had increasaed right rib pain and that within the past hour while she was gone to pick up her grandchild he reported to her when she got home that he felt a "pop" in his rib but is unable to state the location of the pop. He is hurting worse and she is asking if something can be done for him. If unable to see today, she is asking if e can increase his Oxycodone until he can be seen. He takes 4 Oxy 5 mg per day and is also using his Fentanyl 25 mcg. Please advise

## 2023-08-05 ENCOUNTER — Ambulatory Visit
Admission: RE | Admit: 2023-08-05 | Discharge: 2023-08-05 | Disposition: A | Discharge status: Home / Self Care | Payer: Medicare PPO | Attending: Hospice and Palliative Medicine | Admitting: Hospice and Palliative Medicine

## 2023-08-05 ENCOUNTER — Inpatient Hospital Stay: Payer: Medicare PPO | Attending: Internal Medicine | Admitting: Hospice and Palliative Medicine

## 2023-08-05 ENCOUNTER — Ambulatory Visit
Admission: RE | Admit: 2023-08-05 | Discharge: 2023-08-05 | Disposition: A | Discharge status: Home / Self Care | Payer: Medicare PPO | Source: Ambulatory Visit | Attending: Hospice and Palliative Medicine

## 2023-08-05 ENCOUNTER — Encounter: Payer: Self-pay | Admitting: Hospice and Palliative Medicine

## 2023-08-05 ENCOUNTER — Other Ambulatory Visit: Payer: Self-pay

## 2023-08-05 VITALS — BP 136/79 | HR 86 | Temp 97.8°F | Resp 20 | Ht 71.0 in | Wt 156.1 lb

## 2023-08-05 DIAGNOSIS — C3411 Malignant neoplasm of upper lobe, right bronchus or lung: Secondary | ICD-10-CM | POA: Diagnosis present

## 2023-08-05 DIAGNOSIS — G893 Neoplasm related pain (acute) (chronic): Secondary | ICD-10-CM | POA: Insufficient documentation

## 2023-08-05 DIAGNOSIS — Z923 Personal history of irradiation: Secondary | ICD-10-CM | POA: Diagnosis not present

## 2023-08-05 DIAGNOSIS — C7951 Secondary malignant neoplasm of bone: Secondary | ICD-10-CM | POA: Insufficient documentation

## 2023-08-05 DIAGNOSIS — Z79899 Other long term (current) drug therapy: Secondary | ICD-10-CM | POA: Insufficient documentation

## 2023-08-05 DIAGNOSIS — Z9221 Personal history of antineoplastic chemotherapy: Secondary | ICD-10-CM | POA: Insufficient documentation

## 2023-08-05 MED ORDER — OXYCODONE HCL 5 MG PO TABS: 5.0000 mg | ORAL_TABLET | ORAL | 0 refills | Status: DC | PRN

## 2023-08-05 MED ORDER — FENTANYL 50 MCG/HR TD PT72: 1.0000 | MEDICATED_PATCH | TRANSDERMAL | 0 refills | Status: DC

## 2023-08-05 NOTE — Progress Notes (Signed)
Symptom Management Clinic [facility]  Telephone:(336) [phone] Fax:([phone]) [phone]  Patient Care Team: [name], MD as PCP - General (Family Medicine) [name], RN as Oncology Nurse Navigator Orson Ape, MD as Consulting Physician (Urology) [name], MD as Consulting Physician (Oncology) [name], MD as Consulting Physician (Pulmonary Disease)   Name of the patient: Rodney Vega  [id number]  26-Dec-1955   Date of visit: 08/05/23  Reason for Consult: Rodney Vega is a 67 year old man with multiple medical problems including recurrent unresectable locally advanced non-small cell lung cancer status post chemotherapy and radiation.  Most recently on surveillance given poor tolerance for immunotherapy.  Interval history: Patient presents [facility] today with complaint of worsening right sided chest wall pain over the past 1 to 2 weeks.  Yesterday, he was turning and in the chair and heard and felt a "pop" at his right chest.  This was associated with intense pain but patient denies shortness of breath chest pain/pressure.  Of note, patient has history of pathologic rib fractures and is previously undergone radiation treatments for same.  Patient says that he has been using the oxycodone more liberally than prescribed to manage the pain.  However, he says that the pain is easing off currently, which he rates as 1-2 out of 10.  Denies any neurologic complaints. Denies any easy bleeding or bruising.  No fever or chills. Denies chest pain. Denies any nausea, vomiting, constipation, or diarrhea.  Patient offers no further specific complaints today.  PAST MEDICAL HISTORY: Past Medical History:  Diagnosis Date   Acute on chronic respiratory failure (HCC)    Anemia    Cancer (HCC)    Colostomy present (HCC)    COPD (chronic obstructive pulmonary disease) (HCC)    Degenerative arthritis of knee    Depression    Diabetes mellitus without  complication (HCC)    Diverticulitis    Dyspnea    Glaucoma    since 2004   Hernia 1979   History of kidney stones    History of methicillin resistant staphylococcus aureus (MRSA)    Hypertension    Hypokalemia    Marijuana use    Mass of cecum    Nephrolithiasis    Neuromuscular disorder (HCC)    Personal history of tobacco use, presenting hazards to health    Pneumonia    Port-A-Cath in place    Pre-diabetes    Primary cancer of right upper lobe of lung (HCC)    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)    Screening for obesity    Severe sepsis (HCC)    Special screening for malignant neoplasms, colon    Tobacco use    Wears dentures    full upper and lower    PAST SURGICAL HISTORY:  Past Surgical History:  Procedure Laterality Date   BACK SURGERY  1994, 2012   lumbar bulging disc   BRONCHOSCOPY  05/05/2022   CATARACT EXTRACTION W/PHACO Left 01/20/2021   Procedure: CATARACT EXTRACTION PHACO AND INTRAOCULAR LENS PLACEMENT (IOC) LEFT;  Surgeon: Galen Manila, MD;  Location: [facility];  Service: Ophthalmology;  Laterality: Left;  10.13 0:52.1   COLONOSCOPY  [id number]   [facility]/Dr. Carmell Austria sessile polyp,1mm, found in rectum,multiple diverticula were found in the sigmoid, in descending and in transverse colon   COLONOSCOPY WITH PROPOFOL N/A 10/22/2021   Procedure: COLONOSCOPY WITH PROPOFOL;  Surgeon: Jaynie Collins, DO;  Location: [facility] ENDOSCOPY;  Service: Gastroenterology;  Laterality: N/A;  DM  COLOSTOMY  04/20/2013   EYE SURGERY     HERNIA REPAIR  1979   inguinal   IR IMAGING GUIDED PORT INSERTION  06/10/2021   JOINT REPLACEMENT Right    knee   KNEE ARTHROPLASTY Right 05/08/2018   Procedure: COMPUTER ASSISTED TOTAL KNEE ARTHROPLASTY;  Surgeon: Donato Heinz, MD;  Location: [facility];  Service: Orthopedics;  Laterality: Right;   KNEE ARTHROPLASTY Left 11/16/2019   Procedure: COMPUTER ASSISTED TOTAL KNEE ARTHROPLASTY;  Surgeon: Donato Heinz, MD;   Location: [facility];  Service: Orthopedics;  Laterality: Left;   LAPAROTOMY  04/20/2013   colon resection with colosotmy   LITHOTRIPSY  2013   LUNG BIOPSY  2023   RIGID BRONCHOSCOPY N/A 10/29/2022   Procedure: RIGID BRONCHOSCOPY;  Surgeon: [name], MD;  Location: [facility];  Service: Thoracic;  Laterality: N/A;   TONSILLECTOMY AND ADENOIDECTOMY  1962   VIDEO BRONCHOSCOPY WITH ENDOBRONCHIAL NAVIGATION N/A 05/05/2022   Procedure: VIDEO BRONCHOSCOPY WITH ENDOBRONCHIAL NAVIGATION;  Surgeon: [name], MD;  Location: [facility];  Service: Thoracic;  Laterality: N/A;   VIDEO BRONCHOSCOPY WITH ENDOBRONCHIAL ULTRASOUND N/A 05/05/2022   Procedure: VIDEO BRONCHOSCOPY WITH ENDOBRONCHIAL ULTRASOUND;  Surgeon: [name], MD;  Location: [facility];  Service: Thoracic;  Laterality: N/A;   VIDEO BRONCHOSCOPY WITH ENDOBRONCHIAL ULTRASOUND N/A 10/29/2022   Procedure: VIDEO BRONCHOSCOPY WITH ENDOBRONCHIAL ULTRASOUND;  Surgeon: [name], MD;  Location: [facility];  Service: Thoracic;  Laterality: N/A;    HEMATOLOGY/ONCOLOGY HISTORY:  Oncology History Overview Note  AUG 2022- 8.2 x 5.9 cm spiculated mass noted in right upper lobe consistent with malignancy. It extends from the right hilum to the lateral chest wall and results in lytic destruction of the lateral portion of the right fourth rib.  Mediastinal lymph nodes are noted, including 12 mm right hilar lymph node, consistent with metastatic disease. 3 cm right adrenal mass is noted concerning for metastatic disease.  # AUG 2022- PET IMPRESSION: Peripherally hypermetabolic right upper lobe mass, likely due to central necrosis. Mass extends into the right hilum and right lateral chest wall involving the second through fourth right ribs.   Mildly enlarged and hypermetabolic right hilar lymph node.   No evidence of metastatic disease in the abdomen or pelvis.  # AUG, 2022-RIGHT CHEST WALL Bx-necrosis; suspicious for malignancy not definitive  [discussed with Dr.Kraynie;MDT] LUNG MASS, RIGHT; BIOPSY:  - SUSPICIOUS FOR MALIGNANCY.  - SEE COMMENT.   Comment:  Approximately six tissue cores demonstrate inflamed fibrous capsule with  hemosiderin-laden macrophages, in a background of abundant necrosis.  One of the cores demonstrates a minute fragment of viable epithelial  cells.  These cells are positive for p40.  They are negative for TTF1  and CK7. The cells of interest disappear on deeper cut levels.  While  the findings are suspicious for squamous cell carcinoma, there is not  enough viable tumor for a definitive diagnosis.  Additional tissue  sampling may be helpful.   # SEP 2022-cycle #2 Taxol hypersensitive reaction.  Switched over to #3 cycle- carbo-Abraxane starting 06/29/2021.  Discontinued IMFINZI therapy-December 2022 given poor tolerance/pneumonia admission to hospital.  #  [name] was in May-June 2023 x3 doses; stopped because of intolerance-fatigue patient preference/lack of progression.  # JAN 2024-bronchoscopy biopsy negative for malignancy; however PET scan concerning for recurrence/progression.    May 05, 2022-s/p bronchoscopy- with Dr.Aleskerov-significant purulent/necrotic debris noted. Dec 23rd, 2023- PET scan: The cavitary process in the right upper lobe is similar in size to previous CTS, although demonstrates  prominent peripheral hypermetabolic activity. 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 Hypermetabolic activity within the adjacent upper right lateral chest wall with right 3rd through 5th rib deformities, suspicious for direct tumor spread. No evidence of distant osseous metastatic disease; Hypermetabolic left hilar lymph node worrisome for metastatic disease. No other hypermetabolic lymph nodes or distant metastases identified.  Bronchoscopy/EBUS [JAN 26th, 2024- Dr.A]- BAL/  4R-lymph node FNA negative for malignancy.  Patient declined further invasive procedures.  # FEB 15t, 2024- RE-Start Keytruda.   # DEC 2022- s/p ID- Dr.ravishankar- ? Lung abscess-no antibiotics-  JAN 2023- CT scan incidental- cecal mass- colonoscopy[Dr.Russo; [facility]- high grade dysplasia]  S/p evaluation with Dr. Sherryle Lis surgery for now]   # S/p evaluation with Dr. Sheppard Penton.   Primary cancer of right upper lobe of lung (HCC)  06/03/2021 Initial Diagnosis   Primary cancer of right upper lobe of lung (HCC)   06/03/2021 Cancer Staging   Staging form: Lung, AJCC 8th Edition - Clinical: Stage IIIB (cT3, cN2, cM0) - Signed by [name], MD on 06/03/2021   06/15/2021 - 08/03/2021 Chemotherapy   Patient is on Treatment Plan : LUNG Carboplatin / Paclitaxel + XRT q7d     10/02/2021 - 10/19/2021 Chemotherapy   Patient is on Treatment Plan : LUNG Durvalumab q14d     02/03/2022 -  Chemotherapy   Patient is on Treatment Plan : LUNG NSCLC Pembrolizumab (200) q21d     02/04/2022 - 03/18/2022 Chemotherapy   Patient is on Treatment Plan : LUNG NSCLC Pembrolizumab (200) q21d       ALLERGIES:  is allergic to paclitaxel, ambien [zolpidem], nsaids, and aleve [naproxen sodium].  MEDICATIONS:  Current Outpatient Medications  Medication Sig Dispense Refill   Accu-Chek Softclix Lancets lancets USE AS DIRECTED FOUR TIMES DAILY 100 each 12   acetaminophen (TYLENOL) 500 MG tablet Take 2 tablets (1,000 mg total) by mouth daily as needed. Home med. 30 tablet 0   albuterol (PROVENTIL) (2.5 MG/3ML) 0.083% nebulizer solution Take 2.5 mg by nebulization every 4 (four) hours as needed for wheezing or shortness of breath.     albuterol (VENTOLIN HFA) 108 (90 Base) MCG/ACT inhaler SMARTSIG:2 inhalation Via Inhaler Every 6 Hours PRN 1 each 2   blood glucose meter kit and supplies KIT Check your blood glucose levels twice a day; once in the morning before breakfast; and once in the evening after dinner. 1  each 0   BREZTRI AEROSPHERE 160-9-4.8 MCG/ACT AERO Inhale 2 puffs into the lungs 2 (two) times daily.     dextromethorphan-guaiFENesin (MUCINEX DM) 30-600 MG 12hr tablet Take 1 tablet by mouth 2 (two) times daily as needed for cough.     dorzolamide-timolol (COSOPT) 22.3-6.8 MG/ML ophthalmic solution Place 1 drop into both eyes 2 (two) times daily.     eszopiclone (LUNESTA) 1 MG TABS tablet TAKE 1 TABLET(1 MG) BY MOUTH AT BEDTIME AS NEEDED FOR SLEEP 30 tablet 3   glucose blood test strip Check your blood glucose levels twice a day; once in the morning before breakfast; and once in the evening after dinner. 100 each 12   hydrOXYzine (ATARAX) 10 MG tablet Take 1 tablet (10 mg total) by mouth at bedtime as needed for itching. 30 tablet 1   ipratropium-albuterol (DUONEB) 0.5-2.5 (3) MG/3ML SOLN Take 3 mLs by nebulization every 6 (six) hours as needed. 360 mL 1   latanoprost (XALATAN) 0.005 % ophthalmic  solution Place 1 drop into both eyes at bedtime.     lidocaine-prilocaine (EMLA) cream Apply 1 Application topically as needed. 30 g 3   montelukast (SINGULAIR) 10 MG tablet TAKE 1 TABLET(10 MG) BY MOUTH AT BEDTIME 90 tablet 0   Multiple Vitamin (MULTIVITAMIN WITH MINERALS) TABS tablet Take 1 tablet by mouth daily.     oxyCODONE (OXY IR/ROXICODONE) 5 MG immediate release tablet Take 1 tablet (5 mg total) by mouth every 6 (six) hours as needed for severe pain (pain score 7-10). 90 tablet 0   potassium chloride SA (KLOR-CON M) 20 MEQ tablet Take 1 tablet (20 mEq total) by mouth daily. 30 tablet 1   predniSONE (DELTASONE) 20 MG tablet Take 1 tablet (20 mg total) by mouth daily with breakfast. 30 tablet 0   pregabalin (LYRICA) 50 MG capsule Take 1 capsule (50 mg total) by mouth 2 (two) times daily. 60 capsule 2   prochlorperazine (COMPAZINE) 10 MG tablet TAKE 1 TABLET(10 MG) BY MOUTH EVERY 6 HOURS AS NEEDED FOR NAUSEA OR VOMITING 30 tablet 1   propranolol ER (INDERAL LA) 60 MG 24 hr capsule Take 1 capsule (60  mg total) by mouth daily. 30 capsule 3   Respiratory Therapy Supplies (NEBULIZER) DEVI Use as directed 1 each 0   tamsulosin (FLOMAX) 0.4 MG CAPS capsule Take 0.4 mg by mouth daily after supper.     triamcinolone ointment (KENALOG) 0.5 % Apply 1 Application topically 2 (two) times daily. 30 g 1   venlafaxine XR (EFFEXOR-XR) 75 MG 24 hr capsule Take 225 mg by mouth daily with breakfast.     vitamin B-12 (CYANOCOBALAMIN) 500 MCG tablet Take 1 tablet by mouth daily.     glipiZIDE (GLUCOTROL) 5 MG tablet TAKE 1 TABLET(5 MG) BY MOUTH DAILY BEFORE BREAKFAST (Patient not taking: Reported on 05/17/2023) 90 tablet 2   metFORMIN (GLUCOPHAGE) 500 MG tablet Take 1 tablet (500 mg total) by mouth 2 (two) times daily with a meal. (Patient not taking: Reported on 05/17/2023) 60 tablet 2   naloxone (NARCAN) nasal spray 4 mg/0.1 mL Place 1 spray into the nose once. (Patient not taking: Reported on 05/17/2023)     olmesartan (BENICAR) 40 MG tablet Take 1 tablet (40 mg total) by mouth every morning. (Patient not taking: Reported on 02/22/2023) 30 tablet 3   No current facility-administered medications for this visit.   Facility-Administered Medications Ordered in Other Visits  Medication Dose Route Frequency Provider Last Rate Last Admin   heparin lock flush 100 UNIT/ML injection            heparin lock flush 100 UNIT/ML injection            heparin lock flush 100 UNIT/ML injection             VITAL SIGNS: BP 136/79   Pulse 86   Temp 97.8 F (36.6 C) (Tympanic)   Resp 20   Ht 5\' 11"  (1.803 m)   Wt 156 lb 1.6 oz (70.8 kg)   SpO2 99%   BMI 21.77 kg/m  Filed Weights   08/05/23 1045  Weight: 156 lb 1.6 oz (70.8 kg)     Estimated body mass index is 21.77 kg/m as calculated from the following:   Height as of this encounter: 5\' 11"  (1.803 m).   Weight as of this encounter: 156 lb 1.6 oz (70.8 kg).  LABS: CBC:    Component Value Date/Time   WBC 7.9 07/26/2023 0903   WBC 7.1 01/25/2023 0939  HGB 12.1  (L) 07/26/2023 0903   HGB 7.5 (L) 12/01/2021 0504   HCT 36.7 (L) 07/26/2023 0903   HCT 23.7 (L) 12/01/2021 0504   PLT 215 07/26/2023 0903   PLT 243 12/01/2021 0504   MCV 86.4 07/26/2023 0903   MCV 89 12/01/2021 0504   MCV 91 09/21/2013 0522   NEUTROABS 5.9 07/26/2023 0903   NEUTROABS 9.6 (H) 12/01/2021 0504   NEUTROABS 11.3 (H) 09/21/2013 0522   LYMPHSABS 1.0 07/26/2023 0903   LYMPHSABS 0.8 12/01/2021 0504   LYMPHSABS 1.4 09/21/2013 0522   MONOABS 0.5 07/26/2023 0903   MONOABS 0.9 09/21/2013 0522   EOSABS 0.4 07/26/2023 0903   EOSABS 0.3 12/01/2021 0504   EOSABS 0.0 09/21/2013 0522   BASOSABS 0.1 07/26/2023 0903   BASOSABS 0.0 12/01/2021 0504   BASOSABS 0.1 09/21/2013 0522   Comprehensive Metabolic Panel:    Component Value Date/Time   NA 137 07/26/2023 0903   NA 138 09/20/2013 0639   K 3.4 (L) 07/26/2023 0903   K 4.0 09/20/2013 0639   CL 101 07/26/2023 0903   CL 107 09/20/2013 0639   CO2 26 07/26/2023 0903   CO2 26 09/20/2013 0639   BUN 9 07/26/2023 0903   BUN 12 09/20/2013 0639   CREATININE 1.20 07/26/2023 0903   CREATININE 0.84 09/24/2013 0515   GLUCOSE 227 (H) 07/26/2023 0903   GLUCOSE 160 (H) 09/20/2013 0639   CALCIUM 8.9 07/26/2023 0903   CALCIUM 8.2 (L) 09/20/2013 0639   AST 18 07/26/2023 0903   ALT 7 07/26/2023 0903   ALT 20 01/08/2012 0939   ALKPHOS 107 07/26/2023 0903   ALKPHOS 69 01/08/2012 0939   BILITOT 0.4 07/26/2023 0903   PROT 7.2 07/26/2023 0903   PROT 6.5 09/12/2013 1002   PROT 6.9 01/08/2012 0939   ALBUMIN 3.5 07/26/2023 0903   ALBUMIN 4.4 09/12/2013 1002   ALBUMIN 3.5 01/08/2012 0939    RADIOGRAPHIC STUDIES: DG Ribs Bilateral W/Chest  Result Date: 08/05/2023 CLINICAL DATA:  Pain in right side near axilla, history of primary cancer of right upper lobe of lung EXAM: BILATERAL RIBS AND CHEST - 4+ VIEW COMPARISON:  None Available. FINDINGS: Displaced fracture of the right sixth rib, with redemonstrated lucency of the third, fourth, and fifth  ribs laterally. The displacement appears new compared to the 01/20/2023 radiograph and is likely new from the 06/20/2023 chest CT. Remote deformity the lateral right seventh rib. Left chest port with catheter tip in the low SVC. Redemonstrated cavitary mass in the right apex. No definite new focal pulmonary opacity. No pleural effusion or definite pneumothorax. IMPRESSION: 1. Displaced fracture of the right sixth rib, likely a pathologic fracture, with redemonstrated lytic lesions in the right third, fourth, and fifth ribs. The displacement appears new compared to the 01/20/2023 radiograph and is likely new from the 06/20/2023 chest CT. 2. Redemonstrated cavitary mass in the right apex. Electronically Signed   By: Wiliam Ke M.D.   On: 08/05/2023 12:59    PERFORMANCE STATUS (ECOG) : 1 - Symptomatic but completely ambulatory  Review of Systems Unless otherwise noted, a complete review of systems is negative.  Physical Exam General: NAD Cardiac: RRR Pulmonary: Chronically coarse to auscultation anterior/posterior fields, expiratory wheezing no bruising to chest wall Abdomen: Soft, nontender to palp Extremities: no edema, no joint deformities Skin: no rashes Neurological: Grossly nonfocal  Assessment and Plan: Unresectable/locally advanced non-small cell lung cancer -on surveillance due to poor tolerance of treatment.   Neoplasm pain -x-ray reveals displaced pathologic  right sixth rib fracture.  Will reach out to radiation oncology to see if further XRT would be warranted.  In interim, we will liberalize pain meds.  Discussed with patient and wife.  Will increase fentanyl to 50 mcg every 72 hours and liberalize frequency of oxycodone as needed for breakthrough pain.  Patient had fentanyl prescribed last week and discussed with wife that she may use two of the patches to equal dose. Also, discussed importance of maintaining a bowel regimen to prevent opioid-induced  constipation.  Follow-up telephone visit 1 to 2 weeks  Patient expressed understanding and was in agreement with this plan. He also understands that He can call clinic at any time with any questions, concerns, or complaints.   Thank you for allowing me to participate in the care of this very pleasant patient.   Time Total: 20 minutes  Visit consisted of counseling and education dealing with the complex and emotionally intense issues of symptom management in the setting of serious illness.Greater than 50%  of this time was spent counseling and coordinating care related to the above assessment and plan.  Signed by: Laurette Schimke, PhD, NP-C

## 2023-08-05 NOTE — Telephone Encounter (Signed)
Pt has an apt today - 11/1 with Sharia Reeve

## 2023-08-08 ENCOUNTER — Other Ambulatory Visit: Payer: Self-pay | Admitting: *Deleted

## 2023-08-08 DIAGNOSIS — M8448XA Pathological fracture, other site, initial encounter for fracture: Secondary | ICD-10-CM

## 2023-08-08 DIAGNOSIS — C3411 Malignant neoplasm of upper lobe, right bronchus or lung: Secondary | ICD-10-CM

## 2023-08-11 ENCOUNTER — Ambulatory Visit
Admission: RE | Admit: 2023-08-11 | Discharge: 2023-08-11 | Disposition: A | Discharge status: Home / Self Care | Payer: Medicare PPO | Source: Ambulatory Visit | Attending: Radiation Oncology | Admitting: Radiation Oncology

## 2023-08-11 VITALS — BP 155/83 | HR 103 | Resp 16 | Ht 71.0 in | Wt 157.2 lb

## 2023-08-11 DIAGNOSIS — C7951 Secondary malignant neoplasm of bone: Secondary | ICD-10-CM | POA: Insufficient documentation

## 2023-08-11 DIAGNOSIS — C3411 Malignant neoplasm of upper lobe, right bronchus or lung: Secondary | ICD-10-CM | POA: Diagnosis not present

## 2023-08-11 NOTE — Progress Notes (Signed)
Radiation Oncology Follow up Note old patient new area rib metastasis  Name: Rodney Vega   Date:   08/11/2023 MRN:  [id number] DOB: [birth year]    This 67 y.o. 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  He is not currently on narcotic analgesics medication and the pain is fairly well-controlled.  He is referred to radiation oncology for consideration of palliative treatment.  He specifically [place] cough hemoptysis or chest tightness.  COMPLICATIONS OF TREATMENT: none  FOLLOW UP COMPLIANCE: keeps appointments   PHYSICAL EXAM:  BP (!) 155/83   Pulse (!) 103   Resp 16   Ht 5\' 11"  (1.803 m)   Wt 157 lb 3.2 oz (71.3 kg)   BMI 21.92 kg/m  Wheelchair-bound male fairly frail in NAD.  Deep palpation of his right ribs does not elicit pain.  Well-developed well-nourished patient in NAD. HEENT reveals PERLA, EOMI, discs not visualized.  Oral cavity is clear. No oral mucosal lesions are identified. Neck is clear without evidence of cervical or supraclavicular adenopathy. Lungs are clear to A&P. Cardiac examination is essentially unremarkable with regular rate and rhythm without murmur rub or thrill. Abdomen is benign with no organomegaly or masses noted. Motor sensory and DTR levels are equal and symmetric in the upper and lower extremities. Cranial nerves II through XII are grossly intact. Proprioception is  intact. No peripheral adenopathy or edema is identified. No motor or sensory levels are noted. Crude visual fields are within normal range.  RADIOLOGY RESULTS: Plain films reviewed PET CT scan ordered  PLAN: At this time I ordered a PET CT scan to better delineate the areas of metastatic involvement.  Should show rib metastasis which I am sure will I would plan on delivering 30 Gray in 10 fractions.  Risks and benefits of treatment occluding low side effect profile were reviewed with the patient.  I will see him back for simulation after his PET scan has been completed.  Patient comprehensive recommendations well.  I would like to take this opportunity to thank you for allowing me to participate in the care of your patient.Carmina Miller, MD

## 2023-08-15 ENCOUNTER — Other Ambulatory Visit: Payer: Self-pay | Admitting: Internal Medicine

## 2023-08-15 DIAGNOSIS — C3411 Malignant neoplasm of upper lobe, right bronchus or lung: Secondary | ICD-10-CM

## 2023-08-16 ENCOUNTER — Other Ambulatory Visit: Payer: Self-pay | Admitting: Radiation Oncology

## 2023-08-16 ENCOUNTER — Ambulatory Visit
Admission: RE | Admit: 2023-08-16 | Discharge: 2023-08-16 | Disposition: A | Discharge status: Home / Self Care | Payer: Medicare PPO | Source: Ambulatory Visit | Attending: Radiation Oncology | Admitting: Radiation Oncology

## 2023-08-16 ENCOUNTER — Encounter: Payer: Self-pay | Admitting: Internal Medicine

## 2023-08-16 DIAGNOSIS — C7951 Secondary malignant neoplasm of bone: Secondary | ICD-10-CM | POA: Diagnosis present

## 2023-08-16 DIAGNOSIS — R918 Other nonspecific abnormal finding of lung field: Secondary | ICD-10-CM

## 2023-08-16 LAB — GLUCOSE, CAPILLARY: Glucose-Capillary: 112 mg/dL — ABNORMAL HIGH (ref 70–99)

## 2023-08-16 MED ORDER — FLUDEOXYGLUCOSE F - 18 (FDG) INJECTION
8.6000 | Freq: Once | INTRAVENOUS | Status: AC | PRN
Start: 2023-08-16 — End: 2023-08-16
  Administered 2023-08-16: 8.6 via INTRAVENOUS

## 2023-08-17 ENCOUNTER — Inpatient Hospital Stay (HOSPITAL_BASED_OUTPATIENT_CLINIC_OR_DEPARTMENT_OTHER): Payer: Medicare PPO | Admitting: Hospice and Palliative Medicine

## 2023-08-17 DIAGNOSIS — C3411 Malignant neoplasm of upper lobe, right bronchus or lung: Secondary | ICD-10-CM

## 2023-08-17 MED ORDER — HYDROXYZINE HCL 10 MG PO TABS: 10.0000 mg | ORAL_TABLET | Freq: Every evening | ORAL | 3 refills | Status: DC | PRN

## 2023-08-17 NOTE — Progress Notes (Signed)
I spoke with patient's wife.  She says that patient's pain is much improved with higher dose of fentanyl.  He has been using less oxycodone for breakthrough pain.  Patient is status post PET scan, which unfortunately is suggestive of disease progression.  Wife would like to move appointment with Dr. Donneta Romberg to this week if possible.  Discussed with Dr. Donneta Romberg and scheduler.

## 2023-08-18 ENCOUNTER — Encounter: Payer: Self-pay | Admitting: Internal Medicine

## 2023-08-19 ENCOUNTER — Encounter: Payer: Self-pay | Admitting: Internal Medicine

## 2023-08-19 ENCOUNTER — Inpatient Hospital Stay: Payer: Medicare PPO

## 2023-08-19 ENCOUNTER — Ambulatory Visit: Payer: Medicare PPO | Admitting: Internal Medicine

## 2023-08-19 ENCOUNTER — Inpatient Hospital Stay (HOSPITAL_BASED_OUTPATIENT_CLINIC_OR_DEPARTMENT_OTHER): Payer: Medicare PPO | Admitting: Hospice and Palliative Medicine

## 2023-08-19 ENCOUNTER — Inpatient Hospital Stay (HOSPITAL_BASED_OUTPATIENT_CLINIC_OR_DEPARTMENT_OTHER): Payer: Medicare PPO | Admitting: Internal Medicine

## 2023-08-19 DIAGNOSIS — C3411 Malignant neoplasm of upper lobe, right bronchus or lung: Secondary | ICD-10-CM | POA: Diagnosis not present

## 2023-08-19 LAB — CBC WITH DIFFERENTIAL (CANCER CENTER ONLY)
Abs Immature Granulocytes: 0.07 10*3/uL (ref 0.00–0.07)
Basophils Absolute: 0.1 10*3/uL (ref 0.0–0.1)
Basophils Relative: 1 %
Eosinophils Absolute: 0.2 10*3/uL (ref 0.0–0.5)
Eosinophils Relative: 2 %
HCT: 40.3 % (ref 39.0–52.0)
Hemoglobin: 13.2 g/dL (ref 13.0–17.0)
Immature Granulocytes: 1 %
Lymphocytes Relative: 15 %
Lymphs Abs: 1.4 10*3/uL (ref 0.7–4.0)
MCH: 28.8 pg (ref 26.0–34.0)
MCHC: 32.8 g/dL (ref 30.0–36.0)
MCV: 87.8 fL (ref 80.0–100.0)
Monocytes Absolute: 0.5 10*3/uL (ref 0.1–1.0)
Monocytes Relative: 5 %
Neutro Abs: 7.5 10*3/uL (ref 1.7–7.7)
Neutrophils Relative %: 76 %
Platelet Count: 151 10*3/uL (ref 150–400)
RBC: 4.59 MIL/uL (ref 4.22–5.81)
RDW: 14.6 % (ref 11.5–15.5)
WBC Count: 9.7 10*3/uL (ref 4.0–10.5)
nRBC: 0 % (ref 0.0–0.2)

## 2023-08-19 LAB — CMP (CANCER CENTER ONLY)
ALT: 15 U/L (ref 0–44)
AST: 17 U/L (ref 15–41)
Albumin: 3.4 g/dL — ABNORMAL LOW (ref 3.5–5.0)
Alkaline Phosphatase: 104 U/L (ref 38–126)
Anion gap: 12 (ref 5–15)
BUN: 14 mg/dL (ref 8–23)
CO2: 25 mmol/L (ref 22–32)
Calcium: 8.8 mg/dL — ABNORMAL LOW (ref 8.9–10.3)
Chloride: 99 mmol/L (ref 98–111)
Creatinine: 1.16 mg/dL (ref 0.61–1.24)
GFR, Estimated: >60 mL/min (ref >60)
Glucose, Bld: 236 mg/dL — ABNORMAL HIGH (ref 70–99)
Potassium: 3.3 mmol/L — ABNORMAL LOW (ref 3.5–5.1)
Sodium: 136 mmol/L (ref 135–145)
Total Bilirubin: 0.3 mg/dL (ref <1.2)
Total Protein: 6.6 g/dL (ref 6.5–8.1)

## 2023-08-19 LAB — TSH: TSH: 2.914 u[IU]/mL (ref 0.350–4.500)

## 2023-08-19 MED ORDER — PREDNISONE 20 MG PO TABS: 20.0000 mg | ORAL_TABLET | Freq: Every day | ORAL | 0 refills | Status: DC

## 2023-08-19 MED ORDER — ESZOPICLONE 1 MG PO TABS: ORAL_TABLET | ORAL | 3 refills | Status: DC

## 2023-08-19 MED ORDER — FENTANYL 50 MCG/HR TD PT72: 1.0000 | MEDICATED_PATCH | TRANSDERMAL | 0 refills | Status: DC

## 2023-08-19 MED ORDER — HEPARIN SOD (PORK) LOCK FLUSH 100 UNIT/ML IV SOLN
500.0000 [IU] | Freq: Once | INTRAVENOUS | Status: AC
Start: 2023-08-19 — End: 2023-08-19
  Administered 2023-08-19: 500 [IU] via INTRAVENOUS
  Filled 2023-08-19: qty 5

## 2023-08-19 MED ORDER — OXYCODONE HCL 5 MG PO TABS: 5.0000 mg | ORAL_TABLET | ORAL | 0 refills | Status: DC | PRN

## 2023-08-19 MED ORDER — OXYCODONE HCL 5 MG PO TABS: ORAL_TABLET | ORAL | 0 refills | Status: DC

## 2023-08-19 MED ORDER — SODIUM CHLORIDE 0.9% FLUSH
10.0000 mL | Freq: Once | INTRAVENOUS | Status: AC
Start: 2023-08-19 — End: 2023-08-19
  Administered 2023-08-19: 10 mL via INTRAVENOUS
  Filled 2023-08-19: qty 10

## 2023-08-19 NOTE — Progress Notes (Signed)
Patient had a PET scan on 08/16/2023. He is feeling real weak and fatigued. [name] was supposed to have more Oxycodone sent to the pharmacy but it isn't there yet.

## 2023-08-19 NOTE — Progress Notes (Signed)
[facility] CONSULT NOTE  Patient Care Team: [name], MD as PCP - General (Family Medicine) [name], RN as Oncology Nurse Navigator [name], [name], MD as Consulting Physician (Urology) [name], MD as Consulting Physician (Oncology) [name], MD as Consulting Physician (Pulmonary Disease)  CHIEF COMPLAINTS/PURPOSE OF CONSULTATION: lung cancer    Oncology History Overview Note  AUG 2022- 8.2 x 5.9 cm spiculated mass noted in right upper lobe consistent with malignancy. It extends from the right hilum to the lateral chest wall and results in lytic destruction of the lateral portion of the right fourth rib.  Mediastinal lymph nodes are noted, including 12 mm right hilar lymph node, consistent with metastatic disease. 3 cm right adrenal mass is noted concerning for metastatic disease.  # AUG 2022- PET IMPRESSION: Peripherally hypermetabolic right upper lobe mass, likely due to central necrosis. Mass extends into the right hilum and right lateral chest wall involving the second through fourth right ribs.   Mildly enlarged and hypermetabolic right hilar lymph node.   No evidence of metastatic disease in the abdomen or pelvis.  # AUG, 2022-RIGHT CHEST WALL Bx-necrosis; suspicious for malignancy not definitive [discussed with Dr.Kraynie;MDT] LUNG MASS, RIGHT; BIOPSY:  - SUSPICIOUS FOR MALIGNANCY.  - SEE COMMENT.   Comment:  Approximately six tissue cores demonstrate inflamed fibrous capsule with  hemosiderin-laden macrophages, in a background of abundant necrosis.  One of the cores demonstrates a minute fragment of viable epithelial  cells.  These cells are positive for p40.  They are negative for TTF1  and CK7. The cells of interest disappear on deeper cut levels.  While  the findings are suspicious for squamous cell carcinoma, there is not  enough viable tumor for a definitive diagnosis.  Additional tissue  sampling may be helpful.    # SEP 2022-cycle #2 Taxol hypersensitive reaction.  Switched over to #3 cycle- carbo-Abraxane starting 06/29/2021.  Discontinued IMFINZI therapy-December 2022 given poor tolerance/pneumonia admission to hospital.  #  [name] was in May-June 2023 x3 doses; stopped because of intolerance-fatigue patient preference/lack of progression.  # JAN 2024-bronchoscopy biopsy negative for malignancy; however PET scan concerning for recurrence/progression.    May 05, 2022-s/p bronchoscopy- with Dr.Aleskerov-significant purulent/necrotic debris noted. Dec 23rd, 2023- PET scan: The cavitary process in the right upper lobe is similar in size to previous CTS, although demonstrates prominent peripheral hypermetabolic activity. 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 Hypermetabolic activity within the adjacent upper right lateral chest wall with right 3rd through 5th rib deformities, suspicious for direct tumor spread. No evidence of distant osseous metastatic disease; Hypermetabolic left hilar lymph node worrisome for metastatic disease. No other hypermetabolic lymph nodes or distant metastases identified.  Bronchoscopy/EBUS [JAN 26th, 2024- Dr.A]- BAL/ 4R-lymph node FNA negative for malignancy.  Patient declined further invasive procedures.  # FEB 15t, 2024- RE-Start Keytruda.   # DEC 2022- s/p ID- Dr.ravishankar- ? Lung abscess-no antibiotics-  JAN 2023- CT scan incidental- cecal mass- colonoscopy[Dr.Russo; [facility]- high grade dysplasia]  S/p evaluation with Dr. Sherryle Lis surgery for now]   # S/p evaluation with Dr. Sheppard Penton.   Primary cancer of right upper lobe of lung (HCC)  06/03/2021 Initial Diagnosis   Primary cancer of right upper lobe of lung (HCC)   06/03/2021 Cancer Staging   Staging form: Lung, AJCC 8th Edition - Clinical: Stage IIIB (cT3, cN2, cM0) - Signed by  Louretta Shorten  R, MD on 06/03/2021   06/15/2021 - 08/03/2021 Chemotherapy   Patient is on Treatment Plan : LUNG Carboplatin / Paclitaxel + XRT q7d     10/02/2021 - 10/19/2021 Chemotherapy   Patient is on Treatment Plan : LUNG Durvalumab q14d     02/03/2022 -  Chemotherapy   Patient is on Treatment Plan : LUNG NSCLC Pembrolizumab (200) q21d     02/04/2022 - 03/18/2022 Chemotherapy   Patient is on Treatment Plan : LUNG NSCLC Pembrolizumab (200) q21d      HISTORY OF PRESENTING ILLNESS: Patient is ambulating independently.  Accompanied by wife.  Conlin Lebeau 67 y.o.  male history of smoking -"lung cancer" [Limited necrotic tissue]-locally advanced unresectable- currently on surveillance [APRIL 2024-Keytruda was discontinued because of poor tolerance/patient preference] ; and Cecal mass-high grade dysplasia intact-  is here for follow-up is here/reviews of the PET scan.  Patient had a PET scan on 08/16/2023. He is feeling real weak and fatigued.  Patient continues to have right chest wall pain -seems to be progressively getting worse.  Most recently on fentanyl patch 50 mcg and also taking oxycodone as needed for breakthrough pain medication.  Patient complains of poor appetite.  Worsening fatigue.  Sleeping more, weak.     Smokes weed.unfortunately continues to smoke about 1/2  pack cigarettes per day.  Denies any worsening abdominal pain.  No blood in stools black or stools.  Review of Systems  Constitutional:  Positive for malaise/fatigue and weight loss. Negative for chills, diaphoresis and fever.  HENT:  Negative for nosebleeds and sore throat.   Eyes:  Negative for double vision.  Respiratory:  Positive for cough, sputum production and shortness of breath. Negative for hemoptysis and wheezing.   Cardiovascular:  Positive for chest pain. Negative for palpitations, orthopnea and leg swelling.  Gastrointestinal:  Positive for nausea. Negative for abdominal pain, blood in stool,  constipation, diarrhea, heartburn, melena and vomiting.  Genitourinary:  Negative for dysuria, frequency and urgency.  Musculoskeletal:  Negative for back pain and joint pain.  Skin: Negative.  Negative for itching and rash.  Neurological:  Positive for tingling. Negative for dizziness, focal weakness, weakness and headaches.  Endo/Heme/Allergies:  Does not bruise/bleed easily.  Psychiatric/Behavioral:  Negative for depression. The patient is not nervous/anxious and does not have insomnia.      MEDICAL HISTORY:  Past Medical History:  Diagnosis Date   Acute on chronic respiratory failure (HCC)    Anemia    Cancer (HCC)    Colostomy present (HCC)    COPD (chronic obstructive pulmonary disease) (HCC)    Degenerative arthritis of knee    Depression    Diabetes mellitus without complication (HCC)    Diverticulitis    Dyspnea    Glaucoma    since 2004   Hernia 1979   History of kidney stones    History of methicillin resistant staphylococcus aureus (MRSA)    Hypertension    Hypokalemia    Marijuana use    Mass of cecum    Nephrolithiasis    Neuromuscular disorder (HCC)    Personal history of tobacco use, presenting hazards to health    Pneumonia    Port-A-Cath in place    Pre-diabetes    Primary cancer of right upper lobe of lung (HCC)    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)    Screening for obesity    Severe sepsis [facility])    Special screening for malignant neoplasms, colon    Tobacco use  Wears dentures    full upper and lower    SURGICAL HISTORY: Past Surgical History:  Procedure Laterality Date   BACK SURGERY  1994, 2012   lumbar bulging disc   BRONCHOSCOPY  05/05/2022   CATARACT EXTRACTION W/PHACO Left 01/20/2021   Procedure: CATARACT EXTRACTION PHACO AND INTRAOCULAR LENS PLACEMENT (IOC) LEFT;  Surgeon: Galen Manila, MD;  Location: [facility];  Service: Ophthalmology;  Laterality: Left;  10.13 0:52.1   COLONOSCOPY  [id number]   [facility]/Dr.  Carmell Austria sessile polyp,37mm, found in rectum,multiple diverticula were found in the sigmoid, in descending and in transverse colon   COLONOSCOPY WITH PROPOFOL N/A 10/22/2021   Procedure: COLONOSCOPY WITH PROPOFOL;  Surgeon: Jaynie Collins, DO;  Location: [facility];  Service: Gastroenterology;  Laterality: N/A;  DM   COLOSTOMY  04/20/2013   EYE SURGERY     HERNIA REPAIR  1979   inguinal   IR IMAGING GUIDED PORT INSERTION  06/10/2021   JOINT REPLACEMENT Right    knee   KNEE ARTHROPLASTY Right 05/08/2018   Procedure: COMPUTER ASSISTED TOTAL KNEE ARTHROPLASTY;  Surgeon: Donato Heinz, MD;  Location: [facility];  Service: Orthopedics;  Laterality: Right;   KNEE ARTHROPLASTY Left 11/16/2019   Procedure: COMPUTER ASSISTED TOTAL KNEE ARTHROPLASTY;  Surgeon: Donato Heinz, MD;  Location: [facility];  Service: Orthopedics;  Laterality: Left;   LAPAROTOMY  04/20/2013   colon resection with colosotmy   LITHOTRIPSY  2013   LUNG BIOPSY  2023   RIGID BRONCHOSCOPY N/A 10/29/2022   Procedure: RIGID BRONCHOSCOPY;  Surgeon: [name], MD;  Location: [facility];  Service: Thoracic;  Laterality: N/A;   TONSILLECTOMY AND ADENOIDECTOMY  1962   VIDEO BRONCHOSCOPY WITH ENDOBRONCHIAL NAVIGATION N/A 05/05/2022   Procedure: VIDEO BRONCHOSCOPY WITH ENDOBRONCHIAL NAVIGATION;  Surgeon: [name], MD;  Location: [facility];  Service: Thoracic;  Laterality: N/A;   VIDEO BRONCHOSCOPY WITH ENDOBRONCHIAL ULTRASOUND N/A 05/05/2022   Procedure: VIDEO BRONCHOSCOPY WITH ENDOBRONCHIAL ULTRASOUND;  Surgeon: [name], MD;  Location: [facility];  Service: Thoracic;  Laterality: N/A;   VIDEO BRONCHOSCOPY WITH ENDOBRONCHIAL ULTRASOUND N/A 10/29/2022   Procedure: VIDEO BRONCHOSCOPY WITH ENDOBRONCHIAL ULTRASOUND;  Surgeon: [name], MD;  Location: [facility];  Service: Thoracic;  Laterality: N/A;    SOCIAL HISTORY: Social History   Socioeconomic History   Marital status: Married    Spouse name: Pegge    Number of children: Not on file   Years of education: Not on file   Highest education level: Not on file  Occupational History   Not on file  Tobacco Use   Smoking status: Every Day    Current packs/day: 1.00    Average packs/day: 1 pack/day for 30.0 years (30.0 ttl pk-yrs)    Types: Cigarettes   Smokeless tobacco: Never   Tobacco comments:    since age 9 or 64  Vaping Use   Vaping status: Never Used  Substance and Sexual Activity   Alcohol use: Yes    Alcohol/week: 2.0 standard drinks of alcohol    Types: 2 Standard drinks or equivalent per week    Comment: rare   Drug use: Yes    Types: Marijuana    Comment: fentanyl patch prescribed & oxycodone   Sexual activity: Not on file  Other Topics Concern   Not on file  Social History Narrative   [place]; smoking; not much alcohol. Worked for [employer], 2019. Marland Kitchen    Social Determinants of Health   Financial Resource Strain:  Low Risk  (07/20/2023)   Received from [facility]   Overall Financial Resource Strain (CARDIA)    Difficulty of Paying Living Expenses: Not hard at all  Food Insecurity: No Food Insecurity (07/20/2023)   Received from [facility]   Hunger Vital Sign    Worried About Running Out of Food in the Last Year: Never true    Ran Out of Food in the Last Year: Never true  Transportation Needs: No Transportation Needs (07/20/2023)   Received from [facility] - Transportation    In the past 12 months, has lack of transportation kept you from medical appointments or from getting medications?: No    Lack of Transportation (Non-Medical): No  Physical Activity: Not on file  Stress: No Stress Concern Present (08/10/2022)   [employer] of Occupational Health - Occupational Stress Questionnaire    Feeling of Stress : Not at all  Social Connections: Unknown (08/10/2022)   Social Connection and Isolation Panel [NHANES]     Frequency of Communication with Friends and Family: Three times a week    Frequency of Social Gatherings with Friends and Family: Twice a week    Attends Religious Services: Patient declined    Database administrator or Organizations: No    Attends Banker Meetings: Never    Marital Status: Married  Catering manager Violence: Not At Risk (01/20/2023)   Humiliation, Afraid, Rape, and Kick questionnaire    Fear of Current or Ex-Partner: No    Emotionally Abused: No    Physically Abused: No    Sexually Abused: No    FAMILY HISTORY: Family History  Problem Relation Age of Onset   Diabetes Mother    Heart disease Mother    Heart attack Father    Prostate cancer Father     ALLERGIES:  is allergic to paclitaxel, ambien [zolpidem], nsaids, and aleve [naproxen sodium].  MEDICATIONS:  Current Outpatient Medications  Medication Sig Dispense Refill   azithromycin (ZITHROMAX) 250 MG tablet Take by mouth.     Accu-Chek Softclix Lancets lancets USE AS DIRECTED FOUR TIMES DAILY 100 each 12   acetaminophen (TYLENOL) 500 MG tablet Take 2 tablets (1,000 mg total) by mouth daily as needed. Home med. 30 tablet 0   albuterol (PROVENTIL) (2.5 MG/3ML) 0.083% nebulizer solution Take 2.5 mg by nebulization every 4 (four) hours as needed for wheezing or shortness of breath.     albuterol (VENTOLIN HFA) 108 (90 Base) MCG/ACT inhaler SMARTSIG:2 inhalation Via Inhaler Every 6 Hours PRN 1 each 2   blood glucose meter kit and supplies KIT Check your blood glucose levels twice a day; once in the morning before breakfast; and once in the evening after dinner. 1 each 0   BREZTRI AEROSPHERE 160-9-4.8 MCG/ACT AERO Inhale 2 puffs into the lungs 2 (two) times daily.     dextromethorphan-guaiFENesin (MUCINEX DM) 30-600 MG 12hr tablet Take 1 tablet by mouth 2 (two) times daily as needed for cough.     dorzolamide-timolol (COSOPT) 22.3-6.8 MG/ML ophthalmic solution Place 1 drop into both eyes 2 (two) times  daily.     eszopiclone (LUNESTA) 1 MG TABS tablet TAKE 1 TABLET(1 MG) BY MOUTH AT BEDTIME AS NEEDED FOR SLEEP 30 tablet 3   fentaNYL (DURAGESIC) 50 MCG/HR Place 1 patch onto the skin every 3 (three) days. 10 patch 0   glipiZIDE (GLUCOTROL) 5 MG tablet TAKE 1 TABLET(5 MG) BY MOUTH DAILY BEFORE BREAKFAST (Patient not  taking: Reported on 05/17/2023) 90 tablet 2   glucose blood test strip Check your blood glucose levels twice a day; once in the morning before breakfast; and once in the evening after dinner. 100 each 12   hydrOXYzine (ATARAX) 10 MG tablet Take 1 tablet (10 mg total) by mouth at bedtime as needed. 30 tablet 3   ipratropium-albuterol (DUONEB) 0.5-2.5 (3) MG/3ML SOLN Take 3 mLs by nebulization every 6 (six) hours as needed. 360 mL 1   latanoprost (XALATAN) 0.005 % ophthalmic solution Place 1 drop into both eyes at bedtime.     lidocaine-prilocaine (EMLA) cream Apply 1 Application topically as needed. 30 g 3   metFORMIN (GLUCOPHAGE) 500 MG tablet Take 1 tablet (500 mg total) by mouth 2 (two) times daily with a meal. (Patient not taking: Reported on 05/17/2023) 60 tablet 2   montelukast (SINGULAIR) 10 MG tablet TAKE 1 TABLET(10 MG) BY MOUTH AT BEDTIME 90 tablet 0   Multiple Vitamin (MULTIVITAMIN WITH MINERALS) TABS tablet Take 1 tablet by mouth daily.     naloxone (NARCAN) nasal spray 4 mg/0.1 mL Place 1 spray into the nose once. (Patient not taking: Reported on 05/17/2023)     olmesartan (BENICAR) 40 MG tablet Take 1 tablet (40 mg total) by mouth every morning. (Patient not taking: Reported on 02/22/2023) 30 tablet 3   oxyCODONE (OXY IR/ROXICODONE) 5 MG immediate release tablet Take 1 tablet (5 mg total) by mouth every 4 (four) hours as needed for severe pain (pain score 7-10). Take 1-2 pills every 4- 6 hours as needed for moderate to severe pain 90 tablet 0   potassium chloride SA (KLOR-CON M) 20 MEQ tablet TAKE 1 TABLET(20 MEQ) BY MOUTH DAILY 30 tablet 1   potassium chloride SA (KLOR-CON M) 20  MEQ tablet TAKE 1 TABLET(20 MEQ) BY MOUTH DAILY 30 tablet 1   predniSONE (DELTASONE) 20 MG tablet Take 1 tablet (20 mg total) by mouth daily with breakfast. 30 tablet 0   pregabalin (LYRICA) 50 MG capsule Take 1 capsule (50 mg total) by mouth 2 (two) times daily. 60 capsule 2   prochlorperazine (COMPAZINE) 10 MG tablet TAKE 1 TABLET(10 MG) BY MOUTH EVERY 6 HOURS AS NEEDED FOR NAUSEA OR VOMITING 30 tablet 1   propranolol ER (INDERAL LA) 60 MG 24 hr capsule TAKE 1 CAPSULE(60 MG) BY MOUTH DAILY 30 capsule 3   Respiratory Therapy Supplies (NEBULIZER) DEVI Use as directed 1 each 0   tamsulosin (FLOMAX) 0.4 MG CAPS capsule Take 0.4 mg by mouth daily after supper.     triamcinolone ointment (KENALOG) 0.5 % Apply 1 Application topically 2 (two) times daily. 30 g 1   venlafaxine XR (EFFEXOR-XR) 75 MG 24 hr capsule Take 225 mg by mouth daily with breakfast.     vitamin B-12 (CYANOCOBALAMIN) 500 MCG tablet Take 1 tablet by mouth daily.     No current facility-administered medications for this visit.   Facility-Administered Medications Ordered in Other Visits  Medication Dose Route Frequency Provider Last Rate Last Admin   heparin lock flush 100 UNIT/ML injection            heparin lock flush 100 UNIT/ML injection            heparin lock flush 100 UNIT/ML injection             PHYSICAL EXAMINATION: ECOG PERFORMANCE STATUS: 1 - Symptomatic but completely ambulatory  Vitals:   08/19/23 1019  BP: 105/80  Pulse: (!) 105  Temp: 97.6 F (  36.4 C)  SpO2: 100%    Filed Weights   08/19/23 1019  Weight: 153 lb (69.4 kg)     Physical Exam Vitals and nursing note reviewed.  HENT:     Head: Normocephalic and atraumatic.     Mouth/Throat:     Pharynx: Oropharynx is clear.  Eyes:     Extraocular Movements: Extraocular movements intact.     Pupils: Pupils are equal, round, and reactive to light.  Cardiovascular:     Rate and Rhythm: Normal rate and regular rhythm.  Pulmonary:     Comments:  Decreased breath sounds bilaterally.  Abdominal:     Palpations: Abdomen is soft.  Musculoskeletal:        General: Normal range of motion.     Cervical back: Normal range of motion.  Skin:    General: Skin is warm.  Neurological:     General: No focal deficit present.     Mental Status: He is alert and oriented to person, place, and time.  Psychiatric:        Behavior: Behavior normal.        Judgment: Judgment normal.    LABORATORY DATA:  I have reviewed the data as listed Lab Results  Component Value Date   WBC 9.7 08/19/2023   HGB 13.2 08/19/2023   HCT 40.3 08/19/2023   MCV 87.8 08/19/2023   PLT 151 08/19/2023   Recent Labs    07/12/23 1007 07/26/23 0903 08/19/23 0937  NA 136 137 136  K 3.2* 3.4* 3.3*  CL 101 101 99  CO2 25 26 25   GLUCOSE 224* 227* 236*  BUN 12 9 14   CREATININE 1.23 1.20 1.16  CALCIUM 8.6* 8.9 8.8*  GFRNONAA >60 >60 >60  PROT 6.8 7.2 6.6  ALBUMIN 3.5 3.5 3.4*  AST 15 18 17   ALT 7 7 15   ALKPHOS 101 107 104  BILITOT 0.3 0.4 0.3    RADIOGRAPHIC STUDIES: I have personally reviewed the radiological images as listed and agreed with the findings in the report. NM PET Image Restag (PS) Skull Base To Thigh  Result Date: 08/16/2023 CLINICAL DATA:  Subsequent treatment strategy for RIGHT lung carcinoma. Unresectable lung carcinoma chest wall invasion. EXAM: NUCLEAR MEDICINE PET SKULL BASE TO THIGH TECHNIQUE: 8.6 mCi F-18 FDG was injected intravenously. Full-ring PET imaging was performed from the skull base to thigh after the radiotracer. CT data was obtained and used for attenuation correction and anatomic localization. Fasting blood glucose: 112 mg/dl COMPARISON:  PET-CT scan 09/22/2022, CT scan 06/20/2023 FINDINGS: NECK: No hypermetabolic lymph nodes in the neck. Incidental CT findings: None. CHEST: Rim of hypermetabolic activity associated with the RIGHT upper lobe cavitary process is increased now with circumferential hypermetabolic activity  compared to intermittent hypermetabolic activity on comparison PET-CT scan. Activity is intense along the RIGHT lateral chest wall with SUV max equal 11.3. There is evidence of chest wall invasion associated hypermetabolic activity with erosion multiple RIGHT lateral ribs including the third through sixth ribs. The rib invasion is new from comparison PET-CT scan. Hypermetabolic LEFT supra hilar mass along the mediastinal pleural surface with SUV max equal 9.8 compared SUV max equal 8.2. Mass appears slightly thickened compared to prior measuring 30 mm x 23 mm compared to 33 mm x 20 mm. Several new hypermetabolic prevascular lymph nodes. For example cluster of small nodes along the aortic arch with SUV max equal 4.0 on image 52. New hypermetabolic lymph node adjacent to the descending thoracic aorta measuring 9 mm image  51/6 with SUV max equal 6.1. Incidental CT findings: New focus of metabolic activity within the sternum with SUV max equal 6.0 on image 181 a subtle sclerotic lesion on CT portion exam. ABDOMEN/PELVIS: No hepatic metastasis. Adrenal glands normal. RIGHT adrenal gland adenoma again noted. No hypermetabolic lymph nodes in the abdomen pelvis. Again demonstrated hypermetabolic mass at the cecum measuring 4.2 cm (image 133) with SUV max equal 9.7. Lesion measured 3.6 cm on comparison PET-CT scan similar hypermetabolic activity. Incidental CT findings: None. SKELETON: Single hypermetabolic sternal metastasis described in chest section. Direct chest wall invasion described in chest section. No additional skeletal metastasis Incidental CT findings: None. IMPRESSION: 1. Increased hypermetabolic activity associated with the RIGHT upper lobe cavitary process. Intense hypermetabolic activity along the RIGHT lateral chest wall with evidence of chest wall invasion and erosion of multiple RIGHT lateral ribs. Findings consistent with local progression of lung carcinoma. 2. New hypermetabolic prevascular mediastinal  lymph nodes consistent with nodal metastasis. 3. New hypermetabolic sternal metastasis. 4. Similar hypermetabolic LEFT suprahilar mass consistent metastatic disease. 5. Slight increased size of hypermetabolic mass at the cecum. Electronically Signed   By: Genevive Bi M.D.   On: 08/16/2023 12:08   DG Ribs Bilateral W/Chest  Result Date: 08/05/2023 CLINICAL DATA:  Pain in right side near axilla, history of primary cancer of right upper lobe of lung EXAM: BILATERAL RIBS AND CHEST - 4+ VIEW COMPARISON:  None Available. FINDINGS: Displaced fracture of the right sixth rib, with redemonstrated lucency of the third, fourth, and fifth ribs laterally. The displacement appears new compared to the 01/20/2023 radiograph and is likely new from the 06/20/2023 chest CT. Remote deformity the lateral right seventh rib. Left chest port with catheter tip in the low SVC. Redemonstrated cavitary mass in the right apex. No definite new focal pulmonary opacity. No pleural effusion or definite pneumothorax. IMPRESSION: 1. Displaced fracture of the right sixth rib, likely a pathologic fracture, with redemonstrated lytic lesions in the right third, fourth, and fifth ribs. The displacement appears new compared to the 01/20/2023 radiograph and is likely new from the 06/20/2023 chest CT. 2. Redemonstrated cavitary mass in the right apex. Electronically Signed   By: Wiliam Ke M.D.   On: 08/05/2023 12:59    ASSESSMENT & PLAN:   Primary cancer of right upper lobe of lung (HCC) # UNRESECTABLE/LOCALLY ADVANCED- Right upper lobe lung mass -concerning for malignancy [biopsy inconclusive/atypical cells]-  Currently OFF therapy/on surveillance [given poor tolerance to Keytruda].  Unfortunately NOV 12th, PET scan:  Increased hypermetabolic activity associated with the RIGHT upper lobe cavitary process. Intense hypermetabolic activity along the RIGHT lateral chest wall with evidence of chest wall invasion and erosion of multiple RIGHT  lateral ribs. Findings consistent with local progression of lung carcinoma. New hypermetabolic prevascular mediastinal lymph nodes consistent with nodal metastasis. New hypermetabolic sternal metastasis. Similar hypermetabolic LEFT suprahilar mass consistent metastatic disease; Slight increased size of hypermetabolic mass at the cecum.  # I reviewed progression noted as noted on the PET scan.  Patient right still declining any systemic therapy which I think is reasonable given his poor tolerance.  Recommend hospice see below   #Right chest wall pain-/pleuritic-secondary to progressive malignancy.  Increase fentanyl to 50 mcg and also increase oxycodone 5 mg 1 to 2 pills every 4-6 hours.  New prescription sent.  # COPD/fatigue- [coughing/phlegm/ no fevers]- on albuterol/advair [smoking]- TSH- WNL;Continue  Xopenex/ipratropium nebs-; Mucinex- DM BID-continue prednisone for appetite/Zithromax as per pulmonary  # Hypokalemia-3.4 - continue potassium 20  mEq liquid.-Hypocalemia: 8.4/albumin- 3.2. MARCH 2024-vit D 105- HOLD off -Vit D 50,K. Stable.   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 Lemar Livings [Dr.[facility]] PET scan slight progression asymptomatic at this time continue monitoring.  # Labile Blood glucose- BG as low as 25; taken off glipizide-  Metformin [PCP]- 228-stable.  Monitor closely on steroids prednisone 20 mg a day.  # Ongoing fatigue: Poor appetite continue prednisone 20 mg a day.   # IV mediport: functioning/stable.   # Insomnia-continue Lunesta refilled- stable.    # I had a long discussion with the patient and family given the incurable nature of the disease /poor tolerance of therapy.  I also discussed given general less than 6 months of life expectancy, I introduced hospice philosophy to the patient and family.    Discussed that goal of care should be  directed to symptom management rather than treating the underlying disease; and in the process help improve quality of life rather than quantity.  Discussed with hospice team would include-nurse, nurse aide, social worker and chaplain for help take care of patient with physical/emotional needs. Recommend await evaluation with hospice.  Also discussed with Dr. Windy Fast.  *appt thru mychart   # DISPOSITION: # hospice referral. # De-access-NO IVFs or KCL # follow up in 4 week Tuesday- MD; labs- cbc/cmp; possible  IVFs-1 lit /1 hour;KCL 20 IV-  Dr.B   All questions were answered. The patient knows to call the clinic with any problems, questions or concerns.    [name], MD 08/19/2023 12:01 PM

## 2023-08-19 NOTE — Assessment & Plan Note (Addendum)
#   UNRESECTABLE/LOCALLY ADVANCED- Right upper lobe lung mass -concerning for malignancy [biopsy inconclusive/atypical cells]-  Currently OFF therapy/on surveillance [given poor tolerance to Keytruda].  Unfortunately NOV 12th, PET scan:  Increased hypermetabolic activity associated with the RIGHT upper lobe cavitary process. Intense hypermetabolic activity along the RIGHT lateral chest wall with evidence of chest wall invasion and erosion of multiple RIGHT lateral ribs. Findings consistent with local progression of lung carcinoma. New hypermetabolic prevascular mediastinal lymph nodes consistent with nodal metastasis. New hypermetabolic sternal metastasis. Similar hypermetabolic LEFT suprahilar mass consistent metastatic disease; Slight increased size of hypermetabolic mass at the cecum.  # I reviewed progression noted as noted on the PET scan.  Patient right still declining any systemic therapy which I think is reasonable given his poor tolerance.  Recommend hospice see below   #Right chest wall pain-/pleuritic-secondary to progressive malignancy.  Increase fentanyl to 50 mcg and also increase oxycodone 5 mg 1 to 2 pills every 4-6 hours.  New prescription sent.  # COPD/fatigue- [coughing/phlegm/ no fevers]- on albuterol/advair [smoking]- TSH- WNL;Continue  Xopenex/ipratropium nebs-; Mucinex- DM BID-continue prednisone for appetite/Zithromax as per pulmonary  # Hypokalemia-3.4 - continue potassium 20 mEq liquid.-Hypocalemia: 8.4/albumin- 3.2. MARCH 2024-vit D 105- HOLD off -Vit D 50,K. Stable.   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 Lemar Livings [Dr.[facility]] PET scan slight progression asymptomatic at this time continue monitoring.  # Labile Blood glucose- BG as low as 25; taken off glipizide-  Metformin [PCP]- 228-stable.  Monitor closely on steroids prednisone 20 mg a day.  #  Ongoing fatigue: Poor appetite continue prednisone 20 mg a day.   # IV mediport: functioning/stable.   # Insomnia-continue Lunesta refilled- stable.    # I had a long discussion with the patient and family given the incurable nature of the disease /poor tolerance of therapy.  I also discussed given general less than 6 months of life expectancy, I introduced hospice philosophy to the patient and family.    Discussed that goal of care should be directed to symptom management rather than treating the underlying disease; and in the process help improve quality of life rather than quantity.  Discussed with hospice team would include-nurse, nurse aide, social worker and chaplain for help take care of patient with physical/emotional needs. Recommend await evaluation with hospice.  Also discussed with Dr. Windy Fast.  *appt thru mychart   # DISPOSITION: # hospice referral. # De-access-NO IVFs or KCL # follow up in 4 week Tuesday- MD; labs- cbc/cmp; possible  IVFs-1 lit /1 hour;KCL 20 IV-  Dr.B

## 2023-08-19 NOTE — Progress Notes (Signed)
Palliative Medicine [facility]  [phone] [phone]  Patient Care Team: [name], MD as PCP - General (Family Medicine) [name], RN as Oncology Nurse Navigator Orson Ape, MD as Consulting Physician (Urology) [name], MD as Consulting Physician (Oncology) [name], MD as Consulting Physician (Pulmonary Disease)   Name of the patient: Rodney Vega  [id number]  1956-05-05   Date of visit: 08/19/23  Reason for Consult: Rodney Vega is a 67 year old man with multiple medical problems including recurrent unresectable locally advanced non-small cell lung cancer status post chemotherapy and radiation.  Most recently on surveillance given poor tolerance for immunotherapy.   PAST MEDICAL HISTORY: Past Medical History:  Diagnosis Date   Acute on chronic respiratory failure (HCC)    Anemia    Cancer (HCC)    Colostomy present (HCC)    COPD (chronic obstructive pulmonary disease) (HCC)    Degenerative arthritis of knee    Depression    Diabetes mellitus without complication (HCC)    Diverticulitis    Dyspnea    Glaucoma    since 2004   Hernia 1979   History of kidney stones    History of methicillin resistant staphylococcus aureus (MRSA)    Hypertension    Hypokalemia    Marijuana use    Mass of cecum    Nephrolithiasis    Neuromuscular disorder (HCC)    Personal history of tobacco use, presenting hazards to health    Pneumonia    Port-A-Cath in place    Pre-diabetes    Primary cancer of right upper lobe of lung (HCC)    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)    Screening for obesity    Severe sepsis (HCC)    Special screening for malignant neoplasms, colon    Tobacco use    Wears dentures    full upper and lower    PAST SURGICAL HISTORY:  Past Surgical History:  Procedure Laterality Date   BACK SURGERY  1994, 2012   lumbar bulging disc   BRONCHOSCOPY  05/05/2022   CATARACT  EXTRACTION W/PHACO Left 01/20/2021   Procedure: CATARACT EXTRACTION PHACO AND INTRAOCULAR LENS PLACEMENT (IOC) LEFT;  Surgeon: Galen Manila, MD;  Location: [facility];  Service: Ophthalmology;  Laterality: Left;  10.13 0:52.1   COLONOSCOPY  [id number]   [facility]/Dr. Carmell Austria sessile polyp,80mm, found in rectum,multiple diverticula were found in the sigmoid, in descending and in transverse colon   COLONOSCOPY WITH PROPOFOL N/A 10/22/2021   Procedure: COLONOSCOPY WITH PROPOFOL;  Surgeon: Jaynie Collins, DO;  Location: [facility] ENDOSCOPY;  Service: Gastroenterology;  Laterality: N/A;  DM   COLOSTOMY  04/20/2013   EYE SURGERY     HERNIA REPAIR  1979   inguinal   IR IMAGING GUIDED PORT INSERTION  06/10/2021   JOINT REPLACEMENT Right    knee   KNEE ARTHROPLASTY Right 05/08/2018   Procedure: COMPUTER ASSISTED TOTAL KNEE ARTHROPLASTY;  Surgeon: Donato Heinz, MD;  Location: [facility];  Service: Orthopedics;  Laterality: Right;   KNEE ARTHROPLASTY Left 11/16/2019   Procedure: COMPUTER ASSISTED TOTAL KNEE ARTHROPLASTY;  Surgeon: Donato Heinz, MD;  Location: [facility];  Service: Orthopedics;  Laterality: Left;   LAPAROTOMY  04/20/2013   colon resection with colosotmy   LITHOTRIPSY  2013   LUNG BIOPSY  2023   RIGID BRONCHOSCOPY N/A 10/29/2022   Procedure: RIGID BRONCHOSCOPY;  Surgeon: [name], MD;  Location: [facility];  Service: Thoracic;  Laterality: N/A;  TONSILLECTOMY AND ADENOIDECTOMY  1962   VIDEO BRONCHOSCOPY WITH ENDOBRONCHIAL NAVIGATION N/A 05/05/2022   Procedure: VIDEO BRONCHOSCOPY WITH ENDOBRONCHIAL NAVIGATION;  Surgeon: [name], MD;  Location: [facility];  Service: Thoracic;  Laterality: N/A;   VIDEO BRONCHOSCOPY WITH ENDOBRONCHIAL ULTRASOUND N/A 05/05/2022   Procedure: VIDEO BRONCHOSCOPY WITH ENDOBRONCHIAL ULTRASOUND;  Surgeon: [name], MD;  Location: [facility];  Service: Thoracic;  Laterality: N/A;   VIDEO BRONCHOSCOPY WITH ENDOBRONCHIAL ULTRASOUND N/A  10/29/2022   Procedure: VIDEO BRONCHOSCOPY WITH ENDOBRONCHIAL ULTRASOUND;  Surgeon: [name], MD;  Location: [facility];  Service: Thoracic;  Laterality: N/A;    HEMATOLOGY/ONCOLOGY HISTORY:  Oncology History Overview Note  AUG 2022- 8.2 x 5.9 cm spiculated mass noted in right upper lobe consistent with malignancy. It extends from the right hilum to the lateral chest wall and results in lytic destruction of the lateral portion of the right fourth rib.  Mediastinal lymph nodes are noted, including 12 mm right hilar lymph node, consistent with metastatic disease. 3 cm right adrenal mass is noted concerning for metastatic disease.  # AUG 2022- PET IMPRESSION: Peripherally hypermetabolic right upper lobe mass, likely due to central necrosis. Mass extends into the right hilum and right lateral chest wall involving the second through fourth right ribs.   Mildly enlarged and hypermetabolic right hilar lymph node.   No evidence of metastatic disease in the abdomen or pelvis.  # AUG, 2022-RIGHT CHEST WALL Bx-necrosis; suspicious for malignancy not definitive [discussed with Dr.Kraynie;MDT] LUNG MASS, RIGHT; BIOPSY:  - SUSPICIOUS FOR MALIGNANCY.  - SEE COMMENT.   Comment:  Approximately six tissue cores demonstrate inflamed fibrous capsule with  hemosiderin-laden macrophages, in a background of abundant necrosis.  One of the cores demonstrates a minute fragment of viable epithelial  cells.  These cells are positive for p40.  They are negative for TTF1  and CK7. The cells of interest disappear on deeper cut levels.  While  the findings are suspicious for squamous cell carcinoma, there is not  enough viable tumor for a definitive diagnosis.  Additional tissue  sampling may be helpful.   # SEP 2022-cycle #2 Taxol hypersensitive reaction.  Switched over to #3 cycle- carbo-Abraxane starting 06/29/2021.  Discontinued IMFINZI therapy-December 2022 given poor tolerance/pneumonia admission to  hospital.  #  [name] was in May-June 2023 x3 doses; stopped because of intolerance-fatigue patient preference/lack of progression.  # JAN 2024-bronchoscopy biopsy negative for malignancy; however PET scan concerning for recurrence/progression.    May 05, 2022-s/p bronchoscopy- with Dr.Aleskerov-significant purulent/necrotic debris noted. Dec 23rd, 2023- PET scan: The cavitary process in the right upper lobe is similar in size to previous CTS, although demonstrates prominent peripheral hypermetabolic activity. 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 Hypermetabolic activity within the adjacent upper right lateral chest wall with right 3rd through 5th rib deformities, suspicious for direct tumor spread. No evidence of distant osseous metastatic disease; Hypermetabolic left hilar lymph node worrisome for metastatic disease. No other hypermetabolic lymph nodes or distant metastases identified.  Bronchoscopy/EBUS [JAN 26th, 2024- Dr.A]- BAL/ 4R-lymph node FNA negative for malignancy.  Patient declined further invasive procedures.  # FEB 15t, 2024- RE-Start Keytruda.   # DEC 2022- s/p ID- Dr.ravishankar- ? Lung abscess-no antibiotics-  JAN 2023- CT scan incidental- cecal mass- colonoscopy[Dr.Russo; [facility]- high grade dysplasia]  S/p evaluation with Dr. Sherryle Lis surgery for now]   # S/p evaluation with Dr. Sheppard Penton.   Primary cancer  of right upper lobe of lung (HCC)  06/03/2021 Initial Diagnosis   Primary cancer of right upper lobe of lung (HCC)   06/03/2021 Cancer Staging   Staging form: Lung, AJCC 8th Edition - Clinical: Stage IIIB (cT3, cN2, cM0) - Signed by [name], MD on 06/03/2021   06/15/2021 - 08/03/2021 Chemotherapy   Patient is on Treatment Plan : LUNG Carboplatin / Paclitaxel + XRT q7d     10/02/2021 - 10/19/2021 Chemotherapy   Patient is on  Treatment Plan : LUNG Durvalumab q14d     02/03/2022 -  Chemotherapy   Patient is on Treatment Plan : LUNG NSCLC Pembrolizumab (200) q21d     02/04/2022 - 03/18/2022 Chemotherapy   Patient is on Treatment Plan : LUNG NSCLC Pembrolizumab (200) q21d       ALLERGIES:  is allergic to paclitaxel, ambien [zolpidem], nsaids, and aleve [naproxen sodium].  MEDICATIONS:  Current Outpatient Medications  Medication Sig Dispense Refill   Accu-Chek Softclix Lancets lancets USE AS DIRECTED FOUR TIMES DAILY 100 each 12   acetaminophen (TYLENOL) 500 MG tablet Take 2 tablets (1,000 mg total) by mouth daily as needed. Home med. 30 tablet 0   albuterol (PROVENTIL) (2.5 MG/3ML) 0.083% nebulizer solution Take 2.5 mg by nebulization every 4 (four) hours as needed for wheezing or shortness of breath.     albuterol (VENTOLIN HFA) 108 (90 Base) MCG/ACT inhaler SMARTSIG:2 inhalation Via Inhaler Every 6 Hours PRN 1 each 2   azithromycin (ZITHROMAX) 250 MG tablet Take by mouth.     blood glucose meter kit and supplies KIT Check your blood glucose levels twice a day; once in the morning before breakfast; and once in the evening after dinner. 1 each 0   BREZTRI AEROSPHERE 160-9-4.8 MCG/ACT AERO Inhale 2 puffs into the lungs 2 (two) times daily.     dextromethorphan-guaiFENesin (MUCINEX DM) 30-600 MG 12hr tablet Take 1 tablet by mouth 2 (two) times daily as needed for cough.     dorzolamide-timolol (COSOPT) 22.3-6.8 MG/ML ophthalmic solution Place 1 drop into both eyes 2 (two) times daily.     eszopiclone (LUNESTA) 1 MG TABS tablet TAKE 1 TABLET(1 MG) BY MOUTH AT BEDTIME AS NEEDED FOR SLEEP 30 tablet 3   fentaNYL (DURAGESIC) 50 MCG/HR Place 1 patch onto the skin every 3 (three) days. 10 patch 0   glipiZIDE (GLUCOTROL) 5 MG tablet TAKE 1 TABLET(5 MG) BY MOUTH DAILY BEFORE BREAKFAST (Patient not taking: Reported on 05/17/2023) 90 tablet 2   glucose blood test strip Check your blood glucose levels twice a day; once in the morning  before breakfast; and once in the evening after dinner. 100 each 12   hydrOXYzine (ATARAX) 10 MG tablet Take 1 tablet (10 mg total) by mouth at bedtime as needed. 30 tablet 3   ipratropium-albuterol (DUONEB) 0.5-2.5 (3) MG/3ML SOLN Take 3 mLs by nebulization every 6 (six) hours as needed. 360 mL 1   latanoprost (XALATAN) 0.005 % ophthalmic solution Place 1 drop into both eyes at bedtime.     lidocaine-prilocaine (EMLA) cream Apply 1 Application topically as needed. 30 g 3   metFORMIN (GLUCOPHAGE) 500 MG tablet Take 1 tablet (500 mg total) by mouth 2 (two) times daily with a meal. (Patient not taking: Reported on 05/17/2023) 60 tablet 2   montelukast (SINGULAIR) 10 MG tablet TAKE 1 TABLET(10 MG) BY MOUTH AT BEDTIME 90 tablet 0   Multiple Vitamin (MULTIVITAMIN WITH MINERALS) TABS tablet Take 1 tablet by mouth daily.  naloxone (NARCAN) nasal spray 4 mg/0.1 mL Place 1 spray into the nose once. (Patient not taking: Reported on 05/17/2023)     olmesartan (BENICAR) 40 MG tablet Take 1 tablet (40 mg total) by mouth every morning. (Patient not taking: Reported on 02/22/2023) 30 tablet 3   oxyCODONE (OXY IR/ROXICODONE) 5 MG immediate release tablet Take 1 tablet (5 mg total) by mouth every 4 (four) hours as needed for severe pain (pain score 7-10). Take 1-2 pills every 4- 6 hours as needed for moderate to severe pain 90 tablet 0   potassium chloride SA (KLOR-CON M) 20 MEQ tablet TAKE 1 TABLET(20 MEQ) BY MOUTH DAILY 30 tablet 1   potassium chloride SA (KLOR-CON M) 20 MEQ tablet TAKE 1 TABLET(20 MEQ) BY MOUTH DAILY 30 tablet 1   predniSONE (DELTASONE) 20 MG tablet Take 1 tablet (20 mg total) by mouth daily with breakfast. 30 tablet 0   pregabalin (LYRICA) 50 MG capsule Take 1 capsule (50 mg total) by mouth 2 (two) times daily. 60 capsule 2   prochlorperazine (COMPAZINE) 10 MG tablet TAKE 1 TABLET(10 MG) BY MOUTH EVERY 6 HOURS AS NEEDED FOR NAUSEA OR VOMITING 30 tablet 1   propranolol ER (INDERAL LA) 60 MG 24 hr  capsule TAKE 1 CAPSULE(60 MG) BY MOUTH DAILY 30 capsule 3   Respiratory Therapy Supplies (NEBULIZER) DEVI Use as directed 1 each 0   tamsulosin (FLOMAX) 0.4 MG CAPS capsule Take 0.4 mg by mouth daily after supper.     triamcinolone ointment (KENALOG) 0.5 % Apply 1 Application topically 2 (two) times daily. 30 g 1   venlafaxine XR (EFFEXOR-XR) 75 MG 24 hr capsule Take 225 mg by mouth daily with breakfast.     vitamin B-12 (CYANOCOBALAMIN) 500 MCG tablet Take 1 tablet by mouth daily.     No current facility-administered medications for this visit.   Facility-Administered Medications Ordered in Other Visits  Medication Dose Route Frequency Provider Last Rate Last Admin   heparin lock flush 100 UNIT/ML injection            heparin lock flush 100 UNIT/ML injection            heparin lock flush 100 UNIT/ML injection             VITAL SIGNS: There were no vitals taken for this visit. There were no vitals filed for this visit.    Estimated body mass index is 21.34 kg/m as calculated from the following:   Height as of 08/11/23: 5\' 11"  (1.803 m).   Weight as of an earlier encounter on 08/19/23: 153 lb (69.4 kg).  LABS: CBC:    Component Value Date/Time   WBC 9.7 08/19/2023 0937   WBC 7.1 01/25/2023 0939   HGB 13.2 08/19/2023 0937   HGB 7.5 (L) 12/01/2021 0504   HCT 40.3 08/19/2023 0937   HCT 23.7 (L) 12/01/2021 0504   PLT 151 08/19/2023 0937   PLT 243 12/01/2021 0504   MCV 87.8 08/19/2023 0937   MCV 89 12/01/2021 0504   MCV 91 09/21/2013 0522   NEUTROABS 7.5 08/19/2023 0937   NEUTROABS 9.6 (H) 12/01/2021 0504   NEUTROABS 11.3 (H) 09/21/2013 0522   LYMPHSABS 1.4 08/19/2023 0937   LYMPHSABS 0.8 12/01/2021 0504   LYMPHSABS 1.4 09/21/2013 0522   MONOABS 0.5 08/19/2023 0937   MONOABS 0.9 09/21/2013 0522   EOSABS 0.2 08/19/2023 0937   EOSABS 0.3 12/01/2021 0504   EOSABS 0.0 09/21/2013 0522   BASOSABS 0.1 08/19/2023 6387  BASOSABS 0.0 12/01/2021 0504   BASOSABS 0.1 09/21/2013  0522   Comprehensive Metabolic Panel:    Component Value Date/Time   NA 136 08/19/2023 0937   NA 138 09/20/2013 0639   K 3.3 (L) 08/19/2023 0937   K 4.0 09/20/2013 0639   CL 99 08/19/2023 0937   CL 107 09/20/2013 0639   CO2 25 08/19/2023 0937   CO2 26 09/20/2013 0639   BUN 14 08/19/2023 0937   BUN 12 09/20/2013 0639   CREATININE 1.16 08/19/2023 0937   CREATININE 0.84 09/24/2013 0515   GLUCOSE 236 (H) 08/19/2023 0937   GLUCOSE 160 (H) 09/20/2013 0639   CALCIUM 8.8 (L) 08/19/2023 0937   CALCIUM 8.2 (L) 09/20/2013 0639   AST 17 08/19/2023 0937   ALT 15 08/19/2023 0937   ALT 20 01/08/2012 0939   ALKPHOS 104 08/19/2023 0937   ALKPHOS 69 01/08/2012 0939   BILITOT 0.3 08/19/2023 0937   PROT 6.6 08/19/2023 0937   PROT 6.5 09/12/2013 1002   PROT 6.9 01/08/2012 0939   ALBUMIN 3.4 (L) 08/19/2023 0937   ALBUMIN 4.4 09/12/2013 1002   ALBUMIN 3.5 01/08/2012 0939    RADIOGRAPHIC STUDIES: NM PET Image Restag (PS) Skull Base To Thigh  Result Date: 08/16/2023 CLINICAL DATA:  Subsequent treatment strategy for RIGHT lung carcinoma. Unresectable lung carcinoma chest wall invasion. EXAM: NUCLEAR MEDICINE PET SKULL BASE TO THIGH TECHNIQUE: 8.6 mCi F-18 FDG was injected intravenously. Full-ring PET imaging was performed from the skull base to thigh after the radiotracer. CT data was obtained and used for attenuation correction and anatomic localization. Fasting blood glucose: 112 mg/dl COMPARISON:  PET-CT scan 09/22/2022, CT scan 06/20/2023 FINDINGS: NECK: No hypermetabolic lymph nodes in the neck. Incidental CT findings: None. CHEST: Rim of hypermetabolic activity associated with the RIGHT upper lobe cavitary process is increased now with circumferential hypermetabolic activity compared to intermittent hypermetabolic activity on comparison PET-CT scan. Activity is intense along the RIGHT lateral chest wall with SUV max equal 11.3. There is evidence of chest wall invasion associated hypermetabolic  activity with erosion multiple RIGHT lateral ribs including the third through sixth ribs. The rib invasion is new from comparison PET-CT scan. Hypermetabolic LEFT supra hilar mass along the mediastinal pleural surface with SUV max equal 9.8 compared SUV max equal 8.2. Mass appears slightly thickened compared to prior measuring 30 mm x 23 mm compared to 33 mm x 20 mm. Several new hypermetabolic prevascular lymph nodes. For example cluster of small nodes along the aortic arch with SUV max equal 4.0 on image 52. New hypermetabolic lymph node adjacent to the descending thoracic aorta measuring 9 mm image 51/6 with SUV max equal 6.1. Incidental CT findings: New focus of metabolic activity within the sternum with SUV max equal 6.0 on image 181 a subtle sclerotic lesion on CT portion exam. ABDOMEN/PELVIS: No hepatic metastasis. Adrenal glands normal. RIGHT adrenal gland adenoma again noted. No hypermetabolic lymph nodes in the abdomen pelvis. Again demonstrated hypermetabolic mass at the cecum measuring 4.2 cm (image 133) with SUV max equal 9.7. Lesion measured 3.6 cm on comparison PET-CT scan similar hypermetabolic activity. Incidental CT findings: None. SKELETON: Single hypermetabolic sternal metastasis described in chest section. Direct chest wall invasion described in chest section. No additional skeletal metastasis Incidental CT findings: None. IMPRESSION: 1. Increased hypermetabolic activity associated with the RIGHT upper lobe cavitary process. Intense hypermetabolic activity along the RIGHT lateral chest wall with evidence of chest wall invasion and erosion of multiple RIGHT lateral ribs. Findings consistent with  local progression of lung carcinoma. 2. New hypermetabolic prevascular mediastinal lymph nodes consistent with nodal metastasis. 3. New hypermetabolic sternal metastasis. 4. Similar hypermetabolic LEFT suprahilar mass consistent metastatic disease. 5. Slight increased size of hypermetabolic mass at the  cecum. Electronically Signed   By: Genevive Bi M.D.   On: 08/16/2023 12:08   DG Ribs Bilateral W/Chest  Result Date: 08/05/2023 CLINICAL DATA:  Pain in right side near axilla, history of primary cancer of right upper lobe of lung EXAM: BILATERAL RIBS AND CHEST - 4+ VIEW COMPARISON:  None Available. FINDINGS: Displaced fracture of the right sixth rib, with redemonstrated lucency of the third, fourth, and fifth ribs laterally. The displacement appears new compared to the 01/20/2023 radiograph and is likely new from the 06/20/2023 chest CT. Remote deformity the lateral right seventh rib. Left chest port with catheter tip in the low SVC. Redemonstrated cavitary mass in the right apex. No definite new focal pulmonary opacity. No pleural effusion or definite pneumothorax. IMPRESSION: 1. Displaced fracture of the right sixth rib, likely a pathologic fracture, with redemonstrated lytic lesions in the right third, fourth, and fifth ribs. The displacement appears new compared to the 01/20/2023 radiograph and is likely new from the 06/20/2023 chest CT. 2. Redemonstrated cavitary mass in the right apex. Electronically Signed   By: Wiliam Ke M.D.   On: 08/05/2023 12:59    PERFORMANCE STATUS (ECOG) : 1 - Symptomatic but completely ambulatory  Review of Systems Unless otherwise noted, a complete review of systems is negative.  Physical Exam General: NAD Extremities: no edema, no joint deformities Skin: no rashes Neurological: Grossly nonfocal  Assessment and Plan: Unresectable/locally advanced non-small cell lung cancer -on surveillance due to poor tolerance of treatment.  PET scan on 08/16/2023 unfortunately showed progression of right upper lobe mass with invasion and lateral chest wall and ribs in addition to new hypermetabolic mediastinal lymph nodes and sternal metastasis.  Patient saw Dr. Donneta Romberg today and is not recommending further treatment.  Met with patient and wife who are in agreement  with hospice involvement.  Will send hospice referral   Neoplasm pain -pain improved on fentanyl.  Will refill fentanyl/oxycodone today.  Follow-up 1 month  Patient expressed understanding and was in agreement with this plan. He also understands that He can call clinic at any time with any questions, concerns, or complaints.   Thank you for allowing me to participate in the care of this very pleasant patient.   Time Total: 25 minutes  Visit consisted of counseling and education dealing with the complex and emotionally intense issues of symptom management in the setting of serious illness.Greater than 50%  of this time was spent counseling and coordinating care related to the above assessment and plan.  Signed by: Laurette Schimke, PhD, NP-C

## 2023-08-23 ENCOUNTER — Other Ambulatory Visit: Payer: Self-pay | Admitting: Radiation Oncology

## 2023-08-23 ENCOUNTER — Ambulatory Visit: Payer: Medicare PPO

## 2023-08-23 ENCOUNTER — Inpatient Hospital Stay: Payer: Medicare PPO

## 2023-08-23 ENCOUNTER — Other Ambulatory Visit: Payer: Medicare PPO

## 2023-08-23 ENCOUNTER — Inpatient Hospital Stay: Payer: Medicare PPO | Admitting: Internal Medicine

## 2023-08-23 ENCOUNTER — Ambulatory Visit: Admission: RE | Admit: 2023-08-23 | Payer: Medicare PPO | Source: Ambulatory Visit

## 2023-08-23 DIAGNOSIS — C801 Malignant (primary) neoplasm, unspecified: Secondary | ICD-10-CM

## 2023-08-29 ENCOUNTER — Ambulatory Visit: Payer: Medicare PPO

## 2023-08-30 ENCOUNTER — Ambulatory Visit: Payer: Medicare PPO

## 2023-08-31 ENCOUNTER — Ambulatory Visit: Payer: Medicare PPO

## 2023-09-05 ENCOUNTER — Ambulatory Visit: Payer: Medicare PPO

## 2023-09-06 ENCOUNTER — Ambulatory Visit: Payer: Medicare PPO

## 2023-09-07 ENCOUNTER — Ambulatory Visit: Payer: Medicare PPO

## 2023-09-08 ENCOUNTER — Ambulatory Visit: Payer: Medicare PPO

## 2023-09-09 ENCOUNTER — Ambulatory Visit: Payer: Medicare PPO

## 2023-09-11 ENCOUNTER — Other Ambulatory Visit: Payer: Self-pay | Admitting: Internal Medicine

## 2023-09-12 ENCOUNTER — Ambulatory Visit: Payer: Medicare PPO

## 2023-09-12 ENCOUNTER — Encounter: Payer: Self-pay | Admitting: Internal Medicine

## 2023-09-13 ENCOUNTER — Inpatient Hospital Stay: Attending: Internal Medicine

## 2023-09-13 ENCOUNTER — Ambulatory Visit: Payer: Medicare PPO

## 2023-09-13 ENCOUNTER — Inpatient Hospital Stay

## 2023-09-13 ENCOUNTER — Inpatient Hospital Stay (HOSPITAL_BASED_OUTPATIENT_CLINIC_OR_DEPARTMENT_OTHER): Admitting: Internal Medicine

## 2023-09-13 ENCOUNTER — Encounter: Payer: Self-pay | Admitting: Internal Medicine

## 2023-09-13 ENCOUNTER — Inpatient Hospital Stay (HOSPITAL_BASED_OUTPATIENT_CLINIC_OR_DEPARTMENT_OTHER): Admitting: Hospice and Palliative Medicine

## 2023-09-13 VITALS — BP 95/70 | HR 59 | Temp 98.2°F | Ht 71.0 in | Wt 157.0 lb

## 2023-09-13 DIAGNOSIS — Z515 Encounter for palliative care: Secondary | ICD-10-CM | POA: Diagnosis not present

## 2023-09-13 DIAGNOSIS — I10 Essential (primary) hypertension: Secondary | ICD-10-CM | POA: Insufficient documentation

## 2023-09-13 DIAGNOSIS — C7951 Secondary malignant neoplasm of bone: Secondary | ICD-10-CM | POA: Insufficient documentation

## 2023-09-13 DIAGNOSIS — Z7984 Long term (current) use of oral hypoglycemic drugs: Secondary | ICD-10-CM | POA: Diagnosis not present

## 2023-09-13 DIAGNOSIS — C3411 Malignant neoplasm of upper lobe, right bronchus or lung: Secondary | ICD-10-CM

## 2023-09-13 DIAGNOSIS — E876 Hypokalemia: Secondary | ICD-10-CM | POA: Insufficient documentation

## 2023-09-13 DIAGNOSIS — Z79899 Other long term (current) drug therapy: Secondary | ICD-10-CM | POA: Diagnosis not present

## 2023-09-13 DIAGNOSIS — D3501 Benign neoplasm of right adrenal gland: Secondary | ICD-10-CM | POA: Insufficient documentation

## 2023-09-13 DIAGNOSIS — E119 Type 2 diabetes mellitus without complications: Secondary | ICD-10-CM | POA: Diagnosis not present

## 2023-09-13 DIAGNOSIS — Z7952 Long term (current) use of systemic steroids: Secondary | ICD-10-CM | POA: Diagnosis not present

## 2023-09-13 DIAGNOSIS — Z8614 Personal history of Methicillin resistant Staphylococcus aureus infection: Secondary | ICD-10-CM | POA: Insufficient documentation

## 2023-09-13 DIAGNOSIS — J449 Chronic obstructive pulmonary disease, unspecified: Secondary | ICD-10-CM | POA: Insufficient documentation

## 2023-09-13 LAB — CMP (CANCER CENTER ONLY)
ALT: 12 U/L (ref 0–44)
AST: 17 U/L (ref 15–41)
Albumin: 2.9 g/dL — ABNORMAL LOW (ref 3.5–5.0)
Alkaline Phosphatase: 84 U/L (ref 38–126)
Anion gap: 6 (ref 5–15)
BUN: 10 mg/dL (ref 8–23)
CO2: 26 mmol/L (ref 22–32)
Calcium: 7.9 mg/dL — ABNORMAL LOW (ref 8.9–10.3)
Chloride: 108 mmol/L (ref 98–111)
Creatinine: 0.95 mg/dL (ref 0.61–1.24)
GFR, Estimated: >60 mL/min (ref >60)
Glucose, Bld: 180 mg/dL — ABNORMAL HIGH (ref 70–99)
Potassium: 3.5 mmol/L (ref 3.5–5.1)
Sodium: 140 mmol/L (ref 135–145)
Total Bilirubin: 0.4 mg/dL (ref <1.2)
Total Protein: 5.9 g/dL — ABNORMAL LOW (ref 6.5–8.1)

## 2023-09-13 LAB — CBC WITH DIFFERENTIAL (CANCER CENTER ONLY)
Abs Immature Granulocytes: 0.11 10*3/uL — ABNORMAL HIGH (ref 0.00–0.07)
Basophils Absolute: 0.1 10*3/uL (ref 0.0–0.1)
Basophils Relative: 0 %
Eosinophils Absolute: 0.2 10*3/uL (ref 0.0–0.5)
Eosinophils Relative: 1 %
HCT: 38.8 % — ABNORMAL LOW (ref 39.0–52.0)
Hemoglobin: 12.6 g/dL — ABNORMAL LOW (ref 13.0–17.0)
Immature Granulocytes: 1 %
Lymphocytes Relative: 6 %
Lymphs Abs: 0.8 10*3/uL (ref 0.7–4.0)
MCH: 28.8 pg (ref 26.0–34.0)
MCHC: 32.5 g/dL (ref 30.0–36.0)
MCV: 88.6 fL (ref 80.0–100.0)
Monocytes Absolute: 0.7 10*3/uL (ref 0.1–1.0)
Monocytes Relative: 5 %
Neutro Abs: 11.5 10*3/uL — ABNORMAL HIGH (ref 1.7–7.7)
Neutrophils Relative %: 87 %
Platelet Count: 198 10*3/uL (ref 150–400)
RBC: 4.38 MIL/uL (ref 4.22–5.81)
RDW: 15.1 % (ref 11.5–15.5)
WBC Count: 13.4 10*3/uL — ABNORMAL HIGH (ref 4.0–10.5)
nRBC: 0 % (ref 0.0–0.2)

## 2023-09-13 MED ORDER — SODIUM CHLORIDE 0.9% FLUSH
10.0000 mL | Freq: Once | INTRAVENOUS | Status: AC
  Administered 2023-09-13: 10 mL via INTRAVENOUS
  Filled 2023-09-13: qty 10

## 2023-09-13 MED ORDER — PREDNISONE 20 MG PO TABS: 20.0000 mg | ORAL_TABLET | Freq: Every day | ORAL | 0 refills | Status: DC

## 2023-09-13 MED ORDER — HEPARIN SOD (PORK) LOCK FLUSH 100 UNIT/ML IV SOLN
500.0000 [IU] | Freq: Once | INTRAVENOUS | Status: AC
Start: 2023-09-13 — End: 2023-09-13
  Administered 2023-09-13: 500 [IU] via INTRAVENOUS
  Filled 2023-09-13: qty 5

## 2023-09-13 NOTE — Progress Notes (Signed)
[facility] CONSULT NOTE  Patient Care Team: [name], MD as PCP - General (Family Medicine) [name], RN as Oncology Nurse Navigator [name], [name], MD as Consulting Physician (Urology) [name], MD as Consulting Physician (Oncology) [name], MD as Consulting Physician (Pulmonary Disease)  CHIEF COMPLAINTS/PURPOSE OF CONSULTATION: lung cancer    Oncology History Overview Note  AUG 2022- 8.2 x 5.9 cm spiculated mass noted in right upper lobe consistent with malignancy. It extends from the right hilum to the lateral chest wall and results in lytic destruction of the lateral portion of the right fourth rib.  Mediastinal lymph nodes are noted, including 12 mm right hilar lymph node, consistent with metastatic disease. 3 cm right adrenal mass is noted concerning for metastatic disease.  # AUG 2022- PET IMPRESSION: Peripherally hypermetabolic right upper lobe mass, likely due to central necrosis. Mass extends into the right hilum and right lateral chest wall involving the second through fourth right ribs.   Mildly enlarged and hypermetabolic right hilar lymph node.   No evidence of metastatic disease in the abdomen or pelvis.  # AUG, 2022-RIGHT CHEST WALL Bx-necrosis; suspicious for malignancy not definitive [discussed with Dr.Kraynie;MDT] LUNG MASS, RIGHT; BIOPSY:  - SUSPICIOUS FOR MALIGNANCY.  - SEE COMMENT.   Comment:  Approximately six tissue cores demonstrate inflamed fibrous capsule with  hemosiderin-laden macrophages, in a background of abundant necrosis.  One of the cores demonstrates a minute fragment of viable epithelial  cells.  These cells are positive for p40.  They are negative for TTF1  and CK7. The cells of interest disappear on deeper cut levels.  While  the findings are suspicious for squamous cell carcinoma, there is not  enough viable tumor for a definitive diagnosis.  Additional tissue  sampling may be helpful.    # SEP 2022-cycle #2 Taxol hypersensitive reaction.  Switched over to #3 cycle- carbo-Abraxane starting 06/29/2021.  Discontinued IMFINZI therapy-December 2022 given poor tolerance/pneumonia admission to hospital.  #  [name] was in May-June 2023 x3 doses; stopped because of intolerance-fatigue patient preference/lack of progression.  # JAN 2024-bronchoscopy biopsy negative for malignancy; however PET scan concerning for recurrence/progression.    May 05, 2022-s/p bronchoscopy- with Dr.Aleskerov-significant purulent/necrotic debris noted. Dec 23rd, 2023- PET scan: The cavitary process in the right upper lobe is similar in size to previous CTS, although demonstrates prominent peripheral hypermetabolic activity. 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 Hypermetabolic activity within the adjacent upper right lateral chest wall with right 3rd through 5th rib deformities, suspicious for direct tumor spread. No evidence of distant osseous metastatic disease; Hypermetabolic left hilar lymph node worrisome for metastatic disease. No other hypermetabolic lymph nodes or distant metastases identified.  Bronchoscopy/EBUS [JAN 26th, 2024- Dr.A]- BAL/ 4R-lymph node FNA negative for malignancy.  Patient declined further invasive procedures.  # FEB 15t, 2024- RE-Start Keytruda.   # DEC 2022- s/p ID- Dr.ravishankar- ? Lung abscess-no antibiotics-  JAN 2023- CT scan incidental- cecal mass- colonoscopy[Dr.Russo; [facility]- high grade dysplasia]  S/p evaluation with Dr. Sherryle Lis surgery for now]   # S/p evaluation with Dr. Sheppard Penton.   Primary cancer of right upper lobe of lung (HCC)  06/03/2021 Initial Diagnosis   Primary cancer of right upper lobe of lung (HCC)   06/03/2021 Cancer Staging   Staging form: Lung, AJCC 8th Edition - Clinical: Stage IIIB (cT3, cN2, cM0) - Signed by  Louretta Shorten  R, MD on 06/03/2021   06/15/2021 - 08/03/2021 Chemotherapy   Patient is on Treatment Plan : LUNG Carboplatin / Paclitaxel + XRT q7d     10/02/2021 - 10/19/2021 Chemotherapy   Patient is on Treatment Plan : LUNG Durvalumab q14d     02/03/2022 -  Chemotherapy   Patient is on Treatment Plan : LUNG NSCLC Pembrolizumab (200) q21d     02/04/2022 - 03/18/2022 Chemotherapy   Patient is on Treatment Plan : LUNG NSCLC Pembrolizumab (200) q21d      HISTORY OF PRESENTING ILLNESS: Patient is ambulating independently.  Accompanied by wife.  Rodney Vega 67 y.o.  male history of smoking -"lung cancer" [Limited necrotic tissue]-locally advanced unresectable- currently on surveillance [APRIL 2024-Keytruda was discontinued because of poor tolerance/patient preference] ; and Cecal mass-high grade dysplasia intact- with progressive disease on the PET scan is here for a follow up.  Currently on fentanyl patch ; and continue Oxycodone 2-3 a day. But yesterday- as her his wife states he took 6-7 oxycodone's yesterday, having more pain than usual. No pain right now.   appetite 50% normal, comes and goes, no supplement drinks. Constipation, uses apple juice. C/o heartburn, tums.   Sleeps about 18-20 hours per day.    Smokes weed.unfortunately continues to smoke about 1/2  pack cigarettes per day.  Denies any worsening abdominal pain.  No blood in stools black or stools.  Review of Systems  Constitutional:  Positive for malaise/fatigue and weight loss. Negative for chills, diaphoresis and fever.  HENT:  Negative for nosebleeds and sore throat.   Eyes:  Negative for double vision.  Respiratory:  Positive for cough, sputum production and shortness of breath. Negative for hemoptysis and wheezing.   Cardiovascular:  Positive for chest pain. Negative for palpitations, orthopnea and leg swelling.  Gastrointestinal:  Positive for nausea. Negative for abdominal pain, blood in stool, constipation,  diarrhea, heartburn, melena and vomiting.  Genitourinary:  Negative for dysuria, frequency and urgency.  Musculoskeletal:  Negative for back pain and joint pain.  Skin: Negative.  Negative for itching and rash.  Neurological:  Positive for tingling. Negative for dizziness, focal weakness, weakness and headaches.  Endo/Heme/Allergies:  Does not bruise/bleed easily.  Psychiatric/Behavioral:  Negative for depression. The patient is not nervous/anxious and does not have insomnia.      MEDICAL HISTORY:  Past Medical History:  Diagnosis Date   Acute on chronic respiratory failure (HCC)    Anemia    Cancer (HCC)    Colostomy present (HCC)    COPD (chronic obstructive pulmonary disease) (HCC)    Degenerative arthritis of knee    Depression    Diabetes mellitus without complication (HCC)    Diverticulitis    Dyspnea    Glaucoma    since 2004   Hernia 1979   History of kidney stones    History of methicillin resistant staphylococcus aureus (MRSA)    Hypertension    Hypokalemia    Marijuana use    Mass of cecum    Nephrolithiasis    Neuromuscular disorder (HCC)    Personal history of tobacco use, presenting hazards to health    Pneumonia    Port-A-Cath in place    Pre-diabetes    Primary cancer of right upper lobe of lung (HCC)    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;  Service: Ophthalmology;  Laterality: Left;  10.13 0:52.1   COLONOSCOPY  [id number]   [facility]/Dr. Carmell Austria sessile  polyp,10mm, found in rectum,multiple diverticula were found in the sigmoid, in descending and in transverse colon   COLONOSCOPY WITH PROPOFOL N/A 10/22/2021   Procedure: COLONOSCOPY WITH PROPOFOL;  Surgeon: Jaynie Collins, DO;  Location: [facility];  Service: Gastroenterology;  Laterality: N/A;  DM   COLOSTOMY  04/20/2013   EYE SURGERY     HERNIA REPAIR  1979   inguinal   IR IMAGING GUIDED PORT INSERTION  06/10/2021   JOINT REPLACEMENT Right    knee   KNEE ARTHROPLASTY Right 05/08/2018   Procedure: COMPUTER ASSISTED TOTAL KNEE ARTHROPLASTY;  Surgeon: Donato Heinz, MD;  Location: [facility];  Service: Orthopedics;  Laterality: Right;   KNEE ARTHROPLASTY Left 11/16/2019   Procedure: COMPUTER ASSISTED TOTAL KNEE ARTHROPLASTY;  Surgeon: Donato Heinz, MD;  Location: [facility];  Service: Orthopedics;  Laterality: Left;   LAPAROTOMY  04/20/2013   colon resection with colosotmy   LITHOTRIPSY  2013   LUNG BIOPSY  2023   RIGID BRONCHOSCOPY N/A 10/29/2022   Procedure: RIGID BRONCHOSCOPY;  Surgeon: [name], MD;  Location: [facility];  Service: Thoracic;  Laterality: N/A;   TONSILLECTOMY AND ADENOIDECTOMY  1962   VIDEO BRONCHOSCOPY WITH ENDOBRONCHIAL NAVIGATION N/A 05/05/2022   Procedure: VIDEO BRONCHOSCOPY WITH ENDOBRONCHIAL NAVIGATION;  Surgeon: [name], MD;  Location: [facility];  Service: Thoracic;  Laterality: N/A;   VIDEO BRONCHOSCOPY WITH ENDOBRONCHIAL ULTRASOUND N/A 05/05/2022   Procedure: VIDEO BRONCHOSCOPY WITH ENDOBRONCHIAL ULTRASOUND;  Surgeon: [name], MD;  Location: [facility];  Service: Thoracic;  Laterality: N/A;   VIDEO BRONCHOSCOPY WITH ENDOBRONCHIAL ULTRASOUND N/A 10/29/2022   Procedure: VIDEO BRONCHOSCOPY WITH ENDOBRONCHIAL ULTRASOUND;  Surgeon: [name], MD;  Location: [facility];  Service: Thoracic;  Laterality: N/A;    SOCIAL HISTORY: Social History   Socioeconomic History   Marital status: Married    Spouse name: Pegge   Number of children:  Not on file   Years of education: Not on file   Highest education level: Not on file  Occupational History   Not on file  Tobacco Use   Smoking status: Every Day    Current packs/day: 1.00    Average packs/day: 1 pack/day for 30.0 years (30.0 ttl pk-yrs)    Types: Cigarettes   Smokeless tobacco: Never   Tobacco comments:    since age 76 or 30  Vaping Use   Vaping status: Never Used  Substance and Sexual Activity   Alcohol use: Yes    Alcohol/week: 2.0 standard drinks of alcohol    Types: 2 Standard drinks or equivalent per week    Comment: rare   Drug use: Yes    Types: Marijuana    Comment: fentanyl patch prescribed & oxycodone   Sexual activity: Not on file  Other Topics Concern   Not on file  Social History Narrative   [place]; smoking; not much alcohol. Worked for [employer], 2019. Marland Kitchen    Social Determinants of Health   Financial Resource Strain:  Low Risk  (07/20/2023)   Received from [facility]   Overall Financial Resource Strain (CARDIA)    Difficulty of Paying Living Expenses: Not hard at all  Food Insecurity: No Food Insecurity (07/20/2023)   Received from [facility]   Hunger Vital Sign    Worried About Running Out of Food in the Last Year: Never true    Ran Out of Food in the Last Year: Never true  Transportation Needs: No Transportation Needs (07/20/2023)   Received from [facility] - Transportation    In the past 12 months, has lack of transportation kept you from medical appointments or from getting medications?: No    Lack of Transportation (Non-Medical): No  Physical Activity: Not on file  Stress: No Stress Concern Present (08/10/2022)   [employer] of Occupational Health - Occupational Stress Questionnaire    Feeling of Stress : Not at all  Social Connections: Unknown (08/10/2022)   Social Connection and Isolation Panel [NHANES]    Frequency of  Communication with Friends and Family: Three times a week    Frequency of Social Gatherings with Friends and Family: Twice a week    Attends Religious Services: Patient declined    Database administrator or Organizations: No    Attends Banker Meetings: Never    Marital Status: Married  Catering manager Violence: Not At Risk (01/20/2023)   Humiliation, Afraid, Rape, and Kick questionnaire    Fear of Current or Ex-Partner: No    Emotionally Abused: No    Physically Abused: No    Sexually Abused: No    FAMILY HISTORY: Family History  Problem Relation Age of Onset   Diabetes Mother    Heart disease Mother    Heart attack Father    Prostate cancer Father     ALLERGIES:  is allergic to paclitaxel, ambien [zolpidem], nsaids, and aleve [naproxen sodium].  MEDICATIONS:  Current Outpatient Medications  Medication Sig Dispense Refill   Accu-Chek Softclix Lancets lancets USE AS DIRECTED FOUR TIMES DAILY 100 each 12   acetaminophen (TYLENOL) 500 MG tablet Take 2 tablets (1,000 mg total) by mouth daily as needed. Home med. 30 tablet 0   albuterol (PROVENTIL) (2.5 MG/3ML) 0.083% nebulizer solution Take 2.5 mg by nebulization every 4 (four) hours as needed for wheezing or shortness of breath.     albuterol (VENTOLIN HFA) 108 (90 Base) MCG/ACT inhaler SMARTSIG:2 inhalation Via Inhaler Every 6 Hours PRN 1 each 2   azithromycin (ZITHROMAX) 250 MG tablet Take by mouth.     blood glucose meter kit and supplies KIT Check your blood glucose levels twice a day; once in the morning before breakfast; and once in the evening after dinner. 1 each 0   BREZTRI AEROSPHERE 160-9-4.8 MCG/ACT AERO Inhale 2 puffs into the lungs 2 (two) times daily.     dextromethorphan-guaiFENesin (MUCINEX DM) 30-600 MG 12hr tablet Take 1 tablet by mouth 2 (two) times daily as needed for cough.     dorzolamide-timolol (COSOPT) 22.3-6.8 MG/ML ophthalmic solution Place 1 drop into both eyes 2 (two) times daily.      eszopiclone (LUNESTA) 1 MG TABS tablet TAKE 1 TABLET(1 MG) BY MOUTH AT BEDTIME AS NEEDED FOR SLEEP 30 tablet 3   fentaNYL (DURAGESIC) 50 MCG/HR Place 1 patch onto the skin every 3 (three) days. 10 patch 0   glucose blood test strip Check your blood glucose levels twice a day; once in the morning  before breakfast; and once in the evening after dinner. 100 each 12   hydrOXYzine (ATARAX) 10 MG tablet Take 1 tablet (10 mg total) by mouth at bedtime as needed. 30 tablet 3   ipratropium-albuterol (DUONEB) 0.5-2.5 (3) MG/3ML SOLN Take 3 mLs by nebulization every 6 (six) hours as needed. 360 mL 1   latanoprost (XALATAN) 0.005 % ophthalmic solution Place 1 drop into both eyes at bedtime.     lidocaine-prilocaine (EMLA) cream Apply 1 Application topically as needed. 30 g 3   montelukast (SINGULAIR) 10 MG tablet TAKE 1 TABLET(10 MG) BY MOUTH AT BEDTIME 90 tablet 0   Multiple Vitamin (MULTIVITAMIN WITH MINERALS) TABS tablet Take 1 tablet by mouth daily.     oxyCODONE (OXY IR/ROXICODONE) 5 MG immediate release tablet Take 1 tablet (5 mg total) by mouth every 4 (four) hours as needed for severe pain (pain score 7-10). Take 1-2 pills every 4- 6 hours as needed for moderate to severe pain 90 tablet 0   potassium chloride SA (KLOR-CON M) 20 MEQ tablet TAKE 1 TABLET(20 MEQ) BY MOUTH DAILY 30 tablet 1   potassium chloride SA (KLOR-CON M) 20 MEQ tablet TAKE 1 TABLET(20 MEQ) BY MOUTH DAILY 30 tablet 1   potassium chloride SA (KLOR-CON M) 20 MEQ tablet TAKE 1 TABLET(20 MEQ) BY MOUTH DAILY 30 tablet 1   pregabalin (LYRICA) 50 MG capsule Take 1 capsule (50 mg total) by mouth 2 (two) times daily. 60 capsule 2   prochlorperazine (COMPAZINE) 10 MG tablet TAKE 1 TABLET(10 MG) BY MOUTH EVERY 6 HOURS AS NEEDED FOR NAUSEA OR VOMITING 30 tablet 1   propranolol ER (INDERAL LA) 60 MG 24 hr capsule TAKE 1 CAPSULE(60 MG) BY MOUTH DAILY 30 capsule 3   Respiratory Therapy Supplies (NEBULIZER) DEVI Use as directed 1 each 0   tamsulosin  (FLOMAX) 0.4 MG CAPS capsule Take 0.4 mg by mouth daily after supper.     triamcinolone ointment (KENALOG) 0.5 % Apply 1 Application topically 2 (two) times daily. 30 g 1   venlafaxine XR (EFFEXOR-XR) 75 MG 24 hr capsule Take 225 mg by mouth daily with breakfast.     vitamin B-12 (CYANOCOBALAMIN) 500 MCG tablet Take 1 tablet by mouth daily.     glipiZIDE (GLUCOTROL) 5 MG tablet TAKE 1 TABLET(5 MG) BY MOUTH DAILY BEFORE BREAKFAST (Patient not taking: Reported on 05/17/2023) 90 tablet 2   metFORMIN (GLUCOPHAGE) 500 MG tablet Take 1 tablet (500 mg total) by mouth 2 (two) times daily with a meal. (Patient not taking: Reported on 05/17/2023) 60 tablet 2   naloxone (NARCAN) nasal spray 4 mg/0.1 mL Place 1 spray into the nose once. (Patient not taking: Reported on 05/17/2023)     olmesartan (BENICAR) 40 MG tablet Take 1 tablet (40 mg total) by mouth every morning. (Patient not taking: Reported on 02/22/2023) 30 tablet 3   predniSONE (DELTASONE) 20 MG tablet Take 1 tablet (20 mg total) by mouth daily with breakfast. 30 tablet 0   No current facility-administered medications for this visit.   Facility-Administered Medications Ordered in Other Visits  Medication Dose Route Frequency Provider Last Rate Last Admin   heparin lock flush 100 UNIT/ML injection            heparin lock flush 100 UNIT/ML injection            heparin lock flush 100 UNIT/ML injection             PHYSICAL EXAMINATION: ECOG PERFORMANCE STATUS: 1 - Symptomatic  but completely ambulatory  Vitals:   09/13/23 0942  BP: 95/70  Pulse: (!) 59  Temp: 98.2 F (36.8 C)     Filed Weights   09/13/23 0942  Weight: 157 lb (71.2 kg)      Physical Exam Vitals and nursing note reviewed.  HENT:     Head: Normocephalic and atraumatic.     Mouth/Throat:     Pharynx: Oropharynx is clear.  Eyes:     Extraocular Movements: Extraocular movements intact.     Pupils: Pupils are equal, round, and reactive to light.  Cardiovascular:      Rate and Rhythm: Normal rate and regular rhythm.  Pulmonary:     Comments: Decreased breath sounds bilaterally.  Abdominal:     Palpations: Abdomen is soft.  Musculoskeletal:        General: Normal range of motion.     Cervical back: Normal range of motion.  Skin:    General: Skin is warm.  Neurological:     General: No focal deficit present.     Mental Status: He is alert and oriented to person, place, and time.  Psychiatric:        Behavior: Behavior normal.        Judgment: Judgment normal.    LABORATORY DATA:  I have reviewed the data as listed Lab Results  Component Value Date   WBC 13.4 (H) 09/13/2023   HGB 12.6 (L) 09/13/2023   HCT 38.8 (L) 09/13/2023   MCV 88.6 09/13/2023   PLT 198 09/13/2023   Recent Labs    07/26/23 0903 08/19/23 0937 09/13/23 0929  NA 137 136 140  K 3.4* 3.3* 3.5  CL 101 99 108  CO2 26 25 26   GLUCOSE 227* 236* 180*  BUN 9 14 10   CREATININE 1.20 1.16 0.95  CALCIUM 8.9 8.8* 7.9*  GFRNONAA >60 >60 >60  PROT 7.2 6.6 5.9*  ALBUMIN 3.5 3.4* 2.9*  AST 18 17 17   ALT 7 15 12   ALKPHOS 107 104 84  BILITOT 0.4 0.3 0.4    RADIOGRAPHIC STUDIES: I have personally reviewed the radiological images as listed and agreed with the findings in the report. NM PET Image Restag (PS) Skull Base To Thigh  Result Date: 08/16/2023 CLINICAL DATA:  Subsequent treatment strategy for RIGHT lung carcinoma. Unresectable lung carcinoma chest wall invasion. EXAM: NUCLEAR MEDICINE PET SKULL BASE TO THIGH TECHNIQUE: 8.6 mCi F-18 FDG was injected intravenously. Full-ring PET imaging was performed from the skull base to thigh after the radiotracer. CT data was obtained and used for attenuation correction and anatomic localization. Fasting blood glucose: 112 mg/dl COMPARISON:  PET-CT scan 09/22/2022, CT scan 06/20/2023 FINDINGS: NECK: No hypermetabolic lymph nodes in the neck. Incidental CT findings: None. CHEST: Rim of hypermetabolic activity associated with the RIGHT upper  lobe cavitary process is increased now with circumferential hypermetabolic activity compared to intermittent hypermetabolic activity on comparison PET-CT scan. Activity is intense along the RIGHT lateral chest wall with SUV max equal 11.3. There is evidence of chest wall invasion associated hypermetabolic activity with erosion multiple RIGHT lateral ribs including the third through sixth ribs. The rib invasion is new from comparison PET-CT scan. Hypermetabolic LEFT supra hilar mass along the mediastinal pleural surface with SUV max equal 9.8 compared SUV max equal 8.2. Mass appears slightly thickened compared to prior measuring 30 mm x 23 mm compared to 33 mm x 20 mm. Several new hypermetabolic prevascular lymph nodes. For example cluster of small nodes along the aortic arch  with SUV max equal 4.0 on image 52. New hypermetabolic lymph node adjacent to the descending thoracic aorta measuring 9 mm image 51/6 with SUV max equal 6.1. Incidental CT findings: New focus of metabolic activity within the sternum with SUV max equal 6.0 on image 181 a subtle sclerotic lesion on CT portion exam. ABDOMEN/PELVIS: No hepatic metastasis. Adrenal glands normal. RIGHT adrenal gland adenoma again noted. No hypermetabolic lymph nodes in the abdomen pelvis. Again demonstrated hypermetabolic mass at the cecum measuring 4.2 cm (image 133) with SUV max equal 9.7. Lesion measured 3.6 cm on comparison PET-CT scan similar hypermetabolic activity. Incidental CT findings: None. SKELETON: Single hypermetabolic sternal metastasis described in chest section. Direct chest wall invasion described in chest section. No additional skeletal metastasis Incidental CT findings: None. IMPRESSION: 1. Increased hypermetabolic activity associated with the RIGHT upper lobe cavitary process. Intense hypermetabolic activity along the RIGHT lateral chest wall with evidence of chest wall invasion and erosion of multiple RIGHT lateral ribs. Findings consistent with  local progression of lung carcinoma. 2. New hypermetabolic prevascular mediastinal lymph nodes consistent with nodal metastasis. 3. New hypermetabolic sternal metastasis. 4. Similar hypermetabolic LEFT suprahilar mass consistent metastatic disease. 5. Slight increased size of hypermetabolic mass at the cecum. Electronically Signed   By: Genevive Bi M.D.   On: 08/16/2023 12:08    ASSESSMENT & PLAN:   Primary cancer of right upper lobe of lung (HCC) # UNRESECTABLE/LOCALLY ADVANCED- Right upper lobe lung mass -concerning for malignancy [biopsy inconclusive/atypical cells]-  Currently OFF therapy/on surveillance [given poor tolerance to Keytruda].  Unfortunately NOV 12th, PET scan:  Increased hypermetabolic activity associated with the RIGHT upper lobe cavitary process. Intense hypermetabolic activity along the RIGHT lateral chest wall with evidence of chest wall invasion and erosion of multiple RIGHT lateral ribs. Findings consistent with local progression of lung carcinoma. New hypermetabolic prevascular mediastinal lymph nodes consistent with nodal metastasis. New hypermetabolic sternal metastasis. Similar hypermetabolic LEFT suprahilar mass consistent metastatic disease; Slight increased size of hypermetabolic mass at the cecum.  # patient currently on hospice- tolerating pain better controlled.  However for now patient wants to continue follow-up in the clinic.  Continue surveillance of chemotherapy.   #Right chest wall pain-/pleuritic-secondary to progressive malignancy.  Increase fentanyl to 50 mcg and also increase oxycodone 5 mg 1 to 2 pills every 4-6 hours.    # COPD/fatigue- [coughing/phlegm/ no fevers]- on albuterol/advair [smoking]- TSH- WNL;Continue  Xopenex/ipratropium nebs-; Mucinex- DM BID-continue prednisone - stable.   # Hypokalemia-3.4 - continue potassium 20 mEq liquid.-Hypocalemia: 8.4/albumin- 2.10 December 2022-vit D 105- HOLD off -Vit D 50,K. Stable.   # OCT 2023- Hx of  colostomy [prior]-  Cecal mass ~ 3 cm s/p colonoscopy-cecal mass clinically suggestive of malignancy-biopsy high-grade dysplasia; s/p evaluation Dr. Lemar Livings [Dr.Russow[facility]] PET scan slight progression asymptomatic at this time continue monitoring on hospice.   # Labile Blood glucose- BG as low as 25; taken off glipizide-  Metformin [PCP]- 228-stable.  Monitor closely on steroids prednisone 20 mg a day.  # Ongoing fatigue: Poor appetite continue prednisone 20 mg a day.   # IV mediport: functioning/stable.   # Insomnia-continue Lunesta refilled- stable.   *appt thru mychart   # DISPOSITION: # De-access-NO IVFs or KCL # follow up in 5 weeks-Tuesday- MD; labs- cbc/cmp; possible  IVFs-1 lit /1 hour;KCL 20 IV-  Dr.B    All questions were answered. The patient knows to call the clinic with any problems, questions or concerns.    [name],  MD 09/13/2023 11:00 AM

## 2023-09-13 NOTE — Assessment & Plan Note (Addendum)
#   UNRESECTABLE/LOCALLY ADVANCED- Right upper lobe lung mass -concerning for malignancy [biopsy inconclusive/atypical cells]-  Currently OFF therapy/on surveillance [given poor tolerance to Keytruda].  Unfortunately NOV 12th, PET scan:  Increased hypermetabolic activity associated with the RIGHT upper lobe cavitary process. Intense hypermetabolic activity along the RIGHT lateral chest wall with evidence of chest wall invasion and erosion of multiple RIGHT lateral ribs. Findings consistent with local progression of lung carcinoma. New hypermetabolic prevascular mediastinal lymph nodes consistent with nodal metastasis. New hypermetabolic sternal metastasis. Similar hypermetabolic LEFT suprahilar mass consistent metastatic disease; Slight increased size of hypermetabolic mass at the cecum.  # patient currently on hospice- tolerating pain better controlled.  However for now patient wants to continue follow-up in the clinic.  Continue surveillance of chemotherapy.   #Right chest wall pain-/pleuritic-secondary to progressive malignancy.  Increase fentanyl to 50 mcg and also increase oxycodone 5 mg 1 to 2 pills every 4-6 hours.    # COPD/fatigue- [coughing/phlegm/ no fevers]- on albuterol/advair [smoking]- TSH- WNL;Continue  Xopenex/ipratropium nebs-; Mucinex- DM BID-continue prednisone - stable.   # Hypokalemia-3.4 - continue potassium 20 mEq liquid.-Hypocalemia: 8.4/albumin- 2.10 December 2022-vit D 105- HOLD off -Vit D 50,K. Stable.   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 Lemar Livings [Dr.Russow[facility]] PET scan slight progression asymptomatic at this time continue monitoring on hospice.   # Labile Blood glucose- BG as low as 25; taken off glipizide-  Metformin [PCP]- 228-stable.  Monitor closely on steroids prednisone 20 mg a day.  # Ongoing fatigue: Poor appetite continue prednisone 20  mg a day.   # IV mediport: functioning/stable.   # Insomnia-continue Lunesta refilled- stable.   *appt thru mychart   # DISPOSITION: # De-access-NO IVFs or KCL # follow up in 5 weeks-Tuesday- MD; labs- cbc/cmp; possible  IVFs-1 lit /1 hour;KCL 20 IV-  Dr.B

## 2023-09-13 NOTE — Progress Notes (Signed)
Appetite 50% normal, comes and goes, no supplement drinks.  Constipation, uses apple juice.  C/o heartburn, tums.  Sleeps about 18-20 hours per day.  No pain right now, wife states he took 6-7 oxycodone's yesterday, having more pain than usual.  Couldn't get O2 read, fingers white.

## 2023-09-13 NOTE — Progress Notes (Signed)
Palliative Medicine [facility]  [phone] [phone]  Patient Care Team: [name], MD as PCP - General (Family Medicine) [name], RN as Oncology Nurse Navigator Orson Ape, MD as Consulting Physician (Urology) [name], MD as Consulting Physician (Oncology) [name], MD as Consulting Physician (Pulmonary Disease)   Name of the patient: Rodney Vega  [id number]  1955-10-09   Date of visit: 09/13/23  Reason for Consult: Rodney Vega is a 67 year old man with multiple medical problems including recurrent unresectable locally advanced non-small cell lung cancer status post chemotherapy and radiation.  Most recently on surveillance given poor tolerance for immunotherapy.  PET scan on 08/16/2023 unfortunately showed progression of right upper lobe mass with invasion and lateral chest wall and ribs in addition to new hypermetabolic mediastinal lymph nodes and sternal metastasis. Then transitioned to hospice.    PAST MEDICAL HISTORY: Past Medical History:  Diagnosis Date   Acute on chronic respiratory failure (HCC)    Anemia    Cancer (HCC)    Colostomy present (HCC)    COPD (chronic obstructive pulmonary disease) (HCC)    Degenerative arthritis of knee    Depression    Diabetes mellitus without complication (HCC)    Diverticulitis    Dyspnea    Glaucoma    since 2004   Hernia 1979   History of kidney stones    History of methicillin resistant staphylococcus aureus (MRSA)    Hypertension    Hypokalemia    Marijuana use    Mass of cecum    Nephrolithiasis    Neuromuscular disorder (HCC)    Personal history of tobacco use, presenting hazards to health    Pneumonia    Port-A-Cath in place    Pre-diabetes    Primary cancer of right upper lobe of lung (HCC)    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)    Screening for obesity    Severe sepsis (HCC)    Special screening for malignant neoplasms,  colon    Tobacco use    Wears dentures    full upper and lower    PAST SURGICAL HISTORY:  Past Surgical History:  Procedure Laterality Date   BACK SURGERY  1994, 2012   lumbar bulging disc   BRONCHOSCOPY  05/05/2022   CATARACT EXTRACTION W/PHACO Left 01/20/2021   Procedure: CATARACT EXTRACTION PHACO AND INTRAOCULAR LENS PLACEMENT (IOC) LEFT;  Surgeon: Galen Manila, MD;  Location: [facility];  Service: Ophthalmology;  Laterality: Left;  10.13 0:52.1   COLONOSCOPY  5284,[id number]   [facility]/Dr. Carmell Austria sessile polyp,110mm, found in rectum,multiple diverticula were found in the sigmoid, in descending and in transverse colon   COLONOSCOPY WITH PROPOFOL N/A 10/22/2021   Procedure: COLONOSCOPY WITH PROPOFOL;  Surgeon: Jaynie Collins, DO;  Location: [facility];  Service: Gastroenterology;  Laterality: N/A;  DM   COLOSTOMY  04/20/2013   EYE SURGERY     HERNIA REPAIR  1979   inguinal   IR IMAGING GUIDED PORT INSERTION  06/10/2021   JOINT REPLACEMENT Right    knee   KNEE ARTHROPLASTY Right 05/08/2018   Procedure: COMPUTER ASSISTED TOTAL KNEE ARTHROPLASTY;  Surgeon: Donato Heinz, MD;  Location: [facility];  Service: Orthopedics;  Laterality: Right;   KNEE ARTHROPLASTY Left 11/16/2019   Procedure: COMPUTER ASSISTED TOTAL KNEE ARTHROPLASTY;  Surgeon: Donato Heinz, MD;  Location: [facility];  Service: Orthopedics;  Laterality: Left;   LAPAROTOMY  04/20/2013   colon resection with colosotmy  LITHOTRIPSY  2013   LUNG BIOPSY  2023   RIGID BRONCHOSCOPY N/A 10/29/2022   Procedure: RIGID BRONCHOSCOPY;  Surgeon: [name], MD;  Location: [facility];  Service: Thoracic;  Laterality: N/A;   TONSILLECTOMY AND ADENOIDECTOMY  1962   VIDEO BRONCHOSCOPY WITH ENDOBRONCHIAL NAVIGATION N/A 05/05/2022   Procedure: VIDEO BRONCHOSCOPY WITH ENDOBRONCHIAL NAVIGATION;  Surgeon: [name], MD;  Location: [facility];  Service: Thoracic;  Laterality: N/A;   VIDEO BRONCHOSCOPY WITH ENDOBRONCHIAL  ULTRASOUND N/A 05/05/2022   Procedure: VIDEO BRONCHOSCOPY WITH ENDOBRONCHIAL ULTRASOUND;  Surgeon: [name], MD;  Location: [facility];  Service: Thoracic;  Laterality: N/A;   VIDEO BRONCHOSCOPY WITH ENDOBRONCHIAL ULTRASOUND N/A 10/29/2022   Procedure: VIDEO BRONCHOSCOPY WITH ENDOBRONCHIAL ULTRASOUND;  Surgeon: [name], MD;  Location: [facility];  Service: Thoracic;  Laterality: N/A;    HEMATOLOGY/ONCOLOGY HISTORY:  Oncology History Overview Note  AUG 2022- 8.2 x 5.9 cm spiculated mass noted in right upper lobe consistent with malignancy. It extends from the right hilum to the lateral chest wall and results in lytic destruction of the lateral portion of the right fourth rib.  Mediastinal lymph nodes are noted, including 12 mm right hilar lymph node, consistent with metastatic disease. 3 cm right adrenal mass is noted concerning for metastatic disease.  # AUG 2022- PET IMPRESSION: Peripherally hypermetabolic right upper lobe mass, likely due to central necrosis. Mass extends into the right hilum and right lateral chest wall involving the second through fourth right ribs.   Mildly enlarged and hypermetabolic right hilar lymph node.   No evidence of metastatic disease in the abdomen or pelvis.  # AUG, 2022-RIGHT CHEST WALL Bx-necrosis; suspicious for malignancy not definitive [discussed with Dr.Kraynie;MDT] LUNG MASS, RIGHT; BIOPSY:  - SUSPICIOUS FOR MALIGNANCY.  - SEE COMMENT.   Comment:  Approximately six tissue cores demonstrate inflamed fibrous capsule with  hemosiderin-laden macrophages, in a background of abundant necrosis.  One of the cores demonstrates a minute fragment of viable epithelial  cells.  These cells are positive for p40.  They are negative for TTF1  and CK7. The cells of interest disappear on deeper cut levels.  While  the findings are suspicious for squamous cell carcinoma, there is not  enough viable tumor for a definitive diagnosis.  Additional tissue   sampling may be helpful.   # SEP 2022-cycle #2 Taxol hypersensitive reaction.  Switched over to #3 cycle- carbo-Abraxane starting 06/29/2021.  Discontinued IMFINZI therapy-December 2022 given poor tolerance/pneumonia admission to hospital.  #  [name] was in May-June 2023 x3 doses; stopped because of intolerance-fatigue patient preference/lack of progression.  # JAN 2024-bronchoscopy biopsy negative for malignancy; however PET scan concerning for recurrence/progression.    May 05, 2022-s/p bronchoscopy- with Dr.Aleskerov-significant purulent/necrotic debris noted. Dec 23rd, 2023- PET scan: The cavitary process in the right upper lobe is similar in size to previous CTS, although demonstrates prominent peripheral hypermetabolic activity. 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 Hypermetabolic activity within the adjacent upper right lateral chest wall with right 3rd through 5th rib deformities, suspicious for direct tumor spread. No evidence of distant osseous metastatic disease; Hypermetabolic left hilar lymph node worrisome for metastatic disease. No other hypermetabolic lymph nodes or distant metastases identified.  Bronchoscopy/EBUS [JAN 26th, 2024- Dr.A]- BAL/ 4R-lymph node FNA negative for malignancy.  Patient declined further invasive procedures.  # FEB 15t, 2024- RE-Start Keytruda.   # DEC 2022- s/p ID- Dr.ravishankar- ? Lung  abscess-no antibiotics-  JAN 2023- CT scan incidental- cecal mass- colonoscopy[Dr.Russo; [facility]- high grade dysplasia]  S/p evaluation with Dr. Sherryle Lis surgery for now]   # S/p evaluation with Dr. Sheppard Penton.   Primary cancer of right upper lobe of lung (HCC)  06/03/2021 Initial Diagnosis   Primary cancer of right upper lobe of lung (HCC)   06/03/2021 Cancer Staging   Staging form: Lung, AJCC 8th Edition - Clinical: Stage IIIB (cT3,  cN2, cM0) - Signed by [name], MD on 06/03/2021   06/15/2021 - 08/03/2021 Chemotherapy   Patient is on Treatment Plan : LUNG Carboplatin / Paclitaxel + XRT q7d     10/02/2021 - 10/19/2021 Chemotherapy   Patient is on Treatment Plan : LUNG Durvalumab q14d     02/03/2022 -  Chemotherapy   Patient is on Treatment Plan : LUNG NSCLC Pembrolizumab (200) q21d     02/04/2022 - 03/18/2022 Chemotherapy   Patient is on Treatment Plan : LUNG NSCLC Pembrolizumab (200) q21d       ALLERGIES:  is allergic to paclitaxel, ambien [zolpidem], nsaids, and aleve [naproxen sodium].  MEDICATIONS:  Current Outpatient Medications  Medication Sig Dispense Refill   Accu-Chek Softclix Lancets lancets USE AS DIRECTED FOUR TIMES DAILY 100 each 12   acetaminophen (TYLENOL) 500 MG tablet Take 2 tablets (1,000 mg total) by mouth daily as needed. Home med. 30 tablet 0   albuterol (PROVENTIL) (2.5 MG/3ML) 0.083% nebulizer solution Take 2.5 mg by nebulization every 4 (four) hours as needed for wheezing or shortness of breath.     albuterol (VENTOLIN HFA) 108 (90 Base) MCG/ACT inhaler SMARTSIG:2 inhalation Via Inhaler Every 6 Hours PRN 1 each 2   azithromycin (ZITHROMAX) 250 MG tablet Take by mouth.     blood glucose meter kit and supplies KIT Check your blood glucose levels twice a day; once in the morning before breakfast; and once in the evening after dinner. 1 each 0   BREZTRI AEROSPHERE 160-9-4.8 MCG/ACT AERO Inhale 2 puffs into the lungs 2 (two) times daily.     dextromethorphan-guaiFENesin (MUCINEX DM) 30-600 MG 12hr tablet Take 1 tablet by mouth 2 (two) times daily as needed for cough.     dorzolamide-timolol (COSOPT) 22.3-6.8 MG/ML ophthalmic solution Place 1 drop into both eyes 2 (two) times daily.     eszopiclone (LUNESTA) 1 MG TABS tablet TAKE 1 TABLET(1 MG) BY MOUTH AT BEDTIME AS NEEDED FOR SLEEP 30 tablet 3   fentaNYL (DURAGESIC) 50 MCG/HR Place 1 patch onto the skin every 3 (three) days. 10 patch 0    glipiZIDE (GLUCOTROL) 5 MG tablet TAKE 1 TABLET(5 MG) BY MOUTH DAILY BEFORE BREAKFAST (Patient not taking: Reported on 05/17/2023) 90 tablet 2   glucose blood test strip Check your blood glucose levels twice a day; once in the morning before breakfast; and once in the evening after dinner. 100 each 12   hydrOXYzine (ATARAX) 10 MG tablet Take 1 tablet (10 mg total) by mouth at bedtime as needed. 30 tablet 3   ipratropium-albuterol (DUONEB) 0.5-2.5 (3) MG/3ML SOLN Take 3 mLs by nebulization every 6 (six) hours as needed. 360 mL 1   latanoprost (XALATAN) 0.005 % ophthalmic solution Place 1 drop into both eyes at bedtime.     lidocaine-prilocaine (EMLA) cream Apply 1 Application topically as needed. 30 g 3   metFORMIN (GLUCOPHAGE) 500 MG tablet Take 1 tablet (500 mg total) by mouth 2 (two) times daily with a meal. (Patient not taking: Reported on 05/17/2023) 60 tablet  2   montelukast (SINGULAIR) 10 MG tablet TAKE 1 TABLET(10 MG) BY MOUTH AT BEDTIME 90 tablet 0   Multiple Vitamin (MULTIVITAMIN WITH MINERALS) TABS tablet Take 1 tablet by mouth daily.     naloxone (NARCAN) nasal spray 4 mg/0.1 mL Place 1 spray into the nose once. (Patient not taking: Reported on 05/17/2023)     olmesartan (BENICAR) 40 MG tablet Take 1 tablet (40 mg total) by mouth every morning. (Patient not taking: Reported on 02/22/2023) 30 tablet 3   oxyCODONE (OXY IR/ROXICODONE) 5 MG immediate release tablet Take 1 tablet (5 mg total) by mouth every 4 (four) hours as needed for severe pain (pain score 7-10). Take 1-2 pills every 4- 6 hours as needed for moderate to severe pain 90 tablet 0   potassium chloride SA (KLOR-CON M) 20 MEQ tablet TAKE 1 TABLET(20 MEQ) BY MOUTH DAILY 30 tablet 1   potassium chloride SA (KLOR-CON M) 20 MEQ tablet TAKE 1 TABLET(20 MEQ) BY MOUTH DAILY 30 tablet 1   potassium chloride SA (KLOR-CON M) 20 MEQ tablet TAKE 1 TABLET(20 MEQ) BY MOUTH DAILY 30 tablet 1   predniSONE (DELTASONE) 20 MG tablet Take 1 tablet (20 mg  total) by mouth daily with breakfast. 30 tablet 0   pregabalin (LYRICA) 50 MG capsule Take 1 capsule (50 mg total) by mouth 2 (two) times daily. 60 capsule 2   prochlorperazine (COMPAZINE) 10 MG tablet TAKE 1 TABLET(10 MG) BY MOUTH EVERY 6 HOURS AS NEEDED FOR NAUSEA OR VOMITING 30 tablet 1   propranolol ER (INDERAL LA) 60 MG 24 hr capsule TAKE 1 CAPSULE(60 MG) BY MOUTH DAILY 30 capsule 3   Respiratory Therapy Supplies (NEBULIZER) DEVI Use as directed 1 each 0   tamsulosin (FLOMAX) 0.4 MG CAPS capsule Take 0.4 mg by mouth daily after supper.     triamcinolone ointment (KENALOG) 0.5 % Apply 1 Application topically 2 (two) times daily. 30 g 1   venlafaxine XR (EFFEXOR-XR) 75 MG 24 hr capsule Take 225 mg by mouth daily with breakfast.     vitamin B-12 (CYANOCOBALAMIN) 500 MCG tablet Take 1 tablet by mouth daily.     No current facility-administered medications for this visit.   Facility-Administered Medications Ordered in Other Visits  Medication Dose Route Frequency Provider Last Rate Last Admin   heparin lock flush 100 UNIT/ML injection            heparin lock flush 100 UNIT/ML injection            heparin lock flush 100 UNIT/ML injection             VITAL SIGNS: There were no vitals taken for this visit. There were no vitals filed for this visit.    Estimated body mass index is 21.9 kg/m as calculated from the following:   Height as of an earlier encounter on 09/13/23: 5\' 11"  (1.803 m).   Weight as of an earlier encounter on 09/13/23: 157 lb (71.2 kg).  LABS: CBC:    Component Value Date/Time   WBC 13.4 (H) 09/13/2023 0929   WBC 7.1 01/25/2023 0939   HGB 12.6 (L) 09/13/2023 0929   HGB 7.5 (L) 12/01/2021 0504   HCT 38.8 (L) 09/13/2023 0929   HCT 23.7 (L) 12/01/2021 0504   PLT 198 09/13/2023 0929   PLT 243 12/01/2021 0504   MCV 88.6 09/13/2023 0929   MCV 89 12/01/2021 0504   MCV 91 09/21/2013 0522   NEUTROABS 11.5 (H) 09/13/2023 0929   NEUTROABS  9.6 (H) 12/01/2021 0504    NEUTROABS 11.3 (H) 09/21/2013 0522   LYMPHSABS 0.8 09/13/2023 0929   LYMPHSABS 0.8 12/01/2021 0504   LYMPHSABS 1.4 09/21/2013 0522   MONOABS 0.7 09/13/2023 0929   MONOABS 0.9 09/21/2013 0522   EOSABS 0.2 09/13/2023 0929   EOSABS 0.3 12/01/2021 0504   EOSABS 0.0 09/21/2013 0522   BASOSABS 0.1 09/13/2023 0929   BASOSABS 0.0 12/01/2021 0504   BASOSABS 0.1 09/21/2013 0522   Comprehensive Metabolic Panel:    Component Value Date/Time   NA 140 09/13/2023 0929   NA 138 09/20/2013 0639   K 3.5 09/13/2023 0929   K 4.0 09/20/2013 0639   CL 108 09/13/2023 0929   CL 107 09/20/2013 0639   CO2 26 09/13/2023 0929   CO2 26 09/20/2013 0639   BUN 10 09/13/2023 0929   BUN 12 09/20/2013 0639   CREATININE 0.95 09/13/2023 0929   CREATININE 0.84 09/24/2013 0515   GLUCOSE 180 (H) 09/13/2023 0929   GLUCOSE 160 (H) 09/20/2013 0639   CALCIUM 7.9 (L) 09/13/2023 0929   CALCIUM 8.2 (L) 09/20/2013 0639   AST 17 09/13/2023 0929   ALT 12 09/13/2023 0929   ALT 20 01/08/2012 0939   ALKPHOS 84 09/13/2023 0929   ALKPHOS 69 01/08/2012 0939   BILITOT 0.4 09/13/2023 0929   PROT 5.9 (L) 09/13/2023 0929   PROT 6.5 09/12/2013 1002   PROT 6.9 01/08/2012 0939   ALBUMIN 2.9 (L) 09/13/2023 0929   ALBUMIN 4.4 09/12/2013 1002   ALBUMIN 3.5 01/08/2012 0939    RADIOGRAPHIC STUDIES: NM PET Image Restag (PS) Skull Base To Thigh  Result Date: 08/16/2023 CLINICAL DATA:  Subsequent treatment strategy for RIGHT lung carcinoma. Unresectable lung carcinoma chest wall invasion. EXAM: NUCLEAR MEDICINE PET SKULL BASE TO THIGH TECHNIQUE: 8.6 mCi F-18 FDG was injected intravenously. Full-ring PET imaging was performed from the skull base to thigh after the radiotracer. CT data was obtained and used for attenuation correction and anatomic localization. Fasting blood glucose: 112 mg/dl COMPARISON:  PET-CT scan 09/22/2022, CT scan 06/20/2023 FINDINGS: NECK: No hypermetabolic lymph nodes in the neck. Incidental CT findings: None.  CHEST: Rim of hypermetabolic activity associated with the RIGHT upper lobe cavitary process is increased now with circumferential hypermetabolic activity compared to intermittent hypermetabolic activity on comparison PET-CT scan. Activity is intense along the RIGHT lateral chest wall with SUV max equal 11.3. There is evidence of chest wall invasion associated hypermetabolic activity with erosion multiple RIGHT lateral ribs including the third through sixth ribs. The rib invasion is new from comparison PET-CT scan. Hypermetabolic LEFT supra hilar mass along the mediastinal pleural surface with SUV max equal 9.8 compared SUV max equal 8.2. Mass appears slightly thickened compared to prior measuring 30 mm x 23 mm compared to 33 mm x 20 mm. Several new hypermetabolic prevascular lymph nodes. For example cluster of small nodes along the aortic arch with SUV max equal 4.0 on image 52. New hypermetabolic lymph node adjacent to the descending thoracic aorta measuring 9 mm image 51/6 with SUV max equal 6.1. Incidental CT findings: New focus of metabolic activity within the sternum with SUV max equal 6.0 on image 181 a subtle sclerotic lesion on CT portion exam. ABDOMEN/PELVIS: No hepatic metastasis. Adrenal glands normal. RIGHT adrenal gland adenoma again noted. No hypermetabolic lymph nodes in the abdomen pelvis. Again demonstrated hypermetabolic mass at the cecum measuring 4.2 cm (image 133) with SUV max equal 9.7. Lesion measured 3.6 cm on comparison PET-CT scan similar hypermetabolic activity.  Incidental CT findings: None. SKELETON: Single hypermetabolic sternal metastasis described in chest section. Direct chest wall invasion described in chest section. No additional skeletal metastasis Incidental CT findings: None. IMPRESSION: 1. Increased hypermetabolic activity associated with the RIGHT upper lobe cavitary process. Intense hypermetabolic activity along the RIGHT lateral chest wall with evidence of chest wall invasion  and erosion of multiple RIGHT lateral ribs. Findings consistent with local progression of lung carcinoma. 2. New hypermetabolic prevascular mediastinal lymph nodes consistent with nodal metastasis. 3. New hypermetabolic sternal metastasis. 4. Similar hypermetabolic LEFT suprahilar mass consistent metastatic disease. 5. Slight increased size of hypermetabolic mass at the cecum. Electronically Signed   By: Genevive Bi M.D.   On: 08/16/2023 12:08    PERFORMANCE STATUS (ECOG) : 1 - Symptomatic but completely ambulatory  Review of Systems Unless otherwise noted, a complete review of systems is negative.  Physical Exam General: NAD Extremities: no edema, no joint deformities Skin: no rashes Neurological: Grossly nonfocal  Assessment:   Follow-up visit.  Patient has been on hospice care at home.  With patient and wife state that hospice is going well.  Patient denies any acute changes or concerns today.  Symptomatically, pain is reasonably well-controlled on fentanyl and oxycodone.  Patient says that some days are better than others but overall pain is improved after dose increase of fentanyl.  Denies any adverse effects from pain medications.  Denies other symptomatic complaints or concerns.  Both patient and wife would like to continue having interval follow-up in the cancer center even if there is no expectation of further cancer treatment.  Plan: -Best supportive care -Followed by hospice at home -Continue fentanyl/oxycodone -Daily bowel regimen -Follow-up visit 1 month  Case and plan discussed with Dr. Donneta Romberg  Patient expressed understanding and was in agreement with this plan. He also understands that He can call clinic at any time with any questions, concerns, or complaints.   Thank you for allowing me to participate in the care of this very pleasant patient.   Time Total: 15 minutes  Visit consisted of counseling and education dealing with the complex and emotionally  intense issues of symptom management in the setting of serious illness.Greater than 50%  of this time was spent counseling and coordinating care related to the above assessment and plan.  Signed by: Laurette Schimke, PhD, NP-C

## 2023-09-16 ENCOUNTER — Telehealth: Payer: Medicare PPO | Admitting: Hospice and Palliative Medicine

## 2023-09-30 ENCOUNTER — Other Ambulatory Visit: Payer: Self-pay | Admitting: *Deleted

## 2023-09-30 MED ORDER — HYDROCOD POLI-CHLORPHE POLI ER 10-8 MG/5ML PO SUER: 5.0000 mL | Freq: Two times a day (BID) | ORAL | 0 refills | Status: DC | PRN

## 2023-09-30 NOTE — Telephone Encounter (Signed)
Called Brandi with [facility]. Left vm that tussinex script was sent to pt's pharmacy

## 2023-09-30 NOTE — Telephone Encounter (Signed)
Lauren script pended for tussinex. Please consider approval.

## 2023-09-30 NOTE — Telephone Encounter (Signed)
[facility] would like a prescription for Tussinex sent to patient's pharmacy. Tussinex 5 ml as needed dispense 140 mls. I did not see it in his med list to pend for you.

## 2023-10-03 ENCOUNTER — Encounter: Payer: Self-pay | Admitting: Internal Medicine

## 2023-10-18 ENCOUNTER — Inpatient Hospital Stay (HOSPITAL_BASED_OUTPATIENT_CLINIC_OR_DEPARTMENT_OTHER): Admitting: Hospice and Palliative Medicine

## 2023-10-18 ENCOUNTER — Inpatient Hospital Stay

## 2023-10-18 ENCOUNTER — Inpatient Hospital Stay: Attending: Internal Medicine

## 2023-10-18 ENCOUNTER — Inpatient Hospital Stay (HOSPITAL_BASED_OUTPATIENT_CLINIC_OR_DEPARTMENT_OTHER): Admitting: Internal Medicine

## 2023-10-18 ENCOUNTER — Encounter: Payer: Self-pay | Admitting: Internal Medicine

## 2023-10-18 VITALS — BP 113/75 | HR 66 | Temp 98.0°F | Resp 18 | Wt 143.7 lb

## 2023-10-18 DIAGNOSIS — Z7984 Long term (current) use of oral hypoglycemic drugs: Secondary | ICD-10-CM | POA: Diagnosis not present

## 2023-10-18 DIAGNOSIS — G893 Neoplasm related pain (acute) (chronic): Secondary | ICD-10-CM | POA: Diagnosis not present

## 2023-10-18 DIAGNOSIS — I10 Essential (primary) hypertension: Secondary | ICD-10-CM | POA: Insufficient documentation

## 2023-10-18 DIAGNOSIS — E876 Hypokalemia: Secondary | ICD-10-CM | POA: Diagnosis not present

## 2023-10-18 DIAGNOSIS — C3411 Malignant neoplasm of upper lobe, right bronchus or lung: Secondary | ICD-10-CM | POA: Insufficient documentation

## 2023-10-18 DIAGNOSIS — R6520 Severe sepsis without septic shock: Secondary | ICD-10-CM | POA: Insufficient documentation

## 2023-10-18 DIAGNOSIS — K449 Diaphragmatic hernia without obstruction or gangrene: Secondary | ICD-10-CM | POA: Diagnosis not present

## 2023-10-18 DIAGNOSIS — E119 Type 2 diabetes mellitus without complications: Secondary | ICD-10-CM | POA: Diagnosis not present

## 2023-10-18 DIAGNOSIS — F17210 Nicotine dependence, cigarettes, uncomplicated: Secondary | ICD-10-CM | POA: Diagnosis not present

## 2023-10-18 DIAGNOSIS — J449 Chronic obstructive pulmonary disease, unspecified: Secondary | ICD-10-CM | POA: Diagnosis not present

## 2023-10-18 DIAGNOSIS — Z79899 Other long term (current) drug therapy: Secondary | ICD-10-CM | POA: Insufficient documentation

## 2023-10-18 DIAGNOSIS — Z515 Encounter for palliative care: Secondary | ICD-10-CM

## 2023-10-18 DIAGNOSIS — C7951 Secondary malignant neoplasm of bone: Secondary | ICD-10-CM | POA: Diagnosis present

## 2023-10-18 DIAGNOSIS — Z87442 Personal history of urinary calculi: Secondary | ICD-10-CM | POA: Insufficient documentation

## 2023-10-18 DIAGNOSIS — J9620 Acute and chronic respiratory failure, unspecified whether with hypoxia or hypercapnia: Secondary | ICD-10-CM | POA: Insufficient documentation

## 2023-10-18 DIAGNOSIS — Z9221 Personal history of antineoplastic chemotherapy: Secondary | ICD-10-CM | POA: Diagnosis not present

## 2023-10-18 DIAGNOSIS — Z7952 Long term (current) use of systemic steroids: Secondary | ICD-10-CM | POA: Diagnosis not present

## 2023-10-18 LAB — CBC WITH DIFFERENTIAL (CANCER CENTER ONLY)
Abs Immature Granulocytes: 0.09 10*3/uL — ABNORMAL HIGH (ref 0.00–0.07)
Basophils Absolute: 0.1 10*3/uL (ref 0.0–0.1)
Basophils Relative: 1 %
Eosinophils Absolute: 0.2 10*3/uL (ref 0.0–0.5)
Eosinophils Relative: 1 %
HCT: 36.7 % — ABNORMAL LOW (ref 39.0–52.0)
Hemoglobin: 12 g/dL — ABNORMAL LOW (ref 13.0–17.0)
Immature Granulocytes: 1 %
Lymphocytes Relative: 8 %
Lymphs Abs: 1 10*3/uL (ref 0.7–4.0)
MCH: 29 pg (ref 26.0–34.0)
MCHC: 32.7 g/dL (ref 30.0–36.0)
MCV: 88.6 fL (ref 80.0–100.0)
Monocytes Absolute: 0.8 10*3/uL (ref 0.1–1.0)
Monocytes Relative: 6 %
Neutro Abs: 10.4 10*3/uL — ABNORMAL HIGH (ref 1.7–7.7)
Neutrophils Relative %: 83 %
Platelet Count: 200 10*3/uL (ref 150–400)
RBC: 4.14 MIL/uL — ABNORMAL LOW (ref 4.22–5.81)
RDW: 14.8 % (ref 11.5–15.5)
WBC Count: 12.5 10*3/uL — ABNORMAL HIGH (ref 4.0–10.5)
nRBC: 0 % (ref 0.0–0.2)

## 2023-10-18 LAB — CMP (CANCER CENTER ONLY)
ALT: 24 U/L (ref 0–44)
AST: 32 U/L (ref 15–41)
Albumin: 2.9 g/dL — ABNORMAL LOW (ref 3.5–5.0)
Alkaline Phosphatase: 110 U/L (ref 38–126)
Anion gap: 12 (ref 5–15)
BUN: 13 mg/dL (ref 8–23)
CO2: 27 mmol/L (ref 22–32)
Calcium: 8.9 mg/dL (ref 8.9–10.3)
Chloride: 98 mmol/L (ref 98–111)
Creatinine: 1.01 mg/dL (ref 0.61–1.24)
GFR, Estimated: >60 mL/min (ref >60)
Glucose, Bld: 226 mg/dL — ABNORMAL HIGH (ref 70–99)
Potassium: 3.4 mmol/L — ABNORMAL LOW (ref 3.5–5.1)
Sodium: 137 mmol/L (ref 135–145)
Total Bilirubin: 0.6 mg/dL (ref 0.0–1.2)
Total Protein: 6.8 g/dL (ref 6.5–8.1)

## 2023-10-18 MED ORDER — PREGABALIN 100 MG PO CAPS: 100.0000 mg | ORAL_CAPSULE | Freq: Two times a day (BID) | ORAL | 2 refills | Status: DC

## 2023-10-18 MED ORDER — NYSTATIN 100000 UNIT/ML MT SUSP: 5.0000 mL | Freq: Four times a day (QID) | OROMUCOSAL | 0 refills | Status: DC

## 2023-10-18 MED ORDER — FENTANYL 50 MCG/HR TD PT72: 1.0000 | MEDICATED_PATCH | TRANSDERMAL | 0 refills | Status: DC

## 2023-10-18 MED ORDER — PREDNISONE 20 MG PO TABS: 20.0000 mg | ORAL_TABLET | Freq: Every day | ORAL | 1 refills | Status: DC

## 2023-10-18 MED ORDER — OXYCODONE HCL 5 MG PO TABS
5.0000 mg | ORAL_TABLET | ORAL | 0 refills | Status: DC | PRN
Start: 2023-10-18 — End: 2023-11-14

## 2023-10-18 MED ORDER — HEPARIN SOD (PORK) LOCK FLUSH 100 UNIT/ML IV SOLN
500.0000 [IU] | Freq: Once | INTRAVENOUS | Status: AC
  Administered 2023-10-18: 500 [IU] via INTRAVENOUS
  Filled 2023-10-18: qty 5

## 2023-10-18 NOTE — Assessment & Plan Note (Addendum)
#   UNRESECTABLE/LOCALLY ADVANCED- Right upper lobe lung mass -concerning for malignancy [biopsy inconclusive/atypical cells]-  Currently OFF therapy/on surveillance [given poor tolerance to Keytruda ].  Unfortunately NOV 12th, PET scan:  Increased hypermetabolic activity associated with the RIGHT upper lobe cavitary process. Intense hypermetabolic activity along the RIGHT lateral chest wall with evidence of chest wall invasion and erosion of multiple RIGHT lateral ribs. Findings consistent with local progression of lung carcinoma. New hypermetabolic prevascular mediastinal lymph nodes consistent with nodal metastasis. New hypermetabolic sternal metastasis. Similar hypermetabolic LEFT suprahilar mass consistent metastatic disease; Slight increased size of hypermetabolic mass at the cecum.  # patient currently on hospice-pain poorly controlled.  However for now patient wants to continue follow-up in the clinic.  Continue surveillance OFF chemotherapy.   #Right chest wall pain-/pleuritic-secondary to progressive malignancy- worsening.  Increase fentanyl  to 50 mcg and also increase oxycodone  5 mg 1 to 2 pills every 4-6 hours.   Add lyrica  - discussed with [name] borders/hospice.   # COPD/fatigue- [coughing/phlegm/ no fevers]- on albuterol /advair [smoking]- TSH- WNL;Continue  Xopenex /ipratropium nebs-; Mucinex - DM BID-continue prednisone  - stable.   # Oral Thrush- start nysatin.   # Hypokalemia-3.4 - continue potassium 20 mEq liquid.-Hypocalemia: 8.4/albumin- 2.10 December 2022-vit D 105- HOLD off -Vit D 50,K. Stable.   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 Dessa [Dr.[facility]] PET scan slight progression asymptomatic at this time continue monitoring on hospice.   # Labile Blood glucose- BG as low as 25; taken off glipizide -  Metformin  [PCP]- 228-stable.  Monitor closely on steroids  prednisone  20 mg a day.  # Ongoing fatigue: Poor appetite continue prednisone  20 mg a day.   # IV mediport: functioning/stable.   # Prognosis:   *appt thru mychart   # DISPOSITION: # De-access-NO IVFs or KCL # follow up in 5  weeks-Tuesday- MD; labs- cbc/cmp; possible  IVFs-1 lit /1 hour;KCL 20 IV-  Dr.B

## 2023-10-18 NOTE — Progress Notes (Signed)
 [facility] CONSULT NOTE  Patient Care Team: Valora Agent, MD as PCP - General (Family Medicine) [name], RN as Oncology Nurse Navigator Kassie, Ozell SAUNDERS, MD as Consulting Physician (Urology) [name], MD as Consulting Physician (Oncology) [name], MD as Consulting Physician (Pulmonary Disease)  CHIEF COMPLAINTS/PURPOSE OF CONSULTATION: lung cancer    Oncology History Overview Note  AUG 2022- 8.2 x 5.9 cm spiculated mass noted in right upper lobe consistent with malignancy. It extends from the right hilum to the lateral chest wall and results in lytic destruction of the lateral portion of the right fourth rib.  Mediastinal lymph nodes are noted, including 12 mm right hilar lymph node, consistent with metastatic disease. 3 cm right adrenal mass is noted concerning for metastatic disease.  # AUG 2022- PET IMPRESSION: Peripherally hypermetabolic right upper lobe mass, likely due to central necrosis. Mass extends into the right hilum and right lateral chest wall involving the second through fourth right ribs.   Mildly enlarged and hypermetabolic right hilar lymph node.   No evidence of metastatic disease in the abdomen or pelvis.  # AUG, 2022-RIGHT CHEST WALL Bx-necrosis; suspicious for malignancy not definitive [discussed with Dr.Kraynie;MDT] LUNG MASS, RIGHT; BIOPSY:  - SUSPICIOUS FOR MALIGNANCY.  - SEE COMMENT.   Comment:  Approximately six tissue cores demonstrate inflamed fibrous capsule with  hemosiderin-laden macrophages, in a background of abundant necrosis.  One of the cores demonstrates a minute fragment of viable epithelial  cells.  These cells are positive for p40.  They are negative for TTF1  and CK7. The cells of interest disappear on deeper cut levels.  While  the findings are suspicious for squamous cell carcinoma, there is not  enough viable tumor for a definitive diagnosis.  Additional tissue  sampling may be helpful.    # SEP 2022-cycle #2 Taxol  hypersensitive reaction.  Switched over to #3 cycle- carbo-Abraxane  starting 06/29/2021.  Discontinued IMFINZI  therapy-December 2022 given poor tolerance/pneumonia admission to hospital.  #  keytruda  was in May-June 2023 x3 doses; stopped because of intolerance-fatigue patient preference/lack of progression.  # JAN 2024-bronchoscopy biopsy negative for malignancy; however PET scan concerning for recurrence/progression.    May 05, 2022-s/p bronchoscopy- with Dr.Aleskerov-significant purulent/necrotic debris noted. Dec 23rd, 2023- PET scan: The cavitary process in the right upper lobe is similar in size to previous CTS, although demonstrates prominent peripheral hypermetabolic activity. 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 Hypermetabolic activity within the adjacent upper right lateral chest wall with right 3rd through 5th rib deformities, suspicious for direct tumor spread. No evidence of distant osseous metastatic disease; Hypermetabolic left hilar lymph node worrisome for metastatic disease. No other hypermetabolic lymph nodes or distant metastases identified.  Bronchoscopy/EBUS [JAN 26th, 2024- Dr.A]- BAL/ 4R-lymph node FNA negative for malignancy.  Patient declined further invasive procedures.  # FEB 15t, 2024- RE-Start Keytruda .   # DEC 2022- s/p ID- Dr.ravishankar- ? Lung abscess-no antibiotics-  JAN 2023- CT scan incidental- cecal mass- colonoscopy[Dr.Russo; [facility]- high grade dysplasia]  S/p evaluation with Dr. Dessa abelson surgery for now]   # S/p evaluation with Dr. Gala.   Primary cancer of right upper lobe of lung (HCC)  06/03/2021 Initial Diagnosis   Primary cancer of right upper lobe of lung (HCC)   06/03/2021 Cancer Staging   Staging form: Lung, AJCC 8th Edition - Clinical: Stage IIIB (cT3, cN2, cM0) - Signed by  Abdoulaye Drum  R, MD on 06/03/2021   06/15/2021 - 08/03/2021 Chemotherapy   Patient is on Treatment Plan : LUNG Carboplatin  / Paclitaxel  + XRT q7d     10/02/2021 - 10/19/2021 Chemotherapy   Patient is on Treatment Plan : LUNG Durvalumab  q14d     02/03/2022 -  Chemotherapy   Patient is on Treatment Plan : LUNG NSCLC Pembrolizumab  (200) q21d     02/04/2022 - 03/18/2022 Chemotherapy   Patient is on Treatment Plan : LUNG NSCLC Pembrolizumab  (200) q21d      HISTORY OF PRESENTING ILLNESS: Patient is ambulating independently.  Accompanied by wife.  Rodney Vega 68 y.o.  male history of smoking -lung cancer [Limited necrotic tissue]-locally advanced unresectable- currently on surveillance [APRIL 2024-Keytruda  was discontinued because of poor tolerance/patient preference] ; and Cecal mass-high grade dysplasia intact- with progressive disease on the PET scan is here for a follow up.  Patient seems declining, difficulty standing now.   Patient notes to nail beds are cyanotic. No dyspnea noted.   Pt expereienced intense severe pain in Right thorax around his side and around to his back. Hospice nurse came out and instructed to start using liquid morphine  for the pain. More confusion.  Appetite is poor.  Positive for weight loss.  Review of Systems  Constitutional:  Positive for malaise/fatigue and weight loss. Negative for chills, diaphoresis and fever.  HENT:  Negative for nosebleeds and sore throat.   Eyes:  Negative for double vision.  Respiratory:  Positive for cough, sputum production and shortness of breath. Negative for hemoptysis and wheezing.   Cardiovascular:  Positive for chest pain. Negative for palpitations, orthopnea and leg swelling.  Gastrointestinal:  Positive for nausea. Negative for abdominal pain, blood in stool, constipation, diarrhea, heartburn, melena and vomiting.  Genitourinary:  Negative for dysuria, frequency and urgency.  Musculoskeletal:  Negative for back pain and  joint pain.  Skin: Negative.  Negative for itching and rash.  Neurological:  Positive for tingling. Negative for dizziness, focal weakness, weakness and headaches.  Endo/Heme/Allergies:  Does not bruise/bleed easily.  Psychiatric/Behavioral:  Negative for depression. The patient is not nervous/anxious and does not have insomnia.      MEDICAL HISTORY:  Past Medical History:  Diagnosis Date   Acute on chronic respiratory failure (HCC)    Anemia    Cancer (HCC)    Colostomy present (HCC)    COPD (chronic obstructive pulmonary disease) (HCC)    Degenerative arthritis of knee    Depression    Diabetes mellitus without complication (HCC)    Diverticulitis    Dyspnea    Glaucoma    since 2004   Hernia 1979   History of kidney stones    History of methicillin resistant staphylococcus aureus (MRSA)    Hypertension    Hypokalemia    Marijuana use    Mass of cecum    Nephrolithiasis    Neuromuscular disorder (HCC)    Personal history of tobacco use, presenting hazards to health    Pneumonia    Port-A-Cath in place    Pre-diabetes    Primary cancer of right upper lobe of lung (HCC)    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)    Screening for obesity    Severe sepsis (HCC)    Special screening for malignant neoplasms, colon    Tobacco use    Wears dentures    full upper and lower    SURGICAL HISTORY: Past Surgical History:  Procedure Laterality Date   BACK SURGERY  1994, 2012   lumbar bulging disc   BRONCHOSCOPY  05/05/2022   CATARACT EXTRACTION W/PHACO Left 01/20/2021   Procedure: CATARACT EXTRACTION PHACO AND INTRAOCULAR LENS PLACEMENT (IOC) LEFT;  Surgeon: Jaye Fallow, MD;  Location: [facility];  Service: Ophthalmology;  Laterality: Left;  10.13 0:52.1   COLONOSCOPY  [id number]   [facility]/Dr. Kalvin sessile polyp,13mm, found in rectum,multiple diverticula were found in the sigmoid, in descending and in transverse colon   COLONOSCOPY WITH PROPOFOL  N/A 10/22/2021    Procedure: COLONOSCOPY WITH PROPOFOL ;  Surgeon: Onita Elspeth Sharper, DO;  Location: [facility];  Service: Gastroenterology;  Laterality: N/A;  DM   COLOSTOMY  04/20/2013   EYE SURGERY     HERNIA REPAIR  1979   inguinal   IR IMAGING GUIDED PORT INSERTION  06/10/2021   JOINT REPLACEMENT Right    knee   KNEE ARTHROPLASTY Right 05/08/2018   Procedure: COMPUTER ASSISTED TOTAL KNEE ARTHROPLASTY;  Surgeon: Mardee Lynwood SQUIBB, MD;  Location: [facility];  Service: Orthopedics;  Laterality: Right;   KNEE ARTHROPLASTY Left 11/16/2019   Procedure: COMPUTER ASSISTED TOTAL KNEE ARTHROPLASTY;  Surgeon: Mardee Lynwood SQUIBB, MD;  Location: [facility];  Service: Orthopedics;  Laterality: Left;   LAPAROTOMY  04/20/2013   colon resection with colosotmy   LITHOTRIPSY  2013   LUNG BIOPSY  2023   RIGID BRONCHOSCOPY N/A 10/29/2022   Procedure: RIGID BRONCHOSCOPY;  Surgeon: [name], MD;  Location: [facility];  Service: Thoracic;  Laterality: N/A;   TONSILLECTOMY AND ADENOIDECTOMY  1962   VIDEO BRONCHOSCOPY WITH ENDOBRONCHIAL NAVIGATION N/A 05/05/2022   Procedure: VIDEO BRONCHOSCOPY WITH ENDOBRONCHIAL NAVIGATION;  Surgeon: [name], MD;  Location: [facility];  Service: Thoracic;  Laterality: N/A;   VIDEO BRONCHOSCOPY WITH ENDOBRONCHIAL ULTRASOUND N/A 05/05/2022   Procedure: VIDEO BRONCHOSCOPY WITH ENDOBRONCHIAL ULTRASOUND;  Surgeon: [name], MD;  Location: [facility];  Service: Thoracic;  Laterality: N/A;   VIDEO BRONCHOSCOPY WITH ENDOBRONCHIAL ULTRASOUND N/A 10/29/2022   Procedure: VIDEO BRONCHOSCOPY WITH ENDOBRONCHIAL ULTRASOUND;  Surgeon: [name], MD;  Location: [facility];  Service: Thoracic;  Laterality: N/A;    SOCIAL HISTORY: Social History   Socioeconomic History   Marital status: Married    Spouse name: Pegge   Number of children: Not on file   Years of education: Not on file   Highest education level: Not on file  Occupational History   Not on file  Tobacco Use   Smoking status:  Every Day    Current packs/day: 1.00    Average packs/day: 1 pack/day for 30.0 years (30.0 ttl pk-yrs)    Types: Cigarettes   Smokeless tobacco: Never   Tobacco comments:    since age 65 or 74  Vaping Use   Vaping status: Never Used  Substance and Sexual Activity   Alcohol use: Yes    Alcohol/week: 2.0 standard drinks of alcohol    Types: 2 Standard drinks or equivalent per week    Comment: rare   Drug use: Yes    Types: Marijuana    Comment: fentanyl  patch prescribed & oxycodone    Sexual activity: Not on file  Other Topics Concern   Not on file  Social History Narrative   [place]; smoking; not much alcohol. Worked for [employer], 2019. [employer]    Social Drivers of Corporate Investment Banker Strain: Low Risk  (07/20/2023)   Received from [facility]   Overall Financial Resource Strain (CARDIA)    Difficulty of Paying Living  Expenses: Not hard at all  Food Insecurity: No Food Insecurity (07/20/2023)   Received from [facility]   Hunger Vital Sign    Worried About Running Out of Food in the Last Year: Never true    Ran Out of Food in the Last Year: Never true  Transportation Needs: No Transportation Needs (07/20/2023)   Received from [facility] - Transportation    In the past 12 months, has lack of transportation kept you from medical appointments or from getting medications?: No    Lack of Transportation (Non-Medical): No  Physical Activity: Not on file  Stress: No Stress Concern Present (08/10/2022)   [employer] of Occupational Health - Occupational Stress Questionnaire    Feeling of Stress : Not at all  Social Connections: Unknown (08/10/2022)   Social Connection and Isolation Panel [NHANES]    Frequency of Communication with Friends and Family: Three times a week    Frequency of Social Gatherings with Friends and Family: Twice a week    Attends Religious Services: Patient  declined    Database Administrator or Organizations: No    Attends Banker Meetings: Never    Marital Status: Married  Catering Manager Violence: Not At Risk (01/20/2023)   Humiliation, Afraid, Rape, and Kick questionnaire    Fear of Current or Ex-Partner: No    Emotionally Abused: No    Physically Abused: No    Sexually Abused: No    FAMILY HISTORY: Family History  Problem Relation Age of Onset   Diabetes Mother    Heart disease Mother    Heart attack Father    Prostate cancer Father     ALLERGIES:  is allergic to paclitaxel , ambien [zolpidem], nsaids, and aleve [naproxen sodium].  MEDICATIONS:  Current Outpatient Medications  Medication Sig Dispense Refill   Accu-Chek Softclix Lancets lancets USE AS DIRECTED FOUR TIMES DAILY 100 each 12   acetaminophen  (TYLENOL ) 500 MG tablet Take 2 tablets (1,000 mg total) by mouth daily as needed. Home med. 30 tablet 0   albuterol  (PROVENTIL ) (2.5 MG/3ML) 0.083% nebulizer solution Take 2.5 mg by nebulization every 4 (four) hours as needed for wheezing or shortness of breath.     albuterol  (VENTOLIN  HFA) 108 (90 Base) MCG/ACT inhaler SMARTSIG:2 inhalation Via Inhaler Every 6 Hours PRN 1 each 2   blood glucose meter kit and supplies KIT Check your blood glucose levels twice a day; once in the morning before breakfast; and once in the evening after dinner. 1 each 0   BREZTRI AEROSPHERE 160-9-4.8 MCG/ACT AERO Inhale 2 puffs into the lungs 2 (two) times daily.     dorzolamide -timolol  (COSOPT ) 22.3-6.8 MG/ML ophthalmic solution Place 1 drop into both eyes 2 (two) times daily.     eszopiclone  (LUNESTA ) 1 MG TABS tablet TAKE 1 TABLET(1 MG) BY MOUTH AT BEDTIME AS NEEDED FOR SLEEP 30 tablet 3   glucose blood test strip Check your blood glucose levels twice a day; once in the morning before breakfast; and once in the evening after dinner. 100 each 12   ipratropium-albuterol  (DUONEB) 0.5-2.5 (3) MG/3ML SOLN Take 3 mLs by nebulization every 6  (six) hours as needed. 360 mL 1   latanoprost  (XALATAN ) 0.005 % ophthalmic solution Place 1 drop into both eyes at bedtime.     lidocaine -prilocaine  (EMLA ) cream Apply 1 Application topically as needed. 30 g 3   montelukast  (SINGULAIR ) 10 MG tablet TAKE 1 TABLET(10 MG) BY MOUTH AT BEDTIME  90 tablet 0   Multiple Vitamin (MULTIVITAMIN WITH MINERALS) TABS tablet Take 1 tablet by mouth daily.     nystatin  (MYCOSTATIN ) 100000 UNIT/ML suspension Take 5 mLs (500,000 Units total) by mouth 4 (four) times daily. 120 mL 0   propranolol  ER (INDERAL  LA) 60 MG 24 hr capsule TAKE 1 CAPSULE(60 MG) BY MOUTH DAILY 30 capsule 3   Respiratory Therapy Supplies (NEBULIZER) DEVI Use as directed 1 each 0   tamsulosin  (FLOMAX ) 0.4 MG CAPS capsule Take 0.4 mg by mouth daily after supper.     triamcinolone  ointment (KENALOG ) 0.5 % Apply 1 Application topically 2 (two) times daily. 30 g 1   venlafaxine  XR (EFFEXOR -XR) 75 MG 24 hr capsule Take 225 mg by mouth daily with breakfast.     chlorpheniramine-HYDROcodone (TUSSIONEX) 10-8 MG/5ML Take 5 mLs by mouth every 12 (twelve) hours as needed for cough. (Patient not taking: Reported on 10/18/2023) 473 mL 0   dextromethorphan -guaiFENesin  (MUCINEX  DM) 30-600 MG 12hr tablet Take 1 tablet by mouth 2 (two) times daily as needed for cough. (Patient not taking: Reported on 10/18/2023)     fentaNYL  (DURAGESIC ) 50 MCG/HR Place 1 patch onto the skin every 3 (three) days. 10 patch 0   glipiZIDE  (GLUCOTROL ) 5 MG tablet TAKE 1 TABLET(5 MG) BY MOUTH DAILY BEFORE BREAKFAST (Patient not taking: Reported on 05/17/2023) 90 tablet 2   hydrOXYzine  (ATARAX ) 10 MG tablet Take 1 tablet (10 mg total) by mouth at bedtime as needed. (Patient not taking: Reported on 10/18/2023) 30 tablet 3   metFORMIN  (GLUCOPHAGE ) 500 MG tablet Take 1 tablet (500 mg total) by mouth 2 (two) times daily with a meal. (Patient not taking: Reported on 05/17/2023) 60 tablet 2   naloxone (NARCAN) nasal spray 4 mg/0.1 mL Place 1 spray  into the nose once. (Patient not taking: Reported on 05/17/2023)     oxyCODONE  (OXY IR/ROXICODONE ) 5 MG immediate release tablet Take 1-2 tablets (5-10 mg total) by mouth every 4 (four) hours as needed for severe pain (pain score 7-10). Take 1-2 pills every 4- 6 hours as needed for moderate to severe pain 90 tablet 0   potassium chloride  SA (KLOR-CON  M) 20 MEQ tablet TAKE 1 TABLET(20 MEQ) BY MOUTH DAILY (Patient not taking: Reported on 10/18/2023) 30 tablet 1   potassium chloride  SA (KLOR-CON  M) 20 MEQ tablet TAKE 1 TABLET(20 MEQ) BY MOUTH DAILY (Patient not taking: Reported on 10/18/2023) 30 tablet 1   potassium chloride  SA (KLOR-CON  M) 20 MEQ tablet TAKE 1 TABLET(20 MEQ) BY MOUTH DAILY (Patient not taking: Reported on 10/18/2023) 30 tablet 1   predniSONE  (DELTASONE ) 20 MG tablet Take 1 tablet (20 mg total) by mouth daily with breakfast. 30 tablet 1   pregabalin  (LYRICA ) 100 MG capsule Take 1 capsule (100 mg total) by mouth 2 (two) times daily. 60 capsule 2   prochlorperazine  (COMPAZINE ) 10 MG tablet TAKE 1 TABLET(10 MG) BY MOUTH EVERY 6 HOURS AS NEEDED FOR NAUSEA OR VOMITING (Patient not taking: Reported on 10/18/2023) 30 tablet 1   No current facility-administered medications for this visit.   Facility-Administered Medications Ordered in Other Visits  Medication Dose Route Frequency Provider Last Rate Last Admin   heparin  lock flush 100 UNIT/ML injection            heparin  lock flush 100 UNIT/ML injection            heparin  lock flush 100 UNIT/ML injection             PHYSICAL EXAMINATION:  ECOG PERFORMANCE STATUS: 1 - Symptomatic but completely ambulatory  Vitals:   10/18/23 0948  BP: 113/75  Pulse: 66  Resp: 18  Temp: 98 F (36.7 C)  SpO2: 98%     Filed Weights   10/18/23 0948  Weight: 143 lb 11.2 oz (65.2 kg)   Oral Thrush   Physical Exam Vitals and nursing note reviewed.  HENT:     Head: Normocephalic and atraumatic.     Mouth/Throat:     Pharynx: Oropharynx is clear.   Eyes:     Extraocular Movements: Extraocular movements intact.     Pupils: Pupils are equal, round, and reactive to light.  Cardiovascular:     Rate and Rhythm: Normal rate and regular rhythm.  Pulmonary:     Comments: Decreased breath sounds bilaterally.  Abdominal:     Palpations: Abdomen is soft.  Musculoskeletal:        General: Normal range of motion.     Cervical back: Normal range of motion.  Skin:    General: Skin is warm.  Neurological:     General: No focal deficit present.     Mental Status: He is alert and oriented to person, place, and time.  Psychiatric:        Behavior: Behavior normal.        Judgment: Judgment normal.    LABORATORY DATA:  I have reviewed the data as listed Lab Results  Component Value Date   WBC 12.5 (H) 10/18/2023   HGB 12.0 (L) 10/18/2023   HCT 36.7 (L) 10/18/2023   MCV 88.6 10/18/2023   PLT 200 10/18/2023   Recent Labs    08/19/23 0937 09/13/23 0929 10/18/23 0931  NA 136 140 137  K 3.3* 3.5 3.4*  CL 99 108 98  CO2 25 26 27   GLUCOSE 236* 180* 226*  BUN 14 10 13   CREATININE 1.16 0.95 1.01  CALCIUM 8.8* 7.9* 8.9  GFRNONAA >60 >60 >60  PROT 6.6 5.9* 6.8  ALBUMIN 3.4* 2.9* 2.9*  AST 17 17 32  ALT 15 12 24   ALKPHOS 104 84 110  BILITOT 0.3 0.4 0.6    RADIOGRAPHIC STUDIES: I have personally reviewed the radiological images as listed and agreed with the findings in the report. No results found.  ASSESSMENT & PLAN:   Primary cancer of right upper lobe of lung (HCC) # UNRESECTABLE/LOCALLY ADVANCED- Right upper lobe lung mass -concerning for malignancy [biopsy inconclusive/atypical cells]-  Currently OFF therapy/on surveillance [given poor tolerance to Keytruda ].  Unfortunately NOV 12th, PET scan:  Increased hypermetabolic activity associated with the RIGHT upper lobe cavitary process. Intense hypermetabolic activity along the RIGHT lateral chest wall with evidence of chest wall invasion and erosion of multiple RIGHT lateral  ribs. Findings consistent with local progression of lung carcinoma. New hypermetabolic prevascular mediastinal lymph nodes consistent with nodal metastasis. New hypermetabolic sternal metastasis. Similar hypermetabolic LEFT suprahilar mass consistent metastatic disease; Slight increased size of hypermetabolic mass at the cecum.  # patient currently on hospice-pain poorly controlled.  However for now patient wants to continue follow-up in the clinic.  Continue surveillance OFF chemotherapy.   #Right chest wall pain-/pleuritic-secondary to progressive malignancy- worsening.  Increase fentanyl  to 50 mcg and also increase oxycodone  5 mg 1 to 2 pills every 4-6 hours.   Add lyrica  - discussed with [name] borders/hospice.   # COPD/fatigue- [coughing/phlegm/ no fevers]- on albuterol /advair [smoking]- TSH- WNL;Continue  Xopenex /ipratropium nebs-; Mucinex - DM BID-continue prednisone  - stable.   # Oral Thrush- start nysatin.   #  Hypokalemia-3.4 - continue potassium 20 mEq liquid.-Hypocalemia: 8.4/albumin- 2.10 December 2022-vit D 105- HOLD off -Vit D 50,K. Stable.   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 Dessa [Dr.[facility]] PET scan slight progression asymptomatic at this time continue monitoring on hospice.   # Labile Blood glucose- BG as low as 25; taken off glipizide -  Metformin  [PCP]- 228-stable.  Monitor closely on steroids prednisone  20 mg a day.  # Ongoing fatigue: Poor appetite continue prednisone  20 mg a day.   # IV mediport: functioning/stable.   # Prognosis:   *appt thru mychart   # DISPOSITION: # De-access-NO IVFs or KCL # follow up in 5  weeks-Tuesday- MD; labs- cbc/cmp; possible  IVFs-1 lit /1 hour;KCL 20 IV-  Dr.B     All questions were answered. The patient knows to call the clinic with any problems, questions or concerns.    Cindy JONELLE Joe,  MD 10/18/2023 11:12 AM

## 2023-10-18 NOTE — Progress Notes (Signed)
 Palliative Medicine [facility]  [phone] [phone]  Patient Care Team: Valora Agent, MD as PCP - General (Family Medicine) [name], RN as Oncology Nurse Navigator Kassie Ozell SAUNDERS, MD as Consulting Physician (Urology) [name], MD as Consulting Physician (Oncology) [name], MD as Consulting Physician (Pulmonary Disease)   Name of the patient: Rodney Vega  [id number]  12/04/1955   Date of visit: 10/18/23  Reason for Consult: Rodney Vega is a 68 year old man with multiple medical problems including recurrent unresectable locally advanced non-small cell lung cancer status post chemotherapy and radiation.  Most recently on surveillance given poor tolerance for immunotherapy.  PET scan on 08/16/2023 unfortunately showed progression of right upper lobe mass with invasion and lateral chest wall and ribs in addition to new hypermetabolic mediastinal lymph nodes and sternal metastasis. Then transitioned to hospice.    PAST MEDICAL HISTORY: Past Medical History:  Diagnosis Date   Acute on chronic respiratory failure (HCC)    Anemia    Cancer (HCC)    Colostomy present (HCC)    COPD (chronic obstructive pulmonary disease) (HCC)    Degenerative arthritis of knee    Depression    Diabetes mellitus without complication (HCC)    Diverticulitis    Dyspnea    Glaucoma    since 2004   Hernia 1979   History of kidney stones    History of methicillin resistant staphylococcus aureus (MRSA)    Hypertension    Hypokalemia    Marijuana use    Mass of cecum    Nephrolithiasis    Neuromuscular disorder (HCC)    Personal history of tobacco use, presenting hazards to health    Pneumonia    Port-A-Cath in place    Pre-diabetes    Primary cancer of right upper lobe of lung (HCC)    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)    Screening for obesity    Severe sepsis (HCC)    Special screening for malignant neoplasms,  colon    Tobacco use    Wears dentures    full upper and lower    PAST SURGICAL HISTORY:  Past Surgical History:  Procedure Laterality Date   BACK SURGERY  1994, 2012   lumbar bulging disc   BRONCHOSCOPY  05/05/2022   CATARACT EXTRACTION W/PHACO Left 01/20/2021   Procedure: CATARACT EXTRACTION PHACO AND INTRAOCULAR LENS PLACEMENT (IOC) LEFT;  Surgeon: Jaye Fallow, MD;  Location: [facility];  Service: Ophthalmology;  Laterality: Left;  10.13 0:52.1   COLONOSCOPY  [id number]   [facility]/Dr. Kalvin sessile polyp,52mm, found in rectum,multiple diverticula were found in the sigmoid, in descending and in transverse colon   COLONOSCOPY WITH PROPOFOL  N/A 10/22/2021   Procedure: COLONOSCOPY WITH PROPOFOL ;  Surgeon: Onita Elspeth Ozell, DO;  Location: [facility] END[facility];  Service: Gastroenterology;  Laterality: N/A;  DM   COLOSTOMY  04/20/2013   EYE SURGERY     HERNIA REPAIR  1979   inguinal   IR IMAGING GUIDED PORT INSERTION  06/10/2021   JOINT REPLACEMENT Right    knee   KNEE ARTHROPLASTY Right 05/08/2018   Procedure: COMPUTER ASSISTED TOTAL KNEE ARTHROPLASTY;  Surgeon: Mardee Agent SQUIBB, MD;  Location: [facility];  Service: Orthopedics;  Laterality: Right;   KNEE ARTHROPLASTY Left 11/16/2019   Procedure: COMPUTER ASSISTED TOTAL KNEE ARTHROPLASTY;  Surgeon: Mardee Agent SQUIBB, MD;  Location: [facility];  Service: Orthopedics;  Laterality: Left;   LAPAROTOMY  04/20/2013   colon resection with colosotmy  LITHOTRIPSY  2013   LUNG BIOPSY  2023   RIGID BRONCHOSCOPY N/A 10/29/2022   Procedure: RIGID BRONCHOSCOPY;  Surgeon: [name], MD;  Location: [facility];  Service: Thoracic;  Laterality: N/A;   TONSILLECTOMY AND ADENOIDECTOMY  1962   VIDEO BRONCHOSCOPY WITH ENDOBRONCHIAL NAVIGATION N/A 05/05/2022   Procedure: VIDEO BRONCHOSCOPY WITH ENDOBRONCHIAL NAVIGATION;  Surgeon: [name], MD;  Location: [facility];  Service: Thoracic;  Laterality: N/A;   VIDEO BRONCHOSCOPY WITH ENDOBRONCHIAL  ULTRASOUND N/A 05/05/2022   Procedure: VIDEO BRONCHOSCOPY WITH ENDOBRONCHIAL ULTRASOUND;  Surgeon: [name], MD;  Location: [facility];  Service: Thoracic;  Laterality: N/A;   VIDEO BRONCHOSCOPY WITH ENDOBRONCHIAL ULTRASOUND N/A 10/29/2022   Procedure: VIDEO BRONCHOSCOPY WITH ENDOBRONCHIAL ULTRASOUND;  Surgeon: [name], MD;  Location: [facility];  Service: Thoracic;  Laterality: N/A;    HEMATOLOGY/ONCOLOGY HISTORY:  Oncology History Overview Note  AUG 2022- 8.2 x 5.9 cm spiculated mass noted in right upper lobe consistent with malignancy. It extends from the right hilum to the lateral chest wall and results in lytic destruction of the lateral portion of the right fourth rib.  Mediastinal lymph nodes are noted, including 12 mm right hilar lymph node, consistent with metastatic disease. 3 cm right adrenal mass is noted concerning for metastatic disease.  # AUG 2022- PET IMPRESSION: Peripherally hypermetabolic right upper lobe mass, likely due to central necrosis. Mass extends into the right hilum and right lateral chest wall involving the second through fourth right ribs.   Mildly enlarged and hypermetabolic right hilar lymph node.   No evidence of metastatic disease in the abdomen or pelvis.  # AUG, 2022-RIGHT CHEST WALL Bx-necrosis; suspicious for malignancy not definitive [discussed with Dr.Kraynie;MDT] LUNG MASS, RIGHT; BIOPSY:  - SUSPICIOUS FOR MALIGNANCY.  - SEE COMMENT.   Comment:  Approximately six tissue cores demonstrate inflamed fibrous capsule with  hemosiderin-laden macrophages, in a background of abundant necrosis.  One of the cores demonstrates a minute fragment of viable epithelial  cells.  These cells are positive for p40.  They are negative for TTF1  and CK7. The cells of interest disappear on deeper cut levels.  While  the findings are suspicious for squamous cell carcinoma, there is not  enough viable tumor for a definitive diagnosis.  Additional tissue   sampling may be helpful.   # SEP 2022-cycle #2 Taxol  hypersensitive reaction.  Switched over to #3 cycle- carbo-Abraxane  starting 06/29/2021.  Discontinued IMFINZI  therapy-December 2022 given poor tolerance/pneumonia admission to hospital.  #  keytruda  was in May-June 2023 x3 doses; stopped because of intolerance-fatigue patient preference/lack of progression.  # JAN 2024-bronchoscopy biopsy negative for malignancy; however PET scan concerning for recurrence/progression.    May 05, 2022-s/p bronchoscopy- with Dr.Aleskerov-significant purulent/necrotic debris noted. Dec 23rd, 2023- PET scan: The cavitary process in the right upper lobe is similar in size to previous CTS, although demonstrates prominent peripheral hypermetabolic activity. 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 Hypermetabolic activity within the adjacent upper right lateral chest wall with right 3rd through 5th rib deformities, suspicious for direct tumor spread. No evidence of distant osseous metastatic disease; Hypermetabolic left hilar lymph node worrisome for metastatic disease. No other hypermetabolic lymph nodes or distant metastases identified.  Bronchoscopy/EBUS [JAN 26th, 2024- Dr.A]- BAL/ 4R-lymph node FNA negative for malignancy.  Patient declined further invasive procedures.  # FEB 15t, 2024- RE-Start Keytruda .   # DEC 2022- s/p ID- Dr.ravishankar- ? Lung  abscess-no antibiotics-  JAN 2023- CT scan incidental- cecal mass- colonoscopy[Dr.Russo; [facility]- high grade dysplasia]  S/p evaluation with Dr. Dessa abelson surgery for now]   # S/p evaluation with Dr. Gala.   Primary cancer of right upper lobe of lung (HCC)  06/03/2021 Initial Diagnosis   Primary cancer of right upper lobe of lung (HCC)   06/03/2021 Cancer Staging   Staging form: Lung, AJCC 8th Edition - Clinical: Stage IIIB (cT3,  cN2, cM0) - Signed by [name], MD on 06/03/2021   06/15/2021 - 08/03/2021 Chemotherapy   Patient is on Treatment Plan : LUNG Carboplatin  / Paclitaxel  + XRT q7d     10/02/2021 - 10/19/2021 Chemotherapy   Patient is on Treatment Plan : LUNG Durvalumab  q14d     02/03/2022 -  Chemotherapy   Patient is on Treatment Plan : LUNG NSCLC Pembrolizumab  (200) q21d     02/04/2022 - 03/18/2022 Chemotherapy   Patient is on Treatment Plan : LUNG NSCLC Pembrolizumab  (200) q21d       ALLERGIES:  is allergic to paclitaxel , ambien [zolpidem], nsaids, and aleve [naproxen sodium].  MEDICATIONS:  Current Outpatient Medications  Medication Sig Dispense Refill   Accu-Chek Softclix Lancets lancets USE AS DIRECTED FOUR TIMES DAILY 100 each 12   acetaminophen  (TYLENOL ) 500 MG tablet Take 2 tablets (1,000 mg total) by mouth daily as needed. Home med. 30 tablet 0   albuterol  (PROVENTIL ) (2.5 MG/3ML) 0.083% nebulizer solution Take 2.5 mg by nebulization every 4 (four) hours as needed for wheezing or shortness of breath.     albuterol  (VENTOLIN  HFA) 108 (90 Base) MCG/ACT inhaler SMARTSIG:2 inhalation Via Inhaler Every 6 Hours PRN 1 each 2   blood glucose meter kit and supplies KIT Check your blood glucose levels twice a day; once in the morning before breakfast; and once in the evening after dinner. 1 each 0   BREZTRI AEROSPHERE 160-9-4.8 MCG/ACT AERO Inhale 2 puffs into the lungs 2 (two) times daily.     chlorpheniramine-HYDROcodone (TUSSIONEX) 10-8 MG/5ML Take 5 mLs by mouth every 12 (twelve) hours as needed for cough. (Patient not taking: Reported on 10/18/2023) 473 mL 0   dextromethorphan -guaiFENesin  (MUCINEX  DM) 30-600 MG 12hr tablet Take 1 tablet by mouth 2 (two) times daily as needed for cough. (Patient not taking: Reported on 10/18/2023)     dorzolamide -timolol  (COSOPT ) 22.3-6.8 MG/ML ophthalmic solution Place 1 drop into both eyes 2 (two) times daily.     eszopiclone  (LUNESTA ) 1 MG TABS tablet TAKE 1  TABLET(1 MG) BY MOUTH AT BEDTIME AS NEEDED FOR SLEEP 30 tablet 3   fentaNYL  (DURAGESIC ) 50 MCG/HR Place 1 patch onto the skin every 3 (three) days. 10 patch 0   glipiZIDE  (GLUCOTROL ) 5 MG tablet TAKE 1 TABLET(5 MG) BY MOUTH DAILY BEFORE BREAKFAST (Patient not taking: Reported on 05/17/2023) 90 tablet 2   glucose blood test strip Check your blood glucose levels twice a day; once in the morning before breakfast; and once in the evening after dinner. 100 each 12   hydrOXYzine  (ATARAX ) 10 MG tablet Take 1 tablet (10 mg total) by mouth at bedtime as needed. (Patient not taking: Reported on 10/18/2023) 30 tablet 3   ipratropium-albuterol  (DUONEB) 0.5-2.5 (3) MG/3ML SOLN Take 3 mLs by nebulization every 6 (six) hours as needed. 360 mL 1   latanoprost  (XALATAN ) 0.005 % ophthalmic solution Place 1 drop into both eyes at bedtime.     lidocaine -prilocaine  (EMLA ) cream Apply 1 Application topically as needed. 30 g 3  metFORMIN  (GLUCOPHAGE ) 500 MG tablet Take 1 tablet (500 mg total) by mouth 2 (two) times daily with a meal. (Patient not taking: Reported on 05/17/2023) 60 tablet 2   montelukast  (SINGULAIR ) 10 MG tablet TAKE 1 TABLET(10 MG) BY MOUTH AT BEDTIME 90 tablet 0   Multiple Vitamin (MULTIVITAMIN WITH MINERALS) TABS tablet Take 1 tablet by mouth daily.     naloxone (NARCAN) nasal spray 4 mg/0.1 mL Place 1 spray into the nose once. (Patient not taking: Reported on 05/17/2023)     oxyCODONE  (OXY IR/ROXICODONE ) 5 MG immediate release tablet Take 1 tablet (5 mg total) by mouth every 4 (four) hours as needed for severe pain (pain score 7-10). Take 1-2 pills every 4- 6 hours as needed for moderate to severe pain 90 tablet 0   potassium chloride  SA (KLOR-CON  M) 20 MEQ tablet TAKE 1 TABLET(20 MEQ) BY MOUTH DAILY (Patient not taking: Reported on 10/18/2023) 30 tablet 1   potassium chloride  SA (KLOR-CON  M) 20 MEQ tablet TAKE 1 TABLET(20 MEQ) BY MOUTH DAILY (Patient not taking: Reported on 10/18/2023) 30 tablet 1    potassium chloride  SA (KLOR-CON  M) 20 MEQ tablet TAKE 1 TABLET(20 MEQ) BY MOUTH DAILY (Patient not taking: Reported on 10/18/2023) 30 tablet 1   predniSONE  (DELTASONE ) 20 MG tablet Take 1 tablet (20 mg total) by mouth daily with breakfast. 30 tablet 0   prochlorperazine  (COMPAZINE ) 10 MG tablet TAKE 1 TABLET(10 MG) BY MOUTH EVERY 6 HOURS AS NEEDED FOR NAUSEA OR VOMITING (Patient not taking: Reported on 10/18/2023) 30 tablet 1   propranolol  ER (INDERAL  LA) 60 MG 24 hr capsule TAKE 1 CAPSULE(60 MG) BY MOUTH DAILY 30 capsule 3   Respiratory Therapy Supplies (NEBULIZER) DEVI Use as directed 1 each 0   tamsulosin  (FLOMAX ) 0.4 MG CAPS capsule Take 0.4 mg by mouth daily after supper.     triamcinolone  ointment (KENALOG ) 0.5 % Apply 1 Application topically 2 (two) times daily. 30 g 1   venlafaxine  XR (EFFEXOR -XR) 75 MG 24 hr capsule Take 225 mg by mouth daily with breakfast.     No current facility-administered medications for this visit.   Facility-Administered Medications Ordered in Other Visits  Medication Dose Route Frequency Provider Last Rate Last Admin   heparin  lock flush 100 UNIT/ML injection            heparin  lock flush 100 UNIT/ML injection            heparin  lock flush 100 UNIT/ML injection             VITAL SIGNS: There were no vitals taken for this visit. There were no vitals filed for this visit.    Estimated body mass index is 20.04 kg/m as calculated from the following:   Height as of 09/13/23: 5' 11 (1.803 m).   Weight as of an earlier encounter on 10/18/23: 143 lb 11.2 oz (65.2 kg).  LABS: CBC:    Component Value Date/Time   WBC 12.5 (H) 10/18/2023 0931   WBC 7.1 01/25/2023 0939   HGB 12.0 (L) 10/18/2023 0931   HGB 7.5 (L) 12/01/2021 0504   HCT 36.7 (L) 10/18/2023 0931   HCT 23.7 (L) 12/01/2021 0504   PLT 200 10/18/2023 0931   PLT 243 12/01/2021 0504   MCV 88.6 10/18/2023 0931   MCV 89 12/01/2021 0504   MCV 91 09/21/2013 0522   NEUTROABS 10.4 (H) 10/18/2023 0931    NEUTROABS 9.6 (H) 12/01/2021 0504   NEUTROABS 11.3 (H) 09/21/2013 0522  LYMPHSABS 1.0 10/18/2023 0931   LYMPHSABS 0.8 12/01/2021 0504   LYMPHSABS 1.4 09/21/2013 0522   MONOABS 0.8 10/18/2023 0931   MONOABS 0.9 09/21/2013 0522   EOSABS 0.2 10/18/2023 0931   EOSABS 0.3 12/01/2021 0504   EOSABS 0.0 09/21/2013 0522   BASOSABS 0.1 10/18/2023 0931   BASOSABS 0.0 12/01/2021 0504   BASOSABS 0.1 09/21/2013 0522   Comprehensive Metabolic Panel:    Component Value Date/Time   NA 137 10/18/2023 0931   NA 138 09/20/2013 0639   K 3.4 (L) 10/18/2023 0931   K 4.0 09/20/2013 0639   CL 98 10/18/2023 0931   CL 107 09/20/2013 0639   CO2 27 10/18/2023 0931   CO2 26 09/20/2013 0639   BUN 13 10/18/2023 0931   BUN 12 09/20/2013 0639   CREATININE 1.01 10/18/2023 0931   CREATININE 0.84 09/24/2013 0515   GLUCOSE 226 (H) 10/18/2023 0931   GLUCOSE 160 (H) 09/20/2013 0639   CALCIUM 8.9 10/18/2023 0931   CALCIUM 8.2 (L) 09/20/2013 0639   AST 32 10/18/2023 0931   ALT 24 10/18/2023 0931   ALT 20 01/08/2012 0939   ALKPHOS 110 10/18/2023 0931   ALKPHOS 69 01/08/2012 0939   BILITOT 0.6 10/18/2023 0931   PROT 6.8 10/18/2023 0931   PROT 6.5 09/12/2013 1002   PROT 6.9 01/08/2012 0939   ALBUMIN 2.9 (L) 10/18/2023 0931   ALBUMIN 4.4 09/12/2013 1002   ALBUMIN 3.5 01/08/2012 0939    RADIOGRAPHIC STUDIES: No results found.  PERFORMANCE STATUS (ECOG) : 1 - Symptomatic but completely ambulatory  Review of Systems Unless otherwise noted, a complete review of systems is negative.  Physical Exam General: NAD Extremities: no edema, no joint deformities Skin: no rashes Neurological: Grossly nonfocal  Assessment:   Follow-up visit.  Patient has been on hospice care at home.    Patient had episode of worsening pain over the weekend.  Reportedly, contacted hospice and had dose of oxycodone  increased.  Additionally, patient ran out of Lyrica  and started taking his wife's Lyrica  (100 mg versus patient's  50 mg dose).  However, found that the higher dose helped improve the pain.  Reportedly, patient tolerated well.  Both patient and wife denied any adverse effects.  Will plan to refill Lyrica  at 100 mg dose.  Will also send refills for oxycodone  and fentanyl  today.  Plan: -Best supportive care -Followed by hospice at home -Refill fentanyl /oxycodone  -Increase Lyrica  100 mg twice daily -Daily bowel regimen -Follow-up visit 1-2 months  Case and plan discussed with Dr. Rennie  Patient expressed understanding and was in agreement with this plan. He also understands that He can call clinic at any time with any questions, concerns, or complaints.   Thank you for allowing me to participate in the care of this very pleasant patient.   Time Total: 15 minutes  Visit consisted of counseling and education dealing with the complex and emotionally intense issues of symptom management in the setting of serious illness.Greater than 50%  of this time was spent counseling and coordinating care related to the above assessment and plan.  Signed by: Fonda Mower, PhD, NP-C

## 2023-10-18 NOTE — Progress Notes (Signed)
 Pt declining, difficulty standing now. Nail beds are cyanotic. No dyspnea noted. Pt expereienced intense severe pain in Right thorax around his side and around to his back. Hospice nurse came out and instructed to start using liquid morphine  for the pain. More confusion.

## 2023-10-31 ENCOUNTER — Telehealth: Payer: Self-pay | Admitting: Hospice and Palliative Medicine

## 2023-10-31 NOTE — Telephone Encounter (Signed)
This patients wife called and would like to speak to you. The hospice nurse came Friday and says he declined a lot in 1 week. She says she has a n appointment with Dr. Smith Robert on Friday and would like to see you after that. Please let me know how I can assist with getting him scheduled.

## 2023-11-02 ENCOUNTER — Encounter: Payer: Self-pay | Admitting: Internal Medicine

## 2023-11-04 ENCOUNTER — Inpatient Hospital Stay (HOSPITAL_BASED_OUTPATIENT_CLINIC_OR_DEPARTMENT_OTHER): Admitting: Hospice and Palliative Medicine

## 2023-11-04 ENCOUNTER — Encounter: Payer: Self-pay | Admitting: Hospice and Palliative Medicine

## 2023-11-04 VITALS — BP 108/64 | HR 74 | Temp 96.5°F | Wt 141.8 lb

## 2023-11-04 DIAGNOSIS — C3411 Malignant neoplasm of upper lobe, right bronchus or lung: Secondary | ICD-10-CM | POA: Diagnosis not present

## 2023-11-04 DIAGNOSIS — Z515 Encounter for palliative care: Secondary | ICD-10-CM

## 2023-11-04 DIAGNOSIS — C7951 Secondary malignant neoplasm of bone: Secondary | ICD-10-CM | POA: Diagnosis not present

## 2023-11-04 NOTE — Progress Notes (Signed)
Palliative Medicine [facility]  Telephone:([phone] [phone]  Patient Care Team: [name], MD as PCP - General (Family Medicine) [name], RN as Oncology Nurse Navigator Orson Ape, MD as Consulting Physician (Urology) [name], MD as Consulting Physician (Oncology) [name], MD as Consulting Physician (Pulmonary Disease)   Name of the patient: Rodney Vega  [id number]  [birth year]   Date of visit: 11/04/23  Reason for Consult: Keyvon Herter is a 68 year old man with multiple medical problems including recurrent unresectable locally advanced non-small cell lung cancer status post chemotherapy and radiation.  Most recently on surveillance given poor tolerance for immunotherapy.  PET scan on 08/16/2023 unfortunately showed progression of right upper lobe mass with invasion and lateral chest wall and ribs in addition to new hypermetabolic mediastinal lymph nodes and sternal metastasis. Then transitioned to hospice.    PAST MEDICAL HISTORY: Past Medical History:  Diagnosis Date   Acute on chronic respiratory failure (HCC)    Anemia    Cancer (HCC)    Colostomy present (HCC)    COPD (chronic obstructive pulmonary disease) (HCC)    Degenerative arthritis of knee    Depression    Diabetes mellitus without complication (HCC)    Diverticulitis    Dyspnea    Glaucoma    since 2004   Hernia 1979   History of kidney stones    History of methicillin resistant staphylococcus aureus (MRSA)    Hypertension    Hypokalemia    Marijuana use    Mass of cecum    Nephrolithiasis    Neuromuscular disorder (HCC)    Personal history of tobacco use, presenting hazards to health    Pneumonia    Port-A-Cath in place    Pre-diabetes    Primary cancer of right upper lobe of lung (HCC)    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)    Screening for obesity    Severe sepsis (HCC)    Special screening for malignant neoplasms,  colon    Tobacco use    Wears dentures    full upper and lower    PAST SURGICAL HISTORY:  Past Surgical History:  Procedure Laterality Date   BACK SURGERY  1994, 2012   lumbar bulging disc   BRONCHOSCOPY  05/05/2022   CATARACT EXTRACTION W/PHACO Left 01/20/2021   Procedure: CATARACT EXTRACTION PHACO AND INTRAOCULAR LENS PLACEMENT (IOC) LEFT;  Surgeon: Galen Manila, MD;  Location: [facility];  Service: Ophthalmology;  Laterality: Left;  10.13 0:52.1   COLONOSCOPY  2841,3244   [facility]/Dr. Carmell Austria sessile polyp,49mm, found in rectum,multiple diverticula were found in the sigmoid, in descending and in transverse colon   COLONOSCOPY WITH PROPOFOL N/A 10/22/2021   Procedure: COLONOSCOPY WITH PROPOFOL;  Surgeon: Jaynie Collins, DO;  Location: [facility]COPY;  Service: Gastroenterology;  Laterality: N/A;  DM   COLOSTOMY  04/20/2013   EYE SURGERY     HERNIA REPAIR  1979   inguinal   IR IMAGING GUIDED PORT INSERTION  06/10/2021   JOINT REPLACEMENT Right    knee   KNEE ARTHROPLASTY Right 05/08/2018   Procedure: COMPUTER ASSISTED TOTAL KNEE ARTHROPLASTY;  Surgeon: Donato Heinz, MD;  Location: [facility];  Service: Orthopedics;  Laterality: Right;   KNEE ARTHROPLASTY Left 11/16/2019   Procedure: COMPUTER ASSISTED TOTAL KNEE ARTHROPLASTY;  Surgeon: Donato Heinz, MD;  Location: [facility];  Service: Orthopedics;  Laterality: Left;   LAPAROTOMY  04/20/2013   colon resection with colosotmy  LITHOTRIPSY  2013   LUNG BIOPSY  2023   RIGID BRONCHOSCOPY N/A 10/29/2022   Procedure: RIGID BRONCHOSCOPY;  Surgeon: [name], MD;  Location: [facility];  Service: Thoracic;  Laterality: N/A;   TONSILLECTOMY AND ADENOIDECTOMY  1962   VIDEO BRONCHOSCOPY WITH ENDOBRONCHIAL NAVIGATION N/A 05/05/2022   Procedure: VIDEO BRONCHOSCOPY WITH ENDOBRONCHIAL NAVIGATION;  Surgeon: [name], MD;  Location: [facility];  Service: Thoracic;  Laterality: N/A;   VIDEO BRONCHOSCOPY WITH ENDOBRONCHIAL  ULTRASOUND N/A 05/05/2022   Procedure: VIDEO BRONCHOSCOPY WITH ENDOBRONCHIAL ULTRASOUND;  Surgeon: [name], MD;  Location: [facility];  Service: Thoracic;  Laterality: N/A;   VIDEO BRONCHOSCOPY WITH ENDOBRONCHIAL ULTRASOUND N/A 10/29/2022   Procedure: VIDEO BRONCHOSCOPY WITH ENDOBRONCHIAL ULTRASOUND;  Surgeon: [name], MD;  Location: [facility];  Service: Thoracic;  Laterality: N/A;    HEMATOLOGY/ONCOLOGY HISTORY:  Oncology History Overview Note  AUG 2022- 8.2 x 5.9 cm spiculated mass noted in right upper lobe consistent with malignancy. It extends from the right hilum to the lateral chest wall and results in lytic destruction of the lateral portion of the right fourth rib.  Mediastinal lymph nodes are noted, including 12 mm right hilar lymph node, consistent with metastatic disease. 3 cm right adrenal mass is noted concerning for metastatic disease.  # AUG 2022- PET IMPRESSION: Peripherally hypermetabolic right upper lobe mass, likely due to central necrosis. Mass extends into the right hilum and right lateral chest wall involving the second through fourth right ribs.   Mildly enlarged and hypermetabolic right hilar lymph node.   No evidence of metastatic disease in the abdomen or pelvis.  # AUG, 2022-RIGHT CHEST WALL Bx-necrosis; suspicious for malignancy not definitive [discussed with Dr.Kraynie;MDT] LUNG MASS, RIGHT; BIOPSY:  - SUSPICIOUS FOR MALIGNANCY.  - SEE COMMENT.   Comment:  Approximately six tissue cores demonstrate inflamed fibrous capsule with  hemosiderin-laden macrophages, in a background of abundant necrosis.  One of the cores demonstrates a minute fragment of viable epithelial  cells.  These cells are positive for p40.  They are negative for TTF1  and CK7. The cells of interest disappear on deeper cut levels.  While  the findings are suspicious for squamous cell carcinoma, there is not  enough viable tumor for a definitive diagnosis.  Additional tissue   sampling may be helpful.   # SEP 2022-cycle #2 Taxol hypersensitive reaction.  Switched over to #3 cycle- carbo-Abraxane starting 06/29/2021.  Discontinued IMFINZI therapy-December 2022 given poor tolerance/pneumonia admission to hospital.  #  [name] was in May-June 2023 x3 doses; stopped because of intolerance-fatigue patient preference/lack of progression.  # JAN 2024-bronchoscopy biopsy negative for malignancy; however PET scan concerning for recurrence/progression.    May 05, 2022-s/p bronchoscopy- with Dr.Aleskerov-significant purulent/necrotic debris noted. Dec 23rd, 2023- PET scan: The cavitary process in the right upper lobe is similar in size to previous CTS, although demonstrates prominent peripheral hypermetabolic activity. 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 Hypermetabolic activity within the adjacent upper right lateral chest wall with right 3rd through 5th rib deformities, suspicious for direct tumor spread. No evidence of distant osseous metastatic disease; Hypermetabolic left hilar lymph node worrisome for metastatic disease. No other hypermetabolic lymph nodes or distant metastases identified.  Bronchoscopy/EBUS [JAN 26th, 2024- Dr.A]- BAL/ 4R-lymph node FNA negative for malignancy.  Patient declined further invasive procedures.  # FEB 15t, 2024- RE-Start Keytruda.   # DEC 2022- s/p ID- Dr.ravishankar- ? Lung  abscess-no antibiotics-  JAN 2023- CT scan incidental- cecal mass- colonoscopy[Dr.Russo; [facility]- high grade dysplasia]  S/p evaluation with Dr. Sherryle Lis surgery for now]   # S/p evaluation with Dr. Sheppard Penton.   Primary cancer of right upper lobe of lung (HCC)  06/03/2021 Initial Diagnosis   Primary cancer of right upper lobe of lung (HCC)   06/03/2021 Cancer Staging   Staging form: Lung, AJCC 8th Edition - Clinical: Stage IIIB (cT3,  cN2, cM0) - Signed by [name], MD on 06/03/2021   06/15/2021 - 08/03/2021 Chemotherapy   Patient is on Treatment Plan : LUNG Carboplatin / Paclitaxel + XRT q7d     10/02/2021 - 10/19/2021 Chemotherapy   Patient is on Treatment Plan : LUNG Durvalumab q14d     02/03/2022 -  Chemotherapy   Patient is on Treatment Plan : LUNG NSCLC Pembrolizumab (200) q21d     02/04/2022 - 03/18/2022 Chemotherapy   Patient is on Treatment Plan : LUNG NSCLC Pembrolizumab (200) q21d       ALLERGIES:  is allergic to paclitaxel, ambien [zolpidem], nsaids, and aleve [naproxen sodium].  MEDICATIONS:  Current Outpatient Medications  Medication Sig Dispense Refill   Accu-Chek Softclix Lancets lancets USE AS DIRECTED FOUR TIMES DAILY 100 each 12   acetaminophen (TYLENOL) 500 MG tablet Take 2 tablets (1,000 mg total) by mouth daily as needed. Home med. 30 tablet 0   albuterol (PROVENTIL) (2.5 MG/3ML) 0.083% nebulizer solution Take 2.5 mg by nebulization every 4 (four) hours as needed for wheezing or shortness of breath.     albuterol (VENTOLIN HFA) 108 (90 Base) MCG/ACT inhaler SMARTSIG:2 inhalation Via Inhaler Every 6 Hours PRN 1 each 2   blood glucose meter kit and supplies KIT Check your blood glucose levels twice a day; once in the morning before breakfast; and once in the evening after dinner. 1 each 0   BREZTRI AEROSPHERE 160-9-4.8 MCG/ACT AERO Inhale 2 puffs into the lungs 2 (two) times daily.     chlorpheniramine-HYDROcodone (TUSSIONEX) 10-8 MG/5ML Take 5 mLs by mouth every 12 (twelve) hours as needed for cough. (Patient not taking: Reported on 11/04/2023) 473 mL 0   dextromethorphan-guaiFENesin (MUCINEX DM) 30-600 MG 12hr tablet Take 1 tablet by mouth 2 (two) times daily as needed for cough. (Patient not taking: Reported on 10/18/2023)     dorzolamide-timolol (COSOPT) 22.3-6.8 MG/ML ophthalmic solution Place 1 drop into both eyes 2 (two) times daily.     eszopiclone (LUNESTA) 1 MG TABS tablet TAKE 1  TABLET(1 MG) BY MOUTH AT BEDTIME AS NEEDED FOR SLEEP 30 tablet 3   fentaNYL (DURAGESIC) 50 MCG/HR Place 1 patch onto the skin every 3 (three) days. 10 patch 0   glipiZIDE (GLUCOTROL) 5 MG tablet TAKE 1 TABLET(5 MG) BY MOUTH DAILY BEFORE BREAKFAST (Patient not taking: No sig reported) 90 tablet 2   glucose blood test strip Check your blood glucose levels twice a day; once in the morning before breakfast; and once in the evening after dinner. 100 each 12   hydrOXYzine (ATARAX) 10 MG tablet Take 1 tablet (10 mg total) by mouth at bedtime as needed. (Patient not taking: Reported on 11/04/2023) 30 tablet 3   ipratropium-albuterol (DUONEB) 0.5-2.5 (3) MG/3ML SOLN Take 3 mLs by nebulization every 6 (six) hours as needed. 360 mL 1   latanoprost (XALATAN) 0.005 % ophthalmic solution Place 1 drop into both eyes at bedtime.     lidocaine-prilocaine (EMLA) cream Apply 1 Application topically as needed. 30 g 3  metFORMIN (GLUCOPHAGE) 500 MG tablet Take 1 tablet (500 mg total) by mouth 2 (two) times daily with a meal. (Patient not taking: Reported on 05/17/2023) 60 tablet 2   montelukast (SINGULAIR) 10 MG tablet TAKE 1 TABLET(10 MG) BY MOUTH AT BEDTIME 90 tablet 0   Multiple Vitamin (MULTIVITAMIN WITH MINERALS) TABS tablet Take 1 tablet by mouth daily.     naloxone (NARCAN) nasal spray 4 mg/0.1 mL Place 1 spray into the nose once. (Patient not taking: Reported on 05/17/2023)     nystatin (MYCOSTATIN) 100000 UNIT/ML suspension Take 5 mLs (500,000 Units total) by mouth 4 (four) times daily. 120 mL 0   oxyCODONE (OXY IR/ROXICODONE) 5 MG immediate release tablet Take 1-2 tablets (5-10 mg total) by mouth every 4 (four) hours as needed for severe pain (pain score 7-10). Take 1-2 pills every 4- 6 hours as needed for moderate to severe pain 90 tablet 0   potassium chloride SA (KLOR-CON M) 20 MEQ tablet TAKE 1 TABLET(20 MEQ) BY MOUTH DAILY (Patient not taking: Reported on 11/04/2023) 30 tablet 1   potassium chloride SA  (KLOR-CON M) 20 MEQ tablet TAKE 1 TABLET(20 MEQ) BY MOUTH DAILY (Patient not taking: Reported on 11/04/2023) 30 tablet 1   potassium chloride SA (KLOR-CON M) 20 MEQ tablet TAKE 1 TABLET(20 MEQ) BY MOUTH DAILY (Patient not taking: Reported on 11/04/2023) 30 tablet 1   predniSONE (DELTASONE) 20 MG tablet Take 1 tablet (20 mg total) by mouth daily with breakfast. 30 tablet 1   pregabalin (LYRICA) 100 MG capsule Take 1 capsule (100 mg total) by mouth 2 (two) times daily. 60 capsule 2   prochlorperazine (COMPAZINE) 10 MG tablet TAKE 1 TABLET(10 MG) BY MOUTH EVERY 6 HOURS AS NEEDED FOR NAUSEA OR VOMITING (Patient not taking: Reported on 11/04/2023) 30 tablet 1   propranolol ER (INDERAL LA) 60 MG 24 hr capsule TAKE 1 CAPSULE(60 MG) BY MOUTH DAILY 30 capsule 3   Respiratory Therapy Supplies (NEBULIZER) DEVI Use as directed 1 each 0   tamsulosin (FLOMAX) 0.4 MG CAPS capsule Take 0.4 mg by mouth daily after supper.     triamcinolone ointment (KENALOG) 0.5 % Apply 1 Application topically 2 (two) times daily. 30 g 1   venlafaxine XR (EFFEXOR-XR) 75 MG 24 hr capsule Take 225 mg by mouth daily with breakfast.     No current facility-administered medications for this visit.   Facility-Administered Medications Ordered in Other Visits  Medication Dose Route Frequency Provider Last Rate Last Admin   heparin lock flush 100 UNIT/ML injection            heparin lock flush 100 UNIT/ML injection            heparin lock flush 100 UNIT/ML injection             VITAL SIGNS: BP 108/64 (BP Location: Left Arm, Patient Position: Sitting, Cuff Size: Normal)   Pulse 74   Temp (!) 96.5 F (35.8 C) (Tympanic)   Wt 141 lb 12.8 oz (64.3 kg)   SpO2 97%   BMI 19.78 kg/m  Filed Weights   11/04/23 1509  Weight: 141 lb 12.8 oz (64.3 kg)      Estimated body mass index is 19.78 kg/m as calculated from the following:   Height as of 09/13/23: 5\' 11"  (1.803 m).   Weight as of this encounter: 141 lb 12.8 oz (64.3  kg).  LABS: CBC:    Component Value Date/Time   WBC 12.5 (H) 10/18/2023 5784  WBC 7.1 01/25/2023 0939   HGB 12.0 (L) 10/18/2023 0931   HGB 7.5 (L) 12/01/2021 0504   HCT 36.7 (L) 10/18/2023 0931   HCT 23.7 (L) 12/01/2021 0504   PLT 200 10/18/2023 0931   PLT 243 12/01/2021 0504   MCV 88.6 10/18/2023 0931   MCV 89 12/01/2021 0504   MCV 91 09/21/2013 0522   NEUTROABS 10.4 (H) 10/18/2023 0931   NEUTROABS 9.6 (H) 12/01/2021 0504   NEUTROABS 11.3 (H) 09/21/2013 0522   LYMPHSABS 1.0 10/18/2023 0931   LYMPHSABS 0.8 12/01/2021 0504   LYMPHSABS 1.4 09/21/2013 0522   MONOABS 0.8 10/18/2023 0931   MONOABS 0.9 09/21/2013 0522   EOSABS 0.2 10/18/2023 0931   EOSABS 0.3 12/01/2021 0504   EOSABS 0.0 09/21/2013 0522   BASOSABS 0.1 10/18/2023 0931   BASOSABS 0.0 12/01/2021 0504   BASOSABS 0.1 09/21/2013 0522   Comprehensive Metabolic Panel:    Component Value Date/Time   NA 137 10/18/2023 0931   NA 138 09/20/2013 0639   K 3.4 (L) 10/18/2023 0931   K 4.0 09/20/2013 0639   CL 98 10/18/2023 0931   CL 107 09/20/2013 0639   CO2 27 10/18/2023 0931   CO2 26 09/20/2013 0639   BUN 13 10/18/2023 0931   BUN 12 09/20/2013 0639   CREATININE 1.01 10/18/2023 0931   CREATININE 0.84 09/24/2013 0515   GLUCOSE 226 (H) 10/18/2023 0931   GLUCOSE 160 (H) 09/20/2013 0639   CALCIUM 8.9 10/18/2023 0931   CALCIUM 8.2 (L) 09/20/2013 0639   AST 32 10/18/2023 0931   ALT 24 10/18/2023 0931   ALT 20 01/08/2012 0939   ALKPHOS 110 10/18/2023 0931   ALKPHOS 69 01/08/2012 0939   BILITOT 0.6 10/18/2023 0931   PROT 6.8 10/18/2023 0931   PROT 6.5 09/12/2013 1002   PROT 6.9 01/08/2012 0939   ALBUMIN 2.9 (L) 10/18/2023 0931   ALBUMIN 4.4 09/12/2013 1002   ALBUMIN 3.5 01/08/2012 0939    RADIOGRAPHIC STUDIES: No results found.  PERFORMANCE STATUS (ECOG) : 1 - Symptomatic but completely ambulatory  Review of Systems Unless otherwise noted, a complete review of systems is negative.  Physical Exam General:  NAD Extremities: no edema, no joint deformities Skin: no rashes Neurological: Grossly nonfocal  Assessment:   Follow-up visit.  Patient accompanied by his wife.  Patient requested visit today as he wished to discuss end-of-life care and prognosis.  He wanted to know my thoughts on his life expectancy.  We discussed this in detail today.  Patient admits to recent decline.  He is more weak and continues to have minimal oral intake.  He is sleeping more often during the day.  We discussed quality of life and comfort and any goals he would like to achieve prior to his passing.  Emotional support provided the patient and family.   Plan: -Best supportive care -Followed by hospice at home -Continue fentanyl/oxycodone -Continue Lyrica 100 mg twice daily -Daily bowel regimen -Follow-up visit 1 months   Patient expressed understanding and was in agreement with this plan. He also understands that He can call clinic at any time with any questions, concerns, or complaints.   Thank you for allowing me to participate in the care of this very pleasant patient.   Time Total: 15 minutes  Visit consisted of counseling and education dealing with the complex and emotionally intense issues of symptom management in the setting of serious illness.Greater than 50%  of this time was spent counseling and coordinating care related to the  above assessment and plan.  Signed by: Laurette Schimke, PhD, NP-C

## 2023-11-10 ENCOUNTER — Telehealth: Payer: Self-pay | Admitting: *Deleted

## 2023-11-10 ENCOUNTER — Other Ambulatory Visit: Payer: Self-pay | Admitting: Hospice and Palliative Medicine

## 2023-11-10 MED ORDER — LORAZEPAM 0.5 MG PO TABS: 0.5000 mg | ORAL_TABLET | ORAL | 0 refills | Status: DC | PRN

## 2023-11-10 MED ORDER — MORPHINE SULFATE (CONCENTRATE) 10 MG /0.5 ML PO SOLN
5.0000 mg | ORAL | 0 refills | Status: DC | PRN
Start: 2023-11-10 — End: 2023-11-14

## 2023-11-10 NOTE — Telephone Encounter (Signed)
 Tiffany was calling from palliative care and wanted to speak with Rodney Vega with the patient Rodney Vega who has changing in his condition number to call [phone]

## 2023-11-10 NOTE — Progress Notes (Signed)
 I received a call from hospice nurse, Aletha Anderson. Reportedly, patient is declining and having more pain/shortness of breath. Will rotate to morphine  elixir and start on prn lorazepam . Discussed possible utilization of inpatient hospice if needed.

## 2023-11-10 NOTE — Telephone Encounter (Signed)
 I spoke with her. Orders given.

## 2023-11-22 ENCOUNTER — Encounter: Payer: Medicare PPO | Admitting: Hospice and Palliative Medicine

## 2023-11-22 ENCOUNTER — Other Ambulatory Visit: Payer: Medicare PPO

## 2023-11-22 ENCOUNTER — Ambulatory Visit: Payer: Medicare PPO

## 2023-11-22 ENCOUNTER — Ambulatory Visit: Payer: Medicare PPO | Admitting: Internal Medicine

## 2023-12-03 DEATH — deceased
